# Patient Record
Sex: Male | Born: 1935
Health system: Southern US, Community
[De-identification: ages and names within clinical notes are randomized; demographics above are authoritative.]

## PROBLEM LIST (undated history)

## (undated) DIAGNOSIS — K4090 Unilateral inguinal hernia, without obstruction or gangrene, not specified as recurrent: Secondary | ICD-10-CM

## (undated) DIAGNOSIS — R0600 Dyspnea, unspecified: Secondary | ICD-10-CM

## (undated) DIAGNOSIS — Z8673 Personal history of transient ischemic attack (TIA), and cerebral infarction without residual deficits: Secondary | ICD-10-CM

## (undated) DIAGNOSIS — M353 Polymyalgia rheumatica: Secondary | ICD-10-CM

## (undated) DIAGNOSIS — K219 Gastro-esophageal reflux disease without esophagitis: Secondary | ICD-10-CM

## (undated) DIAGNOSIS — L605 Yellow nail syndrome: Secondary | ICD-10-CM

## (undated) DIAGNOSIS — K635 Polyp of colon: Secondary | ICD-10-CM

## (undated) DIAGNOSIS — I509 Heart failure, unspecified: Secondary | ICD-10-CM

## (undated) DIAGNOSIS — B029 Zoster without complications: Secondary | ICD-10-CM

## (undated) DIAGNOSIS — J3089 Other allergic rhinitis: Secondary | ICD-10-CM

## (undated) DIAGNOSIS — H359 Unspecified retinal disorder: Secondary | ICD-10-CM

## (undated) DIAGNOSIS — Z973 Presence of spectacles and contact lenses: Secondary | ICD-10-CM

## (undated) DIAGNOSIS — M316 Other giant cell arteritis: Secondary | ICD-10-CM

## (undated) DIAGNOSIS — M5416 Radiculopathy, lumbar region: Secondary | ICD-10-CM

## (undated) DIAGNOSIS — M1990 Unspecified osteoarthritis, unspecified site: Secondary | ICD-10-CM

## (undated) DIAGNOSIS — R519 Headache, unspecified: Secondary | ICD-10-CM

## (undated) DIAGNOSIS — R51 Headache: Secondary | ICD-10-CM

## (undated) DIAGNOSIS — J90 Pleural effusion, not elsewhere classified: Secondary | ICD-10-CM

## (undated) DIAGNOSIS — J329 Chronic sinusitis, unspecified: Secondary | ICD-10-CM

## (undated) DIAGNOSIS — L570 Actinic keratosis: Secondary | ICD-10-CM

## (undated) DIAGNOSIS — R609 Edema, unspecified: Secondary | ICD-10-CM

## (undated) DIAGNOSIS — Z87438 Personal history of other diseases of male genital organs: Secondary | ICD-10-CM

## (undated) DIAGNOSIS — H469 Unspecified optic neuritis: Secondary | ICD-10-CM

## (undated) DIAGNOSIS — G2581 Restless legs syndrome: Secondary | ICD-10-CM

## (undated) DIAGNOSIS — Z23 Encounter for immunization: Secondary | ICD-10-CM

## (undated) HISTORY — DX: Polymyalgia rheumatica: M35.3

## (undated) HISTORY — DX: Radiculopathy, lumbar region: M54.16

## (undated) HISTORY — DX: Zoster without complications: B02.9

## (undated) HISTORY — PX: VASECTOMY: SHX75

## (undated) HISTORY — DX: Other allergic rhinitis: J30.89

## (undated) HISTORY — DX: Actinic keratosis: L57.0

## (undated) HISTORY — DX: Dyspnea, unspecified: R06.00

## (undated) HISTORY — DX: Personal history of transient ischemic attack (TIA), and cerebral infarction without residual deficits: Z86.73

## (undated) HISTORY — DX: Yellow nail syndrome: L60.5

## (undated) HISTORY — DX: Unspecified osteoarthritis, unspecified site: M19.90

## (undated) HISTORY — DX: Unspecified retinal disorder: H35.9

## (undated) HISTORY — PX: OTHER SURGICAL HISTORY: SHX169

## (undated) HISTORY — DX: Gastro-esophageal reflux disease without esophagitis: K21.9

## (undated) HISTORY — DX: Restless legs syndrome: G25.81

## (undated) HISTORY — DX: Polyp of colon: K63.5

## (undated) HISTORY — PX: COLONOSCOPY: SHX174

## (undated) HISTORY — DX: Personal history of other diseases of male genital organs: Z87.438

## (undated) HISTORY — DX: Encounter for immunization: Z23

## (undated) HISTORY — DX: Edema, unspecified: R60.9

---
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
## 2005-04-10 ENCOUNTER — Ambulatory Visit (HOSPITAL_COMMUNITY): Admission: RE | Admit: 2005-04-10 | Discharge: 2005-04-10 | Payer: Self-pay | Admitting: Gastroenterology

## 2006-02-04 ENCOUNTER — Encounter: Admission: RE | Admit: 2006-02-04 | Discharge: 2006-02-04 | Payer: Self-pay | Admitting: Internal Medicine

## 2006-10-31 ENCOUNTER — Emergency Department (HOSPITAL_COMMUNITY): Admission: EM | Admit: 2006-10-31 | Discharge: 2006-11-01 | Payer: Self-pay | Admitting: Emergency Medicine

## 2006-11-01 ENCOUNTER — Ambulatory Visit: Admission: RE | Admit: 2006-11-01 | Discharge: 2006-11-01 | Payer: Self-pay | Admitting: Internal Medicine

## 2006-11-01 ENCOUNTER — Ambulatory Visit: Payer: Self-pay | Admitting: Vascular Surgery

## 2006-11-03 ENCOUNTER — Encounter: Admission: RE | Admit: 2006-11-03 | Discharge: 2006-11-03 | Payer: Self-pay | Admitting: Internal Medicine

## 2010-12-12 ENCOUNTER — Other Ambulatory Visit: Payer: Self-pay | Admitting: Gastroenterology

## 2011-11-30 DIAGNOSIS — H31009 Unspecified chorioretinal scars, unspecified eye: Secondary | ICD-10-CM | POA: Diagnosis not present

## 2011-11-30 DIAGNOSIS — H04129 Dry eye syndrome of unspecified lacrimal gland: Secondary | ICD-10-CM | POA: Diagnosis not present

## 2011-11-30 DIAGNOSIS — H02429 Myogenic ptosis of unspecified eyelid: Secondary | ICD-10-CM | POA: Diagnosis not present

## 2012-01-22 DIAGNOSIS — H02409 Unspecified ptosis of unspecified eyelid: Secondary | ICD-10-CM | POA: Diagnosis not present

## 2012-01-22 DIAGNOSIS — Z01810 Encounter for preprocedural cardiovascular examination: Secondary | ICD-10-CM | POA: Diagnosis not present

## 2012-02-05 DIAGNOSIS — H02839 Dermatochalasis of unspecified eye, unspecified eyelid: Secondary | ICD-10-CM | POA: Diagnosis not present

## 2012-02-05 DIAGNOSIS — H02429 Myogenic ptosis of unspecified eyelid: Secondary | ICD-10-CM | POA: Diagnosis not present

## 2012-05-15 DIAGNOSIS — Z23 Encounter for immunization: Secondary | ICD-10-CM | POA: Diagnosis not present

## 2012-06-22 DIAGNOSIS — M5430 Sciatica, unspecified side: Secondary | ICD-10-CM | POA: Diagnosis not present

## 2012-06-22 DIAGNOSIS — J069 Acute upper respiratory infection, unspecified: Secondary | ICD-10-CM | POA: Diagnosis not present

## 2012-07-08 DIAGNOSIS — R059 Cough, unspecified: Secondary | ICD-10-CM | POA: Diagnosis not present

## 2012-07-08 DIAGNOSIS — IMO0001 Reserved for inherently not codable concepts without codable children: Secondary | ICD-10-CM | POA: Diagnosis not present

## 2012-07-08 DIAGNOSIS — R05 Cough: Secondary | ICD-10-CM | POA: Diagnosis not present

## 2012-08-04 DIAGNOSIS — M353 Polymyalgia rheumatica: Secondary | ICD-10-CM | POA: Diagnosis not present

## 2012-08-04 DIAGNOSIS — R05 Cough: Secondary | ICD-10-CM | POA: Diagnosis not present

## 2012-08-04 DIAGNOSIS — R059 Cough, unspecified: Secondary | ICD-10-CM | POA: Diagnosis not present

## 2012-09-15 DIAGNOSIS — J310 Chronic rhinitis: Secondary | ICD-10-CM | POA: Diagnosis not present

## 2012-09-15 DIAGNOSIS — R05 Cough: Secondary | ICD-10-CM | POA: Diagnosis not present

## 2012-09-15 DIAGNOSIS — R059 Cough, unspecified: Secondary | ICD-10-CM | POA: Diagnosis not present

## 2012-09-15 DIAGNOSIS — M353 Polymyalgia rheumatica: Secondary | ICD-10-CM | POA: Diagnosis not present

## 2012-11-10 DIAGNOSIS — M353 Polymyalgia rheumatica: Secondary | ICD-10-CM | POA: Diagnosis not present

## 2012-11-10 DIAGNOSIS — R05 Cough: Secondary | ICD-10-CM | POA: Diagnosis not present

## 2012-11-10 DIAGNOSIS — R5381 Other malaise: Secondary | ICD-10-CM | POA: Diagnosis not present

## 2012-11-10 DIAGNOSIS — J3489 Other specified disorders of nose and nasal sinuses: Secondary | ICD-10-CM | POA: Diagnosis not present

## 2012-11-10 DIAGNOSIS — R059 Cough, unspecified: Secondary | ICD-10-CM | POA: Diagnosis not present

## 2012-11-25 DIAGNOSIS — J309 Allergic rhinitis, unspecified: Secondary | ICD-10-CM | POA: Diagnosis not present

## 2012-11-25 DIAGNOSIS — R499 Unspecified voice and resonance disorder: Secondary | ICD-10-CM | POA: Diagnosis not present

## 2012-11-25 DIAGNOSIS — J329 Chronic sinusitis, unspecified: Secondary | ICD-10-CM | POA: Diagnosis not present

## 2012-12-10 DIAGNOSIS — J329 Chronic sinusitis, unspecified: Secondary | ICD-10-CM | POA: Diagnosis not present

## 2012-12-10 DIAGNOSIS — R079 Chest pain, unspecified: Secondary | ICD-10-CM | POA: Diagnosis not present

## 2012-12-10 DIAGNOSIS — R791 Abnormal coagulation profile: Secondary | ICD-10-CM | POA: Diagnosis not present

## 2012-12-12 DIAGNOSIS — H31009 Unspecified chorioretinal scars, unspecified eye: Secondary | ICD-10-CM | POA: Diagnosis not present

## 2012-12-12 DIAGNOSIS — H04129 Dry eye syndrome of unspecified lacrimal gland: Secondary | ICD-10-CM | POA: Diagnosis not present

## 2012-12-12 DIAGNOSIS — H2510 Age-related nuclear cataract, unspecified eye: Secondary | ICD-10-CM | POA: Diagnosis not present

## 2012-12-22 DIAGNOSIS — J329 Chronic sinusitis, unspecified: Secondary | ICD-10-CM | POA: Diagnosis not present

## 2012-12-22 DIAGNOSIS — M353 Polymyalgia rheumatica: Secondary | ICD-10-CM | POA: Diagnosis not present

## 2012-12-31 DIAGNOSIS — R5381 Other malaise: Secondary | ICD-10-CM | POA: Diagnosis not present

## 2012-12-31 DIAGNOSIS — J329 Chronic sinusitis, unspecified: Secondary | ICD-10-CM | POA: Diagnosis not present

## 2012-12-31 DIAGNOSIS — IMO0002 Reserved for concepts with insufficient information to code with codable children: Secondary | ICD-10-CM | POA: Diagnosis not present

## 2012-12-31 DIAGNOSIS — R5383 Other fatigue: Secondary | ICD-10-CM | POA: Diagnosis not present

## 2012-12-31 DIAGNOSIS — M353 Polymyalgia rheumatica: Secondary | ICD-10-CM | POA: Diagnosis not present

## 2013-01-11 DIAGNOSIS — J019 Acute sinusitis, unspecified: Secondary | ICD-10-CM | POA: Diagnosis not present

## 2013-02-05 DIAGNOSIS — R0602 Shortness of breath: Secondary | ICD-10-CM | POA: Diagnosis not present

## 2013-02-05 DIAGNOSIS — R609 Edema, unspecified: Secondary | ICD-10-CM | POA: Diagnosis not present

## 2013-03-04 DIAGNOSIS — R5383 Other fatigue: Secondary | ICD-10-CM | POA: Diagnosis not present

## 2013-03-04 DIAGNOSIS — M353 Polymyalgia rheumatica: Secondary | ICD-10-CM | POA: Diagnosis not present

## 2013-03-04 DIAGNOSIS — M2550 Pain in unspecified joint: Secondary | ICD-10-CM | POA: Diagnosis not present

## 2013-03-04 DIAGNOSIS — IMO0002 Reserved for concepts with insufficient information to code with codable children: Secondary | ICD-10-CM | POA: Diagnosis not present

## 2013-03-04 DIAGNOSIS — R5381 Other malaise: Secondary | ICD-10-CM | POA: Diagnosis not present

## 2013-04-14 DIAGNOSIS — J310 Chronic rhinitis: Secondary | ICD-10-CM | POA: Diagnosis not present

## 2013-04-14 DIAGNOSIS — E7800 Pure hypercholesterolemia, unspecified: Secondary | ICD-10-CM | POA: Diagnosis not present

## 2013-04-14 DIAGNOSIS — R609 Edema, unspecified: Secondary | ICD-10-CM | POA: Diagnosis not present

## 2013-04-18 DIAGNOSIS — S40019A Contusion of unspecified shoulder, initial encounter: Secondary | ICD-10-CM | POA: Diagnosis not present

## 2013-04-18 DIAGNOSIS — M542 Cervicalgia: Secondary | ICD-10-CM | POA: Diagnosis not present

## 2013-04-18 DIAGNOSIS — M25519 Pain in unspecified shoulder: Secondary | ICD-10-CM | POA: Diagnosis not present

## 2013-04-18 DIAGNOSIS — Z8673 Personal history of transient ischemic attack (TIA), and cerebral infarction without residual deficits: Secondary | ICD-10-CM | POA: Diagnosis not present

## 2013-04-18 DIAGNOSIS — S5010XA Contusion of unspecified forearm, initial encounter: Secondary | ICD-10-CM | POA: Diagnosis not present

## 2013-04-18 DIAGNOSIS — Z7901 Long term (current) use of anticoagulants: Secondary | ICD-10-CM | POA: Diagnosis not present

## 2013-04-18 DIAGNOSIS — IMO0002 Reserved for concepts with insufficient information to code with codable children: Secondary | ICD-10-CM | POA: Diagnosis not present

## 2013-04-18 DIAGNOSIS — S0510XA Contusion of eyeball and orbital tissues, unspecified eye, initial encounter: Secondary | ICD-10-CM | POA: Diagnosis not present

## 2013-04-18 DIAGNOSIS — R918 Other nonspecific abnormal finding of lung field: Secondary | ICD-10-CM | POA: Diagnosis not present

## 2013-04-18 DIAGNOSIS — S61209A Unspecified open wound of unspecified finger without damage to nail, initial encounter: Secondary | ICD-10-CM | POA: Diagnosis not present

## 2013-04-18 DIAGNOSIS — W108XXA Fall (on) (from) other stairs and steps, initial encounter: Secondary | ICD-10-CM | POA: Diagnosis not present

## 2013-04-18 DIAGNOSIS — T1490XA Injury, unspecified, initial encounter: Secondary | ICD-10-CM | POA: Diagnosis not present

## 2013-04-18 DIAGNOSIS — K449 Diaphragmatic hernia without obstruction or gangrene: Secondary | ICD-10-CM | POA: Diagnosis not present

## 2013-04-18 DIAGNOSIS — S0990XA Unspecified injury of head, initial encounter: Secondary | ICD-10-CM | POA: Diagnosis not present

## 2013-05-04 DIAGNOSIS — IMO0002 Reserved for concepts with insufficient information to code with codable children: Secondary | ICD-10-CM | POA: Diagnosis not present

## 2013-05-04 DIAGNOSIS — M353 Polymyalgia rheumatica: Secondary | ICD-10-CM | POA: Diagnosis not present

## 2013-05-04 DIAGNOSIS — R5381 Other malaise: Secondary | ICD-10-CM | POA: Diagnosis not present

## 2013-05-04 DIAGNOSIS — M2550 Pain in unspecified joint: Secondary | ICD-10-CM | POA: Diagnosis not present

## 2013-05-07 DIAGNOSIS — Z4802 Encounter for removal of sutures: Secondary | ICD-10-CM | POA: Diagnosis not present

## 2013-05-13 DIAGNOSIS — Z23 Encounter for immunization: Secondary | ICD-10-CM | POA: Diagnosis not present

## 2013-05-24 DIAGNOSIS — J019 Acute sinusitis, unspecified: Secondary | ICD-10-CM | POA: Diagnosis not present

## 2013-06-22 ENCOUNTER — Other Ambulatory Visit: Payer: Self-pay | Admitting: Internal Medicine

## 2013-06-22 DIAGNOSIS — M353 Polymyalgia rheumatica: Secondary | ICD-10-CM | POA: Diagnosis not present

## 2013-06-22 DIAGNOSIS — I6789 Other cerebrovascular disease: Secondary | ICD-10-CM | POA: Diagnosis not present

## 2013-06-22 DIAGNOSIS — K219 Gastro-esophageal reflux disease without esophagitis: Secondary | ICD-10-CM | POA: Diagnosis not present

## 2013-06-22 DIAGNOSIS — Z79899 Other long term (current) drug therapy: Secondary | ICD-10-CM | POA: Diagnosis not present

## 2013-06-22 DIAGNOSIS — R5381 Other malaise: Secondary | ICD-10-CM | POA: Diagnosis not present

## 2013-06-22 DIAGNOSIS — R05 Cough: Secondary | ICD-10-CM | POA: Diagnosis not present

## 2013-06-22 DIAGNOSIS — R911 Solitary pulmonary nodule: Secondary | ICD-10-CM | POA: Diagnosis not present

## 2013-06-22 DIAGNOSIS — Z0000 Encounter for general adult medical examination without abnormal findings: Secondary | ICD-10-CM | POA: Diagnosis not present

## 2013-06-22 DIAGNOSIS — E7800 Pure hypercholesterolemia, unspecified: Secondary | ICD-10-CM | POA: Diagnosis not present

## 2013-06-22 DIAGNOSIS — J329 Chronic sinusitis, unspecified: Secondary | ICD-10-CM | POA: Diagnosis not present

## 2013-06-22 DIAGNOSIS — R059 Cough, unspecified: Secondary | ICD-10-CM | POA: Diagnosis not present

## 2013-06-23 DIAGNOSIS — M545 Low back pain, unspecified: Secondary | ICD-10-CM | POA: Diagnosis not present

## 2013-06-24 ENCOUNTER — Ambulatory Visit
Admission: RE | Admit: 2013-06-24 | Discharge: 2013-06-24 | Disposition: A | Discharge status: Home / Self Care | Payer: Medicare Other | Source: Ambulatory Visit | Attending: Internal Medicine | Admitting: Internal Medicine

## 2013-06-24 DIAGNOSIS — J984 Other disorders of lung: Secondary | ICD-10-CM | POA: Diagnosis not present

## 2013-06-24 DIAGNOSIS — J8410 Pulmonary fibrosis, unspecified: Secondary | ICD-10-CM | POA: Diagnosis not present

## 2013-06-24 DIAGNOSIS — R911 Solitary pulmonary nodule: Secondary | ICD-10-CM

## 2013-07-07 DIAGNOSIS — J329 Chronic sinusitis, unspecified: Secondary | ICD-10-CM | POA: Diagnosis not present

## 2013-07-07 DIAGNOSIS — K219 Gastro-esophageal reflux disease without esophagitis: Secondary | ICD-10-CM | POA: Diagnosis not present

## 2013-07-11 ENCOUNTER — Encounter: Payer: Self-pay | Admitting: Cardiology

## 2013-07-13 ENCOUNTER — Ambulatory Visit (INDEPENDENT_AMBULATORY_CARE_PROVIDER_SITE_OTHER): Payer: Medicare Other | Admitting: Cardiology

## 2013-07-13 ENCOUNTER — Encounter: Payer: Self-pay | Admitting: Cardiology

## 2013-07-13 VITALS — BP 148/80 | HR 67 | Ht 68.0 in | Wt 163.8 lb

## 2013-07-13 DIAGNOSIS — M353 Polymyalgia rheumatica: Secondary | ICD-10-CM

## 2013-07-13 DIAGNOSIS — Z8673 Personal history of transient ischemic attack (TIA), and cerebral infarction without residual deficits: Secondary | ICD-10-CM

## 2013-07-13 DIAGNOSIS — I700 Atherosclerosis of aorta: Secondary | ICD-10-CM | POA: Diagnosis not present

## 2013-07-13 DIAGNOSIS — R0600 Dyspnea, unspecified: Secondary | ICD-10-CM

## 2013-07-13 DIAGNOSIS — R609 Edema, unspecified: Secondary | ICD-10-CM | POA: Diagnosis not present

## 2013-07-13 DIAGNOSIS — R0609 Other forms of dyspnea: Secondary | ICD-10-CM | POA: Diagnosis not present

## 2013-07-13 DIAGNOSIS — R0602 Shortness of breath: Secondary | ICD-10-CM | POA: Insufficient documentation

## 2013-07-13 HISTORY — DX: Personal history of transient ischemic attack (TIA), and cerebral infarction without residual deficits: Z86.73

## 2013-07-13 HISTORY — DX: Polymyalgia rheumatica: M35.3

## 2013-07-13 HISTORY — DX: Edema, unspecified: R60.9

## 2013-07-13 HISTORY — DX: Dyspnea, unspecified: R06.00

## 2013-07-13 NOTE — Patient Instructions (Addendum)
Your physician recommends that you continue on your current medications as directed. Please refer to the Current Medication list given to you today.  Your physician has requested that you have an echocardiogram. Echocardiography is a painless test that uses sound waves to create images of your heart. It provides your doctor with information about the size and shape of your heart and how well your heart's chambers and valves are working. This procedure takes approximately one hour. There are no restrictions for this procedure.  Your physician has requested that you have en exercise stress myoview. For further information please visit [email or web address] Please follow instruction sheet, as given.  Your physician recommends that you schedule a follow-up appointment in: 1 month with Dr. Anne Fu

## 2013-07-13 NOTE — Progress Notes (Signed)
[address] [place], [place]  [address] Phone: [phone] Fax:  [phone]  Date:  07/13/2013   [name], DOB 1931/09/10, MRN [id number]  PCP:  No primary provider on file.   History of Present Illness: Stuart Watson is a 79 y.o. male here for the evaluation of shortness of breath at the request of Dr. Earl Gala and he's been having problems with chronic cough, fatigue, dyspnea on exertion when climbing steps. Diagnosed with PMR in 12/13 (Prednisone). Energy loss.  He does walk 6-8 miles weekly without any difficulty. In 2014, mid, developed edema which improved with furosemide he takes on an as-needed basis. Edema improves throughout the day. Likely dependent. No more than 2 per week. Weight can increase through day (3 pound gain). No Chest pain. BNP were normal at the time and it was thought to be secondary to prednisone. A small lung nodule was noted on CT scan which will be repeated. Otherwise unremarkable. Prior smoker. Currently quit. Hemoglobin 14.2. Creatinine 0.9, TSH 1.1, LDL 68.  TIA previously. 6 years ago had heart testing. Aortic atherosclerosis noted. Coronary calcium was noted on CT scan, appeared quite extensive.      Wt Readings from Last 3 Encounters:  07/13/13 163 lb 12.8 oz (74.299 kg)     Past Medical History  Diagnosis Date  . History of BPH   . Perennial allergic rhinitis   . Restless leg syndrome   . Lumbar radiculopathy   . GERD (gastroesophageal reflux disease)     omeprazole 1-2 times daily  . Actinic keratoses   . H/O TIA (transient ischemic attack) and stroke     on statins and plavix -saw Dr.Sethi in the past  . Retinal disease     right eye since birth   . Need for shingles vaccine   . Shingles   . Colon polyps     Past Surgical History  Procedure Laterality Date  . Vasectomy    . Colonoscopy      Current Outpatient Prescriptions  Medication Sig Dispense Refill  . acetaminophen (TYLENOL) 325 MG tablet  Take 650 mg by mouth every 6 (six) hours as needed.      . clopidogrel (PLAVIX) 75 MG tablet Take 75 mg by mouth daily with breakfast.      . DiphenhydrAMINE HCl (BENADRYL PO) Take by mouth as needed.      . doxazosin (CARDURA) 8 MG tablet Take 8 mg by mouth daily.      . fluticasone (VERAMYST) 27.5 MCG/SPRAY nasal spray Place 2 sprays into the nose daily.      . furosemide (LASIX) 20 MG tablet Take 20 mg by mouth as needed.       . Multiple Vitamin (MULTIVITAMIN) tablet Take 1 tablet by mouth daily.      Marland Kitchen omeprazole (PRILOSEC) 20 MG capsule Take 20 mg by mouth daily.      Marland Kitchen PREDNISONE PO Take 7 mg by mouth as directed.      . simvastatin (ZOCOR) 20 MG tablet Take 20 mg by mouth daily.       No current facility-administered medications for this visit.    Allergies:   No Known Allergies  Social History:  The patient  reports that he quit smoking about 38 years ago. His smoking use included Cigarettes. He smoked 0.00 packs per day for 15 years. He has never used smokeless tobacco. He reports that he drinks about 4.2 ounces  of alcohol per week. He reports that he does not use illicit drugs.   Family History  Problem Relation Age of Onset  . Stroke Mother   . Coronary artery disease Mother   . Diabetes Mother   . Breast cancer Mother   . Heart attack Father   . Coronary artery disease Father   . Graves' disease Sister     ROS:  Please see the history of present illness.   Denies any bleeding, syncope, orthopnea, PND, fevers, strokelike symptoms, rash.   All other systems reviewed and negative.   PHYSICAL EXAM: VS:  BP 148/80  Pulse 67  Ht 5\' 8"  (1.727 m)  Wt 163 lb 12.8 oz (74.299 kg)  BMI 24.91 kg/m2 Well nourished, well developed, in no acute distress HEENT: normal, [place]/AT, EOMI Neck: no JVD, normal carotid upstroke, no bruit Cardiac:  normal S1, S2; RRR; no murmur Lungs:  clear to auscultation bilaterally, no wheezing, rhonchi or rales Abd: soft, nontender, no hepatomegaly,  no bruits Ext: 1-2+ edema, 2+ distal pulses Skin: warm and dry GU: deferred Neuro: no focal abnormalities noted, AAO x 3  EKG:  Sinus rhythm rate 67 with PAC, no other ST segment changes     ASSESSMENT AND PLAN:  1. Dyspnea on exertion - with coronary artery calcification, prior TIA, age, I would like to proceed with nuclear stress test to ensure that his dyspnea is not an anginal equivalent. I will also check an echocardiogram to ensure proper structure and function of his heart. Continue with aggressive risk factor prevention. 2. Edema-could be secondary to prednisone in part. Prior BNP was normal. Recommended compression stockings. I also recommended that he go ahead and take his Lasix on a daily basis for the next several days. 3. Polymyalgia rheumatica-decreasing prednisone dose by Dr. Earl Gala. Certainly could also be playing a role in his symptoms. 4. Coronary artery calcifications-personally viewed CT scan. Currently on statin, Plavix. Checking stress test to ensure that there is no flow limitation. 5. Aortic atherosclerosis-continue with secondary prevention. 6. History of TIA-Plavix  Signed, Donato Schultz, MD [facility]  07/13/2013 11:15 AM

## 2013-07-27 ENCOUNTER — Encounter: Payer: Self-pay | Admitting: Cardiovascular Disease

## 2013-07-27 ENCOUNTER — Ambulatory Visit (HOSPITAL_COMMUNITY): Payer: Medicare Other | Attending: Cardiology | Admitting: Radiology

## 2013-07-27 DIAGNOSIS — I700 Atherosclerosis of aorta: Secondary | ICD-10-CM | POA: Insufficient documentation

## 2013-07-27 DIAGNOSIS — I079 Rheumatic tricuspid valve disease, unspecified: Secondary | ICD-10-CM | POA: Diagnosis not present

## 2013-07-27 DIAGNOSIS — R5381 Other malaise: Secondary | ICD-10-CM | POA: Diagnosis not present

## 2013-07-27 DIAGNOSIS — R0602 Shortness of breath: Secondary | ICD-10-CM | POA: Diagnosis not present

## 2013-07-27 DIAGNOSIS — R0609 Other forms of dyspnea: Secondary | ICD-10-CM | POA: Insufficient documentation

## 2013-07-27 DIAGNOSIS — I059 Rheumatic mitral valve disease, unspecified: Secondary | ICD-10-CM | POA: Diagnosis not present

## 2013-07-27 DIAGNOSIS — R0989 Other specified symptoms and signs involving the circulatory and respiratory systems: Secondary | ICD-10-CM | POA: Insufficient documentation

## 2013-07-27 DIAGNOSIS — R5383 Other fatigue: Secondary | ICD-10-CM

## 2013-07-27 DIAGNOSIS — R0600 Dyspnea, unspecified: Secondary | ICD-10-CM

## 2013-07-27 DIAGNOSIS — R05 Cough: Secondary | ICD-10-CM | POA: Insufficient documentation

## 2013-07-27 DIAGNOSIS — Z8673 Personal history of transient ischemic attack (TIA), and cerebral infarction without residual deficits: Secondary | ICD-10-CM | POA: Insufficient documentation

## 2013-07-27 DIAGNOSIS — R059 Cough, unspecified: Secondary | ICD-10-CM | POA: Insufficient documentation

## 2013-07-27 DIAGNOSIS — R609 Edema, unspecified: Secondary | ICD-10-CM | POA: Diagnosis not present

## 2013-07-27 NOTE — Progress Notes (Signed)
Echocardiogram performed.  

## 2013-07-28 MED ORDER — MIDAZOLAM HCL 5 MG/ML IJ SOLN
INTRAMUSCULAR | Status: AC
  Filled 2013-07-28: qty 2

## 2013-07-28 MED ORDER — FENTANYL CITRATE 0.05 MG/ML IJ SOLN
INTRAMUSCULAR | Status: AC
  Filled 2013-07-28: qty 2

## 2013-07-29 ENCOUNTER — Encounter: Payer: Self-pay | Admitting: Cardiovascular Disease

## 2013-07-29 ENCOUNTER — Ambulatory Visit (HOSPITAL_COMMUNITY): Payer: Medicare Other | Attending: Cardiology | Admitting: Radiology

## 2013-07-29 ENCOUNTER — Telehealth: Payer: Self-pay | Admitting: Cardiology

## 2013-07-29 ENCOUNTER — Telehealth: Payer: Self-pay | Admitting: *Deleted

## 2013-07-29 VITALS — BP 135/68 | HR 75 | Ht 68.0 in | Wt 159.0 lb

## 2013-07-29 DIAGNOSIS — R0609 Other forms of dyspnea: Secondary | ICD-10-CM | POA: Insufficient documentation

## 2013-07-29 DIAGNOSIS — I2510 Atherosclerotic heart disease of native coronary artery without angina pectoris: Secondary | ICD-10-CM | POA: Diagnosis not present

## 2013-07-29 DIAGNOSIS — R5381 Other malaise: Secondary | ICD-10-CM | POA: Insufficient documentation

## 2013-07-29 DIAGNOSIS — R0989 Other specified symptoms and signs involving the circulatory and respiratory systems: Secondary | ICD-10-CM | POA: Diagnosis not present

## 2013-07-29 DIAGNOSIS — I739 Peripheral vascular disease, unspecified: Secondary | ICD-10-CM | POA: Diagnosis not present

## 2013-07-29 DIAGNOSIS — Z87891 Personal history of nicotine dependence: Secondary | ICD-10-CM | POA: Insufficient documentation

## 2013-07-29 DIAGNOSIS — R0600 Dyspnea, unspecified: Secondary | ICD-10-CM

## 2013-07-29 DIAGNOSIS — R0602 Shortness of breath: Secondary | ICD-10-CM | POA: Diagnosis not present

## 2013-07-29 DIAGNOSIS — R5383 Other fatigue: Secondary | ICD-10-CM

## 2013-07-29 MED ORDER — TECHNETIUM TC 99M SESTAMIBI GENERIC - CARDIOLITE
11.0000 | Freq: Once | INTRAVENOUS | Status: AC | PRN
  Administered 2013-07-29: 11 via INTRAVENOUS

## 2013-07-29 MED ORDER — TECHNETIUM TC 99M SESTAMIBI GENERIC - CARDIOLITE
33.0000 | Freq: Once | INTRAVENOUS | Status: AC | PRN
  Administered 2013-07-29: 33 via INTRAVENOUS

## 2013-07-29 NOTE — Telephone Encounter (Signed)
New problem:  Pt is calling to hear test results.

## 2013-07-29 NOTE — Progress Notes (Signed)
[facility] [address] [place], [place] [address] [phone]    Cardiology Nuclear Med Study  KRISTY CATOE is a 80 y.o. male     MRN : [id number]     DOB: Jan 13, 1932  Procedure Date: 07/29/2013  Nuclear Med Background Indication for Stress Test:  Evaluation for Ischemia History:  CAD, MPI 2008 (normal), Cardiac CT (calcium quite extensive) Cardiac Risk Factors: Family History - CAD, History of Smoking, Lipids and PVD  Symptoms:  DOE, Fatigue and SOB   Nuclear Pre-Procedure Caffeine/Decaff Intake:  None NPO After: 7:00am   Lungs:  clear O2 Sat: 95% on room air. IV 0.9% NS with Angio Cath:  22g  IV Site: R Hand  IV Started by:  Crissie Figures, RN  Chest Size (in):  46 Cup Size: n/a  Height: 5\' 8"  (1.727 m)  Weight:  159 lb (72.122 kg)  BMI:  Body mass index is 24.18 kg/(m^2). Tech Comments:  N/A    Nuclear Med Study 1 or 2 day study: 1 day  Stress Test Type:  Stress  Reading MD: N/A  Order Authorizing Provider:  Candee Furbish, MD  Resting Radionuclide: Technetium 40m Sestamibi  Resting Radionuclide Dose: 11.0 mCi   Stress Radionuclide:  Technetium 7m Sestamibi  Stress Radionuclide Dose: 33.0 mCi           Stress Protocol Rest HR: 75 Stress HR: 142  Rest BP: 135/68 Stress BP: 186/73  Exercise Time (min): 5:00 METS: 7.0           Dose of Adenosine (mg):  n/a Dose of Lexiscan: n/a mg  Dose of Atropine (mg): n/a Dose of Dobutamine: n/a mcg/kg/min (at max HR)  Stress Test Technologist: Glade Lloyd, BS-ES  Nuclear Technologist:  Charlton Amor, CNMT     Rest Procedure:  Myocardial perfusion imaging was performed at rest 45 minutes following the intravenous administration of Technetium 42m Sestamibi. Rest ECG: NSR - Normal EKG  Stress Procedure:  The patient exercised on the treadmill utilizing the Bruce Protocol for 5:00 minutes. The patient stopped due to SOB and denied any chest pain.  Technetium 37m Sestamibi was injected at peak  exercise and myocardial perfusion imaging was performed after a brief delay. Stress ECG: No significant change from baseline ECG  QPS Raw Data Images:  Normal; no motion artifact; normal heart/lung ratio. Stress Images:  Normal homogeneous uptake in all areas of the myocardium. Rest Images:  Normal homogeneous uptake in all areas of the myocardium. Subtraction (SDS):  Normal Transient Ischemic Dilatation (Normal <1.22):  0.89 Lung/Heart Ratio (Normal <0.45):  0.36  Quantitative Gated Spect Images QGS EDV:  67 ml QGS ESV:  20 ml  Impression Exercise Capacity:  Fair exercise capacity. BP Response:  Normal blood pressure response. Clinical Symptoms:  There is dyspnea. ECG Impression:  No significant ST segment change suggestive of ischemia. Comparison with Prior Nuclear Study: No images to compare  Overall Impression:  Normal stress nuclear study.  LV Ejection Fraction: 70%.  LV Wall Motion:  NL LV Function; NL Wall Motion   Jenkins Rouge

## 2013-07-29 NOTE — Telephone Encounter (Signed)
lmtcb for results. Number provided  

## 2013-08-04 DIAGNOSIS — M353 Polymyalgia rheumatica: Secondary | ICD-10-CM | POA: Diagnosis not present

## 2013-08-04 DIAGNOSIS — M2550 Pain in unspecified joint: Secondary | ICD-10-CM | POA: Diagnosis not present

## 2013-08-04 DIAGNOSIS — R5381 Other malaise: Secondary | ICD-10-CM | POA: Diagnosis not present

## 2013-08-04 DIAGNOSIS — R5383 Other fatigue: Secondary | ICD-10-CM | POA: Diagnosis not present

## 2013-08-04 DIAGNOSIS — IMO0002 Reserved for concepts with insufficient information to code with codable children: Secondary | ICD-10-CM | POA: Diagnosis not present

## 2013-08-18 ENCOUNTER — Ambulatory Visit (INDEPENDENT_AMBULATORY_CARE_PROVIDER_SITE_OTHER): Payer: Medicare Other | Admitting: Cardiology

## 2013-08-18 ENCOUNTER — Encounter: Payer: Self-pay | Admitting: Cardiology

## 2013-08-18 VITALS — BP 112/58 | HR 81 | Ht 68.0 in | Wt 164.1 lb

## 2013-08-18 DIAGNOSIS — I2584 Coronary atherosclerosis due to calcified coronary lesion: Secondary | ICD-10-CM

## 2013-08-18 DIAGNOSIS — R0600 Dyspnea, unspecified: Secondary | ICD-10-CM

## 2013-08-18 DIAGNOSIS — M353 Polymyalgia rheumatica: Secondary | ICD-10-CM

## 2013-08-18 DIAGNOSIS — R0609 Other forms of dyspnea: Secondary | ICD-10-CM | POA: Diagnosis not present

## 2013-08-18 DIAGNOSIS — I700 Atherosclerosis of aorta: Secondary | ICD-10-CM

## 2013-08-18 DIAGNOSIS — I2510 Atherosclerotic heart disease of native coronary artery without angina pectoris: Secondary | ICD-10-CM | POA: Diagnosis not present

## 2013-08-18 DIAGNOSIS — R609 Edema, unspecified: Secondary | ICD-10-CM

## 2013-08-18 DIAGNOSIS — Z8673 Personal history of transient ischemic attack (TIA), and cerebral infarction without residual deficits: Secondary | ICD-10-CM

## 2013-08-18 DIAGNOSIS — R0989 Other specified symptoms and signs involving the circulatory and respiratory systems: Secondary | ICD-10-CM

## 2013-08-18 NOTE — Progress Notes (Signed)
[facility] [address] [place], [place]  [address] Phone: [phone] Fax:  [phone]  Date:  08/18/2013   [name], DOB [birth year], MRN [id number]  PCP:  Horton Finer, MD   History of Present Illness: Stuart Watson is a 80 y.o. male here for follow up from the evaluation of shortness of breath at the request of Dr. Maxwell Caul and he's been having problems with chronic cough, fatigue, dyspnea on exertion when climbing steps. Diagnosed with PMR in 12/13 (Prednisone). Energy loss.  He does walk 6-8 miles weekly without any difficulty. In 2014, mid, developed edema which improved with furosemide he takes on an as-needed basis. Edema improves throughout the day. Likely dependent. No more than 2 per week. Weight can increase through day (3 pound gain). No Chest pain. BNP were normal at the time and it was thought to be secondary to prednisone. A small lung nodule was noted on CT scan which will be repeated. Otherwise unremarkable. Prior smoker. Currently quit. Hemoglobin 14.2. Creatinine 0.9, TSH 1.1, LDL 68.  TIA previously. 6 years ago had heart testing. Aortic atherosclerosis noted. Coronary calcium was noted on CT scan, appeared quite extensive.   Echo: 07/27/13: - Left ventricle: The cavity size was normal. Systolic function was normal. The estimated ejection fraction was in the range of 60% to 65%. Wall motion was normal; there were no regional wall motion abnormalities. - Mitral valve: Mild regurgitation. - Left atrium: The atrium was mildly dilated. - Atrial septum: No defect or patent foramen ovale was identified.  NUC stress 07/30/13: - Low risk, no ischemia, normal EF 70%     Wt Readings from Last 3 Encounters:  08/18/13 164 lb 1.9 oz (74.444 kg)  07/29/13 159 lb (72.122 kg)  07/13/13 163 lb 12.8 oz (74.299 kg)     Past Medical History  Diagnosis Date  . History of BPH   . Perennial allergic rhinitis   . Restless leg syndrome   . Lumbar  radiculopathy   . GERD (gastroesophageal reflux disease)     omeprazole 1-2 times daily  . Actinic keratoses   . H/O TIA (transient ischemic attack) and stroke     on statins and plavix -saw Dr.Sethi in the past  . Retinal disease     right eye since birth   . Need for shingles vaccine   . Shingles   . Colon polyps   . Dyspnea 07/13/2013    BNP normal  . History of TIA (transient ischemic attack) 07/13/2013  . Edema 07/13/2013  . Polymyalgia rheumatica 07/13/2013    Daily prednisone    Past Surgical History  Procedure Laterality Date  . Vasectomy    . Colonoscopy      Current Outpatient Prescriptions  Medication Sig Dispense Refill  . acetaminophen (TYLENOL) 325 MG tablet Take 650 mg by mouth every 6 (six) hours as needed.      . clopidogrel (PLAVIX) 75 MG tablet Take 75 mg by mouth daily with breakfast.      . DiphenhydrAMINE HCl (BENADRYL PO) Take by mouth as needed.      . doxazosin (CARDURA) 8 MG tablet Take 8 mg by mouth daily.      . fluticasone (VERAMYST) 27.5 MCG/SPRAY nasal spray Place 2 sprays into the nose daily.      . furosemide (LASIX) 20 MG tablet Take 20 mg by mouth as needed.       . Multiple Vitamin (MULTIVITAMIN) tablet  Take 1 tablet by mouth daily.      Marland Kitchen omeprazole (PRILOSEC) 20 MG capsule Take 20 mg by mouth daily.      Marland Kitchen PREDNISONE PO Take 7 mg by mouth as directed.      . simvastatin (ZOCOR) 20 MG tablet Take 20 mg by mouth daily.       No current facility-administered medications for this visit.    Allergies:   No Known Allergies  Social History:  The patient  reports that he quit smoking about 38 years ago. His smoking use included Cigarettes. He smoked 0.00 packs per day for 15 years. He has never used smokeless tobacco. He reports that he drinks about 4.2 ounces of alcohol per week. He reports that he does not use illicit drugs.   Family History  Problem Relation Age of Onset  . Stroke Mother   . Coronary artery disease Mother   . Diabetes  Mother   . Breast cancer Mother   . Heart attack Father   . Coronary artery disease Father   . Graves' disease Sister     ROS:  Please see the history of present illness.   Denies any bleeding, syncope, orthopnea, PND, fevers, strokelike symptoms, rash.   All other systems reviewed and negative.   PHYSICAL EXAM: VS:  BP 112/58  Pulse 81  Ht 5\' 8"  (1.727 m)  Wt 164 lb 1.9 oz (74.444 kg)  BMI 24.96 kg/m2  SpO2 96% Well nourished, well developed, in no acute distress HEENT: normal, [place]/AT, EOMI Neck: no JVD, normal carotid upstroke, no bruit Cardiac:  normal S1, S2; RRR; 2/6 apex murmur Lungs:  clear to auscultation bilaterally, no wheezing, rhonchi or rales Abd: soft, nontender, no hepatomegaly, no bruits Ext: 1-2+ edema, 2+ distal pulses Skin: warm and dry GU: deferred Neuro: no focal abnormalities noted, AAO x 3  EKG:  Sinus rhythm rate 67 with PAC, no other ST segment changes     ASSESSMENT AND PLAN:  1. Dyspnea on exertion - with reassuring nuclear stress test as well as echocardiogram. Normal ejection fraction. No evidence of ischemia despite coronary calcification. Overall low risk. Continue with aggressive risk factor prevention. 2. Edema-could be secondary to prednisone in part. Prior BNP was normal. Recommended compression stockings. I also recommended that he go ahead and take his Lasix on a daily  basis. Dr. Maxwell Caul to follow labs. 3. Polymyalgia rheumatica-decreasing prednisone dose by Dr. Maxwell Caul. Certainly could also be playing a role in his symptoms. 4. Coronary artery calcifications-personally viewed CT scan. Currently on statin, Plavix. Stress test reassuring with no flow limitation.  5. Aortic atherosclerosis-continue with secondary prevention. Could consider higher dose statin to 40 mg if LDL is not at 70. 6. Mild mitral regurgitation-should be of little medical consequence. Continue with blood pressure, lipid control. 7. History of TIA-Plavix 8. I will see back  on as-needed basis. Reassuring cardiac workup.  Signed, Candee Furbish, MD [facility]  08/18/2013 9:55 AM

## 2013-08-18 NOTE — Patient Instructions (Signed)
Your physician recommends that you continue on your current medications as directed. Please refer to the Current Medication list given to you today.  Your physician recommends that you follow-up as needed.  

## 2013-08-24 DIAGNOSIS — D2330 Other benign neoplasm of skin of unspecified part of face: Secondary | ICD-10-CM | POA: Diagnosis not present

## 2013-08-25 DIAGNOSIS — J329 Chronic sinusitis, unspecified: Secondary | ICD-10-CM | POA: Diagnosis not present

## 2013-09-28 DIAGNOSIS — L905 Scar conditions and fibrosis of skin: Secondary | ICD-10-CM | POA: Diagnosis not present

## 2013-10-06 DIAGNOSIS — H6120 Impacted cerumen, unspecified ear: Secondary | ICD-10-CM | POA: Diagnosis not present

## 2013-10-06 DIAGNOSIS — J309 Allergic rhinitis, unspecified: Secondary | ICD-10-CM | POA: Diagnosis not present

## 2013-10-06 DIAGNOSIS — J329 Chronic sinusitis, unspecified: Secondary | ICD-10-CM | POA: Diagnosis not present

## 2013-10-06 DIAGNOSIS — K219 Gastro-esophageal reflux disease without esophagitis: Secondary | ICD-10-CM | POA: Diagnosis not present

## 2013-10-08 ENCOUNTER — Other Ambulatory Visit: Payer: Self-pay | Admitting: Internal Medicine

## 2013-10-08 DIAGNOSIS — R9389 Abnormal findings on diagnostic imaging of other specified body structures: Secondary | ICD-10-CM | POA: Diagnosis not present

## 2013-10-08 DIAGNOSIS — R05 Cough: Secondary | ICD-10-CM | POA: Diagnosis not present

## 2013-10-08 DIAGNOSIS — J329 Chronic sinusitis, unspecified: Secondary | ICD-10-CM | POA: Diagnosis not present

## 2013-10-08 DIAGNOSIS — R059 Cough, unspecified: Secondary | ICD-10-CM | POA: Diagnosis not present

## 2013-10-08 DIAGNOSIS — R911 Solitary pulmonary nodule: Secondary | ICD-10-CM

## 2013-11-02 DIAGNOSIS — R5381 Other malaise: Secondary | ICD-10-CM | POA: Diagnosis not present

## 2013-11-02 DIAGNOSIS — M2550 Pain in unspecified joint: Secondary | ICD-10-CM | POA: Diagnosis not present

## 2013-11-02 DIAGNOSIS — R5383 Other fatigue: Secondary | ICD-10-CM | POA: Diagnosis not present

## 2013-11-02 DIAGNOSIS — IMO0002 Reserved for concepts with insufficient information to code with codable children: Secondary | ICD-10-CM | POA: Diagnosis not present

## 2013-11-02 DIAGNOSIS — M353 Polymyalgia rheumatica: Secondary | ICD-10-CM | POA: Diagnosis not present

## 2013-11-10 DIAGNOSIS — L723 Sebaceous cyst: Secondary | ICD-10-CM | POA: Diagnosis not present

## 2013-11-19 ENCOUNTER — Ambulatory Visit (INDEPENDENT_AMBULATORY_CARE_PROVIDER_SITE_OTHER): Payer: PRIVATE HEALTH INSURANCE | Admitting: General Surgery

## 2013-12-02 ENCOUNTER — Encounter (INDEPENDENT_AMBULATORY_CARE_PROVIDER_SITE_OTHER): Payer: Self-pay | Admitting: General Surgery

## 2013-12-02 ENCOUNTER — Ambulatory Visit (INDEPENDENT_AMBULATORY_CARE_PROVIDER_SITE_OTHER): Payer: Medicare Other | Admitting: General Surgery

## 2013-12-02 VITALS — BP 125/80 | HR 65 | Temp 97.8°F | Resp 16 | Ht 68.0 in | Wt 158.2 lb

## 2013-12-02 DIAGNOSIS — K610 Anal abscess: Secondary | ICD-10-CM

## 2013-12-02 DIAGNOSIS — K612 Anorectal abscess: Secondary | ICD-10-CM | POA: Diagnosis not present

## 2013-12-02 NOTE — Patient Instructions (Signed)
Hold Plavix 7 days prior to procedure.    Anal Abscess/Fistula  What is an anal abscess? An anal abscess is an infected cavity filled with pus found near the anus or rectum. What is an anal fistula? An anal fistula (also called fistula-in-ano) is frequently the result of a previous or current anal abscess, occurring in up to 50% of patients with abscesses. Normal anatomy includes small glands just inside the anus. Occasionally, these glands get clogged and potentially can become infected, leading to an abscess. The fistula is a tunnel that forms under the skin and connects the infected glands to the abscess. A fistula can be present with or without an abscess and may connect just to the skin of the buttocks near the anal opening. Other situations that can result in a fistula include Crohn's disease, radiation, trauma and malignancy. How does someone get an anal abscess or a fistula? The abscess is most often a result of an acute infection in the internal glands of the anus. Occasionally, bacteria, fecal material or foreign matter can clog the anal gland and create a condition for an abscess cavity to form. Other medical conditions can make these types of infections more likely.  After an abscess drains on its own or has been drained (opened), a tunnel (fistula) may persist, connecting the infected anal gland to the external skin. This typically will involve some type of drainage from the external opening and occurs in up to 50% of abscesses. If the opening on the skin heals when a fistula is present, a recurrent abscess may develop. What are the specific signs or symptoms of an abscess or fistula? A patient with an abscess may have pain, redness or swelling in the area around the anal area. Fatigue, general malaise, as well as accompanying fever or chills are also common. Similar signs and symptoms may be present when patients have a fistula, with the addition of possible irritation of the perianal skin  or drainage from an external opening. Is any specific testing necessary to diagnose an abscess or fistula? No. Most anal abscesses or fistula-in-ano are diagnosed and managed on the basis of clinical findings. Occasionally, additional studies such as ultrasound, CT scan, or MRI can assist with the diagnosis of deeper abscesses or the delineation of the fistula tunnel to help guide treatment.  What is the treatment of an anal abscess? The treatment of an abscess is surgical drainage under most circumstances. An incision is made in the skin near the anus to drain the infection. This can be done in a doctor's office with local anesthetic or in an operating room under deeper anesthesia. Hospitalization may be required for patients prone to more significant infections such as diabetics or patients with decreased immunity. Are antibiotics required to treat this type of infection? Antibiotics alone are a poor alternative to drainage of the infection. For uncomplicated abscesses, the addition of antibiotics to surgical drainage does not improve healing time or reduce the potential for recurrences. There are some conditions in which antibiotics are indicated, such as for patients with compromised or altered immunity, some cardiac valvular conditions or extensive cellulitis. A comprehensive discussion of your past medical history and a physical exam are important to determine if antibiotics are indicated. What is the treatment of an anal fistula? Surgery is almost always necessary to cure an anal fistula. Although surgery can be fairly straightforward, it may also be complicated, occasionally requiring staged or multiple operations. Consider identifying a specialist in colon and rectal surgery  who would be familiar with a number of potential operations to treat the fistula. The surgery may be performed at the same time as drainage of an abscess, although sometimes the fistula doesn't appear until weeks to years after  the initial drainage. If the fistula is straightforward, a fistulotomy may be performed. This procedure involves connecting the internal opening within the anal canal to the external opening, creating a groove that will heal from the inside out. This surgery often will require dividing a small portion of the sphincter muscle which has the unlikely potential for affecting the control of bowel movements in a limited number of cases.  Other procedures include placing material within the fistula tract to occlude it or surgically altering the surrounding tissue to accomplish closure of the fistula, with the choice of procedure depending upon the type, length, and location of the fistula. Most of the operations can be performed on an outpatient basis, but may occasionally require hospitalization. What is the recovery like from surgery? Pain after surgery is controlled with pain pills, fiber and bulk laxatives. Patients should plan for time at home using sitz baths and attempt to avoid the constipation that can be associated with prescription pain medication. Discuss with your surgeon the specific care and time away from work prior to surgery to prepare yourself for post-operative care. Can the abscess or fistula recur? If adequately treated and properly healed, both are unlikely to return. However, despite proper and indicated open or minimally invasive treatment, both abscesses and fistulas can potentially recur. Should similar symptoms arise, suggesting recurrence, it is recommended that you find a colon and rectal surgeon to manage your condition. What is a colon and rectal surgeon? Colon and rectal surgeons are experts in the surgical and non-surgical treatment of diseases of the colon, rectum and anus. They have completed advanced surgical training in the treatment of these diseases as well as full general surgical training. Board-certified colon and rectal surgeons complete residencies in general surgery and  colon and rectal surgery, and pass intensive examinations conducted by the American Board of Surgery and the American Board of Colon and Rectal Surgery. They are well-versed in the treatment of both benign and malignant diseases of the colon, rectum and anus and are able to perform routine screening examinations and surgically treat conditions if indicated to do so.  author: Brien Mates, MD, FACS, FASCRS, on behalf of the [place]  2012 American Society of Colon & Rectal Surgeons

## 2013-12-02 NOTE — Progress Notes (Signed)
Chief Complaint  Patient presents with  . Abscess    HISTORY: Stuart Watson is a 80 y.o. male who presents to the office with a mass that is enlarging.  Other symptoms include pain.  This had been occurring for about a year.   His bowel habits are regular and his bowel movements are soft.  His fiber intake is dietary.  His last colonoscopy was about 2-3 yrs ago.  He has had some polyps removed in the past.  He denies any fecal leakage or loose stools.  No personal or family h/o IBD.  He has had no anorectal procedures in the past.    Past Medical History  Diagnosis Date  . History of BPH   . Perennial allergic rhinitis   . Restless leg syndrome   . Lumbar radiculopathy   . GERD (gastroesophageal reflux disease)     omeprazole 1-2 times daily  . Actinic keratoses   . H/O TIA (transient ischemic attack) and stroke     on statins and plavix -saw Dr.Sethi in the past  . Retinal disease     right eye since birth   . Need for shingles vaccine   . Shingles   . Colon polyps   . Dyspnea 07/13/2013    BNP normal  . History of TIA (transient ischemic attack) 07/13/2013  . Edema 07/13/2013  . Polymyalgia rheumatica 07/13/2013    Daily prednisone  . Arthritis       Past Surgical History  Procedure Laterality Date  . Vasectomy    . Colonoscopy          Current Outpatient Prescriptions  Medication Sig Dispense Refill  . acetaminophen (TYLENOL) 325 MG tablet Take 650 mg by mouth every 6 (six) hours as needed.      . clopidogrel (PLAVIX) 75 MG tablet Take 75 mg by mouth daily with breakfast.      . doxazosin (CARDURA) 8 MG tablet Take 8 mg by mouth daily.      . fluticasone (VERAMYST) 27.5 MCG/SPRAY nasal spray Place 2 sprays into the nose daily.      . furosemide (LASIX) 20 MG tablet Take 20 mg by mouth as needed.       . Multiple Vitamin (MULTIVITAMIN) tablet Take 1 tablet by mouth daily.      Marland Kitchen omeprazole (PRILOSEC) 20 MG capsule Take 20 mg by mouth daily.      Marland Kitchen PREDNISONE PO Take  7 mg by mouth as directed.      . simvastatin (ZOCOR) 20 MG tablet Take 20 mg by mouth daily.      . traMADol (ULTRAM) 50 MG tablet        No current facility-administered medications for this visit.      No Known Allergies    Family History  Problem Relation Age of Onset  . Stroke Mother   . Coronary artery disease Mother   . Diabetes Mother   . Breast cancer Mother   . Heart attack Father   . Coronary artery disease Father   . Graves' disease Sister     History   Social History  . Marital Status: Married    Spouse Name: N/A    Number of Children: N/A  . Years of Education: N/A   Social History Main Topics  . Smoking status: Former Smoker -- 15 years    Types: Cigarettes    Quit date: 07/23/1975  . Smokeless tobacco: Never Used  . Alcohol Use: 4.2 oz/week  7 Glasses of wine per week  . Drug Use: No  . Sexual Activity: None   Other Topics Concern  . None   Social History Narrative  . None      REVIEW OF SYSTEMS - PERTINENT POSITIVES ONLY: Review of Systems - General ROS: negative for - chills, fever or weight loss Hematological and Lymphatic ROS: negative for - bleeding problems, blood clots or bruising Respiratory ROS: no cough, shortness of breath, or wheezing Cardiovascular ROS: no chest pain or dyspnea on exertion Gastrointestinal ROS: no abdominal pain, change in bowel habits, or black or bloody stools Genito-Urinary ROS: no dysuria, trouble voiding, or hematuria  EXAM: Filed Vitals:   12/02/13 1319  BP: 125/80  Pulse: 65  Temp: 97.8 F (36.6 C)  Resp: 16    General appearance: alert and cooperative Resp: clear to auscultation bilaterally Cardio: regular rate and rhythm GI: soft, non-tender; bowel sounds normal; no masses,  no organomegaly  Anal Exam Findings: L posterior mass consistent with perirectal abscess or fistula.      ASSESSMENT AND PLAN: Stuart Watson is a 80 y.o. M with what appears to be a chronic abscess perianally. I  believe this has failed to get worse due to his prednisone therapy for PMR.  I have recommended an office I&D to begin.  We discussed that his wound may not heal due to his prednisone use and the possibility of a fistula.  He understands that he may need an additional procedure.  We will have him hold his Plavix prior to the procedure.      Rosario Adie, MD Colon and Rectal Surgery / [facility]      Visit Diagnoses: 1. Perianal abscess     Primary Care Physician: Horton Finer, MD

## 2013-12-25 ENCOUNTER — Encounter (INDEPENDENT_AMBULATORY_CARE_PROVIDER_SITE_OTHER): Payer: Self-pay | Admitting: General Surgery

## 2013-12-25 ENCOUNTER — Ambulatory Visit (INDEPENDENT_AMBULATORY_CARE_PROVIDER_SITE_OTHER): Payer: Medicare Other | Admitting: General Surgery

## 2013-12-25 VITALS — BP 130/74 | HR 70 | Temp 97.5°F | Resp 16 | Ht 67.0 in | Wt 157.0 lb

## 2013-12-25 DIAGNOSIS — K612 Anorectal abscess: Secondary | ICD-10-CM

## 2013-12-25 DIAGNOSIS — K611 Rectal abscess: Secondary | ICD-10-CM | POA: Insufficient documentation

## 2013-12-25 MED ORDER — LIDOCAINE 5 % EX OINT: 1.0000 "application " | TOPICAL_OINTMENT | CUTANEOUS | Status: DC | PRN

## 2013-12-25 NOTE — Progress Notes (Signed)
Chief Complaint   Patient presents with   .  Abscess    HISTORY: Stuart Watson is a 80 y.o. male who presents to the office with a mass that is enlarging. Other symptoms include pain. This had been occurring for about a year. His bowel habits are regular and his bowel movements are soft. His fiber intake is dietary. His last colonoscopy was about 2-3 yrs ago. He has had some polyps removed in the past. He denies any fecal leakage or loose stools. No personal or family h/o IBD. He has had no anorectal procedures in the past.  Past Medical History   Diagnosis  Date   .  History of BPH    .  Perennial allergic rhinitis    .  Restless leg syndrome    .  Lumbar radiculopathy    .  GERD (gastroesophageal reflux disease)      omeprazole 1-2 times daily   .  Actinic keratoses    .  H/O TIA (transient ischemic attack) and stroke      on statins and plavix -saw Dr.Sethi in the past   .  Retinal disease      right eye since birth   .  Need for shingles vaccine    .  Shingles    .  Colon polyps    .  Dyspnea  07/13/2013     BNP normal   .  History of TIA (transient ischemic attack)  07/13/2013   .  Edema  07/13/2013   .  Polymyalgia rheumatica  07/13/2013     Daily prednisone   .  Arthritis     Past Surgical History   Procedure  Laterality  Date   .  Vasectomy     .  Colonoscopy      Current Outpatient Prescriptions   Medication  Sig  Dispense  Refill   .  acetaminophen (TYLENOL) 325 MG tablet  Take 650 mg by mouth every 6 (six) hours as needed.     .  clopidogrel (PLAVIX) 75 MG tablet  Take 75 mg by mouth daily with breakfast.     .  doxazosin (CARDURA) 8 MG tablet  Take 8 mg by mouth daily.     .  fluticasone (VERAMYST) 27.5 MCG/SPRAY nasal spray  Place 2 sprays into the nose daily.     .  furosemide (LASIX) 20 MG tablet  Take 20 mg by mouth as needed.     .  Multiple Vitamin (MULTIVITAMIN) tablet  Take 1 tablet by mouth daily.     Marland Kitchen  omeprazole (PRILOSEC) 20 MG capsule  Take 20 mg  by mouth daily.     Marland Kitchen  PREDNISONE PO  Take 7 mg by mouth as directed.     .  simvastatin (ZOCOR) 20 MG tablet  Take 20 mg by mouth daily.     .  traMADol (ULTRAM) 50 MG tablet       No current facility-administered medications for this visit.    No Known Allergies  Family History   Problem  Relation  Age of Onset   .  Stroke  Mother    .  Coronary artery disease  Mother    .  Diabetes  Mother    .  Breast cancer  Mother    .  Heart attack  Father    .  Coronary artery disease  Father    .  Graves' disease  Sister     History  Social History   .  Marital Status:  Married     Spouse Name:  N/A     Number of Children:  N/A   .  Years of Education:  N/A    Social History Main Topics   .  Smoking status:  Former Smoker -- 15 years     Types:  Cigarettes     Quit date:  07/23/1975   .  Smokeless tobacco:  Never Used   .  Alcohol Use:  4.2 oz/week     7 Glasses of wine per week   .  Drug Use:  No   .  Sexual Activity:  None    Other Topics  Concern   .  None    Social History Narrative   .  None    REVIEW OF SYSTEMS - PERTINENT POSITIVES ONLY:  Review of Systems - General ROS: negative for - chills, fever or weight loss  Hematological and Lymphatic ROS: negative for - bleeding problems, blood clots or bruising  Respiratory ROS: no cough, shortness of breath, or wheezing  Cardiovascular ROS: no chest pain or dyspnea on exertion  Gastrointestinal ROS: no abdominal pain, change in bowel habits, or black or bloody stools  Genito-Urinary ROS: no dysuria, trouble voiding, or hematuria  EXAM:  Filed Vitals:   12/25/13 1126  BP: 130/74  Pulse: 70  Temp: 97.5 F (36.4 C)  Resp: 16    General appearance: alert and cooperative  Resp: clear to auscultation bilaterally  Cardio: regular rate and rhythm  GI: soft, non-tender; bowel sounds normal; no masses, no organomegaly  Anal Exam Findings: L posterior mass consistent with perirectal abscess or fistula.   Procedure:  I&D perirectal abscess Surgeon: Marcello Moores Assistant: Cherylann Parr After the risks and benefits were explained, verbal consent was obtained for above procedure  Anesthesia: lidocaine Diagnosis: perirectal abscess Procedure:  Area cleaned with chloroprep.  Injected with lidocaine.  Incision made with 11 blade scalpel.  Cloudy serous fluid expressed consistent with perirectal abscess in immunosuppressed individual.  Hemostasis achieved with pressure.  Dressing applied.  ASSESSMENT AND PLAN:  Stuart Watson is a 80 y.o. M with what appears to be a chronic abscess perianally. I believe this has failed to get worse due to his prednisone therapy for PMR. I have recommended an office I&D to begin. This was done today.  Resume plavix in AM.  Lidocaine ointment and instructions given for post op care and pain control.  RTO in 4 weeks.   Rosario Adie, MD  Colon and Rectal Surgery / [facility]

## 2013-12-25 NOTE — Patient Instructions (Signed)
Home Instructions Following Incision and Drainage of Perirectal Abscess  Resume your Plavix tomorrow.  Wound care - A dressing has been applied to control any bleeding or drainage immediately after your procedure.  You may remove this dressing at your first bowel movement or tomorrow morning, whichever comes first.  There may be packing inside your wound as well that should be removed with the dressing.  You do not need to repack the area.  After the dressing is removed, clean the area gently with a mild soap and warm water and place a piece of 100% cotton over the area.  Change to cotton ever 1-3 hours while awake to keep the area clean and dry.   - Beginning tomorrow, sit in a tub of warm water for 15-20 minutes at least twice a day and after bowel movements.  This will help with healing, pain and discomfort. - A small amount of bleeding is to be expected.  If you notice an increase in the bleeding, place a large piece of cotton (about the size of a golf ball) next to the anal opening and sit on a hard surface for 15 minutes.  If the bleeding persists or if you are concerned, please call the office.  Do not sit on rubber rings.  Instead, sit on a soft pillow.    Diet -Eat a regular diet.  Avoid foods that may constipate you or give you diarrhea.  Drink 6-8 glasses of water a day and avoid seeds, nuts and popcorn until the area heals.  Medication -Take pain medication as directed.  Do not drive or operate machinery if you are taking a prescription pain medication.   - We recommend Extra Strength Tylenol for mild to moderate pain.  This can be taken as instructed on the bottle.   - If you are given a prescription for antibiotics, take as instructed by your doctor until the entire course is completed  Bowel Habits Avoid laxatives unless instructed by your doctor. Take a fiber supplement twice a day (Metamucil, FiberCon, Benefiber) Avoid excessive straining to have a bowel movement Do not go for  more than 3 days without a bowel movement.  Take a regular Fleet enema if you are constipated.  Call the office if unable to do this or no results.    Activity Resume activities as tolerated beginning tomorrow.  Avoid strenuous activities or sports for one week.    Call the office if you have any questions.  Call IMMEDIATELY if you should develop persistent heavy rectal bleeding, increase in pain, difficulty urinating or fever greater than 100 F.

## 2013-12-30 DIAGNOSIS — H2510 Age-related nuclear cataract, unspecified eye: Secondary | ICD-10-CM | POA: Diagnosis not present

## 2013-12-30 DIAGNOSIS — H04129 Dry eye syndrome of unspecified lacrimal gland: Secondary | ICD-10-CM | POA: Diagnosis not present

## 2013-12-30 DIAGNOSIS — H31009 Unspecified chorioretinal scars, unspecified eye: Secondary | ICD-10-CM | POA: Diagnosis not present

## 2013-12-30 DIAGNOSIS — H01009 Unspecified blepharitis unspecified eye, unspecified eyelid: Secondary | ICD-10-CM | POA: Diagnosis not present

## 2014-01-04 ENCOUNTER — Ambulatory Visit
Admission: RE | Admit: 2014-01-04 | Discharge: 2014-01-04 | Disposition: A | Discharge status: Home / Self Care | Payer: Medicare Other | Source: Ambulatory Visit | Attending: Internal Medicine | Admitting: Internal Medicine

## 2014-01-04 DIAGNOSIS — IMO0002 Reserved for concepts with insufficient information to code with codable children: Secondary | ICD-10-CM | POA: Diagnosis not present

## 2014-01-04 DIAGNOSIS — J984 Other disorders of lung: Secondary | ICD-10-CM | POA: Diagnosis not present

## 2014-01-04 DIAGNOSIS — M353 Polymyalgia rheumatica: Secondary | ICD-10-CM | POA: Diagnosis not present

## 2014-01-04 DIAGNOSIS — R5383 Other fatigue: Secondary | ICD-10-CM | POA: Diagnosis not present

## 2014-01-04 DIAGNOSIS — R5381 Other malaise: Secondary | ICD-10-CM | POA: Diagnosis not present

## 2014-01-04 DIAGNOSIS — R911 Solitary pulmonary nodule: Secondary | ICD-10-CM

## 2014-01-04 DIAGNOSIS — M2550 Pain in unspecified joint: Secondary | ICD-10-CM | POA: Diagnosis not present

## 2014-01-25 ENCOUNTER — Ambulatory Visit (INDEPENDENT_AMBULATORY_CARE_PROVIDER_SITE_OTHER): Payer: Medicare Other | Admitting: General Surgery

## 2014-01-25 ENCOUNTER — Encounter (INDEPENDENT_AMBULATORY_CARE_PROVIDER_SITE_OTHER): Payer: Self-pay | Admitting: General Surgery

## 2014-01-25 VITALS — BP 122/75 | HR 61 | Temp 97.8°F | Resp 16 | Ht 68.0 in | Wt 159.2 lb

## 2014-01-25 DIAGNOSIS — K612 Anorectal abscess: Secondary | ICD-10-CM

## 2014-01-25 DIAGNOSIS — K611 Rectal abscess: Secondary | ICD-10-CM

## 2014-01-25 NOTE — Patient Instructions (Signed)
Call the office with any concerns or problems

## 2014-01-25 NOTE — Progress Notes (Signed)
Stuart Watson is a 80 y.o. male who is here for a follow up visit regarding his follow up for a chronic perirectal abscess.  This was drained in the office.  He is feeling better.  He denies any pain or drainage.  He is on chronic steroids.   Objective: Filed Vitals:   01/25/14 1403  BP: 122/75  Pulse: 61  Temp: 97.8 F (36.6 C)  Resp: 16    General appearance: alert and cooperative incision healing well, no fluctuance, no drainage or signs of fistula   Assessment and Plan: Doing well after abscess drainage.  RTO as needed    Rosario Adie, MD [facility] [place]

## 2014-04-02 ENCOUNTER — Encounter: Payer: Self-pay | Admitting: Pulmonary Disease

## 2014-04-02 ENCOUNTER — Ambulatory Visit (INDEPENDENT_AMBULATORY_CARE_PROVIDER_SITE_OTHER): Payer: Medicare Other | Admitting: Pulmonary Disease

## 2014-04-02 VITALS — BP 116/60 | HR 64 | Temp 98.6°F | Ht 68.0 in | Wt 157.6 lb

## 2014-04-02 DIAGNOSIS — R059 Cough, unspecified: Secondary | ICD-10-CM | POA: Diagnosis not present

## 2014-04-02 DIAGNOSIS — J42 Unspecified chronic bronchitis: Secondary | ICD-10-CM | POA: Diagnosis not present

## 2014-04-02 DIAGNOSIS — R05 Cough: Secondary | ICD-10-CM | POA: Diagnosis not present

## 2014-04-02 DIAGNOSIS — R053 Chronic cough: Secondary | ICD-10-CM | POA: Insufficient documentation

## 2014-04-02 NOTE — Patient Instructions (Signed)
Take chlorpheniramine 4mg , 2 at bedtime everynight  to see if this drys up your postnasal drip.  Take in the place of benedryl Will get your records from [facility] to review. Will schedule for breathing tests, and will call you with results to discuss further.

## 2014-04-02 NOTE — Assessment & Plan Note (Signed)
The patient has a chronic cough of 2 years duration that overwhelmingly sounds upper airway in origin based on his history, exam, and x-ray findings. He tells me that he is been diagnosed with chronic sinusitis, with persistent sinus symptoms despite long courses of antibiotics. We will get records from his otolaryngologist to look at this further. There is really nothing to suggest airways disease at this time, but he will need to have pulmonary function studies to put the issue to rest. He is continuing to have constant postnasal drip that worsens when he lies down and results and ongoing cough. He also has a history of reflux disease, but is noting rare breakthrough. I will try him on a stronger antihistamine at bedtime, and will also schedule for pulmonary function studies. If these are totally unremarkable, I suspect he needs to get back with ENT for further evaluation.

## 2014-04-02 NOTE — Progress Notes (Signed)
   Subjective:    Patient ID: Stuart Watson, male    DOB: [birth year], 80 y.o.   MRN: [id number]  HPI The patient is an 80 year old male who I've been asked to see for chronic cough.  He developed this approximately 2 years ago, and it was associated with severe postnasal drip. He has seen otolaryngology, and apparently diagnosed with chronic sinusitis last year and treated with multiple rounds of antibiotics. They also suggested possible surgery, but the patient went for a second opinion who also treated him with multiple right for what sounds like chronic sinusitis. After 2 months of treatment, he apparently had some persistent disease, but it was unclear whether surgery would help him. He feels that his nasal symptoms and postnasal drip are better, but still persistent. He continues to have significant postnasal drip, along with upper airway pseudo wheezing as well as hoarseness which is constant. He denies any chest congestion or any significant dyspnea on exertion. He did not have asthma as a child, but did have allergies. He tells me that his cough starts in the evening, and is always worse upon lying down. The mucous may be clear to discolored, but it is primarily clear. He has been tried on Advair without a change. He is also had a CT of his chest which showed unchanged pulmonary nodules, trace bilateral effusions, and minimal emphysematous changes. There was no evidence for bronchiectasis, but this was not a high-resolution scan. The patient does have a history of smoking one pack per day for 15 years, but has not smoked since 1976.   Review of Systems  Constitutional: Negative for fever and unexpected weight change.  HENT: Positive for congestion, postnasal drip, rhinorrhea, sinus pressure, sneezing and sore throat. Negative for dental problem, ear pain, nosebleeds and trouble swallowing.   Eyes: Negative for redness and itching.  Respiratory: Positive for cough, shortness of breath and  wheezing. Negative for choking and chest tightness.   Cardiovascular: Positive for leg swelling. Negative for palpitations.  Gastrointestinal: Negative for nausea and vomiting.  Genitourinary: Negative for dysuria.  Musculoskeletal: Negative for joint swelling.  Skin: Positive for rash.  Neurological: Positive for headaches.  Hematological: Does not bruise/bleed easily.  Psychiatric/Behavioral: Negative for dysphoric mood. The patient is not nervous/anxious.        Objective:   Physical Exam Constitutional:  Well developed, no acute distress  HENT:  Nares patent without discharge  Oropharynx without exudate, palate and uvula are normal  Eyes:  Perrla, eomi, no scleral icterus  Neck:  No JVD, no TMG  Cardiovascular:  Normal rate, regular rhythm, no rubs or gallops.  No murmurs        Intact distal pulses  Pulmonary :  Normal breath sounds, no stridor or respiratory distress   No rales, rhonchi, or wheezing  Abdominal:  Soft, nondistended, bowel sounds present.  No tenderness noted.   Musculoskeletal:  1+ lower extremity edema noted.  Lymph Nodes:  No cervical lymphadenopathy noted  Skin:  No cyanosis noted  Neurologic:  Alert, appropriate, moves all 4 extremities without obvious deficit.         Assessment & Plan:

## 2014-04-06 DIAGNOSIS — M2550 Pain in unspecified joint: Secondary | ICD-10-CM | POA: Diagnosis not present

## 2014-04-06 DIAGNOSIS — M353 Polymyalgia rheumatica: Secondary | ICD-10-CM | POA: Diagnosis not present

## 2014-04-06 DIAGNOSIS — R5381 Other malaise: Secondary | ICD-10-CM | POA: Diagnosis not present

## 2014-04-06 DIAGNOSIS — R5383 Other fatigue: Secondary | ICD-10-CM | POA: Diagnosis not present

## 2014-04-06 DIAGNOSIS — IMO0002 Reserved for concepts with insufficient information to code with codable children: Secondary | ICD-10-CM | POA: Diagnosis not present

## 2014-04-21 ENCOUNTER — Ambulatory Visit (HOSPITAL_COMMUNITY)
Admission: RE | Admit: 2014-04-21 | Discharge: 2014-04-21 | Disposition: A | Discharge status: Home / Self Care | Payer: Medicare Other | Source: Ambulatory Visit | Attending: Pulmonary Disease | Admitting: Pulmonary Disease

## 2014-04-21 DIAGNOSIS — R059 Cough, unspecified: Secondary | ICD-10-CM | POA: Insufficient documentation

## 2014-04-21 DIAGNOSIS — Z87891 Personal history of nicotine dependence: Secondary | ICD-10-CM | POA: Diagnosis not present

## 2014-04-21 DIAGNOSIS — R0609 Other forms of dyspnea: Secondary | ICD-10-CM | POA: Diagnosis not present

## 2014-04-21 DIAGNOSIS — R05 Cough: Secondary | ICD-10-CM

## 2014-04-21 DIAGNOSIS — R0989 Other specified symptoms and signs involving the circulatory and respiratory systems: Secondary | ICD-10-CM | POA: Diagnosis not present

## 2014-04-21 DIAGNOSIS — R053 Chronic cough: Secondary | ICD-10-CM

## 2014-04-21 LAB — PULMONARY FUNCTION TEST
DL/VA % PRED: 84 %
DL/VA: 3.78 ml/min/mmHg/L
DLCO UNC % PRED: 74 %
DLCO UNC: 22.18 ml/min/mmHg
DLCO cor % pred: 74 %
DLCO cor: 22.18 ml/min/mmHg
FEF 25-75 POST: 2.08 L/s
FEF 25-75 Pre: 1.83 L/sec
FEF2575-%CHANGE-POST: 13 %
FEF2575-%PRED-POST: 116 %
FEF2575-%Pred-Pre: 102 %
FEV1-%CHANGE-POST: 3 %
FEV1-%Pred-Post: 98 %
FEV1-%Pred-Pre: 95 %
FEV1-POST: 2.58 L
FEV1-PRE: 2.49 L
FEV1FVC-%CHANGE-POST: 0 %
FEV1FVC-%PRED-PRE: 101 %
FEV6-%CHANGE-POST: 4 %
FEV6-%PRED-POST: 101 %
FEV6-%PRED-PRE: 97 %
FEV6-POST: 3.5 L
FEV6-Pre: 3.35 L
FEV6FVC-%CHANGE-POST: 0 %
FEV6FVC-%PRED-PRE: 106 %
FEV6FVC-%Pred-Post: 106 %
FVC-%Change-Post: 3 %
FVC-%Pred-Post: 95 %
FVC-%Pred-Pre: 92 %
FVC-PRE: 3.4 L
FVC-Post: 3.52 L
PRE FEV6/FVC RATIO: 99 %
Post FEV1/FVC ratio: 73 %
Post FEV6/FVC ratio: 99 %
Pre FEV1/FVC ratio: 73 %
RV % pred: 111 %
RV: 2.84 L
TLC % pred: 96 %
TLC: 6.45 L

## 2014-04-21 MED ORDER — ALBUTEROL SULFATE (2.5 MG/3ML) 0.083% IN NEBU
2.5000 mg | INHALATION_SOLUTION | Freq: Once | RESPIRATORY_TRACT | Status: AC
  Administered 2014-04-21: 2.5 mg via RESPIRATORY_TRACT

## 2014-04-26 ENCOUNTER — Encounter: Payer: Self-pay | Admitting: Pulmonary Disease

## 2014-04-27 ENCOUNTER — Telehealth: Payer: Self-pay | Admitting: Pulmonary Disease

## 2014-04-27 NOTE — Telephone Encounter (Signed)
I did not put him on an inhaler because his breathing studies showed no evidence for copd or asthma.  They were normal.  If he wants to try it , [facility] with me.  But would not do 2 things at one time.  Should increase the antihistamine first or try the inhaler before going up on the antihistamine. It just makes since to me that since he has a lot of nasal symptoms and postnasal drip, would try antihistamine first.

## 2014-04-27 NOTE — Telephone Encounter (Signed)
The chlorpheniramine is a very good antihistamine.  He can take one at lunch in addition to his 2 at bedtime if he is continuing to have postnasal drip.  I would get back with ENT to see if they have anything else to offer for his persistent nasal symptoms??

## 2014-04-27 NOTE — Telephone Encounter (Signed)
Patient calling back requesting results of PFT.

## 2014-04-27 NOTE — Telephone Encounter (Signed)
Result Note     Please let pt know that his breathing studies do not show any evidence for copd or asthma. See if his cough is better with antihistamines?? If not, would like for him to get back with his ENT doctor to see if they can do something else from a sinus standpoint. Let me know if he did better with antihistamines.   Called spoke with pt. He reports the only thing he is taking is the chlortrimeton 2 tabs at bedtime. It has helped some with the PND. Pt reports cough may be slightly better. Pt reports if he is suppose to be taken a different medication to help then he will before he see's ENT. Please advise Bledsoe thanks

## 2014-04-27 NOTE — Telephone Encounter (Signed)
Spoke with patient-- aware of rec's per [facility] Pt states that he may not contact ENT at this time-- will if symptoms persist. Pt states that he is going to try the extra Chlorpheniramine dose at 12p for about a week and go from there.   Pt giving FYI that he was given [facility] by Dr Deforest Hoyles-- could not remember the name of this inhaler when seen by [facility] 04/02/14 Pt would like to know if he needs to be taking this or if he would even benefit from it.

## 2014-04-28 NOTE — Telephone Encounter (Signed)
Pt aware of recs per [facility]. Nothing further needed. 

## 2014-05-12 DIAGNOSIS — Z23 Encounter for immunization: Secondary | ICD-10-CM | POA: Diagnosis not present

## 2014-06-14 ENCOUNTER — Ambulatory Visit (INDEPENDENT_AMBULATORY_CARE_PROVIDER_SITE_OTHER): Payer: Medicare Other

## 2014-06-14 ENCOUNTER — Ambulatory Visit (INDEPENDENT_AMBULATORY_CARE_PROVIDER_SITE_OTHER): Payer: Medicare Other | Admitting: Podiatry

## 2014-06-14 ENCOUNTER — Encounter: Payer: Self-pay | Admitting: Podiatry

## 2014-06-14 VITALS — BP 130/77 | HR 78 | Resp 11 | Ht 68.0 in | Wt 155.0 lb

## 2014-06-14 DIAGNOSIS — B351 Tinea unguium: Secondary | ICD-10-CM | POA: Diagnosis not present

## 2014-06-14 DIAGNOSIS — M722 Plantar fascial fibromatosis: Secondary | ICD-10-CM

## 2014-06-14 DIAGNOSIS — I2510 Atherosclerotic heart disease of native coronary artery without angina pectoris: Secondary | ICD-10-CM | POA: Diagnosis not present

## 2014-06-14 DIAGNOSIS — M79604 Pain in right leg: Secondary | ICD-10-CM

## 2014-06-14 DIAGNOSIS — I2584 Coronary atherosclerosis due to calcified coronary lesion: Secondary | ICD-10-CM

## 2014-06-14 NOTE — Progress Notes (Signed)
   Subjective:    Patient ID: Stuart Watson, male    DOB: October 01, 1931, 80 y.o.   MRN: [id number]  HPI Comments: N fungal nails L B/L hallux, and thumb nails D and O over year C thickened, discolored toenails A loosing and lifting, long term steroid treatment T none  Pt states he has had right heel pain for over 1.5 month, on and off.   He describes mild right inferior heel pain after standing and walking. The patient walks 10-12 miles a week at the gym on a regular basis.   Review of Systems  HENT: Positive for congestion and sinus pressure.   All other systems reviewed and are negative.      Objective:   Physical Exam  Orientated 3 space  Vascular: DP right 2/4 DP left 1/4 PTs 1/4 bilaterally Pitting edema noted about ankles bilaterally  Neurological: Sensation to 10 g monofilament wire intact 5/5 right and 4/5 left Vibratory sensation intact bilaterally Ankle reflex equal and reactive bilaterally  Dermatological: The right and left hallux nails are elongated, dystrophic, discolored  Musculoskeletal: Pes planus bilaterally Palpable tenderness in the medial plantar aspect of the right heel which duplicates area of right heel pain without any palpable lesions  X-ray examination right foot  Intact bony structure without a fracture and/or dislocation noted Small inferior calcaneal spur  Radiographic impression: No acute bony abnormality noted in the right foot Small inferior calcaneal spur     Assessment & Plan:   Assessment: Edema bilaterally Diminished pedal pulses bilaterally Protective sensation intact bilaterally Mycotic hallux toenails Plantar fasciitis right  Plan: Recommend periodic debridement of the hallux toenails which and did today. Discussed stretching and shoeing for treatment of plantar fasciitis  Reappoint at patient's request

## 2014-06-14 NOTE — Patient Instructions (Signed)
Bent - Knee Calf Stretch  1) Stand an arm's length away from a wall. Place the palms of your hands on the wall. Step forward about 12 inches with the opposite foot.  2) Keeping toes pointed forward and both heels on the floor, bend both knees and lean forward. Hold this position for 60 seconds. Don't arch your back and don't hunch your shoulders.  3) Repeat this twice.  DO THIS STRETCHING TECHNIQUE 3 TIMES A DAY.   Stretching Exercises before Standing      Pull your toes up toward your nose and hold for 1 minute before standing.  A towel can assist with this exercise if you put the towel under the ball of your foot. This exercise reduces the intense    pain associated when changing from a seated to a standing position. This stretch can usually be the most beneficial if done before getting out of bed in the mornings. Plantar Fasciitis Plantar fasciitis is a common condition that causes foot pain. It is soreness (inflammation) of the band of tough fibrous tissue on the bottom of the foot that runs from the heel bone (calcaneus) to the ball of the foot. The cause of this soreness may be from excessive standing, poor fitting shoes, running on hard surfaces, being overweight, having an abnormal walk, or overuse (this is common in runners) of the painful foot or feet. It is also common in aerobic exercise dancers and ballet dancers. SYMPTOMS  Most people with plantar fasciitis complain of:  Severe pain in the morning on the bottom of their foot especially when taking the first steps out of bed. This pain recedes after a few minutes of walking.  Severe pain is experienced also during walking following a long period of inactivity.  Pain is worse when walking barefoot or up stairs DIAGNOSIS   Your caregiver will diagnose this condition by examining and feeling your foot.  Special tests such as X-rays of your foot, are usually not needed. PREVENTION   Consult a sports medicine professional  before beginning a new exercise program.  Walking programs offer a good workout. With walking there is a lower chance of overuse injuries common to runners. There is less impact and less jarring of the joints.  Begin all new exercise programs slowly. If problems or pain develop, decrease the amount of time or distance until you are at a comfortable level.  Wear good shoes and replace them regularly.  Stretch your foot and the heel cords at the back of the ankle (Achilles tendon) both before and after exercise.  Run or exercise on even surfaces that are not hard. For example, asphalt is better than pavement.  Do not run barefoot on hard surfaces.  If using a treadmill, vary the incline.  Do not continue to workout if you have foot or joint problems. Seek professional help if they do not improve. HOME CARE INSTRUCTIONS   Avoid activities that cause you pain until you recover.  Use ice or cold packs on the problem or painful areas after working out.  Only take over-the-counter or prescription medicines for pain, discomfort, or fever as directed by your caregiver.  Soft shoe inserts or athletic shoes with air or gel sole cushions may be helpful.  If problems continue or become more severe, consult a sports medicine caregiver or your own health care provider. Cortisone is a potent anti-inflammatory medication that may be injected into the painful area. You can discuss this treatment with your caregiver. MAKE   SURE YOU:   Understand these instructions.  Will watch your condition.  Will get help right away if you are not doing well or get worse. Document Released: 04/03/2001 Document Revised: 10/01/2011 Document Reviewed: 06/02/2008 ExitCare Patient Information 2015 [employer]. This information is not intended to replace advice given to you by your health care provider. Make sure you discuss any questions you have with your health care provider.  

## 2014-06-29 DIAGNOSIS — M353 Polymyalgia rheumatica: Secondary | ICD-10-CM | POA: Diagnosis not present

## 2014-06-29 DIAGNOSIS — R5383 Other fatigue: Secondary | ICD-10-CM | POA: Diagnosis not present

## 2014-06-29 DIAGNOSIS — M2550 Pain in unspecified joint: Secondary | ICD-10-CM | POA: Diagnosis not present

## 2014-07-01 DIAGNOSIS — Z0000 Encounter for general adult medical examination without abnormal findings: Secondary | ICD-10-CM | POA: Diagnosis not present

## 2014-07-01 DIAGNOSIS — G2581 Restless legs syndrome: Secondary | ICD-10-CM | POA: Diagnosis not present

## 2014-07-01 DIAGNOSIS — N400 Enlarged prostate without lower urinary tract symptoms: Secondary | ICD-10-CM | POA: Diagnosis not present

## 2014-07-01 DIAGNOSIS — Z1389 Encounter for screening for other disorder: Secondary | ICD-10-CM | POA: Diagnosis not present

## 2014-07-01 DIAGNOSIS — R7309 Other abnormal glucose: Secondary | ICD-10-CM | POA: Diagnosis not present

## 2014-07-01 DIAGNOSIS — M353 Polymyalgia rheumatica: Secondary | ICD-10-CM | POA: Diagnosis not present

## 2014-07-01 DIAGNOSIS — K219 Gastro-esophageal reflux disease without esophagitis: Secondary | ICD-10-CM | POA: Diagnosis not present

## 2014-07-01 DIAGNOSIS — J309 Allergic rhinitis, unspecified: Secondary | ICD-10-CM | POA: Diagnosis not present

## 2014-07-01 DIAGNOSIS — E780 Pure hypercholesterolemia: Secondary | ICD-10-CM | POA: Diagnosis not present

## 2014-07-01 DIAGNOSIS — G459 Transient cerebral ischemic attack, unspecified: Secondary | ICD-10-CM | POA: Diagnosis not present

## 2014-07-26 DIAGNOSIS — B351 Tinea unguium: Secondary | ICD-10-CM | POA: Diagnosis not present

## 2014-07-26 DIAGNOSIS — I8311 Varicose veins of right lower extremity with inflammation: Secondary | ICD-10-CM | POA: Diagnosis not present

## 2014-07-26 DIAGNOSIS — I8312 Varicose veins of left lower extremity with inflammation: Secondary | ICD-10-CM | POA: Diagnosis not present

## 2014-08-11 DIAGNOSIS — B351 Tinea unguium: Secondary | ICD-10-CM | POA: Diagnosis not present

## 2014-08-11 DIAGNOSIS — Z79899 Other long term (current) drug therapy: Secondary | ICD-10-CM | POA: Diagnosis not present

## 2014-08-16 DIAGNOSIS — K603 Anal fistula: Secondary | ICD-10-CM | POA: Diagnosis not present

## 2014-08-31 DIAGNOSIS — K219 Gastro-esophageal reflux disease without esophagitis: Secondary | ICD-10-CM | POA: Diagnosis not present

## 2014-08-31 DIAGNOSIS — B351 Tinea unguium: Secondary | ICD-10-CM | POA: Diagnosis not present

## 2014-08-31 DIAGNOSIS — J329 Chronic sinusitis, unspecified: Secondary | ICD-10-CM | POA: Diagnosis not present

## 2014-09-13 DIAGNOSIS — B351 Tinea unguium: Secondary | ICD-10-CM | POA: Diagnosis not present

## 2014-09-13 DIAGNOSIS — Z79899 Other long term (current) drug therapy: Secondary | ICD-10-CM | POA: Diagnosis not present

## 2014-09-27 DIAGNOSIS — R972 Elevated prostate specific antigen [PSA]: Secondary | ICD-10-CM | POA: Diagnosis not present

## 2014-09-30 DIAGNOSIS — J309 Allergic rhinitis, unspecified: Secondary | ICD-10-CM | POA: Diagnosis not present

## 2014-09-30 DIAGNOSIS — K219 Gastro-esophageal reflux disease without esophagitis: Secondary | ICD-10-CM | POA: Diagnosis not present

## 2014-09-30 DIAGNOSIS — R499 Unspecified voice and resonance disorder: Secondary | ICD-10-CM | POA: Diagnosis not present

## 2014-09-30 DIAGNOSIS — J329 Chronic sinusitis, unspecified: Secondary | ICD-10-CM | POA: Diagnosis not present

## 2014-11-09 DIAGNOSIS — B351 Tinea unguium: Secondary | ICD-10-CM | POA: Diagnosis not present

## 2014-12-03 DIAGNOSIS — J329 Chronic sinusitis, unspecified: Secondary | ICD-10-CM | POA: Diagnosis not present

## 2014-12-03 DIAGNOSIS — J019 Acute sinusitis, unspecified: Secondary | ICD-10-CM | POA: Diagnosis not present

## 2014-12-27 DIAGNOSIS — R600 Localized edema: Secondary | ICD-10-CM | POA: Diagnosis not present

## 2014-12-27 DIAGNOSIS — N400 Enlarged prostate without lower urinary tract symptoms: Secondary | ICD-10-CM | POA: Diagnosis not present

## 2014-12-27 DIAGNOSIS — K219 Gastro-esophageal reflux disease without esophagitis: Secondary | ICD-10-CM | POA: Diagnosis not present

## 2014-12-27 DIAGNOSIS — E780 Pure hypercholesterolemia: Secondary | ICD-10-CM | POA: Diagnosis not present

## 2014-12-27 DIAGNOSIS — G2581 Restless legs syndrome: Secondary | ICD-10-CM | POA: Diagnosis not present

## 2014-12-27 DIAGNOSIS — J329 Chronic sinusitis, unspecified: Secondary | ICD-10-CM | POA: Diagnosis not present

## 2015-01-05 DIAGNOSIS — H04123 Dry eye syndrome of bilateral lacrimal glands: Secondary | ICD-10-CM | POA: Diagnosis not present

## 2015-01-05 DIAGNOSIS — H43812 Vitreous degeneration, left eye: Secondary | ICD-10-CM | POA: Diagnosis not present

## 2015-01-05 DIAGNOSIS — H31001 Unspecified chorioretinal scars, right eye: Secondary | ICD-10-CM | POA: Diagnosis not present

## 2015-01-05 DIAGNOSIS — H2513 Age-related nuclear cataract, bilateral: Secondary | ICD-10-CM | POA: Diagnosis not present

## 2015-04-15 DIAGNOSIS — B351 Tinea unguium: Secondary | ICD-10-CM | POA: Diagnosis not present

## 2015-04-15 DIAGNOSIS — L821 Other seborrheic keratosis: Secondary | ICD-10-CM | POA: Diagnosis not present

## 2015-04-15 DIAGNOSIS — D1801 Hemangioma of skin and subcutaneous tissue: Secondary | ICD-10-CM | POA: Diagnosis not present

## 2015-05-24 DIAGNOSIS — Z23 Encounter for immunization: Secondary | ICD-10-CM | POA: Diagnosis not present

## 2015-06-22 DIAGNOSIS — Z8673 Personal history of transient ischemic attack (TIA), and cerebral infarction without residual deficits: Secondary | ICD-10-CM | POA: Diagnosis not present

## 2015-06-22 DIAGNOSIS — R05 Cough: Secondary | ICD-10-CM | POA: Diagnosis not present

## 2015-06-22 DIAGNOSIS — Z7902 Long term (current) use of antithrombotics/antiplatelets: Secondary | ICD-10-CM | POA: Diagnosis not present

## 2015-06-22 DIAGNOSIS — R490 Dysphonia: Secondary | ICD-10-CM | POA: Diagnosis not present

## 2015-06-22 DIAGNOSIS — J329 Chronic sinusitis, unspecified: Secondary | ICD-10-CM | POA: Diagnosis not present

## 2015-06-28 ENCOUNTER — Emergency Department (HOSPITAL_BASED_OUTPATIENT_CLINIC_OR_DEPARTMENT_OTHER)
Admission: EM | Admit: 2015-06-28 | Discharge: 2015-06-28 | Disposition: A | Discharge status: Home / Self Care | Payer: Medicare Other | Attending: Emergency Medicine | Admitting: Emergency Medicine

## 2015-06-28 ENCOUNTER — Encounter (HOSPITAL_BASED_OUTPATIENT_CLINIC_OR_DEPARTMENT_OTHER): Payer: Self-pay | Admitting: Emergency Medicine

## 2015-06-28 ENCOUNTER — Emergency Department (HOSPITAL_BASED_OUTPATIENT_CLINIC_OR_DEPARTMENT_OTHER): Payer: Medicare Other

## 2015-06-28 DIAGNOSIS — Z7902 Long term (current) use of antithrombotics/antiplatelets: Secondary | ICD-10-CM | POA: Diagnosis not present

## 2015-06-28 DIAGNOSIS — R509 Fever, unspecified: Secondary | ICD-10-CM | POA: Diagnosis not present

## 2015-06-28 DIAGNOSIS — Z87891 Personal history of nicotine dependence: Secondary | ICD-10-CM | POA: Diagnosis not present

## 2015-06-28 DIAGNOSIS — G2581 Restless legs syndrome: Secondary | ICD-10-CM | POA: Insufficient documentation

## 2015-06-28 DIAGNOSIS — Z8619 Personal history of other infectious and parasitic diseases: Secondary | ICD-10-CM | POA: Diagnosis not present

## 2015-06-28 DIAGNOSIS — Z79899 Other long term (current) drug therapy: Secondary | ICD-10-CM | POA: Insufficient documentation

## 2015-06-28 DIAGNOSIS — M1990 Unspecified osteoarthritis, unspecified site: Secondary | ICD-10-CM | POA: Insufficient documentation

## 2015-06-28 DIAGNOSIS — K219 Gastro-esophageal reflux disease without esophagitis: Secondary | ICD-10-CM | POA: Insufficient documentation

## 2015-06-28 DIAGNOSIS — Z872 Personal history of diseases of the skin and subcutaneous tissue: Secondary | ICD-10-CM | POA: Insufficient documentation

## 2015-06-28 DIAGNOSIS — Z7951 Long term (current) use of inhaled steroids: Secondary | ICD-10-CM | POA: Diagnosis not present

## 2015-06-28 DIAGNOSIS — Z8673 Personal history of transient ischemic attack (TIA), and cerebral infarction without residual deficits: Secondary | ICD-10-CM | POA: Diagnosis not present

## 2015-06-28 DIAGNOSIS — J209 Acute bronchitis, unspecified: Secondary | ICD-10-CM | POA: Diagnosis not present

## 2015-06-28 DIAGNOSIS — R05 Cough: Secondary | ICD-10-CM | POA: Diagnosis not present

## 2015-06-28 DIAGNOSIS — Z86010 Personal history of colonic polyps: Secondary | ICD-10-CM | POA: Insufficient documentation

## 2015-06-28 DIAGNOSIS — Z87438 Personal history of other diseases of male genital organs: Secondary | ICD-10-CM | POA: Insufficient documentation

## 2015-06-28 LAB — BASIC METABOLIC PANEL
Anion gap: 6 (ref 5–15)
BUN: 14 mg/dL (ref 6–20)
CO2: 22 mmol/L (ref 22–32)
CREATININE: 0.81 mg/dL (ref 0.61–1.24)
Calcium: 8 mg/dL — ABNORMAL LOW (ref 8.9–10.3)
Chloride: 107 mmol/L (ref 101–111)
Glucose, Bld: 118 mg/dL — ABNORMAL HIGH (ref 65–99)
POTASSIUM: 3.4 mmol/L — AB (ref 3.5–5.1)
SODIUM: 135 mmol/L (ref 135–145)

## 2015-06-28 LAB — CBC WITH DIFFERENTIAL/PLATELET
BASOS ABS: 0 10*3/uL (ref 0.0–0.1)
Basophils Relative: 1 %
EOS ABS: 0.2 10*3/uL (ref 0.0–0.7)
EOS PCT: 3 %
HCT: 34.5 % — ABNORMAL LOW (ref 39.0–52.0)
Hemoglobin: 11.8 g/dL — ABNORMAL LOW (ref 13.0–17.0)
Lymphocytes Relative: 11 %
Lymphs Abs: 0.6 10*3/uL — ABNORMAL LOW (ref 0.7–4.0)
MCH: 32.1 pg (ref 26.0–34.0)
MCHC: 34.2 g/dL (ref 30.0–36.0)
MCV: 93.8 fL (ref 78.0–100.0)
Monocytes Absolute: 0.7 10*3/uL (ref 0.1–1.0)
Monocytes Relative: 11 %
NEUTROS PCT: 74 %
Neutro Abs: 4.6 10*3/uL (ref 1.7–7.7)
PLATELETS: 263 10*3/uL (ref 150–400)
RBC: 3.68 MIL/uL — AB (ref 4.22–5.81)
RDW: 13.1 % (ref 11.5–15.5)
WBC: 6.1 10*3/uL (ref 4.0–10.5)

## 2015-06-28 MED ORDER — DOXYCYCLINE HYCLATE 100 MG PO CAPS: 100.0000 mg | ORAL_CAPSULE | Freq: Two times a day (BID) | ORAL | Status: DC

## 2015-06-28 NOTE — ED Provider Notes (Signed)
CSN: [id number]     Arrival date & time 06/28/15  1011 History   First MD Initiated Contact with Patient 06/28/15 1102     Chief Complaint  Patient presents with  . Fever     (Consider location/radiation/quality/duration/timing/severity/associated sxs/prior Treatment) HPI Comments: Patient is an 81 year old male with history of TIA, BPH. He presents for evaluation of fever and cough that started yesterday. He reports a cough that is productive of a yellow phlegm. He has a history of sinusitis in the past. He denies any ill contacts. He denies any aggravating or alleviating factors.  Patient is a 81 y.o. male presenting with fever. The history is provided by the patient.  Fever Max temp prior to arrival:  101 Temp source:  Oral Onset quality:  Sudden Duration:  2 days Timing:  Intermittent Progression:  Worsening Chronicity:  New Relieved by:  Nothing Worsened by:  Nothing tried Ineffective treatments:  None tried Associated symptoms: cough   Associated symptoms: no chills     Past Medical History  Diagnosis Date  . History of BPH   . Perennial allergic rhinitis   . Restless leg syndrome   . Lumbar radiculopathy   . GERD (gastroesophageal reflux disease)     omeprazole 1-2 times daily  . Actinic keratoses   . H/O TIA (transient ischemic attack) and stroke     on statins and plavix -saw Dr.Sethi in the past  . Retinal disease     right eye since birth   . Need for shingles vaccine   . Shingles   . Colon polyps   . Dyspnea 07/13/2013    BNP normal  . History of TIA (transient ischemic attack) 07/13/2013  . Edema 07/13/2013  . Polymyalgia rheumatica ([place]) 07/13/2013    Daily prednisone  . Arthritis    Past Surgical History  Procedure Laterality Date  . Vasectomy    . Colonoscopy     Family History  Problem Relation Age of Onset  . Stroke Mother   . Coronary artery disease Mother   . Diabetes Mother   . Breast cancer Mother   . Heart attack Father   .  Coronary artery disease Father   . Graves' disease Sister   . Diabetes Father    Social History  Substance Use Topics  . Smoking status: Former Smoker -- 1.00 packs/day for 15 years    Types: Cigarettes    Quit date: 07/23/1975  . Smokeless tobacco: Never Used  . Alcohol Use: 4.2 oz/week    7 Glasses of wine per week     Comment: 1 glass red wine daily    Review of Systems  Constitutional: Positive for fever. Negative for chills.  Respiratory: Positive for cough.   All other systems reviewed and are negative.     Allergies  Review of patient's allergies indicates no known allergies.  Home Medications   Prior to Admission medications   Medication Sig Start Date End Date Taking? Authorizing Provider  acetaminophen (TYLENOL) 325 MG tablet Take 650 mg by mouth every 6 (six) hours as needed.   Yes Historical Provider, MD  cetirizine (ZYRTEC) 10 MG tablet Take 10 mg by mouth daily.   Yes Historical Provider, MD  clopidogrel (PLAVIX) 75 MG tablet Take 75 mg by mouth daily with breakfast.   Yes Historical Provider, MD  doxazosin (CARDURA) 8 MG tablet Take 8 mg by mouth daily.   Yes Historical Provider, MD  Fish Oil-Cholecalciferol (FISH OIL + D3 PO) Take by  mouth.   Yes Historical Provider, MD  fluconazole (DIFLUCAN) 100 MG tablet Take 100 mg by mouth daily.   Yes Historical Provider, MD  fluticasone (VERAMYST) 27.5 MCG/SPRAY nasal spray Place 2 sprays into the nose daily.   Yes Historical Provider, MD  Multiple Vitamin (MULTIVITAMIN) tablet Take 1 tablet by mouth daily.   Yes Historical Provider, MD  omeprazole (PRILOSEC) 20 MG capsule Take 20 mg by mouth daily.   Yes Historical Provider, MD  simvastatin (ZOCOR) 20 MG tablet Take 20 mg by mouth daily.   Yes Historical Provider, MD  traMADol (ULTRAM) 50 MG tablet every 6 (six) hours as needed.  11/02/13  Yes Historical Provider, MD  furosemide (LASIX) 20 MG tablet Take 20 mg by mouth as needed.     Historical Provider, MD   BP  109/71 mmHg  Pulse 96  Temp(Src) 101.5 F (38.6 C) (Oral)  Resp 24  Ht 5\' 6"  (1.676 m)  Wt 155 lb (70.308 kg)  BMI 25.03 kg/m2  SpO2 96% Physical Exam  Constitutional: He is oriented to person, place, and time. He appears well-developed and well-nourished. No distress.  HENT:  Head: Normocephalic and atraumatic.  Mouth/Throat: Oropharynx is clear and moist.  Neck: Normal range of motion. Neck supple.  Cardiovascular: Normal rate, regular rhythm and normal heart sounds.   No murmur heard. Pulmonary/Chest: Effort normal. No respiratory distress. He has no wheezes.  There are rhonchi in the left base.  Abdominal: Soft. Bowel sounds are normal. He exhibits no distension. There is no tenderness.  Musculoskeletal: Normal range of motion. He exhibits no edema.  Lymphadenopathy:    He has no cervical adenopathy.  Neurological: He is alert and oriented to person, place, and time.  Skin: Skin is warm and dry. He is not diaphoretic.  Nursing note and vitals reviewed.   ED Course  Procedures (including critical care time) Labs Review Labs Reviewed  BASIC METABOLIC PANEL  CBC WITH DIFFERENTIAL/PLATELET    Imaging Review No results found. I have personally reviewed and evaluated these images and lab results as part of my medical decision-making.   EKG Interpretation None      MDM   Final diagnoses:  None    Patient presents with fever and cough that started yesterday. He is febrile with a temperature of 101.5, however there is no white count and he is nontoxic-appearing. His chest x-ray reveals no acute process, however his lung exam does have rhonchorous sounds in the left base. As the patient is 81 years old with several comorbidities, I have a lower threshold to initiate antibiotics. He will be treated with doxycycline and advised to return as needed if he develops chest pain, difficulty breathing, or other symptoms. He is in no respiratory distress, there is no hypoxia, and  his vital signs are otherwise stable.    Veryl Speak, MD 06/28/15 1210

## 2015-06-28 NOTE — ED Notes (Signed)
Pt reports history of chronic sinusitis, Sunday developed increased cough and mucous congestion associated with fever

## 2015-06-28 NOTE — ED Notes (Signed)
Pt walked back to room, a/o x 3.  Pt states he has a sinus headache since last night and fever.  Pt states he didn't check temp until this morning but had chills last night.  Pt took 1000mg  tylenol around 9am but still has temp.

## 2015-06-28 NOTE — ED Notes (Signed)
MD at bedside. 

## 2015-06-28 NOTE — Discharge Instructions (Signed)
Doxycycline as prescribed  Tylenol 1000 mg rotated with Motrin 600 mg every 4 hours as needed for fever.  Return to the emergency department if you develop difficulty breathing, severe chest pain, or other new and concerning symptoms.   Acute Bronchitis Bronchitis is inflammation of the airways that extend from the windpipe into the lungs (bronchi). The inflammation often causes mucus to develop. This leads to a cough, which is the most common symptom of bronchitis.  In acute bronchitis, the condition usually develops suddenly and goes away over time, usually in a couple weeks. Smoking, allergies, and asthma can make bronchitis worse. Repeated episodes of bronchitis may cause further lung problems.  CAUSES Acute bronchitis is most often caused by the same virus that causes a cold. The virus can spread from person to person (contagious) through coughing, sneezing, and touching contaminated objects. SIGNS AND SYMPTOMS   Cough.   Fever.   Coughing up mucus.   Body aches.   Chest congestion.   Chills.   Shortness of breath.   Sore throat.  DIAGNOSIS  Acute bronchitis is usually diagnosed through a physical exam. Your health care provider will also ask you questions about your medical history. Tests, such as chest X-rays, are sometimes done to rule out other conditions.  TREATMENT  Acute bronchitis usually goes away in a couple weeks. Oftentimes, no medical treatment is necessary. Medicines are sometimes given for relief of fever or cough. Antibiotic medicines are usually not needed but may be prescribed in certain situations. In some cases, an inhaler may be recommended to help reduce shortness of breath and control the cough. A cool mist vaporizer may also be used to help thin bronchial secretions and make it easier to clear the chest.  HOME CARE INSTRUCTIONS  Get plenty of rest.   Drink enough fluids to keep your urine clear or pale yellow (unless you have a medical  condition that requires fluid restriction). Increasing fluids may help thin your respiratory secretions (sputum) and reduce chest congestion, and it will prevent dehydration.   Take medicines only as directed by your health care provider.  If you were prescribed an antibiotic medicine, finish it all even if you start to feel better.  Avoid smoking and secondhand smoke. Exposure to cigarette smoke or irritating chemicals will make bronchitis worse. If you are a smoker, consider using nicotine gum or skin patches to help control withdrawal symptoms. Quitting smoking will help your lungs heal faster.   Reduce the chances of another bout of acute bronchitis by washing your hands frequently, avoiding people with cold symptoms, and trying not to touch your hands to your mouth, nose, or eyes.   Keep all follow-up visits as directed by your health care provider.  SEEK MEDICAL CARE IF: Your symptoms do not improve after 1 week of treatment.  SEEK IMMEDIATE MEDICAL CARE IF:  You develop an increased fever or chills.   You have chest pain.   You have severe shortness of breath.  You have bloody sputum.   You develop dehydration.  You faint or repeatedly feel like you are going to pass out.  You develop repeated vomiting.  You develop a severe headache. MAKE SURE YOU:   Understand these instructions.  Will watch your condition.  Will get help right away if you are not doing well or get worse.   This information is not intended to replace advice given to you by your health care provider. Make sure you discuss any questions you have with  your health care provider.   Document Released: 08/16/2004 Document Revised: 07/30/2014 Document Reviewed: 12/30/2012 Elsevier Interactive Patient Education [employer].

## 2015-06-30 ENCOUNTER — Other Ambulatory Visit: Payer: Self-pay | Admitting: Internal Medicine

## 2015-06-30 DIAGNOSIS — N400 Enlarged prostate without lower urinary tract symptoms: Secondary | ICD-10-CM | POA: Diagnosis not present

## 2015-06-30 DIAGNOSIS — R7309 Other abnormal glucose: Secondary | ICD-10-CM | POA: Diagnosis not present

## 2015-06-30 DIAGNOSIS — R0989 Other specified symptoms and signs involving the circulatory and respiratory systems: Secondary | ICD-10-CM | POA: Diagnosis not present

## 2015-06-30 DIAGNOSIS — G459 Transient cerebral ischemic attack, unspecified: Secondary | ICD-10-CM | POA: Diagnosis not present

## 2015-06-30 DIAGNOSIS — R600 Localized edema: Secondary | ICD-10-CM | POA: Diagnosis not present

## 2015-06-30 DIAGNOSIS — R972 Elevated prostate specific antigen [PSA]: Secondary | ICD-10-CM | POA: Diagnosis not present

## 2015-06-30 DIAGNOSIS — G2581 Restless legs syndrome: Secondary | ICD-10-CM | POA: Diagnosis not present

## 2015-06-30 DIAGNOSIS — J309 Allergic rhinitis, unspecified: Secondary | ICD-10-CM | POA: Diagnosis not present

## 2015-06-30 DIAGNOSIS — Z1389 Encounter for screening for other disorder: Secondary | ICD-10-CM | POA: Diagnosis not present

## 2015-06-30 DIAGNOSIS — M353 Polymyalgia rheumatica: Secondary | ICD-10-CM | POA: Diagnosis not present

## 2015-06-30 DIAGNOSIS — K219 Gastro-esophageal reflux disease without esophagitis: Secondary | ICD-10-CM | POA: Diagnosis not present

## 2015-06-30 DIAGNOSIS — Z0000 Encounter for general adult medical examination without abnormal findings: Secondary | ICD-10-CM | POA: Diagnosis not present

## 2015-06-30 DIAGNOSIS — R911 Solitary pulmonary nodule: Secondary | ICD-10-CM

## 2015-07-07 ENCOUNTER — Ambulatory Visit
Admission: RE | Admit: 2015-07-07 | Discharge: 2015-07-07 | Disposition: A | Discharge status: Home / Self Care | Payer: Medicare Other | Source: Ambulatory Visit | Attending: Internal Medicine | Admitting: Internal Medicine

## 2015-07-07 DIAGNOSIS — R911 Solitary pulmonary nodule: Secondary | ICD-10-CM

## 2015-07-07 DIAGNOSIS — R918 Other nonspecific abnormal finding of lung field: Secondary | ICD-10-CM | POA: Diagnosis not present

## 2015-07-22 ENCOUNTER — Other Ambulatory Visit: Payer: Self-pay | Admitting: Internal Medicine

## 2015-07-22 DIAGNOSIS — R911 Solitary pulmonary nodule: Secondary | ICD-10-CM

## 2015-10-13 DIAGNOSIS — B351 Tinea unguium: Secondary | ICD-10-CM | POA: Diagnosis not present

## 2015-10-13 DIAGNOSIS — D485 Neoplasm of uncertain behavior of skin: Secondary | ICD-10-CM | POA: Diagnosis not present

## 2015-10-13 DIAGNOSIS — L821 Other seborrheic keratosis: Secondary | ICD-10-CM | POA: Diagnosis not present

## 2015-10-22 DIAGNOSIS — H469 Unspecified optic neuritis: Secondary | ICD-10-CM

## 2015-10-22 DIAGNOSIS — M316 Other giant cell arteritis: Secondary | ICD-10-CM

## 2015-10-22 HISTORY — DX: Other giant cell arteritis: M31.6

## 2015-10-22 HISTORY — DX: Unspecified optic neuritis: H46.9

## 2015-11-08 ENCOUNTER — Inpatient Hospital Stay (HOSPITAL_COMMUNITY)
Admission: EM | Admit: 2015-11-08 | Discharge: 2015-11-10 | DRG: 123 | Disposition: A | Discharge status: Home / Self Care | Payer: Medicare Other | Attending: Family Medicine | Admitting: Family Medicine

## 2015-11-08 ENCOUNTER — Encounter (HOSPITAL_COMMUNITY): Payer: Self-pay

## 2015-11-08 DIAGNOSIS — K219 Gastro-esophageal reflux disease without esophagitis: Secondary | ICD-10-CM | POA: Diagnosis present

## 2015-11-08 DIAGNOSIS — Z7951 Long term (current) use of inhaled steroids: Secondary | ICD-10-CM

## 2015-11-08 DIAGNOSIS — Z7902 Long term (current) use of antithrombotics/antiplatelets: Secondary | ICD-10-CM | POA: Diagnosis not present

## 2015-11-08 DIAGNOSIS — R0602 Shortness of breath: Secondary | ICD-10-CM | POA: Diagnosis present

## 2015-11-08 DIAGNOSIS — I2584 Coronary atherosclerosis due to calcified coronary lesion: Secondary | ICD-10-CM

## 2015-11-08 DIAGNOSIS — R05 Cough: Secondary | ICD-10-CM | POA: Diagnosis present

## 2015-11-08 DIAGNOSIS — Z8673 Personal history of transient ischemic attack (TIA), and cerebral infarction without residual deficits: Secondary | ICD-10-CM

## 2015-11-08 DIAGNOSIS — H5462 Unqualified visual loss, left eye, normal vision right eye: Secondary | ICD-10-CM | POA: Diagnosis not present

## 2015-11-08 DIAGNOSIS — M316 Other giant cell arteritis: Secondary | ICD-10-CM

## 2015-11-08 DIAGNOSIS — R0600 Dyspnea, unspecified: Secondary | ICD-10-CM | POA: Diagnosis present

## 2015-11-08 DIAGNOSIS — R609 Edema, unspecified: Secondary | ICD-10-CM | POA: Diagnosis present

## 2015-11-08 DIAGNOSIS — B351 Tinea unguium: Secondary | ICD-10-CM | POA: Diagnosis not present

## 2015-11-08 DIAGNOSIS — Z7952 Long term (current) use of systemic steroids: Secondary | ICD-10-CM | POA: Diagnosis not present

## 2015-11-08 DIAGNOSIS — R053 Chronic cough: Secondary | ICD-10-CM | POA: Diagnosis present

## 2015-11-08 DIAGNOSIS — M315 Giant cell arteritis with polymyalgia rheumatica: Secondary | ICD-10-CM | POA: Diagnosis present

## 2015-11-08 DIAGNOSIS — H469 Unspecified optic neuritis: Principal | ICD-10-CM | POA: Diagnosis present

## 2015-11-08 DIAGNOSIS — J310 Chronic rhinitis: Secondary | ICD-10-CM | POA: Diagnosis present

## 2015-11-08 DIAGNOSIS — H468 Other optic neuritis: Secondary | ICD-10-CM | POA: Diagnosis not present

## 2015-11-08 DIAGNOSIS — G2581 Restless legs syndrome: Secondary | ICD-10-CM | POA: Diagnosis present

## 2015-11-08 DIAGNOSIS — I2510 Atherosclerotic heart disease of native coronary artery without angina pectoris: Secondary | ICD-10-CM | POA: Diagnosis not present

## 2015-11-08 DIAGNOSIS — Z87891 Personal history of nicotine dependence: Secondary | ICD-10-CM | POA: Diagnosis not present

## 2015-11-08 DIAGNOSIS — N400 Enlarged prostate without lower urinary tract symptoms: Secondary | ICD-10-CM | POA: Diagnosis present

## 2015-11-08 DIAGNOSIS — M5416 Radiculopathy, lumbar region: Secondary | ICD-10-CM | POA: Diagnosis present

## 2015-11-08 HISTORY — DX: Unspecified optic neuritis: H46.9

## 2015-11-08 HISTORY — DX: Other giant cell arteritis: M31.6

## 2015-11-08 LAB — CBC
HCT: 37.8 % — ABNORMAL LOW (ref 39.0–52.0)
Hemoglobin: 12.9 g/dL — ABNORMAL LOW (ref 13.0–17.0)
MCH: 31.6 pg (ref 26.0–34.0)
MCHC: 34.1 g/dL (ref 30.0–36.0)
MCV: 92.6 fL (ref 78.0–100.0)
Platelets: 353 10*3/uL (ref 150–400)
RBC: 4.08 MIL/uL — ABNORMAL LOW (ref 4.22–5.81)
RDW: 12.6 % (ref 11.5–15.5)
WBC: 6.9 10*3/uL (ref 4.0–10.5)

## 2015-11-08 LAB — SEDIMENTATION RATE: Sed Rate: 12 mm/hr (ref 0–16)

## 2015-11-08 LAB — C-REACTIVE PROTEIN: CRP: 1.2 mg/dL — ABNORMAL HIGH (ref <1.0)

## 2015-11-08 LAB — BASIC METABOLIC PANEL
Anion gap: 9 (ref 5–15)
BUN: 11 mg/dL (ref 6–20)
CHLORIDE: 103 mmol/L (ref 101–111)
CO2: 25 mmol/L (ref 22–32)
CREATININE: 0.76 mg/dL (ref 0.61–1.24)
Calcium: 8.6 mg/dL — ABNORMAL LOW (ref 8.9–10.3)
GFR calc Af Amer: >60 mL/min (ref >60)
GFR calc non Af Amer: >60 mL/min (ref >60)
GLUCOSE: 110 mg/dL — AB (ref 65–99)
Potassium: 3.8 mmol/L (ref 3.5–5.1)
SODIUM: 137 mmol/L (ref 135–145)

## 2015-11-08 MED ORDER — HYDROCODONE-ACETAMINOPHEN 5-325 MG PO TABS: 1.0000 | ORAL_TABLET | ORAL | Status: DC | PRN

## 2015-11-08 MED ORDER — ONDANSETRON HCL 4 MG PO TABS: 4.0000 mg | ORAL_TABLET | Freq: Four times a day (QID) | ORAL | Status: DC | PRN

## 2015-11-08 MED ORDER — ONDANSETRON HCL 4 MG/2ML IJ SOLN: 4.0000 mg | Freq: Four times a day (QID) | INTRAMUSCULAR | Status: DC | PRN

## 2015-11-08 MED ORDER — SODIUM CHLORIDE 0.9 % IV SOLN
500.0000 mg | Freq: Once | INTRAVENOUS | Status: AC
  Administered 2015-11-08: 500 mg via INTRAVENOUS
  Filled 2015-11-08: qty 4

## 2015-11-08 MED ORDER — POLYETHYLENE GLYCOL 3350 17 G PO PACK: 17.0000 g | PACK | Freq: Every day | ORAL | Status: DC | PRN

## 2015-11-08 MED ORDER — SODIUM CHLORIDE 0.9 % IV SOLN
500.0000 mg | Freq: Two times a day (BID) | INTRAVENOUS | Status: AC
  Administered 2015-11-09 – 2015-11-10 (×3): 500 mg via INTRAVENOUS
  Filled 2015-11-08 (×5): qty 4

## 2015-11-08 MED ORDER — ACETAMINOPHEN 650 MG RE SUPP: 650.0000 mg | Freq: Four times a day (QID) | RECTAL | Status: DC | PRN

## 2015-11-08 MED ORDER — TRAZODONE HCL 50 MG PO TABS: 25.0000 mg | ORAL_TABLET | Freq: Every evening | ORAL | Status: DC | PRN

## 2015-11-08 MED ORDER — MORPHINE SULFATE (PF) 2 MG/ML IV SOLN: 2.0000 mg | INTRAVENOUS | Status: DC | PRN

## 2015-11-08 MED ORDER — ACETAMINOPHEN 325 MG PO TABS: 650.0000 mg | ORAL_TABLET | Freq: Four times a day (QID) | ORAL | Status: DC | PRN

## 2015-11-08 NOTE — ED Provider Notes (Signed)
CSN: [id number]     Arrival date & time 11/08/15  1644 History   First MD Initiated Contact with Patient 11/08/15 1701     Chief Complaint  Patient presents with  . Loss of Vision     (Consider location/radiation/quality/duration/timing/severity/associated sxs/prior Treatment) HPI   Patient is a 82 year old male with past medical history of allergic rhinitis, GERD, TIA, retinal disease (right eye since birth) and polymyalgia rheumatica who presents the ED with complaint of vision loss of left eye, onset 2 weeks. Patient reports having gradual peripheral vision loss in his left eye. He reports significant worsening of vision overnight, he notes when he woke up this morning he had significant tunneling of vision in the left eye. Patient also endorses having bilateral jaw claudication and headache (frontal throbbing pain) for the past 2 weeks. Pt reports he has has vision problems in his right eye since birth, which he notes are unchanged. Pt reports he was seen by his ophthalmologist PTA and was advised to come to the ED for IV steroids due to suspected temporal arteritis. Pt denies any fever, lightheadedness, dizziness, eye redness, eye discharge, trauma/injury to eye, numbness, tingling, weakness. Denies use of contacts.   Past Medical History  Diagnosis Date  . History of BPH   . Perennial allergic rhinitis   . Restless leg syndrome   . Lumbar radiculopathy   . GERD (gastroesophageal reflux disease)     omeprazole 1-2 times daily  . Actinic keratoses   . H/O TIA (transient ischemic attack) and stroke     on statins and plavix -saw Dr.Sethi in the past  . Retinal disease     right eye since birth   . Need for shingles vaccine   . Shingles   . Colon polyps   . Dyspnea 07/13/2013    BNP normal  . History of TIA (transient ischemic attack) 07/13/2013  . Edema 07/13/2013  . Polymyalgia rheumatica ([place]) 07/13/2013    Daily prednisone  . Arthritis    Past Surgical History  Procedure  Laterality Date  . Vasectomy    . Colonoscopy     Family History  Problem Relation Age of Onset  . Stroke Mother   . Coronary artery disease Mother   . Diabetes Mother   . Breast cancer Mother   . Heart attack Father   . Coronary artery disease Father   . Graves' disease Sister   . Diabetes Father    Social History  Substance Use Topics  . Smoking status: Former Smoker -- 1.00 packs/day for 15 years    Types: Cigarettes    Quit date: 07/23/1975  . Smokeless tobacco: Never Used  . Alcohol Use: 4.2 oz/week    7 Glasses of wine per week     Comment: 1 glass red wine daily    Review of Systems  HENT:       Jaw pain  Eyes: Positive for visual disturbance.  Neurological: Positive for headaches.  All other systems reviewed and are negative.     Allergies  Review of patient's allergies indicates no known allergies.  Home Medications   Prior to Admission medications   Medication Sig Start Date End Date Taking? Authorizing Provider  acetaminophen (TYLENOL) 325 MG tablet Take 650 mg by mouth every 6 (six) hours as needed for mild pain.    Yes Historical Provider, MD  cetirizine (ZYRTEC) 10 MG tablet Take 10 mg by mouth daily.   Yes Historical Provider, MD  clopidogrel (PLAVIX) 75 MG  tablet Take 75 mg by mouth daily with breakfast.   Yes Historical Provider, MD  doxazosin (CARDURA) 8 MG tablet Take 8 mg by mouth daily.   Yes Historical Provider, MD  Fish Oil-Cholecalciferol (FISH OIL + D3 PO) Take by mouth.   Yes Historical Provider, MD  fluconazole (DIFLUCAN) 200 MG tablet Take 200 mg by mouth every 7 (seven) days. Every 7 days. wednesday   Yes Historical Provider, MD  fluticasone (VERAMYST) 27.5 MCG/SPRAY nasal spray Place 2 sprays into the nose daily.   Yes Historical Provider, MD  furosemide (LASIX) 20 MG tablet Take 20 mg by mouth as needed.    Yes Historical Provider, MD  Multiple Vitamin (MULTIVITAMIN) tablet Take 1 tablet by mouth daily.   Yes Historical Provider, MD   omeprazole (PRILOSEC) 20 MG capsule Take 20 mg by mouth daily.   Yes Historical Provider, MD  simvastatin (ZOCOR) 20 MG tablet Take 20 mg by mouth daily.   Yes Historical Provider, MD  traMADol (ULTRAM) 50 MG tablet every 6 (six) hours as needed.  11/02/13  Yes Historical Provider, MD  doxycycline (VIBRAMYCIN) 100 MG capsule Take 1 capsule (100 mg total) by mouth 2 (two) times daily. One po bid x 7 days 06/28/15   Veryl Speak, MD  fluconazole (DIFLUCAN) 100 MG tablet Take 100 mg by mouth daily.    Historical Provider, MD   BP 138/84 mmHg  Pulse 57  Temp(Src) 97.7 F (36.5 C) (Oral)  Resp 16  SpO2 99% Physical Exam  Constitutional: He is oriented to person, place, and time. He appears well-developed and well-nourished. No distress.  HENT:  Head: Normocephalic and atraumatic.  Mouth/Throat: Uvula is midline, oropharynx is clear and moist and mucous membranes are normal. No oropharyngeal exudate, posterior oropharyngeal edema, posterior oropharyngeal erythema or tonsillar abscesses.  Eyes: Conjunctivae, EOM and lids are normal. Right eye exhibits no chemosis, no discharge, no exudate and no hordeolum. No foreign body present in the right eye. Left eye exhibits no chemosis, no discharge, no exudate and no hordeolum. No foreign body present in the left eye. Right conjunctiva is not injected. Right conjunctiva has no hemorrhage. Left conjunctiva is not injected. Left conjunctiva has no hemorrhage. No scleral icterus. Right eye exhibits normal extraocular motion. Left eye exhibits normal extraocular motion. Right pupil is reactive. Left pupil is reactive.  Afferent pupillary defect bilaterally  Neck: Normal range of motion. Neck supple.  Cardiovascular: Normal rate, regular rhythm, normal heart sounds and intact distal pulses.   Pulmonary/Chest: Effort normal and breath sounds normal. No respiratory distress. He has no wheezes. He has no rales. He exhibits no tenderness.  Abdominal: Soft. He exhibits  no distension.  Musculoskeletal: He exhibits no edema.  Neurological: He is alert and oriented to person, place, and time. He has normal strength. No cranial nerve deficit or sensory deficit. He displays a negative Romberg sign. Coordination normal.  Skin: Skin is warm and dry. He is not diaphoretic.  Nursing note and vitals reviewed.   ED Course  Procedures (including critical care time) Labs Review Labs Reviewed  CBC - Abnormal; Notable for the following:    RBC 4.08 (*)    Hemoglobin 12.9 (*)    HCT 37.8 (*)    All other components within normal limits  C-REACTIVE PROTEIN - Abnormal; Notable for the following:    CRP 1.2 (*)    All other components within normal limits  BASIC METABOLIC PANEL - Abnormal; Notable for the following:    Glucose,  Bld 110 (*)    Calcium 8.6 (*)    All other components within normal limits  SEDIMENTATION RATE    Imaging Review No results found. I have personally reviewed and evaluated these images and lab results as part of my medical decision-making.   EKG Interpretation None      MDM   Final diagnoses:  Temporal arteritis ([place])    Pt presents with gradually worsening left vision loss over the past 2 weeks with progressive tunneling of vision overnight. Endorses associated bilateral jaw claudication, headache. Pt was seen by his ophthalmologist PTA and was advised to come to the [facility]. Exam revealed bilateral afferent pupillary defect.  Review of letter from pt's Ophthalmologist, Dr. Posey Pronto ([facility]) reports that pt has had progressive tunneling of left eye with afferent pupillary defect. Slit lamp exam showed cataracts in both eyes. Fundus exam showed left optic nerve edema and macular scar in right eye. OCT of left eye confirms left optic nerve edema. Recommend starting pt on IV steroids over the next 2 days (500mg  tonight), 1000mg  tomorrow divided in 2 doses and 500mg  on Thursday morning prior to d/c.   Consulted hospitalist. Dr.  Aggie Moats agrees to admission but requested ophthalmology consult. Consulted ophthalmology. I spoke with Dr. Posey Pronto, who reported that he was the provider who sent the pt to the ED for further management. Recommended to continue with tx plan discussed above. Advised hospitalist. Discussed results and plan with pt.   Chesley Noon [place], [place] 11/08/15 2008  Carmin Muskrat, MD 11/08/15 [phone]

## 2015-11-08 NOTE — ED Notes (Signed)
Requested hospital bed from [facility] 

## 2015-11-08 NOTE — ED Notes (Addendum)
Pt referred to ED by [facility] for acute progressive vision loss in the left eye (progressive tunneling of vision in left eye) and bilateral jaw claudification for onset x 2 weeks with progressive worsening since yesterday.  Pt has h/o right eye vision problems since birth and has not noticed any changes.

## 2015-11-08 NOTE — ED Notes (Signed)
MD at bedside. 

## 2015-11-09 ENCOUNTER — Encounter (HOSPITAL_COMMUNITY): Payer: Self-pay | Admitting: General Practice

## 2015-11-09 DIAGNOSIS — R05 Cough: Secondary | ICD-10-CM

## 2015-11-09 DIAGNOSIS — H469 Unspecified optic neuritis: Principal | ICD-10-CM

## 2015-11-09 DIAGNOSIS — B351 Tinea unguium: Secondary | ICD-10-CM | POA: Diagnosis present

## 2015-11-09 LAB — CBC
HEMATOCRIT: 35.7 % — AB (ref 39.0–52.0)
HEMOGLOBIN: 12.2 g/dL — AB (ref 13.0–17.0)
MCH: 31.3 pg (ref 26.0–34.0)
MCHC: 34.2 g/dL (ref 30.0–36.0)
MCV: 91.5 fL (ref 78.0–100.0)
Platelets: 344 10*3/uL (ref 150–400)
RBC: 3.9 MIL/uL — ABNORMAL LOW (ref 4.22–5.81)
RDW: 12.5 % (ref 11.5–15.5)
WBC: 6.2 10*3/uL (ref 4.0–10.5)

## 2015-11-09 LAB — BASIC METABOLIC PANEL
ANION GAP: 9 (ref 5–15)
BUN: 12 mg/dL (ref 6–20)
CHLORIDE: 106 mmol/L (ref 101–111)
CO2: 22 mmol/L (ref 22–32)
Calcium: 8.6 mg/dL — ABNORMAL LOW (ref 8.9–10.3)
Creatinine, Ser: 0.88 mg/dL (ref 0.61–1.24)
GFR calc Af Amer: >60 mL/min (ref >60)
GFR calc non Af Amer: >60 mL/min (ref >60)
GLUCOSE: 228 mg/dL — AB (ref 65–99)
POTASSIUM: 3.7 mmol/L (ref 3.5–5.1)
Sodium: 137 mmol/L (ref 135–145)

## 2015-11-09 MED ORDER — FLUCONAZOLE 100 MG PO TABS
200.0000 mg | ORAL_TABLET | ORAL | Status: DC
  Administered 2015-11-09: 200 mg via ORAL
  Filled 2015-11-09: qty 2

## 2015-11-09 MED ORDER — ENOXAPARIN SODIUM 40 MG/0.4ML ~~LOC~~ SOLN
40.0000 mg | SUBCUTANEOUS | Status: DC
  Administered 2015-11-09: 40 mg via SUBCUTANEOUS
  Filled 2015-11-09: qty 0.4

## 2015-11-09 MED ORDER — DOXAZOSIN MESYLATE 8 MG PO TABS
8.0000 mg | ORAL_TABLET | Freq: Every day | ORAL | Status: DC
  Administered 2015-11-09 – 2015-11-10 (×2): 8 mg via ORAL
  Filled 2015-11-09 (×3): qty 1

## 2015-11-09 MED ORDER — CLOPIDOGREL BISULFATE 75 MG PO TABS
75.0000 mg | ORAL_TABLET | Freq: Every day | ORAL | Status: DC
  Administered 2015-11-09 – 2015-11-10 (×2): 75 mg via ORAL
  Filled 2015-11-09 (×2): qty 1

## 2015-11-09 MED ORDER — FLUTICASONE FUROATE 27.5 MCG/SPRAY NA SUSP: 2.0000 | Freq: Every day | NASAL | Status: DC

## 2015-11-09 MED ORDER — LORATADINE 10 MG PO TABS
10.0000 mg | ORAL_TABLET | Freq: Every day | ORAL | Status: DC
Start: 2015-11-09 — End: 2015-11-10
  Filled 2015-11-09: qty 1

## 2015-11-09 MED ORDER — SIMVASTATIN 20 MG PO TABS
20.0000 mg | ORAL_TABLET | Freq: Every day | ORAL | Status: DC
  Administered 2015-11-09 – 2015-11-10 (×2): 20 mg via ORAL
  Filled 2015-11-09 (×3): qty 1

## 2015-11-09 MED ORDER — PANTOPRAZOLE SODIUM 40 MG PO TBEC
40.0000 mg | DELAYED_RELEASE_TABLET | Freq: Every day | ORAL | Status: DC
  Administered 2015-11-09 – 2015-11-10 (×2): 40 mg via ORAL
  Filled 2015-11-09 (×2): qty 1

## 2015-11-09 NOTE — Progress Notes (Addendum)
Pt states has been "seeing things".  He states that it appears people are coming up beside him trying to steal his food and that earlier his bed was painted purple.  He however states that he is aware none of it is real and pt is alert and oriented x 4.  MD notified.  Will continue to monitor.  Eliezer [place]

## 2015-11-09 NOTE — ED Notes (Signed)
Dr. Darrick Meigs MD at bedside.

## 2015-11-09 NOTE — ED Notes (Signed)
Pt pulled emergency button in room; RN went to check on pt; pt was verbally disrespectful to RN; pt states he has a critical med that was due at [place] and somebody needs to get the med here ASAP; [employer] and security responded due to emergency button being pushed; pt was verbally loud with RN and demanded his medication; RN has been in contact with Pharmacy prior to this time to get med sent;

## 2015-11-09 NOTE — Progress Notes (Signed)
Subjective: Patient admitted this morning, see dictated H&P by Dr. Aggie Moats. a 82 y.o. male past history of BPH, allergic rhinitis, restless leg, TIA, shingles, dyspnea, edema, radiculopathy and polymyalgia rheumatica. Presents to the emergency room with chief complaint of vision loss. Patient has long-standing history of right vision difficulty limited peripheral vision. Patient states that acutely he began to develop left eye issues as well. Patient states his peripheral vision in left eye start to shrink became 2. [name]'s with a pinhole. He will see Dr. Posey Pronto with ophthalmology as outpatient. Sent him emergency room to get IV steroids as an inpatient. Patient was sent over with a letter describing the treatment he wanted. ED physician called and discussed with ophthalmology Dr. Posey Pronto who recommended IV steroids for optic neuritis.   Filed Vitals:   11/09/15 1025 11/09/15 1300  BP: 143/62 118/66  Pulse: 85 100  Temp: 97.7 F (36.5 C) 97.7 F (36.5 C)  Resp: 18 18    Chest: Clear Bilaterally Heart : S1S2 RRR Abdomen: Soft, nontender Ext : No edema Neuro: Alert, oriented x 3  A/P  Optic neurits Patient currently on high-dose Solu-Medrol 500 mg IV every 12 hours. For total 3 doses. Last dose on 11/10/2015.   Coronary artery disease Stable, continue Plavix  Onychomycosis  continue weekly fluconazole  BPH Continue Cardura    Stuart Watson [facility] [phone]

## 2015-11-09 NOTE — H&P (Signed)
[facility] History and Physical  Stuart Watson [id number] DOB: 09-07-1931 DOA: 11/08/2015  Referring physician:  PCP: Wenda Low, MD   Chief Complaint: "I was told to come to the hospital."  HPI: Stuart Watson is a 82 y.o. male past history of BPH, allergic rhinitis, restless leg, TIA, shingles, dyspnea, edema, radiculopathy and polymyalgia rheumatica. Presents to the emergency room with chief complaint of vision loss. Patient has long-standing history of right vision difficulty limited peripheral vision. Patient states that acutely he began to develop left eye issues as well. Patient states his peripheral vision in left eye start to shrink became 2. Melannie Metzner's with a pinhole. He will see Dr. Posey Pronto with ophthalmology as outpatient. Sent him emergency room to get IV steroids as an inpatient. Patient was sent over with a letter describing the treatment he wanted. He Dr. relate that his diagnosis is optic neuritis.  Patient denies any headache, trouble hearing, trouble speaking, chest pain, shortness of breath, nausea, vomiting, no abdominal pain, cough, wheeze, dysuria, weakness.  CODE STATUS full   Review of Systems:  Pertinent review of systems in history of present illness. All other systems were reviewed and are negative.  Past Medical History  Diagnosis Date  . History of BPH   . Perennial allergic rhinitis   . Restless leg syndrome   . Lumbar radiculopathy   . GERD (gastroesophageal reflux disease)     omeprazole 1-2 times daily  . Actinic keratoses   . H/O TIA (transient ischemic attack) and stroke     on statins and plavix -saw Dr.Sethi in the past  . Retinal disease     right eye since birth   . Need for shingles vaccine   . Shingles   . Colon polyps   . Dyspnea 07/13/2013    BNP normal  . History of TIA (transient ischemic attack) 07/13/2013  . Edema 07/13/2013  . Polymyalgia rheumatica ([place]) 07/13/2013    Daily prednisone  . Arthritis    Past  Surgical History  Procedure Laterality Date  . Vasectomy    . Colonoscopy     Social History:  reports that he quit smoking about 40 years ago. His smoking use included Cigarettes. He has a 15 pack-year smoking history. He has never used smokeless tobacco. He reports that he drinks about 4.2 oz of alcohol per week. He reports that he does not use illicit drugs.  No Known Allergies  Family History  Problem Relation Age of Onset  . Stroke Mother   . Coronary artery disease Mother   . Diabetes Mother   . Breast cancer Mother   . Heart attack Father   . Coronary artery disease Father   . Graves' disease Sister   . Diabetes Father      Prior to Admission medications   Medication Sig Start Date End Date Taking? Authorizing Provider  acetaminophen (TYLENOL) 325 MG tablet Take 650 mg by mouth every 6 (six) hours as needed for mild pain.    Yes Historical Provider, MD  cetirizine (ZYRTEC) 10 MG tablet Take 10 mg by mouth daily.   Yes Historical Provider, MD  clopidogrel (PLAVIX) 75 MG tablet Take 75 mg by mouth daily with breakfast.   Yes Historical Provider, MD  doxazosin (CARDURA) 8 MG tablet Take 8 mg by mouth daily.   Yes Historical Provider, MD  Fish Oil-Cholecalciferol (FISH OIL + D3 PO) Take by mouth.   Yes Historical Provider, MD  fluconazole (DIFLUCAN) 200 MG tablet Take 200 mg  by mouth every 7 (seven) days. Every 7 days. wednesday   Yes Historical Provider, MD  fluticasone (VERAMYST) 27.5 MCG/SPRAY nasal spray Place 2 sprays into the nose daily.   Yes Historical Provider, MD  furosemide (LASIX) 20 MG tablet Take 20 mg by mouth as needed.    Yes Historical Provider, MD  Multiple Vitamin (MULTIVITAMIN) tablet Take 1 tablet by mouth daily.   Yes Historical Provider, MD  omeprazole (PRILOSEC) 20 MG capsule Take 20 mg by mouth daily.   Yes Historical Provider, MD  simvastatin (ZOCOR) 20 MG tablet Take 20 mg by mouth daily.   Yes Historical Provider, MD  traMADol (ULTRAM) 50 MG tablet  every 6 (six) hours as needed.  11/02/13  Yes Historical Provider, MD  doxycycline (VIBRAMYCIN) 100 MG capsule Take 1 capsule (100 mg total) by mouth 2 (two) times daily. One po bid x 7 days 06/28/15   Veryl Speak, MD  fluconazole (DIFLUCAN) 100 MG tablet Take 100 mg by mouth daily.    Historical Provider, MD   Physical Exam: Filed Vitals:   11/08/15 2315 11/09/15 0000 11/09/15 0100 11/09/15 0147  BP: 114/60 118/55 119/50 107/45  Pulse: 77 70 70 66  Temp:      TempSrc:      Resp:    16  SpO2: 95% 97% 97% 93%    Wt Readings from Last 3 Encounters:  06/28/15 70.308 kg (155 lb)  06/14/14 70.308 kg (155 lb)  04/02/14 71.487 kg (157 lb 9.6 oz)    General:  Appears calm and comfortable Eyes:  PERRL, EOMI, normal lids, iris ENT:  grossly normal hearing, lips & tongue Neck:  no LAD, masses or thyromegaly Cardiovascular:  RRR, no m/r/g. No LE edema.  Respiratory:  CTA bilaterally, no w/r/r. Normal respiratory effort. Abdomen:  soft, ntnd Skin:  no rash or induration seen on limited exam Musculoskeletal:  grossly normal tone BUE/BLE Psychiatric:  grossly normal mood and affect, speech fluent and appropriate Neurologic:  CN 3-12 grossly intact, moves all extremities in coordinated fashion. Decr vision grossly in R eye. Loss of peripheral vision in L eye.          Labs on Admission:  Basic Metabolic Panel:  Recent Labs Lab 11/08/15 1708 11/09/15 0220  NA 137 137  K 3.8 3.7  CL 103 106  CO2 25 22  GLUCOSE 110* 228*  BUN 11 12  CREATININE 0.76 0.88  CALCIUM 8.6* 8.6*   Liver Function Tests: No results for input(s): AST, ALT, ALKPHOS, BILITOT, PROT, ALBUMIN in the last 168 hours. No results for input(s): LIPASE, AMYLASE in the last 168 hours. No results for input(s): AMMONIA in the last 168 hours. CBC:  Recent Labs Lab 11/08/15 1708 11/09/15 0220  WBC 6.9 6.2  HGB 12.9* 12.2*  HCT 37.8* 35.7*  MCV 92.6 91.5  PLT 353 344   Cardiac Enzymes: No results for input(s):  CKTOTAL, CKMB, CKMBINDEX, TROPONINI in the last 168 hours.  BNP (last 3 results) No results for input(s): BNP in the last 8760 hours.  ProBNP (last 3 results) No results for input(s): PROBNP in the last 8760 hours.   Creatinine clearance cannot be calculated (Unknown ideal weight.)  CBG: No results for input(s): GLUCAP in the last 168 hours.  Radiological Exams on Admission: No results found.  EKG: Pending  Assessment/Plan Principal Problem:   Optic neuritis Active Problems:   Dyspnea   Edema   Coronary artery calcification   Chronic cough   Nail fungus  Neuritis -Assessed by ophthalmology as an outpatient High-dose steroids as ordered by Dr. Posey Pronto, last dose Thursday morning Dr. Posey Pronto has no other recommendations.  Dyspnea chronic -We'll continue monitor  Coronary artery disease -Continue Plavix  Chronic cough -No hypoxia or breathing issues will continue monitor  Nail fungus -we'll continue patient's outpatient antifungal.  Edema -We'll hold patient's when necessary Lasix for now  BPH -Continue patient's Cardura  Rhinitis -  continue patient's Flonase and switch to Claritin for patient's Zyrtec  Code Status:  Full code DVT Prophylaxis: teds Family Communication:  Disposition Plan: Pending Improvement    Elwin Mocha, MD Family Medicine [facility] [email or web address] Password [email or web address]

## 2015-11-09 NOTE — ED Notes (Signed)
Pt pushes call light to demand his med be given; RN explained that med must come from pharmacy and has not been delivered yet; pt states he can go blind if he does not get med and get it on time; RN advised pt that it is only 6:15am and Pharmacy is working to get med sent; pt demanded to call wife; Pt called wife and advised that they will need to speak with lawyer to get pharmacy to send meds on time when they are due; RN tried to reassure pt that pharmacy and RN is doing all we can do to ensure he gets med; Pt is back in room;

## 2015-11-09 NOTE — ED Notes (Signed)
Attempted report 

## 2015-11-10 DIAGNOSIS — I2510 Atherosclerotic heart disease of native coronary artery without angina pectoris: Secondary | ICD-10-CM

## 2015-11-10 DIAGNOSIS — H468 Other optic neuritis: Secondary | ICD-10-CM | POA: Diagnosis not present

## 2015-11-10 DIAGNOSIS — Z7952 Long term (current) use of systemic steroids: Secondary | ICD-10-CM | POA: Diagnosis not present

## 2015-11-10 DIAGNOSIS — H5462 Unqualified visual loss, left eye, normal vision right eye: Secondary | ICD-10-CM | POA: Diagnosis not present

## 2015-11-10 DIAGNOSIS — B351 Tinea unguium: Secondary | ICD-10-CM

## 2015-11-10 DIAGNOSIS — I2584 Coronary atherosclerosis due to calcified coronary lesion: Secondary | ICD-10-CM

## 2015-11-10 DIAGNOSIS — Z87891 Personal history of nicotine dependence: Secondary | ICD-10-CM | POA: Diagnosis not present

## 2015-11-10 NOTE — Discharge Summary (Signed)
Physician Discharge Summary  Stuart Watson [id number] DOB: [birth year] DOA: 11/08/2015  PCP: Wenda Low, MD  Admit date: 11/08/2015 Discharge date: 11/10/2015  Time spent: 25* minutes  Recommendations for Outpatient Follow-up:  1. Follow up PCP in 2 weeks   Discharge Diagnoses:  Principal Problem:   Optic neuritis Active Problems:   Dyspnea   Edema   Coronary artery calcification   Chronic cough   Nail fungus   Discharge Condition: Stable  Diet recommendation: Regular diet  Filed Weights   11/09/15 1025  Weight: 68.947 kg (152 lb)    History of present illness:  82 y.o. male past history of BPH, allergic rhinitis, restless leg, TIA, shingles, dyspnea, edema, radiculopathy and polymyalgia rheumatica. Presents to the emergency room with chief complaint of vision loss. Patient has long-standing history of right vision difficulty limited peripheral vision. Patient states that acutely he began to develop left eye issues as well. Patient states his peripheral vision in left eye start to shrink became 2. [name]'s with a pinhole. He will see Dr. Posey Pronto with ophthalmology as outpatient. Sent him emergency room to get IV steroids as an inpatient. Patient was sent over with a letter describing the treatment he wanted. ED physician called and discussed with ophthalmology Dr. Posey Pronto who recommended IV steroids for optic neuritis.  Hospital Course:    Optic neurits Patient currently on high-dose Solu-Medrol 500 mg IV every 12 hours. For total 3 doses. Last dose on 11/10/2015. I called and discussed with Dr Posey Pronto, who recommends to discharge today, and patient to follow up Dr Hassell Done at [facility].   Coronary artery disease Stable, continue Plavix  Onychomycosis  continue weekly fluconazole  BPH Continue Cardura   Procedures:  None  Consultations:  Ophthalmology  Discharge Exam: Filed Vitals:   11/09/15 2128 11/10/15 0609  BP: 130/57 127/52  Pulse: 61 63  Temp:  97.8 F (36.6 C) 97.8 F (36.6 C)  Resp: 18 18    General: Appears in no acute distress Cardiovascular: S1S2 RRR Respiratory: Clear bilaterally  Discharge Instructions   Discharge Instructions    Diet - low sodium heart healthy    Complete by:  As directed      Increase activity slowly    Complete by:  As directed           Current Discharge Medication List    CONTINUE these medications which have NOT CHANGED   Details  acetaminophen (TYLENOL) 325 MG tablet Take 650 mg by mouth every 6 (six) hours as needed for mild pain.     cetirizine (ZYRTEC) 10 MG tablet Take 10 mg by mouth daily.    clopidogrel (PLAVIX) 75 MG tablet Take 75 mg by mouth daily with breakfast.    doxazosin (CARDURA) 8 MG tablet Take 8 mg by mouth daily.    Fish Oil-Cholecalciferol (FISH OIL + D3 PO) Take by mouth.    fluconazole (DIFLUCAN) 200 MG tablet Take 200 mg by mouth every 7 (seven) days. Every 7 days. wednesday    fluticasone (VERAMYST) 27.5 MCG/SPRAY nasal spray Place 2 sprays into the nose daily.    furosemide (LASIX) 20 MG tablet Take 20 mg by mouth as needed.     Multiple Vitamin (MULTIVITAMIN) tablet Take 1 tablet by mouth daily.    omeprazole (PRILOSEC) 20 MG capsule Take 20 mg by mouth daily.    simvastatin (ZOCOR) 20 MG tablet Take 20 mg by mouth daily.    traMADol (ULTRAM) 50 MG tablet every 6 (six) hours as  needed.       STOP taking these medications     doxycycline (VIBRAMYCIN) 100 MG capsule        No Known Allergies Follow-up Information    Follow up with Wenda Low, MD In 2 weeks.   Specialty:  Internal Medicine   Contact information:   [address] [place] [address] [phone]        The results of significant diagnostics from this hospitalization (including imaging, microbiology, ancillary and laboratory) are listed below for reference.     Labs: Basic Metabolic Panel:  Recent Labs Lab 11/08/15 1708 11/09/15 0220  NA 137  137  K 3.8 3.7  CL 103 106  CO2 25 22  GLUCOSE 110* 228*  BUN 11 12  CREATININE 0.76 0.88  CALCIUM 8.6* 8.6*   CBC:  Recent Labs Lab 11/08/15 1708 11/09/15 0220  WBC 6.9 6.2  HGB 12.9* 12.2*  HCT 37.8* 35.7*  MCV 92.6 91.5  PLT 353 344     Signed:  Millee Denise S MD.  [facility] 11/10/2015, 9:10 AM

## 2015-11-10 NOTE — Progress Notes (Signed)
Patient discharged to home, discharge instructions were reviewed with patient and copy of AVS given to patient. Patient d/c'd via wheelchair by hospital volunteer, accompanied by spouse. Patient agreed and verbalized understanding. No further questions at this moment.   Vergia Alberts n  11/10/2015 10:09 AM

## 2015-11-17 DIAGNOSIS — H469 Unspecified optic neuritis: Secondary | ICD-10-CM | POA: Diagnosis not present

## 2015-11-21 DIAGNOSIS — I663 Occlusion and stenosis of cerebellar arteries: Secondary | ICD-10-CM | POA: Diagnosis not present

## 2015-11-21 DIAGNOSIS — M316 Other giant cell arteritis: Secondary | ICD-10-CM | POA: Diagnosis not present

## 2015-11-21 DIAGNOSIS — I776 Arteritis, unspecified: Secondary | ICD-10-CM | POA: Diagnosis not present

## 2015-11-21 DIAGNOSIS — H469 Unspecified optic neuritis: Secondary | ICD-10-CM | POA: Diagnosis not present

## 2015-12-06 DIAGNOSIS — Z87891 Personal history of nicotine dependence: Secondary | ICD-10-CM | POA: Diagnosis not present

## 2015-12-06 DIAGNOSIS — H2513 Age-related nuclear cataract, bilateral: Secondary | ICD-10-CM | POA: Diagnosis not present

## 2015-12-06 DIAGNOSIS — H31011 Macula scars of posterior pole (postinflammatory) (post-traumatic), right eye: Secondary | ICD-10-CM | POA: Diagnosis not present

## 2015-12-06 DIAGNOSIS — H04123 Dry eye syndrome of bilateral lacrimal glands: Secondary | ICD-10-CM | POA: Diagnosis not present

## 2015-12-06 DIAGNOSIS — H47012 Ischemic optic neuropathy, left eye: Secondary | ICD-10-CM | POA: Diagnosis not present

## 2015-12-27 ENCOUNTER — Ambulatory Visit
Admission: RE | Admit: 2015-12-27 | Discharge: 2015-12-27 | Disposition: A | Discharge status: Home / Self Care | Payer: Medicare Other | Source: Ambulatory Visit | Attending: Internal Medicine | Admitting: Internal Medicine

## 2015-12-27 DIAGNOSIS — R911 Solitary pulmonary nodule: Secondary | ICD-10-CM

## 2015-12-29 DIAGNOSIS — H469 Unspecified optic neuritis: Secondary | ICD-10-CM | POA: Diagnosis not present

## 2015-12-29 DIAGNOSIS — H539 Unspecified visual disturbance: Secondary | ICD-10-CM | POA: Diagnosis not present

## 2015-12-29 DIAGNOSIS — J158 Pneumonia due to other specified bacteria: Secondary | ICD-10-CM | POA: Diagnosis not present

## 2015-12-29 DIAGNOSIS — M353 Polymyalgia rheumatica: Secondary | ICD-10-CM | POA: Diagnosis not present

## 2015-12-30 ENCOUNTER — Ambulatory Visit (INDEPENDENT_AMBULATORY_CARE_PROVIDER_SITE_OTHER): Payer: Medicare Other | Admitting: Pulmonary Disease

## 2015-12-30 ENCOUNTER — Encounter: Payer: Self-pay | Admitting: Pulmonary Disease

## 2015-12-30 VITALS — BP 118/70 | HR 75 | Ht 67.5 in | Wt 154.0 lb

## 2015-12-30 DIAGNOSIS — K2100 Gastro-esophageal reflux disease with esophagitis, without bleeding: Secondary | ICD-10-CM

## 2015-12-30 DIAGNOSIS — J690 Pneumonitis due to inhalation of food and vomit: Secondary | ICD-10-CM | POA: Diagnosis not present

## 2015-12-30 DIAGNOSIS — R918 Other nonspecific abnormal finding of lung field: Secondary | ICD-10-CM | POA: Diagnosis not present
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
## 2015-12-30 NOTE — Progress Notes (Signed)
   Subjective:    Patient ID: Stuart Watson, male    DOB: 1932/03/25, 82 y.o.   MRN: [id number]  HPI    Review of Systems  Constitutional: Negative for fever and unexpected weight change.  HENT: Negative for congestion, dental problem, ear pain, nosebleeds, postnasal drip, rhinorrhea, sinus pressure, sneezing, sore throat and trouble swallowing.   Eyes: Negative for redness and itching.  Respiratory: Negative for cough, chest tightness, shortness of breath and wheezing.   Cardiovascular: Positive for leg swelling ( feet swelling). Negative for palpitations.  Gastrointestinal: Negative for nausea and vomiting.       Acid heartburn  Genitourinary: Negative for dysuria.  Musculoskeletal: Negative for joint swelling.  Skin: Negative for rash.  Neurological: Negative for headaches.  Hematological: Does not bruise/bleed easily.  Psychiatric/Behavioral: Negative for dysphoric mood. The patient is not nervous/anxious.        Objective:   Physical Exam        Assessment & Plan:

## 2015-12-30 NOTE — Patient Instructions (Signed)
Follow up in 6 to 8 weeks with Dr. [name] or Nurse practitioner  You will need a chest xray at your next appointment

## 2016-01-23 DIAGNOSIS — H539 Unspecified visual disturbance: Secondary | ICD-10-CM | POA: Diagnosis not present

## 2016-01-23 DIAGNOSIS — H469 Unspecified optic neuritis: Secondary | ICD-10-CM | POA: Diagnosis not present

## 2016-01-23 DIAGNOSIS — M353 Polymyalgia rheumatica: Secondary | ICD-10-CM | POA: Diagnosis not present

## 2016-01-31 DIAGNOSIS — K219 Gastro-esophageal reflux disease without esophagitis: Secondary | ICD-10-CM | POA: Diagnosis not present

## 2016-01-31 DIAGNOSIS — H469 Unspecified optic neuritis: Secondary | ICD-10-CM | POA: Diagnosis not present

## 2016-01-31 DIAGNOSIS — R05 Cough: Secondary | ICD-10-CM | POA: Diagnosis not present

## 2016-02-14 DIAGNOSIS — Z23 Encounter for immunization: Secondary | ICD-10-CM | POA: Diagnosis not present

## 2016-02-14 DIAGNOSIS — J309 Allergic rhinitis, unspecified: Secondary | ICD-10-CM | POA: Diagnosis not present

## 2016-02-22 ENCOUNTER — Encounter: Payer: Self-pay | Admitting: Adult Health

## 2016-02-22 ENCOUNTER — Ambulatory Visit (INDEPENDENT_AMBULATORY_CARE_PROVIDER_SITE_OTHER)
Admission: RE | Admit: 2016-02-22 | Discharge: 2016-02-22 | Disposition: A | Discharge status: Home / Self Care | Payer: Medicare Other | Source: Ambulatory Visit | Attending: Adult Health | Admitting: Adult Health

## 2016-02-22 ENCOUNTER — Ambulatory Visit (INDEPENDENT_AMBULATORY_CARE_PROVIDER_SITE_OTHER): Payer: Medicare Other | Admitting: Adult Health

## 2016-02-22 VITALS — BP 114/66 | HR 78 | Temp 97.7°F | Ht 67.0 in | Wt 156.0 lb

## 2016-02-22 DIAGNOSIS — J189 Pneumonia, unspecified organism: Secondary | ICD-10-CM

## 2016-02-22 DIAGNOSIS — R911 Solitary pulmonary nodule: Secondary | ICD-10-CM | POA: Diagnosis not present

## 2016-02-22 DIAGNOSIS — J690 Pneumonitis due to inhalation of food and vomit: Secondary | ICD-10-CM | POA: Diagnosis not present

## 2016-02-22 DIAGNOSIS — R918 Other nonspecific abnormal finding of lung field: Secondary | ICD-10-CM | POA: Diagnosis not present

## 2016-02-22 NOTE — Progress Notes (Signed)
Subjective:    Patient ID: Stuart Watson, male    DOB: January 25, 1932, 82 y.o.   MRN: [id number]  HPI 82 yo male former smoker with chronic cough and lung nodules.   TEST  Echo 07/27/13 >> EF 60 to 65% PFT 04/21/14 >> FEV1 2.58 (98%), FEV1% 73, TLC 6.45 (96%), DLCO 74%, no BD CT chest 12/27/15 >> patchy consolidation LLL, GGO 1.8 cm RUL, patchy GGO RML and RLL  02/22/2016 Follow up: Aspiration PNA  Pt returns for 2 month follow up . Seen last ov with with abnormal CT chest , increased reflux and cough .  Recently treated for temporal arteritis /optic neuritis with vision changes w/ steroids.  He had repeat CT chest on 12/27/15 to follow lung nodules  which showed new Lt lower lung consolidation and various areas of new ground glass opacification.  Most of his other lung nodules were stable. He was started on  Avelox by PCP . He was suspected to have  possible aspiration pneumonitis and instructed to finish Avelox. He is feeling better. He is off steroids for 2 weeks. GERD is better. Cough is also improved.  CXR today shows some scarring in LLL at the site of the prior pneumonia on CT . We discussed will need follow up CT chest in 6 months. He denies fever, chest pain, orthopnea or edema.      Past Medical History:  Diagnosis Date  . Actinic keratoses   . Arthritis   . Colon polyps   . Dyspnea 07/13/2013   BNP normal  . Edema 07/13/2013  . GERD (gastroesophageal reflux disease)    omeprazole 1-2 times daily  . H/O TIA (transient ischemic attack) and stroke    on statins and plavix -saw Dr.Sethi in the past  . History of BPH   . History of TIA (transient ischemic attack) 07/13/2013  . Lumbar radiculopathy   . Need for shingles vaccine   . Optic neuritis 10/2015   left eye  . Perennial allergic rhinitis   . Polymyalgia rheumatica ([place]) 07/13/2013   Daily prednisone  . Restless leg syndrome   . Retinal disease    right eye since birth   . Shingles   . Temporal arteritis ([place])  10/2015   Current Outpatient Prescriptions on File Prior to Visit  Medication Sig Dispense Refill  . acetaminophen (TYLENOL) 325 MG tablet Take 650 mg by mouth every 6 (six) hours as needed for mild pain.     . cetirizine (ZYRTEC) 10 MG tablet Take 10 mg by mouth daily. Reported on 12/30/2015    . clopidogrel (PLAVIX) 75 MG tablet Take 75 mg by mouth daily with breakfast.    . doxazosin (CARDURA) 8 MG tablet Take 8 mg by mouth daily.    . Fish Oil-Cholecalciferol (FISH OIL + D3 PO) Take by mouth.    . fluconazole (DIFLUCAN) 200 MG tablet Take 200 mg by mouth every 7 (seven) days. Every 7 days. wednesday    . fluticasone (VERAMYST) 27.5 MCG/SPRAY nasal spray Place 2 sprays into the nose daily.    . furosemide (LASIX) 20 MG tablet Take 20 mg by mouth as needed.     . Multiple Vitamin (MULTIVITAMIN) tablet Take 1 tablet by mouth daily.    Marland Kitchen omeprazole (PRILOSEC) 20 MG capsule Take 20 mg by mouth daily.    . simvastatin (ZOCOR) 20 MG tablet Take 20 mg by mouth daily.     No current facility-administered medications on file prior to visit.  Review of Systems .Constitutional:   No  weight loss, night sweats,  Fevers, chills,  +fatigue, or  lassitude.  HEENT:   No headaches,  Difficulty swallowing,  Tooth/dental problems, or  Sore throat,                No sneezing, itching, ear ache, nasal congestion, post nasal drip,   CV:  No chest pain,  Orthopnea, PND, swelling in lower extremities, anasarca, dizziness, palpitations, syncope.   GI  No heartburn, indigestion, abdominal pain, nausea, vomiting, diarrhea, change in bowel habits, loss of appetite, bloody stools.   Resp:   No excess mucus, no productive cough,  No non-productive cough,  No coughing up of blood.  No change in color of mucus.  No wheezing.  No chest wall deformity  Skin: no rash or lesions.  GU: no dysuria, change in color of urine, no urgency or frequency.  No flank pain, no hematuria   MS:  No joint pain or swelling.   No decreased range of motion.  No back pain.  Psych:  No change in mood or affect. No depression or anxiety.  No memory loss.         Objective:   Physical Exam Vitals:   02/22/16 1117  BP: 114/66  Pulse: 78  Temp: 97.7 F (36.5 C)  TempSrc: Oral  SpO2: 98%  Weight: 156 lb (70.8 kg)  Height: 5\' 7"  (1.702 m)   GEN: A/Ox3; pleasant , NAD, elderly    HEENT:  [place]/AT,  EACs-clear, TMs-wnl, NOSE-clear, THROAT-clear, no lesions, no postnasal drip or exudate noted.   NECK:  Supple w/ fair ROM; no JVD; normal carotid impulses w/o bruits; no thyromegaly or nodules palpated; no lymphadenopathy.    RESP  Clear  P & A; w/o, wheezes/ rales/ or rhonchi. no accessory muscle use, no dullness to percussion  CARD:  RRR, no m/r/g  , no peripheral edema, pulses intact, no cyanosis or clubbing.  GI:   Soft & nt; nml bowel sounds; no organomegaly or masses detected.   Musco: Warm bil, no deformities or joint swelling noted.   Neuro: alert, no focal deficits noted.    Skin: Warm, no lesions or rashes  CXR 02/22/2016   Scarring in the left lower lobe at the site of the prior pneumonia originally diagnosed in early June, 2017. 2. The ground-glass nodules in the right lung identified on the June, 2017 CT are inconspicuous on the chest x-ray, though ground-glass nodules are not hallway seen on chest x-ray  Meredyth Hornung NP-C  [facility]  02/22/2016         Assessment & Plan:

## 2016-02-22 NOTE — Progress Notes (Signed)
Reviewed and agree with assessment/plan.  Chesley Mires, MD [facility] Pulmonary/Critical Care 02/22/2016, 4:30 PM Pager:  [phone]

## 2016-02-22 NOTE — Patient Instructions (Signed)
Follow up CT chest in 6 month  Follow up with Dr. [name] in 6 months after CT chest .  Follow up GI next week as planned GERD diet.

## 2016-02-22 NOTE — Assessment & Plan Note (Signed)
Clinically improved. CXR shows improvement with residual scarring  Will repeat CT chest in 6 months (Dec 2017 )   Plan Follow up CT chest in 6 month  Follow up with Dr. [name] in 6 months after CT chest .  Follow up GI next week as planned  GERD diet.

## 2016-02-24 DIAGNOSIS — H47012 Ischemic optic neuropathy, left eye: Secondary | ICD-10-CM | POA: Diagnosis not present

## 2016-02-24 DIAGNOSIS — H524 Presbyopia: Secondary | ICD-10-CM | POA: Diagnosis not present

## 2016-02-24 DIAGNOSIS — H2513 Age-related nuclear cataract, bilateral: Secondary | ICD-10-CM | POA: Diagnosis not present

## 2016-03-01 DIAGNOSIS — R05 Cough: Secondary | ICD-10-CM | POA: Diagnosis not present

## 2016-03-01 DIAGNOSIS — K219 Gastro-esophageal reflux disease without esophagitis: Secondary | ICD-10-CM | POA: Diagnosis not present

## 2016-03-01 DIAGNOSIS — R140 Abdominal distension (gaseous): Secondary | ICD-10-CM | POA: Diagnosis not present

## 2016-03-02 ENCOUNTER — Inpatient Hospital Stay: Admission: RE | Admit: 2016-03-02 | Payer: Medicare Other | Source: Ambulatory Visit

## 2016-03-28 DIAGNOSIS — R05 Cough: Secondary | ICD-10-CM | POA: Diagnosis not present

## 2016-03-28 DIAGNOSIS — J300 Vasomotor rhinitis: Secondary | ICD-10-CM | POA: Diagnosis not present

## 2016-03-28 DIAGNOSIS — K219 Gastro-esophageal reflux disease without esophagitis: Secondary | ICD-10-CM | POA: Diagnosis not present

## 2016-04-11 ENCOUNTER — Encounter (HOSPITAL_COMMUNITY): Payer: Self-pay | Admitting: *Deleted

## 2016-04-13 ENCOUNTER — Other Ambulatory Visit: Payer: Self-pay | Admitting: Gastroenterology

## 2016-04-17 ENCOUNTER — Encounter (HOSPITAL_COMMUNITY): Payer: Self-pay | Admitting: Anesthesiology

## 2016-04-17 NOTE — Anesthesia Preprocedure Evaluation (Addendum)
Anesthesia Evaluation  Patient identified by MRN, date of birth, ID band Patient awake    Reviewed: Allergy & Precautions, NPO status , Patient's Chart, lab work & pertinent test results  Airway Mallampati: II  TM Distance: >3 FB Neck ROM: Full    Dental  (+) Teeth Intact, Dental Advisory Given   Pulmonary former smoker,    breath sounds clear to auscultation       Cardiovascular + CAD and + Peripheral Vascular Disease   Rhythm:Regular Rate:Normal     Neuro/Psych  Neuromuscular disease negative psych ROS   GI/Hepatic Neg liver ROS, GERD  Medicated,  Endo/Other  negative endocrine ROS  Renal/GU negative Renal ROS  negative genitourinary   Musculoskeletal  (+) Arthritis , Osteoarthritis,    Abdominal   Peds negative pediatric ROS (+)  Hematology negative hematology ROS (+)   Anesthesia Other Findings   Reproductive/Obstetrics                            Lab Results  Component Value Date   WBC 6.2 11/09/2015   HGB 12.2 (L) 11/09/2015   HCT 35.7 (L) 11/09/2015   MCV 91.5 11/09/2015   PLT 344 11/09/2015   Lab Results  Component Value Date   CREATININE 0.88 11/09/2015   BUN 12 11/09/2015   NA 137 11/09/2015   K 3.7 11/09/2015   CL 106 11/09/2015   CO2 22 11/09/2015   No results found for: INR, PROTIME  10/2015 EKG: Sinus tachycardia  Anesthesia Physical Anesthesia Plan  ASA: II  Anesthesia Plan: MAC   Post-op Pain Management:    Induction: Intravenous  Airway Management Planned: Natural Airway  Additional Equipment:   Intra-op Plan:   Post-operative Plan:   Informed Consent: I have reviewed the patients History and Physical, chart, labs and discussed the procedure including the risks, benefits and alternatives for the proposed anesthesia with the patient or authorized representative who has indicated his/her understanding and acceptance.   Dental advisory  given  Plan Discussed with: CRNA  Anesthesia Plan Comments:         Anesthesia Quick Evaluation

## 2016-04-18 ENCOUNTER — Ambulatory Visit (HOSPITAL_COMMUNITY): Payer: Medicare Other | Admitting: Anesthesiology

## 2016-04-18 ENCOUNTER — Encounter (HOSPITAL_COMMUNITY)
Admission: RE | Disposition: A | Discharge status: Home / Self Care | Payer: Self-pay | Source: Ambulatory Visit | Attending: Gastroenterology

## 2016-04-18 ENCOUNTER — Encounter (HOSPITAL_COMMUNITY): Payer: Self-pay

## 2016-04-18 ENCOUNTER — Ambulatory Visit (HOSPITAL_COMMUNITY)
Admission: RE | Admit: 2016-04-18 | Discharge: 2016-04-18 | Disposition: A | Discharge status: Home / Self Care | Payer: Medicare Other | Source: Ambulatory Visit | Attending: Gastroenterology | Admitting: Gastroenterology

## 2016-04-18 DIAGNOSIS — Z8249 Family history of ischemic heart disease and other diseases of the circulatory system: Secondary | ICD-10-CM | POA: Insufficient documentation

## 2016-04-18 DIAGNOSIS — K2900 Acute gastritis without bleeding: Secondary | ICD-10-CM | POA: Insufficient documentation

## 2016-04-18 DIAGNOSIS — I2510 Atherosclerotic heart disease of native coronary artery without angina pectoris: Secondary | ICD-10-CM | POA: Diagnosis not present

## 2016-04-18 DIAGNOSIS — R05 Cough: Secondary | ICD-10-CM | POA: Insufficient documentation

## 2016-04-18 DIAGNOSIS — K219 Gastro-esophageal reflux disease without esophagitis: Secondary | ICD-10-CM | POA: Diagnosis not present

## 2016-04-18 DIAGNOSIS — Z8673 Personal history of transient ischemic attack (TIA), and cerebral infarction without residual deficits: Secondary | ICD-10-CM | POA: Insufficient documentation

## 2016-04-18 DIAGNOSIS — Z87891 Personal history of nicotine dependence: Secondary | ICD-10-CM | POA: Diagnosis not present

## 2016-04-18 DIAGNOSIS — J310 Chronic rhinitis: Secondary | ICD-10-CM | POA: Diagnosis not present

## 2016-04-18 DIAGNOSIS — Z79899 Other long term (current) drug therapy: Secondary | ICD-10-CM | POA: Diagnosis not present

## 2016-04-18 DIAGNOSIS — Z7902 Long term (current) use of antithrombotics/antiplatelets: Secondary | ICD-10-CM | POA: Insufficient documentation

## 2016-04-18 HISTORY — PX: BRAVO PH STUDY: SHX5421

## 2016-04-18 HISTORY — PX: ESOPHAGOGASTRODUODENOSCOPY (EGD) WITH PROPOFOL: SHX5813

## 2016-04-18 SURGERY — ESOPHAGOGASTRODUODENOSCOPY (EGD) WITH PROPOFOL
Anesthesia: Monitor Anesthesia Care

## 2016-04-18 MED ORDER — SODIUM CHLORIDE 0.9 % IV SOLN: INTRAVENOUS | Status: DC

## 2016-04-18 MED ORDER — PROPOFOL 10 MG/ML IV BOLUS
INTRAVENOUS | Status: DC | PRN
  Administered 2016-04-18: 40 mg via INTRAVENOUS
  Administered 2016-04-18 (×3): 20 mg via INTRAVENOUS
  Administered 2016-04-18: 40 mg via INTRAVENOUS
  Administered 2016-04-18 (×4): 20 mg via INTRAVENOUS

## 2016-04-18 MED ORDER — PROPOFOL 10 MG/ML IV BOLUS
INTRAVENOUS | Status: AC
  Filled 2016-04-18: qty 40

## 2016-04-18 MED ORDER — LIDOCAINE 2% (20 MG/ML) 5 ML SYRINGE
INTRAMUSCULAR | Status: AC
  Filled 2016-04-18: qty 5

## 2016-04-18 MED ORDER — BUTAMBEN-TETRACAINE-BENZOCAINE 2-2-14 % EX AERO
INHALATION_SPRAY | CUTANEOUS | Status: DC | PRN
  Administered 2016-04-18: 1 via TOPICAL

## 2016-04-18 MED ORDER — LACTATED RINGERS IV SOLN
INTRAVENOUS | Status: DC
  Administered 2016-04-18: 1000 mL via INTRAVENOUS

## 2016-04-18 MED ORDER — OMEPRAZOLE 20 MG PO CPDR: 20.0000 mg | DELAYED_RELEASE_CAPSULE | Freq: Every day | ORAL | Status: DC

## 2016-04-18 SURGICAL SUPPLY — 14 items

## 2016-04-18 NOTE — Discharge Instructions (Signed)

## 2016-04-18 NOTE — Anesthesia Postprocedure Evaluation (Signed)
Anesthesia Post Note  Patient: MAJED COLGROVE  Procedure(s) Performed: Procedure(s) (LRB): ESOPHAGOGASTRODUODENOSCOPY (EGD) WITH PROPOFOL (N/A) BRAVO PH STUDY (N/A)  Patient location during evaluation: PACU Anesthesia Type: MAC Level of consciousness: awake and alert Pain management: pain level controlled Vital Signs Assessment: post-procedure vital signs reviewed and stable Respiratory status: spontaneous breathing, nonlabored ventilation, respiratory function stable and patient connected to nasal cannula oxygen Cardiovascular status: stable and blood pressure returned to baseline Anesthetic complications: no    Last Vitals:  Vitals:   04/18/16 0801 04/18/16 0940  BP: (!) 127/55 (!) 102/46  Pulse: 72 63  Resp: 18 15  Temp: 36.6 C 36.4 C    Last Pain:  Vitals:   04/18/16 0940  TempSrc: Oral                 Effie Berkshire

## 2016-04-18 NOTE — Op Note (Signed)
[facility] Patient Name: Stuart Watson Procedure Date: 04/18/2016 MRN: [id number] Attending MD: Lear Ng , MD Date of Birth: November 12, 1931 CSN: [id number] Age: 82 Admit Type: Outpatient Procedure:                Upper GI endoscopy Indications:              Esophageal reflux, Chronic cough Providers:                Lear Ng, MD, Cleda Daub, RN, Cherylynn Ridges, Technician, Dion Saucier, CRNA Referring MD:              Medicines:                Propofol per Anesthesia, Monitored Anesthesia Care Complications:            No immediate complications. Estimated Blood Loss:     Estimated blood loss: none. Procedure:                Pre-Anesthesia Assessment:                           - Prior to the procedure, a History and Physical                            was performed, and patient medications and                            allergies were reviewed. The patient's tolerance of                            previous anesthesia was also reviewed. The risks                            and benefits of the procedure and the sedation                            options and risks were discussed with the patient.                            All questions were answered, and informed consent                            was obtained. Prior Anticoagulants: The patient has                            taken Plavix (clopidogrel), last dose was 5 days                            prior to procedure. ASA Grade Assessment: II - A                            patient with mild systemic disease. After reviewing  the risks and benefits, the patient was deemed in                            satisfactory condition to undergo the procedure.                           After obtaining informed consent, the endoscope was                            passed under direct vision. Throughout the                            procedure, the patient's blood  pressure, pulse, and                            oxygen saturations were monitored continuously. The                            [id number]) scope was introduced through the                            mouth, and advanced to the second part of duodenum.                            The upper GI endoscopy was accomplished without                            difficulty. The patient tolerated the procedure                            well. Scope In: Scope Out: Findings:      The examined esophagus was normal.      The Z-line was regular and was found 38 cm from the incisors.      Segmental mild inflammation characterized by congestion (edema) and       linear erosions was found in the gastric antrum.      The exam of the stomach was otherwise normal.      The examined duodenum was normal. Impression:               - Normal esophagus.                           - Z-line regular, 38 cm from the incisors.                           - Acute gastritis.                           - Normal examined duodenum.                           - Bravo capsule deployed 6 cm above the Z-line in                            the usual fashion  and successful deployment noted.                           - No specimens collected. Moderate Sedation:      N/A- Per Anesthesia Care Recommendation:           - Follow up on Bravo capsule results and see if any                            correlation of coughing spells to acid reflux. PPIs                            stopped 2 weeks prior to the Bravo and will remain                            off of the PPIs during the study period.                           - Resume Plavix (clopidogrel) at prior dose                            tomorrow.                           - Follow an antireflux regimen. Procedure Code(s):        --- Professional ---                           [phone], Esophagogastroduodenoscopy, flexible,                            transoral; diagnostic, including  collection of                            specimen(s) by brushing or washing, when performed                            (separate procedure) Diagnosis Code(s):        --- Professional ---                           K21.9, Gastro-esophageal reflux disease without                            esophagitis                           K29.00, Acute gastritis without bleeding                           R05, Cough CPT copyright 2016 American Medical Association. All rights reserved. The codes documented in this report are preliminary and upon coder review may  be revised to meet current compliance requirements. Lear Ng, MD 04/18/2016 9:39:00 AM This report has been signed electronically. Number of Addenda: 0

## 2016-04-18 NOTE — H&P (Signed)
Date of Initial H&P: 04/12/16  History reviewed, patient examined, no change in status, stable for surgery.

## 2016-04-18 NOTE — Transfer of Care (Signed)
Immediate Anesthesia Transfer of Care Note  Patient: FERRIS VALEN  Procedure(s) Performed: Procedure(s): ESOPHAGOGASTRODUODENOSCOPY (EGD) WITH PROPOFOL (N/A) BRAVO PH STUDY (N/A)  Patient Location: PACU and Endoscopy Unit  Anesthesia Type:MAC  Level of Consciousness: awake, oriented, patient cooperative, lethargic and responds to stimulation  Airway & Oxygen Therapy: Patient Spontanous Breathing and Patient connected to nasal cannula oxygen  Post-op Assessment: Report given to RN, Post -op Vital signs reviewed and stable and Patient moving all extremities  Post vital signs: Reviewed and stable  Last Vitals:  Vitals:   04/18/16 0801  BP: (!) 127/55  Pulse: 72  Resp: 18  Temp: 36.6 C    Last Pain:  Vitals:   04/18/16 0801  TempSrc: Oral         Complications: No apparent anesthesia complications

## 2016-04-18 NOTE — Interval H&P Note (Signed)
History and Physical Interval Note:  04/18/2016 8:59 AM  Stuart Watson  has presented today for surgery, with the diagnosis of reflux  The various methods of treatment have been discussed with the patient and family. After consideration of risks, benefits and other options for treatment, the patient has consented to  Procedure(s): ESOPHAGOGASTRODUODENOSCOPY (EGD) WITH PROPOFOL (N/A) BRAVO PH STUDY (N/A) as a surgical intervention .  The patient's history has been reviewed, patient examined, no change in status, stable for surgery.  I have reviewed the patient's chart and labs.  Questions were answered to the patient's satisfaction.     Ruffin C.

## 2016-04-19 ENCOUNTER — Encounter (HOSPITAL_COMMUNITY): Payer: Self-pay | Admitting: Gastroenterology

## 2016-04-20 DIAGNOSIS — L570 Actinic keratosis: Secondary | ICD-10-CM | POA: Diagnosis not present

## 2016-04-20 DIAGNOSIS — L819 Disorder of pigmentation, unspecified: Secondary | ICD-10-CM | POA: Diagnosis not present

## 2016-04-20 DIAGNOSIS — D1801 Hemangioma of skin and subcutaneous tissue: Secondary | ICD-10-CM | POA: Diagnosis not present

## 2016-04-20 DIAGNOSIS — L821 Other seborrheic keratosis: Secondary | ICD-10-CM | POA: Diagnosis not present

## 2016-04-20 DIAGNOSIS — B351 Tinea unguium: Secondary | ICD-10-CM | POA: Diagnosis not present

## 2016-05-28 DIAGNOSIS — Z23 Encounter for immunization: Secondary | ICD-10-CM | POA: Diagnosis not present

## 2016-06-27 ENCOUNTER — Ambulatory Visit (INDEPENDENT_AMBULATORY_CARE_PROVIDER_SITE_OTHER)
Admission: RE | Admit: 2016-06-27 | Discharge: 2016-06-27 | Disposition: A | Discharge status: Home / Self Care | Payer: Medicare Other | Source: Ambulatory Visit | Attending: Adult Health | Admitting: Adult Health

## 2016-06-27 DIAGNOSIS — R911 Solitary pulmonary nodule: Secondary | ICD-10-CM | POA: Diagnosis not present

## 2016-06-27 DIAGNOSIS — R918 Other nonspecific abnormal finding of lung field: Secondary | ICD-10-CM | POA: Diagnosis not present

## 2016-06-29 ENCOUNTER — Ambulatory Visit (INDEPENDENT_AMBULATORY_CARE_PROVIDER_SITE_OTHER): Payer: Medicare Other | Admitting: Pulmonary Disease

## 2016-06-29 ENCOUNTER — Encounter: Payer: Self-pay | Admitting: Pulmonary Disease

## 2016-06-29 VITALS — BP 114/72 | HR 68 | Ht 67.0 in | Wt 156.8 lb

## 2016-06-29 DIAGNOSIS — J690 Pneumonitis due to inhalation of food and vomit: Secondary | ICD-10-CM

## 2016-06-29 DIAGNOSIS — J300 Vasomotor rhinitis: Secondary | ICD-10-CM | POA: Diagnosis not present

## 2016-06-29 DIAGNOSIS — R918 Other nonspecific abnormal finding of lung field: Secondary | ICD-10-CM | POA: Diagnosis not present

## 2016-06-29 NOTE — Patient Instructions (Signed)
Follow up in 1 year.

## 2016-06-29 NOTE — Progress Notes (Signed)
  Current Outpatient Prescriptions on File Prior to Visit  Medication Sig  . acetaminophen (TYLENOL) 325 MG tablet Take 650 mg by mouth every 6 (six) hours as needed for mild pain.   . cetirizine (ZYRTEC) 10 MG tablet Take 10 mg by mouth daily. Reported on 12/30/2015  . clopidogrel (PLAVIX) 75 MG tablet Take 75 mg by mouth daily with breakfast.  . doxazosin (CARDURA) 8 MG tablet Take 8 mg by mouth daily.  . Fish Oil-Cholecalciferol (FISH OIL + D3 PO) Take by mouth.  . fluconazole (DIFLUCAN) 200 MG tablet Take 200 mg by mouth every 7 (seven) days. Every 7 days. wednesday  . fluticasone (VERAMYST) 27.5 MCG/SPRAY nasal spray Place 2 sprays into the nose daily.  . furosemide (LASIX) 20 MG tablet Take 20 mg by mouth daily.   . Multiple Vitamin (MULTIVITAMIN) tablet Take 1 tablet by mouth daily.  Marland Kitchen omeprazole (PRILOSEC) 20 MG capsule Take 1 capsule (20 mg total) by mouth daily.  . simvastatin (ZOCOR) 20 MG tablet Take 20 mg by mouth daily.   No current facility-administered medications on file prior to visit.     Chief Complaint  Patient presents with  . Follow-up    for CT scan results. Breathing has been ok since last visit.     Cardiac tests Echo 07/27/13 >> EF 60 to 65%  Pulmonary tests PFT 04/21/14 >> FEV1 2.58 (98%), FEV1% 73, TLC 6.45 (96%), DLCO 74%, no BD CT chest 12/27/15 >> patchy consolidation LLL, GGO 1.8 cm RUL, patchy GGO RML and RLL CT chest 06/27/16 >> GGO's resolved, no change in nodules  Past medical history TIA, BPH, Optic neuritis, PMR, Temporal arteritis, RLS, Shingles, Hiatal hernia  Past surgical history, Family history, Social history, Allergies reviewed  Vital signs BP 114/72 (BP Location: Left Arm, Patient Position: Sitting, Cuff Size: Normal)   Pulse 68   Ht 5\' 7"  (1.702 m)   Wt 156 lb 12.8 oz (71.1 kg)   SpO2 98%   BMI 24.56 kg/m   History of present illness Stuart Watson is a 82 y.o. male former smoker with chronic cough and lung nodules.  He is  here to review his CT chest.  GGO's resolved, and other nodules stable.  He is not having chest congestion, wheeze, or fever.  He continues to have post nasal drip, and this triggers a cough.  He has been seen by ENT and allergy.  He was seen by GI and had pH monitoring.  Was told he didn't have significant reflux.  He has not noticed trouble with his swallowing.  Physical exam  General - No distress ENT - No sinus tenderness, no oral exudate, narrow nasal angle, pale nasal mucosa Cardiac - s1s2 regular, no murmur Chest - no wheeze/rales Back - No focal tenderness Abd - Soft, non-tender Ext - No edema Neuro - Normal strength Skin - No rashes Psych - Normal mood, and behavior  Assessment/plan  Recurrent pneumonia. - no recurrence since Summer of 2017  - monitor clinically  Pulmonary nodules. - stable since 2014 - no additional radiographic follow up needed  Chronic rhinitis. - continue nasal irrigation, veramyst, prn guaifenesin   Patient Instructions  Follow up in 1 year    Chesley Mires, MD [place] Pulmonary/Critical Care/Sleep Pager:  [phone] 06/29/2016, 10:27 AM

## 2016-07-03 DIAGNOSIS — R600 Localized edema: Secondary | ICD-10-CM | POA: Diagnosis not present

## 2016-07-03 DIAGNOSIS — R7309 Other abnormal glucose: Secondary | ICD-10-CM | POA: Diagnosis not present

## 2016-07-03 DIAGNOSIS — R911 Solitary pulmonary nodule: Secondary | ICD-10-CM | POA: Diagnosis not present

## 2016-07-03 DIAGNOSIS — E7800 Pure hypercholesterolemia, unspecified: Secondary | ICD-10-CM | POA: Diagnosis not present

## 2016-07-03 DIAGNOSIS — G2581 Restless legs syndrome: Secondary | ICD-10-CM | POA: Diagnosis not present

## 2016-07-03 DIAGNOSIS — K219 Gastro-esophageal reflux disease without esophagitis: Secondary | ICD-10-CM | POA: Diagnosis not present

## 2016-07-03 DIAGNOSIS — Z0000 Encounter for general adult medical examination without abnormal findings: Secondary | ICD-10-CM | POA: Diagnosis not present

## 2016-07-03 DIAGNOSIS — N400 Enlarged prostate without lower urinary tract symptoms: Secondary | ICD-10-CM | POA: Diagnosis not present

## 2016-07-03 DIAGNOSIS — G459 Transient cerebral ischemic attack, unspecified: Secondary | ICD-10-CM | POA: Diagnosis not present

## 2016-07-03 DIAGNOSIS — I700 Atherosclerosis of aorta: Secondary | ICD-10-CM | POA: Diagnosis not present

## 2016-07-03 DIAGNOSIS — Z1389 Encounter for screening for other disorder: Secondary | ICD-10-CM | POA: Diagnosis not present

## 2016-07-03 DIAGNOSIS — M353 Polymyalgia rheumatica: Secondary | ICD-10-CM | POA: Diagnosis not present

## 2016-12-24 DIAGNOSIS — H469 Unspecified optic neuritis: Secondary | ICD-10-CM | POA: Diagnosis not present

## 2016-12-24 DIAGNOSIS — I700 Atherosclerosis of aorta: Secondary | ICD-10-CM | POA: Diagnosis not present

## 2016-12-24 DIAGNOSIS — K219 Gastro-esophageal reflux disease without esophagitis: Secondary | ICD-10-CM | POA: Diagnosis not present

## 2016-12-24 DIAGNOSIS — R600 Localized edema: Secondary | ICD-10-CM | POA: Diagnosis not present

## 2016-12-24 DIAGNOSIS — G2581 Restless legs syndrome: Secondary | ICD-10-CM | POA: Diagnosis not present

## 2016-12-24 DIAGNOSIS — J309 Allergic rhinitis, unspecified: Secondary | ICD-10-CM | POA: Diagnosis not present

## 2016-12-24 DIAGNOSIS — G459 Transient cerebral ischemic attack, unspecified: Secondary | ICD-10-CM | POA: Diagnosis not present

## 2016-12-24 DIAGNOSIS — Z1211 Encounter for screening for malignant neoplasm of colon: Secondary | ICD-10-CM | POA: Diagnosis not present

## 2016-12-24 DIAGNOSIS — R972 Elevated prostate specific antigen [PSA]: Secondary | ICD-10-CM | POA: Diagnosis not present

## 2016-12-24 DIAGNOSIS — E7800 Pure hypercholesterolemia, unspecified: Secondary | ICD-10-CM | POA: Diagnosis not present

## 2016-12-24 DIAGNOSIS — N400 Enlarged prostate without lower urinary tract symptoms: Secondary | ICD-10-CM | POA: Diagnosis not present

## 2017-01-15 DIAGNOSIS — Z1212 Encounter for screening for malignant neoplasm of rectum: Secondary | ICD-10-CM | POA: Diagnosis not present

## 2017-01-15 DIAGNOSIS — Z1211 Encounter for screening for malignant neoplasm of colon: Secondary | ICD-10-CM | POA: Diagnosis not present

## 2017-03-08 DIAGNOSIS — H5202 Hypermetropia, left eye: Secondary | ICD-10-CM | POA: Diagnosis not present

## 2017-03-08 DIAGNOSIS — H47012 Ischemic optic neuropathy, left eye: Secondary | ICD-10-CM | POA: Diagnosis not present

## 2017-03-08 DIAGNOSIS — H2513 Age-related nuclear cataract, bilateral: Secondary | ICD-10-CM | POA: Diagnosis not present

## 2017-04-15 DIAGNOSIS — Z23 Encounter for immunization: Secondary | ICD-10-CM | POA: Diagnosis not present

## 2017-04-22 DIAGNOSIS — L570 Actinic keratosis: Secondary | ICD-10-CM | POA: Diagnosis not present

## 2017-04-22 DIAGNOSIS — D485 Neoplasm of uncertain behavior of skin: Secondary | ICD-10-CM | POA: Diagnosis not present

## 2017-04-22 DIAGNOSIS — D1801 Hemangioma of skin and subcutaneous tissue: Secondary | ICD-10-CM | POA: Diagnosis not present

## 2017-04-22 DIAGNOSIS — C44319 Basal cell carcinoma of skin of other parts of face: Secondary | ICD-10-CM | POA: Diagnosis not present

## 2017-04-22 DIAGNOSIS — L821 Other seborrheic keratosis: Secondary | ICD-10-CM | POA: Diagnosis not present

## 2017-04-22 DIAGNOSIS — C4441 Basal cell carcinoma of skin of scalp and neck: Secondary | ICD-10-CM | POA: Diagnosis not present

## 2017-04-22 DIAGNOSIS — L603 Nail dystrophy: Secondary | ICD-10-CM | POA: Diagnosis not present

## 2017-07-05 DIAGNOSIS — Z0000 Encounter for general adult medical examination without abnormal findings: Secondary | ICD-10-CM | POA: Diagnosis not present

## 2017-07-05 DIAGNOSIS — R972 Elevated prostate specific antigen [PSA]: Secondary | ICD-10-CM | POA: Diagnosis not present

## 2017-07-05 DIAGNOSIS — R7309 Other abnormal glucose: Secondary | ICD-10-CM | POA: Diagnosis not present

## 2017-07-05 DIAGNOSIS — J309 Allergic rhinitis, unspecified: Secondary | ICD-10-CM | POA: Diagnosis not present

## 2017-07-05 DIAGNOSIS — I700 Atherosclerosis of aorta: Secondary | ICD-10-CM | POA: Diagnosis not present

## 2017-07-05 DIAGNOSIS — M353 Polymyalgia rheumatica: Secondary | ICD-10-CM | POA: Diagnosis not present

## 2017-07-05 DIAGNOSIS — Z1389 Encounter for screening for other disorder: Secondary | ICD-10-CM | POA: Diagnosis not present

## 2017-07-05 DIAGNOSIS — E7800 Pure hypercholesterolemia, unspecified: Secondary | ICD-10-CM | POA: Diagnosis not present

## 2017-07-05 DIAGNOSIS — R600 Localized edema: Secondary | ICD-10-CM | POA: Diagnosis not present

## 2017-07-05 DIAGNOSIS — N400 Enlarged prostate without lower urinary tract symptoms: Secondary | ICD-10-CM | POA: Diagnosis not present

## 2017-07-05 DIAGNOSIS — G459 Transient cerebral ischemic attack, unspecified: Secondary | ICD-10-CM | POA: Diagnosis not present

## 2017-07-05 DIAGNOSIS — K219 Gastro-esophageal reflux disease without esophagitis: Secondary | ICD-10-CM | POA: Diagnosis not present

## 2017-08-21 DIAGNOSIS — M1990 Unspecified osteoarthritis, unspecified site: Secondary | ICD-10-CM | POA: Diagnosis not present

## 2017-08-21 DIAGNOSIS — N400 Enlarged prostate without lower urinary tract symptoms: Secondary | ICD-10-CM | POA: Diagnosis not present

## 2017-08-21 DIAGNOSIS — G459 Transient cerebral ischemic attack, unspecified: Secondary | ICD-10-CM | POA: Diagnosis not present

## 2017-09-03 DIAGNOSIS — M1990 Unspecified osteoarthritis, unspecified site: Secondary | ICD-10-CM | POA: Diagnosis not present

## 2017-09-03 DIAGNOSIS — N400 Enlarged prostate without lower urinary tract symptoms: Secondary | ICD-10-CM | POA: Diagnosis not present

## 2017-09-03 DIAGNOSIS — G459 Transient cerebral ischemic attack, unspecified: Secondary | ICD-10-CM | POA: Diagnosis not present

## 2017-10-24 DIAGNOSIS — L603 Nail dystrophy: Secondary | ICD-10-CM | POA: Diagnosis not present

## 2017-10-24 DIAGNOSIS — L570 Actinic keratosis: Secondary | ICD-10-CM | POA: Diagnosis not present

## 2017-10-24 DIAGNOSIS — Z85828 Personal history of other malignant neoplasm of skin: Secondary | ICD-10-CM | POA: Diagnosis not present

## 2017-11-13 ENCOUNTER — Ambulatory Visit (INDEPENDENT_AMBULATORY_CARE_PROVIDER_SITE_OTHER)
Admission: RE | Admit: 2017-11-13 | Discharge: 2017-11-13 | Disposition: A | Discharge status: Home / Self Care | Payer: Medicare Other | Source: Ambulatory Visit | Attending: Adult Health | Admitting: Adult Health

## 2017-11-13 ENCOUNTER — Other Ambulatory Visit (INDEPENDENT_AMBULATORY_CARE_PROVIDER_SITE_OTHER): Payer: Medicare Other

## 2017-11-13 ENCOUNTER — Encounter: Payer: Self-pay | Admitting: Adult Health

## 2017-11-13 ENCOUNTER — Ambulatory Visit (INDEPENDENT_AMBULATORY_CARE_PROVIDER_SITE_OTHER): Payer: Medicare Other | Admitting: Adult Health

## 2017-11-13 VITALS — BP 110/70 | HR 76 | Ht 66.5 in | Wt 156.0 lb

## 2017-11-13 DIAGNOSIS — R0602 Shortness of breath: Secondary | ICD-10-CM

## 2017-11-13 DIAGNOSIS — R0609 Other forms of dyspnea: Secondary | ICD-10-CM | POA: Diagnosis not present

## 2017-11-13 DIAGNOSIS — J90 Pleural effusion, not elsewhere classified: Secondary | ICD-10-CM | POA: Diagnosis not present

## 2017-11-13 DIAGNOSIS — R05 Cough: Secondary | ICD-10-CM

## 2017-11-13 DIAGNOSIS — R053 Chronic cough: Secondary | ICD-10-CM

## 2017-11-13 LAB — BASIC METABOLIC PANEL
BUN: 16 mg/dL (ref 6–23)
CALCIUM: 8.6 mg/dL (ref 8.4–10.5)
CO2: 28 mEq/L (ref 19–32)
CREATININE: 0.8 mg/dL (ref 0.40–1.50)
Chloride: 97 mEq/L (ref 96–112)
GFR: 97.95 mL/min (ref >60.00)
Glucose, Bld: 89 mg/dL (ref 70–99)
Potassium: 4.6 mEq/L (ref 3.5–5.1)
SODIUM: 128 meq/L — AB (ref 135–145)

## 2017-11-13 LAB — CBC WITH DIFFERENTIAL/PLATELET
BASOS PCT: 0.8 % (ref 0.0–3.0)
Basophils Absolute: 0 10*3/uL (ref 0.0–0.1)
EOS PCT: 2.2 % (ref 0.0–5.0)
Eosinophils Absolute: 0.1 10*3/uL (ref 0.0–0.7)
HCT: 40.8 % (ref 39.0–52.0)
Hemoglobin: 14 g/dL (ref 13.0–17.0)
LYMPHS ABS: 1.5 10*3/uL (ref 0.7–4.0)
Lymphocytes Relative: 29.1 % (ref 12.0–46.0)
MCHC: 34.4 g/dL (ref 30.0–36.0)
MCV: 96.1 fl (ref 78.0–100.0)
MONO ABS: 0.6 10*3/uL (ref 0.1–1.0)
MONOS PCT: 11.6 % (ref 3.0–12.0)
NEUTROS PCT: 56.3 % (ref 43.0–77.0)
Neutro Abs: 2.9 10*3/uL (ref 1.4–7.7)
Platelets: 351 10*3/uL (ref 150.0–400.0)
RBC: 4.25 Mil/uL (ref 4.22–5.81)
RDW: 13.8 % (ref 11.5–15.5)
WBC: 5.1 10*3/uL (ref 4.0–10.5)

## 2017-11-13 LAB — BRAIN NATRIURETIC PEPTIDE: Pro B Natriuretic peptide (BNP): 31 pg/mL (ref 0.0–100.0)

## 2017-11-13 NOTE — Addendum Note (Signed)
Addended by: Parke Poisson E on: 11/13/2017 12:56 PM   Modules accepted: Orders

## 2017-11-13 NOTE — Patient Instructions (Addendum)
Chest xray today  Labs today .  Start Zyrtec 10mg  1/2 At bedtime   Start Veramyst 2 puffs daily .  Begin Pepcid 20mg  At bedtime  .  Follow up in 6 weeks with Dr. [name]  With PFT and As needed   Please contact office for sooner follow up if symptoms do not improve or worsen or seek emergency care

## 2017-11-13 NOTE — Progress Notes (Signed)
@Patient  ID: Stuart Watson, male    DOB: 1931-09-14, 84 y.o.   MRN: [id number]  Chief Complaint  Patient presents with  . Follow-up    Dyspnea and cough     Referring provider: Wenda Low, MD  HPI: 84 yo male former smoker with chronic cough and lung nodules (stable 2014 thru 2017 ) . And recurrent PNA  PMH : Temporal Arteritis /optic neuritis    TEST  Echo 07/27/13 >> EF 60 to 65% PFT 04/21/14 >> FEV1 2.58 (98%), FEV1% 73, TLC 6.45 (96%), DLCO 74%, no BD CT chest 12/27/15 >> patchy consolidation LLL, GGO 1.8 cm RUL, patchy GGO RML and RLL CT chest 06/27/16 >> GGO's resolved, no change in nodules  11/13/2017 Follow up ; Cough /Dyspnea  Pt presents for a follow up . Last seen in 06/2016 . He was previously seen for aspiration pneumonitis , GERD and Lung nodules . Treated for GERD . Serial CT follow up , with GGO resolved and no change in nodule from 2014 to 2017 on CT chest 06/2016.  Says doing well until last 6 months .  Feels that his activity tolerance has decreased and gets winded more easily.  He does feel more tired and fatigued.  He exercises 4 days a week.  And says that he has to really push hisself  to complete his hour to hour and a half workout. He does have a daily dry cough that is chronic for ye Has chronic ankle edema has not noticed any changes.  Takes Lasix 20 mg daily. Since last visit says he has not been diagnosed with any pneumonia and has not been on antibiotics. Does have chronic rhinitis .  Has not been taking Zyrtec or Veramyst on a regular basis  Previously treated for GERD with previous endoscopy by GI.  He was on Prilosec however says he was weaned off of this 2 months ago.  Does notice that nighttime cough has been worse. He denies any chest pain, orthopnea, hemoptysis, fever.   No Known Allergies  Immunization History  Administered Date(s) Administered  . Influenza, High Dose Seasonal PF 04/22/2017  . Influenza,inj,Quad PF,6+ Mos 04/23/2015  .  Pneumococcal Conjugate-13 07/23/2016  . Pneumococcal Polysaccharide-23 11/13/2012    Past Medical History:  Diagnosis Date  . Actinic keratoses   . Arthritis   . Colon polyps   . Dyspnea 07/13/2013   BNP normal  . Edema 07/13/2013  . GERD (gastroesophageal reflux disease)    omeprazole 1-2 times daily  . H/O TIA (transient ischemic attack) and stroke    on statins and plavix -saw Dr.Sethi in the past  . History of BPH   . History of TIA (transient ischemic attack) 07/13/2013  . Lumbar radiculopathy   . Need for shingles vaccine   . Optic neuritis 10/2015   left eye  . Perennial allergic rhinitis   . Polymyalgia rheumatica ([place]) 07/13/2013   Daily prednisone  . Restless leg syndrome   . Retinal disease    right eye since birth   . Shingles   . Temporal arteritis ([place]) 10/2015    Tobacco History: Social History   Tobacco Use  Smoking Status Former Smoker  . Packs/day: 1.00  . Years: 15.00  . Pack years: 15.00  . Types: Cigarettes  . Last attempt to quit: 07/23/1975  . Years since quitting: 42.3  Smokeless Tobacco Never Used   Counseling given: Not Answered   Outpatient Encounter Medications as of 11/13/2017  Medication Sig  .  acetaminophen (TYLENOL) 325 MG tablet Take 650 mg by mouth every 6 (six) hours as needed for mild pain.   . cetirizine (ZYRTEC) 10 MG tablet Take 10 mg by mouth daily as needed. Reported on 12/30/2015  . clopidogrel (PLAVIX) 75 MG tablet Take 75 mg by mouth daily with breakfast.  . doxazosin (CARDURA) 8 MG tablet Take 8 mg by mouth daily.  . fluticasone (VERAMYST) 27.5 MCG/SPRAY nasal spray Place 2 sprays into the nose daily as needed.   . furosemide (LASIX) 20 MG tablet Take 20 mg by mouth daily.   . simvastatin (ZOCOR) 20 MG tablet Take 20 mg by mouth daily.  . Fish Oil-Cholecalciferol (FISH OIL + D3 PO) Take by mouth.  . fluconazole (DIFLUCAN) 200 MG tablet Take 200 mg by mouth every 7 (seven) days. Every 7 days. wednesday  . Multiple  Vitamin (MULTIVITAMIN) tablet Take 1 tablet by mouth daily.  Marland Kitchen omeprazole (PRILOSEC) 20 MG capsule Take 1 capsule (20 mg total) by mouth daily. (Patient not taking: Reported on 11/13/2017)   No facility-administered encounter medications on file as of 11/13/2017.      Review of Systems  Constitutional:   No  weight loss, night sweats,  Fevers, chills,  +fatigue, or  lassitude.  HEENT:   No headaches,  Difficulty swallowing,  Tooth/dental problems, or  Sore throat,                No sneezing, itching, ear ache, nasal congestion, post nasal drip,   CV:  No chest pain,  Orthopnea, PND, swelling in lower extremities, anasarca, dizziness, palpitations, syncope.   GI  No heartburn, indigestion, abdominal pain, nausea, vomiting, diarrhea, change in bowel habits, loss of appetite, bloody stools.   Resp:  ,  No coughing up of blood.  No change in color of mucus.  No wheezing.  No chest wall deformity  Skin: no rash or lesions.  GU: no dysuria, change in color of urine, no urgency or frequency.  No flank pain, no hematuria   MS:  No joint pain or swelling.  No decreased range of motion.  No back pain.    Physical Exam  BP 110/70 (BP Location: Left Arm, Cuff Size: Normal)   Pulse 76   Ht 5' 6.5" (1.689 m)   Wt 156 lb (70.8 kg)   SpO2 96%   BMI 24.80 kg/m   GEN: A/Ox3; pleasant , NAD    HEENT:  [place]/AT,  EACs-clear, TMs-wnl, NOSE-clear, THROAT-clear, no lesions, no postnasal drip or exudate noted.   NECK:  Supple w/ fair ROM; no JVD; normal carotid impulses w/o bruits; no thyromegaly or nodules palpated; no lymphadenopathy.    RESP  Clear  P & A; w/o, wheezes/ rales/ or rhonchi. no accessory muscle use, no dullness to percussion  CARD:  RRR, no m/r/g, 1+ peripheral edema, pulses intact, no cyanosis or clubbing.  GI:   Soft & nt; nml bowel sounds; no organomegaly or masses detected.   Musco: Warm bil, no deformities or joint swelling noted.   Neuro: alert, no focal deficits noted.     Skin: Warm, no lesions or rashes    Lab Results:  CBC  ProBNP No results found for: PROBNP  Imaging: No results found.   Assessment & Plan:   Dyspnea Increased cough and dyspnea over last 6 months ? Etiology - possible  GERD/AR contributing to cough  Hx of Aspiration Puemonitis in past.  Will check labs and cxr  Add tirgger prevetnion  Plan  Patient Instructions  Chest xray today  Labs today .  Start Zyrtec 10mg  1/2 At bedtime   Start Veramyst 2 puffs daily .  Begin Pepcid 20mg  At bedtime  .  Follow up in 6 weeks with Dr. [name]  With PFT and As needed   Please contact office for sooner follow up if symptoms do not improve or worsen or seek emergency care         Chronic cough ? GERD /AR  Add Pepcid and Zyrtec/Flonase   Plan  Patient Instructions  Chest xray today  Labs today .  Start Zyrtec 10mg  1/2 At bedtime   Start Veramyst 2 puffs daily .  Begin Pepcid 20mg  At bedtime  .  Follow up in 6 weeks with Dr. [name]  With PFT and As needed   Please contact office for sooner follow up if symptoms do not improve or worsen or seek emergency care      '     Joyclyn Plazola, NP 11/13/2017

## 2017-11-13 NOTE — Assessment & Plan Note (Signed)
?   GERD /AR  Add Pepcid and Zyrtec/Flonase   Plan  Patient Instructions  Chest xray today  Labs today .  Start Zyrtec 10mg  1/2 At bedtime   Start Veramyst 2 puffs daily .  Begin Pepcid 20mg  At bedtime  .  Follow up in 6 weeks with Dr. [name]  With PFT and As needed   Please contact office for sooner follow up if symptoms do not improve or worsen or seek emergency care      '

## 2017-11-13 NOTE — Assessment & Plan Note (Signed)
Increased cough and dyspnea over last 6 months ? Etiology - possible  GERD/AR contributing to cough  Hx of Aspiration Puemonitis in past.  Will check labs and cxr  Add tirgger prevetnion   Plan  Patient Instructions  Chest xray today  Labs today .  Start Zyrtec 10mg  1/2 At bedtime   Start Veramyst 2 puffs daily .  Begin Pepcid 20mg  At bedtime  .  Follow up in 6 weeks with Dr. [name]  With PFT and As needed   Please contact office for sooner follow up if symptoms do not improve or worsen or seek emergency care

## 2017-11-15 ENCOUNTER — Other Ambulatory Visit: Payer: Self-pay | Admitting: Adult Health

## 2017-11-15 DIAGNOSIS — R0602 Shortness of breath: Secondary | ICD-10-CM

## 2017-11-15 DIAGNOSIS — J690 Pneumonitis due to inhalation of food and vomit: Secondary | ICD-10-CM

## 2017-11-15 MED ORDER — AZITHROMYCIN 250 MG PO TABS: ORAL_TABLET | ORAL | 0 refills | Status: DC

## 2017-11-15 NOTE — Progress Notes (Signed)
Zpak, cxr and bmet ordered

## 2017-11-15 NOTE — Progress Notes (Signed)
Reviewed and agree with assessment/plan.   Bennie Scaff, MD [facility] 07/18/2016, 12:24 PM Pager:  [phone]  

## 2017-11-22 ENCOUNTER — Ambulatory Visit (INDEPENDENT_AMBULATORY_CARE_PROVIDER_SITE_OTHER): Payer: Medicare Other | Admitting: Adult Health

## 2017-11-22 ENCOUNTER — Ambulatory Visit (INDEPENDENT_AMBULATORY_CARE_PROVIDER_SITE_OTHER)
Admission: RE | Admit: 2017-11-22 | Discharge: 2017-11-22 | Disposition: A | Discharge status: Home / Self Care | Payer: Medicare Other | Source: Ambulatory Visit | Attending: Adult Health | Admitting: Adult Health

## 2017-11-22 ENCOUNTER — Encounter: Payer: Self-pay | Admitting: Adult Health

## 2017-11-22 ENCOUNTER — Other Ambulatory Visit (INDEPENDENT_AMBULATORY_CARE_PROVIDER_SITE_OTHER): Payer: Medicare Other

## 2017-11-22 DIAGNOSIS — R0602 Shortness of breath: Secondary | ICD-10-CM | POA: Diagnosis not present

## 2017-11-22 DIAGNOSIS — J690 Pneumonitis due to inhalation of food and vomit: Secondary | ICD-10-CM

## 2017-11-22 DIAGNOSIS — K219 Gastro-esophageal reflux disease without esophagitis: Secondary | ICD-10-CM

## 2017-11-22 DIAGNOSIS — E871 Hypo-osmolality and hyponatremia: Secondary | ICD-10-CM | POA: Insufficient documentation

## 2017-11-22 DIAGNOSIS — J181 Lobar pneumonia, unspecified organism: Secondary | ICD-10-CM | POA: Diagnosis not present

## 2017-11-22 DIAGNOSIS — J189 Pneumonia, unspecified organism: Secondary | ICD-10-CM

## 2017-11-22 DIAGNOSIS — R609 Edema, unspecified: Secondary | ICD-10-CM | POA: Diagnosis not present

## 2017-11-22 DIAGNOSIS — R05 Cough: Secondary | ICD-10-CM | POA: Diagnosis not present

## 2017-11-22 LAB — BASIC METABOLIC PANEL
BUN: 15 mg/dL (ref 6–23)
CO2: 26 mEq/L (ref 19–32)
CREATININE: 0.83 mg/dL (ref 0.40–1.50)
Calcium: 8.2 mg/dL — ABNORMAL LOW (ref 8.4–10.5)
Chloride: 98 mEq/L (ref 96–112)
GFR: 93.87 mL/min (ref >60.00)
Glucose, Bld: 115 mg/dL — ABNORMAL HIGH (ref 70–99)
Potassium: 3.9 mEq/L (ref 3.5–5.1)
Sodium: 132 mEq/L — ABNORMAL LOW (ref 135–145)

## 2017-11-22 MED ORDER — LEVOFLOXACIN 500 MG PO TABS: 500.0000 mg | ORAL_TABLET | Freq: Every day | ORAL | 0 refills | Status: AC

## 2017-11-22 NOTE — Assessment & Plan Note (Signed)
Questionable etiology.  Patient's sodium level is improved.  Continue to follow with primary care physician.

## 2017-11-22 NOTE — Progress Notes (Signed)
_0  ID: Stuart Watson, male    DOB: 10-Jul-1932, 84 y.o.   MRN: [id number]  Chief Complaint  Patient presents with  . Follow-up    PNA     Referring provider: Wenda Low, MD  HPI: 84 yo male former smoker with chronic cough and lung nodules (stable 2014 thru 2017 ) . And recurrent PNA  PMH : Temporal Arteritis /optic neuritis    TEST Echo 07/27/13 >> EF 60 to 65% PFT 04/21/14 >> FEV1 2.58 (98%), FEV1% 73, TLC 6.45 (96%), DLCO 74%, no BD CT chest 12/27/15 >> patchy consolidation LLL, GGO 1.8 cm RUL, patchy GGO RML and RLL CT chest 06/27/16 >> GGO's resolved, no change in nodules  11/22/2017 Follow up : PNA/Hyponatremia  Patient presents for one-week follow-up.  Patient was seen last visit for progressive cough and shortness of breath over the last 6 months.  He had noticed a decreased activity tolerance.  He is quite active and exercises 4 days a week but it noticed that he was unable to complete his exercises as before.  Chest x-ray shows small pleural effusions and a left lower lobe opacity He was treated with a Z-Pak.  Lab work showed a low sodium level at 128.  BNP is normal Since last visit patient is feeling only slightly better. Does think while taking Zpack he was starting to feel better but only a little bit.  Says he has chronic sinus congestion and drainage . Started back on Zyrtec and Veramyst. Some decreased.  He was decreased last visit.  Recheck today and is improved at 13 patient has had some difficulty with hyponatremia as sodium was decreased in December 2018 at 133.2.  Patient is supposed to be on Lasix 20 mg daily but reports he is only taking a half a tablet daily to decrease his urination issues. Continues to have chronic leg swelling. Chest x-ray today shows no improvement with left-sided atelectatic changes and effusion.  No Known Allergies  Immunization History  Administered Date(s) Administered  . Influenza, High Dose Seasonal PF 04/22/2017  .  Influenza,inj,Quad PF,6+ Mos 04/23/2015  . Pneumococcal Conjugate-13 07/23/2016  . Pneumococcal Polysaccharide-23 11/13/2012    Past Medical History:  Diagnosis Date  . Actinic keratoses   . Arthritis   . Colon polyps   . Dyspnea 07/13/2013   BNP normal  . Edema 07/13/2013  . GERD (gastroesophageal reflux disease)    omeprazole 1-2 times daily  . H/O TIA (transient ischemic attack) and stroke    on statins and plavix -saw Dr.Sethi in the past  . History of BPH   . History of TIA (transient ischemic attack) 07/13/2013  . Lumbar radiculopathy   . Need for shingles vaccine   . Optic neuritis 10/2015   left eye  . Perennial allergic rhinitis   . Polymyalgia rheumatica ([place]) 07/13/2013   Daily prednisone  . Restless leg syndrome   . Retinal disease    right eye since birth   . Shingles   . Temporal arteritis ([place]) 10/2015    Tobacco History: Social History   Tobacco Use  Smoking Status Former Smoker  . Packs/day: 1.00  . Years: 15.00  . Pack years: 15.00  . Types: Cigarettes  . Last attempt to quit: 07/23/1975  . Years since quitting: 42.3  Smokeless Tobacco Never Used   Counseling given: Not Answered   Outpatient Encounter Medications as of 11/22/2017  Medication Sig  . acetaminophen (TYLENOL) 325 MG tablet Take 650 mg by mouth  every 6 (six) hours as needed for mild pain.   . cetirizine (ZYRTEC) 10 MG tablet Take 10 mg by mouth daily as needed. Reported on 12/30/2015  . clopidogrel (PLAVIX) 75 MG tablet Take 75 mg by mouth daily with breakfast.  . doxazosin (CARDURA) 8 MG tablet Take 8 mg by mouth daily.  . famotidine (PEPCID) 20 MG tablet Take 20 mg by mouth at bedtime.  . fluticasone (VERAMYST) 27.5 MCG/SPRAY nasal spray Place 2 sprays into the nose daily as needed.   . furosemide (LASIX) 20 MG tablet Take 20 mg by mouth daily.   . simvastatin (ZOCOR) 20 MG tablet Take 20 mg by mouth daily.  Marland Kitchen levofloxacin (LEVAQUIN) 500 MG tablet Take 1 tablet (500 mg total) by  mouth daily for 7 days.  Marland Kitchen omeprazole (PRILOSEC) 20 MG capsule Take 1 capsule (20 mg total) by mouth daily. (Patient not taking: Reported on 11/22/2017)  . [DISCONTINUED] azithromycin (ZITHROMAX) 250 MG tablet Take as directed (Patient not taking: Reported on 11/22/2017)   No facility-administered encounter medications on file as of 11/22/2017.      Review of Systems  Constitutional:   No  weight loss, night sweats,  Fevers, chills,  +fatigue, or  lassitude.  HEENT:   No headaches,  Difficulty swallowing,  Tooth/dental problems, or  Sore throat,                No sneezing, itching, ear ache, nasal congestion, post nasal drip,   CV:  No chest pain,  Orthopnea, PND,  +swelling in lower extremities,  No anasarca, dizziness, palpitations, syncope.   GI  No heartburn, indigestion, abdominal pain, nausea, vomiting, diarrhea, change in bowel habits, loss of appetite, bloody stools.   Resp:   No chest wall deformity  Skin: no rash or lesions.  GU: no dysuria, change in color of urine, no urgency or frequency.  No flank pain, no hematuria   MS:  No joint pain or swelling.  No decreased range of motion.  No back pain.    Physical Exam  BP 132/64 (BP Location: Right Arm, Cuff Size: Normal)   Pulse 74   Ht _0  (1.702 m)   Wt 155 lb 9.6 oz (70.6 kg)   SpO2 96%   BMI 24.37 kg/m   GEN: A/Ox3; pleasant , NAD, elderly    HEENT:  [place]/AT,  EACs-clear, TMs-wnl, NOSE-clear, THROAT-clear, no lesions, no postnasal drip or exudate noted.   NECK:  Supple w/ fair ROM; no JVD; normal carotid impulses w/o bruits; no thyromegaly or nodules palpated; no lymphadenopathy.    RESP  Decreased BS in bases ,  no accessory muscle use, no dullness to percussion  CARD:  RRR, no m/r/g, 1+ peripheral edema, pulses intact, no cyanosis or clubbing.  GI:   Soft & nt; nml bowel sounds; no organomegaly or masses detected.   Musco: Warm bil, no deformities or joint swelling noted.   Neuro: alert, no focal  deficits noted.    Skin: Warm, no lesions or rashes    Lab Results:  CBC    Component Value Date/Time   WBC 5.1 11/13/2017 1150   RBC 4.25 11/13/2017 1150   HGB 14.0 11/13/2017 1150   HCT 40.8 11/13/2017 1150   PLT 351.0 11/13/2017 1150   MCV 96.1 11/13/2017 1150   MCH 31.3 11/09/2015 0220   MCHC 34.4 11/13/2017 1150   RDW 13.8 11/13/2017 1150   LYMPHSABS 1.5 11/13/2017 1150   MONOABS 0.6 11/13/2017 1150   EOSABS  0.1 11/13/2017 1150   BASOSABS 0.0 11/13/2017 1150    BMET    Component Value Date/Time   NA 132 (L) 11/22/2017 0947   K 3.9 11/22/2017 0947   CL 98 11/22/2017 0947   CO2 26 11/22/2017 0947   GLUCOSE 115 (H) 11/22/2017 0947   BUN 15 11/22/2017 0947   CREATININE 0.83 11/22/2017 0947   CALCIUM 8.2 (L) 11/22/2017 0947   GFRNONAA >60 11/09/2015 0220   GFRAA >60 11/09/2015 0220    BNP No results found for: BNP  ProBNP    Component Value Date/Time   PROBNP 31.0 11/13/2017 1150    Imaging: Dg Chest 2 View  Result Date: 11/22/2017 CLINICAL DATA:  History of pneumonia and effusions, cough, follow-up, former smoking history EXAM: CHEST - 2 VIEW COMPARISON:  Chest x-ray of 11/13/2017 FINDINGS: There is little change to perhaps minimal improvement in small bilateral pleural effusions and basilar atelectasis left-greater-than-right. Pneumonia at the left lung base is a definite consideration. Mediastinal and hilar contours are stable and heart size is stable. There are mild degenerative changes in the thoracic spine. IMPRESSION: Little change to minimal improvement in bibasilar opacities consistent with effusion, atelectasis, and possible left basilar pneumonia. Electronically Signed   By: Ivar Drape M.D.   On: 11/22/2017 09:48   Dg Chest 2 View  Result Date: 11/13/2017 CLINICAL DATA:  Dyspnea on exertion, worsening for 6 weeks EXAM: CHEST - 2 VIEW COMPARISON:  02/22/2016 FINDINGS: Small pleural effusions and streaky lower lobe opacity. No generalized Kerley  lines, air bronchogram, or pneumothorax. Normal heart size and mediastinal contours. IMPRESSION: Small pleural effusions and lower lobe opacity favoring atelectasis. Electronically Signed   By: Monte Fantasia M.D.   On: 11/13/2017 12:26     Assessment & Plan:   Pneumonia Suspected Left sided PNA with effusion  No significant change with azithromycin.  Will begin Levaquin 500 mg daily for 7 days.  He will return in 2 weeks if symptoms are not improved and have persistent effusions will need a CT chest and consideration of echo despite a normal BNP. Continue on GERD and rhinitis prevention regimen  Plan  Patient Instructions  Begin Levaquin 578m daily for 1 week . Take with food.  Mucinex DM Twice daily  As needed  Cough/congestion  Extra Furosemide 270mdaily for 3 days then 2079maily .  Add Omeprazole 7m65mily before meal .  Continue on Pepcid 7mg76mbedtime   Follow up in 2 week with chest xray with Dr. Sood Halford ChessmanParrett NP and As needed   Please contact office for sooner follow up if symptoms do not improve or worsen or seek emergency care       Esophageal reflux Add Prilosec continue on current therapy and Pepcid at bedtime  Dyspnea Suspect underlying dyspnea is related to left lower lobe pneumonia with effusion.  BNP was normal. We will continue on antibiotic therapy as above.  If not improving or resolving on chest x-ray will need to consider CT chest and echo  Hyponatremia Questionable etiology.  Patient's sodium level is improved.  Continue to follow with primary care physician.  Edema Lower extremity edema bilaterally suspect is a component of diastolic dysfunction.  Patient remains on Lasix will increase this briefly check be met on return.  Plan  Patient Instructions  Begin Levaquin 500mg 48my for 1 week . Take with food.  Mucinex DM Twice daily  As needed  Cough/congestion  Extra Furosemide 7mg d7m for 3 days  then 35m daily .  Add Omeprazole 274mdaily  before meal .  Continue on Pepcid 20102mt bedtime   Follow up in 2 week with chest xray with Dr. SooHalford Chessmanr Ryenne Lynam NP and As needed   Please contact office for sooner follow up if symptoms do not improve or worsen or seek emergency care          TamRexene EdisonP 11/22/2017

## 2017-11-22 NOTE — Patient Instructions (Addendum)
Begin Levaquin 500mg  daily for 1 week . Take with food.  Mucinex DM Twice daily  As needed  Cough/congestion  Extra Furosemide 20mg  daily for 3 days then 20mg  daily .  Add Omeprazole 20mg  daily before meal .  Continue on Pepcid 20mg  At bedtime   Follow up in 2 week with chest xray with Dr. [name]  Or Norinne Jeane NP and As needed   Please contact office for sooner follow up if symptoms do not improve or worsen or seek emergency care

## 2017-11-22 NOTE — Progress Notes (Signed)
Reviewed and agree with assessment/plan.   Misael Mcgaha, MD [facility] 07/18/2016, 12:24 PM Pager:  [phone]  

## 2017-11-22 NOTE — Assessment & Plan Note (Signed)
Lower extremity edema bilaterally suspect is a component of diastolic dysfunction.  Patient remains on Lasix will increase this briefly check be met on return.  Plan  Patient Instructions  Begin Levaquin '500mg'$  daily for 1 week . Take with food.  Mucinex DM Twice daily  As needed  Cough/congestion  Extra Furosemide '20mg'$  daily for 3 days then '20mg'$  daily .  Add Omeprazole '20mg'$  daily before meal .  Continue on Pepcid '20mg'$  At bedtime   Follow up in 2 week with chest xray with Dr. [name]  Or Derry Arbogast NP and As needed   Please contact office for sooner follow up if symptoms do not improve or worsen or seek emergency care

## 2017-11-22 NOTE — Assessment & Plan Note (Signed)
Add Prilosec continue on current therapy and Pepcid at bedtime

## 2017-11-22 NOTE — Assessment & Plan Note (Signed)
Suspected Left sided PNA with effusion  No significant change with azithromycin.  Will begin Levaquin 500 mg daily for 7 days.  He will return in 2 weeks if symptoms are not improved and have persistent effusions will need a CT chest and consideration of echo despite a normal BNP. Continue on GERD and rhinitis prevention regimen  Plan  Patient Instructions  Begin Levaquin 500mg  daily for 1 week . Take with food.  Mucinex DM Twice daily  As needed  Cough/congestion  Extra Furosemide 20mg  daily for 3 days then 20mg  daily .  Add Omeprazole 20mg  daily before meal .  Continue on Pepcid 20mg  At bedtime   Follow up in 2 week with chest xray with Dr. [name]  Or Parrett NP and As needed   Please contact office for sooner follow up if symptoms do not improve or worsen or seek emergency care

## 2017-11-22 NOTE — Assessment & Plan Note (Signed)
Suspect underlying dyspnea is related to left lower lobe pneumonia with effusion.  BNP was normal. We will continue on antibiotic therapy as above.  If not improving or resolving on chest x-ray will need to consider CT chest and echo

## 2017-11-28 ENCOUNTER — Telehealth: Payer: Self-pay | Admitting: Pulmonary Disease

## 2017-11-28 NOTE — Telephone Encounter (Signed)
He needs to come in for ROV.

## 2017-11-28 NOTE — Telephone Encounter (Signed)
Called and spoke with patient, he states that he is finishing his second antibiotic and he cannot tell any difference from before. His symptoms are SOB, and weakness/ no energy.

## 2017-11-28 NOTE — Telephone Encounter (Signed)
Called and spoke with patient, scheduled him with [name], NP

## 2017-11-29 ENCOUNTER — Other Ambulatory Visit (HOSPITAL_COMMUNITY): Payer: Self-pay | Admitting: Pulmonary Disease

## 2017-11-29 ENCOUNTER — Ambulatory Visit (INDEPENDENT_AMBULATORY_CARE_PROVIDER_SITE_OTHER)
Admission: RE | Admit: 2017-11-29 | Discharge: 2017-11-29 | Disposition: A | Discharge status: Home / Self Care | Payer: Medicare Other | Source: Ambulatory Visit | Attending: Pulmonary Disease | Admitting: Pulmonary Disease

## 2017-11-29 ENCOUNTER — Encounter: Payer: Self-pay | Admitting: Pulmonary Disease

## 2017-11-29 ENCOUNTER — Ambulatory Visit (INDEPENDENT_AMBULATORY_CARE_PROVIDER_SITE_OTHER): Payer: Medicare Other | Admitting: Pulmonary Disease

## 2017-11-29 VITALS — BP 118/60 | HR 88 | Temp 97.3°F | Ht 67.0 in | Wt 156.0 lb

## 2017-11-29 DIAGNOSIS — J181 Lobar pneumonia, unspecified organism: Secondary | ICD-10-CM | POA: Diagnosis not present

## 2017-11-29 DIAGNOSIS — R609 Edema, unspecified: Secondary | ICD-10-CM | POA: Diagnosis not present

## 2017-11-29 DIAGNOSIS — R0602 Shortness of breath: Secondary | ICD-10-CM

## 2017-11-29 DIAGNOSIS — J189 Pneumonia, unspecified organism: Secondary | ICD-10-CM

## 2017-11-29 DIAGNOSIS — R1310 Dysphagia, unspecified: Secondary | ICD-10-CM

## 2017-11-29 NOTE — Progress Notes (Signed)
@Patient  ID: Stuart Watson, male    DOB: 1931/08/12, 84 y.o.   MRN: [id number]  Chief Complaint  Patient presents with  . Acute Visit    increased SOB with minimal activity x 1 week, non-productive cough and increased weakness.    Referring provider: Wenda Low, MD  HPI:  84 year old male former smoker with chronic cough and lung nodules (stable 2014 10/10/2015).  And recurrent pneumonia. Stuart Watson patient for previous aspiration pneumonitis.  Past medical history- temporal arteritis optic neuritis, GERD (on PPI and H2), AR   Tests- Echo 07/27/2013--EF 60 to 65% PFT 04/21/2014--FEV1 2.18 (98%), FEV1 73, TLC 6.45 (96%), DLCO 74%, no BD CT chest 12/27/2015-test patchy consolidation LLL, GGO space 1.9 cm R UL, patchy GGO RML and RLL  GI Tests: 04/18/16 - Upper Endoscopy and [facility]  CT chest 06/27/2016--GGO is resolved, no change in nodules 11/13/2017-chest x-ray-small pleural effusions and lower lobe opacity favoring atelectasis 11/22/2017- chest x-ray- little change from 4/24 x-ray, minimal improvement in the pleural effusions, pneumonia at left lung base is definite consideration  Labs- 11/13/17 - Sodium level  128 11/22/17 - Sodium level 132-hyponatremia resolving 11/13/17-BNP-31  12/30/2015- appointment with Stuart Watson Had been seen since 2015 with Dr. clients for cough.  Said that sometimes he gets a cough and feels like things get stuck in his throat. At this time was thought to have recurrent aspiration which was leaving leading to development of pneumonitis  02/22/2016-follow-up for aspiration pneumonia Patient return to office is feeling better reporting cough has improved.  Has been off steroids for 2 weeks.  Will get follow-up CT in 6 months.  06/29/2016-office visit >>>Seen by Stuart Watson instructed to follow-up in a year stable and doing well.  CT chest reviewed GGO is resolved.  Nodule stable.  11/13/2017-acute visit >>>Patient states that reporting today because for the last 6 months  they have not been feeling as well.  Feels activity tolerance is decreasing gets more winded easily.  Reporting daily dry cough.  As well as chronic ankle edema.  Taking Lasix daily.  Being followed by GI for GERD.  Chest x-ray at appointment reveals potential pneumonia.  Started on Z-Pak. Hyponatremic with labs.   11/22/17 office visit pneumonia hyponatremia >>>Patient presents from a one-week follow-up still has a progressive cough and shortness of breath over the last 6 months.  Patient is supposed to be on Lasix 20 mg daily but reports he is only taking 1/2 tablet to decrease his urination issues.  He continues to have chronic leg swelling.  Chest x-ray today shows no improvement with left-sided atelectatic changes and effusion.  Patient was initially treated with a Z-Pak.  Patient started on Levaquin (11/22/17).  The patient's hyponatremia has improved.  BNP at this point in time is normal.   11/29/2017 OV >>>Patient returns to office today after complaints of continued shortness of breath, fatigue, and no improvement after 2 rounds of antibiotics (Z-Pak, Levaquin).  Patient states he noticed improvement in edema with 3-day trial of 40 mg of Lasix since last appointment.  Patient states edema returned as soon as he resume 20 mg daily.  Patient feels that he is actually feeling worse than how he felt prior to the Levaquin being started.  Wife mentions during ROS in initial patient interview the patient has been having continued episodes of feeling like he is choking while eating and choking when he coughs.  Pt to get stat chest x-ray today in office.   No Known Allergies  Immunization History  Administered Date(s) Administered  . Influenza, High Dose Seasonal PF 04/22/2017  . Influenza,inj,Quad PF,6+ Mos 04/23/2015  . Pneumococcal Conjugate-13 07/23/2016  . Pneumococcal Polysaccharide-23 11/13/2012    Past Medical History:  Diagnosis Date  . Actinic keratoses   . Arthritis   . Colon polyps     . Dyspnea 07/13/2013   BNP normal  . Edema 07/13/2013  . GERD (gastroesophageal reflux disease)    omeprazole 1-2 times daily  . H/O TIA (transient ischemic attack) and stroke    on statins and plavix -saw Stuart Watson in the past  . History of BPH   . History of TIA (transient ischemic attack) 07/13/2013  . Lumbar radiculopathy   . Need for shingles vaccine   . Optic neuritis 10/2015   left eye  . Perennial allergic rhinitis   . Polymyalgia rheumatica ([place]) 07/13/2013   Daily prednisone  . Restless leg syndrome   . Retinal disease    right eye since birth   . Shingles   . Temporal arteritis ([place]) 10/2015    Tobacco History: Social History   Tobacco Use  Smoking Status Former Smoker  . Packs/day: 1.00  . Years: 15.00  . Pack years: 15.00  . Types: Cigarettes  . Last attempt to quit: 07/23/1975  . Years since quitting: 42.3  Smokeless Tobacco Never Used   Counseling given: Not Answered   Outpatient Encounter Medications as of 11/29/2017  Medication Sig  . acetaminophen (TYLENOL) 325 MG tablet Take 650 mg by mouth every 6 (six) hours as needed for mild pain.   . cetirizine (ZYRTEC) 10 MG tablet Take 10 mg by mouth daily as needed. Reported on 12/30/2015  . clopidogrel (PLAVIX) 75 MG tablet Take 75 mg by mouth daily with breakfast.  . doxazosin (CARDURA) 8 MG tablet Take 8 mg by mouth daily.  . famotidine (PEPCID) 20 MG tablet Take 20 mg by mouth at bedtime.  . fluticasone (VERAMYST) 27.5 MCG/SPRAY nasal spray Place 2 sprays into the nose daily as needed.   . furosemide (LASIX) 20 MG tablet Take 20 mg by mouth daily.   Marland Kitchen omeprazole (PRILOSEC) 20 MG capsule Take 1 capsule (20 mg total) by mouth daily.  . simvastatin (ZOCOR) 20 MG tablet Take 20 mg by mouth daily.  Marland Kitchen levofloxacin (LEVAQUIN) 500 MG tablet Take 1 tablet (500 mg total) by mouth daily for 7 days. (Patient not taking: Reported on 11/29/2017)   No facility-administered encounter medications on file as of 11/29/2017.       Review of Systems  Constitutional:   +chills occasionally, No  weight loss, night sweats,  Fevers, fatigue, or  lassitude.  HEENT:   +difficulty swallowing, choking sensation, chronic post nasal drip,  No headaches,Tooth/dental problems, or  Sore throat, No sneezing, itching, ear ache, nasal congestion  CV:  +swelling in LE bilaterally, improved with previous increase in lasix No chest pain,  Orthopnea, PND, dizziness, palpitations, syncope.   GI  No heartburn, indigestion, abdominal pain, nausea, vomiting, diarrhea, change in bowel habits, loss of appetite, bloody stools.   Resp: +sob with exertion, continued limited exertion,No excess mucus, no productive cough,  No non-productive cough,  No coughing up of blood.  No change in color of mucus.  No wheezing.  No chest wall deformity  Skin: no rash or lesions.  GU: no dysuria, change in color of urine, no urgency or frequency.  No flank pain, no hematuria   MS:  No joint pain or swelling.  No  decreased range of motion.  No back pain.   Physical Exam  BP 118/60 (BP Location: Left Arm, Cuff Size: Normal)   Pulse 88   Temp (!) 97.3 F (36.3 C) (Oral)   Ht 5\' 7"  (1.702 m)   Wt 156 lb (70.8 kg)   SpO2 96%   BMI 24.43 kg/m   GEN: A/Ox3; pleasant , NAD, well nourished    HEENT:  [place]/AT,  EACs-clear, moderate cerumen, TMs-difficult to visualize due to cerumen, NOSE-clear, THROAT- +post nasal drip, no lesions  NECK:  Supple w/ fair ROM; no JVD; normal carotid impulses w/o bruits; no thyromegaly or nodules palpated; no lymphadenopathy.    RESP  Diminished in bases but otherwise clear  P & A; w/o, wheezes/ rales/ or rhonchi. no accessory muscle use, no dullness to percussion  CARD:  +bilateral 3+ lower extremity edema, RRR, no m/r/g, pulses intact, no cyanosis or clubbing.  GI:   Soft & nt; nml bowel sounds; no organomegaly or masses detected.   Musco: Warm bil, no deformities or joint swelling noted.   Neuro: alert, no focal  deficits noted.    Skin: Warm, nail beds on hands bilaterally with infection - derm is following per patient   Lab Results:  CBC    Component Value Date/Time   WBC 5.1 11/13/2017 1150   RBC 4.25 11/13/2017 1150   HGB 14.0 11/13/2017 1150   HCT 40.8 11/13/2017 1150   PLT 351.0 11/13/2017 1150   MCV 96.1 11/13/2017 1150   MCH 31.3 11/09/2015 0220   MCHC 34.4 11/13/2017 1150   RDW 13.8 11/13/2017 1150   LYMPHSABS 1.5 11/13/2017 1150   MONOABS 0.6 11/13/2017 1150   EOSABS 0.1 11/13/2017 1150   BASOSABS 0.0 11/13/2017 1150    BMET    Component Value Date/Time   NA 132 (L) 11/22/2017 0947   K 3.9 11/22/2017 0947   CL 98 11/22/2017 0947   CO2 26 11/22/2017 0947   GLUCOSE 115 (H) 11/22/2017 0947   BUN 15 11/22/2017 0947   CREATININE 0.83 11/22/2017 0947   CALCIUM 8.2 (L) 11/22/2017 0947   GFRNONAA >60 11/09/2015 0220   GFRAA >60 11/09/2015 0220    BNP No results found for: BNP  ProBNP    Component Value Date/Time   PROBNP 31.0 11/13/2017 1150    Imaging: Dg Chest 2 View  Result Date: 11/29/2017 CLINICAL DATA:  Shortness of breath and weakness. EXAM: CHEST - 2 VIEW COMPARISON:  11/22/2017 FINDINGS: Normal heart size. Mild aortic atherosclerosis. There is persistent bilateral pleural effusions, left greater than right. Streaky areas of atelectasis within both lung bases again noted. Spondylosis noted within the thoracic spine. IMPRESSION: 1. No change in bilateral pleural effusions, left greater than right. 2.  Aortic Atherosclerosis (ICD10-I70.0). Electronically Signed   By: Kerby Moors M.D.   On: 11/29/2017 09:40   Dg Chest 2 View  Result Date: 11/22/2017 CLINICAL DATA:  History of pneumonia and effusions, cough, follow-up, former smoking history EXAM: CHEST - 2 VIEW COMPARISON:  Chest x-ray of 11/13/2017 FINDINGS: There is little change to perhaps minimal improvement in small bilateral pleural effusions and basilar atelectasis left-greater-than-right. Pneumonia at  the left lung base is a definite consideration. Mediastinal and hilar contours are stable and heart size is stable. There are mild degenerative changes in the thoracic spine. IMPRESSION: Little change to minimal improvement in bibasilar opacities consistent with effusion, atelectasis, and possible left basilar pneumonia. Electronically Signed   By: Ivar Drape M.D.   On:  11/22/2017 09:48   Dg Chest 2 View  Result Date: 11/13/2017 CLINICAL DATA:  Dyspnea on exertion, worsening for 6 weeks EXAM: CHEST - 2 VIEW COMPARISON:  02/22/2016 FINDINGS: Small pleural effusions and streaky lower lobe opacity. No generalized Kerley lines, air bronchogram, or pneumothorax. Normal heart size and mediastinal contours. IMPRESSION: Small pleural effusions and lower lobe opacity favoring atelectasis. Electronically Signed   By: Monte Fantasia M.D.   On: 11/13/2017 12:26     Assessment & Plan:   Pneumonia X-ray today CT with contrast Follow-up in 2 weeks Modified barium swallow  Follow-up with Stuart Watson in June  Edema Lasix 40 mg daily for 2 weeks Echo ordered today We will follow-up in 2 weeks   Dyspnea CT chest X-ray today 40 mg Lasix daily for 2 weeks Follow-up in office in 2 weeks    This appointment was 45 minutes long.  Reviewing previous CT.  Reviewing previous chest x-rays from 4/24, 5/3, and today's stat chest x-ray.  Involved patient discussion explaining reason for echocardiogram, new CT, increasing Lasix, and coordinating plan of care.  Over 50% of the time was spent with direct patient care and education.   Lauraine Rinne, NP 11/29/2017

## 2017-11-29 NOTE — Assessment & Plan Note (Signed)
CT chest X-ray today 40 mg Lasix daily for 2 weeks Follow-up in office in 2 weeks

## 2017-11-29 NOTE — Assessment & Plan Note (Signed)
Lasix 40 mg daily for 2 weeks Echo ordered today We will follow-up in 2 weeks

## 2017-11-29 NOTE — Patient Instructions (Addendum)
Lasix 40mg  daily for 2 weeks  Echo to be ordered today to evaluate your Heart Functioning  Barium Swallow test ordered today to evaluate swallowing / aspiration / GERD CT Chest with contrast ordered today  Follow up in office in 2 weeks  Follow up with Sood - 01/01/18  Pulmonary Function Test - 01/01/18   Please contact the office if your symptoms worsen or you have concerns that you are not improving.   Thank you for choosing [facility] for your healthcare, and for allowing [place] to partner with you on your healthcare journey. I am thankful to be able to provide care to you today.   [name] FNP-C

## 2017-11-29 NOTE — Assessment & Plan Note (Signed)
X-ray today CT with contrast Follow-up in 2 weeks Modified barium swallow  Follow-up with Dr. [name] in June

## 2017-12-02 NOTE — Progress Notes (Signed)
Reviewed and agree with assessment/plan.   Shamiah Kahler, MD [facility] 07/18/2016, 12:24 PM Pager:  [phone]  

## 2017-12-04 ENCOUNTER — Other Ambulatory Visit: Payer: Self-pay

## 2017-12-04 ENCOUNTER — Ambulatory Visit (INDEPENDENT_AMBULATORY_CARE_PROVIDER_SITE_OTHER)
Admission: RE | Admit: 2017-12-04 | Discharge: 2017-12-04 | Disposition: A | Discharge status: Home / Self Care | Payer: Medicare Other | Source: Ambulatory Visit | Attending: Pulmonary Disease | Admitting: Pulmonary Disease

## 2017-12-04 ENCOUNTER — Ambulatory Visit (HOSPITAL_COMMUNITY): Payer: Medicare Other | Attending: Interventional Cardiology

## 2017-12-04 DIAGNOSIS — R609 Edema, unspecified: Secondary | ICD-10-CM

## 2017-12-04 DIAGNOSIS — J181 Lobar pneumonia, unspecified organism: Secondary | ICD-10-CM

## 2017-12-04 DIAGNOSIS — J90 Pleural effusion, not elsewhere classified: Secondary | ICD-10-CM | POA: Insufficient documentation

## 2017-12-04 DIAGNOSIS — E785 Hyperlipidemia, unspecified: Secondary | ICD-10-CM | POA: Diagnosis not present

## 2017-12-04 DIAGNOSIS — R0602 Shortness of breath: Secondary | ICD-10-CM

## 2017-12-04 DIAGNOSIS — I340 Nonrheumatic mitral (valve) insufficiency: Secondary | ICD-10-CM | POA: Insufficient documentation

## 2017-12-04 DIAGNOSIS — Z87891 Personal history of nicotine dependence: Secondary | ICD-10-CM | POA: Diagnosis not present

## 2017-12-04 DIAGNOSIS — J189 Pneumonia, unspecified organism: Secondary | ICD-10-CM

## 2017-12-04 DIAGNOSIS — I509 Heart failure, unspecified: Secondary | ICD-10-CM | POA: Insufficient documentation

## 2017-12-04 DIAGNOSIS — Z8673 Personal history of transient ischemic attack (TIA), and cerebral infarction without residual deficits: Secondary | ICD-10-CM | POA: Diagnosis not present

## 2017-12-04 MED ORDER — IOPAMIDOL (ISOVUE-300) INJECTION 61%
80.0000 mL | Freq: Once | INTRAVENOUS | Status: AC | PRN
  Administered 2017-12-04: 80 mL via INTRAVENOUS

## 2017-12-05 NOTE — Progress Notes (Signed)
Your CT results confirm what we thought in your appointment that you are retaining fluid. There are bilateral effusions in your lungs.   We will wait to see the results of the Echocardiogram you completed yesterday. Based off of the Echocardiogram result we will follow up with you and probably continue the increase of the lasix and referral back to cardiology for long term management of diuretic treatment plan.   No changes for now. Will follow back up when we have your Echocardiogram results.   [name] FNP-C

## 2017-12-06 ENCOUNTER — Telehealth: Payer: Self-pay | Admitting: Pulmonary Disease

## 2017-12-06 ENCOUNTER — Ambulatory Visit: Payer: Medicare Other | Admitting: Adult Health

## 2017-12-06 ENCOUNTER — Emergency Department (HOSPITAL_COMMUNITY): Payer: Medicare Other

## 2017-12-06 ENCOUNTER — Other Ambulatory Visit: Payer: Self-pay

## 2017-12-06 ENCOUNTER — Observation Stay (HOSPITAL_COMMUNITY)
Admission: EM | Admit: 2017-12-06 | Discharge: 2017-12-08 | Disposition: A | Discharge status: Home / Self Care | Payer: Medicare Other | Attending: Internal Medicine | Admitting: Internal Medicine

## 2017-12-06 ENCOUNTER — Encounter (HOSPITAL_COMMUNITY): Payer: Self-pay

## 2017-12-06 DIAGNOSIS — Z9889 Other specified postprocedural states: Secondary | ICD-10-CM

## 2017-12-06 DIAGNOSIS — Z7951 Long term (current) use of inhaled steroids: Secondary | ICD-10-CM | POA: Diagnosis not present

## 2017-12-06 DIAGNOSIS — R053 Chronic cough: Secondary | ICD-10-CM | POA: Diagnosis present

## 2017-12-06 DIAGNOSIS — L608 Other nail disorders: Secondary | ICD-10-CM | POA: Insufficient documentation

## 2017-12-06 DIAGNOSIS — Z79899 Other long term (current) drug therapy: Secondary | ICD-10-CM | POA: Insufficient documentation

## 2017-12-06 DIAGNOSIS — Z86010 Personal history of colonic polyps: Secondary | ICD-10-CM | POA: Diagnosis not present

## 2017-12-06 DIAGNOSIS — I2510 Atherosclerotic heart disease of native coronary artery without angina pectoris: Secondary | ICD-10-CM | POA: Insufficient documentation

## 2017-12-06 DIAGNOSIS — Z7901 Long term (current) use of anticoagulants: Secondary | ICD-10-CM | POA: Insufficient documentation

## 2017-12-06 DIAGNOSIS — R0609 Other forms of dyspnea: Secondary | ICD-10-CM

## 2017-12-06 DIAGNOSIS — R0600 Dyspnea, unspecified: Secondary | ICD-10-CM | POA: Insufficient documentation

## 2017-12-06 DIAGNOSIS — D649 Anemia, unspecified: Secondary | ICD-10-CM | POA: Insufficient documentation

## 2017-12-06 DIAGNOSIS — R609 Edema, unspecified: Secondary | ICD-10-CM | POA: Insufficient documentation

## 2017-12-06 DIAGNOSIS — E871 Hypo-osmolality and hyponatremia: Secondary | ICD-10-CM | POA: Insufficient documentation

## 2017-12-06 DIAGNOSIS — R059 Cough, unspecified: Secondary | ICD-10-CM

## 2017-12-06 DIAGNOSIS — I313 Pericardial effusion (noninflammatory): Secondary | ICD-10-CM | POA: Insufficient documentation

## 2017-12-06 DIAGNOSIS — M5416 Radiculopathy, lumbar region: Secondary | ICD-10-CM | POA: Diagnosis not present

## 2017-12-06 DIAGNOSIS — K219 Gastro-esophageal reflux disease without esophagitis: Secondary | ICD-10-CM | POA: Diagnosis present

## 2017-12-06 DIAGNOSIS — L605 Yellow nail syndrome: Secondary | ICD-10-CM

## 2017-12-06 DIAGNOSIS — R0602 Shortness of breath: Secondary | ICD-10-CM

## 2017-12-06 DIAGNOSIS — H47012 Ischemic optic neuropathy, left eye: Secondary | ICD-10-CM | POA: Diagnosis not present

## 2017-12-06 DIAGNOSIS — N400 Enlarged prostate without lower urinary tract symptoms: Secondary | ICD-10-CM | POA: Diagnosis not present

## 2017-12-06 DIAGNOSIS — I509 Heart failure, unspecified: Secondary | ICD-10-CM | POA: Insufficient documentation

## 2017-12-06 DIAGNOSIS — Z8673 Personal history of transient ischemic attack (TIA), and cerebral infarction without residual deficits: Secondary | ICD-10-CM

## 2017-12-06 DIAGNOSIS — R05 Cough: Secondary | ICD-10-CM | POA: Insufficient documentation

## 2017-12-06 DIAGNOSIS — Z87891 Personal history of nicotine dependence: Secondary | ICD-10-CM | POA: Insufficient documentation

## 2017-12-06 DIAGNOSIS — J90 Pleural effusion, not elsewhere classified: Principal | ICD-10-CM

## 2017-12-06 DIAGNOSIS — R600 Localized edema: Secondary | ICD-10-CM

## 2017-12-06 DIAGNOSIS — I5030 Unspecified diastolic (congestive) heart failure: Secondary | ICD-10-CM | POA: Insufficient documentation

## 2017-12-06 DIAGNOSIS — M353 Polymyalgia rheumatica: Secondary | ICD-10-CM | POA: Diagnosis present

## 2017-12-06 LAB — CBC WITH DIFFERENTIAL/PLATELET
Basophils Absolute: 0 10*3/uL (ref 0.0–0.1)
Basophils Relative: 1 %
EOS ABS: 0.2 10*3/uL (ref 0.0–0.7)
Eosinophils Relative: 3 %
HCT: 38.9 % — ABNORMAL LOW (ref 39.0–52.0)
HEMOGLOBIN: 13.8 g/dL (ref 13.0–17.0)
LYMPHS ABS: 2.1 10*3/uL (ref 0.7–4.0)
LYMPHS PCT: 33 %
MCH: 32.9 pg (ref 26.0–34.0)
MCHC: 35.5 g/dL (ref 30.0–36.0)
MCV: 92.8 fL (ref 78.0–100.0)
Monocytes Absolute: 0.4 10*3/uL (ref 0.1–1.0)
Monocytes Relative: 7 %
NEUTROS PCT: 56 %
Neutro Abs: 3.6 10*3/uL (ref 1.7–7.7)
Platelets: 278 10*3/uL (ref 150–400)
RBC: 4.19 MIL/uL — AB (ref 4.22–5.81)
RDW: 13.5 % (ref 11.5–15.5)
WBC: 6.4 10*3/uL (ref 4.0–10.5)

## 2017-12-06 LAB — BASIC METABOLIC PANEL
Anion gap: 9 (ref 5–15)
BUN: 13 mg/dL (ref 6–20)
CHLORIDE: 97 mmol/L — AB (ref 101–111)
CO2: 25 mmol/L (ref 22–32)
Calcium: 8.1 mg/dL — ABNORMAL LOW (ref 8.9–10.3)
Creatinine, Ser: 0.73 mg/dL (ref 0.61–1.24)
GFR calc non Af Amer: >60 mL/min (ref >60)
Glucose, Bld: 108 mg/dL — ABNORMAL HIGH (ref 65–99)
POTASSIUM: 4.1 mmol/L (ref 3.5–5.1)
SODIUM: 131 mmol/L — AB (ref 135–145)

## 2017-12-06 LAB — I-STAT TROPONIN, ED: Troponin i, poc: 0.01 ng/mL (ref 0.00–0.08)

## 2017-12-06 LAB — BRAIN NATRIURETIC PEPTIDE: B NATRIURETIC PEPTIDE 5: 45.5 pg/mL (ref 0.0–100.0)

## 2017-12-06 MED ORDER — FUROSEMIDE 10 MG/ML IJ SOLN
80.0000 mg | Freq: Two times a day (BID) | INTRAMUSCULAR | Status: DC
  Administered 2017-12-06: 80 mg via INTRAVENOUS
  Filled 2017-12-06: qty 8

## 2017-12-06 MED ORDER — FAMOTIDINE 20 MG PO TABS
20.0000 mg | ORAL_TABLET | Freq: Every day | ORAL | Status: DC
  Administered 2017-12-06 – 2017-12-07 (×2): 20 mg via ORAL
  Filled 2017-12-06 (×2): qty 1

## 2017-12-06 MED ORDER — SENNA 8.6 MG PO TABS
1.0000 | ORAL_TABLET | Freq: Two times a day (BID) | ORAL | Status: DC
  Administered 2017-12-06 – 2017-12-07 (×2): 8.6 mg via ORAL
  Filled 2017-12-06 (×2): qty 1

## 2017-12-06 MED ORDER — ACETAMINOPHEN 650 MG RE SUPP: 650.0000 mg | Freq: Four times a day (QID) | RECTAL | Status: DC | PRN

## 2017-12-06 MED ORDER — IPRATROPIUM-ALBUTEROL 0.5-2.5 (3) MG/3ML IN SOLN: 3.0000 mL | Freq: Four times a day (QID) | RESPIRATORY_TRACT | Status: DC | PRN

## 2017-12-06 MED ORDER — IPRATROPIUM-ALBUTEROL 0.5-2.5 (3) MG/3ML IN SOLN
3.0000 mL | Freq: Four times a day (QID) | RESPIRATORY_TRACT | Status: DC
  Administered 2017-12-06: 3 mL via RESPIRATORY_TRACT

## 2017-12-06 MED ORDER — POLYETHYLENE GLYCOL 3350 17 G PO PACK: 17.0000 g | PACK | Freq: Every day | ORAL | Status: DC | PRN

## 2017-12-06 MED ORDER — ENOXAPARIN SODIUM 40 MG/0.4ML ~~LOC~~ SOLN
40.0000 mg | SUBCUTANEOUS | Status: DC
  Administered 2017-12-06: 40 mg via SUBCUTANEOUS
  Filled 2017-12-06: qty 0.4

## 2017-12-06 MED ORDER — CLOPIDOGREL BISULFATE 75 MG PO TABS
75.0000 mg | ORAL_TABLET | Freq: Every day | ORAL | Status: DC
  Administered 2017-12-08: 75 mg via ORAL
  Filled 2017-12-06: qty 1

## 2017-12-06 MED ORDER — DM-GUAIFENESIN ER 30-600 MG PO TB12
1.0000 | ORAL_TABLET | Freq: Two times a day (BID) | ORAL | Status: DC | PRN
  Administered 2017-12-06: 1 via ORAL
  Filled 2017-12-06: qty 1

## 2017-12-06 MED ORDER — DOXAZOSIN MESYLATE 4 MG PO TABS
8.0000 mg | ORAL_TABLET | Freq: Every day | ORAL | Status: DC
  Administered 2017-12-07 – 2017-12-08 (×2): 8 mg via ORAL
  Filled 2017-12-06 (×3): qty 2

## 2017-12-06 MED ORDER — SIMVASTATIN 20 MG PO TABS
20.0000 mg | ORAL_TABLET | Freq: Every day | ORAL | Status: DC
  Administered 2017-12-07 – 2017-12-08 (×2): 20 mg via ORAL
  Filled 2017-12-06 (×3): qty 1

## 2017-12-06 MED ORDER — IPRATROPIUM-ALBUTEROL 0.5-2.5 (3) MG/3ML IN SOLN: 3.0000 mL | Freq: Three times a day (TID) | RESPIRATORY_TRACT | Status: DC

## 2017-12-06 MED ORDER — SODIUM CHLORIDE 0.9% FLUSH: 3.0000 mL | INTRAVENOUS | Status: DC | PRN

## 2017-12-06 MED ORDER — ACETAMINOPHEN 325 MG PO TABS: 650.0000 mg | ORAL_TABLET | Freq: Four times a day (QID) | ORAL | Status: DC | PRN

## 2017-12-06 MED ORDER — IPRATROPIUM-ALBUTEROL 0.5-2.5 (3) MG/3ML IN SOLN
RESPIRATORY_TRACT | Status: AC
  Filled 2017-12-06: qty 3

## 2017-12-06 MED ORDER — ALBUTEROL SULFATE (2.5 MG/3ML) 0.083% IN NEBU
5.0000 mg | INHALATION_SOLUTION | Freq: Once | RESPIRATORY_TRACT | Status: AC
  Administered 2017-12-06: 5 mg via RESPIRATORY_TRACT
  Filled 2017-12-06: qty 6

## 2017-12-06 MED ORDER — ADULT MULTIVITAMIN W/MINERALS CH
1.0000 | ORAL_TABLET | Freq: Every day | ORAL | Status: DC
  Administered 2017-12-07 – 2017-12-08 (×2): 1 via ORAL
  Filled 2017-12-06 (×4): qty 1

## 2017-12-06 MED ORDER — FLUTICASONE PROPIONATE 50 MCG/ACT NA SUSP
2.0000 | Freq: Every day | NASAL | Status: DC
  Administered 2017-12-07 – 2017-12-08 (×2): 2 via NASAL
  Filled 2017-12-06: qty 16

## 2017-12-06 MED ORDER — ONDANSETRON HCL 4 MG/2ML IJ SOLN
4.0000 mg | Freq: Four times a day (QID) | INTRAMUSCULAR | Status: DC | PRN
Start: 2017-12-06 — End: 2017-12-08

## 2017-12-06 MED ORDER — SODIUM CHLORIDE 0.9% FLUSH
3.0000 mL | Freq: Two times a day (BID) | INTRAVENOUS | Status: DC
  Administered 2017-12-06 – 2017-12-08 (×4): 3 mL via INTRAVENOUS

## 2017-12-06 MED ORDER — LORATADINE 10 MG PO TABS
10.0000 mg | ORAL_TABLET | Freq: Every day | ORAL | Status: DC
  Administered 2017-12-07 – 2017-12-08 (×2): 10 mg via ORAL
  Filled 2017-12-06 (×3): qty 1

## 2017-12-06 MED ORDER — SODIUM CHLORIDE 0.9 % IV SOLN: 250.0000 mL | INTRAVENOUS | Status: DC | PRN

## 2017-12-06 MED ORDER — ONDANSETRON HCL 4 MG PO TABS: 4.0000 mg | ORAL_TABLET | Freq: Four times a day (QID) | ORAL | Status: DC | PRN

## 2017-12-06 MED ORDER — DOCUSATE SODIUM 100 MG PO CAPS
100.0000 mg | ORAL_CAPSULE | Freq: Two times a day (BID) | ORAL | Status: DC
Start: 2017-12-06 — End: 2017-12-08
  Administered 2017-12-06 – 2017-12-07 (×2): 100 mg via ORAL
  Filled 2017-12-06 (×3): qty 1

## 2017-12-06 NOTE — ED Triage Notes (Signed)
Pt reports increase SOB and coughing worsening over the last 2 days. He does have bilateral pleural effusions seen on a CT scan on Wednesday. He also had an echo on Wednesday and the results recommended that he come here for a thoracentesis. His lasix were recently increased, which has helped is leg swelling, but have not helped his SOB. A&Ox4. No distress in triage. Denies chest pain.

## 2017-12-06 NOTE — Telephone Encounter (Signed)
Called and spoke to patient's wife. She stated that she is concerned about patient's breathing. Stated that he is unable to lay down but breathing is slightly better while sitting up. She also stated that he has developed wheezing and reports that as being a new symptom. Wants to be seen or considering ED.  Acute visit scheduled with TP for 1400 today. Nothing further is needed at this time.

## 2017-12-06 NOTE — ED Notes (Signed)
Ambulated pt to the bathroom and back to room.  Lowest O2 sat was 96%.

## 2017-12-06 NOTE — ED Provider Notes (Signed)
[facility] DEPT Provider Note   CSN: [id number] Arrival date & time: 12/06/17  1143     History   Chief Complaint Chief Complaint  Patient presents with  . Shortness of Breath    HPI Stuart Watson is a 84 y.o. male with PMH/o dyspnea, GERD, TIA, BPH, pleural effusions, PMR who presents for evaluation of progressively worsening shortness of breath.  Patient reports that this is been an ongoing issue for several months.  He states that he has been followed by pulmonology to determine what the sources.  He states that he recently had a CT that confirmed pleural effusion.  He was sent to get an echo which she completed and states that next week he will have a swallowing test.  Patient reports that he woke up this morning and states that he was having difficulty breathing.  He states that his shortness of breath is worse in the night and worse when he lays down flat.  Patient reports that he went and sat up in a chair and states that his symptoms eventually resolved.  Patient reports that he also has chronic cough.  Cough is nonproductive and is worse at night.  Patient reports that he also has swelling in his bilateral lower extremities.  No overlying warmth, erythema.  Patient reports that he was recently on 20 mg of Lasix but they bumped him up to 40 mg a few weeks ago when he was still having difficulty breathing.  Patient feels like he has had some increased fluid in his abdomen.  Patient states that he has not had any fevers, chest pain, abdominal pain, nausea/vomiting.  Patient reports he is a former smoker.  The history is provided by the patient.    Past Medical History:  Diagnosis Date  . Actinic keratoses   . Arthritis   . Colon polyps   . Dyspnea 07/13/2013   BNP normal  . Edema 07/13/2013  . GERD (gastroesophageal reflux disease)    omeprazole 1-2 times daily  . H/O TIA (transient ischemic attack) and stroke    on statins and plavix -saw  Dr.Sethi in the past  . History of BPH   . History of TIA (transient ischemic attack) 07/13/2013  . Lumbar radiculopathy   . Need for shingles vaccine   . Optic neuritis 10/2015   left eye  . Perennial allergic rhinitis   . Polymyalgia rheumatica ([place]) 07/13/2013   Daily prednisone  . Restless leg syndrome   . Retinal disease    right eye since birth   . Shingles   . Temporal arteritis ([place]) 10/2015    Patient Active Problem List   Diagnosis Date Noted  . Chronic bilateral pleural effusions 12/06/2017  . Hyponatremia 11/22/2017  . Esophageal reflux 04/18/2016  . Pneumonia 02/22/2016  . Nail fungus 11/09/2015  . Optic neuritis 11/08/2015  . Chronic cough 04/02/2014  . Perirectal abscess 12/25/2013  . Coronary artery calcification 08/18/2013  . Dyspnea 07/13/2013  . History of TIA (transient ischemic attack) 07/13/2013  . Edema 07/13/2013  . Aortic atherosclerosis ([place]) 07/13/2013  . Polymyalgia rheumatica ([place]) 07/13/2013    Past Surgical History:  Procedure Laterality Date  . BRAVO Pinch STUDY N/A 04/18/2016   Procedure: BRAVO [place];  Surgeon: Wilford Corner, MD;  Location: [facility];  Service: Endoscopy;  Laterality: N/A;  . COLONOSCOPY    . drropy eyelid surgery    . ESOPHAGOGASTRODUODENOSCOPY (EGD) WITH PROPOFOL N/A 04/18/2016   Procedure: ESOPHAGOGASTRODUODENOSCOPY (EGD) WITH  PROPOFOL;  Surgeon: Wilford Corner, MD;  Location: [facility];  Service: Endoscopy;  Laterality: N/A;  . VASECTOMY          Home Medications    Prior to Admission medications   Medication Sig Start Date End Date Taking? Authorizing Provider  acetaminophen (TYLENOL) 325 MG tablet Take 650 mg by mouth every 6 (six) hours as needed for mild pain.    Yes [provider]  cetirizine (ZYRTEC) 10 MG tablet Take 10 mg by mouth daily as needed. Reported on 12/30/2015   Yes [provider]  clopidogrel (PLAVIX) 75 MG tablet Take 75 mg by mouth daily with breakfast.   Yes  [provider]  doxazosin (CARDURA) 8 MG tablet Take 8 mg by mouth daily.   Yes [provider]  famotidine (PEPCID) 20 MG tablet Take 20 mg by mouth at bedtime.   Yes [provider]  fluticasone (VERAMYST) 27.5 MCG/SPRAY nasal spray Place 2 sprays into the nose daily as needed.    Yes [provider]  furosemide (LASIX) 20 MG tablet Take 40 mg by mouth daily.   Yes [provider]  Multiple Vitamin (MULTI-VITAMINS) TABS Take 1 tablet by mouth daily.   Yes [provider]  omeprazole (PRILOSEC) 20 MG capsule Take 1 capsule (20 mg total) by mouth daily. 04/18/16  Yes Wilford Corner, MD  simvastatin (ZOCOR) 20 MG tablet Take 20 mg by mouth daily.   Yes [provider]    Family History Family History  Problem Relation Age of Onset  . Stroke Mother   . Coronary artery disease Mother   . Diabetes Mother   . Breast cancer Mother   . Heart attack Father   . Coronary artery disease Father   . Diabetes Father   . Graves' disease Sister     Social History Social History   Tobacco Use  . Smoking status: Former Smoker    Packs/day: 1.00    Years: 15.00    Pack years: 15.00    Types: Cigarettes    Last attempt to quit: 07/23/1975    Years since quitting: 42.4  . Smokeless tobacco: Never Used  Substance Use Topics  . Alcohol use: Yes    Alcohol/week: 4.2 oz    Types: 7 Glasses of wine per week    Comment: 1 glass red wine daily  . Drug use: No     Allergies   Patient has no known allergies.   Review of Systems Review of Systems  Constitutional: Negative for fever.  HENT: Positive for congestion (chronic).   Respiratory: Positive for cough and shortness of breath.   Cardiovascular: Positive for leg swelling. Negative for chest pain.  Gastrointestinal: Negative for abdominal pain, blood in stool, nausea and vomiting.  Genitourinary: Negative for dysuria and hematuria.  Skin: Negative for color change.    Neurological: Negative for weakness, numbness and headaches.  All other systems reviewed and are negative.    Physical Exam Updated Vital Signs BP 132/65 (BP Location: Left Arm)   Pulse 74   Temp 97.7 F (36.5 C) (Oral)   Resp 16   Ht 5\' 7"  (1.702 m)   Wt 71 kg (156 lb 8.4 oz)   SpO2 95%   BMI 24.52 kg/m   Physical Exam  Constitutional: He is oriented to person, place, and time. He appears well-developed and well-nourished.  HENT:  Head: Normocephalic and atraumatic.  Mouth/Throat: Oropharynx is clear and moist and mucous membranes are normal.  Eyes: Pupils are equal, round, and reactive to light. Conjunctivae, EOM and lids are normal.  Neck: Full passive range of motion without pain.  Cardiovascular: Normal rate, regular rhythm, normal heart sounds and normal pulses. Exam reveals no gallop and no friction rub.  No murmur heard. Pulses:      Radial pulses are 2+ on the right side, and 2+ on the left side.       Dorsalis pedis pulses are 2+ on the right side, and 2+ on the left side.  2+ pitting edema noted to the proximal tib-fib region that extends distally.  Pulmonary/Chest: Effort normal. He has wheezes in the right lower field and the left lower field.  Able to speak in full sentences without any difficulty.  He does have noted wheezing to the lower lung fields bilaterally.  Some decreased breath sounds noted to the lower lung fields bilaterally.  Abdominal: Soft. Normal appearance. He exhibits no distension. There is no tenderness. There is no rigidity and no guarding.  Abdomen is soft, nondistended.  No increased tympany.  Musculoskeletal: Normal range of motion.  Neurological: He is alert and oriented to person, place, and time.  Skin: Skin is warm and dry. Capillary refill takes less than 2 seconds.  Psychiatric: He has a normal mood and affect. His speech is normal.  Nursing note and vitals reviewed.    ED Treatments / Results  Labs (all labs ordered are  listed, but only abnormal results are displayed) Labs Reviewed  BASIC METABOLIC PANEL - Abnormal; Notable for the following components:      Result Value   Sodium 131 (*)    Chloride 97 (*)    Glucose, Bld 108 (*)    Calcium 8.1 (*)    All other components within normal limits  CBC WITH DIFFERENTIAL/PLATELET - Abnormal; Notable for the following components:   RBC 4.19 (*)    HCT 38.9 (*)    All other components within normal limits  BRAIN NATRIURETIC PEPTIDE  CBC  COMPREHENSIVE METABOLIC PANEL  I-STAT TROPONIN, ED    EKG EKG Interpretation  Date/Time:  Friday Dec 06 2017 13:25:11 EDT Ventricular Rate:  62 PR Interval:    QRS Duration: 90 QT Interval:  415 QTC Calculation: 422 R Axis:   87 Text Interpretation:  Sinus rhythm Borderline right axis deviation Low voltage, extremity and precordial leads Baseline wander in lead(s) V2 V4 V5 no ischemic changes. no change from previous except slower rate. Confirmed by Charlesetta Shanks [phone]) on 12/06/2017 4:39:25 PM   Radiology No results found.  Procedures Procedures (including critical care time)  Medications Ordered in ED Medications  loratadine (CLARITIN) tablet 10 mg (has no administration in time range)  clopidogrel (PLAVIX) tablet 75 mg (has no administration in time range)  doxazosin (CARDURA) tablet 8 mg (has no administration in time range)  famotidine (PEPCID) tablet 20 mg (20 mg Oral Given 12/06/17 2128)  fluticasone (FLONASE) 50 MCG/ACT nasal spray 2 spray (has no administration in time range)  multivitamin with minerals tablet 1 tablet (has no administration in time range)  simvastatin (ZOCOR) tablet 20 mg (has no administration in time range)  enoxaparin (LOVENOX) injection 40 mg (40 mg Subcutaneous Given 12/06/17 2127)  sodium chloride flush (NS) 0.9 % injection 3 mL (3 mLs Intravenous Given 12/06/17 2129)  sodium chloride flush (NS) 0.9 % injection 3 mL (has no administration in time range)  0.9 %  sodium  chloride infusion (has no administration in time range)  acetaminophen (TYLENOL)  tablet 650 mg (has no administration in time range)    Or  acetaminophen (TYLENOL) suppository 650 mg (has no administration in time range)  docusate sodium (COLACE) capsule 100 mg (100 mg Oral Given 12/06/17 2128)  senna (SENOKOT) tablet 8.6 mg (8.6 mg Oral Given 12/06/17 2128)  polyethylene glycol (MIRALAX / GLYCOLAX) packet 17 g (has no administration in time range)  ondansetron (ZOFRAN) tablet 4 mg (has no administration in time range)    Or  ondansetron (ZOFRAN) injection 4 mg (has no administration in time range)  furosemide (LASIX) injection 80 mg (80 mg Intravenous Given 12/06/17 2127)  dextromethorphan-guaiFENesin (MUCINEX DM) 30-600 MG per 12 hr tablet 1 tablet (1 tablet Oral Given 12/06/17 2128)  ipratropium-albuterol (DUONEB) 0.5-2.5 (3) MG/3ML nebulizer solution 3 mL (has no administration in time range)  ipratropium-albuterol (DUONEB) 0.5-2.5 (3) MG/3ML nebulizer solution 3 mL (has no administration in time range)  albuterol (PROVENTIL) (2.5 MG/3ML) 0.083% nebulizer solution 5 mg (5 mg Nebulization Given 12/06/17 1411)  ipratropium-albuterol (DUONEB) 0.5-2.5 (3) MG/3ML nebulizer solution (  Duplicate 3/84/53 6468)     Initial Impression / Assessment and Plan / ED Course  I have reviewed the triage vital signs and the nursing notes.  Pertinent labs & imaging results that were available during my care of the patient were reviewed by me and considered in my medical decision making (see chart for details).     84 year old male with past medical history of dyspnea, GERD, TIA, BPH, pleural effusions, PMR who presents for evaluation of progressive worsening shortness of breath.  He was diagnosed with bilateral pleural effusions via CT.  An echo that showed an EF of 60 to 65%.  Patient reports that he has been working with lower pulmonology to discover the cause of his shortness of breath.  Patient states that  there is no plan in place for his pulmonologist to deal with his pleural effusions.  Patient reports today he had an acute episode where shortness of breath got worse.  He states that over the last several weeks, he has had dyspnea on exertion and more fatigue.  No chest pain.  Patient recently had an increase in his Lasix and is now taking 40 mg of Lasix daily.  Consider pleural effusion related shortness of breath versus CHF versus infectious etiology.  History/physical exam is not concerning for PE.  Low suspicion for ACS etiology but will check EKG troponin.  We will plan to check basic labs.  Will plan to hold off on any repeat imaging since he just had a CT done yesterday.  CT chest shows moderately large bilateral pleural effusions which appear free-flowing.  Patient also has a small pericardial effusion.  ECHO shows an EF of 60 to 65%.  There is a trace pericardial effusion noted.   BNP is unremarkable.  BMP shows sodium of 131, chloride 97.  And was otherwise unremarkable.  Troponin negative.  CBC shows no leukocytosis.  No evidence of hemoglobin.  Patient ambulated with O2 sats of 96 but he became tachypneic and symptomatic.  Given his moderately sized bilateral pleural effusions and progressive worsening symptoms, I feel that admission for possible IR guided thoracentesis would be best.  Discussed with hospitalist regarding patient's need for admission and trouble thoracentesis.  Will admit patient.  Final Clinical Impressions(s) / ED Diagnoses   Final diagnoses:  Pleural effusion  Shortness of breath    ED Discharge Orders    None       Volanda Napoleon,  PA-C 12/06/17 2328    Charlesetta Shanks, MD 12/11/17 [phone]

## 2017-12-06 NOTE — ED Provider Notes (Addendum)
Medical screening examination/treatment/procedure(s) were conducted as a shared visit with non-physician practitioner(s) and myself.  I personally evaluated the patient during the encounter.  EKG Interpretation  Date/Time:  Friday Dec 06 2017 13:25:11 EDT Ventricular Rate:  62 PR Interval:    QRS Duration: 90 QT Interval:  415 QTC Calculation: 422 R Axis:   87 Text Interpretation:  Sinus rhythm Borderline right axis deviation Low voltage, extremity and precordial leads Baseline wander in lead(s) V2 V4 V5 no ischemic changes. no change from previous except slower rate. Confirmed by Charlesetta Shanks [phone]) on 12/06/2017 4:39:25 PM     Charlesetta Shanks, MD 12/06/17 1510 Patient has had increasing shortness of breath.  He has known pleural effusion but unknown etiology.  He has been in the process of a diagnostic evaluation.  No history of thoracentesis diagnostic or palliative.  He is gotten so short of breath at minimal activity is becoming very difficult.  Patient is alert and appropriate.  Mental status clear.  Very soft breath sounds.  Heart regular.  Patient has edema of the lower abdomen and lower legs consistent with anasarca.  I agree with plan and management.   Charlesetta Shanks, MD 12/11/17 [phone]

## 2017-12-06 NOTE — Progress Notes (Signed)
Patient just arrived from ED to 1436. Patient call bell at reach. Pt lungs diminished with wheezing and has a dry cough. Will give report to oncoming nurse. All VS stable.

## 2017-12-06 NOTE — ED Notes (Signed)
ED TO INPATIENT HANDOFF REPORT  Name/Age/Gender Stuart Watson 84 y.o. male  Code Status Code Status History    Date Active Date Inactive Code Status Order ID Comments User Context   11/08/2015 2150 11/10/2015 1318 Full Code [id number]  Elwin Mocha, MD ED      Home/SNF/Other Home  Chief Complaint cough/congestion/weakness  Level of Care/Admitting Diagnosis ED Disposition    ED Disposition Condition [place]: [facility] [[id number]]  Level of Care: Telemetry [5]  Admit to tele based on following criteria: Acute CHF  Diagnosis: Chronic bilateral pleural effusions [2951884]  Admitting Physician: Cristy Folks [[id number]]  Attending Physician: Cristy Folks [[id number]]  PT Class (Do Not Modify): Observation [104]  PT Acc Code (Do Not Modify): Observation [10022]       Medical History Past Medical History:  Diagnosis Date  . Actinic keratoses   . Arthritis   . Colon polyps   . Dyspnea 07/13/2013   BNP normal  . Edema 07/13/2013  . GERD (gastroesophageal reflux disease)    omeprazole 1-2 times daily  . H/O TIA (transient ischemic attack) and stroke    on statins and plavix -saw Dr.Sethi in the past  . History of BPH   . History of TIA (transient ischemic attack) 07/13/2013  . Lumbar radiculopathy   . Need for shingles vaccine   . Optic neuritis 10/2015   left eye  . Perennial allergic rhinitis   . Polymyalgia rheumatica ([place]) 07/13/2013   Daily prednisone  . Restless leg syndrome   . Retinal disease    right eye since birth   . Shingles   . Temporal arteritis ([place]) 10/2015    Allergies No Known Allergies  IV Location/Drains/Wounds Patient Lines/Drains/Airways Status   Active Line/Drains/Airways    Name:   Placement date:   Placement time:   Site:   Days:   Peripheral IV 12/06/17 Right;Distal;Anterior Forearm   12/06/17    1440    Forearm   less than 1          Labs/Imaging Results for orders placed or  performed during the hospital encounter of 12/06/17 (from the past 48 hour(s))  Basic metabolic panel     Status: Abnormal   Collection Time: 12/06/17  2:12 PM  Result Value Ref Range   Sodium 131 (L) 135 - 145 mmol/L   Potassium 4.1 3.5 - 5.1 mmol/L   Chloride 97 (L) 101 - 111 mmol/L   CO2 25 22 - 32 mmol/L   Glucose, Bld 108 (H) 65 - 99 mg/dL   BUN 13 6 - 20 mg/dL   Creatinine, Ser 0.73 0.61 - 1.24 mg/dL   Calcium 8.1 (L) 8.9 - 10.3 mg/dL   GFR calc non Af Amer >60 >60 mL/min   GFR calc Af Amer >60 >60 mL/min    Comment: (NOTE) The eGFR has been calculated using the CKD EPI equation. This calculation has not been validated in all clinical situations. eGFR's persistently <60 mL/min signify possible Chronic Kidney Disease.    Anion gap 9 5 - 15    Comment: Performed at [facility], [place] [address], [place], [place] [address]  CBC with Differential     Status: Abnormal   Collection Time: 12/06/17  2:12 PM  Result Value Ref Range   WBC 6.4 4.0 - 10.5 K/uL   RBC 4.19 (L) 4.22 - 5.81 MIL/uL   Hemoglobin 13.8 13.0 - 17.0 g/dL  HCT 38.9 (L) 39.0 - 52.0 %   MCV 92.8 78.0 - 100.0 fL   MCH 32.9 26.0 - 34.0 pg   MCHC 35.5 30.0 - 36.0 g/dL   RDW 13.5 11.5 - 15.5 %   Platelets 278 150 - 400 K/uL   Neutrophils Relative % 56 %   Neutro Abs 3.6 1.7 - 7.7 K/uL   Lymphocytes Relative 33 %   Lymphs Abs 2.1 0.7 - 4.0 K/uL   Monocytes Relative 7 %   Monocytes Absolute 0.4 0.1 - 1.0 K/uL   Eosinophils Relative 3 %   Eosinophils Absolute 0.2 0.0 - 0.7 K/uL   Basophils Relative 1 %   Basophils Absolute 0.0 0.0 - 0.1 K/uL    Comment: Performed at [facility], [place] [address], [place], [place] [address]  Brain natriuretic peptide     Status: None   Collection Time: 12/06/17  2:12 PM  Result Value Ref Range   B Natriuretic Peptide 45.5 0.0 - 100.0 pg/mL    Comment: Performed at [facility], [place] [address], [place],  [address]   I-Stat Troponin, ED (not at [facility])     Status: None   Collection Time: 12/06/17  2:46 PM  Result Value Ref Range   Troponin i, poc 0.01 0.00 - 0.08 ng/mL   Comment 3            Comment: Due to the release kinetics of cTnI, a negative result within the first hours of the onset of symptoms does not rule out myocardial infarction with certainty. If myocardial infarction is still suspected, repeat the test at appropriate intervals.    No results found.  Pending Labs [employer] (From admission, onward)   Start     Ordered   Signed and Corporate treasurer  Daily,   R     Signed and Held   Signed and Held  CBC  Tomorrow morning,   R     Signed and Held   Signed and Held  Comprehensive metabolic panel  Tomorrow morning,   R     Signed and Held      Vitals/Pain Today's Vitals   12/06/17 1537 12/06/17 1600 12/06/17 1630 12/06/17 1700  BP: (!) 155/64 (!) 144/60 (!) 156/71 (!) 152/65  Pulse:      Resp: (!) '26 17 18 '$ (!) 21  Temp:      TempSrc:      SpO2: 96% 98% 97% 98%  PainSc:        Isolation Precautions No active isolations  Medications Medications  albuterol (PROVENTIL) (2.5 MG/3ML) 0.083% nebulizer solution 5 mg (5 mg Nebulization Given 12/06/17 1411)    Mobility walks with person assist

## 2017-12-06 NOTE — Progress Notes (Signed)
Your echocardiogram shows a ejection fraction of 60 to 65% with grade 1 diastolic dysfunction.  This is similar to the one that you received in 2015 when we performed this test.    After seeing these results as well as with your CT findings, we will probably consider ultrasound-guided thoracentesis in order to drain the fluid as well as get a quality sample.  This can provide symptomatic relief, but can sometimes return.   I see now that your acute visit for today has been canceled near actually in the emergency room at [facility] long.  Please follow-up with [place] after discharge from [facility] emergency room.  We can further discuss these results on 12/13/2017 at your office visit. Please reach out with any concerns or questions.     [name] FNP-C

## 2017-12-06 NOTE — H&P (Signed)
History and Physical    Stuart Watson:[id number] DOB: 1931-10-27 DOA: 12/06/2017  PCP: Wenda Low, MD   Patient coming from: home    Chief Complaint: orthopnea  HPI: Stuart Watson is a 84 y.o. male with medical history significant of TIA, GERD, grade 1 diastolic dysfunction by echo on 12/04/2017 who comes in with increasing shortness of breath and orthopnea.  Patient reports he was doing fairly well until he began to develop increasing cough and shortness of breath approximately 5 weeks ago.  Patient was evaluated by pulmonology who did chest x-rays and imaging and felt that his symptoms and pleural effusion were possibly related to pneumonia.  Patient was trialed on several antibiotics including azithromycin, levofloxacin as well as started on oral diuretics.  He reports that during this time he also began to have worsening lower extremity edema and orthopnea as well as increasing weight gain.  He reports the antibiotics did not help his symptoms at all.  He reports that increasing the diuretics did seem to help his lower extremity edema.  She was seen on 12/04/2017 by pulmonology where a CT scan showed moderate bilateral pleural effusions.  An echocardiogram showed grade 1 diastolic dysfunction with a normal ejection fraction.  It was felt that patient's symptoms should be managed by cardiology as this was felt to be largely secondary to heart failure.  Since then patient reports he has become increasingly orthopneic.  He cannot even lie down flat to sleep.  He wakes up with paroxysmal nocturnal dyspnea.  He reports continued lower extremity edema that is improved somewhat with the diuretics however his respiratory status is not.  He also reports some abdominal distention.  He endorses some weight gain over the last several weeks.  He denies any weight loss, fevers, chills, night sweats, nausea, vomiting, diarrhea, chest pain, syncope/presyncope.  Of note his wife is concerned that he might  have yellow nail syndrome he sees had unusual yellow nails for some time and a dermatologist is felt that this is not secondary to onychomycosis.  ED Course: In the ED patient's vitals were stable.  Labs were unremarkable.  BNP was only 45.5.  Patient was noted to have mild hyponatremia at 131 which is unchanged from prior.  Review of Systems: As per HPI otherwise 10 point review of systems negative.    Past Medical History:  Diagnosis Date  . Actinic keratoses   . Arthritis   . Colon polyps   . Dyspnea 07/13/2013   BNP normal  . Edema 07/13/2013  . GERD (gastroesophageal reflux disease)    omeprazole 1-2 times daily  . H/O TIA (transient ischemic attack) and stroke    on statins and plavix -saw Dr.Sethi in the past  . History of BPH   . History of TIA (transient ischemic attack) 07/13/2013  . Lumbar radiculopathy   . Need for shingles vaccine   . Optic neuritis 10/2015   left eye  . Perennial allergic rhinitis   . Polymyalgia rheumatica ([place]) 07/13/2013   Daily prednisone  . Restless leg syndrome   . Retinal disease    right eye since birth   . Shingles   . Temporal arteritis ([place]) 10/2015    Past Surgical History:  Procedure Laterality Date  . BRAVO [place] STUDY N/A 04/18/2016   Procedure: BRAVO [place];  Surgeon: Wilford Corner, MD;  Location: [facility];  Service: Endoscopy;  Laterality: N/A;  . COLONOSCOPY    . drropy eyelid surgery    .  ESOPHAGOGASTRODUODENOSCOPY (EGD) WITH PROPOFOL N/A 04/18/2016   Procedure: ESOPHAGOGASTRODUODENOSCOPY (EGD) WITH PROPOFOL;  Surgeon: Wilford Corner, MD;  Location: [facility];  Service: Endoscopy;  Laterality: N/A;  . VASECTOMY       reports that he quit smoking about 42 years ago. His smoking use included cigarettes. He has a 15.00 pack-year smoking history. He has never used smokeless tobacco. He reports that he drinks about 4.2 oz of alcohol per week. He reports that he does not use drugs.  No Known Allergies  Family History   Problem Relation Age of Onset  . Stroke Mother   . Coronary artery disease Mother   . Diabetes Mother   . Breast cancer Mother   . Heart attack Father   . Coronary artery disease Father   . Diabetes Father   . Graves' disease Sister      Prior to Admission medications   Medication Sig Start Date End Date Taking? Authorizing Provider  acetaminophen (TYLENOL) 325 MG tablet Take 650 mg by mouth every 6 (six) hours as needed for mild pain.    Yes [provider]  cetirizine (ZYRTEC) 10 MG tablet Take 10 mg by mouth daily as needed. Reported on 12/30/2015   Yes [provider]  clopidogrel (PLAVIX) 75 MG tablet Take 75 mg by mouth daily with breakfast.   Yes [provider]  doxazosin (CARDURA) 8 MG tablet Take 8 mg by mouth daily.   Yes [provider]  famotidine (PEPCID) 20 MG tablet Take 20 mg by mouth at bedtime.   Yes [provider]  fluticasone (VERAMYST) 27.5 MCG/SPRAY nasal spray Place 2 sprays into the nose daily as needed.    Yes [provider]  furosemide (LASIX) 20 MG tablet Take 40 mg by mouth daily.   Yes [provider]  Multiple Vitamin (MULTI-VITAMINS) TABS Take 1 tablet by mouth daily.   Yes [provider]  omeprazole (PRILOSEC) 20 MG capsule Take 1 capsule (20 mg total) by mouth daily. 04/18/16  Yes Wilford Corner, MD  simvastatin (ZOCOR) 20 MG tablet Take 20 mg by mouth daily.   Yes [provider]    Physical Exam: Vitals:   12/06/17 1306 12/06/17 1430 12/06/17 1500 12/06/17 1537  BP: (!) 140/55 126/61 (!) 141/59 (!) 155/64  Pulse: 66     Resp: 16 18 19  (!) 26  Temp: (!) 97.4 F (36.3 C)     TempSrc: Oral     SpO2: 98% 100% 99% 96%    Constitutional: NAD, calm, comfortable Vitals:   12/06/17 1306 12/06/17 1430 12/06/17 1500 12/06/17 1537  BP: (!) 140/55 126/61 (!) 141/59 (!) 155/64  Pulse: 66     Resp: 16 18 19  (!) 26  Temp: (!) 97.4 F (36.3 C)     TempSrc: Oral       SpO2: 98% 100% 99% 96%   Eyes: Anicteric sclera ENMT: Mucous membranes, good dentition Neck: normal, supple Respiratory: No increased work of breathing, diminished lung sounds at bases, audible wheezing at mid lung fields bilaterally, no crackles or rhonchi Cardiovascular: Regular rate and rhythm, no murmurs Abdomen: Mildly distended, soft, nontender, plus bowel sounds, no rebound or guarding Musculoskeletal: 2+ lower extremity edema Skin: Very poor brittle nails, significant amounts of distal yellow discoloration and accretion Neurologic: Fully intact moving all extremities Psychiatric: Normal judgment and insight. Alert and oriented x 3. Normal mood.    Labs on Admission: I have personally reviewed following labs and imaging studies  CBC: Recent  Labs  Lab 12/06/17 1412  WBC 6.4  NEUTROABS 3.6  HGB 13.8  HCT 38.9*  MCV 92.8  PLT 240   Basic Metabolic Panel: Recent Labs  Lab 12/06/17 1412  NA 131*  K 4.1  CL 97*  CO2 25  GLUCOSE 108*  BUN 13  CREATININE 0.73  CALCIUM 8.1*   GFR: Estimated Creatinine Clearance: 65.4 mL/min (by C-G formula based on SCr of 0.73 mg/dL). Liver Function Tests: No results for input(s): AST, ALT, ALKPHOS, BILITOT, PROT, ALBUMIN in the last 168 hours. No results for input(s): LIPASE, AMYLASE in the last 168 hours. No results for input(s): AMMONIA in the last 168 hours. Coagulation Profile: No results for input(s): INR, PROTIME in the last 168 hours. Cardiac Enzymes: No results for input(s): CKTOTAL, CKMB, CKMBINDEX, TROPONINI in the last 168 hours. BNP (last 3 results) Recent Labs    11/13/17 1150  PROBNP 31.0   HbA1C: No results for input(s): HGBA1C in the last 72 hours. CBG: No results for input(s): GLUCAP in the last 168 hours. Lipid Profile: No results for input(s): CHOL, HDL, LDLCALC, TRIG, CHOLHDL, LDLDIRECT in the last 72 hours. Thyroid Function Tests: No results for input(s): TSH, T4TOTAL, FREET4, T3FREE, THYROIDAB in  the last 72 hours. Anemia Panel: No results for input(s): VITAMINB12, FOLATE, FERRITIN, TIBC, IRON, RETICCTPCT in the last 72 hours. Urine analysis: No results found for: COLORURINE, APPEARANCEUR, LABSPEC, PHURINE, GLUCOSEU, HGBUR, BILIRUBINUR, KETONESUR, PROTEINUR, UROBILINOGEN, NITRITE, LEUKOCYTESUR  Radiological Exams on Admission: No results found.  EKG: Independently reviewed.  Sinus rhythm no acute ST segment changes  Assessment/Plan Active Problems:   History of TIA (transient ischemic attack)   Polymyalgia rheumatica (HCC)   Chronic cough   Esophageal reflux   Chronic bilateral pleural effusions    #) Pleural effusions with edema: At this time this is been largely attributed to diastolic heart failure.  Clinically and by exam he certainly appears to be volume overloaded however it is unclear why with such mild diastolic heart failure he is had such significant amounts of pleural effusions and edema that is so resistant to oral diuretics. -We will discuss with interventional radiology about thoracentesis - 2 g sodium restricted, 1.5 L fluid restricted, strict ins and outs, weigh daily -IV furosemide 80 mg twice daily -Hold home oral furosemide -Consider pulmonology consult versus cardiology consult  #) History of TIA: -Continue clopidogrel 75 mg daily -Continue simvastatin 40 mg daily  #) BPH: -Continue doxazosin 8 mg daily  #) Grade 1 diastolic dysfunction: See above  Fluids: Restricted Electro lites: Monitor and supplement Nutrition: 2 g sodium restricted, 1.5 L fluid restricted diet  Prophylaxis: Enoxaparin  Disposition: Pending improved respiratory status  Full code    Cristy Folks MD [facility]   If 7PM-7AM, please contact night-coverage [email or web address] Password [facility]  12/06/2017, 4:37 PM

## 2017-12-06 NOTE — Progress Notes (Signed)
Patient requested something for a cough. PCP was notified. Awaiting any new orders.

## 2017-12-07 ENCOUNTER — Observation Stay (HOSPITAL_COMMUNITY): Payer: Medicare Other

## 2017-12-07 DIAGNOSIS — J90 Pleural effusion, not elsewhere classified: Secondary | ICD-10-CM | POA: Diagnosis not present

## 2017-12-07 DIAGNOSIS — R0609 Other forms of dyspnea: Secondary | ICD-10-CM | POA: Diagnosis not present

## 2017-12-07 DIAGNOSIS — R05 Cough: Secondary | ICD-10-CM | POA: Diagnosis not present

## 2017-12-07 DIAGNOSIS — R846 Abnormal cytological findings in specimens from respiratory organs and thorax: Secondary | ICD-10-CM | POA: Diagnosis not present

## 2017-12-07 DIAGNOSIS — Z8673 Personal history of transient ischemic attack (TIA), and cerebral infarction without residual deficits: Secondary | ICD-10-CM

## 2017-12-07 DIAGNOSIS — R059 Cough, unspecified: Secondary | ICD-10-CM

## 2017-12-07 LAB — GRAM STAIN

## 2017-12-07 LAB — CBC
HCT: 35.6 % — ABNORMAL LOW (ref 39.0–52.0)
Hemoglobin: 12.2 g/dL — ABNORMAL LOW (ref 13.0–17.0)
MCH: 31.7 pg (ref 26.0–34.0)
MCHC: 34.3 g/dL (ref 30.0–36.0)
MCV: 92.5 fL (ref 78.0–100.0)
Platelets: 276 10*3/uL (ref 150–400)
RBC: 3.85 MIL/uL — ABNORMAL LOW (ref 4.22–5.81)
RDW: 13.5 % (ref 11.5–15.5)
WBC: 6.8 K/uL (ref 4.0–10.5)

## 2017-12-07 LAB — COMPREHENSIVE METABOLIC PANEL
ALT: 12 U/L — ABNORMAL LOW (ref 17–63)
AST: 24 U/L (ref 15–41)
Albumin: 2.7 g/dL — ABNORMAL LOW (ref 3.5–5.0)
BUN: 14 mg/dL (ref 6–20)
CO2: 23 mmol/L (ref 22–32)
Chloride: 95 mmol/L — ABNORMAL LOW (ref 101–111)
GFR calc Af Amer: >60 mL/min (ref >60)
GFR calc non Af Amer: >60 mL/min (ref >60)
Glucose, Bld: 108 mg/dL — ABNORMAL HIGH (ref 65–99)
Potassium: 3.9 mmol/L (ref 3.5–5.1)
Sodium: 126 mmol/L — ABNORMAL LOW (ref 135–145)

## 2017-12-07 LAB — LACTATE DEHYDROGENASE, PLEURAL OR PERITONEAL FLUID: LD, Fluid: 74 U/L — ABNORMAL HIGH (ref 3–23)

## 2017-12-07 LAB — EXPECTORATED SPUTUM ASSESSMENT W GRAM STAIN, RFLX TO RESP C: Special Requests: NORMAL

## 2017-12-07 LAB — BODY FLUID CELL COUNT WITH DIFFERENTIAL
EOS FL: 0 %
LYMPHS FL: 57 %
MONOCYTE-MACROPHAGE-SEROUS FLUID: 43 % — AB (ref 50–90)
NEUTROPHIL FLUID: 0 % (ref 0–25)
WBC FLUID: 960 uL (ref 0–1000)

## 2017-12-07 LAB — PROTEIN, PLEURAL OR PERITONEAL FLUID: Total protein, fluid: 3.5 g/dL

## 2017-12-07 LAB — COMPREHENSIVE METABOLIC PANEL WITH GFR
Alkaline Phosphatase: 32 U/L — ABNORMAL LOW (ref 38–126)
Anion gap: 8 (ref 5–15)
Calcium: 7.9 mg/dL — ABNORMAL LOW (ref 8.9–10.3)
Creatinine, Ser: 0.89 mg/dL (ref 0.61–1.24)
Total Bilirubin: 0.6 mg/dL (ref 0.3–1.2)
Total Protein: 5.2 g/dL — ABNORMAL LOW (ref 6.5–8.1)

## 2017-12-07 MED ORDER — LIDOCAINE HCL 1 % IJ SOLN
INTRAMUSCULAR | Status: AC
  Filled 2017-12-07: qty 10

## 2017-12-07 MED ORDER — DM-GUAIFENESIN ER 30-600 MG PO TB12
1.0000 | ORAL_TABLET | Freq: Two times a day (BID) | ORAL | Status: DC
  Administered 2017-12-07 – 2017-12-08 (×3): 1 via ORAL
  Filled 2017-12-07 (×3): qty 1

## 2017-12-07 NOTE — Plan of Care (Signed)
Patient stable during 7 a to 7 p shift, tolerated thoracentesis well.  Patient maintains oxygen levels in the high 90's to 100% on room air even with ambulation.  Ambulated in the hall with wife multiple times during shift.

## 2017-12-07 NOTE — Consult Note (Signed)
PULMONARY / CRITICAL CARE MEDICINE   Name: Stuart Watson MRN: [id number] DOB: [birth year]    ADMISSION DATE:  12/06/2017 CONSULTATION DATE:  12/07/2017  REFERRING MD:  Dr. Wynelle Cleveland  CHIEF COMPLAINT:  Short of breath  HISTORY OF PRESENT ILLNESS:   84 yo male former smoker know to pulmonary service admitted with progressive dyspnea.  CT chest showed large b/l pleural effusions.  He was tried on diuretics, but this caused electrolyte imbalance.  He also had abdominal bloating and leg swelling.    He denies chills, fever, sputum, hemoptysis, chest pain, or weight loss.    PAST MEDICAL HISTORY :  He  has a past medical history of Actinic keratoses, Arthritis, Colon polyps, Dyspnea (07/13/2013), Edema (07/13/2013), GERD (gastroesophageal reflux disease), H/O TIA (transient ischemic attack) and stroke, History of BPH, History of TIA (transient ischemic attack) (07/13/2013), Lumbar radiculopathy, Need for shingles vaccine, Optic neuritis (10/2015), Perennial allergic rhinitis, Polymyalgia rheumatica ([place]) (07/13/2013), Restless leg syndrome, Retinal disease, Shingles, and Temporal arteritis ([place]) (10/2015).  PAST SURGICAL HISTORY: He  has a past surgical history that includes Vasectomy; Colonoscopy; drropy eyelid surgery; Esophagogastroduodenoscopy (egd) with propofol (N/A, 04/18/2016); and BRAVO ph study (N/A, 04/18/2016).  No Known Allergies  No current facility-administered medications on file prior to encounter.    Current Outpatient Medications on File Prior to Encounter  Medication Sig  . acetaminophen (TYLENOL) 325 MG tablet Take 650 mg by mouth every 6 (six) hours as needed for mild pain.   . cetirizine (ZYRTEC) 10 MG tablet Take 10 mg by mouth daily as needed. Reported on 12/30/2015  . clopidogrel (PLAVIX) 75 MG tablet Take 75 mg by mouth daily with breakfast.  . doxazosin (CARDURA) 8 MG tablet Take 8 mg by mouth daily.  . famotidine (PEPCID) 20 MG tablet Take 20 mg by mouth at bedtime.  .  fluticasone (VERAMYST) 27.5 MCG/SPRAY nasal spray Place 2 sprays into the nose daily as needed.   . furosemide (LASIX) 20 MG tablet Take 40 mg by mouth daily.  . Multiple Vitamin (MULTI-VITAMINS) TABS Take 1 tablet by mouth daily.  Marland Kitchen omeprazole (PRILOSEC) 20 MG capsule Take 1 capsule (20 mg total) by mouth daily.  . simvastatin (ZOCOR) 20 MG tablet Take 20 mg by mouth daily.    FAMILY HISTORY:  His indicated that his mother is deceased. He indicated that his father is deceased. He indicated that his sister is alive.   SOCIAL HISTORY: He  reports that he quit smoking about 42 years ago. His smoking use included cigarettes. He has a 15.00 pack-year smoking history. He has never used smokeless tobacco. He reports that he drinks about 4.2 oz of alcohol per week. He reports that he does not use drugs.  REVIEW OF SYSTEMS:   12 point ROS negative except above  SUBJECTIVE:   VITAL SIGNS: BP 140/60 (BP Location: Left Arm)   Pulse 75   Temp 98.1 F (36.7 C) (Oral)   Resp 19   Ht 5\' 7"  (1.702 m)   Wt 148 lb 13 oz (67.5 kg)   SpO2 95%   BMI 23.31 kg/m   INTAKE / OUTPUT: I/O last 3 completed shifts: In: 240 [P.O.:240] Out: 1505 [Urine:1505]  PHYSICAL EXAMINATION:  General - pleasant Eyes - pupils reactive ENT - no sinus tenderness, no oral exudate, no LAN Cardiac - regular, no murmur Chest - basilar rales Abd - soft, non tender Ext - no edema Skin - no rashes Neuro - normal strength Psych - normal mood  LABS:  BMET Recent Labs  Lab 12/06/17 1412 12/07/17 0400  NA 131* 126*  K 4.1 3.9  CL 97* 95*  CO2 25 23  BUN 13 14  CREATININE 0.73 0.89  GLUCOSE 108* 108*    Electrolytes Recent Labs  Lab 12/06/17 1412 12/07/17 0400  CALCIUM 8.1* 7.9*    CBC Recent Labs  Lab 12/06/17 1412 12/07/17 0400  WBC 6.4 6.8  HGB 13.8 12.2*  HCT 38.9* 35.6*  PLT 278 276    Coag's No results for input(s): APTT, INR in the last 168 hours.  Sepsis Markers No results  for input(s): LATICACIDVEN, PROCALCITON, O2SATVEN in the last 168 hours.  ABG No results for input(s): PHART, PCO2ART, PO2ART in the last 168 hours.  Liver Enzymes Recent Labs  Lab 12/07/17 0400  AST 24  ALT 12*  ALKPHOS 32*  BILITOT 0.6  ALBUMIN 2.7*    Cardiac Enzymes No results for input(s): TROPONINI, PROBNP in the last 168 hours.  Glucose No results for input(s): GLUCAP in the last 168 hours.  Imaging No results found.   STUDIES:  Echo 12/04/17 >> EF 60 to 65%, grade 1 DD, PAS 35 mmHg CT chest 12/05/17 >> large b/l pleural effusions  CULTURES:  ANTIBIOTICS:  SIGNIFICANT EVENTS: 5/17 Admit  DISCUSSION: 84 yo male with dyspnea, cough with large b/l pleural effusions.  He has prior hx of aspiration pneumonia and temporal arteritis.  ASSESSMENT / PLAN:  Pleural effusions. - IR consult to perform thoracentesis - will send fluid for analysis  Hyponatremia. - f/u BMET  D/w Dr. Jiles Harold, MD [place] 12/07/2017, 2:46 PM

## 2017-12-07 NOTE — Procedures (Signed)
Ultrasound-guided diagnostic and therapeutic right thoracentesis performed yielding 1.8 liters of yellow fluid. No immediate complications. Follow-up chest x-ray pending.The fluid was sent to the lab for preordered studies. Moderate left pleural effusion noted .

## 2017-12-07 NOTE — Progress Notes (Signed)
PROGRESS NOTE    Stuart Watson   YTK:[id number]  DOB: Oct 18, 1931  DOA: 12/06/2017 PCP: Wenda Low, MD   Brief Narrative:  Stuart Watson is a 84 y.o. male with medical history significant of TIA, GERD, grade 1 diastolic dysfunction by echo on 12/04/2017 who comes in with increasing shortness of breath, cough and orthopnea.   He was being managed for dyspnea and cough by [facility] for the past few weeks. About 2 wks ago he was prescribed  a Z pak and a few days after this finished, a 7 day course of Levaquin. His Lasix was doubled from 20 - 40 mg daily as well but he has only gained more weight.  She was seen on 12/04/2017 by pulmonology where a CT scan showed moderate bilateral pleural effusions. 2 D ECHO was ordered with results mentioned above. In the ED, he was started on 80 mg IV Lasix BID. BNP only 45.5.   Subjective: He told the RN that he is breathing better than yesterday but to me he states he is about the same. His cough is now productive but he is not sure of the color of the sputum. He states he has had a cough for about 5 years related to his PND.    Assessment & Plan:   Principal Problem:   DOE (dyspnea on exertion)/ acute on chronic cough/ acute on chronic pleural effusions - has had small effusions but these were noted to be worse on the CT on 5/15 - have requested a thoracentesis and lab work - check sputum culture -  ambulatory pulse ox- 98- 100% on exertion - d/c Lasix- his symptoms are not improving, BNP is normal and ECHO only shows grade 1 d CHF - have consulted pulmonary, dr [name]  Active Problems:  PND - Fluticasone at home- continue   Pedal edema  - he states he has had this for about 5 yrs and wears support stockings at home- will place TEDS and elevated- importance of proper elevation discussed  Hyponatremia - possibly due to Lasix- sodium 131 on admission dropping to 126 after 80 mg of IV lasix - follow   Chronic anemia - stable   History of TIA (transient ischemic attack) - on Plavix and Zocor    Esophageal reflux - Pepcid    DVT prophylaxis: change Lovenox to SCDs while in bed- ambulating well in hall- encourade ambulation Code Status: Full code Family Communication: daughter Disposition Plan: f/u on thoracentesis Consultants:   Pulmonary  IR Procedures:    Antimicrobials:  Anti-infectives (From admission, onward)   None       Objective: Vitals:   12/06/17 1900 12/06/17 2026 12/06/17 2139 12/07/17 0455  BP:   132/65 140/60  Pulse:   74 75  Resp:   16 19  Temp:   97.7 F (36.5 C) 98.1 F (36.7 C)  TempSrc:   Oral Oral  SpO2:  98% 95% 95%  Weight:    67.5 kg (148 lb 13 oz)  Height: 5\' 7"  (1.702 m)       Intake/Output Summary (Last 24 hours) at 12/07/2017 1220 Last data filed at 12/07/2017 0900 Gross per 24 hour  Intake 480 ml  Output 1505 ml  Net -1025 ml   Filed Weights   12/06/17 1859 12/07/17 0455  Weight: 71 kg (156 lb 8.4 oz) 67.5 kg (148 lb 13 oz)    Examination: General exam: Appears comfortable  HEENT: PERRLA, oral mucosa moist, no sclera icterus or thrush  Respiratory system:  Crackles in RLL- good air entry, congested cough is frequent Cardiovascular system: S1 & S2 heard, RRR.   Gastrointestinal system: Abdomen soft, non-tender, nondistended. Normal bowel sound. No organomegaly Central nervous system: Alert and oriented. No focal neurological deficits. Extremities: No cyanosis, clubbing - 2 + pitting edema Skin: No rashes or ulcers Psychiatry:  Mood & affect appropriate.     Data Reviewed: I have personally reviewed following labs and imaging studies  CBC: Recent Labs  Lab 12/06/17 1412 12/07/17 0400  WBC 6.4 6.8  NEUTROABS 3.6  --   HGB 13.8 12.2*  HCT 38.9* 35.6*  MCV 92.8 92.5  PLT 278 443   Basic Metabolic Panel: Recent Labs  Lab 12/06/17 1412 12/07/17 0400  NA 131* 126*  K 4.1 3.9  CL 97* 95*  CO2 25 23  GLUCOSE 108* 108*  BUN 13 14    CREATININE 0.73 0.89  CALCIUM 8.1* 7.9*   GFR: Estimated Creatinine Clearance: 58.8 mL/min (by C-G formula based on SCr of 0.89 mg/dL). Liver Function Tests: Recent Labs  Lab 12/07/17 0400  AST 24  ALT 12*  ALKPHOS 32*  BILITOT 0.6  PROT 5.2*  ALBUMIN 2.7*   No results for input(s): LIPASE, AMYLASE in the last 168 hours. No results for input(s): AMMONIA in the last 168 hours. Coagulation Profile: No results for input(s): INR, PROTIME in the last 168 hours. Cardiac Enzymes: No results for input(s): CKTOTAL, CKMB, CKMBINDEX, TROPONINI in the last 168 hours. BNP (last 3 results) Recent Labs    11/13/17 1150  PROBNP 31.0   HbA1C: No results for input(s): HGBA1C in the last 72 hours. CBG: No results for input(s): GLUCAP in the last 168 hours. Lipid Profile: No results for input(s): CHOL, HDL, LDLCALC, TRIG, CHOLHDL, LDLDIRECT in the last 72 hours. Thyroid Function Tests: No results for input(s): TSH, T4TOTAL, FREET4, T3FREE, THYROIDAB in the last 72 hours. Anemia Panel: No results for input(s): VITAMINB12, FOLATE, FERRITIN, TIBC, IRON, RETICCTPCT in the last 72 hours. Urine analysis: No results found for: COLORURINE, APPEARANCEUR, LABSPEC, PHURINE, GLUCOSEU, HGBUR, BILIRUBINUR, KETONESUR, PROTEINUR, UROBILINOGEN, NITRITE, LEUKOCYTESUR Sepsis Labs: @LABRCNTIP (procalcitonin:4,lacticidven:4) )No results found for this or any previous visit (from the past 240 hour(s)).       Radiology Studies: No results found.    Scheduled Meds: . clopidogrel  75 mg Oral Q breakfast  . dextromethorphan-guaiFENesin  1 tablet Oral BID  . docusate sodium  100 mg Oral BID  . doxazosin  8 mg Oral Daily  . enoxaparin (LOVENOX) injection  40 mg Subcutaneous Q24H  . famotidine  20 mg Oral QHS  . fluticasone  2 spray Each Nare Daily  . loratadine  10 mg Oral Daily  . multivitamin with minerals  1 tablet Oral Daily  . senna  1 tablet Oral BID  . simvastatin  20 mg Oral Daily  . sodium  chloride flush  3 mL Intravenous Q12H   Continuous Infusions: . sodium chloride       LOS: 0 days    Time spent in minutes: 35    Debbe Odea, MD [facility] Pager: [email or web address] Password [email or web address] 12/07/2017, 12:20 PM

## 2017-12-07 NOTE — Progress Notes (Signed)
Patient ambulatory approximately 1/2 of unit, O2 sats maintained 98-100% on room air.  Patient did feel moderately dyspneic with exertion.

## 2017-12-08 ENCOUNTER — Observation Stay (HOSPITAL_COMMUNITY): Payer: Medicare Other

## 2017-12-08 DIAGNOSIS — K219 Gastro-esophageal reflux disease without esophagitis: Secondary | ICD-10-CM

## 2017-12-08 DIAGNOSIS — R918 Other nonspecific abnormal finding of lung field: Secondary | ICD-10-CM | POA: Diagnosis not present

## 2017-12-08 DIAGNOSIS — Z9889 Other specified postprocedural states: Secondary | ICD-10-CM

## 2017-12-08 DIAGNOSIS — R600 Localized edema: Secondary | ICD-10-CM | POA: Diagnosis not present

## 2017-12-08 DIAGNOSIS — J90 Pleural effusion, not elsewhere classified: Secondary | ICD-10-CM | POA: Diagnosis not present

## 2017-12-08 DIAGNOSIS — L605 Yellow nail syndrome: Secondary | ICD-10-CM

## 2017-12-08 DIAGNOSIS — R0609 Other forms of dyspnea: Secondary | ICD-10-CM | POA: Diagnosis not present

## 2017-12-08 DIAGNOSIS — Z8673 Personal history of transient ischemic attack (TIA), and cerebral infarction without residual deficits: Secondary | ICD-10-CM | POA: Diagnosis not present

## 2017-12-08 DIAGNOSIS — R05 Cough: Secondary | ICD-10-CM | POA: Diagnosis not present

## 2017-12-08 LAB — BASIC METABOLIC PANEL
ANION GAP: 7 (ref 5–15)
Anion gap: 7 (ref 5–15)
BUN: 14 mg/dL (ref 6–20)
CHLORIDE: 95 mmol/L — AB (ref 101–111)
CO2: 25 mmol/L (ref 22–32)
CREATININE: 0.86 mg/dL (ref 0.61–1.24)
Calcium: 7.8 mg/dL — ABNORMAL LOW (ref 8.9–10.3)
Calcium: 7.9 mg/dL — ABNORMAL LOW (ref 8.9–10.3)
GFR calc Af Amer: >60 mL/min (ref >60)
GFR calc non Af Amer: >60 mL/min (ref >60)
GFR calc non Af Amer: >60 mL/min (ref >60)
Glucose, Bld: 101 mg/dL — ABNORMAL HIGH (ref 65–99)
POTASSIUM: 4.5 mmol/L (ref 3.5–5.1)
Potassium: 3.9 mmol/L (ref 3.5–5.1)
SODIUM: 127 mmol/L — AB (ref 135–145)
Sodium: 126 mmol/L — ABNORMAL LOW (ref 135–145)

## 2017-12-08 LAB — BASIC METABOLIC PANEL WITH GFR
BUN: 14 mg/dL (ref 6–20)
CO2: 23 mmol/L (ref 22–32)
Chloride: 96 mmol/L — ABNORMAL LOW (ref 101–111)
Creatinine, Ser: 0.76 mg/dL (ref 0.61–1.24)
Glucose, Bld: 96 mg/dL (ref 65–99)

## 2017-12-08 MED ORDER — DM-GUAIFENESIN ER 30-600 MG PO TB12: 1.0000 | ORAL_TABLET | Freq: Two times a day (BID) | ORAL | 0 refills | Status: DC

## 2017-12-08 MED ORDER — SODIUM CHLORIDE 0.9 % IV BOLUS
1000.0000 mL | Freq: Once | INTRAVENOUS | Status: AC
  Administered 2017-12-08: 1000 mL via INTRAVENOUS

## 2017-12-08 MED ORDER — POLYETHYLENE GLYCOL 3350 17 G PO PACK: 17.0000 g | PACK | Freq: Every day | ORAL | 0 refills | Status: DC | PRN

## 2017-12-08 MED ORDER — LIDOCAINE HCL 1 % IJ SOLN
INTRAMUSCULAR | Status: AC
  Filled 2017-12-08: qty 20

## 2017-12-08 MED ORDER — SODIUM CHLORIDE 1 G PO TABS: 1.0000 g | ORAL_TABLET | Freq: Three times a day (TID) | ORAL | Status: DC

## 2017-12-08 NOTE — Procedures (Signed)
Ultrasound-guided  therapeutic left thoracentesis performed yielding 1.7 liters of yellow fluid. No immediate complications. Follow-up chest x-ray pending.

## 2017-12-08 NOTE — Discharge Instructions (Signed)
Please stop taking Lasix Cont to wear support hose for legs.  You have a f/u for Pulmonary on 12/13/17 on 1:45. They will have some results back from the pleural fluid and sputum culture by then.  Talk to your doctor or go to a tertiary care hospital to investigate yellow nail syndrome.  You were cared for by a hospitalist during your hospital stay. If you have any questions about your discharge medications or the care you received while you were in the hospital after you are discharged, you can call the unit and asked to speak with the hospitalist on call if the hospitalist that took care of you is not available. Once you are discharged, your primary care physician will handle any further medical issues. Please note that NO REFILLS for any discharge medications will be authorized once you are discharged, as it is imperative that you return to your primary care physician (or establish a relationship with a primary care physician if you do not have one) for your aftercare needs so that they can reassess your need for medications and monitor your lab values.  Please take all your medications with you for your next visit with your Primary MD. Please ask your Primary MD to get all Hospital records sent to his/her office. Please request your Primary MD to go over all hospital test results at the follow up.    If you experience worsening of your admission symptoms, develop shortness of breath, chest pain, suicidal or homicidal thoughts or a life threatening emergency, you must seek medical attention immediately by calling 911 or calling your MD.   Dennis Bast must read the complete instructions/literature along with all the possible adverse reactions/side effects for all the medicines you take including new medications that have been prescribed to you. Take new medicines after you have completely understood and accpet all the possible adverse reactions/side effects.    Do not drive when taking pain medications or  sedatives.     Do not take more than prescribed Pain, Sleep and Anxiety Medications   If you have smoked or chewed Tobacco in the last 2 yrs please stop. Stop any regular alcohol  and or recreational drug use.   Wear Seat belts while driving.

## 2017-12-08 NOTE — Plan of Care (Signed)
Discharge instructions reviewed with patient and wife, questions answered, verbalized understanding.  Patient ambulatory accompanied by wife and RN to main entrance of hospital to be taken home by wife.  All belongings in patients possession at time of discharge.

## 2017-12-08 NOTE — Discharge Summary (Signed)
Physician Discharge Summary  SHOOTER TANGEN SWN:[id number] DOB: 1932/02/14 DOA: 12/06/2017  PCP: Wenda Low, MD  Admit date: 12/06/2017 Discharge date: 12/08/2017  Admitted From: home Disposition:  home   Recommendations for Outpatient Follow-up:  1. [facility] to please f/u on pleural fluid and sputum culture results  2. Please r/o yellow nail syndrome  Home Health:  none  Equipment/Devices:  none    Discharge Condition:  stable   CODE STATUS:  Full code   Consultations:  Pulmonary     Discharge Diagnoses:  Principal Problem:   Bilateral pleural effusion Active Problems:   Cough in adult   Pedal edema- chronic   Yellow nails   History of TIA (transient ischemic attack)   Esophageal reflux      Brief Summary: Stuart Watson is a 84 y.o.malewith medical history significant ofTIA, GERD, grade 1 diastolic dysfunction by echo on 12/04/2017 who comes in with increasing shortness of breath, cough and orthopnea.  He was being managed for dyspnea and cough by [facility] for the past few weeks. About 2 wks ago he was prescribed  a Z pak and a few days after this finished, a 7 day course of Levaquin. His Lasix was doubled from 20 - 40 mg daily as well but he has only gained more weight.  She was seen on 12/04/2017 by pulmonology where a CT scan showed moderate bilateral pleural effusions. 2 D ECHO was ordered with results mentioned above. In the ED, he was started on 80 mg IV Lasix BID. BNP only 45.5.   Hospital Course:  Principal Problem:   DOE (dyspnea on exertion)/ acute on chronic cough/ acute on chronic pleural effusions - has had small effusions but these were noted to be worse on the CT on 5/15 - have requested a thoracentesis and lab work - check sputum culture -  ambulatory pulse ox- 98- 100% on exertion - d/c Lasix- his symptoms are not improving, BNP is normal and ECHO only shows grade 1 d CHF- Lasix stopped - Thoracentesis on right done on 5/18-  1.7 L removed- fluid consistent with transudate- 960 WBC with 57% lymphs- gram stain does not show any organisms - interestingly congested cough has resolved since thoracentesis - today, left sided thoracentesis done- 1.7 L again removed - have consulted pulmonary, Dr [name]- his office will follow up on results of thoracentesis and sputum culture- the patient has a f/u appt  Active Problems:  Yellow nails - has broken, fragile yellow colored nails - has been told by 2 dermatologists that it his not a fungal infection  Pedal edema  - he states he has had this for about 5 yrs and wears support stockings at home- will place TEDS and elevated- importance of proper elevation discussed  PND/ chronic sinusitis - prior MRI head/neck in 4/17  Showed (among other findings) > Mucoperiosteal thickening throughout the right maxillary sinus and small volume of fluid present throughout the bilateral maxillary sinuses and multiple ethmoid air cells.  - Fluticasone at home- continue   ? Yellow nail syndrome - it would be worth looking into by his PCP as he has symptoms including chronic pedal edema, b/l pleural effusion, chronic sinusitis and yellow nails which are not a fungal infection  Hyponatremia - possibly due to Lasix- sodium 131 on admission dropping to 126 after 80 mg of IV lasix - given 1 L NS to help bring this up- he is asymptomatic and has been actively ambulating in the halls  Chronic anemia - stable    History of TIA (transient ischemic attack) - on Plavix and Zocor    Esophageal reflux - Pepcid  Left Ischemic optic neuropathy - follows with ophthalmology   Discharge Exam: Vitals:   12/08/17 1157 12/08/17 1455  BP: (!) 122/43 (!) 111/53  Pulse:  65  Resp:  18  Temp:  98.2 F (36.8 C)  SpO2: 100% 97%   Vitals:   12/08/17 1147 12/08/17 1153 12/08/17 1157 12/08/17 1455  BP: (!) 135/47 (!) 122/49 (!) 122/43 (!) 111/53  Pulse:    65  Resp:    18  Temp:    98.2 F  (36.8 C)  TempSrc:    Oral  SpO2: 98% 99% 100% 97%  Weight:      Height:        General: Pt is alert, awake, not in acute distress Cardiovascular: RRR, S1/S2 +, no rubs, no gallops Respiratory: CTA bilaterally, no wheezing, no rhonchi Abdominal: Soft, NT, ND, bowel sounds + Extremities: 2 + pitting edema, no cyanosis   Discharge Instructions  Discharge Instructions    Diet general   Complete by:  As directed    Low fat diet   Increase activity slowly   Complete by:  As directed      Allergies as of 12/08/2017   No Known Allergies     Medication List    STOP taking these medications   furosemide 20 MG tablet Commonly known as:  LASIX     TAKE these medications   acetaminophen 325 MG tablet Commonly known as:  TYLENOL Take 650 mg by mouth every 6 (six) hours as needed for mild pain.   cetirizine 10 MG tablet Commonly known as:  ZYRTEC Take 10 mg by mouth daily as needed. Reported on 12/30/2015   clopidogrel 75 MG tablet Commonly known as:  PLAVIX Take 75 mg by mouth daily with breakfast.   dextromethorphan-guaiFENesin 30-600 MG 12hr tablet Commonly known as:  MUCINEX DM Take 1 tablet by mouth 2 (two) times daily.   doxazosin 8 MG tablet Commonly known as:  CARDURA Take 8 mg by mouth daily.   famotidine 20 MG tablet Commonly known as:  PEPCID Take 20 mg by mouth at bedtime.   fluticasone 27.5 MCG/SPRAY nasal spray Commonly known as:  VERAMYST Place 2 sprays into the nose daily as needed.   MULTI-VITAMINS Tabs Take 1 tablet by mouth daily.   omeprazole 20 MG capsule Commonly known as:  PRILOSEC Take 1 capsule (20 mg total) by mouth daily.   polyethylene glycol packet Commonly known as:  MIRALAX / GLYCOLAX Take 17 g by mouth daily as needed for mild constipation.   simvastatin 20 MG tablet Commonly known as:  ZOCOR Take 20 mg by mouth daily.       No Known Allergies   Procedures/Studies:  Bilateral Thoracentesis   Dg Chest 1  View  Result Date: 12/08/2017 CLINICAL DATA:  Thoracentesis EXAM: CHEST  1 VIEW COMPARISON:  Yesterday FINDINGS: No significant residual pleural fluid. No evidence of pneumothorax or re-expansion edema. Mild atelectatic type opacities at the bases. Normal heart size and mediastinal contours. IMPRESSION: No acute finding after thoracentesis.  No visible residual fluid. Electronically Signed   By: Monte Fantasia M.D.   On: 12/08/2017 12:42   Dg Chest 1 View  Result Date: 12/07/2017 CLINICAL DATA:  Patient status post right thoracentesis. EXAM: CHEST  1 VIEW COMPARISON:  Chest radiograph 11/29/2017 FINDINGS: Monitoring leads overlie the patient. Stable  cardiomegaly. Interval resolution of right pleural effusion. Persistent moderate left pleural effusion. Heterogeneous opacities left lung base. No pneumothorax. Thoracic spine degenerative changes. IMPRESSION: Interval resolution right pleural effusion. Persistent moderate left pleural effusion with underlying atelectasis. Electronically Signed   By: Lovey Newcomer M.D.   On: 12/07/2017 15:52   Dg Chest 2 View  Result Date: 11/29/2017 CLINICAL DATA:  Shortness of breath and weakness. EXAM: CHEST - 2 VIEW COMPARISON:  11/22/2017 FINDINGS: Normal heart size. Mild aortic atherosclerosis. There is persistent bilateral pleural effusions, left greater than right. Streaky areas of atelectasis within both lung bases again noted. Spondylosis noted within the thoracic spine. IMPRESSION: 1. No change in bilateral pleural effusions, left greater than right. 2.  Aortic Atherosclerosis (ICD10-I70.0). Electronically Signed   By: Kerby Moors M.D.   On: 11/29/2017 09:40   Dg Chest 2 View  Result Date: 11/22/2017 CLINICAL DATA:  History of pneumonia and effusions, cough, follow-up, former smoking history EXAM: CHEST - 2 VIEW COMPARISON:  Chest x-ray of 11/13/2017 FINDINGS: There is little change to perhaps minimal improvement in small bilateral pleural effusions and basilar  atelectasis left-greater-than-right. Pneumonia at the left lung base is a definite consideration. Mediastinal and hilar contours are stable and heart size is stable. There are mild degenerative changes in the thoracic spine. IMPRESSION: Little change to minimal improvement in bibasilar opacities consistent with effusion, atelectasis, and possible left basilar pneumonia. Electronically Signed   By: Ivar Drape M.D.   On: 11/22/2017 09:48   Dg Chest 2 View  Result Date: 11/13/2017 CLINICAL DATA:  Dyspnea on exertion, worsening for 6 weeks EXAM: CHEST - 2 VIEW COMPARISON:  02/22/2016 FINDINGS: Small pleural effusions and streaky lower lobe opacity. No generalized Kerley lines, air bronchogram, or pneumothorax. Normal heart size and mediastinal contours. IMPRESSION: Small pleural effusions and lower lobe opacity favoring atelectasis. Electronically Signed   By: Monte Fantasia M.D.   On: 11/13/2017 12:26   Ct Chest W Contrast  Result Date: 12/05/2017 CLINICAL DATA:  Short of breath, coughing, wheezing for 3-4 weeks EXAM: CT CHEST WITH CONTRAST TECHNIQUE: Multidetector CT imaging of the chest was performed during intravenous contrast administration. CONTRAST:  11mL ISOVUE-300 IOPAMIDOL (ISOVUE-300) INJECTION 61% COMPARISON:  Chest x-ray of 11/29/2016 and 11/13/2016 FINDINGS: Cardiovascular: There is good opacification of the thoracic aorta and the pulmonary arteries. No acute abnormality is seen. Moderate thoracic aortic atherosclerosis is present. There are diffuse coronary artery calcifications. The heart is within normal limits in size and there is a small pericardial effusion noted. Mediastinum/Nodes: No mediastinal or hilar adenopathy seen with only small mediastinal lymph nodes present. The thyroid gland is unremarkable. No definite hiatal hernia is seen. Lungs/Pleura: There are moderately large bilateral pleural effusions present which appear free-flowing. As result of these effusions, there is partial  atelectasis of both lower lobes. No definite pneumonia is seen. A 7 mm pleural-based nodule is present in the right lower lobe laterally on image 100 series 3. A 6 mm noncalcified subpleural nodule is present in the right middle lobe anterolaterally on image 128. A small calcified granuloma is present within the right middle lobe on image 99. No additional lung nodule is seen. These nodules most likely are postinflammatory possibly due to prior granulomatous disease. The central airway is patent. Upper Abdomen: No abnormality of the upper abdomen is noted on the few images obtained other than probable diffuse fluid overload with strandiness of the soft tissues diffusely. Musculoskeletal: The thoracic vertebrae are in normal alignment with diffuse degenerative  change. No compression deformity is seen. IMPRESSION: 1. Moderately large bilateral pleural effusions which appear free-flowing, resulting in partial atelectasis of both lower lobes. No definite pneumonia. 2. Small noncalcified lung nodules with a calcified granuloma in the right middle lobe. These changes most likely are postinflammatory or post infectious in origin. 3. Moderate thoracic aortic atherosclerosis and diffuse coronary artery calcifications. 4. Small pericardial effusion. Electronically Signed   By: Ivar Drape M.D.   On: 12/05/2017 08:47   US Thoracentesis Asp Pleural Space W/img Guide  Result Date: 12/08/2017 INDICATION: Patient with history of CHF, dyspnea, bilateral pleural effusions, status post right thoracentesis on 12/07/2017. Request now received for therapeutic left thoracentesis. EXAM: ULTRASOUND GUIDED THERAPEUTIC LEFT THORACENTESIS MEDICATIONS: None COMPLICATIONS: None immediate. PROCEDURE: An ultrasound guided thoracentesis was thoroughly discussed with the patient and questions answered. The benefits, risks, alternatives and complications were also discussed. The patient understands and wishes to proceed with the procedure.  Written consent was obtained. Ultrasound was performed to localize and mark an adequate pocket of fluid in the left chest. The area was then prepped and draped in the normal sterile fashion. 1% Lidocaine was used for local anesthesia. Under ultrasound guidance a 6 Fr Safe-T-Centesis catheter was introduced. Thoracentesis was performed. The catheter was removed and a dressing applied. FINDINGS: A total of approximately 1.7 liters of yellow fluid was removed. IMPRESSION: Successful ultrasound guided therapeutic left thoracentesis yielding 1.7 liters of pleural fluid. Read by: Rowe Robert, PA-C No pneumothorax on follow-up radiograph. Electronically Signed   By: Lucrezia Europe M.D.   On: 12/08/2017 12:13   US Thoracentesis Asp Pleural Space W/img Guide  Result Date: 12/07/2017 INDICATION: Patient with history of CHF, dyspnea, bilateral pleural effusions, right greater than left. Request made for diagnostic and therapeutic right thoracentesis. EXAM: ULTRASOUND GUIDED DIAGNOSTIC AND THERAPEUTIC RIGHT THORACENTESIS MEDICATIONS: None COMPLICATIONS: None immediate. PROCEDURE: An ultrasound guided thoracentesis was thoroughly discussed with the patient and questions answered. The benefits, risks, alternatives and complications were also discussed. The patient understands and wishes to proceed with the procedure. Written consent was obtained. Ultrasound was performed to localize and mark an adequate pocket of fluid in the right chest. The area was then prepped and draped in the normal sterile fashion. 1% Lidocaine was used for local anesthesia. Under ultrasound guidance a 6 Fr Safe-T-Centesis catheter was introduced. Thoracentesis was performed. The catheter was removed and a dressing applied. FINDINGS: A total of approximately 1.8 liters of yellow fluid was removed. Samples were sent to the laboratory as requested by the clinical team. IMPRESSION: Successful ultrasound guided diagnostic and therapeutic right thoracentesis  yielding 1.8 liters of pleural fluid. Read by: Rowe Robert, PA-C Electronically Signed   By: Lucrezia Europe M.D.   On: 12/07/2017 15:33     The results of significant diagnostics from this hospitalization (including imaging, microbiology, ancillary and laboratory) are listed below for reference.     Microbiology: Recent Results (from the past 240 hour(s))  Culture, expectorated sputum-assessment     Status: None   Collection Time: 12/07/17 12:34 PM  Result Value Ref Range Status   Specimen Description SPUTUM  Final   Special Requests Normal  Final   Sputum evaluation   Final    THIS SPECIMEN IS ACCEPTABLE FOR SPUTUM CULTURE Performed at [facility], [place] [address], [place], [place] [address]    Report Status 12/07/2017 FINAL  Final  Culture, respiratory (NON-Expectorated)     Status: None (Preliminary result)   Collection Time: 12/07/17 12:34 PM  Result Value Ref Range Status   Specimen Description   Final    SPUTUM Performed at [facility] [address] [place], [place] [address]    Special Requests   Final    Normal Reflexed from [id number] Performed at [facility], [place] [address], [place], [place] [address]    Gram Stain   Final    ABUNDANT WBC PRESENT, PREDOMINANTLY PMN RARE SQUAMOUS EPITHELIAL CELLS PRESENT FEW GRAM POSITIVE COCCI IN CHAINS    Culture   Final    TOO YOUNG TO READ Performed at [facility], [place] [address] [place], [place] [address]    Report Status PENDING  Incomplete  Culture, body fluid-bottle     Status: None (Preliminary result)   Collection Time: 12/07/17  3:05 PM  Result Value Ref Range Status   Specimen Description PLEURAL RIGHT  Final   Special Requests NONE  Final   Culture   Final    NO GROWTH < 24 HOURS Performed at [facility], [place] [address], [place], [place] [address]    Report Status PENDING  Incomplete  Gram stain     Status: None   Collection Time: 12/07/17   3:05 PM  Result Value Ref Range Status   Specimen Description PLEURAL RIGHT  Final   Special Requests NONE  Final   Gram Stain   Final    WBC PRESENT,BOTH PMN AND MONONUCLEAR NO ORGANISMS SEEN CYTOSPIN SMEAR Performed at [facility], [address], [place], [place] [address]    Report Status 12/07/2017 FINAL  Final     Labs: BNP (last 3 results) Recent Labs    12/06/17 1412  BNP 38.1   Basic Metabolic Panel: Recent Labs  Lab 12/06/17 1412 12/07/17 0400 12/08/17 0443 12/08/17 1259  NA 131* 126* 126* 127*  K 4.1 3.9 3.9 4.5  CL 97* 95* 96* 95*  CO2 25 23 23 25   GLUCOSE 108* 108* 96 101*  BUN 13 14 14 14   CREATININE 0.73 0.89 0.76 0.86  CALCIUM 8.1* 7.9* 7.8* 7.9*   Liver Function Tests: Recent Labs  Lab 12/07/17 0400  AST 24  ALT 12*  ALKPHOS 32*  BILITOT 0.6  PROT 5.2*  ALBUMIN 2.7*   No results for input(s): LIPASE, AMYLASE in the last 168 hours. No results for input(s): AMMONIA in the last 168 hours. CBC: Recent Labs  Lab 12/06/17 1412 12/07/17 0400  WBC 6.4 6.8  NEUTROABS 3.6  --   HGB 13.8 12.2*  HCT 38.9* 35.6*  MCV 92.8 92.5  PLT 278 276   Cardiac Enzymes: No results for input(s): CKTOTAL, CKMB, CKMBINDEX, TROPONINI in the last 168 hours. BNP: Invalid input(s): POCBNP CBG: No results for input(s): GLUCAP in the last 168 hours. D-Dimer No results for input(s): DDIMER in the last 72 hours. Hgb A1c No results for input(s): HGBA1C in the last 72 hours. Lipid Profile No results for input(s): CHOL, HDL, LDLCALC, TRIG, CHOLHDL, LDLDIRECT in the last 72 hours. Thyroid function studies No results for input(s): TSH, T4TOTAL, T3FREE, THYROIDAB in the last 72 hours.  Invalid input(s): FREET3 Anemia work up No results for input(s): VITAMINB12, FOLATE, FERRITIN, TIBC, IRON, RETICCTPCT in the last 72 hours. Urinalysis No results found for: COLORURINE, APPEARANCEUR, LABSPEC, [place], GLUCOSEU, [place], [place], KETONESUR, PROTEINUR,  UROBILINOGEN, NITRITE, LEUKOCYTESUR Sepsis Labs Invalid input(s): PROCALCITONIN,  WBC,  LACTICIDVEN Microbiology Recent Results (from the past 240 hour(s))  Culture, expectorated sputum-assessment  Status: None   Collection Time: 12/07/17 12:34 PM  Result Value Ref Range Status   Specimen Description SPUTUM  Final   Special Requests Normal  Final   Sputum evaluation   Final    THIS SPECIMEN IS ACCEPTABLE FOR SPUTUM CULTURE Performed at [facility], [place] [address] [place], [place] [address]    Report Status 12/07/2017 FINAL  Final  Culture, respiratory (NON-Expectorated)     Status: None (Preliminary result)   Collection Time: 12/07/17 12:34 PM  Result Value Ref Range Status   Specimen Description   Final    SPUTUM Performed at [place] [address], [place], [place] [address]    Special Requests   Final    Normal Reflexed from [id number] Performed at [facility], [place] [address] [place], [place] [address]    Gram Stain   Final    ABUNDANT WBC PRESENT, PREDOMINANTLY PMN RARE SQUAMOUS EPITHELIAL CELLS PRESENT FEW GRAM POSITIVE COCCI IN CHAINS    Culture   Final    TOO YOUNG TO READ Performed at [facility], [place] [address], [place], [place] [address]    Report Status PENDING  Incomplete  Culture, body fluid-bottle     Status: None (Preliminary result)   Collection Time: 12/07/17  3:05 PM  Result Value Ref Range Status   Specimen Description PLEURAL RIGHT  Final   Special Requests NONE  Final   Culture   Final    NO GROWTH < 24 HOURS Performed at [facility], [place] [address], [place], [place] [address]    Report Status PENDING  Incomplete  Gram stain     Status: None   Collection Time: 12/07/17  3:05 PM  Result Value Ref Range Status   Specimen Description PLEURAL RIGHT  Final   Special Requests NONE  Final   Gram Stain   Final    WBC PRESENT,BOTH PMN AND MONONUCLEAR NO ORGANISMS  SEEN CYTOSPIN SMEAR Performed at [facility], [address] [place], [place] [address]    Report Status 12/07/2017 FINAL  Final     Time coordinating discharge in minutes: 65  SIGNED:   Debbe Odea, MD  [facility] 12/08/2017, 2:56 PM Pager   If 7PM-7AM, please contact night-coverage [email or web address] Password [email or web address]

## 2017-12-10 LAB — PROTEIN ELECTROPHORESIS, SERUM
A/G RATIO SPE: 1.3 (ref 0.7–1.7)
ALBUMIN ELP: 3.2 g/dL (ref 2.9–4.4)
Alpha-1-Globulin: 0.2 g/dL (ref 0.0–0.4)
Alpha-2-Globulin: 0.5 g/dL (ref 0.4–1.0)
BETA GLOBULIN: 0.7 g/dL (ref 0.7–1.3)
Gamma Globulin: 1 g/dL (ref 0.4–1.8)
Globulin, Total: 2.4 g/dL (ref 2.2–3.9)
TOTAL PROTEIN ELP: 5.6 g/dL — AB (ref 6.0–8.5)

## 2017-12-10 LAB — CULTURE, RESPIRATORY W GRAM STAIN
Culture: NORMAL
Special Requests: NORMAL

## 2017-12-10 LAB — CULTURE, RESPIRATORY

## 2017-12-11 ENCOUNTER — Ambulatory Visit (HOSPITAL_COMMUNITY)
Admission: RE | Admit: 2017-12-11 | Discharge: 2017-12-11 | Disposition: A | Discharge status: Home / Self Care | Payer: Medicare Other | Source: Ambulatory Visit | Attending: Pulmonary Disease | Admitting: Pulmonary Disease

## 2017-12-11 DIAGNOSIS — R1310 Dysphagia, unspecified: Secondary | ICD-10-CM | POA: Insufficient documentation

## 2017-12-11 DIAGNOSIS — J189 Pneumonia, unspecified organism: Secondary | ICD-10-CM

## 2017-12-11 DIAGNOSIS — J181 Lobar pneumonia, unspecified organism: Principal | ICD-10-CM

## 2017-12-12 LAB — CULTURE, BODY FLUID W GRAM STAIN -BOTTLE: Culture: NO GROWTH

## 2017-12-13 ENCOUNTER — Ambulatory Visit (INDEPENDENT_AMBULATORY_CARE_PROVIDER_SITE_OTHER)
Admission: RE | Admit: 2017-12-13 | Discharge: 2017-12-13 | Disposition: A | Discharge status: Home / Self Care | Payer: Medicare Other | Source: Ambulatory Visit | Attending: Pulmonary Disease | Admitting: Pulmonary Disease

## 2017-12-13 ENCOUNTER — Other Ambulatory Visit: Payer: Self-pay | Admitting: Pulmonary Disease

## 2017-12-13 ENCOUNTER — Encounter: Payer: Self-pay | Admitting: Pulmonary Disease

## 2017-12-13 ENCOUNTER — Other Ambulatory Visit (INDEPENDENT_AMBULATORY_CARE_PROVIDER_SITE_OTHER): Payer: Medicare Other

## 2017-12-13 ENCOUNTER — Ambulatory Visit (INDEPENDENT_AMBULATORY_CARE_PROVIDER_SITE_OTHER): Payer: Medicare Other | Admitting: Pulmonary Disease

## 2017-12-13 VITALS — BP 110/56 | HR 65 | Ht 66.5 in | Wt 153.6 lb

## 2017-12-13 DIAGNOSIS — J90 Pleural effusion, not elsewhere classified: Secondary | ICD-10-CM | POA: Diagnosis not present

## 2017-12-13 DIAGNOSIS — E871 Hypo-osmolality and hyponatremia: Secondary | ICD-10-CM

## 2017-12-13 DIAGNOSIS — R600 Localized edema: Secondary | ICD-10-CM | POA: Diagnosis not present

## 2017-12-13 LAB — COMPREHENSIVE METABOLIC PANEL
ALBUMIN: 3.3 g/dL — AB (ref 3.5–5.2)
ALK PHOS: 37 U/L — AB (ref 39–117)
ALT: 10 U/L (ref 0–53)
AST: 16 U/L (ref 0–37)
BILIRUBIN TOTAL: 0.4 mg/dL (ref 0.2–1.2)
BUN: 18 mg/dL (ref 6–23)
CALCIUM: 8.3 mg/dL — AB (ref 8.4–10.5)
CO2: 26 meq/L (ref 19–32)
CREATININE: 0.83 mg/dL (ref 0.40–1.50)
Chloride: 98 mEq/L (ref 96–112)
GFR: 93.85 mL/min (ref >60.00)
GLUCOSE: 103 mg/dL — AB (ref 70–99)
POTASSIUM: 4.3 meq/L (ref 3.5–5.1)
Sodium: 130 mEq/L — ABNORMAL LOW (ref 135–145)
Total Protein: 5.9 g/dL — ABNORMAL LOW (ref 6.0–8.3)

## 2017-12-13 LAB — CBC WITH DIFFERENTIAL/PLATELET
BASOS ABS: 0 10*3/uL (ref 0.0–0.1)
Basophils Relative: 0.8 % (ref 0.0–3.0)
EOS ABS: 0.4 10*3/uL (ref 0.0–0.7)
Eosinophils Relative: 5.7 % — ABNORMAL HIGH (ref 0.0–5.0)
HCT: 39.9 % (ref 39.0–52.0)
Hemoglobin: 13.5 g/dL (ref 13.0–17.0)
LYMPHS ABS: 1.7 10*3/uL (ref 0.7–4.0)
Lymphocytes Relative: 28.3 % (ref 12.0–46.0)
MCHC: 33.8 g/dL (ref 30.0–36.0)
MCV: 95.9 fl (ref 78.0–100.0)
MONO ABS: 0.7 10*3/uL (ref 0.1–1.0)
MONOS PCT: 10.9 % (ref 3.0–12.0)
NEUTROS ABS: 3.4 10*3/uL (ref 1.4–7.7)
NEUTROS PCT: 54.3 % (ref 43.0–77.0)
PLATELETS: 357 10*3/uL (ref 150.0–400.0)
RBC: 4.16 Mil/uL — AB (ref 4.22–5.81)
RDW: 13.7 % (ref 11.5–15.5)
WBC: 6.2 10*3/uL (ref 4.0–10.5)

## 2017-12-13 MED ORDER — FUROSEMIDE 20 MG PO TABS: 20.0000 mg | ORAL_TABLET | Freq: Every day | ORAL | 11 refills | Status: DC

## 2017-12-13 NOTE — Progress Notes (Signed)
Your lab results look good.  Your sodium level is improved.  Your albumin level is still low but improving from where it was when you were in the hospital.  We will continue to monitor.  No current changes at this time.  Keep her follow-up on 01/01/2018.  [name] FNP

## 2017-12-13 NOTE — Assessment & Plan Note (Signed)
Lab work today  CBC and CMET

## 2017-12-13 NOTE — Progress Notes (Signed)
@Patient  ID: Stuart Watson, male    DOB: 03/05/1932, 84 y.o.   MRN: [id number]  Chief Complaint  Patient presents with  . Follow-up    Referring provider: Wenda Low, MD  HPI: 84 year old male former smoker with chronic cough and lung nodules (stable 2014 10/10/2015).  And recurrent pneumonia. Dr. [name] patient for previous aspiration pneumonitis.  Past medical history- temporal arteritis optic neuritis, GERD (on PPI and H2), AR  Recent [facility] Encounters:   12/30/2015- appointment with [name] Had been seen since 2015 with Dr. clients for cough.  Said that sometimes he gets a cough and feels like things get stuck in his throat. At this time was thought to have recurrent aspiration which was leaving leading to development of pneumonitis  02/22/2016-follow-up for aspiration pneumonia Patient return to office is feeling better reporting cough has improved.  Has been off steroids for 2 weeks.  Will get follow-up CT in 6 months.  06/29/2016-office visit >>>Seen by Dr. [name] instructed to follow-up in a year stable and doing well.  CT chest reviewed GGO is resolved.  Nodule stable.  11/13/2017-acute visit >>>Patient states that reporting today because for the last 6 months they have not been feeling as well.  Feels activity tolerance is decreasing gets more winded easily.  Reporting daily dry cough.  As well as chronic ankle edema.  Taking Lasix daily.  Being followed by GI for GERD.  Chest x-ray at appointment reveals potential pneumonia.  Started on Z-Pak. Hyponatremic with labs.   11/22/17 office visit pneumonia hyponatremia >>>Patient presents from a one-week follow-up still has a progressive cough and shortness of breath over the last 6 months.  Patient is supposed to be on Lasix 20 mg daily but reports he is only taking 1/2 tablet to decrease his urination issues.  He continues to have chronic leg swelling.  Chest x-ray today shows no improvement with left-sided atelectatic changes  and effusion.  Patient was initially treated with a Z-Pak.  Patient started on Levaquin (11/22/17).  The patient's hyponatremia has improved.  BNP at this point in time is normal.   11/29/2017 OV >>>Patient returns to office today after complaints of continued shortness of breath, fatigue, and no improvement after 2 rounds of antibiotics (Z-Pak, Levaquin).  Patient states he noticed improvement in edema with 3-day trial of 40 mg of Lasix since last appointment.  Patient states edema returned as soon as he resume 20 mg daily.  Patient feels that he is actually feeling worse than how he felt prior to the Levaquin being started.  Wife mentions during ROS in initial patient interview the patient has been having continued episodes of feeling like he is choking while eating and choking when he coughs. Pt to get stat chest x-ray today in office.   Tests:  PFT 04/21/2014--FEV1 2.18 (98%), FEV1 73, TLC 6.45 (96%), DLCO 74%, no BD  Imaging:  CT chest 12/27/2015-test patchy consolidation LLL, GGO space 1.9 cm R UL, patchy GGO RML and RLL  CT chest 06/27/2016--GGO is resolved, no change in nodules  11/13/2017-chest x-ray-small pleural effusions and lower lobe opacity favoring atelectasis  11/22/2017- chest x-ray- little change from 4/24 x-ray, minimal improvement in the pleural effusions, pneumonia at left lung base is definite consideration 12/05/2017-CT chest with contrast- moderately large bilateral pleural effusions which appear free-flowing, resulting in partial atelectasis in both lower lobes >>>small noncalcified lung nodules with a calcified granuloma in the right middle lobe these changes are most likely postinflammatory or postinfectious in  origin >>>small pericardial effusion 12/08/2017-chest x-ray- no significant residual pleural fluid, no evidence of pneumothorax or reexpansion edema, no acute findings post thoracentesis  Cardiac:  Echo 07/27/2013--EF 60 to 65% 12/04/2017- echo-grade 1 diastolic dysfunction,  EF 60-65 percent   Labs:  11/13/17 - Sodium level  128 11/22/17 - Sodium level 132-hyponatremia resolving 11/13/17-BNP-31 12/06/2017-BNP-45.5 12/07/2017-protein electrophoresis-total protein low 5.6 12/07/2017-CBC-hemoglobin 12.2 12/07/2017- comprehensive metabolic panel- albumin 2.7, total protein 5.2 6/43/3295-[id number] metabolic panel- sodium 660, chloride 95, calcium 7.9, adequate kidney functioning.  GFR greater than 60  Micro:  12/07/2017- fungal sputum culture-no fungus observed  12/07/17 - Sputum culture -rare squamous epithelial cells present, few gram-positive cocci in chains   Chart Review:  GI Tests: 04/18/16 - Upper Endoscopy and [facility]    12/06/2017-hospitalization-pleural effusion, discharge 12/08/2017 >>>Follow-up on pleural effusion fluid as well as sputum culture results >>>12/07/2017- right sided thoracentesis performed-1.7 L removed fluid consistent with transudate, 960 WBCs with 57% lymphs, Gram stain does not show any organisms, No malignant cells identified in right pleural fluid >>>12/08/2017- left-sided thoracentesis done-1.7 L again removed >>>Hyponatremia present on admission to hospital, given 1 L normal saline to help bring up he is asymptomatic  12/11/2017-swallow eval- mild aspiration risk no treatment changes at this time, regular solids and thin liquid    12/13/17  HFU  Pt presents to office today for HFU and scheduled follow up. CXRY today shows new small left effusion. Pt reports clinically feeling much better, able to work out 2x this week.  Patient also reporting that he can breathe much better, as well as not having any orthopnea.  Patient reporting they completed a modified barium swallow eval.  Patient will also follow-up with Dr. [name] for the already planned appointment on 01/01/2018 for pulmonary function as well as office visit follow-up.   No Known Allergies  Immunization History  Administered Date(s) Administered  . Influenza, High Dose Seasonal PF  04/22/2017  . Influenza,inj,Quad PF,6+ Mos 04/23/2015  . Pneumococcal Conjugate-13 07/23/2016  . Pneumococcal Polysaccharide-23 11/13/2012    Past Medical History:  Diagnosis Date  . Actinic keratoses   . Arthritis   . Colon polyps   . Dyspnea 07/13/2013   BNP normal  . Edema 07/13/2013  . GERD (gastroesophageal reflux disease)    omeprazole 1-2 times daily  . H/O TIA (transient ischemic attack) and stroke    on statins and plavix -saw Dr.Sethi in the past  . History of BPH   . History of TIA (transient ischemic attack) 07/13/2013  . Lumbar radiculopathy   . Need for shingles vaccine   . Optic neuritis 10/2015   left eye  . Perennial allergic rhinitis   . Polymyalgia rheumatica ([place]) 07/13/2013   Daily prednisone  . Restless leg syndrome   . Retinal disease    right eye since birth   . Shingles   . Temporal arteritis ([place]) 10/2015    Tobacco History: Social History   Tobacco Use  Smoking Status Former Smoker  . Packs/day: 1.00  . Years: 15.00  . Pack years: 15.00  . Types: Cigarettes  . Last attempt to quit: 07/23/1975  . Years since quitting: 42.4  Smokeless Tobacco Never Used   Counseling given: Not Answered   Outpatient Encounter Medications as of 12/13/2017  Medication Sig  . acetaminophen (TYLENOL) 325 MG tablet Take 650 mg by mouth every 6 (six) hours as needed for mild pain.   . cetirizine (ZYRTEC) 10 MG tablet Take 10 mg by mouth daily as needed.  Reported on 12/30/2015  . clopidogrel (PLAVIX) 75 MG tablet Take 75 mg by mouth daily with breakfast.  . dextromethorphan-guaiFENesin (MUCINEX DM) 30-600 MG 12hr tablet Take 1 tablet by mouth 2 (two) times daily.  Marland Kitchen doxazosin (CARDURA) 8 MG tablet Take 8 mg by mouth daily.  . famotidine (PEPCID) 20 MG tablet Take 20 mg by mouth at bedtime.  . fluticasone (VERAMYST) 27.5 MCG/SPRAY nasal spray Place 2 sprays into the nose daily as needed.   . Multiple Vitamin (MULTI-VITAMINS) TABS Take 1 tablet by mouth daily.  Marland Kitchen  omeprazole (PRILOSEC) 20 MG capsule Take 1 capsule (20 mg total) by mouth daily.  . simvastatin (ZOCOR) 20 MG tablet Take 20 mg by mouth daily.  . furosemide (LASIX) 20 MG tablet Take 1 tablet (20 mg total) by mouth daily.  . [DISCONTINUED] polyethylene glycol (MIRALAX / GLYCOLAX) packet Take 17 g by mouth daily as needed for mild constipation. (Patient not taking: Reported on 12/13/2017)   No facility-administered encounter medications on file as of 12/13/2017.      Review of Systems  Constitutional:   No  weight loss, night sweats,  fevers, chills, fatigue, or  lassitude HEENT:   No headaches,  Difficulty swallowing,  Tooth/dental problems, or  Sore throat, No sneezing, itching, ear ache, nasal congestion, post nasal drip  CV: No chest pain,  orthopnea, PND, swelling in lower extremities, anasarca, dizziness, palpitations, syncope  GI: No heartburn, indigestion, abdominal pain, nausea, vomiting, diarrhea, change in bowel habits, loss of appetite, bloody stools Resp: No shortness of breath with exertion or at rest.  No excess mucus, no productive cough,  No non-productive cough,  No coughing up of blood.  No change in color of mucus.  No wheezing.  No chest wall deformity Skin: no rash, lesions, no skin changes. GU: no dysuria, change in color of urine, no urgency or frequency.  No flank pain, no hematuria  MS:  No joint pain or swelling.  No decreased range of motion.  No back pain. Psych:  No change in mood or affect. No depression or anxiety.  No memory loss.   Physical Exam  BP (!) 110/56 (BP Location: Left Arm, Cuff Size: Normal)   Pulse 65   Ht 5' 6.5" (1.689 m)   Wt 153 lb 9.6 oz (69.7 kg)   SpO2 98%   BMI 24.42 kg/m   GEN: A/Ox3; pleasant , NAD, well nourished    HEENT:  [place]/AT,  EACs-right occluded with cerumen, TMs-right unable to be visualize, NOSE-clear, THROAT-clear, no lesions, no postnasal drip or exudate noted.   NECK:  Supple w/ fair ROM; no JVD; normal carotid  impulses w/o bruits; no thyromegaly or nodules palpated; no lymphadenopathy.    RESP:  Clear  P & A, slightly diminished in left base; w/o, wheezes/ rales/ or rhonchi. no accessory muscle use, no dullness to percussion  CARD:  1-2+ LE edema bilaterally RRR, no m/r/g, pulses intact, no cyanosis- or clubbing.  GI:   Soft & nt; nml bowel sounds; no organomegaly or masses detected.   Musco: Warm bil, no deformities or joint swelling noted.   Neuro: alert, no focal deficits noted.    Skin: Warm, no lesions or rashes    Lab Results:  CBC    Component Value Date/Time   WBC 6.8 12/07/2017 0400   RBC 3.85 (L) 12/07/2017 0400   HGB 12.2 (L) 12/07/2017 0400   HCT 35.6 (L) 12/07/2017 0400   PLT 276 12/07/2017 0400   MCV  92.5 12/07/2017 0400   MCH 31.7 12/07/2017 0400   MCHC 34.3 12/07/2017 0400   RDW 13.5 12/07/2017 0400   LYMPHSABS 2.1 12/06/2017 1412   MONOABS 0.4 12/06/2017 1412   EOSABS 0.2 12/06/2017 1412   BASOSABS 0.0 12/06/2017 1412    BMET    Component Value Date/Time   NA 127 (L) 12/08/2017 1259   K 4.5 12/08/2017 1259   CL 95 (L) 12/08/2017 1259   CO2 25 12/08/2017 1259   GLUCOSE 101 (H) 12/08/2017 1259   BUN 14 12/08/2017 1259   CREATININE 0.86 12/08/2017 1259   CALCIUM 7.9 (L) 12/08/2017 1259   GFRNONAA >60 12/08/2017 1259   GFRAA >60 12/08/2017 1259    BNP    Component Value Date/Time   BNP 45.5 12/06/2017 1412    ProBNP    Component Value Date/Time   PROBNP 31.0 11/13/2017 1150    Imaging: Dg Chest 1 View  Result Date: 12/08/2017 CLINICAL DATA:  Thoracentesis EXAM: CHEST  1 VIEW COMPARISON:  Yesterday FINDINGS: No significant residual pleural fluid. No evidence of pneumothorax or re-expansion edema. Mild atelectatic type opacities at the bases. Normal heart size and mediastinal contours. IMPRESSION: No acute finding after thoracentesis.  No visible residual fluid. Electronically Signed   By: Monte Fantasia M.D.   On: 12/08/2017 12:42   Dg Chest  1 View  Result Date: 12/07/2017 CLINICAL DATA:  Patient status post right thoracentesis. EXAM: CHEST  1 VIEW COMPARISON:  Chest radiograph 11/29/2017 FINDINGS: Monitoring leads overlie the patient. Stable cardiomegaly. Interval resolution of right pleural effusion. Persistent moderate left pleural effusion. Heterogeneous opacities left lung base. No pneumothorax. Thoracic spine degenerative changes. IMPRESSION: Interval resolution right pleural effusion. Persistent moderate left pleural effusion with underlying atelectasis. Electronically Signed   By: Lovey Newcomer M.D.   On: 12/07/2017 15:52   Dg Chest 2 View  Result Date: 12/13/2017 CLINICAL DATA:  84 year old male with a history of bilateral pleural effusions. EXAM: CHEST - 2 VIEW COMPARISON:  12/08/2017, 12/07/2017, CT 12/04/2017 FINDINGS: Cardiomediastinal silhouette unchanged in size and contour. Opacities at the bilateral lung bases somewhat obscure the bilateral heart borders and hemidiaphragms. The left sided opacity is increased since the prior plain film of 12/08/2017. Blunting of the costophrenic sulcus on the lateral view. Thickening of the fissures. No pneumothorax. No confluent airspace disease. No interlobular septal thickening. IMPRESSION: Bilateral small pleural effusions. The left appears to have recurred after prior thoracentesis 12/08/2017. Electronically Signed   By: Corrie Mckusick D.O.   On: 12/13/2017 13:51   Dg Chest 2 View  Result Date: 11/29/2017 CLINICAL DATA:  Shortness of breath and weakness. EXAM: CHEST - 2 VIEW COMPARISON:  11/22/2017 FINDINGS: Normal heart size. Mild aortic atherosclerosis. There is persistent bilateral pleural effusions, left greater than right. Streaky areas of atelectasis within both lung bases again noted. Spondylosis noted within the thoracic spine. IMPRESSION: 1. No change in bilateral pleural effusions, left greater than right. 2.  Aortic Atherosclerosis (ICD10-I70.0). Electronically Signed   By: Kerby Moors M.D.   On: 11/29/2017 09:40   Dg Chest 2 View  Result Date: 11/22/2017 CLINICAL DATA:  History of pneumonia and effusions, cough, follow-up, former smoking history EXAM: CHEST - 2 VIEW COMPARISON:  Chest x-ray of 11/13/2017 FINDINGS: There is little change to perhaps minimal improvement in small bilateral pleural effusions and basilar atelectasis left-greater-than-right. Pneumonia at the left lung base is a definite consideration. Mediastinal and hilar contours are stable and heart size is stable. There are mild  degenerative changes in the thoracic spine. IMPRESSION: Little change to minimal improvement in bibasilar opacities consistent with effusion, atelectasis, and possible left basilar pneumonia. Electronically Signed   By: Ivar Drape M.D.   On: 11/22/2017 09:48   Ct Chest W Contrast  Result Date: 12/05/2017 CLINICAL DATA:  Short of breath, coughing, wheezing for 3-4 weeks EXAM: CT CHEST WITH CONTRAST TECHNIQUE: Multidetector CT imaging of the chest was performed during intravenous contrast administration. CONTRAST:  52mL ISOVUE-300 IOPAMIDOL (ISOVUE-300) INJECTION 61% COMPARISON:  Chest x-ray of 11/29/2016 and 11/13/2016 FINDINGS: Cardiovascular: There is good opacification of the thoracic aorta and the pulmonary arteries. No acute abnormality is seen. Moderate thoracic aortic atherosclerosis is present. There are diffuse coronary artery calcifications. The heart is within normal limits in size and there is a small pericardial effusion noted. Mediastinum/Nodes: No mediastinal or hilar adenopathy seen with only small mediastinal lymph nodes present. The thyroid gland is unremarkable. No definite hiatal hernia is seen. Lungs/Pleura: There are moderately large bilateral pleural effusions present which appear free-flowing. As result of these effusions, there is partial atelectasis of both lower lobes. No definite pneumonia is seen. A 7 mm pleural-based nodule is present in the right lower lobe  laterally on image 100 series 3. A 6 mm noncalcified subpleural nodule is present in the right middle lobe anterolaterally on image 128. A small calcified granuloma is present within the right middle lobe on image 99. No additional lung nodule is seen. These nodules most likely are postinflammatory possibly due to prior granulomatous disease. The central airway is patent. Upper Abdomen: No abnormality of the upper abdomen is noted on the few images obtained other than probable diffuse fluid overload with strandiness of the soft tissues diffusely. Musculoskeletal: The thoracic vertebrae are in normal alignment with diffuse degenerative change. No compression deformity is seen. IMPRESSION: 1. Moderately large bilateral pleural effusions which appear free-flowing, resulting in partial atelectasis of both lower lobes. No definite pneumonia. 2. Small noncalcified lung nodules with a calcified granuloma in the right middle lobe. These changes most likely are postinflammatory or post infectious in origin. 3. Moderate thoracic aortic atherosclerosis and diffuse coronary artery calcifications. 4. Small pericardial effusion. Electronically Signed   By: Ivar Drape M.D.   On: 12/05/2017 08:47   Dg Op Swallowing Func-medicare/speech Path  Result Date: 12/11/2017 Objective Swallowing Evaluation: Type of Study: MBS-Modified Barium Swallow Study  Patient Details Name: ESKER DEVER MRN: [id number] Date of Birth: December 25, 1931 Today's Date: 12/11/2017 Time: SLP Start Time (ACUTE ONLY): 1310 -SLP Stop Time (ACUTE ONLY): 1325 SLP Time Calculation (min) (ACUTE ONLY): 15 min Past Medical History: Past Medical History: Diagnosis Date . Actinic keratoses  . Arthritis  . Colon polyps  . Dyspnea 07/13/2013  BNP normal . Edema 07/13/2013 . GERD (gastroesophageal reflux disease)   omeprazole 1-2 times daily . H/O TIA (transient ischemic attack) and stroke   on statins and plavix -saw Dr.Sethi in the past . History of BPH  . History of TIA  (transient ischemic attack) 07/13/2013 . Lumbar radiculopathy  . Need for shingles vaccine  . Optic neuritis 10/2015  left eye . Perennial allergic rhinitis  . Polymyalgia rheumatica ([place]) 07/13/2013  Daily prednisone . Restless leg syndrome  . Retinal disease   right eye since birth  . Shingles  . Temporal arteritis ([place]) 10/2015 Past Surgical History: Past Surgical History: Procedure Laterality Date . BRAVO [place] STUDY N/A 04/18/2016  Procedure: BRAVO [place];  Surgeon: Wilford Corner, MD;  Location: [facility];  Service: Endoscopy;  Laterality: N/A; . COLONOSCOPY   . drropy eyelid surgery   . ESOPHAGOGASTRODUODENOSCOPY (EGD) WITH PROPOFOL N/A 04/18/2016  Procedure: ESOPHAGOGASTRODUODENOSCOPY (EGD) WITH PROPOFOL;  Surgeon: Wilford Corner, MD;  Location: [facility];  Service: Endoscopy;  Laterality: N/A; . VASECTOMY   HPI: 84 yo male referred for MBS due to pt complaint of dysphagia and concern for recent aspiration pneumonitis.  Pt PMH + for GERD, TIA, CAD, bilateral pleural effusion, CHF, temporal arteritis - optic neuritis, lung nodules, post nasal drip, PMR, shingles.  Pt on PPI, H2 blocker.  Pt is s/p thoracentesis 12/08/2017.  He admits to having problems with "mucus" but denies severe problems swallowing. Pt states he has undergone endoscopy and ? pH probe in the past with negative results.   Subjective: pt awake in chair Assessment / Plan / Recommendation CHL IP CLINICAL IMPRESSIONS 12/11/2017 Clinical Impression Pt presents with normal oropharyngeal swallow ability.  No aspiration/penetration or significant residuals during intake - despite being taxed to consume sequential liquid boluses.  Pt did appear with prominent cricophayrngeus (only viewed x1 when taking barium tablet) that did not impair barium flow.  Pt does report xerostomia.  Of note, pt admits to improved mucus management since he started taking his PPI again recently.   SLP Visit Diagnosis Dysphagia, unspecified (R13.10) Attention and  concentration deficit following -- Frontal lobe and executive function deficit following -- Impact on safety and function Mild aspiration risk   CHL IP TREATMENT RECOMMENDATION 12/11/2017 Treatment Recommendations No treatment recommended at this time   No flowsheet data found. CHL IP DIET RECOMMENDATION 12/11/2017 SLP Diet Recommendations Regular solids;Thin liquid Liquid Administration via Cup;Straw Medication Administration Whole meds with liquid Compensations Slow rate;Small sips/bites Postural Changes Remain semi-upright after after feeds/meals (Comment);Seated upright at 90 degrees   CHL IP OTHER RECOMMENDATIONS 12/11/2017 Recommended Consults -- Oral Care Recommendations Oral care BID Other Recommendations --   No flowsheet data found.  No flowsheet data found.     CHL IP ORAL PHASE 12/11/2017 Oral Phase WFL Oral - Pudding Teaspoon -- Oral - Pudding Cup -- Oral - Honey Teaspoon -- Oral - Honey Cup -- Oral - Nectar Teaspoon -- Oral - Nectar Cup WFL Oral - Nectar Straw -- Oral - Thin Teaspoon -- Oral - Thin Cup WFL Oral - Thin Straw WFL Oral - Puree WFL Oral - Mech Soft NT Oral - Regular WFL Oral - Multi-Consistency -- Oral - Pill WFL Oral Phase - Comment --  CHL IP PHARYNGEAL PHASE 12/11/2017 Pharyngeal Phase WFL Pharyngeal- Pudding Teaspoon -- Pharyngeal -- Pharyngeal- Pudding Cup -- Pharyngeal -- Pharyngeal- Honey Teaspoon -- Pharyngeal -- Pharyngeal- Honey Cup -- Pharyngeal -- Pharyngeal- Nectar Teaspoon -- Pharyngeal -- Pharyngeal- Nectar Cup WFL Pharyngeal -- Pharyngeal- Nectar Straw -- Pharyngeal -- Pharyngeal- Thin Teaspoon -- Pharyngeal -- Pharyngeal- Thin Cup WFL Pharyngeal -- Pharyngeal- Thin Straw WFL Pharyngeal -- Pharyngeal- Puree WFL Pharyngeal -- Pharyngeal- Mechanical Soft -- Pharyngeal -- Pharyngeal- Regular WFL Pharyngeal -- Pharyngeal- Multi-consistency -- Pharyngeal -- Pharyngeal- Pill WFL Pharyngeal -- Pharyngeal Comment --  CHL IP CERVICAL ESOPHAGEAL PHASE 12/11/2017 Cervical Esophageal Phase  WFL Pudding Teaspoon -- Pudding Cup -- Honey Teaspoon -- Honey Cup -- Nectar Teaspoon -- Nectar Cup WFL Nectar Straw -- Thin Teaspoon -- Thin Cup WFL;Prominent cricopharyngeal segment Thin Straw WFL Puree WFL Mechanical Soft -- Regular WFL Multi-consistency -- Pill WFL Cervical Esophageal Comment -- No flowsheet data found. Luanna Salk, [facility] SLP [phone] Macario Golds 12/11/2017, 1:56 PM  CLINICAL DATA:  Dysphagia. EXAM: MODIFIED BARIUM SWALLOW TECHNIQUE: Different consistencies of barium were administered orally to the patient by the Speech Pathologist. Imaging of the pharynx was performed in the lateral projection. FLUOROSCOPY TIME:  Fluoroscopy Time:  54 seconds Radiation Exposure Index (if provided by the fluoroscopic device): 4.7 mGy COMPARISON:  None. FINDINGS: Thin liquid- within normal limits Nectar thick liquid- within normal limits Pure- within normal limits Pure with cracker- within normal limits Barium tablet -  within normal limits IMPRESSION: Normal exam. Please refer to the Speech Pathologists report for complete details and recommendations. Electronically Signed   By: Lorriane Shire M.D.   On: 12/11/2017 13:47   US Thoracentesis Asp Pleural Space W/img Guide  Result Date: 12/08/2017 INDICATION: Patient with history of CHF, dyspnea, bilateral pleural effusions, status post right thoracentesis on 12/07/2017. Request now received for therapeutic left thoracentesis. EXAM: ULTRASOUND GUIDED THERAPEUTIC LEFT THORACENTESIS MEDICATIONS: None COMPLICATIONS: None immediate. PROCEDURE: An ultrasound guided thoracentesis was thoroughly discussed with the patient and questions answered. The benefits, risks, alternatives and complications were also discussed. The patient understands and wishes to proceed with the procedure. Written consent was obtained. Ultrasound was performed to localize and mark an adequate pocket of fluid in the left chest. The area was then prepped and draped in the  normal sterile fashion. 1% Lidocaine was used for local anesthesia. Under ultrasound guidance a 6 Fr Safe-T-Centesis catheter was introduced. Thoracentesis was performed. The catheter was removed and a dressing applied. FINDINGS: A total of approximately 1.7 liters of yellow fluid was removed. IMPRESSION: Successful ultrasound guided therapeutic left thoracentesis yielding 1.7 liters of pleural fluid. Read by: Rowe Robert, PA-C No pneumothorax on follow-up radiograph. Electronically Signed   By: Lucrezia Europe M.D.   On: 12/08/2017 12:13   US Thoracentesis Asp Pleural Space W/img Guide  Result Date: 12/07/2017 INDICATION: Patient with history of CHF, dyspnea, bilateral pleural effusions, right greater than left. Request made for diagnostic and therapeutic right thoracentesis. EXAM: ULTRASOUND GUIDED DIAGNOSTIC AND THERAPEUTIC RIGHT THORACENTESIS MEDICATIONS: None COMPLICATIONS: None immediate. PROCEDURE: An ultrasound guided thoracentesis was thoroughly discussed with the patient and questions answered. The benefits, risks, alternatives and complications were also discussed. The patient understands and wishes to proceed with the procedure. Written consent was obtained. Ultrasound was performed to localize and mark an adequate pocket of fluid in the right chest. The area was then prepped and draped in the normal sterile fashion. 1% Lidocaine was used for local anesthesia. Under ultrasound guidance a 6 Fr Safe-T-Centesis catheter was introduced. Thoracentesis was performed. The catheter was removed and a dressing applied. FINDINGS: A total of approximately 1.8 liters of yellow fluid was removed. Samples were sent to the laboratory as requested by the clinical team. IMPRESSION: Successful ultrasound guided diagnostic and therapeutic right thoracentesis yielding 1.8 liters of pleural fluid. Read by: Rowe Robert, PA-C Electronically Signed   By: Lucrezia Europe M.D.   On: 12/07/2017 15:33     Assessment & Plan:    Pleasant 84 year old patient arrives today for hospital follow-up as well as scheduled office visit.  Patient status post bilateral thoracentesis both removing 1.5 L of fluid.  Fluid appears transudative and no malignant cells were identified.  Only right sided thoracentesis fluid was sent for analysis.  There is no analysis on the left thoracentesis fluid seen in the EMR.  We will restart Lasix 20 mg daily today.  Patient will also weigh regularly.  We will keep in close contact as far as of respiratory symptoms  or weight changes.  Will keep follow-up appointment on 6/12 with Dr. [name].  Will complete pulmonary function test prior to that appointment.  Will repeat CBC and Cmet today.  Will plan for chest x-ray on 6/12 as well.  Pedal edema- chronic Lab work today  CBC and CMET   Restart Lasix 20mg  daily  Weigh yourself daily in the AM >>>if you weight is increasing please call the office and let us know    Bilateral pleural effusion Small left effusion on xray today. Stable and clinically improved from 5/10 appt and s/p bilateral thoracentesis.   Lab work today  CBC and CMET  Follow up with Dr. [name] on 6/12 Complete Pulmonary function test on 6/12 Restart Lasix 20mg  daily  Weigh yourself daily in the AM >>>if you weight is increasing please call the office and let us know  We will repeat CXRY on 6/12  Hyponatremia Lab work today  CBC and CMET    This appointment was 42 minutes long.  With over 50% of that time in direct face-to-face patient care, assessment, plan of care discussion, education.   Lauraine Rinne, NP 12/13/2017

## 2017-12-13 NOTE — Patient Instructions (Signed)
Lab work today  CBC and CMET   Can drink ensure in addition to meals to help with protein   Follow up with Dr. [name] on 6/12 Complete Pulmonary function test on 6/12  Restart Lasix 20mg  daily   Weigh yourself daily in the AM >>>if you weight is increasing please call the office and let us know   We will repeat CXRY on 6/12  Please contact the office if your symptoms worsen or you have concerns that you are not improving.   Thank you for choosing [facility] for your healthcare, and for allowing [place] to partner with you on your healthcare journey. I am thankful to be able to provide care to you today.   [name] FNP-C

## 2017-12-13 NOTE — Progress Notes (Signed)
We discussed these results in your office appointment today.  We will continue to monitor.  We will do another chest x-ray on her 01/01/2018 appointment with Dr. [name] to monitor.  Please let us know if you are having issues with shortness of breath or changes in your respiratory status.  [name] FNP

## 2017-12-13 NOTE — Assessment & Plan Note (Signed)
Small left effusion on xray today. Stable and clinically improved from 5/10 appt and s/p bilateral thoracentesis.   Lab work today  CBC and CMET  Follow up with Dr. [name] on 6/12 Complete Pulmonary function test on 6/12 Restart Lasix 20mg  daily  Weigh yourself daily in the AM >>>if you weight is increasing please call the office and let us know  We will repeat CXRY on 6/12

## 2017-12-13 NOTE — Assessment & Plan Note (Signed)
Lab work today  CBC and CMET   Restart Lasix 20mg  daily  Weigh yourself daily in the AM >>>if you weight is increasing please call the office and let us know

## 2017-12-17 NOTE — Progress Notes (Signed)
Reviewed and agree with assessment/plan.   Zubayr Bednarczyk, MD [facility] 07/18/2016, 12:24 PM Pager:  [phone]  

## 2017-12-19 DIAGNOSIS — G459 Transient cerebral ischemic attack, unspecified: Secondary | ICD-10-CM | POA: Diagnosis not present

## 2017-12-19 DIAGNOSIS — J309 Allergic rhinitis, unspecified: Secondary | ICD-10-CM | POA: Diagnosis not present

## 2017-12-19 DIAGNOSIS — R600 Localized edema: Secondary | ICD-10-CM | POA: Diagnosis not present

## 2017-12-19 DIAGNOSIS — K219 Gastro-esophageal reflux disease without esophagitis: Secondary | ICD-10-CM | POA: Diagnosis not present

## 2017-12-19 DIAGNOSIS — J90 Pleural effusion, not elsewhere classified: Secondary | ICD-10-CM | POA: Diagnosis not present

## 2017-12-31 ENCOUNTER — Other Ambulatory Visit: Payer: Self-pay | Admitting: Pulmonary Disease

## 2017-12-31 DIAGNOSIS — R0600 Dyspnea, unspecified: Secondary | ICD-10-CM

## 2018-01-01 ENCOUNTER — Encounter: Payer: Self-pay | Admitting: Pulmonary Disease

## 2018-01-01 ENCOUNTER — Other Ambulatory Visit (INDEPENDENT_AMBULATORY_CARE_PROVIDER_SITE_OTHER): Payer: Medicare Other

## 2018-01-01 ENCOUNTER — Ambulatory Visit (INDEPENDENT_AMBULATORY_CARE_PROVIDER_SITE_OTHER)
Admission: RE | Admit: 2018-01-01 | Discharge: 2018-01-01 | Disposition: A | Discharge status: Home / Self Care | Payer: Medicare Other | Source: Ambulatory Visit | Attending: Pulmonary Disease | Admitting: Pulmonary Disease

## 2018-01-01 ENCOUNTER — Ambulatory Visit (INDEPENDENT_AMBULATORY_CARE_PROVIDER_SITE_OTHER): Payer: Medicare Other | Admitting: Pulmonary Disease

## 2018-01-01 VITALS — BP 132/60 | HR 69 | Ht 66.0 in | Wt 163.0 lb

## 2018-01-01 DIAGNOSIS — R0602 Shortness of breath: Secondary | ICD-10-CM

## 2018-01-01 DIAGNOSIS — I509 Heart failure, unspecified: Secondary | ICD-10-CM

## 2018-01-01 DIAGNOSIS — R609 Edema, unspecified: Secondary | ICD-10-CM | POA: Diagnosis not present

## 2018-01-01 DIAGNOSIS — J90 Pleural effusion, not elsewhere classified: Secondary | ICD-10-CM | POA: Diagnosis not present

## 2018-01-01 LAB — CBC WITH DIFFERENTIAL/PLATELET
Basophils Absolute: 0 10*3/uL (ref 0.0–0.1)
Basophils Relative: 1 % (ref 0.0–3.0)
EOS ABS: 0 10*3/uL (ref 0.0–0.7)
EOS PCT: 0.9 % (ref 0.0–5.0)
HCT: 40.3 % (ref 39.0–52.0)
Hemoglobin: 13.6 g/dL (ref 13.0–17.0)
LYMPHS ABS: 1 10*3/uL (ref 0.7–4.0)
Lymphocytes Relative: 21.4 % (ref 12.0–46.0)
MCHC: 33.8 g/dL (ref 30.0–36.0)
MCV: 96.3 fl (ref 78.0–100.0)
MONO ABS: 0.5 10*3/uL (ref 0.1–1.0)
Monocytes Relative: 11.3 % (ref 3.0–12.0)
Neutro Abs: 3 10*3/uL (ref 1.4–7.7)
Neutrophils Relative %: 65.4 % (ref 43.0–77.0)
Platelets: 307 10*3/uL (ref 150.0–400.0)
RBC: 4.19 Mil/uL — AB (ref 4.22–5.81)
RDW: 13.8 % (ref 11.5–15.5)
WBC: 4.5 10*3/uL (ref 4.0–10.5)

## 2018-01-01 LAB — BASIC METABOLIC PANEL
BUN: 17 mg/dL (ref 6–23)
CALCIUM: 8.2 mg/dL — AB (ref 8.4–10.5)
CO2: 27 meq/L (ref 19–32)
Chloride: 97 mEq/L (ref 96–112)
Creatinine, Ser: 0.78 mg/dL (ref 0.40–1.50)
GFR: 100.82 mL/min (ref >60.00)
Glucose, Bld: 86 mg/dL (ref 70–99)
POTASSIUM: 4.4 meq/L (ref 3.5–5.1)
SODIUM: 128 meq/L — AB (ref 135–145)

## 2018-01-01 LAB — BRAIN NATRIURETIC PEPTIDE: PRO B NATRI PEPTIDE: 60 pg/mL (ref 0.0–100.0)

## 2018-01-01 NOTE — Progress Notes (Signed)
[facility]  Chief Complaint  Patient presents with  . Acute Visit    Pt was suppose to have PFT prior to OV today; pt is having hard time breathing therefore decided not to attempt PFT today. Pt has gained 7lbs in 6 days. Pt has increase productive cough-green/yellow, wheezing, SOB rest and walking, breathing worsen in last week.     Vital signs: BP 132/60 (BP Location: Left Arm, Cuff Size: Normal)   Pulse 69   Ht 5\' 6"  (1.676 m)   Wt 163 lb (73.9 kg)   SpO2 96%   BMI 26.31 kg/m   History of Present Illness: Stuart Watson is a 84 y.o. male former smoker with chronic cough and pleural effusions.  He was to have PFT today.  Was too short of breath.  Has gained about 9 lbs over past few days.  Getting more leg swelling.  Can't lay flat.  Feels band around chest.  Has cough with occasional sputum.  Not having fever, hemoptysis.  Has been using lasix 20 mg daily.  Physical Exam:  General - pleasant Eyes - pupils reactive ENT - no sinus tenderness, no oral exudate, no LAN Cardiac - regular, no murmur Chest - decreased BS at bases Lt > Rt Abd - soft, non tender Ext - 2+ lower leg edema Skin - no rashes Neuro - normal strength Psych - normal mood   Assessment/Plan:  Acute CHF exacerbation with recurrent pleural effusions. - increase lasix to 40 mg bid - CXR, lab tests toda - monitor weight - might need repeat thoracentesis - if no improvement or gets worse, then go to hospital   Patient Instructions  Lasix 40 mg twice per day until next follow up appointmet Chest and lab tests today Monitor your weight daily  Follow up on 01/03/18 with Nurse Practitioner    Chesley Mires, MD [place] 01/01/2018, 10:35 AM  Flow Sheet  Pulmonary tests: CT chest 12/05/17 >> large b/l pleural effusions Rt thoracentesis 12/07/17 >> LDH 74, protein 3.5, WBC 960, reactive mesothelial cells and lymphocytes  Cardiac  tests: Echo 12/04/17 >> EF 60 to 65%, PAS 35 mmHg  Pulmonary tests PFT 04/21/14 >> FEV1 2.58 (98%), FEV1% 73, TLC 6.45 (96%), DLCO 74%, no BD CT chest 12/27/15 >> patchy consolidation LLL, GGO 1.8 cm RUL, patchy GGO RML and RLL CT chest 06/27/16 >> GGO's resolved, no change in nodules  Past Medical History: He  has a past medical history of Actinic keratoses, Arthritis, Colon polyps, Dyspnea (07/13/2013), Edema (07/13/2013), GERD (gastroesophageal reflux disease), H/O TIA (transient ischemic attack) and stroke, History of BPH, History of TIA (transient ischemic attack) (07/13/2013), Lumbar radiculopathy, Need for shingles vaccine, Optic neuritis (10/2015), Perennial allergic rhinitis, Polymyalgia rheumatica ([place]) (07/13/2013), Restless leg syndrome, Retinal disease, Shingles, and Temporal arteritis ([place]) (10/2015).  Past Surgical History: He  has a past surgical history that includes Vasectomy; Colonoscopy; drropy eyelid surgery; Esophagogastroduodenoscopy (egd) with propofol (N/A, 04/18/2016); and BRAVO ph study (N/A, 04/18/2016).  Family History: His family history includes Breast cancer in his mother; Coronary artery disease in his father and mother; Diabetes in his father and mother; Berenice Primas' disease in his sister; Heart attack in his father; Stroke in his mother.  Social History: He  reports that he quit smoking about 42 years ago. His smoking use included cigarettes. He has a 15.00 pack-year smoking history. He has never used smokeless tobacco. He reports that he drinks about 4.2 oz of alcohol per  week. He reports that he does not use drugs.  Medications: Allergies as of 01/01/2018   No Known Allergies     Medication List        Accurate as of 01/01/18 10:35 AM. Always use your most recent med list.          acetaminophen 325 MG tablet Commonly known as:  TYLENOL Take 650 mg by mouth every 6 (six) hours as needed for mild pain.   cetirizine 10 MG tablet Commonly known as:   ZYRTEC Take 10 mg by mouth daily as needed. Reported on 12/30/2015   clopidogrel 75 MG tablet Commonly known as:  PLAVIX Take 75 mg by mouth daily with breakfast.   dextromethorphan-guaiFENesin 30-600 MG 12hr tablet Commonly known as:  MUCINEX DM Take 1 tablet by mouth 2 (two) times daily.   doxazosin 8 MG tablet Commonly known as:  CARDURA Take 8 mg by mouth daily.   famotidine 20 MG tablet Commonly known as:  PEPCID Take 20 mg by mouth at bedtime.   fluticasone 27.5 MCG/SPRAY nasal spray Commonly known as:  VERAMYST Place 2 sprays into the nose daily as needed.   furosemide 20 MG tablet Commonly known as:  LASIX Take 1 tablet (20 mg total) by mouth daily.   MULTI-VITAMINS Tabs Take 1 tablet by mouth daily.   omeprazole 20 MG capsule Commonly known as:  PRILOSEC Take 1 capsule (20 mg total) by mouth daily.   simvastatin 20 MG tablet Commonly known as:  ZOCOR Take 20 mg by mouth daily.

## 2018-01-01 NOTE — Patient Instructions (Signed)
Lasix 40 mg twice per day until next follow up appointmet Chest and lab tests today Monitor your weight daily  Follow up on 01/03/18 with Nurse Practitioner

## 2018-01-02 ENCOUNTER — Telehealth: Payer: Self-pay | Admitting: Internal Medicine

## 2018-01-02 ENCOUNTER — Telehealth: Payer: Self-pay | Admitting: Pulmonary Disease

## 2018-01-02 NOTE — Telephone Encounter (Signed)
Wife called with concern that husband's breathing hasn't improved despite 4 lb weight loss from increased lasix directed by Dr [name]. Known pleural effusion for f/u with NP tomorrow (Friday) AM. I suggested they continue the course for now, pending reassessment in AM, as long as he is not worse. She had researched and wonders if he might have Yellow Nail Syndrome.

## 2018-01-02 NOTE — Progress Notes (Signed)
_0  ID: Stuart Watson, male    DOB: 04/28/1932, 84 y.o.   MRN: [id number]  Chief Complaint  Patient presents with  . Follow-up    continues to have SOB, wheezing states wheezing is worse at night, denies chest pain/discomfort. Increased dry cough.     Referring provider: Wenda Low, MD   HPI: 84 year old male former smoker with chronic cough and lung nodules (stable 2014 10/10/2015).  And recurrent pneumonia. Dr. [name] patient for previous aspiration pneumonitis.  Past medical history- temporal arteritis optic neuritis, GERD (on PPI and H2), AR  Recent [facility] Encounters:   12/30/2015- appointment with [name] Had been seen since 2015 with Dr. clients for cough.  Said that sometimes he gets a cough and feels like things get stuck in his throat. At this time was thought to have recurrent aspiration which was leaving leading to development of pneumonitis  02/22/2016-follow-up for aspiration pneumonia Patient return to office is feeling better reporting cough has improved.  Has been off steroids for 2 weeks.  Will get follow-up CT in 6 months.  06/29/2016-office visit >>>Seen by Dr. [name] instructed to follow-up in a year stable and doing well.  CT chest reviewed GGO is resolved.  Nodule stable.  11/13/2017-acute visit >>>Patient states that reporting today because for the last 6 months they have not been feeling as well.  Feels activity tolerance is decreasing gets more winded easily.  Reporting daily dry cough.  As well as chronic ankle edema.  Taking Lasix daily.  Being followed by GI for GERD.  Chest x-ray at appointment reveals potential pneumonia.  Started on Z-Pak. Hyponatremic with labs.   11/22/17 office visit pneumonia hyponatremia >>>Patient presents from a one-week follow-up still has a progressive cough and shortness of breath over the last 6 months.  Patient is supposed to be on Lasix 20 mg daily but reports he is only taking 1/2 tablet to decrease his urination  issues.  He continues to have chronic leg swelling.  Chest x-ray today shows no improvement with left-sided atelectatic changes and effusion.  Patient was initially treated with a Z-Pak.  Patient started on Levaquin (11/22/17).  The patient's hyponatremia has improved.  BNP at this point in time is normal.   11/29/2017 OV >>>Patient returns to office today after complaints of continued shortness of breath, fatigue, and no improvement after 2 rounds of antibiotics (Z-Pak, Levaquin).  Patient states he noticed improvement in edema with 3-day trial of 40 mg of Lasix since last appointment.  Patient states edema returned as soon as he resume 20 mg daily.  Patient feels that he is actually feeling worse than how he felt prior to the Levaquin being started.  Wife mentions during ROS in initial patient interview the patient has been having continued episodes of feeling like he is choking while eating and choking when he coughs. Pt to get stat chest x-ray today in office.   12/13/17  HFU  Pt presents to office today for HFU and scheduled follow up. CXRY today shows new small left effusion. Pt reports clinically feeling much better, able to work out 2x this week.  Patient also reporting that he can breathe much better, as well as not having any orthopnea.  Patient reporting they completed a modified barium swallow eval. Patient will also follow-up with Dr. [name] for the already planned appointment on 01/01/2018 for pulmonary function as well as office visit follow-up.  01/01/2018-shortness of breath-office-sedated Patient was supposed to complete pulmonary function test prior to  office visit today.  Patient was having difficulty breathing.  Patient has gained 7 pounds in 6 days.  Patient has increased productive cough green and yellow mucus, and wheezing. Acute CHF exacerbation with recurrent pleural effusions, increase Lasix to 40 mg twice daily.  Chest x-ray today, labs today, monitor weight, might need repeat  thoracentesis, if no improvement gets worse go to the hospital.  Follow-up on 6/14 with nurse practitioner.     Tests:  PFT 04/21/2014--FEV1 2.18 (98%), FEV1 73, TLC 6.45 (96%), DLCO 74%, no BD  Imaging:  CT chest 12/27/2015-test patchy consolidation LLL, GGO space 1.9 cm R UL, patchy GGO RML and RLL  CT chest 06/27/2016--GGO is resolved, no change in nodules  11/13/2017-chest x-ray-small pleural effusions and lower lobe opacity favoring atelectasis  11/22/2017- chest x-ray- little change from 4/24 x-ray, minimal improvement in the pleural effusions, pneumonia at left lung base is definite consideration 12/05/2017-CT chest with contrast- moderately large bilateral pleural effusions which appear free-flowing, resulting in partial atelectasis in both lower lobes >>>small noncalcified lung nodules with a calcified granuloma in the right middle lobe these changes are most likely postinflammatory or postinfectious in origin >>>small pericardial effusion 12/08/2017-chest x-ray- no significant residual pleural fluid, no evidence of pneumothorax or reexpansion edema, no acute findings post thoracentesis 12/13/2017-chest x-ray- bilateral small pleural effusion 01/01/2018-chest x-ray-some increase in bilateral pleural effusions  Cardiac:  Echo 07/27/2013--EF 60 to 65% 12/04/2017- echo-grade 1 diastolic dysfunction, EF 16-10 percent   Labs:  11/13/17 - Sodium level  128 11/22/17 - Sodium level 132-hyponatremia resolving 11/13/17-BNP-31 12/06/2017-BNP-45.5 12/07/2017-protein electrophoresis-total protein low 5.6 01/01/2018- sodium 128 01/01/2018-bnp 60  Micro:  12/07/2017- fungal sputum culture-no fungus observed  12/07/17 - Sputum culture -rare squamous epithelial cells present, few gram-positive cocci in chains   Chart Review:  GI Tests: 04/18/16 - Upper Endoscopy and [facility]    12/06/2017-hospitalization-pleural effusion, discharge 12/08/2017 >>>Follow-up on pleural effusion fluid as well as sputum culture  results >>>12/07/2017- right sided thoracentesis performed-1.7 L removed fluid consistent with transudate, 960 WBCs with 57% lymphs, Gram stain does not show any organisms, No malignant cells identified in right pleural fluid >>>12/08/2017- left-sided thoracentesis done-1.7 L again removed >>>Hyponatremia present on admission to hospital, given 1 L normal saline to help bring up he is asymptomatic  12/11/2017-swallow eval- mild aspiration risk no treatment changes at this time, regular solids and thin liquid         01/03/18 OV  Pleasant patient today presenting today for follow-up.  Patient reporting that they have been adherent to their Lasix and have noticed that his weight is decreasing.  Weight is down 6 pounds since appointment with Dr. [name].  Chest x-ray today still showing persistent pleural effusions.  Patient and spouse both concerned of worsening respiratory symptoms, shortness of breath, cough, difficulty laying flat.   Wife specifically wanting to present to emergency room to get both lungs drained today.  Hyponatremia present on labs done on 01/01/2018.  This is chronic as we have seen on recent lab work.  No Known Allergies  Immunization History  Administered Date(s) Administered  . Influenza, High Dose Seasonal PF 04/22/2017  . Influenza,inj,Quad PF,6+ Mos 04/23/2015  . Pneumococcal Conjugate-13 07/23/2016  . Pneumococcal Polysaccharide-23 11/13/2012    Past Medical History:  Diagnosis Date  . Actinic keratoses   . Arthritis   . Colon polyps   . Dyspnea 07/13/2013   BNP normal  . Edema 07/13/2013  . GERD (gastroesophageal reflux disease)    omeprazole 1-2 times  daily  . H/O TIA (transient ischemic attack) and stroke    on statins and plavix -saw Dr.Sethi in the past  . History of BPH   . History of TIA (transient ischemic attack) 07/13/2013  . Lumbar radiculopathy   . Need for shingles vaccine   . Optic neuritis 10/2015   left eye  . Perennial allergic  rhinitis   . Polymyalgia rheumatica ([place]) 07/13/2013   Daily prednisone  . Restless leg syndrome   . Retinal disease    right eye since birth   . Shingles   . Temporal arteritis ([place]) 10/2015    Tobacco History: Social History   Tobacco Use  Smoking Status Former Smoker  . Packs/day: 1.00  . Years: 15.00  . Pack years: 15.00  . Types: Cigarettes  . Last attempt to quit: 07/23/1975  . Years since quitting: 42.4  Smokeless Tobacco Never Used   Counseling given: Not Answered Continue not smoking.  Outpatient Encounter Medications as of 01/03/2018  Medication Sig  . acetaminophen (TYLENOL) 325 MG tablet Take 650 mg by mouth every 6 (six) hours as needed for mild pain.   . cetirizine (ZYRTEC) 10 MG tablet Take 10 mg by mouth daily as needed. Reported on 12/30/2015  . clopidogrel (PLAVIX) 75 MG tablet Take 75 mg by mouth daily with breakfast.  . dextromethorphan-guaiFENesin (MUCINEX DM) 30-600 MG 12hr tablet Take 1 tablet by mouth 2 (two) times daily.  Marland Kitchen doxazosin (CARDURA) 8 MG tablet Take 8 mg by mouth daily.  . famotidine (PEPCID) 20 MG tablet Take 20 mg by mouth at bedtime.  . fluticasone (VERAMYST) 27.5 MCG/SPRAY nasal spray Place 2 sprays into the nose daily as needed.   . furosemide (LASIX) 20 MG tablet Take 1 tablet (20 mg total) by mouth daily.  . Multiple Vitamin (MULTI-VITAMINS) TABS Take 1 tablet by mouth daily.  Marland Kitchen omeprazole (PRILOSEC) 20 MG capsule Take 1 capsule (20 mg total) by mouth daily.  . simvastatin (ZOCOR) 20 MG tablet Take 20 mg by mouth daily.   No facility-administered encounter medications on file as of 01/03/2018.      Review of Systems  Constitutional: +fatigue  No  weight loss, night sweats,  fevers, chills HEENT:   No headaches,  Difficulty swallowing,  Tooth/dental problems, or  Sore throat, No sneezing, itching, ear ache, nasal congestion, post nasal drip  CV: +LE swelling improved, No chest pain,  orthopnea, PND, anasarca, dizziness,  palpitations, syncope  GI: No heartburn, indigestion, abdominal pain, nausea, vomiting, diarrhea, change in bowel habits, loss of appetite, bloody stools Resp: +sob worsening, orthopnea, dry cough  No shortness of breath at rest.  No excess mucus, no productive cough, No coughing up of blood.  No change in color of mucus.  No wheezing.  No chest wall deformity Skin: no rash, lesions, no skin changes. GU: no dysuria, change in color of urine, no urgency or frequency.  No flank pain, no hematuria  MS:  No joint pain or swelling.  No decreased range of motion.  No back pain. Psych:  No change in mood or affect. No depression or anxiety.  No memory loss.   Physical Exam  BP 108/70   Pulse 77   Ht _0  (1.676 m)   Wt 157 lb 12.8 oz (71.6 kg)   SpO2 93%   BMI 25.47 kg/m   Wt Readings from Last 3 Encounters:  01/03/18 157 lb 12.8 oz (71.6 kg)  01/01/18 163 lb (73.9 kg)  12/13/17 153  lb 9.6 oz (69.7 kg)  >>> weight improved    GEN: A/Ox3; pleasant , NAD, well nourished    HEENT:  [place]/AT, NOSE-clear, THROAT- +post nasal drip, no lesions   NECK:  Supple w/ fair ROM; no JVD; normal carotid impulses w/o bruits; no lymphadenopathy.    RESP:  +diminished in bases bilaterally, air movement in other lobes, without wheeze,  no accessory muscle use, no dullness to percussion  CARD:  +1 LE edema bilaterally, RRR, no m/r/g, pulses intact, no cyanosis or clubbing.  GI:   Soft & nt; nml bowel sounds; no organomegaly or masses detected.   Musco: Warm bil, no deformities or joint swelling noted.   Neuro: alert, no focal deficits noted.    Skin: Warm, no lesions or rashes    Lab Results:  CBC    Component Value Date/Time   WBC 4.5 01/01/2018 1103   RBC 4.19 (L) 01/01/2018 1103   HGB 13.6 01/01/2018 1103   HCT 40.3 01/01/2018 1103   PLT 307.0 01/01/2018 1103   MCV 96.3 01/01/2018 1103   MCH 31.7 12/07/2017 0400   MCHC 33.8 01/01/2018 1103   RDW 13.8 01/01/2018 1103   LYMPHSABS 1.0  01/01/2018 1103   MONOABS 0.5 01/01/2018 1103   EOSABS 0.0 01/01/2018 1103   BASOSABS 0.0 01/01/2018 1103    BMET    Component Value Date/Time   NA 128 (L) 01/01/2018 1103   K 4.4 01/01/2018 1103   CL 97 01/01/2018 1103   CO2 27 01/01/2018 1103   GLUCOSE 86 01/01/2018 1103   BUN 17 01/01/2018 1103   CREATININE 0.78 01/01/2018 1103   CALCIUM 8.2 (L) 01/01/2018 1103   GFRNONAA >60 12/08/2017 1259   GFRAA >60 12/08/2017 1259    BNP    Component Value Date/Time   BNP 45.5 12/06/2017 1412    ProBNP    Component Value Date/Time   PROBNP 60.0 01/01/2018 1103    Imaging: Dg Chest 1 View  Result Date: 12/08/2017 CLINICAL DATA:  Thoracentesis EXAM: CHEST  1 VIEW COMPARISON:  Yesterday FINDINGS: No significant residual pleural fluid. No evidence of pneumothorax or re-expansion edema. Mild atelectatic type opacities at the bases. Normal heart size and mediastinal contours. IMPRESSION: No acute finding after thoracentesis.  No visible residual fluid. Electronically Signed   By: Monte Fantasia M.D.   On: 12/08/2017 12:42   Dg Chest 1 View  Result Date: 12/07/2017 CLINICAL DATA:  Patient status post right thoracentesis. EXAM: CHEST  1 VIEW COMPARISON:  Chest radiograph 11/29/2017 FINDINGS: Monitoring leads overlie the patient. Stable cardiomegaly. Interval resolution of right pleural effusion. Persistent moderate left pleural effusion. Heterogeneous opacities left lung base. No pneumothorax. Thoracic spine degenerative changes. IMPRESSION: Interval resolution right pleural effusion. Persistent moderate left pleural effusion with underlying atelectasis. Electronically Signed   By: Lovey Newcomer M.D.   On: 12/07/2017 15:52   Dg Chest 2 View  Result Date: 01/03/2018 CLINICAL DATA:  Pleural effusion EXAM: CHEST - 2 VIEW COMPARISON:  01/01/2018 FINDINGS: Small bilateral pleural effusions with bibasilar atelectasis, stable since prior study. Heart is likely normal in size. No acute bony  abnormality. IMPRESSION: Stable bilateral effusions and bibasilar atelectasis. Electronically Signed   By: Rolm Baptise M.D.   On: 01/03/2018 10:32   Dg Chest 2 View  Result Date: 01/01/2018 CLINICAL DATA:  History of bilateral pleural effusions. Increasing shortness of breath. EXAM: CHEST - 2 VIEW COMPARISON:  PA and lateral chest 12/13/2017. FINDINGS: Small bilateral pleural effusions and basilar airspace  disease have worsened since the prior examination. There is no evidence of pulmonary edema. Cardiac silhouette is obscured. No pneumothorax. No focal bony abnormality. IMPRESSION: Some increase in small bilateral pleural effusions and basilar airspace disease, likely atelectasis. Electronically Signed   By: Inge Rise M.D.   On: 01/01/2018 13:02   Dg Chest 2 View  Result Date: 12/13/2017 CLINICAL DATA:  84 year old male with a history of bilateral pleural effusions. EXAM: CHEST - 2 VIEW COMPARISON:  12/08/2017, 12/07/2017, CT 12/04/2017 FINDINGS: Cardiomediastinal silhouette unchanged in size and contour. Opacities at the bilateral lung bases somewhat obscure the bilateral heart borders and hemidiaphragms. The left sided opacity is increased since the prior plain film of 12/08/2017. Blunting of the costophrenic sulcus on the lateral view. Thickening of the fissures. No pneumothorax. No confluent airspace disease. No interlobular septal thickening. IMPRESSION: Bilateral small pleural effusions. The left appears to have recurred after prior thoracentesis 12/08/2017. Electronically Signed   By: Corrie Mckusick D.O.   On: 12/13/2017 13:51   Ct Chest W Contrast  Result Date: 12/05/2017 CLINICAL DATA:  Short of breath, coughing, wheezing for 3-4 weeks EXAM: CT CHEST WITH CONTRAST TECHNIQUE: Multidetector CT imaging of the chest was performed during intravenous contrast administration. CONTRAST:  15m ISOVUE-300 IOPAMIDOL (ISOVUE-300) INJECTION 61% COMPARISON:  Chest x-ray of 11/29/2016 and 11/13/2016  FINDINGS: Cardiovascular: There is good opacification of the thoracic aorta and the pulmonary arteries. No acute abnormality is seen. Moderate thoracic aortic atherosclerosis is present. There are diffuse coronary artery calcifications. The heart is within normal limits in size and there is a small pericardial effusion noted. Mediastinum/Nodes: No mediastinal or hilar adenopathy seen with only small mediastinal lymph nodes present. The thyroid gland is unremarkable. No definite hiatal hernia is seen. Lungs/Pleura: There are moderately large bilateral pleural effusions present which appear free-flowing. As result of these effusions, there is partial atelectasis of both lower lobes. No definite pneumonia is seen. A 7 mm pleural-based nodule is present in the right lower lobe laterally on image 100 series 3. A 6 mm noncalcified subpleural nodule is present in the right middle lobe anterolaterally on image 128. A small calcified granuloma is present within the right middle lobe on image 99. No additional lung nodule is seen. These nodules most likely are postinflammatory possibly due to prior granulomatous disease. The central airway is patent. Upper Abdomen: No abnormality of the upper abdomen is noted on the few images obtained other than probable diffuse fluid overload with strandiness of the soft tissues diffusely. Musculoskeletal: The thoracic vertebrae are in normal alignment with diffuse degenerative change. No compression deformity is seen. IMPRESSION: 1. Moderately large bilateral pleural effusions which appear free-flowing, resulting in partial atelectasis of both lower lobes. No definite pneumonia. 2. Small noncalcified lung nodules with a calcified granuloma in the right middle lobe. These changes most likely are postinflammatory or post infectious in origin. 3. Moderate thoracic aortic atherosclerosis and diffuse coronary artery calcifications. 4. Small pericardial effusion. Electronically Signed   By: PIvar DrapeM.D.   On: 12/05/2017 08:47   Dg Op Swallowing Func-medicare/speech Path  Result Date: 12/11/2017 Objective Swallowing Evaluation: Type of Study: MBS-Modified Barium Swallow Study  Patient Details Name: JTYQUAVIOUS GAMELMRN: [id number] of Birth: [id number]'s Date: 12/11/2017 Time: SLP Start Time (ACUTE ONLY): 1310 -SLP Stop Time (ACUTE ONLY): 1325 SLP Time Calculation (min) (ACUTE ONLY): 15 min Past Medical History: Past Medical History: Diagnosis Date . Actinic keratoses  . Arthritis  . Colon polyps  .  Dyspnea 07/13/2013  BNP normal . Edema 07/13/2013 . GERD (gastroesophageal reflux disease)   omeprazole 1-2 times daily . H/O TIA (transient ischemic attack) and stroke   on statins and plavix -saw Dr.Sethi in the past . History of BPH  . History of TIA (transient ischemic attack) 07/13/2013 . Lumbar radiculopathy  . Need for shingles vaccine  . Optic neuritis 10/2015  left eye . Perennial allergic rhinitis  . Polymyalgia rheumatica ([place]) 07/13/2013  Daily prednisone . Restless leg syndrome  . Retinal disease   right eye since birth  . Shingles  . Temporal arteritis ([place]) 10/2015 Past Surgical History: Past Surgical History: Procedure Laterality Date . BRAVO [place]­o STUDY N/A 04/18/2016  Procedure: BRAVO [place];  Surgeon: Wilford Corner, MD;  Location: [facility];  Service: Endoscopy;  Laterality: N/A; . COLONOSCOPY   . drropy eyelid surgery   . ESOPHAGOGASTRODUODENOSCOPY (EGD) WITH PROPOFOL N/A 04/18/2016  Procedure: ESOPHAGOGASTRODUODENOSCOPY (EGD) WITH PROPOFOL;  Surgeon: Wilford Corner, MD;  Location: [facility];  Service: Endoscopy;  Laterality: N/A; . VASECTOMY   HPI: 84 yo male referred for MBS due to pt complaint of dysphagia and concern for recent aspiration pneumonitis.  Pt PMH + for GERD, TIA, CAD, bilateral pleural effusion, CHF, temporal arteritis - optic neuritis, lung nodules, post nasal drip, PMR, shingles.  Pt on PPI, H2 blocker.  Pt is s/p thoracentesis 12/08/2017.  He admits to  having problems with "mucus" but denies severe problems swallowing. Pt states he has undergone endoscopy and ? pH probe in the past with negative results.   Subjective: pt awake in chair Assessment / Plan / Recommendation CHL IP CLINICAL IMPRESSIONS 12/11/2017 Clinical Impression Pt presents with normal oropharyngeal swallow ability.  No aspiration/penetration or significant residuals during intake - despite being taxed to consume sequential liquid boluses.  Pt did appear with prominent cricophayrngeus (only viewed x1 when taking barium tablet) that did not impair barium flow.  Pt does report xerostomia.  Of note, pt admits to improved mucus management since he started taking his PPI again recently.   SLP Visit Diagnosis Dysphagia, unspecified (R13.10) Attention and concentration deficit following -- Frontal lobe and executive function deficit following -- Impact on safety and function Mild aspiration risk   CHL IP TREATMENT RECOMMENDATION 12/11/2017 Treatment Recommendations No treatment recommended at this time   No flowsheet data found. CHL IP DIET RECOMMENDATION 12/11/2017 SLP Diet Recommendations Regular solids;Thin liquid Liquid Administration via Cup;Straw Medication Administration Whole meds with liquid Compensations Slow rate;Small sips/bites Postural Changes Remain semi-upright after after feeds/meals (Comment);Seated upright at 90 degrees   CHL IP OTHER RECOMMENDATIONS 12/11/2017 Recommended Consults -- Oral Care Recommendations Oral care BID Other Recommendations --   No flowsheet data found.  No flowsheet data found.     CHL IP ORAL PHASE 12/11/2017 Oral Phase WFL Oral - Pudding Teaspoon -- Oral - Pudding Cup -- Oral - Honey Teaspoon -- Oral - Honey Cup -- Oral - Nectar Teaspoon -- Oral - Nectar Cup WFL Oral - Nectar Straw -- Oral - Thin Teaspoon -- Oral - Thin Cup WFL Oral - Thin Straw WFL Oral - Puree WFL Oral - Mech Soft NT Oral - Regular WFL Oral - Multi-Consistency -- Oral - Pill WFL Oral Phase -  Comment --  CHL IP PHARYNGEAL PHASE 12/11/2017 Pharyngeal Phase WFL Pharyngeal- Pudding Teaspoon -- Pharyngeal -- Pharyngeal- Pudding Cup -- Pharyngeal -- Pharyngeal- Honey Teaspoon -- Pharyngeal -- Pharyngeal- Honey Cup -- Pharyngeal -- Pharyngeal- Nectar Teaspoon -- Pharyngeal -- Pharyngeal- Nectar Cup  WFL Pharyngeal -- Pharyngeal- Nectar Straw -- Pharyngeal -- Pharyngeal- Thin Teaspoon -- Pharyngeal -- Pharyngeal- Thin Cup WFL Pharyngeal -- Pharyngeal- Thin Straw WFL Pharyngeal -- Pharyngeal- Puree WFL Pharyngeal -- Pharyngeal- Mechanical Soft -- Pharyngeal -- Pharyngeal- Regular WFL Pharyngeal -- Pharyngeal- Multi-consistency -- Pharyngeal -- Pharyngeal- Pill WFL Pharyngeal -- Pharyngeal Comment --  CHL IP CERVICAL ESOPHAGEAL PHASE 12/11/2017 Cervical Esophageal Phase WFL Pudding Teaspoon -- Pudding Cup -- Honey Teaspoon -- Honey Cup -- Nectar Teaspoon -- Nectar Cup WFL Nectar Straw -- Thin Teaspoon -- Thin Cup WFL;Prominent cricopharyngeal segment Thin Straw WFL Puree WFL Mechanical Soft -- Regular WFL Multi-consistency -- Pill WFL Cervical Esophageal Comment -- No flowsheet data found. Luanna Salk, MS [facility] SLP [phone] Macario Golds 12/11/2017, 1:56 PM            CLINICAL DATA:  Dysphagia. EXAM: MODIFIED BARIUM SWALLOW TECHNIQUE: Different consistencies of barium were administered orally to the patient by the Speech Pathologist. Imaging of the pharynx was performed in the lateral projection. FLUOROSCOPY TIME:  Fluoroscopy Time:  54 seconds Radiation Exposure Index (if provided by the fluoroscopic device): 4.7 mGy COMPARISON:  None. FINDINGS: Thin liquid- within normal limits Nectar thick liquid- within normal limits Pure- within normal limits Pure with cracker- within normal limits Barium tablet -  within normal limits IMPRESSION: Normal exam. Please refer to the Speech Pathologists report for complete details and recommendations. Electronically Signed   By: Lorriane Shire M.D.   On: 12/11/2017 13:47    US Thoracentesis Asp Pleural Space W/img Guide  Result Date: 12/08/2017 INDICATION: Patient with history of CHF, dyspnea, bilateral pleural effusions, status post right thoracentesis on 12/07/2017. Request now received for therapeutic left thoracentesis. EXAM: ULTRASOUND GUIDED THERAPEUTIC LEFT THORACENTESIS MEDICATIONS: None COMPLICATIONS: None immediate. PROCEDURE: An ultrasound guided thoracentesis was thoroughly discussed with the patient and questions answered. The benefits, risks, alternatives and complications were also discussed. The patient understands and wishes to proceed with the procedure. Written consent was obtained. Ultrasound was performed to localize and mark an adequate pocket of fluid in the left chest. The area was then prepped and draped in the normal sterile fashion. 1% Lidocaine was used for local anesthesia. Under ultrasound guidance a 6 Fr Safe-T-Centesis catheter was introduced. Thoracentesis was performed. The catheter was removed and a dressing applied. FINDINGS: A total of approximately 1.7 liters of yellow fluid was removed. IMPRESSION: Successful ultrasound guided therapeutic left thoracentesis yielding 1.7 liters of pleural fluid. Read by: Rowe Robert, PA-C No pneumothorax on follow-up radiograph. Electronically Signed   By: Lucrezia Europe M.D.   On: 12/08/2017 12:13   US Thoracentesis Asp Pleural Space W/img Guide  Result Date: 12/07/2017 INDICATION: Patient with history of CHF, dyspnea, bilateral pleural effusions, right greater than left. Request made for diagnostic and therapeutic right thoracentesis. EXAM: ULTRASOUND GUIDED DIAGNOSTIC AND THERAPEUTIC RIGHT THORACENTESIS MEDICATIONS: None COMPLICATIONS: None immediate. PROCEDURE: An ultrasound guided thoracentesis was thoroughly discussed with the patient and questions answered. The benefits, risks, alternatives and complications were also discussed. The patient understands and wishes to proceed with the procedure. Written  consent was obtained. Ultrasound was performed to localize and mark an adequate pocket of fluid in the right chest. The area was then prepped and draped in the normal sterile fashion. 1% Lidocaine was used for local anesthesia. Under ultrasound guidance a 6 Fr Safe-T-Centesis catheter was introduced. Thoracentesis was performed. The catheter was removed and a dressing applied. FINDINGS: A total of approximately 1.8 liters of yellow fluid was removed. Samples were  sent to the laboratory as requested by the clinical team. IMPRESSION: Successful ultrasound guided diagnostic and therapeutic right thoracentesis yielding 1.8 liters of pleural fluid. Read by: Rowe Robert, PA-C Electronically Signed   By: Lucrezia Europe M.D.   On: 12/07/2017 15:33     Assessment & Plan:   84 year old patient seen in office today.  Unfortunately patient has bilateral pleural effusion seen on chest x-ray that have worsened since 5/24 chest x-ray.  Even with aggressive diuresis from Lasix weight is down 6 pounds.  Pleural effusions remain, and clinical symptoms are present and increasing per pt and spouse.  O2 saturations 94%.  Clinically patient appears stable but will order a outpatient diagnostic and therapeutic thoracentesis today.    We will send fluid for analysis and labs.  Will have patient follow-up in 2 weeks.  Patient will need repeat chest x-ray at that appointment as well as repeat c-Met.   Bilateral pleural effusion Will order diagnostic and therapeutic thoracentesis to be performed today  >>> We will send fluid for analysis We will order this stat and try to get it completed today, with another to be done next week If these continue to be chronic we can refer you to cardiothoracic surgery for chronic management of pleural effusions  Continue Lasix  Follow-up in 2 weeks with repeat chest x-ray, and lab work  Dyspnea X-ray today We will order diagnostic and therapeutic thoracentesis   Hyponatremia Repeat  c-Met at next appointment     Lauraine Rinne, NP 01/03/2018

## 2018-01-02 NOTE — Telephone Encounter (Signed)
Dg Chest 2 View  Result Date: 01/01/2018 CLINICAL DATA:  History of bilateral pleural effusions. Increasing shortness of breath. EXAM: CHEST - 2 VIEW COMPARISON:  PA and lateral chest 12/13/2017. FINDINGS: Small bilateral pleural effusions and basilar airspace disease have worsened since the prior examination. There is no evidence of pulmonary edema. Cardiac silhouette is obscured. No pneumothorax. No focal bony abnormality. IMPRESSION: Some increase in small bilateral pleural effusions and basilar airspace disease, likely atelectasis. Electronically Signed   By: Inge Rise M.D.   On: 01/01/2018 13:02     BMP Latest Ref Rng & Units 01/01/2018 12/13/2017 12/08/2017  Glucose 70 - 99 mg/dL 86 103(H) 101(H)  BUN 6 - 23 mg/dL 17 18 14   Creatinine 0.40 - 1.50 mg/dL 0.78 0.83 0.86  Sodium 135 - 145 mEq/L 128(L) 130(L) 127(L)  Potassium 3.5 - 5.1 mEq/L 4.4 4.3 4.5  Chloride 96 - 112 mEq/L 97 98 95(L)  CO2 19 - 32 mEq/L 27 26 25   Calcium 8.4 - 10.5 mg/dL 8.2(L) 8.3(L) 7.9(L)    CBC Latest Ref Rng & Units 01/01/2018 12/13/2017 12/07/2017  WBC 4.0 - 10.5 K/uL 4.5 6.2 6.8  Hemoglobin 13.0 - 17.0 g/dL 13.6 13.5 12.2(L)  Hematocrit 39.0 - 52.0 % 40.3 39.9 35.6(L)  Platelets 150.0 - 400.0 K/uL 307.0 357.0 276    ProBNP (last 3 results) Recent Labs    11/13/17 1150 01/01/18 1103  PROBNP 31.0 60.0     Tried to call pt.  CXR shows some increase in effusions, Na lower.  Has appointment on 01/03/18; can review with pt then and determine if he needs thoracentesis.

## 2018-01-03 ENCOUNTER — Ambulatory Visit (INDEPENDENT_AMBULATORY_CARE_PROVIDER_SITE_OTHER)
Admission: RE | Admit: 2018-01-03 | Discharge: 2018-01-03 | Disposition: A | Discharge status: Home / Self Care | Payer: Medicare Other | Source: Ambulatory Visit | Attending: Pulmonary Disease | Admitting: Pulmonary Disease

## 2018-01-03 ENCOUNTER — Ambulatory Visit (HOSPITAL_COMMUNITY)
Admission: RE | Admit: 2018-01-03 | Discharge: 2018-01-03 | Disposition: A | Discharge status: Home / Self Care | Payer: Medicare Other | Source: Ambulatory Visit | Attending: Pulmonary Disease | Admitting: Pulmonary Disease

## 2018-01-03 ENCOUNTER — Encounter: Payer: Self-pay | Admitting: Pulmonary Disease

## 2018-01-03 ENCOUNTER — Ambulatory Visit (HOSPITAL_COMMUNITY)
Admission: RE | Admit: 2018-01-03 | Discharge: 2018-01-03 | Disposition: A | Discharge status: Home / Self Care | Payer: Medicare Other | Source: Ambulatory Visit | Attending: Radiology | Admitting: Radiology

## 2018-01-03 ENCOUNTER — Encounter (HOSPITAL_COMMUNITY): Payer: Medicare Other

## 2018-01-03 ENCOUNTER — Ambulatory Visit (INDEPENDENT_AMBULATORY_CARE_PROVIDER_SITE_OTHER): Payer: Medicare Other | Admitting: Pulmonary Disease

## 2018-01-03 VITALS — BP 108/70 | HR 77 | Ht 66.0 in | Wt 157.8 lb

## 2018-01-03 DIAGNOSIS — R0602 Shortness of breath: Secondary | ICD-10-CM

## 2018-01-03 DIAGNOSIS — E871 Hypo-osmolality and hyponatremia: Secondary | ICD-10-CM

## 2018-01-03 DIAGNOSIS — Z9889 Other specified postprocedural states: Secondary | ICD-10-CM | POA: Diagnosis not present

## 2018-01-03 DIAGNOSIS — J90 Pleural effusion, not elsewhere classified: Secondary | ICD-10-CM

## 2018-01-03 DIAGNOSIS — J9811 Atelectasis: Secondary | ICD-10-CM | POA: Diagnosis not present

## 2018-01-03 DIAGNOSIS — R846 Abnormal cytological findings in specimens from respiratory organs and thorax: Secondary | ICD-10-CM | POA: Diagnosis not present

## 2018-01-03 LAB — BODY FLUID CELL COUNT WITH DIFFERENTIAL
Eos, Fluid: 0 %
LYMPHS FL: 64 %
Monocyte-Macrophage-Serous Fluid: 36 % — ABNORMAL LOW (ref 50–90)
Neutrophil Count, Fluid: 0 % (ref 0–25)
Total Nucleated Cell Count, Fluid: 627 cu mm (ref 0–1000)

## 2018-01-03 LAB — GRAM STAIN

## 2018-01-03 LAB — LACTATE DEHYDROGENASE, PLEURAL OR PERITONEAL FLUID: LD FL: 76 U/L — AB (ref 3–23)

## 2018-01-03 LAB — PROTEIN, PLEURAL OR PERITONEAL FLUID: TOTAL PROTEIN, FLUID: 3.4 g/dL

## 2018-01-03 LAB — AMYLASE, PLEURAL OR PERITONEAL FLUID: AMYLASE FL: 23 U/L

## 2018-01-03 LAB — GLUCOSE, PLEURAL OR PERITONEAL FLUID: Glucose, Fluid: 105 mg/dL

## 2018-01-03 LAB — ALBUMIN, PLEURAL OR PERITONEAL FLUID: ALBUMIN FL: 2 g/dL

## 2018-01-03 MED ORDER — LIDOCAINE HCL 1 % IJ SOLN
INTRAMUSCULAR | Status: AC
  Filled 2018-01-03: qty 20

## 2018-01-03 NOTE — Procedures (Signed)
PROCEDURE SUMMARY:  Successful US guided right thoracentesis. Yielded 2.0 L of clear yellow fluid. Pt tolerated procedure well. No immediate complications.  Specimen was sent for labs. CXR ordered.  Ascencion Dike PA-C 01/03/2018 12:56 PM

## 2018-01-03 NOTE — Progress Notes (Signed)
Reviewed and agree with assessment/plan.   Kingslee Dowse, MD [facility] 07/18/2016, 12:24 PM Pager:  [phone]  

## 2018-01-03 NOTE — Assessment & Plan Note (Signed)
Repeat c-Met at next appointment

## 2018-01-03 NOTE — Assessment & Plan Note (Addendum)
Will order diagnostic and therapeutic thoracentesis to be performed today  >>> We will send fluid for analysis We will order this stat and try to get it completed today, with another to be done next week If these continue to be chronic we can refer you to cardiothoracic surgery for chronic management of pleural effusions  Continue Lasix  Follow-up in 2 weeks with repeat chest x-ray, and lab work

## 2018-01-03 NOTE — Assessment & Plan Note (Signed)
X-ray today We will order diagnostic and therapeutic thoracentesis

## 2018-01-03 NOTE — Progress Notes (Signed)
Discussed results with patient in office.  No changes in plan of care.  [name] FNP

## 2018-01-03 NOTE — Patient Instructions (Addendum)
Chest x-ray today  Will order diagnostic and therapeutic thoracentesis. >>> Will work to get this ordered stat so we can hopefully complete one today >>> Ideally left today >>> And right next week  >>> We will send fluids for analysis follow-up with you with the results  Present to [facility] ([facility]) immediately.  >>>go to admitting  >>>they will take you to IR   We will have a follow-up in 2 weeks with chest x-ray and repeat lab work to monitor for hyponatremia   Continue taking your Lasix     Please contact the office if your symptoms worsen or you have concerns that you are not improving.   Thank you for choosing [facility] for your healthcare, and for allowing [place] to partner with you on your healthcare journey. I am thankful to be able to provide care to you today.   [name] FNP-C

## 2018-01-04 LAB — TRIGLYCERIDES, BODY FLUIDS: Triglycerides, Fluid: 13 mg/dL

## 2018-01-05 ENCOUNTER — Encounter: Payer: Self-pay | Admitting: Pulmonary Disease

## 2018-01-05 LAB — PH, BODY FLUID: PH, BODY FLUID: 7.6

## 2018-01-06 ENCOUNTER — Ambulatory Visit (HOSPITAL_COMMUNITY)
Admission: RE | Admit: 2018-01-06 | Discharge: 2018-01-06 | Disposition: A | Discharge status: Home / Self Care | Payer: Medicare Other | Source: Ambulatory Visit | Attending: Pulmonary Disease | Admitting: Pulmonary Disease

## 2018-01-06 ENCOUNTER — Telehealth: Payer: Self-pay | Admitting: Pulmonary Disease

## 2018-01-06 ENCOUNTER — Other Ambulatory Visit: Payer: Self-pay | Admitting: Pulmonary Disease

## 2018-01-06 ENCOUNTER — Ambulatory Visit (HOSPITAL_COMMUNITY)
Admission: RE | Admit: 2018-01-06 | Discharge: 2018-01-06 | Disposition: A | Discharge status: Home / Self Care | Payer: Medicare Other | Source: Ambulatory Visit | Attending: Radiology | Admitting: Radiology

## 2018-01-06 DIAGNOSIS — R846 Abnormal cytological findings in specimens from respiratory organs and thorax: Secondary | ICD-10-CM | POA: Diagnosis not present

## 2018-01-06 DIAGNOSIS — J90 Pleural effusion, not elsewhere classified: Secondary | ICD-10-CM | POA: Insufficient documentation

## 2018-01-06 DIAGNOSIS — Z9889 Other specified postprocedural states: Secondary | ICD-10-CM

## 2018-01-06 DIAGNOSIS — Z48813 Encounter for surgical aftercare following surgery on the respiratory system: Secondary | ICD-10-CM | POA: Diagnosis not present

## 2018-01-06 LAB — BODY FLUID CELL COUNT WITH DIFFERENTIAL
Eos, Fluid: 0 %
LYMPHS FL: 63 %
Monocyte-Macrophage-Serous Fluid: 32 % — ABNORMAL LOW (ref 50–90)
Neutrophil Count, Fluid: 5 % (ref 0–25)
Total Nucleated Cell Count, Fluid: 990 cu mm (ref 0–1000)

## 2018-01-06 LAB — PROTEIN, PLEURAL OR PERITONEAL FLUID: TOTAL PROTEIN, FLUID: 3.4 g/dL

## 2018-01-06 LAB — LACTATE DEHYDROGENASE, PLEURAL OR PERITONEAL FLUID: LD FL: 63 U/L — AB (ref 3–23)

## 2018-01-06 LAB — GRAM STAIN

## 2018-01-06 LAB — ALBUMIN, PLEURAL OR PERITONEAL FLUID: ALBUMIN FL: 1.9 g/dL

## 2018-01-06 LAB — GLUCOSE, PLEURAL OR PERITONEAL FLUID: Glucose, Fluid: 108 mg/dL

## 2018-01-06 MED ORDER — LIDOCAINE HCL 1 % IJ SOLN
INTRAMUSCULAR | Status: AC
  Filled 2018-01-06: qty 10

## 2018-01-06 NOTE — Procedures (Signed)
Ultrasound-guided diagnostic and therapeutic left thoracentesis performed yielding 1.5 liters (maximum ordered) of yellow fluid. No immediate complications. Follow-up chest x-ray pending.The fluid was sent to the lab for preordered studies.

## 2018-01-06 NOTE — Telephone Encounter (Signed)
Phoned [facility] informed Staff that NP [name] would like a max of 1.5 liters pulled off left lung thoracentesis. Order placed and faxed. Will place fax and confirmation sheet in scan folder. No further needed at this time.

## 2018-01-06 NOTE — Telephone Encounter (Signed)
Spoke with the pt  He is questioning lasix dose  I advised he should be on lasix 40 mg bid  He verbalized understanding  Nothing further needed

## 2018-01-07 ENCOUNTER — Encounter: Payer: Self-pay | Admitting: Pulmonary Disease

## 2018-01-07 DIAGNOSIS — R846 Abnormal cytological findings in specimens from respiratory organs and thorax: Secondary | ICD-10-CM | POA: Diagnosis not present

## 2018-01-07 LAB — AMYLASE, PLEURAL OR PERITONEAL FLUID: AMYLASE FL: 22 U/L

## 2018-01-07 LAB — CHOLESTEROL, BODY FLUID: CHOL FL: 53 mg/dL

## 2018-01-07 LAB — FUNGUS CULTURE WITH STAIN

## 2018-01-07 LAB — FUNGAL ORGANISM REFLEX

## 2018-01-07 LAB — TRIGLYCERIDES, BODY FLUIDS: Triglycerides, Fluid: 9 mg/dL

## 2018-01-07 LAB — FUNGUS CULTURE RESULT

## 2018-01-08 ENCOUNTER — Telehealth: Payer: Self-pay | Admitting: Pulmonary Disease

## 2018-01-08 DIAGNOSIS — R5383 Other fatigue: Secondary | ICD-10-CM

## 2018-01-08 DIAGNOSIS — J90 Pleural effusion, not elsewhere classified: Secondary | ICD-10-CM

## 2018-01-08 LAB — CHOLESTEROL, BODY FLUID: Cholesterol, Fluid: 50 mg/dL

## 2018-01-08 LAB — CULTURE, BODY FLUID-BOTTLE

## 2018-01-08 LAB — CULTURE, BODY FLUID W GRAM STAIN -BOTTLE: Culture: NO GROWTH

## 2018-01-08 NOTE — Telephone Encounter (Addendum)
Lauraine Rinne, NP  Vivia Ewing, LPN        Can we call and check in on Mr. Gardiner. See how he is doing after his thoracentesis. I just attempted and left a message.   We can also let him know that the lab work results have come back still showing transudative fluid similar to the last time we drained this. Continue with Lasix at this time.   I will also do TSH lab test when you get blood work prior to your next appointment with [place]. Sometimes thyroid issues can cause these recurrent effusions so I think it would be good to check this.   Id also like to order a liver ultrasound study to check liver functioning. Your liver markers via blood work have been okay, but with the recurrent pleural effusions and chronic hyponatremia/low sodium I think this would be a good test to perform.   I have placed both of these orders to be completed. I have discussed this case with Dr. [name]. He agrees with both of these tests.   [place] and spoke to pt.  Pt is aware of above message and voiced his understanding.  Pt states breathing has improved since thoracentesis. Pt stated he is experiencing upper backache in between his shoulders and occ right thigh/ lower abdominal discomfort. Pt denied f/c/s or additional symptoms.  Pt also requested that echo results be mailed to address on file- verified with pt. Echo has been placed in outgoing mail.  Will route to [name] to as an [place]

## 2018-01-08 NOTE — Telephone Encounter (Signed)
01/08/2018- 1309  Attempted to call Stuart Watson, and left a voicemail.  His recurrent pleural effusions that he just recently had drained on Friday and as well as on Monday are appearing transudative.  Similar to the last time when they were drained.   I would like to proceed with a liver ultrasound to rule out cirrhosis.  We will also do a TSH upon arrival and next appointment to rule out any thyroid dysfunction.  [name] FNP

## 2018-01-08 NOTE — Telephone Encounter (Signed)
VS please advise. Thanks! 

## 2018-01-08 NOTE — Telephone Encounter (Signed)
Noted.  Thank you for letting me know.  Smt. Loder FNP  

## 2018-01-08 NOTE — Progress Notes (Signed)
Can we call and check in on Stuart Watson.  See how he is doing after his thoracentesis. I just attempted and left a message.  We can also let him know that the lab work results have come back still showing transudative fluid similar to the last time we drained this.  Continue with Lasix at this time.   I will also do TSH lab test when you get blood work prior to your next appointment with [place]. Sometimes thyroid issues can cause these recurrent effusions so I think it would be good to check this.   Id also like to order a liver ultrasound study to check liver functioning. Your liver markers via blood work have been okay, but with the recurrent pleural effusions and chronic hyponatremia/low sodium I think this would be a good test to perform.   I have placed both of these orders to be completed. I have discussed this case with Dr. [name]. He agrees with both of these tests.   [name] FNP

## 2018-01-09 ENCOUNTER — Other Ambulatory Visit: Payer: Self-pay | Admitting: Pulmonary Disease

## 2018-01-09 DIAGNOSIS — J90 Pleural effusion, not elsewhere classified: Secondary | ICD-10-CM

## 2018-01-11 LAB — CULTURE, BODY FLUID-BOTTLE: CULTURE: NO GROWTH

## 2018-01-11 LAB — CULTURE, BODY FLUID W GRAM STAIN -BOTTLE

## 2018-01-14 ENCOUNTER — Ambulatory Visit (HOSPITAL_COMMUNITY): Payer: Medicare Other

## 2018-01-15 ENCOUNTER — Ambulatory Visit (HOSPITAL_COMMUNITY)
Admission: RE | Admit: 2018-01-15 | Discharge: 2018-01-15 | Disposition: A | Discharge status: Home / Self Care | Payer: Medicare Other | Source: Ambulatory Visit | Attending: Pulmonary Disease | Admitting: Pulmonary Disease

## 2018-01-15 DIAGNOSIS — E8809 Other disorders of plasma-protein metabolism, not elsewhere classified: Secondary | ICD-10-CM | POA: Insufficient documentation

## 2018-01-15 DIAGNOSIS — R188 Other ascites: Secondary | ICD-10-CM | POA: Diagnosis not present

## 2018-01-15 DIAGNOSIS — E871 Hypo-osmolality and hyponatremia: Secondary | ICD-10-CM | POA: Diagnosis not present

## 2018-01-15 DIAGNOSIS — J90 Pleural effusion, not elsewhere classified: Secondary | ICD-10-CM | POA: Insufficient documentation

## 2018-01-20 ENCOUNTER — Other Ambulatory Visit (INDEPENDENT_AMBULATORY_CARE_PROVIDER_SITE_OTHER): Payer: Medicare Other

## 2018-01-20 ENCOUNTER — Encounter: Payer: Self-pay | Admitting: Pulmonary Disease

## 2018-01-20 ENCOUNTER — Ambulatory Visit (INDEPENDENT_AMBULATORY_CARE_PROVIDER_SITE_OTHER)
Admission: RE | Admit: 2018-01-20 | Discharge: 2018-01-20 | Disposition: A | Discharge status: Home / Self Care | Payer: Medicare Other | Source: Ambulatory Visit | Attending: Pulmonary Disease | Admitting: Pulmonary Disease

## 2018-01-20 ENCOUNTER — Ambulatory Visit (INDEPENDENT_AMBULATORY_CARE_PROVIDER_SITE_OTHER): Payer: Medicare Other | Admitting: Pulmonary Disease

## 2018-01-20 ENCOUNTER — Other Ambulatory Visit: Payer: Self-pay | Admitting: Pulmonary Disease

## 2018-01-20 ENCOUNTER — Telehealth: Payer: Self-pay | Admitting: Pulmonary Disease

## 2018-01-20 VITALS — BP 110/78 | HR 78 | Ht 66.0 in | Wt 152.0 lb

## 2018-01-20 DIAGNOSIS — E871 Hypo-osmolality and hyponatremia: Secondary | ICD-10-CM

## 2018-01-20 DIAGNOSIS — J90 Pleural effusion, not elsewhere classified: Secondary | ICD-10-CM

## 2018-01-20 DIAGNOSIS — R600 Localized edema: Secondary | ICD-10-CM | POA: Diagnosis not present

## 2018-01-20 DIAGNOSIS — R0602 Shortness of breath: Secondary | ICD-10-CM | POA: Diagnosis not present

## 2018-01-20 DIAGNOSIS — R5383 Other fatigue: Secondary | ICD-10-CM

## 2018-01-20 LAB — COMPREHENSIVE METABOLIC PANEL
ALBUMIN: 3.2 g/dL — AB (ref 3.5–5.2)
ALT: 8 U/L (ref 0–53)
AST: 16 U/L (ref 0–37)
Alkaline Phosphatase: 37 U/L — ABNORMAL LOW (ref 39–117)
BUN: 18 mg/dL (ref 6–23)
CO2: 29 mEq/L (ref 19–32)
Calcium: 8 mg/dL — ABNORMAL LOW (ref 8.4–10.5)
Chloride: 100 mEq/L (ref 96–112)
Creatinine, Ser: 0.91 mg/dL (ref 0.40–1.50)
GFR: 84.38 mL/min (ref >60.00)
Glucose, Bld: 80 mg/dL (ref 70–99)
Potassium: 3.7 mEq/L (ref 3.5–5.1)
SODIUM: 135 meq/L (ref 135–145)
Total Bilirubin: 0.4 mg/dL (ref 0.2–1.2)
Total Protein: 6 g/dL (ref 6.0–8.3)

## 2018-01-20 LAB — TSH: TSH: 5.91 u[IU]/mL — AB (ref 0.35–4.50)

## 2018-01-20 NOTE — Progress Notes (Signed)
Discussed results with patient in office visit today.  Will monitor this closely.  As patient continues to have bilateral pleural effusions.  We will have patient follow-up in their office later on this week.  Continue Lasix daily.  [name], FNP

## 2018-01-20 NOTE — Patient Instructions (Addendum)
Follow-up with me this Friday >>>Get chest x-ray before High-protein boost in addition to your meals Follow-up with our office if you are having any difficulties breathing or symptoms worsen Continue Lasix 60 mg daily Continue to monitor your weight daily if you are noticing your weight is increasing please take an additional 20 mg of Lasix for a total of 80 mg daily Follow up with Dr. [name] in 4 weeks   Please contact the office if your symptoms worsen or you have concerns that you are not improving.   Thank you for choosing [facility] for your healthcare, and for allowing [place] to partner with you on your healthcare journey. I am thankful to be able to provide care to you today.   [name] FNP-C

## 2018-01-20 NOTE — Telephone Encounter (Signed)
Called patient to discuss  Follow up with cardiologist. Patient will be calling back with info for cardiologist that he wants to follow up with.

## 2018-01-20 NOTE — Assessment & Plan Note (Signed)
Improved today Continue Lasix 60 mg daily, can increase to 80 mg total some additional 20 mg as needed for lower extremity swelling and weight increase

## 2018-01-20 NOTE — Progress Notes (Signed)
Discussed results with patient in office today.  Nothing further is needed at this time.  Keep follow-up this week.  [name], FNP

## 2018-01-20 NOTE — Progress Notes (Signed)
Discussed results with patient in office.  Encourage patient to start high protein boost in addition to his meals to improve albumin levels.  Will monitor TSH for now.  Subclinical hypothyroidism.  No further changes to plan of care at this time  [name], FNP

## 2018-01-20 NOTE — Progress Notes (Signed)
Reviewed and agree with assessment/plan.   Jakara Blatter, MD [facility] 07/18/2016, 12:24 PM Pager:  [phone]  

## 2018-01-20 NOTE — Telephone Encounter (Signed)
Pt states Dr. Minus Breeding is the cardiologist; pt contact # [phone]

## 2018-01-20 NOTE — Progress Notes (Signed)
_0  ID: Stuart Watson, male    DOB: Nov 29, 1931, 84 y.o.   MRN: [id number]  Chief Complaint  Patient presents with  . Follow-up    CXR today, states he feels like his fluid is building up again because he is more SOB and fatigued.Started Lasix 3m daily on the 19th temporarily. Has gained approx. 2lbs in last 4 days.     Referring provider: HWenda Low MD  HPI: 84year old male former smoker with chronic cough and lung nodules (stable 2014 10/10/2015).  And recurrent pneumonia. Dr. SHalford Chessmanpatient for previous aspiration pneumonitis.  Past medical history- temporal arteritis optic neuritis, GERD (on PPI and H2), AR  Recent [facility] Encounters:   12/30/2015- appointment with SHalford ChessmanHad been seen since 2015 with Dr. clients for cough.  Said that sometimes he gets a cough and feels like things get stuck in his throat. At this time was thought to have recurrent aspiration which was leaving leading to development of pneumonitis  02/22/2016-follow-up for aspiration pneumonia Patient return to office is feeling better reporting cough has improved.  Has been off steroids for 2 weeks.  Will get follow-up CT in 6 months.  06/29/2016-office visit >>>Seen by Dr. SHalford Chessmaninstructed to follow-up in a year stable and doing well.  CT chest reviewed GGO is resolved.  Nodule stable.  11/13/2017-acute visit >>>Patient states that reporting today because for the last 6 months they have not been feeling as well.  Feels activity tolerance is decreasing gets more winded easily.  Reporting daily dry cough.  As well as chronic ankle edema.  Taking Lasix daily.  Being followed by GI for GERD.  Chest x-ray at appointment reveals potential pneumonia.  Started on Z-Pak. Hyponatremic with labs.   11/22/17 office visit pneumonia hyponatremia >>>Patient presents from a one-week follow-up still has a progressive cough and shortness of breath over the last 6 months.  Patient is supposed to be on Lasix 20 mg daily  but reports he is only taking 1/2 tablet to decrease his urination issues.  He continues to have chronic leg swelling.  Chest x-ray today shows no improvement with left-sided atelectatic changes and effusion.  Patient was initially treated with a Z-Pak.  Patient started on Levaquin (11/22/17).  The patient's hyponatremia has improved.  BNP at this point in time is normal.   11/29/2017 OV >>>Patient returns to office today after complaints of continued shortness of breath, fatigue, and no improvement after 2 rounds of antibiotics (Z-Pak, Levaquin).  Patient states he noticed improvement in edema with 3-day trial of 40 mg of Lasix since last appointment.  Patient states edema returned as soon as he resume 20 mg daily.  Patient feels that he is actually feeling worse than how he felt prior to the Levaquin being started.  Wife mentions during ROS in initial patient interview the patient has been having continued episodes of feeling like he is choking while eating and choking when he coughs. Pt to get stat chest x-ray today in office.  12/13/17  HFU  Pt presents to office today for HFU and scheduled follow up. CXRY today shows new small left effusion. Pt reports clinically feeling much better, able to work out 2x this week.  Patient also reporting that he can breathe much better, as well as not having any orthopnea.  Patient reporting they completed a modified barium swallow eval. Patient will also follow-up with Dr. SHalford Chessmanfor the already planned appointment on 01/01/2018 for pulmonary function as well as office visit  follow-up.  01/01/2018-shortness of breath-office-sedated Patient was supposed to complete pulmonary function test prior to office visit today.  Patient was having difficulty breathing.  Patient has gained 7 pounds in 6 days.  Patient has increased productive cough green and yellow mucus, and wheezing. Acute CHF exacerbation with recurrent pleural effusions, increase Lasix to 40 mg twice daily.  Chest  x-ray today, labs today, monitor weight, might need repeat thoracentesis, if no improvement gets worse go to the hospital.  Follow-up on 6/14 with nurse practitioner.  01/03/18 OV  Pleasant patient today presenting today for follow-up.  Patient reporting that they have been adherent to their Lasix and have noticed that his weight is decreasing.  Weight is down 6 pounds since appointment with Dr. [name].  Chest x-ray today still showing persistent pleural effusions. Patient and spouse both concerned of worsening respiratory symptoms, shortness of breath, cough, difficulty laying flat.  Wife specifically wanting to present to emergency room to get both lungs drained today. Hyponatremia present on labs done on 01/01/2018.  This is chronic as we have seen on recent lab work. Plan: Coordinated same-day thoracentesis for outpatient procedure, will complete an additional thoracentesis on 6/17    Tests:  PFT 04/21/2014--FEV1 2.18 (98%), FEV1 73, TLC 6.45 (96%), DLCO 74%, no BD  Imaging:  CT chest 12/27/2015-test patchy consolidation LLL, GGO space 1.9 cm R UL, patchy GGO RML and RLL  CT chest 06/27/2016--GGO is resolved, no change in nodules  11/13/2017-chest x-ray-small pleural effusions and lower lobe opacity favoring atelectasis  11/22/2017- chest x-ray- little change from 4/24 x-ray, minimal improvement in the pleural effusions, pneumonia at left lung base is definite consideration 12/05/2017-CT chest with contrast- moderately large bilateral pleural effusions which appear free-flowing, resulting in partial atelectasis in both lower lobes >>>small noncalcified lung nodules with a calcified granuloma in the right middle lobe these changes are most likely postinflammatory or postinfectious in origin >>>small pericardial effusion 12/08/2017-chest x-ray- no significant residual pleural fluid, no evidence of pneumothorax or reexpansion edema, no acute findings post thoracentesis 12/13/2017-chest x-ray- bilateral  small pleural effusion 01/01/2018-chest x-ray-some increase in bilateral pleural effusions 01/06/2018- chest x-ray-significant reduction in left pleural effusion is noted following thoracentesis no pneumothorax is seen, small right effusion is again noted   Cardiac:  Echo 07/27/2013--EF 60 to 65% 12/04/2017- echo-grade 1 diastolic dysfunction, EF 60-45 percent   Labs:  11/13/17 - Sodium level  128 11/22/17 - Sodium level 132-hyponatremia resolving 11/13/17-BNP-31 12/06/2017-BNP-45.5 12/07/2017-protein electrophoresis-total protein low 5.6 01/01/2018- sodium 128 01/01/2018-bnp 60  Micro:  12/07/2017- fungal sputum culture-no fungus observed  12/07/17 - Sputum culture -rare squamous epithelial cells present, few gram-positive cocci in chains   Chart Review:  GI Tests: 04/18/16 - Upper Endoscopy and [facility]    12/06/2017-hospitalization-pleural effusion, discharge 12/08/2017 >>>Follow-up on pleural effusion fluid as well as sputum culture results >>>12/07/2017- right sided thoracentesis performed-1.7 L removed fluid consistent with transudate, 960 WBCs with 57% lymphs, Gram stain does not show any organisms, No malignant cells identified in right pleural fluid >>>12/08/2017- left-sided thoracentesis done-1.7 L again removed >>>Hyponatremia present on admission to hospital, given 1 L normal saline to help bring up he is asymptomatic  12/11/2017-swallow eval- mild aspiration risk no treatment changes at this time, regular solids and thin liquid   01/06/2018-ultrasound-guided diagnostic and therapeutic left thoracentesis-1.5 L of yellow fluid 01/03/2018- ultrasound-guided right thoracentesis total of approximately 2 L of clear yellow fluid was removed  01/15/2018-ultrasound liver Doppler- portable portal, hepatic, and splenic veins are patent, minimal ascites,  bilateral pleural effusions   01/20/18 OV  Pleasant patient reports to office with 2-week follow-up after completing bilateral thoracentesis (6/14  and 6/17) showing transudate of fluid.  This is the second time patient has needed to have bilateral thoracentesis performed.  Since last appointment patient has also completed abdominal ultrasound.   Patient spouse reports office visit today reporting that breathing and symptoms were improved after thoracentesis was completed on 6/14 as well as 6/17.  Patient also reporting that over the last 4 days he started to feel like he is getting a little bit more short of breath although it is not "bad yet".  Patient and wife are very concerned about Lasix dose and are requesting for it to be decreased as they do not feel the 80 mg has drastically improved his health outcomes.  The chest x-ray completed before patient appointment today as well as lab work.  No Known Allergies  Immunization History  Administered Date(s) Administered  . Influenza, High Dose Seasonal PF 04/22/2017  . Influenza,inj,Quad PF,6+ Mos 04/23/2015  . Pneumococcal Conjugate-13 07/23/2016  . Pneumococcal Polysaccharide-23 11/13/2012    Past Medical History:  Diagnosis Date  . Actinic keratoses   . Arthritis   . Colon polyps   . Dyspnea 07/13/2013   BNP normal  . Edema 07/13/2013  . GERD (gastroesophageal reflux disease)    omeprazole 1-2 times daily  . H/O TIA (transient ischemic attack) and stroke    on statins and plavix -saw Dr.Sethi in the past  . History of BPH   . History of TIA (transient ischemic attack) 07/13/2013  . Lumbar radiculopathy   . Need for shingles vaccine   . Optic neuritis 10/2015   left eye  . Perennial allergic rhinitis   . Polymyalgia rheumatica ([place]) 07/13/2013   Daily prednisone  . Restless leg syndrome   . Retinal disease    right eye since birth   . Shingles   . Temporal arteritis ([place]) 10/2015    Tobacco History: Social History   Tobacco Use  Smoking Status Former Smoker  . Packs/day: 1.00  . Years: 15.00  . Pack years: 15.00  . Types: Cigarettes  . Last attempt to quit:  07/23/1975  . Years since quitting: 42.5  Smokeless Tobacco Never Used   Counseling given: Yes Continue not smoking.  Outpatient Encounter Medications as of 01/20/2018  Medication Sig  . acetaminophen (TYLENOL) 325 MG tablet Take 650 mg by mouth every 6 (six) hours as needed for mild pain.   . cetirizine (ZYRTEC) 10 MG tablet Take 10 mg by mouth daily as needed. Reported on 12/30/2015  . clopidogrel (PLAVIX) 75 MG tablet Take 75 mg by mouth daily with breakfast.  . dextromethorphan-guaiFENesin (MUCINEX DM) 30-600 MG 12hr tablet Take 1 tablet by mouth 2 (two) times daily.  Marland Kitchen doxazosin (CARDURA) 8 MG tablet Take 8 mg by mouth daily.  . famotidine (PEPCID) 20 MG tablet Take 20 mg by mouth at bedtime.  . fluticasone (VERAMYST) 27.5 MCG/SPRAY nasal spray Place 2 sprays into the nose daily as needed.   . furosemide (LASIX) 20 MG tablet Take 1 tablet (20 mg total) by mouth daily.  . Multiple Vitamin (MULTI-VITAMINS) TABS Take 1 tablet by mouth daily.  Marland Kitchen omeprazole (PRILOSEC) 20 MG capsule Take 1 capsule (20 mg total) by mouth daily.  . simvastatin (ZOCOR) 20 MG tablet Take 20 mg by mouth daily.   No facility-administered encounter medications on file as of 01/20/2018.      Review  of Systems  Constitutional: +fatigue  No  weight loss, night sweats,  fevers, chills HEENT:   No headaches,  Difficulty swallowing,  Tooth/dental problems, or  Sore throat, No sneezing, itching, ear ache, nasal congestion, post nasal drip  CV:  No chest pain,  orthopnea, PND, swelling in lower extremities, anasarca, dizziness, palpitations, syncope  GI: No heartburn, indigestion, abdominal pain, nausea, vomiting, diarrhea, change in bowel habits, loss of appetite, bloody stools Resp:+sob with exertion  No shortness of breath at rest.  No excess mucus, no productive cough,  No non-productive cough,  No coughing up of blood.  No change in color of mucus.  No wheezing.  No chest wall deformity Skin: no rash, lesions, no skin  changes. GU: no dysuria, change in color of urine, no urgency or frequency.  No flank pain, no hematuria  MS:  No joint pain or swelling.  No decreased range of motion.  No back pain. Psych:  No change in mood or affect. No depression or anxiety.  No memory loss.   Physical Exam  BP 110/78   Pulse 78   Ht _0  (1.676 m)   Wt 152 lb (68.9 kg)   SpO2 96%   BMI 24.53 kg/m   Wt Readings from Last 3 Encounters:  01/20/18 152 lb (68.9 kg)  01/03/18 157 lb 12.8 oz (71.6 kg)  01/01/18 163 lb (73.9 kg)    GEN: A/Ox3; pleasant , NAD, well nourished    HEENT:  [place]/AT,  EACs-clear, TMs-wnl, NOSE-clear, THROAT-clear, no lesions, no postnasal drip or exudate noted.   NECK:  Supple w/ fair ROM; no JVD; normal carotid impulses w/o bruits; no thyromegaly or nodules palpated; no lymphadenopathy.    RESP:  Clear  P & A; w/o, wheezes/ rales/ or rhonchi, diminished breath sounds in R>L base. no accessory muscle use, no dullness to percussion  CARD:  RRR, no m/r/g, no peripheral edema, pulses intact, no cyanosis or clubbing.  GI:   Soft & nt; nml bowel sounds; no organomegaly or masses detected.   Musco: Warm bil, no deformities or joint swelling noted.   Neuro: alert, no focal deficits noted.    Skin: Warm, no lesions or rashes    Lab Results:  CBC    Component Value Date/Time   WBC 4.5 01/01/2018 1103   RBC 4.19 (L) 01/01/2018 1103   HGB 13.6 01/01/2018 1103   HCT 40.3 01/01/2018 1103   PLT 307.0 01/01/2018 1103   MCV 96.3 01/01/2018 1103   MCH 31.7 12/07/2017 0400   MCHC 33.8 01/01/2018 1103   RDW 13.8 01/01/2018 1103   LYMPHSABS 1.0 01/01/2018 1103   MONOABS 0.5 01/01/2018 1103   EOSABS 0.0 01/01/2018 1103   BASOSABS 0.0 01/01/2018 1103    BMET    Component Value Date/Time   NA 135 01/20/2018 1033   K 3.7 01/20/2018 1033   CL 100 01/20/2018 1033   CO2 29 01/20/2018 1033   GLUCOSE 80 01/20/2018 1033   BUN 18 01/20/2018 1033   CREATININE 0.91 01/20/2018 1033    CALCIUM 8.0 (L) 01/20/2018 1033   GFRNONAA >60 12/08/2017 1259   GFRAA >60 12/08/2017 1259    BNP    Component Value Date/Time   BNP 45.5 12/06/2017 1412    ProBNP    Component Value Date/Time   PROBNP 60.0 01/01/2018 1103    Imaging: Dg Chest 1 View  Result Date: 01/06/2018 CLINICAL DATA:  Status post thoracentesis on the left EXAM: CHEST  1 VIEW  COMPARISON:  01/03/2017 FINDINGS: Cardiac shadow is stable. Significant reduction in left pleural effusion is noted following thoracentesis. No pneumothorax is seen. Small right effusion is again noted. Some atelectasis/infiltrate in the left base is noted. IMPRESSION: No pneumothorax following left thoracentesis. Electronically Signed   By: Inez Catalina M.D.   On: 01/06/2018 15:36   Dg Chest 1 View  Result Date: 01/03/2018 CLINICAL DATA:  Initial evaluation status post thoracentesis. EXAM: CHEST  1 VIEW COMPARISON:  Prior radiograph from earlier the same day. FINDINGS: Cardiac and mediastinal silhouettes are stable in size and contour, and remain within normal limits. Lungs normally inflated. Interval decrease in a right pleural effusion status post thoracentesis, with only a small layering effusion now seen. No pneumothorax or other complication. Improved right basilar atelectasis. A moderate layering left pleural effusion with associated atelectasis is unchanged. No new focal airspace disease. Osseous structures unchanged. IMPRESSION: 1. Interval improvement in small layering right pleural effusion status post thoracentesis. No pneumothorax or other complication. 2. Unchanged moderate left pleural effusion with associated atelectasis. Electronically Signed   By: Jeannine Boga M.D.   On: 01/03/2018 13:13   Dg Chest 2 View  Result Date: 01/20/2018 CLINICAL DATA:  Shortness of breath, pleural effusions. EXAM: CHEST - 2 VIEW COMPARISON:  Radiograph of January 06, 2018. FINDINGS: Stable cardiomediastinal silhouette. No pneumothorax is noted.  Moderate bilateral pleural effusions are noted which are increased compared to prior exam, with underlying atelectasis or infiltrate. Bony thorax is unremarkable. IMPRESSION: Increased bilateral pleural effusions are noted with associated atelectasis or infiltrate. Electronically Signed   By: Marijo Conception, M.D.   On: 01/20/2018 10:41   Dg Chest 2 View  Result Date: 01/03/2018 CLINICAL DATA:  Pleural effusion EXAM: CHEST - 2 VIEW COMPARISON:  01/01/2018 FINDINGS: Small bilateral pleural effusions with bibasilar atelectasis, stable since prior study. Heart is likely normal in size. No acute bony abnormality. IMPRESSION: Stable bilateral effusions and bibasilar atelectasis. Electronically Signed   By: Rolm Baptise M.D.   On: 01/03/2018 10:32   Dg Chest 2 View  Result Date: 01/01/2018 CLINICAL DATA:  History of bilateral pleural effusions. Increasing shortness of breath. EXAM: CHEST - 2 VIEW COMPARISON:  PA and lateral chest 12/13/2017. FINDINGS: Small bilateral pleural effusions and basilar airspace disease have worsened since the prior examination. There is no evidence of pulmonary edema. Cardiac silhouette is obscured. No pneumothorax. No focal bony abnormality. IMPRESSION: Some increase in small bilateral pleural effusions and basilar airspace disease, likely atelectasis. Electronically Signed   By: Inge Rise M.D.   On: 01/01/2018 13:02   US Liver Doppler  Result Date: 01/17/2018 EXAM: DUPLEX ULTRASOUND OF LIVER TECHNIQUE: Color and duplex Doppler ultrasound was performed to evaluate the hepatic in-flow and out-flow vessels. COMPARISON:  None. FINDINGS: Portal Vein Velocities Main:  36 cm/sec Right:  20 cm/sec Left:  17 cm/sec Hepatic Vein Velocities Right:  38 cm/sec Middle:  26 cm/sec Left:  31 cm/sec Hepatic Artery Velocity:  84 cm/sec Splenic Vein Velocity:  15 cm/sec Varices: Absent Ascites: Trace ascites about the liver is noted. Bilateral pleural effusions are present. Hepatic and portal  veins are patent with normal directionality of flow. Hepatic veins are hepatofugal and portal veins are hepatopetal inflow. Splenic vein is also patent with normal directionality of flow. IMPRESSION: Portal, hepatic, and splenic veins are patent. Minimal ascites. Bilateral pleural effusions. Electronically Signed   By: Marybelle Killings M.D.   On: 01/17/2018 10:03   US Thoracentesis Asp Pleural Space W/img  Guide  Result Date: 01/06/2018 INDICATION: Patient with history of CHF, recurrent bilateral pleural effusions; request made for diagnostic and therapeutic left thoracentesis up to 1.5 liters. EXAM: ULTRASOUND GUIDED DIAGNOSTIC AND THERAPEUTIC LEFT THORACENTESIS MEDICATIONS: None COMPLICATIONS: None immediate. PROCEDURE: An ultrasound guided thoracentesis was thoroughly discussed with the patient and questions answered. The benefits, risks, alternatives and complications were also discussed. The patient understands and wishes to proceed with the procedure. Written consent was obtained. Ultrasound was performed to localize and mark an adequate pocket of fluid in the left chest. The area was then prepped and draped in the normal sterile fashion. 1% Lidocaine was used for local anesthesia. Under ultrasound guidance a 6 Fr Safe-T-Centesis catheter was introduced. Thoracentesis was performed. The catheter was removed and a dressing applied. FINDINGS: A total of approximately 1.5 liters of yellow fluid was removed. Samples were sent to the laboratory as requested by the clinical team. IMPRESSION: Successful ultrasound guided diagnostic and therapeutic left thoracentesis yielding 1.5 liters of pleural fluid. Read by: Rowe Robert, PA-C Electronically Signed   By: Sandi Mariscal M.D.   On: 01/06/2018 15:27   US Thoracentesis Asp Pleural Space W/img Guide  Result Date: 01/03/2018 INDICATION: Dyspnea on exertion. Recurrent bilateral pleural effusions. Request for diagnostic and therapeutic thoracentesis. EXAM: ULTRASOUND  GUIDED RIGHT THORACENTESIS MEDICATIONS: 1% lidocaine local COMPLICATIONS: None immediate. PROCEDURE: An ultrasound guided thoracentesis was thoroughly discussed with the patient and questions answered. The benefits, risks, alternatives and complications were also discussed. The patient understands and wishes to proceed with the procedure. Written consent was obtained. Ultrasound was performed to localize and mark an adequate pocket of fluid in the right chest. The area was then prepped and draped in the normal sterile fashion. 1% Lidocaine was used for local anesthesia. Under ultrasound guidance a 6 Fr Safe-T-Centesis catheter was introduced. Thoracentesis was performed. The catheter was removed and a dressing applied. FINDINGS: A total of approximately 2 L of clear yellow fluid was removed. Samples were sent to the laboratory as requested by the clinical team. IMPRESSION: Successful ultrasound guided right thoracentesis yielding 2 L of pleural fluid. Read by: Ascencion Dike PA-C Electronically Signed   By: [name].  Shick M.D.   On: 01/03/2018 13:29     Assessment & Plan:   Pleasant 84 year old patient seen office today.  We will have him follow back up later on this week with a chest x-ray just to monitor his symptoms closely.  I fear he may potentially be retaining more fluid and may need another therapeutic and diagnostic thoracentesis in the near future.  TSH today is mildly elevated for subclinical hypothyroidism.  Discussed with patient and spouse.  Also discussed that albumin is low that we should start high-protein boost in between meals.  We will have patient follow-up with Dr. [name] in 4 weeks.  Will refer to cardiology to rule out any cardiac component for bilateral pleural effusions that are reoccurring with a transudative make-up and negative path.  Bilateral pleural effusion Follow-up with me this Friday >>>Get chest x-ray before High-protein boost in addition to your meals Follow-up with  our office if you are having any difficulties breathing or symptoms worsen Continue Lasix 60 mg daily Continue to monitor your weight daily if you are noticing your weight is increasing please take an additional 20 mg of Lasix for a total of 80 mg daily Follow up with Dr. [name] in 4 weeks    Hyponatremia Improved on C met today  Pedal edema- chronic Improved today Continue  Lasix 60 mg daily, can increase to 80 mg total some additional 20 mg as needed for lower extremity swelling and weight increase     Lauraine Rinne, NP 01/20/2018

## 2018-01-20 NOTE — Assessment & Plan Note (Signed)
Improved on C met today

## 2018-01-20 NOTE — Assessment & Plan Note (Addendum)
Follow-up with me this Friday >>>Get chest x-ray before High-protein boost in addition to your meals Follow-up with our office if you are having any difficulties breathing or symptoms worsen Continue Lasix 60 mg daily Continue to monitor your weight daily if you are noticing your weight is increasing please take an additional 20 mg of Lasix for a total of 80 mg daily Follow up with Dr. [name] in 4 weeks   We will also refer to cardiology to rule out any cardiac work-up.

## 2018-01-21 ENCOUNTER — Other Ambulatory Visit: Payer: Self-pay | Admitting: Pulmonary Disease

## 2018-01-21 ENCOUNTER — Encounter: Payer: Self-pay | Admitting: Pulmonary Disease

## 2018-01-21 DIAGNOSIS — R911 Solitary pulmonary nodule: Secondary | ICD-10-CM | POA: Insufficient documentation

## 2018-01-21 DIAGNOSIS — M519 Unspecified thoracic, thoracolumbar and lumbosacral intervertebral disc disorder: Secondary | ICD-10-CM | POA: Insufficient documentation

## 2018-01-21 DIAGNOSIS — M1990 Unspecified osteoarthritis, unspecified site: Secondary | ICD-10-CM | POA: Insufficient documentation

## 2018-01-21 DIAGNOSIS — G939 Disorder of brain, unspecified: Secondary | ICD-10-CM | POA: Insufficient documentation

## 2018-01-21 DIAGNOSIS — I6782 Cerebral ischemia: Secondary | ICD-10-CM | POA: Insufficient documentation

## 2018-01-21 DIAGNOSIS — M316 Other giant cell arteritis: Secondary | ICD-10-CM | POA: Insufficient documentation

## 2018-01-21 DIAGNOSIS — R739 Hyperglycemia, unspecified: Secondary | ICD-10-CM | POA: Insufficient documentation

## 2018-01-21 DIAGNOSIS — G2581 Restless legs syndrome: Secondary | ICD-10-CM | POA: Insufficient documentation

## 2018-01-21 DIAGNOSIS — J309 Allergic rhinitis, unspecified: Secondary | ICD-10-CM | POA: Insufficient documentation

## 2018-01-21 DIAGNOSIS — R0602 Shortness of breath: Secondary | ICD-10-CM

## 2018-01-21 DIAGNOSIS — J90 Pleural effusion, not elsewhere classified: Secondary | ICD-10-CM

## 2018-01-21 DIAGNOSIS — N400 Enlarged prostate without lower urinary tract symptoms: Secondary | ICD-10-CM | POA: Insufficient documentation

## 2018-01-21 DIAGNOSIS — E7800 Pure hypercholesterolemia, unspecified: Secondary | ICD-10-CM | POA: Insufficient documentation

## 2018-01-21 NOTE — Telephone Encounter (Signed)
Order for referral to cardiologist placed and patient awaiting follow up. Nothing further needed at this time.

## 2018-01-22 ENCOUNTER — Encounter: Payer: Self-pay | Admitting: Cardiology

## 2018-01-22 ENCOUNTER — Ambulatory Visit (INDEPENDENT_AMBULATORY_CARE_PROVIDER_SITE_OTHER): Payer: Medicare Other | Admitting: Cardiology

## 2018-01-22 ENCOUNTER — Other Ambulatory Visit: Payer: Self-pay | Admitting: Cardiology

## 2018-01-22 VITALS — BP 112/56 | HR 68 | Resp 20 | Ht 66.0 in | Wt 154.1 lb

## 2018-01-22 DIAGNOSIS — I2510 Atherosclerotic heart disease of native coronary artery without angina pectoris: Secondary | ICD-10-CM

## 2018-01-22 DIAGNOSIS — R0600 Dyspnea, unspecified: Secondary | ICD-10-CM

## 2018-01-22 DIAGNOSIS — I2584 Coronary atherosclerosis due to calcified coronary lesion: Secondary | ICD-10-CM | POA: Diagnosis not present

## 2018-01-22 DIAGNOSIS — J90 Pleural effusion, not elsewhere classified: Secondary | ICD-10-CM

## 2018-01-22 NOTE — Progress Notes (Signed)
Cardiology Office Note:    Date:  01/22/2018   [name], DOB Jun 30, 1932, MRN [id number]  PCP:  Wenda Low, MD  Cardiologist:  Jenean Lindau, MD   Referring MD: Lauraine Rinne, NP    ASSESSMENT:    1. Bilateral pleural effusion   2. Coronary artery calcification    PLAN:    In order of problems listed above:  1. Primary prevention stressed with the patient.  Importance of compliance with diet and medications stressed and the patient vocalized understanding. 2. In view of coronary calcifications he will need to be on aspirin and statin therapy and I believe it to the care of his primary care physician will follow this long-term.  It is unclear to me at this time as to what is the etiology of his pleural effusions.  In view of coronary calcifications I would like to rule out obstructive coronary artery disease.  I mentioned to him that he will need a exercise stress Cardiolite and he prefers to get it done after his pleural tap.  Once his pleural effusion has been tapped then he will call us to schedule an exercise stress Cardiolite.  On the last evaluation done on the first of this month he has bilateral moderate pleural effusions.  He will be seen in follow-up appointment on a as needed basis   Medication Adjustments/Labs and Tests Ordered: Current medicines are reviewed at length with the patient today.  Concerns regarding medicines are outlined above.  Orders Placed This Encounter  Procedures  . Myocardial Perfusion Imaging   No orders of the defined types were placed in this encounter.    History of Present Illness:    Stuart Watson is a 84 y.o. male who is being seen today for the evaluation of coronary calcification and bilateral pleural effusions which are recurrent patient is a pleasant 84 year old male.  At the request of Lauraine Rinne, NP.  He has past medical history which is in general not very significant.  Recently has been noted to have bilateral  pleural effusions which are recurring.  They have been tapped on 2 occasions but have recurred again therefore he was sent here for evaluation.  He takes care of activities of daily living.  He mentions to me that when he exerts more than usual he has shortness of breath on exertion especially when he has pleural effusions.  His wife accompanies him for this visit.  She is very supportive.  At the time of my evaluation, the patient is alert awake oriented and in no distress.  Past Medical History:  Diagnosis Date  . Actinic keratoses   . Arthritis   . Colon polyps   . Dyspnea 07/13/2013   BNP normal  . Edema 07/13/2013  . GERD (gastroesophageal reflux disease)    omeprazole 1-2 times daily  . H/O TIA (transient ischemic attack) and stroke    on statins and plavix -saw Dr.Sethi in the past  . History of BPH   . History of TIA (transient ischemic attack) 07/13/2013  . Lumbar radiculopathy   . Need for shingles vaccine   . Optic neuritis 10/2015   left eye  . Perennial allergic rhinitis   . Polymyalgia rheumatica ([place]) 07/13/2013   Daily prednisone  . Restless leg syndrome   . Retinal disease    right eye since birth   . Shingles   . Temporal arteritis ([place]) 10/2015    Past Surgical History:  Procedure Laterality  Date  . [facility] STUDY N/A 04/18/2016   Procedure: BRAVO [facility];  Surgeon: Wilford Corner, MD;  Location: [facility] ENDOSCOPY;  Service: Endoscopy;  Laterality: N/A;  . COLONOSCOPY    . drropy eyelid surgery    . ESOPHAGOGASTRODUODENOSCOPY (EGD) WITH PROPOFOL N/A 04/18/2016   Procedure: ESOPHAGOGASTRODUODENOSCOPY (EGD) WITH PROPOFOL;  Surgeon: Wilford Corner, MD;  Location: [facility] ENDOSCOPY;  Service: Endoscopy;  Laterality: N/A;  . VASECTOMY      Current Medications: Current Meds  Medication Sig  . acetaminophen (TYLENOL) 325 MG tablet Take 650 mg by mouth every 6 (six) hours as needed for mild pain.   . cetirizine (ZYRTEC) 10 MG tablet Take 10 mg by mouth daily as needed.  Reported on 12/30/2015  . clopidogrel (PLAVIX) 75 MG tablet Take 75 mg by mouth daily with breakfast.  . dextromethorphan-guaiFENesin (MUCINEX DM) 30-600 MG 12hr tablet Take 1 tablet by mouth 2 (two) times daily.  Marland Kitchen doxazosin (CARDURA) 8 MG tablet Take 8 mg by mouth daily.  . famotidine (PEPCID) 20 MG tablet Take 20 mg by mouth at bedtime.  . fluticasone (VERAMYST) 27.5 MCG/SPRAY nasal spray Place 2 sprays into the nose daily as needed.   . furosemide (LASIX) 20 MG tablet Take 1 tablet (20 mg total) by mouth daily. (Patient taking differently: Take 60 mg by mouth daily. )  . omeprazole (PRILOSEC) 20 MG capsule Take 1 capsule (20 mg total) by mouth daily.  . simvastatin (ZOCOR) 20 MG tablet Take 20 mg by mouth daily.     Allergies:   Patient has no known allergies.   Social History   Socioeconomic History  . Marital status: Married    Spouse name: Not on file  . Number of children: 2  . Years of education: Not on file  . Highest education level: Not on file  Occupational History  . Occupation: retired  Scientific laboratory technician  . Financial resource strain: Not on file  . Food insecurity:    Worry: Not on file    Inability: Not on file  . Transportation needs:    Medical: Not on file    Non-medical: Not on file  Tobacco Use  . Smoking status: Former Smoker    Packs/day: 1.00    Years: 15.00    Pack years: 15.00    Types: Cigarettes    Last attempt to quit: 07/23/1975    Years since quitting: 42.5  . Smokeless tobacco: Never Used  Substance and Sexual Activity  . Alcohol use: Yes    Alcohol/week: 4.2 oz    Types: 7 Glasses of wine per week    Comment: 1 glass red wine daily  . Drug use: No  . Sexual activity: Not on file  Lifestyle  . Physical activity:    Days per week: Not on file    Minutes per session: Not on file  . Stress: Not on file  Relationships  . Social connections:    Talks on phone: Not on file    Gets together: Not on file    Attends religious service: Not on  file    Active member of club or organization: Not on file    Attends meetings of clubs or organizations: Not on file    Relationship status: Not on file  Other Topics Concern  . Not on file  Social History Narrative  . Not on file     Family History: The patient's family history includes Breast cancer in his mother; Coronary artery disease in  his father and mother; Diabetes in his father and mother; Berenice Primas' disease in his sister; Heart attack in his father; Stroke in his mother.  ROS:   Please see the history of present illness.    All other systems reviewed and are negative.  EKGs/Labs/Other Studies Reviewed:    The following studies were reviewed today: I discussed my findings with the patient at length and reviewed the EKG.  I also read reviewed chest CT scan findings which revealed extensive coronary calcification.   Recent Labs: 12/06/2017: B Natriuretic Peptide 45.5 01/01/2018: Hemoglobin 13.6; Platelets 307.0; Pro B Natriuretic peptide (BNP) 60.0 01/20/2018: ALT 8; BUN 18; Creatinine, Ser 0.91; Potassium 3.7; Sodium 135; TSH 5.91  Recent Lipid Panel No results found for: CHOL, TRIG, HDL, CHOLHDL, VLDL, LDLCALC, LDLDIRECT  Physical Exam:    VS:  BP (!) 112/56 (BP Location: Right Arm)   Pulse 68   Resp 20   Ht 5\' 6"  (1.676 m)   Wt 154 lb 1.9 oz (69.9 kg)   SpO2 93%   BMI 24.88 kg/m     Wt Readings from Last 3 Encounters:  01/22/18 154 lb 1.9 oz (69.9 kg)  01/20/18 152 lb (68.9 kg)  01/03/18 157 lb 12.8 oz (71.6 kg)     GEN: Patient is in no acute distress HEENT: Normal NECK: No JVD; No carotid bruits LYMPHATICS: No lymphadenopathy CARDIAC: S1 S2 regular, 2/6 systolic murmur at the apex. RESPIRATORY:  Clear to auscultation without rales, wheezing or rhonchi  ABDOMEN: Soft, non-tender, non-distended MUSCULOSKELETAL:  No edema; No deformity  SKIN: Warm and dry NEUROLOGIC:  Alert and oriented x 3 PSYCHIATRIC:  Normal affect    Signed, Jenean Lindau, MD    01/22/2018 9:28 AM    [facility]

## 2018-01-22 NOTE — Patient Instructions (Signed)
Medication Instructions:  Your physician recommends that you continue on your current medications as directed. Please refer to the Current Medication list given to you today.  Labwork: None ordered  Testing/Procedures: Your physician has requested that you have en exercise stress myoview. For further information please visit [email or web address]. Please follow instruction sheet, as given.  Follow-Up: Your physician recommends that you schedule a follow-up appointment in: pending your exercise stress test   Any Other Special Instructions Will Be Listed Below (If Applicable).     If you need a refill on your cardiac medications before your next appointment, please call your pharmacy.

## 2018-01-24 ENCOUNTER — Ambulatory Visit (INDEPENDENT_AMBULATORY_CARE_PROVIDER_SITE_OTHER)
Admission: RE | Admit: 2018-01-24 | Discharge: 2018-01-24 | Disposition: A | Discharge status: Home / Self Care | Payer: Medicare Other | Source: Ambulatory Visit | Attending: Pulmonary Disease | Admitting: Pulmonary Disease

## 2018-01-24 ENCOUNTER — Ambulatory Visit (HOSPITAL_COMMUNITY): Payer: Medicare Other

## 2018-01-24 ENCOUNTER — Other Ambulatory Visit (INDEPENDENT_AMBULATORY_CARE_PROVIDER_SITE_OTHER): Payer: Medicare Other

## 2018-01-24 ENCOUNTER — Ambulatory Visit (INDEPENDENT_AMBULATORY_CARE_PROVIDER_SITE_OTHER): Payer: Medicare Other | Admitting: Pulmonary Disease

## 2018-01-24 ENCOUNTER — Encounter: Payer: Self-pay | Admitting: Pulmonary Disease

## 2018-01-24 ENCOUNTER — Ambulatory Visit (HOSPITAL_COMMUNITY)
Admission: RE | Admit: 2018-01-24 | Discharge: 2018-01-24 | Disposition: A | Discharge status: Home / Self Care | Payer: Medicare Other | Source: Ambulatory Visit | Attending: Pulmonary Disease | Admitting: Pulmonary Disease

## 2018-01-24 ENCOUNTER — Ambulatory Visit (HOSPITAL_COMMUNITY)
Admission: RE | Admit: 2018-01-24 | Discharge: 2018-01-24 | Disposition: A | Discharge status: Home / Self Care | Payer: Medicare Other | Source: Ambulatory Visit | Attending: Radiology | Admitting: Radiology

## 2018-01-24 VITALS — BP 110/60 | HR 84 | Ht 66.0 in | Wt 154.4 lb

## 2018-01-24 DIAGNOSIS — J90 Pleural effusion, not elsewhere classified: Secondary | ICD-10-CM | POA: Diagnosis not present

## 2018-01-24 DIAGNOSIS — Z9889 Other specified postprocedural states: Secondary | ICD-10-CM

## 2018-01-24 DIAGNOSIS — R846 Abnormal cytological findings in specimens from respiratory organs and thorax: Secondary | ICD-10-CM | POA: Diagnosis not present

## 2018-01-24 DIAGNOSIS — R0602 Shortness of breath: Secondary | ICD-10-CM | POA: Diagnosis not present

## 2018-01-24 DIAGNOSIS — R05 Cough: Secondary | ICD-10-CM | POA: Diagnosis not present

## 2018-01-24 LAB — BODY FLUID CELL COUNT WITH DIFFERENTIAL
Lymphs, Fluid: 90 %
MONOCYTE-MACROPHAGE-SEROUS FLUID: 9 % — AB (ref 50–90)
NEUTROPHIL FLUID: 1 % (ref 0–25)
WBC FLUID: 976 uL (ref 0–1000)

## 2018-01-24 LAB — GRAM STAIN

## 2018-01-24 LAB — ALBUMIN, PLEURAL OR PERITONEAL FLUID: Albumin, Fluid: 1.9 g/dL

## 2018-01-24 LAB — SEDIMENTATION RATE: Sed Rate: 4 mm/hr (ref 0–20)

## 2018-01-24 LAB — C-REACTIVE PROTEIN: CRP: 0.3 mg/dL — ABNORMAL LOW (ref 0.5–20.0)

## 2018-01-24 LAB — LACTATE DEHYDROGENASE, PLEURAL OR PERITONEAL FLUID: LD, Fluid: 61 U/L — ABNORMAL HIGH (ref 3–23)

## 2018-01-24 LAB — PROTEIN, PLEURAL OR PERITONEAL FLUID: Total protein, fluid: 3.3 g/dL

## 2018-01-24 LAB — GLUCOSE, PLEURAL OR PERITONEAL FLUID: Glucose, Fluid: 116 mg/dL

## 2018-01-24 MED ORDER — LIDOCAINE HCL 1 % IJ SOLN
INTRAMUSCULAR | Status: AC
  Filled 2018-01-24: qty 20

## 2018-01-24 NOTE — Progress Notes (Signed)
Reviewed and agree with assessment/plan.   Prophet Renwick, MD [facility] 07/18/2016, 12:24 PM Pager:  [phone]  

## 2018-01-24 NOTE — Telephone Encounter (Signed)
Checking patient's chart, the thoracentesis has been rescheduled to 01/27/18

## 2018-01-24 NOTE — Assessment & Plan Note (Signed)
Lab work today >>>Will get lab work today ANA, C-reactive protein, CCP antibody, serum LDH, rheumatoid factor, sed rate.  We will coordinate outpatient procedure for diagnostic and therapeutic thoracentesis >>> Left today >>> Right next week   Follow-up with our office in 2 weeks with a chest x-ray  Referral to cardiothoracic surgery for chronic management of pleural effusions   Contact cardiology and schedule stress test as discussed at your 01/22/2018 appointment  Continue high protein boost

## 2018-01-24 NOTE — Progress Notes (Signed)
@Patient  ID: Stuart Watson, male    DOB: 07/24/1931, 84 y.o.   MRN: [id number]  Chief Complaint  Patient presents with  . Follow-up    CXR today, more SOB than last visit.     Referring provider: Wenda Low, MD  HPI: 84 year old male former smoker with chronic cough and lung nodules (stable 2014 10/10/2015).  And recurrent pneumonia. Dr. [name] patient for previous aspiration pneumonitis.  Past medical history- temporal arteritis optic neuritis, GERD (on PPI and H2), AR  Recent [facility] Encounters:   12/30/2015- appointment with [name] Had been seen since 2015 with Dr. clients for cough.  Said that sometimes he gets a cough and feels like things get stuck in his throat. At this time was thought to have recurrent aspiration which was leaving leading to development of pneumonitis  02/22/2016-follow-up for aspiration pneumonia Patient return to office is feeling better reporting cough has improved.  Has been off steroids for 2 weeks.  Will get follow-up CT in 6 months.  06/29/2016-office visit >>>Seen by Dr. [name] instructed to follow-up in a year stable and doing well.  CT chest reviewed GGO is resolved.  Nodule stable.  11/13/2017-acute visit >>>Patient states that reporting today because for the last 6 months they have not been feeling as well.  Feels activity tolerance is decreasing gets more winded easily.  Reporting daily dry cough.  As well as chronic ankle edema.  Taking Lasix daily.  Being followed by GI for GERD.  Chest x-ray at appointment reveals potential pneumonia.  Started on Z-Pak. Hyponatremic with labs.   11/22/17 office visit pneumonia hyponatremia >>>Patient presents from a one-week follow-up still has a progressive cough and shortness of breath over the last 6 months.  Patient is supposed to be on Lasix 20 mg daily but reports he is only taking 1/2 tablet to decrease his urination issues.  He continues to have chronic leg swelling.  Chest x-ray today shows no  improvement with left-sided atelectatic changes and effusion.  Patient was initially treated with a Z-Pak.  Patient started on Levaquin (11/22/17).  The patient's hyponatremia has improved.  BNP at this point in time is normal.   11/29/2017 OV >>>Patient returns to office today after complaints of continued shortness of breath, fatigue, and no improvement after 2 rounds of antibiotics (Z-Pak, Levaquin).  Patient states he noticed improvement in edema with 3-day trial of 40 mg of Lasix since last appointment.  Patient states edema returned as soon as he resume 20 mg daily.  Patient feels that he is actually feeling worse than how he felt prior to the Levaquin being started.  Wife mentions during ROS in initial patient interview the patient has been having continued episodes of feeling like he is choking while eating and choking when he coughs. Pt to get stat chest x-ray today in office.  12/13/17  HFU  Pt presents to office today for HFU and scheduled follow up. CXRY today shows new small left effusion. Pt reports clinically feeling much better, able to work out 2x this week.  Patient also reporting that he can breathe much better, as well as not having any orthopnea.  Patient reporting they completed a modified barium swallow eval. Patient will also follow-up with Dr. [name] for the already planned appointment on 01/01/2018 for pulmonary function as well as office visit follow-up.  01/01/2018-shortness of breath-office-sedated Patient was supposed to complete pulmonary function test prior to office visit today.  Patient was having difficulty breathing.  Patient has  gained 7 pounds in 6 days.  Patient has increased productive cough green and yellow mucus, and wheezing. Acute CHF exacerbation with recurrent pleural effusions, increase Lasix to 40 mg twice daily.  Chest x-ray today, labs today, monitor weight, might need repeat thoracentesis, if no improvement gets worse go to the hospital.  Follow-up on 6/14 with  nurse practitioner.  01/03/18 OV  Pleasant patient today presenting today for follow-up.  Patient reporting that they have been adherent to their Lasix and have noticed that his weight is decreasing.  Weight is down 6 pounds since appointment with Dr. [name].  Chest x-ray today still showing persistent pleural effusions. Patient and spouse both concerned of worsening respiratory symptoms, shortness of breath, cough, difficulty laying flat.  Wife specifically wanting to present to emergency room to get both lungs drained today. Hyponatremia present on labs done on 01/01/2018.  This is chronic as we have seen on recent lab work. Plan: Coordinated same-day thoracentesis for outpatient procedure, will complete an additional thoracentesis on 6/17  01/20/18 OV  Pleasant patient reports to office with 2-week follow-up after completing bilateral thoracentesis (6/14 and 6/17) showing transudate of fluid.  This is the second time patient has needed to have bilateral thoracentesis performed.  Since last appointment patient has also completed abdominal ultrasound.  Patient spouse reports office visit today reporting that breathing and symptoms were improved after thoracentesis was completed on 6/14 as well as 6/17.  Patient also reporting that over the last 4 days he started to feel like he is getting a little bit more short of breath although it is not "bad yet". Patient and wife are very concerned about Lasix dose and are requesting for it to be decreased as they do not feel the 80 mg has drastically improved his health outcomes. The chest x-ray completed before patient appointment today as well as lab work. Plan: Close follow up, repeat chest xray, can decrease lasix to 60mg  with 20mg  prn dose based on weight increase   Tests:  PFT 04/21/2014--FEV1 2.18 (98%), FEV1 73, TLC 6.45 (96%), DLCO 74%, no BD  Imaging:  CT chest 12/27/2015-test patchy consolidation LLL, GGO space 1.9 cm R UL, patchy GGO RML and RLL  CT  chest 06/27/2016--GGO is resolved, no change in nodules  11/13/2017-chest x-ray-small pleural effusions and lower lobe opacity favoring atelectasis  11/22/2017- chest x-ray- little change from 4/24 x-ray, minimal improvement in the pleural effusions, pneumonia at left lung base is definite consideration 12/05/2017-CT chest with contrast- moderately large bilateral pleural effusions which appear free-flowing, resulting in partial atelectasis in both lower lobes >>>small noncalcified lung nodules with a calcified granuloma in the right middle lobe these changes are most likely postinflammatory or postinfectious in origin >>>small pericardial effusion 12/08/2017-chest x-ray- no significant residual pleural fluid, no evidence of pneumothorax or reexpansion edema, no acute findings post thoracentesis 12/13/2017-chest x-ray- bilateral small pleural effusion 01/01/2018-chest x-ray-some increase in bilateral pleural effusions 01/06/2018- chest x-ray-significant reduction in left pleural effusion is noted following thoracentesis no pneumothorax is seen, small right effusion is again noted   Cardiac:  Echo 07/27/2013--EF 60 to 65% 12/04/2017- echo-grade 1 diastolic dysfunction, EF 93-79 percent   Labs:  11/13/17 - Sodium level  128 11/22/17 - Sodium level 132-hyponatremia resolving 11/13/17-BNP-31 12/06/2017-BNP-45.5 12/07/2017-protein electrophoresis-total protein low 5.6 01/01/2018- sodium 128 01/01/2018-bnp 60  01/20/18- CMP- albumin 3.2, total protein 6, GFR 84 - resolved hyponatremia  01/20/2018-TSH-5.91  Micro:  12/07/2017- fungal sputum culture-no fungus observed  12/07/17 - Sputum culture -rare squamous epithelial  cells present, few gram-positive cocci in chains   Chart Review:  GI Tests: 04/18/16 - Upper Endoscopy and [facility]    12/06/2017-hospitalization-pleural effusion, discharge 12/08/2017 >>>Follow-up on pleural effusion fluid as well as sputum culture results >>>12/07/2017- right sided thoracentesis  performed-1.7 L removed fluid consistent with transudate, 960 WBCs with 57% lymphs, Gram stain does not show any organisms, No malignant cells identified in right pleural fluid >>>12/08/2017- left-sided thoracentesis done-1.7 L again removed >>>Hyponatremia present on admission to hospital, given 1 L normal saline to help bring up he is asymptomatic  12/11/2017-swallow eval- mild aspiration risk no treatment changes at this time, regular solids and thin liquid   01/06/2018-ultrasound-guided diagnostic and therapeutic left thoracentesis-1.5 L of yellow fluid 01/03/2018- ultrasound-guided right thoracentesis total of approximately 2 L of clear yellow fluid was removed  01/15/2018-ultrasound liver Doppler- portable portal, hepatic, and splenic veins are patent, minimal ascites, bilateral pleural effusions  01/22/2018- cardiology-Dr. Geraldo Pitter Once pleural effusion has been happening calls to schedule an exercise stress Cardiolite Follow-up on as-needed basis      01/24/18 OV Pleasant 84 year old patient seen in office today.  Patient seen for follow-up for his bilateral pleural effusions.  Patient has been seen by cardiology.  Cardiology would like to proceed forward with a stress test once patient has his next diagnostic and therapeutic thoracentesis.  Patient reports increased shortness of breath and dyspnea over the past 4 days.  Patient reports adherence to low-salt diet as well as to taking Lasix daily.    No Known Allergies  Immunization History  Administered Date(s) Administered  . Influenza, High Dose Seasonal PF 04/22/2017  . Influenza,inj,Quad PF,6+ Mos 04/23/2015  . Pneumococcal Conjugate-13 07/23/2016  . Pneumococcal Polysaccharide-23 11/13/2012    Past Medical History:  Diagnosis Date  . Actinic keratoses   . Arthritis   . Colon polyps   . Dyspnea 07/13/2013   BNP normal  . Edema 07/13/2013  . GERD (gastroesophageal reflux disease)    omeprazole 1-2 times daily  . H/O  TIA (transient ischemic attack) and stroke    on statins and plavix -saw Dr.Sethi in the past  . History of BPH   . History of TIA (transient ischemic attack) 07/13/2013  . Lumbar radiculopathy   . Need for shingles vaccine   . Optic neuritis 10/2015   left eye  . Perennial allergic rhinitis   . Polymyalgia rheumatica ([place]) 07/13/2013   Daily prednisone  . Restless leg syndrome   . Retinal disease    right eye since birth   . Shingles   . Temporal arteritis ([place]) 10/2015    Tobacco History: Social History   Tobacco Use  Smoking Status Former Smoker  . Packs/day: 1.00  . Years: 15.00  . Pack years: 15.00  . Types: Cigarettes  . Last attempt to quit: 07/23/1975  . Years since quitting: 42.5  Smokeless Tobacco Never Used   Counseling given: Yes Continue not smoking.  Outpatient Encounter Medications as of 01/24/2018  Medication Sig  . acetaminophen (TYLENOL) 325 MG tablet Take 650 mg by mouth every 6 (six) hours as needed for mild pain.   . cetirizine (ZYRTEC) 10 MG tablet Take 10 mg by mouth daily as needed. Reported on 12/30/2015  . clopidogrel (PLAVIX) 75 MG tablet Take 75 mg by mouth daily with breakfast.  . dextromethorphan-guaiFENesin (MUCINEX DM) 30-600 MG 12hr tablet Take 1 tablet by mouth 2 (two) times daily.  Marland Kitchen doxazosin (CARDURA) 8 MG tablet Take 8 mg by mouth daily.  Marland Kitchen  famotidine (PEPCID) 20 MG tablet Take 20 mg by mouth at bedtime.  . fluticasone (VERAMYST) 27.5 MCG/SPRAY nasal spray Place 2 sprays into the nose daily as needed.   . furosemide (LASIX) 20 MG tablet Take 1 tablet (20 mg total) by mouth daily. (Patient taking differently: Take 60 mg by mouth daily. )  . omeprazole (PRILOSEC) 20 MG capsule Take 1 capsule (20 mg total) by mouth daily.  . simvastatin (ZOCOR) 20 MG tablet Take 20 mg by mouth daily.   No facility-administered encounter medications on file as of 01/24/2018.      Review of Systems  Review of Systems  Constitutional: Positive for fatigue.   HENT: Negative for congestion, postnasal drip, sinus pressure and sinus pain.   Respiratory: Positive for shortness of breath and wheezing.   Cardiovascular: Negative for chest pain, palpitations and leg swelling.  Gastrointestinal: Negative for abdominal distention, diarrhea, nausea and vomiting.  Genitourinary: Negative for frequency, hematuria and urgency.  Musculoskeletal: Negative for gait problem.  Allergic/Immunologic: Positive for environmental allergies.  Neurological: Positive for weakness.  All other systems reviewed and are negative.     Physical Exam  BP 110/60   Pulse 84   Ht 5\' 6"  (1.676 m)   Wt 154 lb 6.4 oz (70 kg)   SpO2 97%   BMI 24.92 kg/m   Wt Readings from Last 5 Encounters:  01/24/18 154 lb 6.4 oz (70 kg)  01/22/18 154 lb 1.9 oz (69.9 kg)  01/20/18 152 lb (68.9 kg)  01/03/18 157 lb 12.8 oz (71.6 kg)  01/01/18 163 lb (73.9 kg)     Physical Exam  Constitutional: He is well-developed, well-nourished, and in no distress. No distress.  HENT:  Head: Normocephalic and atraumatic.  Right Ear: External ear normal.  Left Ear: External ear normal.  Nose: Nose normal.  Mouth/Throat: Oropharynx is clear and moist. No oropharyngeal exudate.  Eyes: Pupils are equal, round, and reactive to light.  Neck: Normal range of motion.  Cardiovascular: Normal rate, regular rhythm and normal heart sounds.  No murmur heard. Pulmonary/Chest: Effort normal. No respiratory distress. He has decreased breath sounds (Significantly diminished in the bases) in the right lower field and the left lower field. He has no wheezes.  Abdominal: Soft. Bowel sounds are normal.  Musculoskeletal: Normal range of motion.  Lymphadenopathy:    He has no cervical adenopathy.  Neurological: He is alert. Gait normal.  Skin: Skin is warm and dry. He is not diaphoretic.  Psychiatric: Mood, memory, affect and judgment normal.  Nursing note and vitals reviewed.      Lab Results:  CBC      Component Value Date/Time   WBC 4.5 01/01/2018 1103   RBC 4.19 (L) 01/01/2018 1103   HGB 13.6 01/01/2018 1103   HCT 40.3 01/01/2018 1103   PLT 307.0 01/01/2018 1103   MCV 96.3 01/01/2018 1103   MCH 31.7 12/07/2017 0400   MCHC 33.8 01/01/2018 1103   RDW 13.8 01/01/2018 1103   LYMPHSABS 1.0 01/01/2018 1103   MONOABS 0.5 01/01/2018 1103   EOSABS 0.0 01/01/2018 1103   BASOSABS 0.0 01/01/2018 1103    BMET    Component Value Date/Time   NA 135 01/20/2018 1033   K 3.7 01/20/2018 1033   CL 100 01/20/2018 1033   CO2 29 01/20/2018 1033   GLUCOSE 80 01/20/2018 1033   BUN 18 01/20/2018 1033   CREATININE 0.91 01/20/2018 1033   CALCIUM 8.0 (L) 01/20/2018 1033   GFRNONAA >60 12/08/2017 1259  GFRAA >60 12/08/2017 1259    BNP    Component Value Date/Time   BNP 45.5 12/06/2017 1412    ProBNP    Component Value Date/Time   PROBNP 60.0 01/01/2018 1103    Imaging: Dg Chest 1 View  Result Date: 01/24/2018 CLINICAL DATA:  Status post left thoracentesis. EXAM: CHEST  1 VIEW COMPARISON:  Radiographs of same day. FINDINGS: Stable cardiomediastinal silhouette. No pneumothorax is noted. Left pleural effusion is significantly smaller status post thoracentesis. Stable mild right pleural effusion is noted with associated atelectasis or infiltrate. Bony thorax is unremarkable. IMPRESSION: No pneumothorax is noted. Left pleural effusion is significantly smaller status post thoracentesis. Electronically Signed   By: Marijo Conception, M.D.   On: 01/24/2018 12:28   Dg Chest 1 View  Result Date: 01/06/2018 CLINICAL DATA:  Status post thoracentesis on the left EXAM: CHEST  1 VIEW COMPARISON:  01/03/2017 FINDINGS: Cardiac shadow is stable. Significant reduction in left pleural effusion is noted following thoracentesis. No pneumothorax is seen. Small right effusion is again noted. Some atelectasis/infiltrate in the left base is noted. IMPRESSION: No pneumothorax following left thoracentesis.  Electronically Signed   By: Inez Catalina M.D.   On: 01/06/2018 15:36   Dg Chest 1 View  Result Date: 01/03/2018 CLINICAL DATA:  Initial evaluation status post thoracentesis. EXAM: CHEST  1 VIEW COMPARISON:  Prior radiograph from earlier the same day. FINDINGS: Cardiac and mediastinal silhouettes are stable in size and contour, and remain within normal limits. Lungs normally inflated. Interval decrease in a right pleural effusion status post thoracentesis, with only a small layering effusion now seen. No pneumothorax or other complication. Improved right basilar atelectasis. A moderate layering left pleural effusion with associated atelectasis is unchanged. No new focal airspace disease. Osseous structures unchanged. IMPRESSION: 1. Interval improvement in small layering right pleural effusion status post thoracentesis. No pneumothorax or other complication. 2. Unchanged moderate left pleural effusion with associated atelectasis. Electronically Signed   By: Jeannine Boga M.D.   On: 01/03/2018 13:13   Dg Chest 2 View  Result Date: 01/24/2018 CLINICAL DATA:  84 year old male with persistent shortness of breath, cough and a history of bilateral pleural effusions EXAM: CHEST - 2 VIEW COMPARISON:  Most recent prior chest x-ray 01/20/2018 FINDINGS: Persistent and unchanged small bilateral pleural effusions. Linear opacities in both lung bases are most consistent with areas of atelectasis. The upper and mid lung zones are clear. No suspicious nodules or masses. The cardiac silhouette is largely obscured by the bibasilar atelectasis. The mediastinal contours are normal. Osseous structures are unremarkable. IMPRESSION: 1. Persistent and unchanged small bilateral pleural effusions with associated bibasilar atelectasis. Electronically Signed   By: Jacqulynn Cadet M.D.   On: 01/24/2018 09:43   Dg Chest 2 View  Result Date: 01/20/2018 CLINICAL DATA:  Shortness of breath, pleural effusions. EXAM: CHEST - 2 VIEW  COMPARISON:  Radiograph of January 06, 2018. FINDINGS: Stable cardiomediastinal silhouette. No pneumothorax is noted. Moderate bilateral pleural effusions are noted which are increased compared to prior exam, with underlying atelectasis or infiltrate. Bony thorax is unremarkable. IMPRESSION: Increased bilateral pleural effusions are noted with associated atelectasis or infiltrate. Electronically Signed   By: Marijo Conception, M.D.   On: 01/20/2018 10:41   Dg Chest 2 View  Result Date: 01/03/2018 CLINICAL DATA:  Pleural effusion EXAM: CHEST - 2 VIEW COMPARISON:  01/01/2018 FINDINGS: Small bilateral pleural effusions with bibasilar atelectasis, stable since prior study. Heart is likely normal in size. No acute bony  abnormality. IMPRESSION: Stable bilateral effusions and bibasilar atelectasis. Electronically Signed   By: Rolm Baptise M.D.   On: 01/03/2018 10:32   Dg Chest 2 View  Result Date: 01/01/2018 CLINICAL DATA:  History of bilateral pleural effusions. Increasing shortness of breath. EXAM: CHEST - 2 VIEW COMPARISON:  PA and lateral chest 12/13/2017. FINDINGS: Small bilateral pleural effusions and basilar airspace disease have worsened since the prior examination. There is no evidence of pulmonary edema. Cardiac silhouette is obscured. No pneumothorax. No focal bony abnormality. IMPRESSION: Some increase in small bilateral pleural effusions and basilar airspace disease, likely atelectasis. Electronically Signed   By: Inge Rise M.D.   On: 01/01/2018 13:02   US Liver Doppler  Result Date: 01/17/2018 EXAM: DUPLEX ULTRASOUND OF LIVER TECHNIQUE: Color and duplex Doppler ultrasound was performed to evaluate the hepatic in-flow and out-flow vessels. COMPARISON:  None. FINDINGS: Portal Vein Velocities Main:  36 cm/sec Right:  20 cm/sec Left:  17 cm/sec Hepatic Vein Velocities Right:  38 cm/sec Middle:  26 cm/sec Left:  31 cm/sec Hepatic Artery Velocity:  84 cm/sec Splenic Vein Velocity:  15 cm/sec Varices:  Absent Ascites: Trace ascites about the liver is noted. Bilateral pleural effusions are present. Hepatic and portal veins are patent with normal directionality of flow. Hepatic veins are hepatofugal and portal veins are hepatopetal inflow. Splenic vein is also patent with normal directionality of flow. IMPRESSION: Portal, hepatic, and splenic veins are patent. Minimal ascites. Bilateral pleural effusions. Electronically Signed   By: Marybelle Killings M.D.   On: 01/17/2018 10:03   US Thoracentesis Asp Pleural Space W/img Guide  Result Date: 01/06/2018 INDICATION: Patient with history of CHF, recurrent bilateral pleural effusions; request made for diagnostic and therapeutic left thoracentesis up to 1.5 liters. EXAM: ULTRASOUND GUIDED DIAGNOSTIC AND THERAPEUTIC LEFT THORACENTESIS MEDICATIONS: None COMPLICATIONS: None immediate. PROCEDURE: An ultrasound guided thoracentesis was thoroughly discussed with the patient and questions answered. The benefits, risks, alternatives and complications were also discussed. The patient understands and wishes to proceed with the procedure. Written consent was obtained. Ultrasound was performed to localize and mark an adequate pocket of fluid in the left chest. The area was then prepped and draped in the normal sterile fashion. 1% Lidocaine was used for local anesthesia. Under ultrasound guidance a 6 Fr Safe-T-Centesis catheter was introduced. Thoracentesis was performed. The catheter was removed and a dressing applied. FINDINGS: A total of approximately 1.5 liters of yellow fluid was removed. Samples were sent to the laboratory as requested by the clinical team. IMPRESSION: Successful ultrasound guided diagnostic and therapeutic left thoracentesis yielding 1.5 liters of pleural fluid. Read by: Rowe Robert, PA-C Electronically Signed   By: Sandi Mariscal M.D.   On: 01/06/2018 15:27   US Thoracentesis Asp Pleural Space W/img Guide  Result Date: 01/03/2018 INDICATION: Dyspnea on  exertion. Recurrent bilateral pleural effusions. Request for diagnostic and therapeutic thoracentesis. EXAM: ULTRASOUND GUIDED RIGHT THORACENTESIS MEDICATIONS: 1% lidocaine local COMPLICATIONS: None immediate. PROCEDURE: An ultrasound guided thoracentesis was thoroughly discussed with the patient and questions answered. The benefits, risks, alternatives and complications were also discussed. The patient understands and wishes to proceed with the procedure. Written consent was obtained. Ultrasound was performed to localize and mark an adequate pocket of fluid in the right chest. The area was then prepped and draped in the normal sterile fashion. 1% Lidocaine was used for local anesthesia. Under ultrasound guidance a 6 Fr Safe-T-Centesis catheter was introduced. Thoracentesis was performed. The catheter was removed and a dressing applied. FINDINGS: A  total of approximately 2 L of clear yellow fluid was removed. Samples were sent to the laboratory as requested by the clinical team. IMPRESSION: Successful ultrasound guided right thoracentesis yielding 2 L of pleural fluid. Read by: Ascencion Dike PA-C Electronically Signed   By: [name].  Shick M.D.   On: 01/03/2018 13:29     Assessment & Plan:   Is an 84 year old patient seen office today.  Patient unfortunately has recurrent bilateral pleural effusions.  Will get lab work today ANA, C-reactive protein, CCP antibody, serum LDH, rheumatoid factor, sed rate.   Will coordinate outpatient diagnostic and therapeutic thoracentesis today.  Patient had follow-up with our office in 2 weeks with a chest x-ray.  This is the third time we have had to drain his effusions.  Will refer to cardiothoracic surgery.  To discuss chronic management of the bilateral pleural effusions.    Bilateral pleural effusion Lab work today >>>Will get lab work today ANA, C-reactive protein, CCP antibody, serum LDH, rheumatoid factor, sed rate.  We will coordinate outpatient procedure for  diagnostic and therapeutic thoracentesis >>> Left today >>> Right next week   Follow-up with our office in 2 weeks with a chest x-ray  Referral to cardiothoracic surgery for chronic management of pleural effusions   Contact cardiology and schedule stress test as discussed at your 01/22/2018 appointment  Continue high protein boost   This appointment was 27 minutes long with over 50% of that time in direct face-to-face patient care: Assessment, plan of care discussion, coordination of care based off of current referrals.  Lauraine Rinne, NP 01/24/2018

## 2018-01-24 NOTE — Telephone Encounter (Signed)
Patient seen today by Aaron Edelman NP Left thoracentesis was scheduled for today Right thoracentesis scheduled for 01/30/18  Spouse emailing office to see if the right can be done this coming Monday Spoke with Aaron Edelman NP, this is fine - no clinical reason, must have been an availability issue  LMOM TCB for [place] with IR at [facility] @ [phone]  E-mail sent to patient's spouse informing her of the above

## 2018-01-24 NOTE — Patient Instructions (Addendum)
Lab work today >>>Will get lab work today ANA, C-reactive protein, CCP antibody, serum LDH, rheumatoid factor, sed rate.  We will coordinate outpatient procedure for diagnostic and therapeutic thoracentesis >>> Left today >>> Right next week   Follow-up with our office in 2 weeks with a chest x-ray  Referral to cardiothoracic surgery   Contact cardiology and schedule stress test as discussed at your 01/22/2018 appointment  Continue high protein boost   Please contact the office if your symptoms worsen or you have concerns that you are not improving.   Thank you for choosing [facility] for your healthcare, and for allowing [place] to partner with you on your healthcare journey. I am thankful to be able to provide care to you today.   [name] FNP-C

## 2018-01-24 NOTE — Procedures (Signed)
Ultrasound-guided diagnostic and therapeutic left thoracentesis performed yielding 1.8 liters of yellow fluid. No immediate complications. Follow-up chest x-ray pending. A portion of the fluid was sent to the lab for preordered studies.

## 2018-01-24 NOTE — Progress Notes (Signed)
Discussed results with patient in office.  Nothing further is needed at this time.  Calah Gershman FNP  

## 2018-01-25 LAB — AMYLASE, PLEURAL OR PERITONEAL FLUID: Amylase, Fluid: 26 U/L

## 2018-01-25 LAB — TRIGLYCERIDES, BODY FLUIDS: Triglycerides, Fluid: 12 mg/dL

## 2018-01-27 ENCOUNTER — Telehealth (HOSPITAL_COMMUNITY): Payer: Self-pay | Admitting: *Deleted

## 2018-01-27 ENCOUNTER — Encounter: Payer: Self-pay | Admitting: Pulmonary Disease

## 2018-01-27 ENCOUNTER — Ambulatory Visit (HOSPITAL_COMMUNITY)
Admission: RE | Admit: 2018-01-27 | Discharge: 2018-01-27 | Disposition: A | Discharge status: Home / Self Care | Payer: Medicare Other | Source: Ambulatory Visit | Attending: Radiology | Admitting: Radiology

## 2018-01-27 ENCOUNTER — Ambulatory Visit (HOSPITAL_COMMUNITY)
Admission: RE | Admit: 2018-01-27 | Discharge: 2018-01-27 | Disposition: A | Discharge status: Home / Self Care | Payer: Medicare Other | Source: Ambulatory Visit | Attending: Pulmonary Disease | Admitting: Pulmonary Disease

## 2018-01-27 DIAGNOSIS — J9811 Atelectasis: Secondary | ICD-10-CM | POA: Insufficient documentation

## 2018-01-27 DIAGNOSIS — J948 Other specified pleural conditions: Secondary | ICD-10-CM | POA: Diagnosis not present

## 2018-01-27 DIAGNOSIS — J90 Pleural effusion, not elsewhere classified: Secondary | ICD-10-CM | POA: Diagnosis not present

## 2018-01-27 LAB — BODY FLUID CELL COUNT WITH DIFFERENTIAL
LYMPHS FL: 62 %
Monocyte-Macrophage-Serous Fluid: 35 % — ABNORMAL LOW (ref 50–90)
Neutrophil Count, Fluid: 3 % (ref 0–25)
Total Nucleated Cell Count, Fluid: 534 cu mm (ref 0–1000)

## 2018-01-27 LAB — AMYLASE, PLEURAL OR PERITONEAL FLUID: Amylase, Fluid: 24 U/L

## 2018-01-27 LAB — GLUCOSE, PLEURAL OR PERITONEAL FLUID: Glucose, Fluid: 108 mg/dL

## 2018-01-27 LAB — ANA: Anti Nuclear Antibody(ANA): POSITIVE — AB

## 2018-01-27 LAB — PH, BODY FLUID: pH, Body Fluid: 7.6

## 2018-01-27 LAB — ALBUMIN, PLEURAL OR PERITONEAL FLUID: Albumin, Fluid: 2 g/dL

## 2018-01-27 LAB — LACTATE DEHYDROGENASE, PLEURAL OR PERITONEAL FLUID: LD FL: 67 U/L — AB (ref 3–23)

## 2018-01-27 LAB — GRAM STAIN

## 2018-01-27 LAB — RHEUMATOID FACTOR

## 2018-01-27 LAB — ANTI-NUCLEAR AB-TITER (ANA TITER): ANA Titer 1: 1:40 {titer} — ABNORMAL HIGH

## 2018-01-27 LAB — PROTEIN, PLEURAL OR PERITONEAL FLUID: TOTAL PROTEIN, FLUID: 3.2 g/dL

## 2018-01-27 LAB — LACTATE DEHYDROGENASE: LDH: 120 U/L (ref 120–250)

## 2018-01-27 LAB — CYCLIC CITRUL PEPTIDE ANTIBODY, IGG: Cyclic Citrullin Peptide Ab: <16 UNITS

## 2018-01-27 MED ORDER — LIDOCAINE HCL 1 % IJ SOLN
INTRAMUSCULAR | Status: AC
  Filled 2018-01-27: qty 10

## 2018-01-27 NOTE — Progress Notes (Signed)
Serum LDH has come back (this is the lab test we needed drawn the same day as your thoracentesis).  Still showing transudative fluid make-up.   I have discussed this case with Dr. [name]. We both agree to proceed forward with cardiology test and cardiothoracic referral for chronic management of these recurrent pleural effusions.   [name] FNP

## 2018-01-27 NOTE — Telephone Encounter (Signed)
Patient given detailed instructions per Myocardial Perfusion Study Information Sheet for the test on 01/29/18 at 0715. Patient notified to arrive 15 minutes early and that it is imperative to arrive on time for appointment to keep from having the test rescheduled.  If you need to cancel or reschedule your appointment, please call the office within 24 hours of your appointment. . Patient verbalized understanding.[name], [name]

## 2018-01-27 NOTE — Procedures (Signed)
PROCEDURE SUMMARY:  Successful US guided right thoracentesis. Yielded 2 L of clear yellow fluid. Pt tolerated procedure well. No immediate complications.  Specimen was sent for labs. CXR ordered.  Ascencion Dike PA-C 01/27/2018 12:05 PM

## 2018-01-27 NOTE — Progress Notes (Signed)
Your lab results have come back.  ANA is positive but is not elevated enough to be specific of a diagnosis.  This does not change our plan of care or focus.  Sed rate, CCP, rheumatoid factor all normal.  We will continue to monitor as you are the fluid results come back.  So far they are appearing transudative as the last collections have been as well.  To continue with cardiothoracic follow-up and follow-up with cardiology.  [name] FNP

## 2018-01-28 LAB — PH, BODY FLUID: PH, BODY FLUID: 7.4

## 2018-01-28 LAB — CHOLESTEROL, BODY FLUID: Cholesterol, Fluid: 50 mg/dL

## 2018-01-28 LAB — TRIGLYCERIDES, BODY FLUIDS: TRIGLYCERIDES FL: 12 mg/dL

## 2018-01-29 ENCOUNTER — Ambulatory Visit (HOSPITAL_COMMUNITY): Payer: Medicare Other | Attending: Cardiology

## 2018-01-29 DIAGNOSIS — I2510 Atherosclerotic heart disease of native coronary artery without angina pectoris: Secondary | ICD-10-CM | POA: Insufficient documentation

## 2018-01-29 DIAGNOSIS — I2584 Coronary atherosclerosis due to calcified coronary lesion: Secondary | ICD-10-CM | POA: Insufficient documentation

## 2018-01-29 DIAGNOSIS — J90 Pleural effusion, not elsewhere classified: Secondary | ICD-10-CM | POA: Diagnosis not present

## 2018-01-29 LAB — MYOCARDIAL PERFUSION IMAGING
CHL CUP NUCLEAR SSS: 7
CSEPEDS: 30 s
CSEPEW: 6.7 METS
CSEPHR: 91 %
Exercise duration (min): 5 min
LV dias vol: 85 mL (ref 62–150)
LV sys vol: 29 mL
MPHR: 137 {beats}/min
Peak HR: 126 {beats}/min
RATE: 0.4
Rest HR: 73 {beats}/min
SDS: 2
SRS: 6
TID: 0.99

## 2018-01-29 LAB — CULTURE, BODY FLUID-BOTTLE: CULTURE: NO GROWTH

## 2018-01-29 MED ORDER — TECHNETIUM TC 99M TETROFOSMIN IV KIT
32.4000 | PACK | Freq: Once | INTRAVENOUS | Status: AC | PRN
  Administered 2018-01-29: 32.4 via INTRAVENOUS
  Filled 2018-01-29: qty 33

## 2018-01-29 MED ORDER — TECHNETIUM TC 99M TETROFOSMIN IV KIT
10.1000 | PACK | Freq: Once | INTRAVENOUS | Status: AC | PRN
  Administered 2018-01-29: 10.1 via INTRAVENOUS
  Filled 2018-01-29: qty 11

## 2018-01-30 ENCOUNTER — Ambulatory Visit (HOSPITAL_COMMUNITY): Payer: Medicare Other

## 2018-01-31 ENCOUNTER — Other Ambulatory Visit: Payer: Self-pay | Admitting: Cardiology

## 2018-01-31 ENCOUNTER — Encounter: Payer: Self-pay | Admitting: Cardiology

## 2018-01-31 ENCOUNTER — Ambulatory Visit (INDEPENDENT_AMBULATORY_CARE_PROVIDER_SITE_OTHER): Payer: Medicare Other | Admitting: Cardiology

## 2018-01-31 VITALS — BP 108/54 | HR 74 | Ht 66.0 in | Wt 157.8 lb

## 2018-01-31 DIAGNOSIS — R931 Abnormal findings on diagnostic imaging of heart and coronary circulation: Secondary | ICD-10-CM

## 2018-01-31 DIAGNOSIS — I2510 Atherosclerotic heart disease of native coronary artery without angina pectoris: Secondary | ICD-10-CM | POA: Diagnosis not present

## 2018-01-31 DIAGNOSIS — R0609 Other forms of dyspnea: Secondary | ICD-10-CM | POA: Diagnosis not present

## 2018-01-31 DIAGNOSIS — R9389 Abnormal findings on diagnostic imaging of other specified body structures: Secondary | ICD-10-CM | POA: Insufficient documentation

## 2018-01-31 DIAGNOSIS — I2584 Coronary atherosclerosis due to calcified coronary lesion: Secondary | ICD-10-CM | POA: Diagnosis not present

## 2018-01-31 DIAGNOSIS — Z01818 Encounter for other preprocedural examination: Secondary | ICD-10-CM | POA: Diagnosis not present

## 2018-01-31 DIAGNOSIS — I700 Atherosclerosis of aorta: Secondary | ICD-10-CM | POA: Diagnosis not present

## 2018-01-31 NOTE — Progress Notes (Signed)
Cardiology Office Note:    Date:  01/31/2018   [name], DOB July 17, 1932, MRN [id number]  PCP:  Wenda Low, MD  Cardiologist:  Jenean Lindau, MD   Referring MD: Wenda Low, MD    ASSESSMENT:    1. Coronary artery calcification   2. Aortic atherosclerosis ([place])   3. Dyspnea on exertion   4. Abnormal nuclear cardiac imaging test    PLAN:    In order of problems listed above:  1. Primary prevention stressed with patient.  Importance of compliance with diet and medications stressed the vocalized understanding. 2. I gave the patient several options  for evaluation of coronary anatomy.  I discussed coronary CT angiography and conventional coronary angiography.I discussed coronary angiography and left heart catheterization with the patient at extensive length. Procedure, benefits and potential risks were explained. Patient had multiple questions which were answered to the patient's satisfaction. Patient agreed and consented for the procedure. Further recommendations will be made based on the findings of the coronary angiography. In the interim. The patient has any significant symptoms he knows to go to the nearest emergency room.    Medication Adjustments/Labs and Tests Ordered: Current medicines are reviewed at length with the patient today.  Concerns regarding medicines are outlined above.  No orders of the defined types were placed in this encounter.  No orders of the defined types were placed in this encounter.    Chief Complaint  Patient presents with  . Follow up stress test     History of Present Illness:    Stuart Watson is a 84 y.o. male.  The patient was evaluated by me for shortness of breath.  Some of his problem is that he has recurrent pleural effusion and has undergone pleural tap for the same reason.  Echocardiogram was unremarkable and nuclear stress testing was abnormal.  The patient is here to discuss his results.  At the time of my  evaluation, the patient is alert awake oriented and in no distress.  Past Medical History:  Diagnosis Date  . Actinic keratoses   . Arthritis   . Colon polyps   . Dyspnea 07/13/2013   BNP normal  . Edema 07/13/2013  . GERD (gastroesophageal reflux disease)    omeprazole 1-2 times daily  . H/O TIA (transient ischemic attack) and stroke    on statins and plavix -saw Dr.Sethi in the past  . History of BPH   . History of TIA (transient ischemic attack) 07/13/2013  . Lumbar radiculopathy   . Need for shingles vaccine   . Optic neuritis 10/2015   left eye  . Perennial allergic rhinitis   . Polymyalgia rheumatica ([place]) 07/13/2013   Daily prednisone  . Restless leg syndrome   . Retinal disease    right eye since birth   . Shingles   . Temporal arteritis ([place]) 10/2015    Past Surgical History:  Procedure Laterality Date  . BRAVO [place] STUDY N/A 04/18/2016   Procedure: BRAVO [place];  Surgeon: Wilford Corner, MD;  Location: [facility];  Service: Endoscopy;  Laterality: N/A;  . COLONOSCOPY    . drropy eyelid surgery    . ESOPHAGOGASTRODUODENOSCOPY (EGD) WITH PROPOFOL N/A 04/18/2016   Procedure: ESOPHAGOGASTRODUODENOSCOPY (EGD) WITH PROPOFOL;  Surgeon: Wilford Corner, MD;  Location: [facility] ENDOS[facility];  Service: Endoscopy;  Laterality: N/A;  . VASECTOMY      Current Medications: Current Meds  Medication Sig  . clopidogrel (PLAVIX) 75 MG tablet Take 75 mg by  mouth daily with breakfast.  . doxazosin (CARDURA) 8 MG tablet Take 8 mg by mouth daily.  . famotidine (PEPCID) 20 MG tablet Take 20 mg by mouth at bedtime.  . fluticasone (VERAMYST) 27.5 MCG/SPRAY nasal spray Place 2 sprays into the nose daily as needed.   . furosemide (LASIX) 20 MG tablet Take 1 tablet (20 mg total) by mouth daily. (Patient taking differently: Take 40 mg by mouth daily. )  . omeprazole (PRILOSEC) 20 MG capsule Take 1 capsule (20 mg total) by mouth daily.  . simvastatin (ZOCOR) 20 MG tablet Take 20 mg by mouth  daily.     Allergies:   Patient has no known allergies.   Social History   Socioeconomic History  . Marital status: Married    Spouse name: Not on file  . Number of children: 2  . Years of education: Not on file  . Highest education level: Not on file  Occupational History  . Occupation: retired  Scientific laboratory technician  . Financial resource strain: Not on file  . Food insecurity:    Worry: Not on file    Inability: Not on file  . Transportation needs:    Medical: Not on file    Non-medical: Not on file  Tobacco Use  . Smoking status: Former Smoker    Packs/day: 1.00    Years: 15.00    Pack years: 15.00    Types: Cigarettes    Last attempt to quit: 07/23/1975    Years since quitting: 42.5  . Smokeless tobacco: Never Used  Substance and Sexual Activity  . Alcohol use: Yes    Alcohol/week: 4.2 oz    Types: 7 Glasses of wine per week    Comment: 1 glass red wine daily  . Drug use: No  . Sexual activity: Not on file  Lifestyle  . Physical activity:    Days per week: Not on file    Minutes per session: Not on file  . Stress: Not on file  Relationships  . Social connections:    Talks on phone: Not on file    Gets together: Not on file    Attends religious service: Not on file    Active member of club or organization: Not on file    Attends meetings of clubs or organizations: Not on file    Relationship status: Not on file  Other Topics Concern  . Not on file  Social History Narrative  . Not on file     Family History: The patient's family history includes Breast cancer in his mother; Coronary artery disease in his father and mother; Diabetes in his father and mother; Berenice Primas' disease in his sister; Heart attack in his father; Stroke in his mother.  ROS:   Please see the history of present illness.    All other systems reviewed and are negative.  EKGs/Labs/Other Studies Reviewed:    The following studies were reviewed today: I discussed my findings with the patient at  extensive length   Recent Labs: 12/06/2017: B Natriuretic Peptide 45.5 01/01/2018: Hemoglobin 13.6; Platelets 307.0; Pro B Natriuretic peptide (BNP) 60.0 01/20/2018: ALT 8; BUN 18; Creatinine, Ser 0.91; Potassium 3.7; Sodium 135; TSH 5.91  Recent Lipid Panel No results found for: CHOL, TRIG, HDL, CHOLHDL, VLDL, LDLCALC, LDLDIRECT  Physical Exam:    VS:  BP (!) 108/54   Pulse 74   Ht 5\' 6"  (1.676 m)   Wt 157 lb 12.8 oz (71.6 kg)   SpO2 97%   BMI  25.47 kg/m     Wt Readings from Last 3 Encounters:  01/31/18 157 lb 12.8 oz (71.6 kg)  01/29/18 154 lb (69.9 kg)  01/24/18 154 lb 6.4 oz (70 kg)     GEN: Patient is in no acute distress HEENT: Normal NECK: No JVD; No carotid bruits LYMPHATICS: No lymphadenopathy CARDIAC: Hear sounds regular, 2/6 systolic murmur at the apex. RESPIRATORY:  Clear to auscultation without rales, wheezing or rhonchi  ABDOMEN: Soft, non-tender, non-distended MUSCULOSKELETAL:  No edema; No deformity  SKIN: Warm and dry NEUROLOGIC:  Alert and oriented x 3 PSYCHIATRIC:  Normal affect   Signed, Jenean Lindau, MD  01/31/2018 1:54 PM    [facility]

## 2018-01-31 NOTE — Patient Instructions (Addendum)
Medication Instructions:  Your physician recommends that you continue on your current medications as directed. Please refer to the Current Medication list given to you today.   Labwork: Your physician recommends that you return for lab work today: CBC, BMP.   Testing/Procedures: You had an EKG today.     [facility] [place] [address], [place] [address] Dept: [phone] Loc: [place]  01/31/2018  You are scheduled for a Cardiac Catheterization on Wednesday, July 17 with Dr. Peter Martinique.  1. Please arrive at the [facility] (Main Entrance A) at [facility]: [address] [place], [place] [address] at 6:30 AM (two hours before your procedure to ensure your preparation). Free valet parking service is available.   Special note: Every effort is made to have your procedure done on time. Please understand that emergencies sometimes delay scheduled procedures.  2. Diet: Do not eat or drink anything after midnight prior to your procedure except sips of water to take medications.  3. Labs: None needed.  4. Medication instructions in preparation for your procedure:  On the morning of your procedure, take your Plavix/Clopidogrel and any morning medicines NOT listed above.  You may use sips of water.  5. Plan for one night stay--bring personal belongings. 6. Bring a current list of your medications and current insurance cards. 7. You MUST have a responsible person to drive you home. 8. Someone MUST be with you the first 24 hours after you arrive home or your discharge will be delayed. 9. Please wear clothes that are easy to get on and off and wear slip-on shoes.  Thank you for allowing [place] to care for you!   -- [facility]   Follow-Up: Your physician recommends that you schedule a follow-up appointment: to be determined after heart  catheterization is complete.   If you need a refill on your cardiac medications before your next appointment, please call your pharmacy.   Thank you for choosing [facility]! Robyne Peers, RN [phone]     Coronary Angiogram With Stent Coronary angiogram with stent placement is a procedure to widen or open a narrow blood vessel of the heart (coronary artery). Arteries may become blocked by cholesterol buildup (plaques) in the lining of the wall. When a coronary artery becomes partially blocked, blood flow to that area decreases. This may lead to chest pain or a heart attack (myocardial infarction). A stent is a small piece of metal that looks like mesh or a spring. Stent placement may be done as treatment for a heart attack or right after a coronary angiogram in which a blocked artery is found. Let your health care provider know about:  Any allergies you have.  All medicines you are taking, including vitamins, herbs, eye drops, creams, and over-the-counter medicines.  Any problems you or family members have had with anesthetic medicines.  Any blood disorders you have.  Any surgeries you have had.  Any medical conditions you have.  Whether you are pregnant or may be pregnant. What are the risks? Generally, this is a safe procedure. However, problems may occur, including:  Damage to the heart or its blood vessels.  A return of blockage.  Bleeding, infection, or bruising at the insertion site.  A collection of blood under the skin (hematoma) at the insertion site.  A blood clot in another part of the body.  Kidney injury.  Allergic  reaction to the dye or contrast that is used.  Bleeding into the abdomen (retroperitoneal bleeding).  What happens before the procedure? Staying hydrated Follow instructions from your health care provider about hydration, which may include:  Up to 2 hours before the procedure - you may continue to drink clear liquids, such as  water, clear fruit juice, black coffee, and plain tea.  Eating and drinking restrictions Follow instructions from your health care provider about eating and drinking, which may include:  8 hours before the procedure - stop eating heavy meals or foods such as meat, fried foods, or fatty foods.  6 hours before the procedure - stop eating light meals or foods, such as toast or cereal.  2 hours before the procedure - stop drinking clear liquids.  Ask your health care provider about:  Changing or stopping your regular medicines. This is especially important if you are taking diabetes medicines or blood thinners.  Taking medicines such as ibuprofen. These medicines can thin your blood. Do not take these medicines before your procedure if your health care provider instructs you not to. Generally, aspirin is recommended before a procedure of passing a small, thin tube (catheter) through a blood vessel and into the heart (cardiac catheterization).  What happens during the procedure?  An IV tube will be inserted into one of your veins.  You will be given one or more of the following: ? A medicine to help you relax (sedative). ? A medicine to numb the area where the catheter will be inserted into an artery (local anesthetic).  To reduce your risk of infection: ? Your health care team will wash or sanitize their hands. ? Your skin will be washed with soap. ? Hair may be removed from the area where the catheter will be inserted.  Using a guide wire, the catheter will be inserted into an artery. The location may be in your groin, in your wrist, or in the fold of your arm (near your elbow).  A type of X-ray (fluoroscopy) will be used to help guide the catheter to the opening of the arteries in the heart.  A dye will be injected into the catheter, and X-rays will be taken. The dye will help to show where any narrowing or blockages are located in the arteries.  A tiny wire will be guided to the  blocked spot, and a balloon will be inflated to make the artery wider.  The stent will be expanded and will crush the plaques into the wall of the vessel. The stent will hold the area open and improve the blood flow. Most stents have a drug coating to reduce the risk of the stent narrowing over time.  The artery may be made wider using a drill, laser, or other tools to remove plaques.  When the blood flow is better, the catheter will be removed. The lining of the artery will grow over the stent, which stays where it was placed. This procedure may vary among health care providers and hospitals. What happens after the procedure?  If the procedure is done through the leg, you will be kept in bed lying flat for about 6 hours. You will be instructed to not bend and not cross your legs.  The insertion site will be checked frequently.  The pulse in your foot or wrist will be checked frequently.  You may have additional blood tests, X-rays, and a test that records the electrical activity of your heart (electrocardiogram, or ECG). This information is  not intended to replace advice given to you by your health care provider. Make sure you discuss any questions you have with your health care provider. Document Released: 01/13/2003 Document Revised: 03/08/2016 Document Reviewed: 02/12/2016 Elsevier Interactive Patient Education  [employer].

## 2018-01-31 NOTE — Addendum Note (Signed)
Addended by: Austin Miles on: 01/31/2018 02:07 PM   Modules accepted: Orders

## 2018-02-01 LAB — CBC
Hematocrit: 39.7 % (ref 37.5–51.0)
Hemoglobin: 13.4 g/dL (ref 13.0–17.7)
MCH: 32.1 pg (ref 26.6–33.0)
MCHC: 33.8 g/dL (ref 31.5–35.7)
MCV: 95 fL (ref 79–97)
PLATELETS: 319 10*3/uL (ref 150–450)
RBC: 4.17 x10E6/uL (ref 4.14–5.80)
RDW: 13.5 % (ref 12.3–15.4)
WBC: 5.8 10*3/uL (ref 3.4–10.8)

## 2018-02-01 LAB — CULTURE, BODY FLUID W GRAM STAIN -BOTTLE: Culture: NO GROWTH

## 2018-02-01 LAB — BASIC METABOLIC PANEL
BUN/Creatinine Ratio: 20 (ref 10–24)
BUN: 16 mg/dL (ref 8–27)
CALCIUM: 8.2 mg/dL — AB (ref 8.6–10.2)
CHLORIDE: 100 mmol/L (ref 96–106)
CO2: 23 mmol/L (ref 20–29)
Creatinine, Ser: 0.82 mg/dL (ref 0.76–1.27)
GFR calc Af Amer: 95 mL/min/{1.73_m2} (ref >59)
GFR calc non Af Amer: 82 mL/min/{1.73_m2} (ref >59)
GLUCOSE: 97 mg/dL (ref 65–99)
POTASSIUM: 4.8 mmol/L (ref 3.5–5.2)
Sodium: 136 mmol/L (ref 134–144)

## 2018-02-01 LAB — CHOLESTEROL, BODY FLUID: CHOL FL: 52 mg/dL

## 2018-02-03 ENCOUNTER — Ambulatory Visit (INDEPENDENT_AMBULATORY_CARE_PROVIDER_SITE_OTHER)
Admission: RE | Admit: 2018-02-03 | Discharge: 2018-02-03 | Disposition: A | Discharge status: Home / Self Care | Payer: Medicare Other | Source: Ambulatory Visit | Attending: Pulmonary Disease | Admitting: Pulmonary Disease

## 2018-02-03 ENCOUNTER — Telehealth: Payer: Self-pay | Admitting: Pulmonary Disease

## 2018-02-03 ENCOUNTER — Encounter: Payer: Self-pay | Admitting: Pulmonary Disease

## 2018-02-03 DIAGNOSIS — J90 Pleural effusion, not elsewhere classified: Secondary | ICD-10-CM

## 2018-02-03 DIAGNOSIS — R0602 Shortness of breath: Secondary | ICD-10-CM

## 2018-02-03 NOTE — Telephone Encounter (Signed)
Due to pt having the cxr completed, they did not want to go home and then have to turn right back around if VS thought pt needed to have thoracentesis performed. Per Steward Drone, pt is not breathing well and she states she is unsure if pt will be able to lie flat for the heart cath and that is why they want the thoracentesis to be performed.  Pt and family have been made aware that VS is currently seeing pts and he would look at xray as soon as he could. Vida Roller has also been made aware that they are currently waiting in the lobby.  Routing to VS.

## 2018-02-03 NOTE — Telephone Encounter (Signed)
Called and spoke with pt's daughter Steward Drone letting her know the reason to why VS wanted pt to get a cxr done before determining if a repeat thoracentesis would be beneficial for pt.  Order has been placed for pt to have the cxr.  Polly expressed understanding and stated to me pt has gained 6 pounds recently. Pt is also seeing cards and will be having a cardiac cath performed Wednesday, 7/17.  Steward Drone is wanting to know if the cxr shows that pt would benefit from a thoracentesis, could one side possibly be done today and have the other side done tomorrow, and would it still be fine for pt to have the cardiac cath performed Wednesday.  Dr.Sood, please advise.

## 2018-02-03 NOTE — Telephone Encounter (Signed)
Patient daughter Steward Drone called - advised that VS wants pt to have cxr - she is questioning why this is necessary since pt has gained 6 pounds . She wanting to have this addressed urgently because patient's cardiologist is wanted to do a heart cath with possible stent done on Wednesday. She can be reached at [phone]

## 2018-02-03 NOTE — Telephone Encounter (Signed)
There is a telephone encounter open on this matter. MyChart message will be closed.

## 2018-02-03 NOTE — Telephone Encounter (Signed)
Order has been placed. Went out to lobby to get pt and took pt to [facility].  Will close encounter.

## 2018-02-03 NOTE — Telephone Encounter (Signed)
Dg Chest 2 View  Result Date: 02/03/2018 CLINICAL DATA:  Dyspnea. Follow-up pleural effusion, status post thoracentesis January 27, 2018. EXAM: CHEST - 2 VIEW COMPARISON:  Chest radiograph January 27, 2018 FINDINGS: Similar small RIGHT and small to moderate LEFT pleural effusions with bibasilar strandy densities. Cardiomediastinal silhouette is normal with great biapical calcified pleural plaques. No pneumothorax. Soft tissue planes and included osseous structures are non suspicious. Mild degenerative change of the thoracic spine. IMPRESSION: Small RIGHT and small to moderate LEFT pleural effusion. Bibasilar atelectasis/scarring. Electronically Signed   By: Elon Alas M.D.   On: 02/03/2018 14:30     Can arrange for left thoracentesis with IR.  No studies need to be sent.

## 2018-02-03 NOTE — Telephone Encounter (Signed)
Will need to see what CXR shows first and then determine if he needs thoracentesis.  He should be able to have heart cath.  I am not sure how quick IR can get thoracentesis set up if he needs it.

## 2018-02-03 NOTE — Telephone Encounter (Signed)
He needs to get a chest xray first before determining if he would benefit from repeat thoracentesis.

## 2018-02-03 NOTE — Telephone Encounter (Signed)
Called Polly back to let her know pt should be able to have the heart cath. Stated again to [facility] that once we have the cxr results, we will determine if pt does need to have a thoracentesis done again but also stated to her if it does need to be done not sure how soon IR could set up for it to be done.  Polly stated to me pt was currently at xray dept. Will call once we have results.

## 2018-02-03 NOTE — Telephone Encounter (Signed)
Called and spoke with patient, he states that he is having increased SOB as well as retaining more fluid. Patient is requesting another thoracentisis. I advised patient that I would send message over to the physician.    VS please advise, thank you.

## 2018-02-03 NOTE — Addendum Note (Signed)
Addended by: Lorretta Harp on: 02/03/2018 03:38 PM   Modules accepted: Orders

## 2018-02-04 ENCOUNTER — Other Ambulatory Visit: Payer: Self-pay | Admitting: Pulmonary Disease

## 2018-02-04 ENCOUNTER — Ambulatory Visit (HOSPITAL_COMMUNITY)
Admission: RE | Admit: 2018-02-04 | Discharge: 2018-02-04 | Disposition: A | Discharge status: Home / Self Care | Payer: Medicare Other | Source: Ambulatory Visit | Attending: Pulmonary Disease | Admitting: Pulmonary Disease

## 2018-02-04 ENCOUNTER — Encounter (HOSPITAL_COMMUNITY): Payer: Self-pay | Admitting: Interventional Radiology

## 2018-02-04 ENCOUNTER — Ambulatory Visit (HOSPITAL_COMMUNITY)
Admission: RE | Admit: 2018-02-04 | Discharge: 2018-02-04 | Disposition: A | Discharge status: Home / Self Care | Payer: Medicare Other | Source: Ambulatory Visit | Attending: Physician Assistant | Admitting: Physician Assistant

## 2018-02-04 DIAGNOSIS — R0602 Shortness of breath: Secondary | ICD-10-CM

## 2018-02-04 DIAGNOSIS — J90 Pleural effusion, not elsewhere classified: Secondary | ICD-10-CM | POA: Diagnosis not present

## 2018-02-04 HISTORY — PX: IR THORACENTESIS ASP PLEURAL SPACE W/IMG GUIDE: IMG5380

## 2018-02-04 MED ORDER — LIDOCAINE HCL (PF) 2 % IJ SOLN
INTRAMUSCULAR | Status: AC
  Filled 2018-02-04: qty 20

## 2018-02-04 MED ORDER — LIDOCAINE HCL 2 % IJ SOLN
INTRAMUSCULAR | Status: DC | PRN
  Administered 2018-02-04: 10 mL

## 2018-02-04 NOTE — Procedures (Signed)
PROCEDURE SUMMARY:  Successful image-guided left thoracentesis. Yielded 1.3 liters of yellow fluid. Patient tolerated procedure well. No immediate complications.  Specimen was not sent for labs. CXR ordered.  Joaquim Nam PA-C 02/04/2018 2:59 PM

## 2018-02-05 ENCOUNTER — Ambulatory Visit: Payer: Medicare Other | Admitting: Pulmonary Disease

## 2018-02-05 ENCOUNTER — Encounter: Payer: Self-pay | Admitting: Emergency Medicine

## 2018-02-05 ENCOUNTER — Encounter (INDEPENDENT_AMBULATORY_CARE_PROVIDER_SITE_OTHER): Payer: Self-pay

## 2018-02-06 ENCOUNTER — Encounter (HOSPITAL_COMMUNITY): Payer: Self-pay | Admitting: Physician Assistant

## 2018-02-06 ENCOUNTER — Other Ambulatory Visit (HOSPITAL_COMMUNITY): Payer: Self-pay | Admitting: Physician Assistant

## 2018-02-06 ENCOUNTER — Ambulatory Visit (HOSPITAL_COMMUNITY)
Admission: RE | Admit: 2018-02-06 | Discharge: 2018-02-06 | Disposition: A | Discharge status: Home / Self Care | Payer: Medicare Other | Source: Ambulatory Visit | Attending: Physician Assistant | Admitting: Physician Assistant

## 2018-02-06 ENCOUNTER — Ambulatory Visit (HOSPITAL_COMMUNITY)
Admission: RE | Admit: 2018-02-06 | Discharge: 2018-02-06 | Disposition: A | Discharge status: Home / Self Care | Payer: Medicare Other | Source: Ambulatory Visit | Attending: Pulmonary Disease | Admitting: Pulmonary Disease

## 2018-02-06 ENCOUNTER — Encounter: Payer: Medicare Other | Admitting: Cardiothoracic Surgery

## 2018-02-06 ENCOUNTER — Telehealth: Payer: Self-pay | Admitting: Cardiology

## 2018-02-06 DIAGNOSIS — Z9889 Other specified postprocedural states: Secondary | ICD-10-CM | POA: Insufficient documentation

## 2018-02-06 DIAGNOSIS — R0602 Shortness of breath: Secondary | ICD-10-CM | POA: Diagnosis not present

## 2018-02-06 DIAGNOSIS — J90 Pleural effusion, not elsewhere classified: Secondary | ICD-10-CM | POA: Insufficient documentation

## 2018-02-06 HISTORY — PX: IR THORACENTESIS ASP PLEURAL SPACE W/IMG GUIDE: IMG5380

## 2018-02-06 MED ORDER — LIDOCAINE HCL 1 % IJ SOLN
INTRAMUSCULAR | Status: DC | PRN
  Administered 2018-02-06: 10 mL

## 2018-02-06 MED ORDER — LIDOCAINE HCL (PF) 2 % IJ SOLN
INTRAMUSCULAR | Status: AC
  Filled 2018-02-06: qty 20

## 2018-02-06 NOTE — Procedures (Signed)
PROCEDURE SUMMARY:  Successful image-guided right thoracentesis. Yielded 1.5 liters of clear yellow fluid. Patient tolerated procedure well. No immediate complications.  Specimen was not sent for labs. CXR ordered.  Joaquim Nam PA-C 02/06/2018 3:37 PM

## 2018-02-06 NOTE — Telephone Encounter (Signed)
Patient's wife needs a call regarding canceling his cath procedure.

## 2018-02-06 NOTE — Telephone Encounter (Signed)
F/U     Patient returned called, would like a call back.

## 2018-02-06 NOTE — Telephone Encounter (Signed)
Spoke with patient's wife, Arbie Cookey, per [facility] regarding heart catheterization scheduled for 02/11/18. Arbie Cookey explained that we needed to rescheduled this procedure because the patient is going to have pleurX catheter placement done on Tuesday or Wednesday of next week due to recurrent pleural effusions. Heart catheterization has been rescheduled for 02/19/18 at 8:30 am. Informed Arbie Cookey to have patient at [facility] at 6:30 am that morning. Arbie Cookey verbalized understanding. No further questions.

## 2018-02-07 ENCOUNTER — Telehealth: Payer: Self-pay | Admitting: Pulmonary Disease

## 2018-02-07 ENCOUNTER — Institutional Professional Consult (permissible substitution) (INDEPENDENT_AMBULATORY_CARE_PROVIDER_SITE_OTHER): Payer: Medicare Other | Admitting: Cardiothoracic Surgery

## 2018-02-07 ENCOUNTER — Other Ambulatory Visit: Payer: Self-pay

## 2018-02-07 ENCOUNTER — Ambulatory Visit
Admission: RE | Admit: 2018-02-07 | Discharge: 2018-02-07 | Disposition: A | Discharge status: Home / Self Care | Payer: Medicare Other | Source: Ambulatory Visit | Attending: Cardiothoracic Surgery | Admitting: Cardiothoracic Surgery

## 2018-02-07 ENCOUNTER — Encounter: Payer: Self-pay | Admitting: Cardiothoracic Surgery

## 2018-02-07 ENCOUNTER — Other Ambulatory Visit: Payer: Self-pay | Admitting: Cardiothoracic Surgery

## 2018-02-07 VITALS — BP 110/50 | HR 68 | Resp 18 | Ht 66.0 in | Wt 154.0 lb

## 2018-02-07 DIAGNOSIS — J90 Pleural effusion, not elsewhere classified: Secondary | ICD-10-CM

## 2018-02-07 DIAGNOSIS — I2510 Atherosclerotic heart disease of native coronary artery without angina pectoris: Secondary | ICD-10-CM | POA: Diagnosis not present

## 2018-02-07 DIAGNOSIS — I2584 Coronary atherosclerosis due to calcified coronary lesion: Secondary | ICD-10-CM

## 2018-02-07 NOTE — Progress Notes (Signed)
PCP is Wenda Low, MD Referring Provider is Lauraine Rinne, NP  Chief Complaint  Patient presents with  . Pleural Effusion    new patient consultation  Patient examined, most recent x-rays and CT scan of chest images personally reviewed and counseled with patient. Most recent echocardiogram images personally reviewed and discussed with patient and wife. HPI: 84 year old male with polymyalgia rheumatica and recent onset of severe bilateral pleural effusions, transudate of with negative cytology.  The patient is required bilateral thoracenteses 4 times since May 2019.  Typical drainage is 1.2 to 1.4 L of clear xanthochromic fluid.  Associated with this is some ankle edema.  Echocardiogram showed normal LV systolic function, no significant valvular disease or pericardial effusion.  The patient had a stress test which showed normal EF with some evidence of attenuation, probably because of bilateral pleural effusions.  Has been placed on oral Lasix 80 mg a day with minimal effect.  Patient will be referred to the advanced heart failure clinic for evaluation of possible cardiac amyloidosis.  He presents today to discuss bilateral Pleurx catheter placement which we will plan at [facility] on July 25 as outpatient.  Prior to surgery he will stop Plavix which she has been taking for the past few years for previous stroke.  Patient is already been scheduled for left and right heart catheterization would probably be helpful to evaluate for pulmonary hypertension and right heart failure.  Patient underwent right thoracentesis yesterday currently with minimal shortness of breath.  Oxygen saturation on room air is normal.  He is not on home oxygen.  He has been trying to increase his protein intake to compensate for the protein loss with thoracentesis  Past Medical History:  Diagnosis Date  . Actinic keratoses   . Arthritis   . Colon polyps   . Dyspnea 07/13/2013   BNP normal  . Edema 07/13/2013  .  GERD (gastroesophageal reflux disease)    omeprazole 1-2 times daily  . H/O TIA (transient ischemic attack) and stroke    on statins and plavix -saw Dr.Sethi in the past  . History of BPH   . History of TIA (transient ischemic attack) 07/13/2013  . Lumbar radiculopathy   . Need for shingles vaccine   . Optic neuritis 10/2015   left eye  . Perennial allergic rhinitis   . Polymyalgia rheumatica ([place]) 07/13/2013   Daily prednisone  . Restless leg syndrome   . Retinal disease    right eye since birth   . Shingles   . Temporal arteritis ([place]) 10/2015    Past Surgical History:  Procedure Laterality Date  . BRAVO [place] STUDY N/A 04/18/2016   Procedure: BRAVO [place];  Surgeon: Wilford Corner, MD;  Location: [facility];  Service: Endoscopy;  Laterality: N/A;  . COLONOSCOPY    . drropy eyelid surgery    . ESOPHAGOGASTRODUODENOSCOPY (EGD) WITH PROPOFOL N/A 04/18/2016   Procedure: ESOPHAGOGASTRODUODENOSCOPY (EGD) WITH PROPOFOL;  Surgeon: Wilford Corner, MD;  [facility];  Service: Endoscopy;  Laterality: N/A;  . IR THORACENTESIS ASP PLEURAL SPACE W/IMG GUIDE  02/04/2018  . IR THORACENTESIS ASP PLEURAL SPACE W/IMG GUIDE  02/06/2018  . VASECTOMY      Family History  Problem Relation Age of Onset  . Stroke Mother   . Coronary artery disease Mother   . Diabetes Mother   . Breast cancer Mother   . Heart attack Father   . Coronary artery disease Father   . Diabetes Father   . Graves'  disease Sister     Social History Social History   Tobacco Use  . Smoking status: Former Smoker    Packs/day: 1.00    Years: 15.00    Pack years: 15.00    Types: Cigarettes    Last attempt to quit: 07/23/1975    Years since quitting: 42.5  . Smokeless tobacco: Never Used  Substance Use Topics  . Alcohol use: Yes    Alcohol/week: 4.2 oz    Types: 7 Glasses of wine per week    Comment: 1 glass red wine daily  . Drug use: No    Current Outpatient Medications  Medication Sig Dispense  Refill  . clopidogrel (PLAVIX) 75 MG tablet Take 75 mg by mouth daily with breakfast.    . doxazosin (CARDURA) 8 MG tablet Take 8 mg by mouth daily.    . furosemide (LASIX) 20 MG tablet Take 1 tablet (20 mg total) by mouth daily. 60 tablet 11  . omeprazole (PRILOSEC) 20 MG capsule Take 1 capsule (20 mg total) by mouth daily.    . simvastatin (ZOCOR) 20 MG tablet Take 20 mg by mouth daily.     No current facility-administered medications for this visit.     No Known Allergies  Review of Systems Dry cough and shortness of breath associated with effusions Positive for weight loss with multiple thoracenteses Frequent urination Bilateral ankle swelling Bruising on Plavix History of mini stroke with some visual loss left visual field Positive orthopnea Positive for fatigue decreased energy History of yellow nail changes seen by dermatology   BP (!) 110/50 (BP Location: Left Arm, Patient Position: Sitting, Cuff Size: Normal)   Pulse 68   Resp 18   Ht 5\' 6"  (1.676 m)   Wt 154 lb (69.9 kg)   SpO2 97% Comment: RA  BMI 24.86 kg/m  Physical Exam      Exam    General- alert and comfortable    Neck- no JVD, no cervical adenopathy palpable, no carotid bruit   Lungs- clear without rales, wheezes   Cor- regular rate and rhythm, no murmur , gallop   Abdomen- soft, non-tender   Extremities - warm, non-tender, minimal edema   Neuro- oriented, appropriate, no focal weakness   Diagnostic Tests: Most recent chest x-ray with minimal bilateral pleural effusions after recent thoracentesis on each side  Impression: Recurrent pleural effusion and ankle edema with multiple bilateral thoracentesis procedures.  Etiology unclear but probably related to diastolic dysfunction, right heart failure, possible cardiac amyloid, possible pulmonary hypertension.  Plan: Bilateral Pleurx catheter placement scheduled at [facility] July 25 2 help manage the fluid until the underlying cause can be diagnosed  and treated.  Patient will be referred to the advanced heart failure clinic to be assessed for possible cardiac amyloidosis.  Patient understands the details of the Pleurx catheter implantation procedure occluding the risks and agrees to proceed.   Len Childs, MD [facility] [phone]

## 2018-02-07 NOTE — Telephone Encounter (Signed)
Called and spoke with patient, he wanted to make sure that it was still necessary for him to come to his appointment on Monday since he just had a thoracentisis done. Advised patient that this is a two week follow up from last visit and since he recently had another procedure it was probably best he kept his appointment. Will route to [place] as an [place].

## 2018-02-09 NOTE — Telephone Encounter (Signed)
Yes this was a 2-week follow-up for the patient from a thoracentesis that we scheduled her last office visit.  Since then patient has had another outpatient thoracentesis.  Also has completed cardiac follow-ups as well as cardiothoracic surgery follow-ups.  I am okay with the patient canceling 7/22 appointment with me and proceeding forward with cardiothoracic follow-up in 1 week as it looks like he was discussed with Dr. Darcey Nora and the patient.  If they have any questions or concerns are obviously always more than welcome to come in.  I think in lieu of the patient's recent multiple follow-ups its okay for him to cancel this upcoming appointment.  Confirm with patient they are following up with cardiothoracic surgery for bilateral pleurix catheter placement for his recurrent effusions.   [name], FNP

## 2018-02-10 ENCOUNTER — Telehealth: Payer: Self-pay | Admitting: Pulmonary Disease

## 2018-02-10 ENCOUNTER — Ambulatory Visit: Payer: Medicare Other | Admitting: Pulmonary Disease

## 2018-02-10 ENCOUNTER — Other Ambulatory Visit: Payer: Self-pay | Admitting: *Deleted

## 2018-02-10 DIAGNOSIS — J90 Pleural effusion, not elsewhere classified: Secondary | ICD-10-CM

## 2018-02-10 NOTE — Telephone Encounter (Signed)
lmtcb for pt.  

## 2018-02-10 NOTE — Telephone Encounter (Signed)
Called and spoke with patient per NP B.Mack, patient did not need to be seen today. Patient informed this Probation officer he would be having his pleurx drains placed this Thursday. Informed NP B. Warner Mccreedy, patient can keep appt in August with MD V. Sood. Informed patient if he needed to be seen before then to call us.

## 2018-02-10 NOTE — Telephone Encounter (Signed)
Called Stuart Watson.  Stuart Watson, Stuart Watson had already called and spoke with Stuart Watson per [name], NP request.  Nothing further at this time.

## 2018-02-10 NOTE — Telephone Encounter (Signed)
Pt is returning call. Per Pt, he will be having the Pleurix Drain placed this week. Cb is [phone]

## 2018-02-12 ENCOUNTER — Other Ambulatory Visit: Payer: Self-pay

## 2018-02-12 ENCOUNTER — Encounter (HOSPITAL_COMMUNITY): Payer: Self-pay | Admitting: *Deleted

## 2018-02-12 NOTE — Progress Notes (Signed)
Pt denies any acute cardiopulmonary issues. Pt under the care of Dr. Geraldo Pitter, Cardiology. Pt denies having a cath. Pt denies having labs in the last 6 days. Pt made aware to stop taking vitamins, fish oil and herbal medications. Do not take any NSAIDs ie: Ibuprofen, Advil, Naproxen (Aleve), Motrin, BC and Goody Powder. Pt stated that last dose of Plavix was " Friday morning when the doctor said stop." Pt verbalized understanding of all pre-op instructions. Anesthesia asked to review pt history.

## 2018-02-13 ENCOUNTER — Ambulatory Visit (HOSPITAL_COMMUNITY)
Admission: RE | Admit: 2018-02-13 | Discharge: 2018-02-13 | Disposition: A | Discharge status: Home / Self Care | Payer: Medicare Other | Source: Ambulatory Visit | Attending: Cardiothoracic Surgery | Admitting: Cardiothoracic Surgery

## 2018-02-13 ENCOUNTER — Encounter (HOSPITAL_COMMUNITY): Payer: Self-pay

## 2018-02-13 ENCOUNTER — Ambulatory Visit (HOSPITAL_COMMUNITY): Payer: Medicare Other | Admitting: Physician Assistant

## 2018-02-13 ENCOUNTER — Ambulatory Visit (HOSPITAL_COMMUNITY): Payer: Medicare Other

## 2018-02-13 ENCOUNTER — Encounter (HOSPITAL_COMMUNITY)
Admission: RE | Disposition: A | Discharge status: Home / Self Care | Payer: Self-pay | Source: Ambulatory Visit | Attending: Cardiothoracic Surgery

## 2018-02-13 DIAGNOSIS — Z419 Encounter for procedure for purposes other than remedying health state, unspecified: Secondary | ICD-10-CM

## 2018-02-13 DIAGNOSIS — I5030 Unspecified diastolic (congestive) heart failure: Secondary | ICD-10-CM | POA: Diagnosis not present

## 2018-02-13 DIAGNOSIS — J329 Chronic sinusitis, unspecified: Secondary | ICD-10-CM | POA: Diagnosis not present

## 2018-02-13 DIAGNOSIS — I2510 Atherosclerotic heart disease of native coronary artery without angina pectoris: Secondary | ICD-10-CM | POA: Diagnosis not present

## 2018-02-13 DIAGNOSIS — I739 Peripheral vascular disease, unspecified: Secondary | ICD-10-CM | POA: Diagnosis not present

## 2018-02-13 DIAGNOSIS — Z8673 Personal history of transient ischemic attack (TIA), and cerebral infarction without residual deficits: Secondary | ICD-10-CM | POA: Diagnosis not present

## 2018-02-13 DIAGNOSIS — Z87891 Personal history of nicotine dependence: Secondary | ICD-10-CM | POA: Diagnosis not present

## 2018-02-13 DIAGNOSIS — Z79899 Other long term (current) drug therapy: Secondary | ICD-10-CM | POA: Insufficient documentation

## 2018-02-13 DIAGNOSIS — G2581 Restless legs syndrome: Secondary | ICD-10-CM | POA: Diagnosis not present

## 2018-02-13 DIAGNOSIS — J90 Pleural effusion, not elsewhere classified: Secondary | ICD-10-CM

## 2018-02-13 DIAGNOSIS — Z452 Encounter for adjustment and management of vascular access device: Secondary | ICD-10-CM | POA: Diagnosis not present

## 2018-02-13 DIAGNOSIS — Z9889 Other specified postprocedural states: Secondary | ICD-10-CM

## 2018-02-13 DIAGNOSIS — J918 Pleural effusion in other conditions classified elsewhere: Secondary | ICD-10-CM | POA: Diagnosis not present

## 2018-02-13 DIAGNOSIS — M353 Polymyalgia rheumatica: Secondary | ICD-10-CM | POA: Diagnosis not present

## 2018-02-13 DIAGNOSIS — J9811 Atelectasis: Secondary | ICD-10-CM | POA: Diagnosis not present

## 2018-02-13 HISTORY — DX: Presence of spectacles and contact lenses: Z97.3

## 2018-02-13 HISTORY — PX: CHEST TUBE INSERTION: SHX231

## 2018-02-13 HISTORY — DX: Pleural effusion, not elsewhere classified: J90

## 2018-02-13 HISTORY — DX: Headache: R51

## 2018-02-13 HISTORY — DX: Headache, unspecified: R51.9

## 2018-02-13 LAB — COMPREHENSIVE METABOLIC PANEL
ALT: 9 U/L (ref 0–44)
AST: 33 U/L (ref 15–41)
Albumin: 2.7 g/dL — ABNORMAL LOW (ref 3.5–5.0)
Alkaline Phosphatase: 35 U/L — ABNORMAL LOW (ref 38–126)
Anion gap: 9 (ref 5–15)
BUN: 17 mg/dL (ref 8–23)
CO2: 21 mmol/L — ABNORMAL LOW (ref 22–32)
Calcium: 8.1 mg/dL — ABNORMAL LOW (ref 8.9–10.3)
Chloride: 106 mmol/L (ref 98–111)
Creatinine, Ser: 0.87 mg/dL (ref 0.61–1.24)
GFR calc Af Amer: >60 mL/min (ref >60)
GFR calc non Af Amer: >60 mL/min (ref >60)
Glucose, Bld: 112 mg/dL — ABNORMAL HIGH (ref 70–99)
Potassium: 4.7 mmol/L (ref 3.5–5.1)
Sodium: 136 mmol/L (ref 135–145)
Total Bilirubin: 1 mg/dL (ref 0.3–1.2)
Total Protein: 5.4 g/dL — ABNORMAL LOW (ref 6.5–8.1)

## 2018-02-13 LAB — CBC
HCT: 38 % — ABNORMAL LOW (ref 39.0–52.0)
Hemoglobin: 12.7 g/dL — ABNORMAL LOW (ref 13.0–17.0)
MCH: 32.3 pg (ref 26.0–34.0)
MCHC: 33.4 g/dL (ref 30.0–36.0)
MCV: 96.7 fL (ref 78.0–100.0)
PLATELETS: 276 10*3/uL (ref 150–400)
RBC: 3.93 MIL/uL — ABNORMAL LOW (ref 4.22–5.81)
RDW: 13.6 % (ref 11.5–15.5)
WBC: 4.9 10*3/uL (ref 4.0–10.5)

## 2018-02-13 LAB — SURGICAL PCR SCREEN
MRSA, PCR: NEGATIVE
Staphylococcus aureus: POSITIVE — AB

## 2018-02-13 LAB — PROTIME-INR
INR: 1.18
Prothrombin Time: 14.9 seconds (ref 11.4–15.2)

## 2018-02-13 SURGERY — INSERTION, PLEURAL DRAINAGE CATHETER
Anesthesia: Monitor Anesthesia Care | Site: Chest | Laterality: Right

## 2018-02-13 MED ORDER — PROPOFOL 500 MG/50ML IV EMUL
INTRAVENOUS | Status: DC | PRN
  Administered 2018-02-13: 50 ug/kg/min via INTRAVENOUS

## 2018-02-13 MED ORDER — FENTANYL CITRATE (PF) 100 MCG/2ML IJ SOLN: 25.0000 ug | INTRAMUSCULAR | Status: DC | PRN

## 2018-02-13 MED ORDER — CEFAZOLIN SODIUM-DEXTROSE 2-4 GM/100ML-% IV SOLN
2.0000 g | INTRAVENOUS | Status: AC
  Administered 2018-02-13: 2 g via INTRAVENOUS
  Filled 2018-02-13: qty 100

## 2018-02-13 MED ORDER — FENTANYL CITRATE (PF) 100 MCG/2ML IJ SOLN
INTRAMUSCULAR | Status: DC | PRN
  Administered 2018-02-13: 50 ug via INTRAVENOUS

## 2018-02-13 MED ORDER — LIDOCAINE 2% (20 MG/ML) 5 ML SYRINGE
INTRAMUSCULAR | Status: AC
  Filled 2018-02-13: qty 5

## 2018-02-13 MED ORDER — PROPOFOL 10 MG/ML IV BOLUS
INTRAVENOUS | Status: AC
  Filled 2018-02-13: qty 20

## 2018-02-13 MED ORDER — PHENYLEPHRINE HCL 10 MG/ML IJ SOLN
INTRAMUSCULAR | Status: DC | PRN
  Administered 2018-02-13 (×2): 80 ug via INTRAVENOUS

## 2018-02-13 MED ORDER — LIDOCAINE HCL 1 % IJ SOLN
INTRAMUSCULAR | Status: DC | PRN
  Administered 2018-02-13: 10 mL

## 2018-02-13 MED ORDER — OXYCODONE HCL 5 MG PO TABS
5.0000 mg | ORAL_TABLET | Freq: Once | ORAL | Status: AC | PRN
  Administered 2018-02-13: 5 mg via ORAL

## 2018-02-13 MED ORDER — OXYCODONE HCL 5 MG/5ML PO SOLN: 5.0000 mg | Freq: Once | ORAL | Status: AC | PRN

## 2018-02-13 MED ORDER — SODIUM CHLORIDE 0.9 % IV SOLN
INTRAVENOUS | Status: DC | PRN
  Administered 2018-02-13: 40 ug/min via INTRAVENOUS

## 2018-02-13 MED ORDER — ONDANSETRON HCL 4 MG/2ML IJ SOLN
INTRAMUSCULAR | Status: DC | PRN
  Administered 2018-02-13: 4 mg via INTRAVENOUS

## 2018-02-13 MED ORDER — PHENYLEPHRINE 40 MCG/ML (10ML) SYRINGE FOR IV PUSH (FOR BLOOD PRESSURE SUPPORT)
PREFILLED_SYRINGE | INTRAVENOUS | Status: AC
  Filled 2018-02-13: qty 10

## 2018-02-13 MED ORDER — FENTANYL CITRATE (PF) 250 MCG/5ML IJ SOLN
INTRAMUSCULAR | Status: AC
Start: 2018-02-13 — End: ?
  Filled 2018-02-13: qty 5

## 2018-02-13 MED ORDER — MUPIROCIN 2 % EX OINT
TOPICAL_OINTMENT | CUTANEOUS | Status: AC
  Filled 2018-02-13: qty 22

## 2018-02-13 MED ORDER — LACTATED RINGERS IV SOLN
INTRAVENOUS | Status: DC
  Administered 2018-02-13: 13:00:00 via INTRAVENOUS

## 2018-02-13 MED ORDER — OXYCODONE HCL 5 MG PO TABS
ORAL_TABLET | ORAL | Status: AC
  Filled 2018-02-13: qty 1

## 2018-02-13 MED ORDER — MUPIROCIN 2 % EX OINT: 1.0000 "application " | TOPICAL_OINTMENT | Freq: Once | CUTANEOUS | Status: DC

## 2018-02-13 MED ORDER — ONDANSETRON HCL 4 MG/2ML IJ SOLN: 4.0000 mg | Freq: Once | INTRAMUSCULAR | Status: DC | PRN

## 2018-02-13 MED ORDER — EPHEDRINE SULFATE 50 MG/ML IJ SOLN
INTRAMUSCULAR | Status: DC | PRN
  Administered 2018-02-13: 10 mg via INTRAVENOUS
  Administered 2018-02-13: 5 mg via INTRAVENOUS

## 2018-02-13 SURGICAL SUPPLY — 28 items
BLADE SURG 11 STRL SS (BLADE) ×3 IMPLANT
CANISTER SUCT 3000ML PPV (MISCELLANEOUS) ×3 IMPLANT
COVER SURGICAL LIGHT HANDLE (MISCELLANEOUS) ×3 IMPLANT
DERMABOND ADVANCED (GAUZE/BANDAGES/DRESSINGS) ×2
DERMABOND ADVANCED .7 DNX12 (GAUZE/BANDAGES/DRESSINGS) ×1 IMPLANT
DRAPE C-ARM 42X72 X-RAY (DRAPES) ×3 IMPLANT
DRAPE LAPAROSCOPIC ABDOMINAL (DRAPES) ×3 IMPLANT
GLOVE BIO SURGEON STRL SZ7.5 (GLOVE) ×6 IMPLANT
GOWN STRL REUS W/ TWL LRG LVL3 (GOWN DISPOSABLE) ×3 IMPLANT
GOWN STRL REUS W/TWL LRG LVL3 (GOWN DISPOSABLE) ×6
KIT BASIN OR (CUSTOM PROCEDURE TRAY) ×3 IMPLANT
KIT PLEURX DRAIN CATH 1000ML (MISCELLANEOUS) ×3 IMPLANT
KIT PLEURX DRAIN CATH 15.5FR (DRAIN) ×3 IMPLANT
KIT TURNOVER KIT B (KITS) ×3 IMPLANT
NEEDLE HYPO 25GX1X1/2 BEV (NEEDLE) ×3 IMPLANT
NS IRRIG 1000ML POUR BTL (IV SOLUTION) ×3 IMPLANT
PACK GENERAL/GYN (CUSTOM PROCEDURE TRAY) ×3 IMPLANT
PAD ARMBOARD 7.5X6 YLW CONV (MISCELLANEOUS) ×6 IMPLANT
SET DRAINAGE LINE (MISCELLANEOUS) IMPLANT
SUT ETHILON 3 0 FSL (SUTURE) ×3 IMPLANT
SUT SILK 2 0 SH (SUTURE) ×3 IMPLANT
SUT VIC AB 3-0 SH 8-18 (SUTURE) ×3 IMPLANT
SUT VIC AB 3-0 X1 27 (SUTURE) ×3 IMPLANT
SYR CONTROL 10ML LL (SYRINGE) ×3 IMPLANT
TOWEL GREEN STERILE (TOWEL DISPOSABLE) ×3 IMPLANT
TOWEL GREEN STERILE FF (TOWEL DISPOSABLE) ×3 IMPLANT
VALVE REPLACEMENT CAP (MISCELLANEOUS) IMPLANT
WATER STERILE IRR 1000ML POUR (IV SOLUTION) ×3 IMPLANT

## 2018-02-13 NOTE — Anesthesia Procedure Notes (Signed)
Procedure Name: MAC Date/Time: 02/13/2018 1:30 PM Performed by: Lavell Luster, CRNA Pre-anesthesia Checklist: Patient identified, Emergency Drugs available, Suction available, Patient being monitored and Timeout performed Patient Re-evaluated:Patient Re-evaluated prior to induction Oxygen Delivery Method: Nasal cannula

## 2018-02-13 NOTE — Progress Notes (Signed)
Pre Procedure note for inpatients:   Stuart Watson has been scheduled for Procedure(s): INSERTION PLEURAL DRAINAGE CATHETER (Bilateral) today. The various methods of treatment have been discussed with the patient. After consideration of the risks, benefits and treatment options the patient has consented to the planned procedure.   The patient has been seen and labs reviewed. There are no changes in the patient's condition to prevent proceeding with the planned procedure today.  Recent labs:  Lab Results  Component Value Date   WBC 4.9 02/13/2018   HGB 12.7 (L) 02/13/2018   HCT 38.0 (L) 02/13/2018   PLT 276 02/13/2018   GLUCOSE 112 (H) 02/13/2018   ALT 9 02/13/2018   AST 33 02/13/2018   NA 136 02/13/2018   K 4.7 02/13/2018   CL 106 02/13/2018   CREATININE 0.87 02/13/2018   BUN 17 02/13/2018   CO2 21 (L) 02/13/2018   TSH 5.91 (H) 01/20/2018   INR 1.18 02/13/2018    Len Childs, MD 02/13/2018 12:29 PM

## 2018-02-13 NOTE — Care Management Note (Signed)
Case Management Note  Patient Details  Name: Stuart Watson MRN: [id number] Date of Birth: [birth year]  CM contacted for [facility] services.  Pt chose [facility] who was unable to accept pt due to staffing.  CM contacted [facility] who cannot accept pt's with pleurex catheters.  CM contacted Georgina Snell with [facility] who accepted pt for services.  [facility] contact information placed on AVS.  Expected Discharge Date:   02/13/2018               Expected Discharge Plan:  [place]  Discharge planning Services  CM Consult  Post Acute Care Choice:  Home Health Choice offered to:  Patient  HH Arranged:  RN [facility] Agency:  [place]  Status of Service:  Completed, signed off  Orla Jolliff, Benjaman Lobe, RN 02/13/2018, 3:13 PM

## 2018-02-13 NOTE — Anesthesia Postprocedure Evaluation (Signed)
Anesthesia Post Note  Patient: Stuart Watson  Procedure(s) Performed: INSERTION PLEURAL DRAINAGE CATHETER (Right Chest)     Patient location during evaluation: PACU Anesthesia Type: MAC Level of consciousness: awake and alert Pain management: pain level controlled Vital Signs Assessment: post-procedure vital signs reviewed and stable Respiratory status: spontaneous breathing, nonlabored ventilation and respiratory function stable Cardiovascular status: stable and blood pressure returned to baseline Anesthetic complications: no    Last Vitals:  Vitals:   02/13/18 1500 02/13/18 1515  BP: 116/63 122/60  Pulse: (!) 59 64  Resp: 15 14  Temp:    SpO2: 97% 96%    Last Pain:  Vitals:   02/13/18 1515  TempSrc:   PainSc: 0-No pain                 Audry Pili

## 2018-02-13 NOTE — Op Note (Signed)
NAME: ELMAR, ANTIGUA MEDICAL RECORD [id number] ACCOUNT [id number] DATE OF BIRTH:12-13-1931 FACILITY: [facility] LOCATION: [facility] PHYSICIAN:Alyan Hartline VAN TRIGT III, MD  OPERATIVE REPORT  DATE OF PROCEDURE:  02/13/2018  OPERATION:  Placement of bilateral PleurX catheters for recurrent pleural effusion.  SURGEON:  Ivin Poot, MD  ANESTHESIA:  Monitored conscious sedation with local 1% lidocaine.  PREOPERATIVE DIAGNOSIS:  Diastolic heart failure with recurrent bilateral pleural effusions requiring multiple thoracentesis procedures.  POSTOPERATIVE DIAGNOSIS:  Diastolic heart failure with recurrent bilateral pleural effusions requiring multiple thoracentesis procedures.  DESCRIPTION OF PROCEDURE:  After the patient had been previously examined and the procedure listed above discussed in detail, I reviewed the patient's status in the preop holding area, marked the sites of surgery, documented informed consent and  addressed any final issues with the patient and wife vocalized.  They understood we are placing these catheters to help manage his recurrent pleural effusions for symptomatic relief and that the risks included bleeding, recurrent effusions and infection.  The patient was brought to the operating room and placed supine on the operating table.  He was started on IV conscious sedation.  Both chests were prepped and draped as a sterile field.  A proper time-out was performed.  First, the right PleurX catheter  was placed.  Two areas were numbed with 1% lidocaine.  The right pleural space was entered and a guidewire passed using Seldinger technique and confirmed by C-arm fluoroscopy.  The catheter was then tunneled and then placed through a dilator sheath  system into the posterior aspect of the right pleural space.  It drained 1 L of fluid.  The incisions were closed in a standard fashion.  Next, attention was directed to the left pleural space.  Two areas were numbed with local  anesthesia 1% lidocaine and 2 incisions made.  In the upper incision a guidewire was passed into the posterior left pleural space using the Seldinger technique and  confirmed by C-arm fluoroscopy.  Over the guidewire, the dilator and dilator sheath system was inserted and the catheter was tunneled from the lower incision to the upper incision was then placed through the tear-away sheath into the pleural space.  It  drained 1 L of fluid.  The 2 incisions were closed in a standard fashion.  Sterile dressings were then placed to both catheter sites and the patient returned to the recovery room where a chest x-ray showed good drainage of the pleural effusions and good position of the PleurX catheters.  TN/NUANCE  D:02/13/2018 T:02/13/2018 JOB:[id number]

## 2018-02-13 NOTE — Brief Op Note (Signed)
02/13/2018  2:55 PM  PATIENT:  Stuart Watson  84 y.o. male  PRE-OPERATIVE DIAGNOSIS:  Bilateral Pleural Effusions  POST-OPERATIVE DIAGNOSIS:  Bilateral Pleural Effusions  PROCEDURE:  Procedure(s): INSERTION PLEURAL DRAINAGE CATHETER (Right)   Drainage of bilateral 1 L pleural effusions  SURGEON:  Surgeon(s) and Role:    Ivin Poot, MD - Primary  PHYSICIAN ASSISTANT:   ASSISTANTS: none   ANESTHESIA:   local and MAC  EBL: 10 cc   BLOOD ADMINISTERED:none  DRAINS: none   LOCAL MEDICATIONS USED:  LIDOCAINE  and Amount: 12 ml  SPECIMEN:  No Specimen  DISPOSITION OF SPECIMEN:  N/A  COUNTS:  YES  TOURNIQUET:  * No tourniquets in log *  DICTATION: .Dragon Dictation  PLAN OF CARE: Discharge to home after PACU  PATIENT DISPOSITION:  PACU - hemodynamically stable.   Delay start of Pharmacological VTE agent (>24hrs) due to surgical blood loss or risk of bleeding: yes

## 2018-02-13 NOTE — Transfer of Care (Signed)
Immediate Anesthesia Transfer of Care Note  Patient: Stuart Watson  Procedure(s) Performed: INSERTION PLEURAL DRAINAGE CATHETER (Right Chest)  Patient Location: PACU  Anesthesia Type:MAC  Level of Consciousness: awake, alert  and oriented  Airway & Oxygen Therapy: Patient connected to nasal cannula oxygen  Post-op Assessment: Post -op Vital signs reviewed and stable  Post vital signs: stable  Last Vitals:  Vitals Value Taken Time  BP    Temp    Pulse    Resp    SpO2      Last Pain:  Vitals:   02/13/18 1051  TempSrc: Oral  PainSc: 0-No pain      Patients Stated Pain Goal: 0 (81/02/54 8628)  Complications: No apparent anesthesia complications

## 2018-02-13 NOTE — H&P (Signed)
Effusion       new patient consultation  Patient examined, most recent x-rays and CT scan of chest images personally reviewed and counseled with patient. Most recent echocardiogram images personally reviewed and discussed with patient and wife. HPI: 84 year old male with polymyalgia rheumatica and recent onset of severe bilateral pleural effusions, transudate of with negative cytology.  The patient is required bilateral thoracenteses 4 times since May 2019.  Typical drainage is 1.2 to 1.4 L of clear xanthochromic fluid.  Associated with this is some ankle edema.  Echocardiogram showed normal LV systolic function, no significant valvular disease or pericardial effusion.  The patient had a stress test which showed normal EF with some evidence of attenuation, probably because of bilateral pleural effusions.  Has been placed on oral Lasix 80 mg a day with minimal effect.   Patient will be referred to the advanced heart failure clinic for evaluation of possible cardiac amyloidosis.  He presents today to discuss bilateral Pleurx catheter placement which we will plan at [facility] on July 25 as outpatient.  Prior to surgery he will stop Plavix which she has been taking for the past few years for previous stroke.  Patient is already been scheduled for left and right heart catheterization would probably be helpful to evaluate for pulmonary hypertension and right heart failure.   Patient underwent right thoracentesis yesterday currently with minimal shortness of breath.  Oxygen saturation on room air is normal.  He is not on home oxygen.  He has been trying to increase his protein intake to compensate for the protein loss with thoracentesis       Past Medical History:  Diagnosis Date  . Actinic keratoses    . Arthritis    . Colon polyps    . Dyspnea 07/13/2013    BNP normal  . Edema 07/13/2013  . GERD (gastroesophageal reflux disease)      omeprazole 1-2 times daily  . H/O TIA (transient ischemic  attack) and stroke      on statins and plavix -saw Dr.Sethi in the past  . History of BPH    . History of TIA (transient ischemic attack) 07/13/2013  . Lumbar radiculopathy    . Need for shingles vaccine    . Optic neuritis 10/2015    left eye  . Perennial allergic rhinitis    . Polymyalgia rheumatica ([place]) 07/13/2013    Daily prednisone  . Restless leg syndrome    . Retinal disease      right eye since birth   . Shingles    . Temporal arteritis ([place]) 10/2015           Past Surgical History:  Procedure Laterality Date  . BRAVO [place] STUDY N/A 04/18/2016    Procedure: BRAVO [place];  Surgeon: Wilford Corner, MD;  [facility];  Service: Endoscopy;  Laterality: N/A;  . COLONOSCOPY      . drropy eyelid surgery      . ESOPHAGOGASTRODUODENOSCOPY (EGD) WITH PROPOFOL N/A 04/18/2016    Procedure: ESOPHAGOGASTRODUODENOSCOPY (EGD) WITH PROPOFOL;  Surgeon: Wilford Corner, MD;  [facility];  Service: Endoscopy;  Laterality: N/A;  . IR THORACENTESIS ASP PLEURAL SPACE W/IMG GUIDE   02/04/2018  . IR THORACENTESIS ASP PLEURAL SPACE W/IMG GUIDE   02/06/2018  . VASECTOMY               Family History  Problem Relation Age of Onset  . Stroke Mother    . Coronary artery disease Mother    .  Diabetes Mother    . Breast cancer Mother    . Heart attack Father    . Coronary artery disease Father    . Diabetes Father    . Graves' disease Sister        Social History Social History         Tobacco Use  . Smoking status: Former Smoker      Packs/day: 1.00      Years: 15.00      Pack years: 15.00      Types: Cigarettes      Last attempt to quit: 07/23/1975      Years since quitting: 42.5  . Smokeless tobacco: Never Used  Substance Use Topics  . Alcohol use: Yes      Alcohol/week: 4.2 oz      Types: 7 Glasses of wine per week      Comment: 1 glass red wine daily  . Drug use: No            Current Outpatient Medications  Medication Sig Dispense Refill  .  clopidogrel (PLAVIX) 75 MG tablet Take 75 mg by mouth daily with breakfast.      . doxazosin (CARDURA) 8 MG tablet Take 8 mg by mouth daily.      . furosemide (LASIX) 20 MG tablet Take 1 tablet (20 mg total) by mouth daily. 60 tablet 11  . omeprazole (PRILOSEC) 20 MG capsule Take 1 capsule (20 mg total) by mouth daily.      . simvastatin (ZOCOR) 20 MG tablet Take 20 mg by mouth daily.        No current facility-administered medications for this visit.       No Known Allergies   Review of Systems Dry cough and shortness of breath associated with effusions Positive for weight loss with multiple thoracenteses Frequent urination Bilateral ankle swelling Bruising on Plavix History of mini stroke with some visual loss left visual field Positive orthopnea Positive for fatigue decreased energy History of yellow nail changes seen by dermatology     BP (!) 110/50 (BP Location: Left Arm, Patient Position: Sitting, Cuff Size: Normal)   Pulse 68   Resp 18   Ht 5\' 6"  (1.676 m)   Wt 154 lb (69.9 kg)   SpO2 97% Comment: RA  BMI 24.86 kg/m  Physical Exam      Exam     General- alert and comfortable    Neck- no JVD, no cervical adenopathy palpable, no carotid bruit   Lungs- clear without rales, wheezes   Cor- regular rate and rhythm, no murmur , gallop   Abdomen- soft, non-tender   Extremities - warm, non-tender, minimal edema   Neuro- oriented, appropriate, no focal weakness     Diagnostic Tests: Most recent chest x-ray with minimal bilateral pleural effusions after recent thoracentesis on each side   Impression: Recurrent pleural effusion and ankle edema with multiple bilateral thoracentesis procedures.  Etiology unclear but probably related to diastolic dysfunction, right heart failure, possible cardiac amyloid, possible pulmonary hypertension.   Plan: Bilateral Pleurx catheter placement scheduled at [facility] July 25 2 help manage the fluid until the underlying cause can be  diagnosed and treated.  Patient will be referred to the advanced heart failure clinic to be assessed for possible cardiac amyloidosis.  Patient understands the details of the Pleurx catheter implantation procedure occluding the risks and agrees to proceed.     Len Childs, MD [facility] [phone]  Electronically signed by Ivin Poot, MD at 02/07/2018  5:38 PM    Surgical Consult on 02/07/2018       Detailed Report       Note shared with patient

## 2018-02-13 NOTE — Discharge Instructions (Signed)
No driving for 24 hrs no shower for 24 hrs Office will call for followup appt Home nurse will be arranged to drain catheters

## 2018-02-13 NOTE — Anesthesia Preprocedure Evaluation (Addendum)
Anesthesia Evaluation  Patient identified by MRN, date of birth, ID band Patient awake    Reviewed: Allergy & Precautions, NPO status , Patient's Chart, lab work & pertinent test results  History of Anesthesia Complications Negative for: history of anesthetic complications  Airway Mallampati: I  TM Distance: >3 FB Neck ROM: Full    Dental  (+) Teeth Intact   Pulmonary pneumonia, former smoker,   B/l pleural effusions    breath sounds clear to auscultation + decreased breath sounds      Cardiovascular + CAD and + Peripheral Vascular Disease   Rhythm:Regular Rate:Normal   Temporal arteritis  '19 Stress Test - Nuclear stress EF: 65%. There is a large defect of severe severity present in the basal inferior, mid inferior, apical septal, apical inferior, apical lateral and apex location. The defect is mainly fixed but there is a small area of reversibility in the inferolateral wall. In the setting of normal LVF, this is most likely diaphragmatic attenuation artifact but cannot rule out ischemia in the inferolateral wall. Blood pressure demonstrated a normal response to exercise. There was no ST segment deviation noted during stress. Perfusion changes are new compared to study in 2014. This is an intermediate risk study.  '19 TTE - EF 60% to 65%. Grade 1 diastolic dysfunction. Mild MR. PASP mildly increased at 35 mmHg. A trivial pericardial effusion was identified, along with a left pleural effusion    Neuro/Psych  Headaches,  Restless Leg Syndrome Lumbar Radiculopathy  TIA Neuromuscular disease negative psych ROS   GI/Hepatic Neg liver ROS, GERD  Medicated and Controlled,  Endo/Other  negative endocrine ROS  Renal/GU negative Renal ROS  negative genitourinary   Musculoskeletal  (+) Arthritis ,  Polymyalgia rheumatica    Abdominal   Peds  Hematology negative hematology ROS (+)   Anesthesia Other  Findings   Reproductive/Obstetrics                           Anesthesia Physical Anesthesia Plan  ASA: III  Anesthesia Plan: MAC   Post-op Pain Management:    Induction: Intravenous  PONV Risk Score and Plan: 2 and Propofol infusion and Treatment may vary due to age or medical condition  Airway Management Planned: Natural Airway and Simple Face Mask  Additional Equipment: None  Intra-op Plan:   Post-operative Plan:   Informed Consent: I have reviewed the patients History and Physical, chart, labs and discussed the procedure including the risks, benefits and alternatives for the proposed anesthesia with the patient or authorized representative who has indicated his/her understanding and acceptance.     Plan Discussed with: CRNA and Anesthesiologist  Anesthesia Plan Comments:        Anesthesia Quick Evaluation

## 2018-02-14 ENCOUNTER — Encounter: Payer: Self-pay | Admitting: Cardiology

## 2018-02-14 ENCOUNTER — Encounter (HOSPITAL_COMMUNITY): Payer: Self-pay | Admitting: Cardiothoracic Surgery

## 2018-02-14 DIAGNOSIS — I69398 Other sequelae of cerebral infarction: Secondary | ICD-10-CM | POA: Diagnosis not present

## 2018-02-14 DIAGNOSIS — M5116 Intervertebral disc disorders with radiculopathy, lumbar region: Secondary | ICD-10-CM | POA: Diagnosis not present

## 2018-02-14 DIAGNOSIS — I110 Hypertensive heart disease with heart failure: Secondary | ICD-10-CM | POA: Diagnosis not present

## 2018-02-14 DIAGNOSIS — Z48813 Encounter for surgical aftercare following surgery on the respiratory system: Secondary | ICD-10-CM | POA: Diagnosis not present

## 2018-02-14 DIAGNOSIS — M353 Polymyalgia rheumatica: Secondary | ICD-10-CM | POA: Diagnosis not present

## 2018-02-14 DIAGNOSIS — H53452 Other localized visual field defect, left eye: Secondary | ICD-10-CM | POA: Diagnosis not present

## 2018-02-14 DIAGNOSIS — G2581 Restless legs syndrome: Secondary | ICD-10-CM | POA: Diagnosis not present

## 2018-02-14 DIAGNOSIS — I700 Atherosclerosis of aorta: Secondary | ICD-10-CM | POA: Diagnosis not present

## 2018-02-14 DIAGNOSIS — I2510 Atherosclerotic heart disease of native coronary artery without angina pectoris: Secondary | ICD-10-CM | POA: Diagnosis not present

## 2018-02-14 DIAGNOSIS — M316 Other giant cell arteritis: Secondary | ICD-10-CM | POA: Diagnosis not present

## 2018-02-14 DIAGNOSIS — I5030 Unspecified diastolic (congestive) heart failure: Secondary | ICD-10-CM | POA: Diagnosis not present

## 2018-02-14 DIAGNOSIS — J90 Pleural effusion, not elsewhere classified: Secondary | ICD-10-CM | POA: Diagnosis not present

## 2018-02-14 NOTE — Care Management (Addendum)
CM completed PleurX drainage kit order form and faxed to [employer] at [phone]. LM to send  ED CM e-mail once received. [place] services were arranged by [facility] ED CM.

## 2018-02-17 DIAGNOSIS — J90 Pleural effusion, not elsewhere classified: Secondary | ICD-10-CM | POA: Diagnosis not present

## 2018-02-17 DIAGNOSIS — Z48813 Encounter for surgical aftercare following surgery on the respiratory system: Secondary | ICD-10-CM | POA: Diagnosis not present

## 2018-02-19 ENCOUNTER — Encounter (HOSPITAL_COMMUNITY): Admission: RE | Payer: Self-pay | Source: Ambulatory Visit

## 2018-02-19 ENCOUNTER — Ambulatory Visit (HOSPITAL_COMMUNITY): Admission: RE | Admit: 2018-02-19 | Payer: Medicare Other | Source: Ambulatory Visit | Admitting: Cardiology

## 2018-02-19 DIAGNOSIS — Z48813 Encounter for surgical aftercare following surgery on the respiratory system: Secondary | ICD-10-CM | POA: Diagnosis not present

## 2018-02-19 DIAGNOSIS — J90 Pleural effusion, not elsewhere classified: Secondary | ICD-10-CM | POA: Diagnosis not present

## 2018-02-19 SURGERY — LEFT HEART CATH AND CORONARY ANGIOGRAPHY
Anesthesia: LOCAL

## 2018-02-21 DIAGNOSIS — J90 Pleural effusion, not elsewhere classified: Secondary | ICD-10-CM | POA: Diagnosis not present

## 2018-02-21 DIAGNOSIS — Z48813 Encounter for surgical aftercare following surgery on the respiratory system: Secondary | ICD-10-CM | POA: Diagnosis not present

## 2018-02-24 DIAGNOSIS — Z48813 Encounter for surgical aftercare following surgery on the respiratory system: Secondary | ICD-10-CM | POA: Diagnosis not present

## 2018-02-24 DIAGNOSIS — J90 Pleural effusion, not elsewhere classified: Secondary | ICD-10-CM | POA: Diagnosis not present

## 2018-02-25 ENCOUNTER — Other Ambulatory Visit: Payer: Self-pay | Admitting: Cardiothoracic Surgery

## 2018-02-25 DIAGNOSIS — R911 Solitary pulmonary nodule: Secondary | ICD-10-CM

## 2018-02-26 ENCOUNTER — Other Ambulatory Visit: Payer: Self-pay

## 2018-02-26 ENCOUNTER — Ambulatory Visit (INDEPENDENT_AMBULATORY_CARE_PROVIDER_SITE_OTHER): Payer: Medicare Other | Admitting: Cardiothoracic Surgery

## 2018-02-26 ENCOUNTER — Encounter: Payer: Self-pay | Admitting: Cardiothoracic Surgery

## 2018-02-26 ENCOUNTER — Ambulatory Visit
Admission: RE | Admit: 2018-02-26 | Discharge: 2018-02-26 | Disposition: A | Discharge status: Home / Self Care | Payer: Medicare Other | Source: Ambulatory Visit | Attending: Cardiothoracic Surgery | Admitting: Cardiothoracic Surgery

## 2018-02-26 VITALS — BP 118/55 | HR 65 | Resp 18 | Ht 66.0 in | Wt 155.0 lb

## 2018-02-26 DIAGNOSIS — I2584 Coronary atherosclerosis due to calcified coronary lesion: Secondary | ICD-10-CM | POA: Diagnosis not present

## 2018-02-26 DIAGNOSIS — Z9689 Presence of other specified functional implants: Secondary | ICD-10-CM | POA: Diagnosis not present

## 2018-02-26 DIAGNOSIS — I2510 Atherosclerotic heart disease of native coronary artery without angina pectoris: Secondary | ICD-10-CM

## 2018-02-26 DIAGNOSIS — R911 Solitary pulmonary nodule: Secondary | ICD-10-CM

## 2018-02-26 DIAGNOSIS — I509 Heart failure, unspecified: Secondary | ICD-10-CM

## 2018-02-26 DIAGNOSIS — J90 Pleural effusion, not elsewhere classified: Secondary | ICD-10-CM | POA: Diagnosis not present

## 2018-02-26 DIAGNOSIS — Z48813 Encounter for surgical aftercare following surgery on the respiratory system: Secondary | ICD-10-CM | POA: Diagnosis not present

## 2018-02-28 ENCOUNTER — Encounter: Payer: Self-pay | Admitting: Cardiothoracic Surgery

## 2018-02-28 ENCOUNTER — Telehealth (HOSPITAL_COMMUNITY): Payer: Self-pay | Admitting: Internal Medicine

## 2018-02-28 NOTE — Telephone Encounter (Signed)
Patient was scheduled for New CHF appt with Dr. [name] 03/03/18 @3  pm.  Pt called today stating he wanted to cancel this appt.  Patient declined to reschedule.  He stated he would call our office if he decided he wanted to come for an appt.  Staff message sent to Kevan Rosebush, RN to make her aware.

## 2018-02-28 NOTE — Progress Notes (Signed)
PCP is Wenda Low, MD Referring Provider is Lauraine Rinne, NP  Chief Complaint  Patient presents with  . Follow-up    Bilateral PleurX drainage catheter in place, with chest xray    HPI: Patient returns for follow-up care of bilateral Pleurx catheters for recurrent bilateral pleural effusions related to diastolic heart failure.  Drainage has been about 500 cc each side 3 days a week. Symptoms of shortness of breath are much improved with Pleurx drainage.  Patient was scheduled for evaluation at heart failure cardiology clinic for possible underlying amyloidosis however he has declined that appointment.  The skin sutures at the insertion sites on both side were removed today and are healing.  Chest x-ray today shows catheters in good position with minimal effusions.  Past Medical History:  Diagnosis Date  . Actinic keratoses   . Arthritis   . Chronic bilateral pleural effusions   . Colon polyps   . Dyspnea 07/13/2013   BNP normal  . Edema 07/13/2013  . GERD (gastroesophageal reflux disease)    omeprazole 1-2 times daily  . H/O TIA (transient ischemic attack) and stroke    on statins and plavix -saw Dr.Sethi in the past  . Headache   . History of BPH   . History of TIA (transient ischemic attack) 07/13/2013  . Lumbar radiculopathy   . Need for shingles vaccine   . Optic neuritis 10/2015   left eye  . Perennial allergic rhinitis   . Polymyalgia rheumatica ([place]) 07/13/2013   Daily prednisone  . Restless leg syndrome   . Retinal disease    right eye since birth   . Shingles   . Temporal arteritis ([place]) 10/2015  . Wears glasses     Past Surgical History:  Procedure Laterality Date  . BRAVO [place] STUDY N/A 04/18/2016   Procedure: BRAVO [place];  Surgeon: Wilford Corner, MD;  Location: [facility];  Service: Endoscopy;  Laterality: N/A;  . CHEST TUBE INSERTION Right 02/13/2018   Procedure: INSERTION PLEURAL DRAINAGE CATHETER;  Surgeon: Ivin Poot, MD;  Location: [place];   Service: Thoracic;  Laterality: Right;  . COLONOSCOPY    . drropy eyelid surgery    . ESOPHAGOGASTRODUODENOSCOPY (EGD) WITH PROPOFOL N/A 04/18/2016   Procedure: ESOPHAGOGASTRODUODENOSCOPY (EGD) WITH PROPOFOL;  Surgeon: Wilford Corner, MD;  Location: [facility];  Service: Endoscopy;  Laterality: N/A;  . IR THORACENTESIS ASP PLEURAL SPACE W/IMG GUIDE  02/04/2018  . IR THORACENTESIS ASP PLEURAL SPACE W/IMG GUIDE  02/06/2018  . VASECTOMY      Family History  Problem Relation Age of Onset  . Stroke Mother   . Coronary artery disease Mother   . Diabetes Mother   . Breast cancer Mother   . Heart attack Father   . Coronary artery disease Father   . Diabetes Father   . Graves' disease Sister     Social History Social History   Tobacco Use  . Smoking status: Former Smoker    Packs/day: 1.00    Years: 15.00    Pack years: 15.00    Types: Cigarettes    Last attempt to quit: 07/23/1975    Years since quitting: 42.6  . Smokeless tobacco: Never Used  Substance Use Topics  . Alcohol use: Yes    Alcohol/week: 7.0 standard drinks    Types: 7 Glasses of wine per week    Comment: 1 glass red wine daily  . Drug use: No    Current Outpatient Medications  Medication Sig Dispense Refill  .  clopidogrel (PLAVIX) 75 MG tablet Take 75 mg by mouth daily with breakfast.    . doxazosin (CARDURA) 8 MG tablet Take 8 mg by mouth daily.    . furosemide (LASIX) 20 MG tablet Take 1 tablet (20 mg total) by mouth daily. 60 tablet 11  . omeprazole (PRILOSEC) 20 MG capsule Take 1 capsule (20 mg total) by mouth daily.    . simvastatin (ZOCOR) 20 MG tablet Take 20 mg by mouth daily.     No current facility-administered medications for this visit.     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  BP (!) 118/55 (BP Location: Left Arm, Patient Position: Sitting, Cuff Size: Normal)   Pulse 65   Resp 18   Ht 5\' 6"  (1.676 m)   Wt 155 lb (70.3 kg)   SpO2 99% Comment: RA  BMI 25.02 kg/m  Physical Exam      Exam    General- alert and comfortable    Neck- no JVD, no cervical adenopathy palpable, no carotid bruit   Lungs- clear without rales, wheezes   Cor- regular rate and rhythm, no murmur , gallop   Abdomen- soft, non-tender   Extremities - warm, non-tender, minimal edema   Neuro- oriented, appropriate, no focal weakness   Diagnostic Tests: Chest x-ray shows Pleurx catheters in good position with minimal effusion Impression: Continue current drainage scheduled for recurrent idiopathic pleural effusions.  Research into the yellow nail syndrome shows that talc pleurodesis has helped however I would not recommend that procedure for this 84 year old gentleman which could make the effusions more loculated and more difficult to manage than effective drainage with Pleurx catheter.  Plan: Return in 4 weeks chest x-ray   Len Childs, MD [facility] [phone]

## 2018-03-03 ENCOUNTER — Encounter (HOSPITAL_COMMUNITY): Payer: Medicare Other | Admitting: Internal Medicine

## 2018-03-03 DIAGNOSIS — J90 Pleural effusion, not elsewhere classified: Secondary | ICD-10-CM | POA: Diagnosis not present

## 2018-03-03 DIAGNOSIS — Z48813 Encounter for surgical aftercare following surgery on the respiratory system: Secondary | ICD-10-CM | POA: Diagnosis not present

## 2018-03-04 ENCOUNTER — Encounter: Payer: Self-pay | Admitting: Pulmonary Disease

## 2018-03-04 ENCOUNTER — Ambulatory Visit (INDEPENDENT_AMBULATORY_CARE_PROVIDER_SITE_OTHER): Payer: Medicare Other | Admitting: Pulmonary Disease

## 2018-03-04 VITALS — BP 128/78 | HR 70 | Ht 66.5 in | Wt 156.2 lb

## 2018-03-04 DIAGNOSIS — I2584 Coronary atherosclerosis due to calcified coronary lesion: Secondary | ICD-10-CM

## 2018-03-04 DIAGNOSIS — R0602 Shortness of breath: Secondary | ICD-10-CM | POA: Diagnosis not present

## 2018-03-04 DIAGNOSIS — I2510 Atherosclerotic heart disease of native coronary artery without angina pectoris: Secondary | ICD-10-CM | POA: Diagnosis not present

## 2018-03-04 DIAGNOSIS — J90 Pleural effusion, not elsewhere classified: Secondary | ICD-10-CM

## 2018-03-04 DIAGNOSIS — K219 Gastro-esophageal reflux disease without esophagitis: Secondary | ICD-10-CM | POA: Diagnosis not present

## 2018-03-04 NOTE — Patient Instructions (Signed)
Follow up in 3 months

## 2018-03-04 NOTE — Progress Notes (Signed)
[place] [facility]  Chief Complaint  Patient presents with  . Follow-up    4 week follow up SOB. breathing is doing good    Constitutional: BP 128/78 (BP Location: Left Arm, Cuff Size: Normal)   Pulse 70   Ht 5' 6.5" (1.689 m)   Wt 156 lb 3.2 oz (70.9 kg)   SpO2 99%   BMI 24.83 kg/m   History of Present Illness: Stuart Watson is a 84 y.o. male former smoker with chronic cough and pleural effusions.  Since his last visit he had b/l pleurx catheters.  Breathing better.  Still has occasional cough from post nasal drip.  Energy level better.  Denies chest pain, fever, hemoptysis, skin rash, gland swelling.  Still has ankle swelling and uses compression hose.  He is getting about 900 ml/week from each pleurx catheter  CXR 02/26/18 reviewed by me >> basilar ATX  Comprehensive Respiratory Exam:  Appearance - well kempt  ENMT - nasal mucosa moist, turbinates clear, midline nasal septum, no dental lesions, no gingival bleeding, no oral exudates, no tonsillar hypertrophy Neck - no masses, trachea midline, no thyromegaly, no elevation in JVP Respiratory - normal appearance of chest wall, normal respiratory effort w/o accessory muscle use, no dullness on percussion, no wheezing or rales, b/l pleurx catheter dressings clean CV - s1s2 regular rate and rhythm, no murmurs, 1+ ankle edema, radial pulses symmetric GI - soft, non tender, no masses Lymph - no adenopathy noted in neck and axillary areas MSK - normal muscle strength and tone, normal gait Ext - no cyanosis, clubbing, or joint inflammation noted Skin - no rashes, lesions, or ulcers Neuro - oriented to person, place, and time Psych - normal mood and affect   Assessment/Plan:  Recurrent b/l pleural effusions with dyspnea on exertion. - improved clinically after pleurx catheter placement - f/u with TCTS for CXR and catheter management - if drainage persists, then might need to consider  pleurodesis - continue lasix    Patient Instructions  Follow up in 3 months    Chesley Mires, MD [place] 03/04/2018, 11:09 AM  Flow Sheet  Cardiac tests: Echo 12/04/17 >> EF 60 to 65%, PAS 35 mmHg  Pulmonary tests: PFT 04/21/14 >> FEV1 2.58 (98%), FEV1% 73, TLC 6.45 (96%), DLCO 74%, no BD CT chest 12/27/15 >> patchy consolidation LLL, GGO 1.8 cm RUL, patchy GGO RML and RLL CT chest 06/27/16 >> GGO's resolved, no change in nodules CT chest 12/05/17 >> large b/l pleural effusions Rt thoracentesis 12/07/17 >> LDH 74, protein 3.5, WBC 960, reactive mesothelial cells and lymphocytes  Events: 02/13/18 B/l pleurx catheters  Past Medical History: He  has a past medical history of Actinic keratoses, Arthritis, Chronic bilateral pleural effusions, Colon polyps, Dyspnea (07/13/2013), Edema (07/13/2013), GERD (gastroesophageal reflux disease), H/O TIA (transient ischemic attack) and stroke, Headache, History of BPH, History of TIA (transient ischemic attack) (07/13/2013), Lumbar radiculopathy, Need for shingles vaccine, Optic neuritis (10/2015), Perennial allergic rhinitis, Polymyalgia rheumatica ([place]) (07/13/2013), Restless leg syndrome, Retinal disease, Shingles, Temporal arteritis ([place]) (10/2015), and Wears glasses.  Past Surgical History: He  has a past surgical history that includes Vasectomy; Colonoscopy; drropy eyelid surgery; Esophagogastroduodenoscopy (egd) with propofol (N/A, 04/18/2016); BRAVO ph study (N/A, 04/18/2016); IR THORACENTESIS ASP PLEURAL SPACE W/IMG GUIDE (02/04/2018); IR THORACENTESIS ASP PLEURAL SPACE W/IMG GUIDE (02/06/2018); and Chest tube insertion (Right, 02/13/2018).  Family History: His family history includes Breast cancer in his mother; Coronary artery disease in his father and  mother; Diabetes in his father and mother; Berenice Primas' disease in his sister; Heart attack in his father; Stroke in his mother.  Social History: He  reports that he quit smoking  about 42 years ago. His smoking use included cigarettes. He has a 15.00 pack-year smoking history. He has never used smokeless tobacco. He reports that he drinks about 7.0 standard drinks of alcohol per week. He reports that he does not use drugs.  Medications: Allergies as of 03/04/2018   No Known Allergies     Medication List        Accurate as of 03/04/18 11:09 AM. Always use your most recent med list.          clopidogrel 75 MG tablet Commonly known as:  PLAVIX Take 75 mg by mouth daily with breakfast.   doxazosin 8 MG tablet Commonly known as:  CARDURA Take 8 mg by mouth daily.   furosemide 20 MG tablet Commonly known as:  LASIX Take 1 tablet (20 mg total) by mouth daily.   omeprazole 20 MG capsule Commonly known as:  PRILOSEC Take 1 capsule (20 mg total) by mouth daily.   simvastatin 20 MG tablet Commonly known as:  ZOCOR Take 20 mg by mouth daily.

## 2018-03-06 DIAGNOSIS — N4889 Other specified disorders of penis: Secondary | ICD-10-CM | POA: Diagnosis not present

## 2018-03-10 DIAGNOSIS — H04123 Dry eye syndrome of bilateral lacrimal glands: Secondary | ICD-10-CM | POA: Diagnosis not present

## 2018-03-10 DIAGNOSIS — H31001 Unspecified chorioretinal scars, right eye: Secondary | ICD-10-CM | POA: Diagnosis not present

## 2018-03-10 DIAGNOSIS — H5202 Hypermetropia, left eye: Secondary | ICD-10-CM | POA: Diagnosis not present

## 2018-03-10 DIAGNOSIS — H2513 Age-related nuclear cataract, bilateral: Secondary | ICD-10-CM | POA: Diagnosis not present

## 2018-03-10 DIAGNOSIS — J90 Pleural effusion, not elsewhere classified: Secondary | ICD-10-CM | POA: Diagnosis not present

## 2018-03-10 DIAGNOSIS — Z48813 Encounter for surgical aftercare following surgery on the respiratory system: Secondary | ICD-10-CM | POA: Diagnosis not present

## 2018-03-21 DIAGNOSIS — Z48813 Encounter for surgical aftercare following surgery on the respiratory system: Secondary | ICD-10-CM | POA: Diagnosis not present

## 2018-03-21 DIAGNOSIS — J90 Pleural effusion, not elsewhere classified: Secondary | ICD-10-CM | POA: Diagnosis not present

## 2018-03-28 DIAGNOSIS — Z48813 Encounter for surgical aftercare following surgery on the respiratory system: Secondary | ICD-10-CM | POA: Diagnosis not present

## 2018-03-28 DIAGNOSIS — J90 Pleural effusion, not elsewhere classified: Secondary | ICD-10-CM | POA: Diagnosis not present

## 2018-04-04 DIAGNOSIS — J90 Pleural effusion, not elsewhere classified: Secondary | ICD-10-CM | POA: Diagnosis not present

## 2018-04-04 DIAGNOSIS — Z48813 Encounter for surgical aftercare following surgery on the respiratory system: Secondary | ICD-10-CM | POA: Diagnosis not present

## 2018-04-07 DIAGNOSIS — L605 Yellow nail syndrome: Secondary | ICD-10-CM

## 2018-04-07 NOTE — Telephone Encounter (Signed)
VS please advise on pt's request.  Thanks!   I would like a referral to the [place] Department of Rheumatology and Immunology for these symptoms: edema in legs and feet, chronic sinusitis, yellow nails and pleural effusion.   The fax number is [phone].   Thank you  Barbaraann Share

## 2018-04-08 NOTE — Telephone Encounter (Signed)
Please arrange for referral to [facility] Rheumatology to assess for yellow nail syndrome.

## 2018-04-09 ENCOUNTER — Ambulatory Visit: Payer: Medicare Other | Admitting: Cardiothoracic Surgery

## 2018-04-11 DIAGNOSIS — Z48813 Encounter for surgical aftercare following surgery on the respiratory system: Secondary | ICD-10-CM | POA: Diagnosis not present

## 2018-04-11 DIAGNOSIS — J90 Pleural effusion, not elsewhere classified: Secondary | ICD-10-CM | POA: Diagnosis not present

## 2018-04-19 DIAGNOSIS — J918 Pleural effusion in other conditions classified elsewhere: Secondary | ICD-10-CM | POA: Diagnosis not present

## 2018-04-23 ENCOUNTER — Ambulatory Visit: Payer: Medicare Other | Admitting: Cardiothoracic Surgery

## 2018-04-24 DIAGNOSIS — C4441 Basal cell carcinoma of skin of scalp and neck: Secondary | ICD-10-CM | POA: Diagnosis not present

## 2018-04-24 DIAGNOSIS — D1801 Hemangioma of skin and subcutaneous tissue: Secondary | ICD-10-CM | POA: Diagnosis not present

## 2018-04-24 DIAGNOSIS — L821 Other seborrheic keratosis: Secondary | ICD-10-CM | POA: Diagnosis not present

## 2018-04-24 DIAGNOSIS — C44319 Basal cell carcinoma of skin of other parts of face: Secondary | ICD-10-CM | POA: Diagnosis not present

## 2018-04-24 DIAGNOSIS — D485 Neoplasm of uncertain behavior of skin: Secondary | ICD-10-CM | POA: Diagnosis not present

## 2018-04-24 DIAGNOSIS — L570 Actinic keratosis: Secondary | ICD-10-CM | POA: Diagnosis not present

## 2018-05-11 NOTE — Progress Notes (Signed)
ADVANCED HF CLINIC CONSULT NOTE  Referring Physician: Dr. [name] Primary Care: Dr. Roland Earl Primary Cardiologist: Dr. Geraldo Pitter  HPI:  [name] is an 84 y/o male with h/o temporal arteritis/polymalgia rheumatica, previous TIA/CVA and recurrent pleural effusions s/p bilateral Pleurex tubes referred by Dr. [name] for evaluation for possible amyloidosis.   He denies h/o known CAD. He had an ETT/Myoview in 7/19 during which he walked 5:30 on [place] Protocol with no CP or ECG changes. Nuclear imaging showed normal EF with fixed inferior defect felt to be diaphragmatic attenuation (similar to study in 2015).  He has not had a cath.   Echo 5/19 showed EF 60-65% with grade I DD. RV is normal with no evidence of PAH. Trivial TR.   He had a CT of the chest in 5/19 which showed 1. Moderately large bilateral pleural effusions  2. Small noncalcified lung nodules with a calcified granuloma in the right middle lobe. These changes most likely are postinflammatory or post infectious in origin. 3. Moderate thoracic aortic atherosclerosis and diffuse coronary artery calcifications. 4. Small pericardial effusion.  Wife says he developed problem with his nails in 2013 as well as chronic sinusitis and edema. Wife is concerned about yellow nail syndrome. Earlier this year he developed bilateral pelural effusions and underwent thoracentesis. Effusions recurred. He was seen by Dr. Geraldo Pitter in Cardiology and Dr. [name] in Pulmonary and then referred to Dr. [name]. He underwent placement of bilateral Pleurex tubes by Dr. [name] 02/13/2018. He continues to drain about 500cc on each side about 3x/week. Fluid analysis c/w transudative process. Cytology has been negative. Serum albumin now 2.7. Gets SOB with activity. + fatigue. Denies orthopnea or PND. Interestingly BNP only 45 in 5/19  Serologic w/u: ESR 4, CRP 0.3, ANA 1:40, CCP < 16, RF negative  (7/19)   PFT 04/21/14 >> FEV1 2.58 (98%), FEV1% 73, TLC  6.45 (96%), DLCO 74%, no BD  He denies any h/o HF.    Review of Systems: [y] = yes, [ ] = no   General: Weight gain [ ]; Weight loss [ ]; Anorexia [ ]; Fatigue [ y]; Fever [ ]; Chills [ ]; Weakness [y]  Cardiac: Chest pain/pressure [ ]; Resting SOB [ ]; Exertional SOB [ y]; Orthopnea [ ]; Pedal Edema [ y]; Palpitations [ ]; Syncope [ ]; Presyncope Blue.Reese ]; Paroxysmal nocturnal dyspnea[ ]  Pulmonary: Cough [ ]; Wheezing[ ]; Hemoptysis[ ]; Sputum [ ]; Snoring [ ]  GI: Vomiting[ ]; Dysphagia[ ]; Melena[ ]; Hematochezia [ ]; Heartburn[ ]; Abdominal pain [ ]; Constipation [ ]; Diarrhea [ ]; BRBPR [ ]  GU: Hematuria[ ]; Dysuria [ ]; Nocturia[ ]  Vascular: Pain in legs with walking [ ]; Pain in feet with lying flat [ ]; Non-healing sores [ ]; Stroke Blue.Reese ]; TIA Blue.Reese ]; Slurred speech [ ];  Neuro: Headaches[y]; Vertigo[ ]; Eugene Gavia ]; Paresthesias[ ];Blurred vision [ ]; Diplopia [ ]; Vision changes [ ]  Ortho/Skin: Arthritis Blue.Reese ]; Joint pain Blue.Reese ]; Muscle pain [ ]; Joint swelling [ ]; Back Pain Blue.Reese ]; Rash [ ]  Psych: Depression[ ]; Anxiety[ ]  Heme: Bleeding problems [ ]; Clotting disorders [ ]; Anemia [ ]  Endocrine: Diabetes [ ]; Thyroid dysfunction[ ]   Past Medical History:  Diagnosis Date  . Actinic keratoses   . Arthritis   . Chronic bilateral pleural effusions   . Colon polyps   . Dyspnea 07/13/2013   BNP normal  .  Edema 07/13/2013  . GERD (gastroesophageal reflux disease)    omeprazole 1-2 times daily  . H/O TIA (transient ischemic attack) and stroke    on statins and plavix -saw Dr.Sethi in the past  . Headache   . History of BPH   . History of TIA (transient ischemic attack) 07/13/2013  . Lumbar radiculopathy   . Need for shingles vaccine   . Optic neuritis 10/2015   left eye  . Perennial allergic rhinitis   . Polymyalgia rheumatica ([place]) 07/13/2013   Daily prednisone  . Restless leg syndrome   . Retinal disease    right eye since birth   . Shingles   . Temporal arteritis  ([place]) 10/2015  . Wears glasses     Current Outpatient Medications  Medication Sig Dispense Refill  . clopidogrel (PLAVIX) 75 MG tablet Take 75 mg by mouth daily with breakfast.    . doxazosin (CARDURA) 8 MG tablet Take 8 mg by mouth daily.    . furosemide (LASIX) 20 MG tablet Take 1 tablet (20 mg total) by mouth daily. 60 tablet 11  . omeprazole (PRILOSEC) 20 MG capsule Take 1 capsule (20 mg total) by mouth daily.    . simvastatin (ZOCOR) 20 MG tablet Take 20 mg by mouth daily.     No current facility-administered medications for this encounter.     No Known Allergies    Social History   Socioeconomic History  . Marital status: Married    Spouse name: Not on file  . Number of children: 2  . Years of education: Not on file  . Highest education level: Not on file  Occupational History  . Occupation: retired  Scientific laboratory technician  . Financial resource strain: Not on file  . Food insecurity:    Worry: Not on file    Inability: Not on file  . Transportation needs:    Medical: Not on file    Non-medical: Not on file  Tobacco Use  . Smoking status: Former Smoker    Packs/day: 1.00    Years: 15.00    Pack years: 15.00    Types: Cigarettes    Last attempt to quit: 07/23/1975    Years since quitting: 42.8  . Smokeless tobacco: Never Used  Substance and Sexual Activity  . Alcohol use: Yes    Alcohol/week: 7.0 standard drinks    Types: 7 Glasses of wine per week    Comment: 1 glass red wine daily  . Drug use: No  . Sexual activity: Not on file  Lifestyle  . Physical activity:    Days per week: Not on file    Minutes per session: Not on file  . Stress: Not on file  Relationships  . Social connections:    Talks on phone: Not on file    Gets together: Not on file    Attends religious service: Not on file    Active member of club or organization: Not on file    Attends meetings of clubs or organizations: Not on file    Relationship status: Not on file  . Intimate partner  violence:    Fear of current or ex partner: Not on file    Emotionally abused: Not on file    Physically abused: Not on file    Forced sexual activity: Not on file  Other Topics Concern  . Not on file  Social History Narrative  . Not on file      Family History  Problem Relation Age  of Onset  . Stroke Mother   . Coronary artery disease Mother   . Diabetes Mother   . Breast cancer Mother   . Heart attack Father   . Coronary artery disease Father   . Diabetes Father   . Graves' disease Sister     Vitals:   05/12/18 1140  BP: (!) 118/41  Pulse: 61  SpO2: 99%  Weight: 74.2 kg (163 lb 9.6 oz)    PHYSICAL EXAM: General:  Elderly. No respiratory difficulty HEENT: normal Neck: supple. no JVD. Carotids 2+ bilat; no bruits. No lymphadenopathy or thryomegaly appreciated. Cor: PMI nondisplaced. Regular rate & rhythm. No rubs, gallops or murmurs. Lungs: clear Pleurex tubes in place bilaterally  Abdomen: soft, nontender, nondistended. No hepatosplenomegaly. No bruits or masses. Good bowel sounds. Extremities: no cyanosis, clubbing, rash, 3+ edema knees down bilaterally. Severe degeneration of all fingernails Neuro: alert & oriented x 3, cranial nerves grossly intact. moves all 4 extremities w/o difficulty. Affect pleasant.  ECG: NSR 60 No ST-T wave abnormalities.  Low volts. Personally reviewed   ASSESSMENT & PLAN:  1. Recurrent transudative bilateral pleural effusions - unclear etiology. He has refractory pleural effusions and severe LE edema without any clear evidence of central volume overload, PAH or elevated intracardiac pressures on echo. (EF normal Grade I DD). BNP only 45 in 5/19 - s/p bilateral Pleurex tubes by Dr. [name] 02/13/18 - ESR 4, CRP 0.3, ANA 1:40, CCP < 16, RF negative  (7/19)  - Given recurrent effusions and low volts on ECG, agree with w/u for amyloid and other infiltrative processes. Check myeloma panel and PYP scan. Given concurrent symptoms of  sinusitis, fingernail changes, LE edema and pleural effusions his wife has raised the possibility of yellow nail syndrome. I suspect she is likely correct but will first rule out amyloid and he will also need a cath to make sure central pressures aren't elevated causing volume overload. If w/u unrevealing will have to consider IVIG as treatment.  - Change lasix to torsemide 40 daily. Add K -  Continue compression hose.   2. Coronary calcifications and abnormal stress test - currently denies s/s angina - Myoview results as above - continue RF management per Dr. Geraldo Pitter. - if/when we perform [facility] will also do coronary angio   Glori Bickers, MD  8:56 PM

## 2018-05-12 ENCOUNTER — Other Ambulatory Visit: Payer: Self-pay

## 2018-05-12 ENCOUNTER — Ambulatory Visit (HOSPITAL_COMMUNITY)
Admission: RE | Admit: 2018-05-12 | Discharge: 2018-05-12 | Disposition: A | Discharge status: Home / Self Care | Payer: Medicare Other | Source: Ambulatory Visit | Attending: Internal Medicine | Admitting: Internal Medicine

## 2018-05-12 VITALS — BP 118/41 | HR 61 | Wt 163.6 lb

## 2018-05-12 DIAGNOSIS — N400 Enlarged prostate without lower urinary tract symptoms: Secondary | ICD-10-CM | POA: Insufficient documentation

## 2018-05-12 DIAGNOSIS — M1990 Unspecified osteoarthritis, unspecified site: Secondary | ICD-10-CM | POA: Diagnosis not present

## 2018-05-12 DIAGNOSIS — Z87891 Personal history of nicotine dependence: Secondary | ICD-10-CM | POA: Diagnosis not present

## 2018-05-12 DIAGNOSIS — I313 Pericardial effusion (noninflammatory): Secondary | ICD-10-CM | POA: Diagnosis not present

## 2018-05-12 DIAGNOSIS — J90 Pleural effusion, not elsewhere classified: Secondary | ICD-10-CM | POA: Insufficient documentation

## 2018-05-12 DIAGNOSIS — Z833 Family history of diabetes mellitus: Secondary | ICD-10-CM | POA: Insufficient documentation

## 2018-05-12 DIAGNOSIS — R600 Localized edema: Secondary | ICD-10-CM | POA: Diagnosis not present

## 2018-05-12 DIAGNOSIS — Z8673 Personal history of transient ischemic attack (TIA), and cerebral infarction without residual deficits: Secondary | ICD-10-CM | POA: Insufficient documentation

## 2018-05-12 DIAGNOSIS — G2581 Restless legs syndrome: Secondary | ICD-10-CM | POA: Diagnosis not present

## 2018-05-12 DIAGNOSIS — K219 Gastro-esophageal reflux disease without esophagitis: Secondary | ICD-10-CM | POA: Insufficient documentation

## 2018-05-12 DIAGNOSIS — Z8249 Family history of ischemic heart disease and other diseases of the circulatory system: Secondary | ICD-10-CM | POA: Diagnosis not present

## 2018-05-12 DIAGNOSIS — R0600 Dyspnea, unspecified: Secondary | ICD-10-CM

## 2018-05-12 DIAGNOSIS — I2510 Atherosclerotic heart disease of native coronary artery without angina pectoris: Secondary | ICD-10-CM | POA: Insufficient documentation

## 2018-05-12 DIAGNOSIS — M315 Giant cell arteritis with polymyalgia rheumatica: Secondary | ICD-10-CM | POA: Insufficient documentation

## 2018-05-12 DIAGNOSIS — Z7902 Long term (current) use of antithrombotics/antiplatelets: Secondary | ICD-10-CM | POA: Diagnosis not present

## 2018-05-12 DIAGNOSIS — Z79899 Other long term (current) drug therapy: Secondary | ICD-10-CM | POA: Insufficient documentation

## 2018-05-12 DIAGNOSIS — J918 Pleural effusion in other conditions classified elsewhere: Secondary | ICD-10-CM | POA: Diagnosis not present

## 2018-05-12 DIAGNOSIS — R9439 Abnormal result of other cardiovascular function study: Secondary | ICD-10-CM | POA: Diagnosis not present

## 2018-05-12 DIAGNOSIS — Z803 Family history of malignant neoplasm of breast: Secondary | ICD-10-CM | POA: Insufficient documentation

## 2018-05-12 LAB — BASIC METABOLIC PANEL
ANION GAP: 5 (ref 5–15)
BUN: 17 mg/dL (ref 8–23)
CO2: 24 mmol/L (ref 22–32)
Calcium: 7.9 mg/dL — ABNORMAL LOW (ref 8.9–10.3)
Chloride: 108 mmol/L (ref 98–111)
Creatinine, Ser: 0.92 mg/dL (ref 0.61–1.24)
GFR calc non Af Amer: >60 mL/min (ref >60)
GLUCOSE: 96 mg/dL (ref 70–99)
Potassium: 3.9 mmol/L (ref 3.5–5.1)
Sodium: 137 mmol/L (ref 135–145)

## 2018-05-12 LAB — BRAIN NATRIURETIC PEPTIDE: B NATRIURETIC PEPTIDE 5: 76.9 pg/mL (ref 0.0–100.0)

## 2018-05-12 MED ORDER — POTASSIUM CHLORIDE CRYS ER 20 MEQ PO TBCR: 20.0000 meq | EXTENDED_RELEASE_TABLET | Freq: Every day | ORAL | 3 refills | Status: DC

## 2018-05-12 MED ORDER — TORSEMIDE 20 MG PO TABS: 40.0000 mg | ORAL_TABLET | Freq: Every day | ORAL | 6 refills | Status: DC

## 2018-05-12 NOTE — Patient Instructions (Signed)
Stop Furosemide  Start Torsemide 40 mg (2 tabs) daily  Start Potassium (k-dur) 20 meq daily  Labs today  Labs in 1 weeks  You have been ordered a PYP Scan.  This is done in the Radiology Department of [facility].  When you come for this test please plan to be there 2-3 hours.  Your physician recommends that you schedule a follow-up appointment in: 4 weeks

## 2018-05-13 ENCOUNTER — Other Ambulatory Visit: Payer: Self-pay | Admitting: Cardiothoracic Surgery

## 2018-05-13 DIAGNOSIS — I25119 Atherosclerotic heart disease of native coronary artery with unspecified angina pectoris: Secondary | ICD-10-CM

## 2018-05-13 LAB — MULTIPLE MYELOMA PANEL, SERUM
ALBUMIN/GLOB SERPL: 1.1 (ref 0.7–1.7)
ALPHA 1: 0.2 g/dL (ref 0.0–0.4)
Albumin SerPl Elph-Mcnc: 2.6 g/dL — ABNORMAL LOW (ref 2.9–4.4)
Alpha2 Glob SerPl Elph-Mcnc: 0.6 g/dL (ref 0.4–1.0)
B-Globulin SerPl Elph-Mcnc: 0.8 g/dL (ref 0.7–1.3)
Gamma Glob SerPl Elph-Mcnc: 1 g/dL (ref 0.4–1.8)
Globulin, Total: 2.6 g/dL (ref 2.2–3.9)
IGA: 126 mg/dL (ref 61–437)
IGM (IMMUNOGLOBULIN M), SRM: 23 mg/dL (ref 15–143)
IgG (Immunoglobin G), Serum: 1195 mg/dL (ref 700–1600)
M Protein SerPl Elph-Mcnc: 0.3 g/dL — ABNORMAL HIGH
TOTAL PROTEIN ELP: 5.2 g/dL — AB (ref 6.0–8.5)

## 2018-05-14 ENCOUNTER — Other Ambulatory Visit: Payer: Self-pay

## 2018-05-14 ENCOUNTER — Ambulatory Visit
Admission: RE | Admit: 2018-05-14 | Discharge: 2018-05-14 | Disposition: A | Discharge status: Home / Self Care | Payer: Medicare Other | Source: Ambulatory Visit | Attending: Cardiothoracic Surgery | Admitting: Cardiothoracic Surgery

## 2018-05-14 ENCOUNTER — Ambulatory Visit (INDEPENDENT_AMBULATORY_CARE_PROVIDER_SITE_OTHER): Payer: Medicare Other | Admitting: Cardiothoracic Surgery

## 2018-05-14 ENCOUNTER — Encounter: Payer: Self-pay | Admitting: Cardiothoracic Surgery

## 2018-05-14 VITALS — BP 116/64 | HR 68 | Resp 16 | Ht 66.5 in | Wt 156.6 lb

## 2018-05-14 DIAGNOSIS — Z9689 Presence of other specified functional implants: Secondary | ICD-10-CM

## 2018-05-14 DIAGNOSIS — I2510 Atherosclerotic heart disease of native coronary artery without angina pectoris: Secondary | ICD-10-CM | POA: Diagnosis not present

## 2018-05-14 DIAGNOSIS — J90 Pleural effusion, not elsewhere classified: Secondary | ICD-10-CM | POA: Diagnosis not present

## 2018-05-14 DIAGNOSIS — I2584 Coronary atherosclerosis due to calcified coronary lesion: Secondary | ICD-10-CM | POA: Diagnosis not present

## 2018-05-14 DIAGNOSIS — I25119 Atherosclerotic heart disease of native coronary artery with unspecified angina pectoris: Secondary | ICD-10-CM

## 2018-05-14 NOTE — Progress Notes (Signed)
PCP is Wenda Low, MD Referring Provider is Lauraine Rinne, NP  Chief Complaint  Patient presents with  . Pleural Effusion    BILATERAL with pleurX caths    HPI: Scheduled visit for bilateral Pleurx catheters.  Patient has had recurrent benign significant pleural effusions.  Drainage averages approximately 600 cc each side on Monday Wednesday Friday drainage schedule. Pleurx drainage schedule has significantly improved his shortness of breath and exercise tolerance. Patient is currently being evaluated by advanced heart failure clinic for diastolic heart failure versus amyloid disease versus yellow nail syndrome. A change in his diuretic from Lasix to Demadex resulted in a significant increase in diuresis. Chest x-ray performed today shows clear lung fields with minimal pleural effusion present and good catheter position bilaterally.  Past Medical History:  Diagnosis Date  . Actinic keratoses   . Arthritis   . Chronic bilateral pleural effusions   . Colon polyps   . Dyspnea 07/13/2013   BNP normal  . Edema 07/13/2013  . GERD (gastroesophageal reflux disease)    omeprazole 1-2 times daily  . H/O TIA (transient ischemic attack) and stroke    on statins and plavix -saw Dr.Sethi in the past  . Headache   . History of BPH   . History of TIA (transient ischemic attack) 07/13/2013  . Lumbar radiculopathy   . Need for shingles vaccine   . Optic neuritis 10/2015   left eye  . Perennial allergic rhinitis   . Polymyalgia rheumatica ([place]) 07/13/2013   Daily prednisone  . Restless leg syndrome   . Retinal disease    right eye since birth   . Shingles   . Temporal arteritis ([place]) 10/2015  . Wears glasses     Past Surgical History:  Procedure Laterality Date  . BRAVO [place] STUDY N/A 04/18/2016   Procedure: BRAVO [place];  Surgeon: Wilford Corner, MD;  Location: [facility];  Service: Endoscopy;  Laterality: N/A;  . CHEST TUBE INSERTION Right 02/13/2018   Procedure: INSERTION  PLEURAL DRAINAGE CATHETER;  Surgeon: Ivin Poot, MD;  Location: [facility];  Service: Thoracic;  Laterality: Right;  . COLONOSCOPY    . drropy eyelid surgery    . ESOPHAGOGASTRODUODENOSCOPY (EGD) WITH PROPOFOL N/A 04/18/2016   Procedure: ESOPHAGOGASTRODUODENOSCOPY (EGD) WITH PROPOFOL;  Surgeon: Wilford Corner, MD;  Location: [facility];  Service: Endoscopy;  Laterality: N/A;  . IR THORACENTESIS ASP PLEURAL SPACE W/IMG GUIDE  02/04/2018  . IR THORACENTESIS ASP PLEURAL SPACE W/IMG GUIDE  02/06/2018  . VASECTOMY      Family History  Problem Relation Age of Onset  . Stroke Mother   . Coronary artery disease Mother   . Diabetes Mother   . Breast cancer Mother   . Heart attack Father   . Coronary artery disease Father   . Diabetes Father   . Graves' disease Sister     Social History Social History   Tobacco Use  . Smoking status: Former Smoker    Packs/day: 1.00    Years: 15.00    Pack years: 15.00    Types: Cigarettes    Last attempt to quit: 07/23/1975    Years since quitting: 42.8  . Smokeless tobacco: Never Used  Substance Use Topics  . Alcohol use: Yes    Alcohol/week: 7.0 standard drinks    Types: 7 Glasses of wine per week    Comment: 1 glass red wine daily  . Drug use: No    Current Outpatient Medications  Medication Sig Dispense Refill  .  clopidogrel (PLAVIX) 75 MG tablet Take 75 mg by mouth daily with breakfast.    . doxazosin (CARDURA) 8 MG tablet Take 8 mg by mouth daily.    . Multiple Vitamins-Minerals (MULTIVITAMIN ADULT PO) Take 1 tablet by mouth daily.    . Omega-3 Fatty Acids (FISH OIL) 1200 MG CAPS Take 2 capsules by mouth daily.    Marland Kitchen omeprazole (PRILOSEC) 20 MG capsule Take 1 capsule (20 mg total) by mouth daily.    . potassium chloride SA (K-DUR,KLOR-CON) 20 MEQ tablet Take 1 tablet (20 mEq total) by mouth daily. 30 tablet 3  . simvastatin (ZOCOR) 20 MG tablet Take 20 mg by mouth daily.    Marland Kitchen torsemide (DEMADEX) 20 MG tablet Take 2 tablets (40 mg  total) by mouth daily. 60 tablet 6   No current facility-administered medications for this visit.     No Known Allergies  Review of Systems  Patient feels his abdominal and leg edema is improved after starting Demadex No shortness of breath or dry cough No chest pain or abdominal pain No fever, weight stable, appetite unchanged  BP 116/64 (BP Location: Right Arm, Patient Position: Sitting, Cuff Size: Normal) Comment (Cuff Size): MANUALLY  Pulse 68   Resp 16   Ht 5' 6.5" (1.689 m)   Wt 156 lb 9.6 oz (71 kg)   SpO2 98% Comment: ON RA  BMI 24.90 kg/m  Physical Exam Very nice elderly gentleman no acute distress Lungs clear No JVD Regular heart rhythm without gallop Abdomen mild ascites Mild pretibial edema Neuro intact  Diagnostic Tests: Chest x-ray clear  Impression: Continue current Monday Wednesday Friday drainage schedule of each catheter.  As medical therapy of his underlying medical condition progresses we will reduce the drainage schedule appropriately. Plan: Return in 1 month with chest x-ray.  Continue Monday Wednesday Friday schedule for draining bilateral Pleurx catheter for now.   Len Childs, MD [facility] [phone]

## 2018-05-16 ENCOUNTER — Telehealth (HOSPITAL_COMMUNITY): Payer: Self-pay

## 2018-05-16 DIAGNOSIS — R931 Abnormal findings on diagnostic imaging of heart and coronary circulation: Secondary | ICD-10-CM

## 2018-05-16 DIAGNOSIS — Z23 Encounter for immunization: Secondary | ICD-10-CM | POA: Diagnosis not present

## 2018-05-16 DIAGNOSIS — R799 Abnormal finding of blood chemistry, unspecified: Secondary | ICD-10-CM

## 2018-05-16 NOTE — Telephone Encounter (Signed)
Pt called with results Mild M-spike. May be MGUS. Please refer to Dr. Beryle Beams, referral was placed

## 2018-05-19 ENCOUNTER — Telehealth (HOSPITAL_COMMUNITY): Payer: Self-pay

## 2018-05-19 ENCOUNTER — Ambulatory Visit (HOSPITAL_COMMUNITY)
Admission: RE | Admit: 2018-05-19 | Discharge: 2018-05-19 | Disposition: A | Discharge status: Home / Self Care | Payer: Medicare Other | Source: Ambulatory Visit | Attending: Internal Medicine | Admitting: Internal Medicine

## 2018-05-19 DIAGNOSIS — R0600 Dyspnea, unspecified: Secondary | ICD-10-CM | POA: Insufficient documentation

## 2018-05-19 LAB — COMPREHENSIVE METABOLIC PANEL
ALK PHOS: 38 U/L (ref 38–126)
ALT: 13 U/L (ref 0–44)
AST: 26 U/L (ref 15–41)
Albumin: 2.8 g/dL — ABNORMAL LOW (ref 3.5–5.0)
Anion gap: 6 (ref 5–15)
BILIRUBIN TOTAL: 0.6 mg/dL (ref 0.3–1.2)
BUN: 24 mg/dL — ABNORMAL HIGH (ref 8–23)
CALCIUM: 8.1 mg/dL — AB (ref 8.9–10.3)
CO2: 29 mmol/L (ref 22–32)
CREATININE: 0.97 mg/dL (ref 0.61–1.24)
Chloride: 105 mmol/L (ref 98–111)
GFR calc non Af Amer: >60 mL/min (ref >60)
Glucose, Bld: 106 mg/dL — ABNORMAL HIGH (ref 70–99)
Potassium: 3.8 mmol/L (ref 3.5–5.1)
Sodium: 140 mmol/L (ref 135–145)
TOTAL PROTEIN: 6 g/dL — AB (ref 6.5–8.1)

## 2018-05-19 NOTE — Telephone Encounter (Signed)
Doris with internal med [phone]) called for pt referral

## 2018-05-23 ENCOUNTER — Encounter (HOSPITAL_COMMUNITY)
Admission: RE | Admit: 2018-05-23 | Discharge: 2018-05-23 | Disposition: A | Discharge status: Home / Self Care | Payer: Medicare Other | Source: Ambulatory Visit | Attending: Internal Medicine | Admitting: Internal Medicine

## 2018-05-23 DIAGNOSIS — R0600 Dyspnea, unspecified: Secondary | ICD-10-CM | POA: Diagnosis not present

## 2018-05-23 DIAGNOSIS — I509 Heart failure, unspecified: Secondary | ICD-10-CM | POA: Diagnosis not present

## 2018-05-23 MED ORDER — TECHNETIUM TC 99M PYROPHOSPHATE
21.2000 | Freq: Once | INTRAVENOUS | Status: AC
  Administered 2018-05-23: 21.2 via INTRAVENOUS
  Filled 2018-05-23: qty 22

## 2018-05-29 ENCOUNTER — Other Ambulatory Visit: Payer: Self-pay | Admitting: Oncology

## 2018-05-29 DIAGNOSIS — D472 Monoclonal gammopathy: Secondary | ICD-10-CM

## 2018-05-29 DIAGNOSIS — D649 Anemia, unspecified: Secondary | ICD-10-CM

## 2018-06-13 DIAGNOSIS — J918 Pleural effusion in other conditions classified elsewhere: Secondary | ICD-10-CM | POA: Diagnosis not present

## 2018-06-18 ENCOUNTER — Ambulatory Visit (HOSPITAL_COMMUNITY)
Admission: RE | Admit: 2018-06-18 | Discharge: 2018-06-18 | Disposition: A | Discharge status: Home / Self Care | Payer: Medicare Other | Source: Ambulatory Visit | Attending: Internal Medicine | Admitting: Internal Medicine

## 2018-06-18 ENCOUNTER — Other Ambulatory Visit (HOSPITAL_COMMUNITY): Payer: Self-pay | Admitting: Internal Medicine

## 2018-06-18 ENCOUNTER — Encounter (HOSPITAL_COMMUNITY): Payer: Self-pay | Admitting: Internal Medicine

## 2018-06-18 ENCOUNTER — Encounter (HOSPITAL_COMMUNITY): Payer: Self-pay

## 2018-06-18 VITALS — BP 122/56 | HR 67 | Wt 157.2 lb

## 2018-06-18 DIAGNOSIS — R600 Localized edema: Secondary | ICD-10-CM

## 2018-06-18 DIAGNOSIS — I2584 Coronary atherosclerosis due to calcified coronary lesion: Secondary | ICD-10-CM

## 2018-06-18 DIAGNOSIS — N400 Enlarged prostate without lower urinary tract symptoms: Secondary | ICD-10-CM | POA: Diagnosis not present

## 2018-06-18 DIAGNOSIS — Z823 Family history of stroke: Secondary | ICD-10-CM | POA: Diagnosis not present

## 2018-06-18 DIAGNOSIS — I38 Endocarditis, valve unspecified: Secondary | ICD-10-CM | POA: Diagnosis not present

## 2018-06-18 DIAGNOSIS — J90 Pleural effusion, not elsewhere classified: Secondary | ICD-10-CM | POA: Diagnosis not present

## 2018-06-18 DIAGNOSIS — K219 Gastro-esophageal reflux disease without esophagitis: Secondary | ICD-10-CM | POA: Insufficient documentation

## 2018-06-18 DIAGNOSIS — Z8249 Family history of ischemic heart disease and other diseases of the circulatory system: Secondary | ICD-10-CM | POA: Insufficient documentation

## 2018-06-18 DIAGNOSIS — M1990 Unspecified osteoarthritis, unspecified site: Secondary | ICD-10-CM | POA: Diagnosis not present

## 2018-06-18 DIAGNOSIS — Z87891 Personal history of nicotine dependence: Secondary | ICD-10-CM | POA: Diagnosis not present

## 2018-06-18 DIAGNOSIS — Z7902 Long term (current) use of antithrombotics/antiplatelets: Secondary | ICD-10-CM | POA: Diagnosis not present

## 2018-06-18 DIAGNOSIS — Z833 Family history of diabetes mellitus: Secondary | ICD-10-CM | POA: Diagnosis not present

## 2018-06-18 DIAGNOSIS — G2581 Restless legs syndrome: Secondary | ICD-10-CM | POA: Diagnosis not present

## 2018-06-18 DIAGNOSIS — I509 Heart failure, unspecified: Secondary | ICD-10-CM | POA: Diagnosis not present

## 2018-06-18 DIAGNOSIS — Z8349 Family history of other endocrine, nutritional and metabolic diseases: Secondary | ICD-10-CM | POA: Insufficient documentation

## 2018-06-18 DIAGNOSIS — Z79899 Other long term (current) drug therapy: Secondary | ICD-10-CM | POA: Insufficient documentation

## 2018-06-18 DIAGNOSIS — Z803 Family history of malignant neoplasm of breast: Secondary | ICD-10-CM | POA: Insufficient documentation

## 2018-06-18 DIAGNOSIS — Z8673 Personal history of transient ischemic attack (TIA), and cerebral infarction without residual deficits: Secondary | ICD-10-CM | POA: Insufficient documentation

## 2018-06-18 DIAGNOSIS — I2510 Atherosclerotic heart disease of native coronary artery without angina pectoris: Secondary | ICD-10-CM | POA: Diagnosis not present

## 2018-06-18 LAB — CBC
HCT: 41.3 % (ref 39.0–52.0)
Hemoglobin: 13 g/dL (ref 13.0–17.0)
MCH: 31.3 pg (ref 26.0–34.0)
MCHC: 31.5 g/dL (ref 30.0–36.0)
MCV: 99.3 fL (ref 80.0–100.0)
PLATELETS: 308 10*3/uL (ref 150–400)
RBC: 4.16 MIL/uL — ABNORMAL LOW (ref 4.22–5.81)
RDW: 13.7 % (ref 11.5–15.5)
WBC: 5.7 10*3/uL (ref 4.0–10.5)
nRBC: 0 % (ref 0.0–0.2)

## 2018-06-18 LAB — BASIC METABOLIC PANEL
Anion gap: 9 (ref 5–15)
BUN: 25 mg/dL — AB (ref 8–23)
CALCIUM: 8.1 mg/dL — AB (ref 8.9–10.3)
CO2: 25 mmol/L (ref 22–32)
CREATININE: 1.07 mg/dL (ref 0.61–1.24)
Chloride: 102 mmol/L (ref 98–111)
GFR calc Af Amer: >60 mL/min (ref >60)
GFR calc non Af Amer: >60 mL/min (ref >60)
GLUCOSE: 90 mg/dL (ref 70–99)
Potassium: 3.7 mmol/L (ref 3.5–5.1)
Sodium: 136 mmol/L (ref 135–145)

## 2018-06-18 LAB — PROTIME-INR
INR: 1.1
Prothrombin Time: 14.1 seconds (ref 11.4–15.2)

## 2018-06-18 MED ORDER — TORSEMIDE 20 MG PO TABS: ORAL_TABLET | ORAL | 6 refills | Status: DC

## 2018-06-18 MED ORDER — POTASSIUM CHLORIDE CRYS ER 20 MEQ PO TBCR: 40.0000 meq | EXTENDED_RELEASE_TABLET | Freq: Every day | ORAL | 3 refills | Status: DC

## 2018-06-18 NOTE — H&P (View-Only) (Signed)
ADVANCED HF CLINIC CONSULT NOTE  Referring Physician: Dr. [name] Primary Care: Dr. Roland Earl Primary Cardiologist: Dr. Geraldo Pitter  HPI: Stuart Watson is an 84 y/o male with h/o temporal arteritis/polymalgia rheumatica, previous TIA/CVA and recurrent pleural effusions s/p bilateral Pleurex tubes referred by Dr. [name] for evaluation for possible amyloidosis.   He denies h/o known CAD. He had an ETT/Myoview in 7/19 during which he walked 5:30 on [place] Protocol with no CP or ECG changes. Nuclear imaging showed normal EF with fixed inferior defect felt to be diaphragmatic attenuation (similar to study in 2015).  He has not had a cath.   Echo 5/19 showed EF 60-65% with grade I DD. RV is normal with no evidence of PAH. Trivial TR.   He had a CT of the chest in 5/19 which showed 1. Moderately large bilateral pleural effusions  2. Small noncalcified lung nodules with a calcified granuloma in the right middle lobe. These changes most likely are postinflammatory or post infectious in origin. 3. Moderate thoracic aortic atherosclerosis and diffuse coronary artery calcifications. 4. Small pericardial effusion.  Wife says he developed problem with his nails in 2013 as well as chronic sinusitis and edema. Wife is concerned about yellow nail syndrome. Earlier this year he developed bilateral pelural effusions and underwent thoracentesis. Effusions recurred. He was seen by Dr. Geraldo Pitter in Cardiology and Dr. [name] in Pulmonary and then referred to Dr. [name]. He underwent placement of bilateral Pleurex tubes by Dr. [name] 02/13/2018. Fluid analysis c/w transudative process. Cytology has been negative. Serum albumin now 2.7.  He has been able to cut back to twice a week on the pleurrex drain. Says it is usally about 1 liter on the right and left.   Today he return for 4 week follow up. Last visit he was switched from lasix to torsemide. Weight went as low as 145 pounds. Overall feeling ok. Having  some fatigue. Denies chest pain. Weight has leveled off at home to 152-153. Denies chest pain.  Mild SOB with 40 steps.He is able to walk up 40 steps to get in the gym. He is able to walk 1 miles per day and ride a stationary bike 11-12 miles per week.  Taking all medications. He has referred to Dr Beryle Beams and he has an appointment in January.   Serologic w/u: 05/12/2018 SPEP  + M Spikes.  05/23/2018 PYP Scan was not suggestive of TTR 01/2018 ESR 4, CRP 0.3, ANA 1:40, CCP < 16, RF negative    PFT 04/21/14 >> FEV1 2.58 (98%), FEV1% 73, TLC 6.45 (96%), DLCO 74%, no BD    Past Medical History:  Diagnosis Date  . Actinic keratoses   . Arthritis   . Chronic bilateral pleural effusions   . Colon polyps   . Dyspnea 07/13/2013   BNP normal  . Edema 07/13/2013  . GERD (gastroesophageal reflux disease)    omeprazole 1-2 times daily  . H/O TIA (transient ischemic attack) and stroke    on statins and plavix -saw Dr.Sethi in the past  . Headache   . History of BPH   . History of TIA (transient ischemic attack) 07/13/2013  . Lumbar radiculopathy   . Need for shingles vaccine   . Optic neuritis 10/2015   left eye  . Perennial allergic rhinitis   . Polymyalgia rheumatica ([place]) 07/13/2013   Daily prednisone  . Restless leg syndrome   . Retinal disease    right eye since birth   .  Shingles   . Temporal arteritis ([place]) 10/2015  . Wears glasses     Current Outpatient Medications  Medication Sig Dispense Refill  . clopidogrel (PLAVIX) 75 MG tablet Take 75 mg by mouth daily with breakfast.    . doxazosin (CARDURA) 8 MG tablet Take 8 mg by mouth daily.    . Multiple Vitamins-Minerals (MULTIVITAMIN ADULT PO) Take 1 tablet by mouth daily.    . Omega-3 Fatty Acids (FISH OIL) 1200 MG CAPS Take 2 capsules by mouth daily.    Marland Kitchen omeprazole (PRILOSEC) 20 MG capsule Take 1 capsule (20 mg total) by mouth daily.    . potassium chloride SA (K-DUR,KLOR-CON) 20 MEQ tablet Take 1 tablet (20 mEq total) by  mouth daily. 30 tablet 3  . simvastatin (ZOCOR) 20 MG tablet Take 20 mg by mouth daily.    Marland Kitchen torsemide (DEMADEX) 20 MG tablet Take 2 tablets (40 mg total) by mouth daily. 60 tablet 6   No current facility-administered medications for this encounter.     No Known Allergies    Social History   Socioeconomic History  . Marital status: Married    Spouse name: Not on file  . Number of children: 2  . Years of education: Not on file  . Highest education level: Not on file  Occupational History  . Occupation: retired  Scientific laboratory technician  . Financial resource strain: Not on file  . Food insecurity:    Worry: Not on file    Inability: Not on file  . Transportation needs:    Medical: Not on file    Non-medical: Not on file  Tobacco Use  . Smoking status: Former Smoker    Packs/day: 1.00    Years: 15.00    Pack years: 15.00    Types: Cigarettes    Last attempt to quit: 07/23/1975    Years since quitting: 42.9  . Smokeless tobacco: Never Used  Substance and Sexual Activity  . Alcohol use: Yes    Alcohol/week: 7.0 standard drinks    Types: 7 Glasses of wine per week    Comment: 1 glass red wine daily  . Drug use: No  . Sexual activity: Not on file  Lifestyle  . Physical activity:    Days per week: Not on file    Minutes per session: Not on file  . Stress: Not on file  Relationships  . Social connections:    Talks on phone: Not on file    Gets together: Not on file    Attends religious service: Not on file    Active member of club or organization: Not on file    Attends meetings of clubs or organizations: Not on file    Relationship status: Not on file  . Intimate partner violence:    Fear of current or ex partner: Not on file    Emotionally abused: Not on file    Physically abused: Not on file    Forced sexual activity: Not on file  Other Topics Concern  . Not on file  Social History Narrative  . Not on file      Family History  Problem Relation Age of Onset  .  Stroke Mother   . Coronary artery disease Mother   . Diabetes Mother   . Breast cancer Mother   . Heart attack Father   . Coronary artery disease Father   . Diabetes Father   . Graves' disease Sister     Vitals:   06/18/18 1039  BP: (!) 122/56  Pulse: 67  SpO2: 97%  Weight: 71.3 kg (157 lb 3.2 oz)   Wt Readings from Last 3 Encounters:  06/18/18 71.3 kg (157 lb 3.2 oz)  05/14/18 71 kg (156 lb 9.6 oz)  05/12/18 74.2 kg (163 lb 9.6 oz)    PHYSICAL EXAM: General:  Well appearing. No resp difficulty HEENT: normal Neck: supple. JVP ~10  Carotids 2+ bilat; no bruits. No lymphadenopathy or thryomegaly appreciated. Cor: PMI nondisplaced. Regular rate & rhythm. No rubs, gallops or murmurs. Lungs: clear  R and L pleurex catheter.  Abdomen: soft, nontender, distended. No hepatosplenomegaly. No bruits or masses. Good bowel sounds. Extremities: no cyanosis, clubbing, rash, R and LLE 3+  Edema and compression stockings. Diffuse nail atrophy Neuro: alert & orientedx3, cranial nerves grossly intact. moves all 4 extremities w/o difficulty. Affect pleasant    ASSESSMENT & PLAN:  1. Recurrent transudative bilateral pleural effusions - unclear etiology. He has refractory pleural effusions and severe LE edema without any clear evidence of central volume overload, PAH or elevated intracardiac pressures on echo. (EF normal Grade I DD). 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 Referred to Dr Beryle Beams for possible I IG.  - s/p bilateral Pleurex tubes by Dr. [name] 02/13/18 - ESR 4, CRP 0.3, ANA 1:40, CCP < 16, RF negative  (7/19)  - Low volts on ECG. - Myeloma panel with small M-spike with IgG monoclonal with kappa light chains. Likely MGUS -PYP scan negative for TTR.  - Still volume overloaded. Continue torsemide 40 mg in am and add 20 mg in pm. Increase potassium to 40 meq daily. Check BMET in 7 days.  - Discussed limiting fluid intake to < 2 liters per day.  -  Continue  compression hose.  - Proceed with R/L cath  2. Coronary calcifications and abnormal stress test - No s/s ischemia.  - Will proceed with R/LHC as above  Check BMEt today and in 4 week.s  Follow up in 4-6 weeks to reassess volume status.   Stuart Grinder, Stuart Watson  11:01 AM   Patient seen and examined with the above-signed Advanced Practice Provider and/or Housestaff. I personally reviewed laboratory data, imaging studies and relevant notes. I independently examined the patient and formulated the important aspects of the plan. I have edited the note to reflect any of my changes or salient points. I have personally discussed the plan with the patient and/or family.  Volume status improving but still elevated. Will increase torsemide and plan R/L cath to assess for [facility] and to evaluate coronary calcifications seen on CT.  PYP scan viewed personally and no evidence of TTR. SPEP with small M-spike -> suspect MGUS.   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 Will refer to Dr. Beryle Beams for evaluation of MGUS and possible IVIG for YNS. I called and discussed with him by phone today.   Stuart Bickers, MD  1:44 PM

## 2018-06-18 NOTE — Patient Instructions (Signed)
Labs done today  Labs will need to be done in 1 week  START Torsemide 20 mg (1 tab) in the evening in addition to your 40 mg (2 tabs) in the morning.  INCREASE Potassium 40 meq (2 tabs) daily  Your physician has requested that you have a cardiac MRI. Cardiac MRI uses a computer to create images of your heart as its beating, producing both still and moving pictures of your heart and major blood vessels. For further information please visit [email or web address] Please follow the instruction sheet given to you today for more information.  Your physician has requested that you have a cardiac catheterization. Cardiac catheterization is used to diagnose and/or treat various heart conditions. Doctors may recommend this procedure for a number of different reasons. The most common reason is to evaluate chest pain. Chest pain can be a symptom of coronary artery disease (CAD), and cardiac catheterization can show whether plaque is narrowing or blocking your heart's arteries. This procedure is also used to evaluate the valves, as well as measure the blood flow and oxygen levels in different parts of your heart. For further information please visit [email or web address].[email or web address]. Please follow instruction sheet, as given.  Follow up with Dr. [name] in 4-6 weeks

## 2018-06-18 NOTE — Progress Notes (Signed)
ADVANCED [facility] CLINIC CONSULT NOTE  Referring Physician: Dr. [name] Primary Care: Dr. Roland Earl Primary Cardiologist: Dr. Geraldo Pitter  HPI: [name] is an 84 y/o male with h/o temporal arteritis/polymalgia rheumatica, previous TIA/CVA and recurrent pleural effusions s/p bilateral Pleurex tubes referred by Dr. [name] for evaluation for possible amyloidosis.   He denies h/o known CAD. He had an ETT/Myoview in 7/19 during which he walked 5:30 on [place] Protocol with no CP or ECG changes. Nuclear imaging showed normal EF with fixed inferior defect felt to be diaphragmatic attenuation (similar to study in 2015).  He has not had a cath.   Echo 5/19 showed EF 60-65% with grade I DD. RV is normal with no evidence of PAH. Trivial TR.   He had a CT of the chest in 5/19 which showed 1. Moderately large bilateral pleural effusions  2. Small noncalcified lung nodules with a calcified granuloma in the right middle lobe. These changes most likely are postinflammatory or post infectious in origin. 3. Moderate thoracic aortic atherosclerosis and diffuse coronary artery calcifications. 4. Small pericardial effusion.  Wife says he developed problem with his nails in 2013 as well as chronic sinusitis and edema. Wife is concerned about yellow nail syndrome. Earlier this year he developed bilateral pelural effusions and underwent thoracentesis. Effusions recurred. He was seen by Dr. Geraldo Pitter in Cardiology and Dr. [name] in Pulmonary and then referred to Dr. [name]. He underwent placement of bilateral Pleurex tubes by Dr. [name] 02/13/2018. Fluid analysis c/w transudative process. Cytology has been negative. Serum albumin now 2.7.  He has been able to cut back to twice a week on the pleurrex drain. Says it is usally about 1 liter on the right and left.   Today he return for 4 week follow up. Last visit he was switched from lasix to torsemide. Weight went as low as 145 pounds. Overall feeling ok. Having  some fatigue. Denies chest pain. Weight has leveled off at home to 152-153. Denies chest pain.  Mild SOB with 40 steps.He is able to walk up 40 steps to get in the gym. He is able to walk 1 miles per day and ride a stationary bike 11-12 miles per week.  Taking all medications. He has referred to Dr Beryle Beams and he has an appointment in January.   Serologic w/u: 05/12/2018 SPEP  + M Spikes.  05/23/2018 PYP Scan was not suggestive of TTR 01/2018 ESR 4, CRP 0.3, ANA 1:40, CCP < 16, RF negative    PFT 04/21/14 >> FEV1 2.58 (98%), FEV1% 73, TLC 6.45 (96%), DLCO 74%, no BD    Past Medical History:  Diagnosis Date  . Actinic keratoses   . Arthritis   . Chronic bilateral pleural effusions   . Colon polyps   . Dyspnea 07/13/2013   BNP normal  . Edema 07/13/2013  . GERD (gastroesophageal reflux disease)    omeprazole 1-2 times daily  . H/O TIA (transient ischemic attack) and stroke    on statins and plavix -saw Dr.Sethi in the past  . Headache   . History of BPH   . History of TIA (transient ischemic attack) 07/13/2013  . Lumbar radiculopathy   . Need for shingles vaccine   . Optic neuritis 10/2015   left eye  . Perennial allergic rhinitis   . Polymyalgia rheumatica ([place]) 07/13/2013   Daily prednisone  . Restless leg syndrome   . Retinal disease    right eye since birth   .  Shingles   . Temporal arteritis ([place]) 10/2015  . Wears glasses     Current Outpatient Medications  Medication Sig Dispense Refill  . clopidogrel (PLAVIX) 75 MG tablet Take 75 mg by mouth daily with breakfast.    . doxazosin (CARDURA) 8 MG tablet Take 8 mg by mouth daily.    . Multiple Vitamins-Minerals (MULTIVITAMIN ADULT PO) Take 1 tablet by mouth daily.    . Omega-3 Fatty Acids (FISH OIL) 1200 MG CAPS Take 2 capsules by mouth daily.    Marland Kitchen omeprazole (PRILOSEC) 20 MG capsule Take 1 capsule (20 mg total) by mouth daily.    . potassium chloride SA (K-DUR,KLOR-CON) 20 MEQ tablet Take 1 tablet (20 mEq total) by  mouth daily. 30 tablet 3  . simvastatin (ZOCOR) 20 MG tablet Take 20 mg by mouth daily.    Marland Kitchen torsemide (DEMADEX) 20 MG tablet Take 2 tablets (40 mg total) by mouth daily. 60 tablet 6   No current facility-administered medications for this encounter.     No Known Allergies    Social History   Socioeconomic History  . Marital status: Married    Spouse name: Not on file  . Number of children: 2  . Years of education: Not on file  . Highest education level: Not on file  Occupational History  . Occupation: retired  Scientific laboratory technician  . Financial resource strain: Not on file  . Food insecurity:    Worry: Not on file    Inability: Not on file  . Transportation needs:    Medical: Not on file    Non-medical: Not on file  Tobacco Use  . Smoking status: Former Smoker    Packs/day: 1.00    Years: 15.00    Pack years: 15.00    Types: Cigarettes    Last attempt to quit: 07/23/1975    Years since quitting: 42.9  . Smokeless tobacco: Never Used  Substance and Sexual Activity  . Alcohol use: Yes    Alcohol/week: 7.0 standard drinks    Types: 7 Glasses of wine per week    Comment: 1 glass red wine daily  . Drug use: No  . Sexual activity: Not on file  Lifestyle  . Physical activity:    Days per week: Not on file    Minutes per session: Not on file  . Stress: Not on file  Relationships  . Social connections:    Talks on phone: Not on file    Gets together: Not on file    Attends religious service: Not on file    Active member of club or organization: Not on file    Attends meetings of clubs or organizations: Not on file    Relationship status: Not on file  . Intimate partner violence:    Fear of current or ex partner: Not on file    Emotionally abused: Not on file    Physically abused: Not on file    Forced sexual activity: Not on file  Other Topics Concern  . Not on file  Social History Narrative  . Not on file      Family History  Problem Relation Age of Onset  .  Stroke Mother   . Coronary artery disease Mother   . Diabetes Mother   . Breast cancer Mother   . Heart attack Father   . Coronary artery disease Father   . Diabetes Father   . Graves' disease Sister     Vitals:   06/18/18 1039  BP: (!) 122/56  Pulse: 67  SpO2: 97%  Weight: 71.3 kg (157 lb 3.2 oz)   Wt Readings from Last 3 Encounters:  06/18/18 71.3 kg (157 lb 3.2 oz)  05/14/18 71 kg (156 lb 9.6 oz)  05/12/18 74.2 kg (163 lb 9.6 oz)    PHYSICAL EXAM: General:  Well appearing. No resp difficulty HEENT: normal Neck: supple. JVP ~10  Carotids 2+ bilat; no bruits. No lymphadenopathy or thryomegaly appreciated. Cor: PMI nondisplaced. Regular rate & rhythm. No rubs, gallops or murmurs. Lungs: clear  R and L pleurex catheter.  Abdomen: soft, nontender, distended. No hepatosplenomegaly. No bruits or masses. Good bowel sounds. Extremities: no cyanosis, clubbing, rash, R and LLE 3+  Edema and compression stockings. Diffuse nail atrophy Neuro: alert & orientedx3, cranial nerves grossly intact. moves all 4 extremities w/o difficulty. Affect pleasant    ASSESSMENT & PLAN:  1. Recurrent transudative bilateral pleural effusions - unclear etiology. He has refractory pleural effusions and severe LE edema without any clear evidence of central volume overload, PAH or elevated intracardiac pressures on echo. (EF normal Grade I DD). 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 Referred to Dr Beryle Beams for possible I IG.  - s/p bilateral Pleurex tubes by Dr. [name] 02/13/18 - ESR 4, CRP 0.3, ANA 1:40, CCP < 16, RF negative  (7/19)  - Low volts on ECG. - Myeloma panel with small M-spike with IgG monoclonal with kappa light chains. Likely MGUS -PYP scan negative for TTR.  - Still volume overloaded. Continue torsemide 40 mg in am and add 20 mg in pm. Increase potassium to 40 meq daily. Check BMET in 7 days.  - Discussed limiting fluid intake to < 2 liters per day.  -  Continue  compression hose.  - Proceed with R/L cath  2. Coronary calcifications and abnormal stress test - No s/s ischemia.  - Will proceed with R/LHC as above  Check BMEt today and in 4 week.s  Follow up in 4-6 weeks to reassess volume status.   Darrick Grinder, NP  11:01 AM   Patient seen and examined with the above-signed Advanced Practice Provider and/or Housestaff. I personally reviewed laboratory data, imaging studies and relevant notes. I independently examined the patient and formulated the important aspects of the plan. I have edited the note to reflect any of my changes or salient points. I have personally discussed the plan with the patient and/or family.  Volume status improving but still elevated. Will increase torsemide and plan R/L cath to assess for [facility] and to evaluate coronary calcifications seen on CT.  PYP scan viewed personally and no evidence of TTR. SPEP with small M-spike -> suspect MGUS.   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 Will refer to Dr. Beryle Beams for evaluation of MGUS and possible IVIG for YNS. I called and discussed with him by phone today.   Glori Bickers, MD  1:44 PM

## 2018-06-24 ENCOUNTER — Other Ambulatory Visit: Payer: Self-pay | Admitting: Cardiothoracic Surgery

## 2018-06-24 ENCOUNTER — Encounter: Payer: Self-pay | Admitting: *Deleted

## 2018-06-24 ENCOUNTER — Other Ambulatory Visit: Payer: Self-pay

## 2018-06-24 ENCOUNTER — Encounter (HOSPITAL_COMMUNITY)
Admission: RE | Disposition: A | Discharge status: Home / Self Care | Payer: Self-pay | Source: Ambulatory Visit | Attending: Internal Medicine

## 2018-06-24 ENCOUNTER — Ambulatory Visit (HOSPITAL_COMMUNITY)
Admission: RE | Admit: 2018-06-24 | Discharge: 2018-06-24 | Disposition: A | Discharge status: Home / Self Care | Payer: Medicare Other | Source: Ambulatory Visit | Attending: Internal Medicine | Admitting: Internal Medicine

## 2018-06-24 ENCOUNTER — Encounter (HOSPITAL_COMMUNITY): Payer: Self-pay | Admitting: Internal Medicine

## 2018-06-24 ENCOUNTER — Telehealth: Payer: Self-pay | Admitting: *Deleted

## 2018-06-24 DIAGNOSIS — Z87891 Personal history of nicotine dependence: Secondary | ICD-10-CM | POA: Insufficient documentation

## 2018-06-24 DIAGNOSIS — I509 Heart failure, unspecified: Secondary | ICD-10-CM

## 2018-06-24 DIAGNOSIS — I2584 Coronary atherosclerosis due to calcified coronary lesion: Secondary | ICD-10-CM | POA: Insufficient documentation

## 2018-06-24 DIAGNOSIS — Z8249 Family history of ischemic heart disease and other diseases of the circulatory system: Secondary | ICD-10-CM | POA: Insufficient documentation

## 2018-06-24 DIAGNOSIS — Z8673 Personal history of transient ischemic attack (TIA), and cerebral infarction without residual deficits: Secondary | ICD-10-CM | POA: Diagnosis not present

## 2018-06-24 DIAGNOSIS — R911 Solitary pulmonary nodule: Secondary | ICD-10-CM

## 2018-06-24 DIAGNOSIS — Z7902 Long term (current) use of antithrombotics/antiplatelets: Secondary | ICD-10-CM | POA: Insufficient documentation

## 2018-06-24 DIAGNOSIS — K219 Gastro-esophageal reflux disease without esophagitis: Secondary | ICD-10-CM | POA: Insufficient documentation

## 2018-06-24 DIAGNOSIS — I5032 Chronic diastolic (congestive) heart failure: Secondary | ICD-10-CM

## 2018-06-24 DIAGNOSIS — G2581 Restless legs syndrome: Secondary | ICD-10-CM | POA: Insufficient documentation

## 2018-06-24 DIAGNOSIS — J90 Pleural effusion, not elsewhere classified: Secondary | ICD-10-CM | POA: Diagnosis not present

## 2018-06-24 DIAGNOSIS — I2510 Atherosclerotic heart disease of native coronary artery without angina pectoris: Secondary | ICD-10-CM | POA: Diagnosis not present

## 2018-06-24 DIAGNOSIS — M315 Giant cell arteritis with polymyalgia rheumatica: Secondary | ICD-10-CM | POA: Insufficient documentation

## 2018-06-24 HISTORY — PX: RIGHT/LEFT HEART CATH AND CORONARY ANGIOGRAPHY: CATH118266

## 2018-06-24 LAB — POCT I-STAT 3, VENOUS BLOOD GAS (G3P V)
Acid-Base Excess: 1 mmol/L (ref 0.0–2.0)
Acid-Base Excess: 2 mmol/L (ref 0.0–2.0)
BICARBONATE: 27.2 mmol/L (ref 20.0–28.0)
Bicarbonate: 26 mmol/L (ref 20.0–28.0)
O2 SAT: 74 %
O2 Saturation: 76 %
PO2 VEN: 41 mmHg (ref 32.0–45.0)
TCO2: 27 mmol/L (ref 22–32)
TCO2: 28 mmol/L (ref 22–32)
pCO2, Ven: 40.3 mmHg — ABNORMAL LOW (ref 44.0–60.0)
pCO2, Ven: 42.1 mmHg — ABNORMAL LOW (ref 44.0–60.0)
pH, Ven: 7.418 (ref 7.250–7.430)
pH, Ven: 7.418 (ref 7.250–7.430)
pO2, Ven: 39 mmHg (ref 32.0–45.0)

## 2018-06-24 LAB — POCT I-STAT 3, ART BLOOD GAS (G3+)
Acid-Base Excess: 2 mmol/L (ref 0.0–2.0)
Acid-Base Excess: 2 mmol/L (ref 0.0–2.0)
Bicarbonate: 26.2 mmol/L (ref 20.0–28.0)
Bicarbonate: 26.9 mmol/L (ref 20.0–28.0)
O2 SAT: 93 %
O2 SAT: 77 %
TCO2: 27 mmol/L (ref 22–32)
TCO2: 28 mmol/L (ref 22–32)
pCO2 arterial: 40.1 mmHg (ref 32.0–48.0)
pCO2 arterial: 42.1 mmHg (ref 32.0–48.0)
pH, Arterial: 7.423 (ref 7.350–7.450)
pH, Arterial: 7.413 (ref 7.350–7.450)
pO2, Arterial: 66 mmHg — ABNORMAL LOW (ref 83.0–108.0)
pO2, Arterial: 42 mmHg — ABNORMAL LOW (ref 83.0–108.0)

## 2018-06-24 SURGERY — RIGHT/LEFT HEART CATH AND CORONARY ANGIOGRAPHY
Anesthesia: LOCAL

## 2018-06-24 MED ORDER — FENTANYL CITRATE (PF) 100 MCG/2ML IJ SOLN
INTRAMUSCULAR | Status: DC | PRN
  Administered 2018-06-24: 25 ug via INTRAVENOUS

## 2018-06-24 MED ORDER — SODIUM CHLORIDE 0.9 % WEIGHT BASED INFUSION: 1.0000 mL/kg/h | INTRAVENOUS | Status: DC

## 2018-06-24 MED ORDER — SODIUM CHLORIDE 0.9% FLUSH: 3.0000 mL | INTRAVENOUS | Status: DC | PRN

## 2018-06-24 MED ORDER — ACETAMINOPHEN 325 MG PO TABS: 650.0000 mg | ORAL_TABLET | ORAL | Status: DC | PRN

## 2018-06-24 MED ORDER — LIDOCAINE HCL (PF) 1 % IJ SOLN
INTRAMUSCULAR | Status: DC | PRN
  Administered 2018-06-24: 4 mL

## 2018-06-24 MED ORDER — VERAPAMIL HCL 2.5 MG/ML IV SOLN
INTRAVENOUS | Status: DC | PRN
  Administered 2018-06-24: 10 mL via INTRA_ARTERIAL

## 2018-06-24 MED ORDER — MIDAZOLAM HCL 2 MG/2ML IJ SOLN
INTRAMUSCULAR | Status: AC
  Filled 2018-06-24: qty 2

## 2018-06-24 MED ORDER — SODIUM CHLORIDE 0.9 % IV SOLN: 250.0000 mL | INTRAVENOUS | Status: DC | PRN

## 2018-06-24 MED ORDER — SODIUM CHLORIDE 0.9% FLUSH: 3.0000 mL | Freq: Two times a day (BID) | INTRAVENOUS | Status: DC

## 2018-06-24 MED ORDER — HEPARIN SODIUM (PORCINE) 1000 UNIT/ML IJ SOLN
INTRAMUSCULAR | Status: AC
  Filled 2018-06-24: qty 1

## 2018-06-24 MED ORDER — SODIUM CHLORIDE 0.9 % IV SOLN: INTRAVENOUS | Status: DC

## 2018-06-24 MED ORDER — ONDANSETRON HCL 4 MG/2ML IJ SOLN: 4.0000 mg | Freq: Four times a day (QID) | INTRAMUSCULAR | Status: DC | PRN

## 2018-06-24 MED ORDER — ASPIRIN 81 MG PO CHEW
81.0000 mg | CHEWABLE_TABLET | ORAL | Status: AC
  Administered 2018-06-24: 81 mg via ORAL
  Filled 2018-06-24: qty 1

## 2018-06-24 MED ORDER — LIDOCAINE HCL (PF) 1 % IJ SOLN
INTRAMUSCULAR | Status: AC
  Filled 2018-06-24: qty 30

## 2018-06-24 MED ORDER — SODIUM CHLORIDE 0.9 % WEIGHT BASED INFUSION
3.0000 mL/kg/h | INTRAVENOUS | Status: AC
  Administered 2018-06-24: 3 mL/kg/h via INTRAVENOUS

## 2018-06-24 MED ORDER — FENTANYL CITRATE (PF) 100 MCG/2ML IJ SOLN
INTRAMUSCULAR | Status: AC
  Filled 2018-06-24: qty 2

## 2018-06-24 MED ORDER — MIDAZOLAM HCL 2 MG/2ML IJ SOLN
INTRAMUSCULAR | Status: DC | PRN
  Administered 2018-06-24: 1 mg via INTRAVENOUS

## 2018-06-24 MED ORDER — HEPARIN SODIUM (PORCINE) 1000 UNIT/ML IJ SOLN
INTRAMUSCULAR | Status: DC | PRN
  Administered 2018-06-24: 3000 [IU] via INTRAVENOUS

## 2018-06-24 MED ORDER — HEPARIN (PORCINE) IN NACL 1000-0.9 UT/500ML-% IV SOLN
INTRAVENOUS | Status: DC | PRN
  Administered 2018-06-24 (×2): 500 mL

## 2018-06-24 MED ORDER — VERAPAMIL HCL 2.5 MG/ML IV SOLN
INTRAVENOUS | Status: AC
  Filled 2018-06-24: qty 2

## 2018-06-24 MED ORDER — HEPARIN (PORCINE) IN NACL 1000-0.9 UT/500ML-% IV SOLN
INTRAVENOUS | Status: AC
  Filled 2018-06-24: qty 1000

## 2018-06-24 MED ORDER — IOHEXOL 350 MG/ML SOLN
INTRAVENOUS | Status: DC | PRN
  Administered 2018-06-24: 95 mL via INTRA_ARTERIAL

## 2018-06-24 SURGICAL SUPPLY — 15 items
CATH 5FR JL3.5 JR4 ANG PIG MP (CATHETERS) ×2 IMPLANT
CATH BALLN WEDGE 5F 110CM (CATHETERS) ×2 IMPLANT
CATH INFINITI 5 FR AR2 MOD (CATHETERS) ×2 IMPLANT
CATH INFINITI 5 FR LCB (CATHETERS) ×2 IMPLANT
CATH INFINITI 5FR AL1 (CATHETERS) ×2 IMPLANT
DEVICE RAD COMP TR BAND LRG (VASCULAR PRODUCTS) ×2 IMPLANT
GLIDESHEATH SLEND SS 6F .021 (SHEATH) ×2 IMPLANT
GUIDEWIRE .025 260CM (WIRE) ×2 IMPLANT
GUIDEWIRE INQWIRE 1.5J.035X260 (WIRE) ×1 IMPLANT
INQWIRE 1.5J .035X260CM (WIRE) ×2
KIT HEART LEFT (KITS) ×2 IMPLANT
PACK CARDIAC CATHETERIZATION (CUSTOM PROCEDURE TRAY) ×2 IMPLANT
SHEATH GLIDE SLENDER 4/5FR (SHEATH) ×2 IMPLANT
TRANSDUCER W/STOPCOCK (MISCELLANEOUS) ×2 IMPLANT
TUBING CIL FLEX 10 FLL-RA (TUBING) ×2 IMPLANT

## 2018-06-24 NOTE — Interval H&P Note (Signed)
History and Physical Interval Note:  06/24/2018 12:49 PM  Stuart Watson  has presented today for surgery, with the diagnosis of hf  The various methods of treatment have been discussed with the patient and family. After consideration of risks, benefits and other options for treatment, the patient has consented to  Procedure(s): RIGHT/LEFT HEART CATH AND CORONARY ANGIOGRAPHY (N/A) and possible coronary angioplasty as a surgical intervention .  The patient's history has been reviewed, patient examined, no change in status, stable for surgery.  I have reviewed the patient's chart and labs.  Questions were answered to the patient's satisfaction.     Alpha Mysliwiec

## 2018-06-24 NOTE — Telephone Encounter (Signed)
Left message regarding cardiac mri scheduled for 07/08/18 @ 3:00pm at [facility].  Will mail information to patient.

## 2018-06-24 NOTE — Discharge Instructions (Signed)
Radial Site Care °Refer to this sheet in the next few weeks. These instructions provide you with information about caring for yourself after your procedure. Your health care provider may also give you more specific instructions. Your treatment has been planned according to current medical practices, but problems sometimes occur. Call your health care provider if you have any problems or questions after your procedure. °What can I expect after the procedure? °After your procedure, it is typical to have the following: °· Bruising at the radial site that usually fades within 1-2 weeks. °· Blood collecting in the tissue (hematoma) that may be painful to the touch. It should usually decrease in size and tenderness within 1-2 weeks. ° °Follow these instructions at home: °· Take medicines only as directed by your health care provider. °· You may shower 24-48 hours after the procedure or as directed by your health care provider. Remove the bandage (dressing) and gently wash the site with plain soap and water. Pat the area dry with a clean towel. Do not rub the site, because this may cause bleeding. °· Do not take baths, swim, or use a hot tub until your health care provider approves. °· Check your insertion site every day for redness, swelling, or drainage. °· Do not apply powder or lotion to the site. °· Do not flex or bend the affected arm for 24 hours or as directed by your health care provider. °· Do not push or pull heavy objects with the affected arm for 24 hours or as directed by your health care provider. °· Do not lift over 10 lb (4.5 kg) for 5 days after your procedure or as directed by your health care provider. °· Ask your health care provider when it is okay to: °? Return to work or school. °? Resume usual physical activities or sports. °? Resume sexual activity. °· Do not drive home if you are discharged the same day as the procedure. Have someone else drive you. °· You may drive 24 hours after the procedure  unless otherwise instructed by your health care provider. °· Do not operate machinery or power tools for 24 hours after the procedure. °· If your procedure was done as an outpatient procedure, which means that you went home the same day as your procedure, a responsible adult should be with you for the first 24 hours after you arrive home. °· Keep all follow-up visits as directed by your health care provider. This is important. °Contact a health care provider if: °· You have a fever. °· You have chills. °· You have increased bleeding from the radial site. Hold pressure on the site. °Get help right away if: °· You have unusual pain at the radial site. °· You have redness, warmth, or swelling at the radial site. °· You have drainage (other than a small amount of blood on the dressing) from the radial site. °· The radial site is bleeding, and the bleeding does not stop after 30 minutes of holding steady pressure on the site. °· Your arm or hand becomes pale, cool, tingly, or numb. °This information is not intended to replace advice given to you by your health care provider. Make sure you discuss any questions you have with your health care provider. °Document Released: 08/11/2010 Document Revised: 12/15/2015 Document Reviewed: 01/25/2014 °Elsevier Interactive Patient Education © 2018 Elsevier Inc. ° ° ° °Moderate Conscious Sedation, Adult, Care After °These instructions provide you with information about caring for yourself after your procedure. Your health care provider   may also give you more specific instructions. Your treatment has been planned according to current medical practices, but problems sometimes occur. Call your health care provider if you have any problems or questions after your procedure. °What can I expect after the procedure? °After your procedure, it is common: °· To feel sleepy for several hours. °· To feel clumsy and have poor balance for several hours. °· To have poor judgment for several  hours. °· To vomit if you eat too soon. ° °Follow these instructions at home: °For at least 24 hours after the procedure: ° °· Do not: °? Participate in activities where you could fall or become injured. °? Drive. °? Use heavy machinery. °? Drink alcohol. °? Take sleeping pills or medicines that cause drowsiness. °? Make important decisions or sign legal documents. °? Take care of children on your own. °· Rest. °Eating and drinking °· Follow the diet recommended by your health care provider. °· If you vomit: °? Drink water, juice, or soup when you can drink without vomiting. °? Make sure you have little or no nausea before eating solid foods. °General instructions °· Have a responsible adult stay with you until you are awake and alert. °· Take over-the-counter and prescription medicines only as told by your health care provider. °· If you smoke, do not smoke without supervision. °· Keep all follow-up visits as told by your health care provider. This is important. °Contact a health care provider if: °· You keep feeling nauseous or you keep vomiting. °· You feel light-headed. °· You develop a rash. °· You have a fever. °Get help right away if: °· You have trouble breathing. °This information is not intended to replace advice given to you by your health care provider. Make sure you discuss any questions you have with your health care provider. °Document Released: 04/29/2013 Document Revised: 12/12/2015 Document Reviewed: 10/29/2015 °Elsevier Interactive Patient Education © 2018 Elsevier Inc. ° °

## 2018-06-25 ENCOUNTER — Ambulatory Visit (INDEPENDENT_AMBULATORY_CARE_PROVIDER_SITE_OTHER): Payer: Medicare Other | Admitting: Cardiothoracic Surgery

## 2018-06-25 ENCOUNTER — Other Ambulatory Visit: Payer: Self-pay

## 2018-06-25 ENCOUNTER — Other Ambulatory Visit (HOSPITAL_COMMUNITY): Payer: Medicare Other

## 2018-06-25 ENCOUNTER — Encounter: Payer: Self-pay | Admitting: Cardiothoracic Surgery

## 2018-06-25 ENCOUNTER — Ambulatory Visit
Admission: RE | Admit: 2018-06-25 | Discharge: 2018-06-25 | Disposition: A | Discharge status: Home / Self Care | Payer: Medicare Other | Source: Ambulatory Visit | Attending: Cardiothoracic Surgery | Admitting: Cardiothoracic Surgery

## 2018-06-25 VITALS — BP 108/63 | HR 84 | Resp 16 | Ht 66.0 in | Wt 146.5 lb

## 2018-06-25 DIAGNOSIS — Z9689 Presence of other specified functional implants: Secondary | ICD-10-CM | POA: Diagnosis not present

## 2018-06-25 DIAGNOSIS — I509 Heart failure, unspecified: Secondary | ICD-10-CM | POA: Diagnosis not present

## 2018-06-25 DIAGNOSIS — R911 Solitary pulmonary nodule: Secondary | ICD-10-CM

## 2018-06-25 DIAGNOSIS — I2584 Coronary atherosclerosis due to calcified coronary lesion: Secondary | ICD-10-CM

## 2018-06-25 DIAGNOSIS — I2510 Atherosclerotic heart disease of native coronary artery without angina pectoris: Secondary | ICD-10-CM

## 2018-06-25 DIAGNOSIS — J90 Pleural effusion, not elsewhere classified: Secondary | ICD-10-CM | POA: Diagnosis not present

## 2018-06-25 NOTE — Progress Notes (Signed)
PCP is Wenda Low, MD Referring Provider is Lauraine Rinne, NP  Chief Complaint  Patient presents with  . Pleural Effusion    1 month f/u with CXR, draining R PleurX... Monday and Thursday    HPI: Patient returns for scheduled visit with chest x-ray.  Patient had bilateral Pleurx catheters placed for diastolic heart failure earlier this year.  Scan for amyloidosis was negative.  Right and left heart cath performed yesterday-images of coronary angiogram and ventriculogram personally reviewed.  Patient has good systolic function with moderately elevated wedge pressure, normal CVP, no pulmonary hypertension.  The patient is draining the Pleurx catheters twice a week.  Each side is draining approximately 500 cc.  Chest x-ray performed today personally reviewed showing minimal effusion on the left, no significant effusion on the right.  He will continue Monday-Thursday drainage schedule.  When the drainage on 1 side approaches 200 cc he can cut the drainage schedule to once a week [Monday] on the appropriate side.  His Demadex dosing has been increased so he should see a gradual tapering off of the pleural fluid drainage.  Patient denies any significant shortness of breath with the Pleurx catheter management of his pleural effusions.   Past Medical History:  Diagnosis Date  . Actinic keratoses   . Arthritis   . Chronic bilateral pleural effusions   . Colon polyps   . Dyspnea 07/13/2013   BNP normal  . Edema 07/13/2013  . GERD (gastroesophageal reflux disease)    omeprazole 1-2 times daily  . H/O TIA (transient ischemic attack) and stroke    on statins and plavix -saw Dr.Sethi in the past  . Headache   . History of BPH   . History of TIA (transient ischemic attack) 07/13/2013  . Lumbar radiculopathy   . Need for shingles vaccine   . Optic neuritis 10/2015   left eye  . Perennial allergic rhinitis   . Polymyalgia rheumatica ([place]) 07/13/2013   Daily prednisone  . Restless leg syndrome    . Retinal disease    right eye since birth   . Shingles   . Temporal arteritis ([place]) 10/2015  . Wears glasses     Past Surgical History:  Procedure Laterality Date  . BRAVO [place] STUDY N/A 04/18/2016   Procedure: BRAVO [place];  Surgeon: Wilford Corner, MD;  Location: [facility];  Service: Endoscopy;  Laterality: N/A;  . CHEST TUBE INSERTION Right 02/13/2018   Procedure: INSERTION PLEURAL DRAINAGE CATHETER;  Surgeon: Ivin Poot, MD;  Location: [place];  Service: Thoracic;  Laterality: Right;  . COLONOSCOPY    . drropy eyelid surgery    . ESOPHAGOGASTRODUODENOSCOPY (EGD) WITH PROPOFOL N/A 04/18/2016   Procedure: ESOPHAGOGASTRODUODENOSCOPY (EGD) WITH PROPOFOL;  Surgeon: Wilford Corner, MD;  Location: [facility];  Service: Endoscopy;  Laterality: N/A;  . IR THORACENTESIS ASP PLEURAL SPACE W/IMG GUIDE  02/04/2018  . IR THORACENTESIS ASP PLEURAL SPACE W/IMG GUIDE  02/06/2018  . RIGHT/LEFT HEART CATH AND CORONARY ANGIOGRAPHY N/A 06/24/2018   Procedure: RIGHT/LEFT HEART CATH AND CORONARY ANGIOGRAPHY;  Surgeon: Jolaine Artist, MD;  Location: [place] [facility];  Service: Cardiovascular;  Laterality: N/A;  . VASECTOMY      Family History  Problem Relation Age of Onset  . Stroke Mother   . Coronary artery disease Mother   . Diabetes Mother   . Breast cancer Mother   . Heart attack Father   . Coronary artery disease Father   . Diabetes Father   .  Graves' disease Sister     Social History Social History   Tobacco Use  . Smoking status: Former Smoker    Packs/day: 1.00    Years: 15.00    Pack years: 15.00    Types: Cigarettes    Last attempt to quit: 07/23/1975    Years since quitting: 42.9  . Smokeless tobacco: Never Used  Substance Use Topics  . Alcohol use: Yes    Alcohol/week: 7.0 standard drinks    Types: 7 Glasses of wine per week    Comment: 1 glass red wine daily  . Drug use: No    Current Outpatient Medications  Medication Sig Dispense Refill  .  acetaminophen (TYLENOL) 500 MG tablet Take 500 mg by mouth daily as needed for moderate pain or headache.    . calcium carbonate (TUMS - DOSED IN MG ELEMENTAL CALCIUM) 500 MG chewable tablet Chew 1 tablet by mouth daily as needed for indigestion.    . clopidogrel (PLAVIX) 75 MG tablet Take 75 mg by mouth daily.     . diphenhydrAMINE (BENADRYL) 25 MG tablet Take 25 mg by mouth daily as needed for allergies.    Marland Kitchen doxazosin (CARDURA) 8 MG tablet Take 8 mg by mouth daily.    . Multiple Vitamins-Minerals (MULTIVITAMIN ADULT PO) Take 1 tablet by mouth daily.    . Omega-3 Fatty Acids (FISH OIL PO) Take 2 capsules by mouth daily.    Marland Kitchen omeprazole (PRILOSEC) 20 MG capsule Take 1 capsule (20 mg total) by mouth daily.    Vladimir Faster Glycol-Propyl Glycol (SYSTANE OP) Place 1 drop into both eyes 2 (two) times daily as needed (dry eyes).    . potassium chloride SA (K-DUR,KLOR-CON) 20 MEQ tablet TAKE 2 TABLETS(40 MEQ) BY MOUTH DAILY (Patient taking differently: Take 20 mEq by mouth 2 (two) times daily. ) 180 tablet 3  . simvastatin (ZOCOR) 20 MG tablet Take 20 mg by mouth daily.    Marland Kitchen torsemide (DEMADEX) 20 MG tablet Take 2 tablets (40 mg total) by mouth every morning AND 1 tablet (20 mg total) every evening. (Patient taking differently: Take 40 mg by mouth every morning and 20 mg at noon) 90 tablet 6   No current facility-administered medications for this visit.     No Known Allergies  Review of Systems  Weight down to 145 Patient supplementing his diet with protein Leg edema improving BP 108/63 (BP Location: Left Arm, Patient Position: Sitting, Cuff Size: Large)   Pulse 84   Resp 16   Ht 5\' 6"  (1.676 m)   Wt 146 lb 8 oz (66.5 kg)   SpO2 97% Comment: RA  BMI 23.65 kg/m  Physical Exam Alert and comfortable  No JVD  breath sounds clear and equal Heart rhythm regular without murmur 1-2+ pedal edema Neuro intact   Diagnostic Tests: Chest x-ray shows Pleurx catheters in good position with minimal  blunting of left costophrenic angle  Impression: Pleurx catheters providing good control of recurrent bilateral pleural effusions and improved symptoms.  Plan: Continue Monday Thursday drainage schedule of each side.  Return in 1 month with chest x-ray.   Len Childs, MD [facility] [phone]

## 2018-06-26 ENCOUNTER — Encounter (HOSPITAL_COMMUNITY): Payer: Self-pay

## 2018-07-07 ENCOUNTER — Telehealth (HOSPITAL_COMMUNITY): Payer: Self-pay | Admitting: Emergency Medicine

## 2018-07-07 ENCOUNTER — Telehealth: Payer: Self-pay

## 2018-07-07 NOTE — Telephone Encounter (Signed)
RN NAV reaching out to patient to ensure all questions/concerns addressed before tomorrows cardiac MR; pt states he has had one before, knows the drill, denies any new allergies and confirmed ht as 5'6" and weight is 145lb; gave name and callback number should additional concerns arise Marchia Bond RN [phone]

## 2018-07-07 NOTE — Telephone Encounter (Signed)
Patient contacted the office regarding his bilateral PleurX catheters.  He states that the left catheter has not drained anything the last 2 times he has drained.  The right side is draining roughly 300-250 ml's every time.  His current draining schedule is 2 times a week on Monday's and Thursday's.  He wanted to make sure this was normal.    I advised patient that this is a normal process and is to be expected.  Per Dr. Lucianne Lei Trigt's instructions at patient's last post op appointment, patient was advised to drain as current schedule is on Thursday.  On Monday's he would drain both right and left catheters, however on Thursday's he would only drain the right catheter.    Patient acknowledged receipt and told patient to call the office if he had any questions.

## 2018-07-08 ENCOUNTER — Ambulatory Visit (HOSPITAL_COMMUNITY)
Admission: RE | Admit: 2018-07-08 | Discharge: 2018-07-08 | Disposition: A | Discharge status: Home / Self Care | Payer: Medicare Other | Source: Ambulatory Visit | Attending: Internal Medicine | Admitting: Internal Medicine

## 2018-07-08 DIAGNOSIS — J90 Pleural effusion, not elsewhere classified: Secondary | ICD-10-CM | POA: Diagnosis not present

## 2018-07-08 DIAGNOSIS — I509 Heart failure, unspecified: Secondary | ICD-10-CM | POA: Diagnosis not present

## 2018-07-08 DIAGNOSIS — I429 Cardiomyopathy, unspecified: Secondary | ICD-10-CM | POA: Diagnosis not present

## 2018-07-08 MED ORDER — GADOBUTROL 1 MMOL/ML IV SOLN
6.5000 mL | Freq: Once | INTRAVENOUS | Status: AC | PRN
  Administered 2018-07-08: 6.5 mL via INTRAVENOUS

## 2018-07-18 DIAGNOSIS — J918 Pleural effusion in other conditions classified elsewhere: Secondary | ICD-10-CM | POA: Diagnosis not present

## 2018-07-21 ENCOUNTER — Telehealth: Payer: Self-pay

## 2018-07-21 NOTE — Telephone Encounter (Signed)
Patient contacted the office stating that he was having increased drainage from his left PleurX catheter.  He states that he is only draining it once a week, on Thursday's and was getting only 5 ml's.  I advised patient to increase his frequency of drainage back to every other day due to increase in drainage.  Patient acknowledged receipt.  I also advised patient to continue draining his right sided PleurX catheter two times a week since he is still getting 250 ml's every time he drains.  He also stated that the colors varied slightly between right and left catheters.  I advised that this is normal and should not be of concern.  He acknowledged receipt and thanked me for the call.

## 2018-07-29 ENCOUNTER — Other Ambulatory Visit (INDEPENDENT_AMBULATORY_CARE_PROVIDER_SITE_OTHER): Payer: Medicare Other

## 2018-07-29 DIAGNOSIS — D472 Monoclonal gammopathy: Secondary | ICD-10-CM | POA: Diagnosis not present

## 2018-07-29 DIAGNOSIS — D649 Anemia, unspecified: Secondary | ICD-10-CM | POA: Diagnosis not present

## 2018-07-29 LAB — SAVE SMEAR(SSMR), FOR PROVIDER SLIDE REVIEW

## 2018-07-30 ENCOUNTER — Ambulatory Visit (HOSPITAL_COMMUNITY)
Admission: RE | Admit: 2018-07-30 | Discharge: 2018-07-30 | Disposition: A | Discharge status: Home / Self Care | Payer: Medicare Other | Source: Ambulatory Visit | Attending: Internal Medicine | Admitting: Internal Medicine

## 2018-07-30 ENCOUNTER — Encounter (HOSPITAL_COMMUNITY): Payer: Self-pay | Admitting: Internal Medicine

## 2018-07-30 VITALS — BP 102/58 | HR 60 | Wt 155.8 lb

## 2018-07-30 DIAGNOSIS — N400 Enlarged prostate without lower urinary tract symptoms: Secondary | ICD-10-CM | POA: Insufficient documentation

## 2018-07-30 DIAGNOSIS — Z8249 Family history of ischemic heart disease and other diseases of the circulatory system: Secondary | ICD-10-CM | POA: Diagnosis not present

## 2018-07-30 DIAGNOSIS — Z833 Family history of diabetes mellitus: Secondary | ICD-10-CM | POA: Insufficient documentation

## 2018-07-30 DIAGNOSIS — I313 Pericardial effusion (noninflammatory): Secondary | ICD-10-CM | POA: Insufficient documentation

## 2018-07-30 DIAGNOSIS — Z8673 Personal history of transient ischemic attack (TIA), and cerebral infarction without residual deficits: Secondary | ICD-10-CM | POA: Insufficient documentation

## 2018-07-30 DIAGNOSIS — I700 Atherosclerosis of aorta: Secondary | ICD-10-CM | POA: Diagnosis not present

## 2018-07-30 DIAGNOSIS — Z7952 Long term (current) use of systemic steroids: Secondary | ICD-10-CM | POA: Diagnosis not present

## 2018-07-30 DIAGNOSIS — Z79899 Other long term (current) drug therapy: Secondary | ICD-10-CM | POA: Insufficient documentation

## 2018-07-30 DIAGNOSIS — E7800 Pure hypercholesterolemia, unspecified: Secondary | ICD-10-CM | POA: Diagnosis not present

## 2018-07-30 DIAGNOSIS — Z87891 Personal history of nicotine dependence: Secondary | ICD-10-CM | POA: Diagnosis not present

## 2018-07-30 DIAGNOSIS — Z0000 Encounter for general adult medical examination without abnormal findings: Secondary | ICD-10-CM | POA: Diagnosis not present

## 2018-07-30 DIAGNOSIS — G2581 Restless legs syndrome: Secondary | ICD-10-CM | POA: Diagnosis not present

## 2018-07-30 DIAGNOSIS — K219 Gastro-esophageal reflux disease without esophagitis: Secondary | ICD-10-CM | POA: Insufficient documentation

## 2018-07-30 DIAGNOSIS — M353 Polymyalgia rheumatica: Secondary | ICD-10-CM | POA: Diagnosis not present

## 2018-07-30 DIAGNOSIS — G459 Transient cerebral ischemic attack, unspecified: Secondary | ICD-10-CM | POA: Diagnosis not present

## 2018-07-30 DIAGNOSIS — R7309 Other abnormal glucose: Secondary | ICD-10-CM | POA: Diagnosis not present

## 2018-07-30 DIAGNOSIS — M315 Giant cell arteritis with polymyalgia rheumatica: Secondary | ICD-10-CM | POA: Diagnosis not present

## 2018-07-30 DIAGNOSIS — Z7902 Long term (current) use of antithrombotics/antiplatelets: Secondary | ICD-10-CM | POA: Insufficient documentation

## 2018-07-30 DIAGNOSIS — R51 Headache: Secondary | ICD-10-CM | POA: Insufficient documentation

## 2018-07-30 DIAGNOSIS — Z1389 Encounter for screening for other disorder: Secondary | ICD-10-CM | POA: Diagnosis not present

## 2018-07-30 DIAGNOSIS — R600 Localized edema: Secondary | ICD-10-CM | POA: Insufficient documentation

## 2018-07-30 DIAGNOSIS — J90 Pleural effusion, not elsewhere classified: Secondary | ICD-10-CM | POA: Diagnosis not present

## 2018-07-30 DIAGNOSIS — Z823 Family history of stroke: Secondary | ICD-10-CM | POA: Diagnosis not present

## 2018-07-30 DIAGNOSIS — R972 Elevated prostate specific antigen [PSA]: Secondary | ICD-10-CM | POA: Diagnosis not present

## 2018-07-30 DIAGNOSIS — I2510 Atherosclerotic heart disease of native coronary artery without angina pectoris: Secondary | ICD-10-CM | POA: Diagnosis not present

## 2018-07-30 DIAGNOSIS — I2584 Coronary atherosclerosis due to calcified coronary lesion: Secondary | ICD-10-CM

## 2018-07-30 DIAGNOSIS — J309 Allergic rhinitis, unspecified: Secondary | ICD-10-CM | POA: Diagnosis not present

## 2018-07-30 LAB — BASIC METABOLIC PANEL
ANION GAP: 6 (ref 5–15)
BUN: 23 mg/dL (ref 8–23)
CO2: 27 mmol/L (ref 22–32)
Calcium: 8.2 mg/dL — ABNORMAL LOW (ref 8.9–10.3)
Chloride: 106 mmol/L (ref 98–111)
Creatinine, Ser: 1 mg/dL (ref 0.61–1.24)
GFR calc non Af Amer: >60 mL/min (ref >60)
Glucose, Bld: 91 mg/dL (ref 70–99)
Potassium: 3.4 mmol/L — ABNORMAL LOW (ref 3.5–5.1)
Sodium: 139 mmol/L (ref 135–145)

## 2018-07-30 LAB — CBC WITH DIFFERENTIAL/PLATELET
Basophils Absolute: 0.1 10*3/uL (ref 0.0–0.2)
Basos: 1 %
EOS (ABSOLUTE): 0.1 10*3/uL (ref 0.0–0.4)
Eos: 2 %
HEMATOCRIT: 38 % (ref 37.5–51.0)
Hemoglobin: 13.2 g/dL (ref 13.0–17.7)
Immature Grans (Abs): 0 10*3/uL (ref 0.0–0.1)
Immature Granulocytes: 1 %
Lymphocytes Absolute: 1.4 10*3/uL (ref 0.7–3.1)
Lymphs: 24 %
MCH: 33.7 pg — ABNORMAL HIGH (ref 26.6–33.0)
MCHC: 34.7 g/dL (ref 31.5–35.7)
MCV: 97 fL (ref 79–97)
Monocytes Absolute: 0.6 10*3/uL (ref 0.1–0.9)
Monocytes: 11 %
Neutrophils Absolute: 3.5 10*3/uL (ref 1.4–7.0)
Neutrophils: 61 %
Platelets: 295 10*3/uL (ref 150–450)
RBC: 3.92 x10E6/uL — AB (ref 4.14–5.80)
RDW: 12.2 % (ref 11.6–15.4)
WBC: 5.8 10*3/uL (ref 3.4–10.8)

## 2018-07-30 LAB — KAPPA/LAMBDA LIGHT CHAINS
Ig Kappa Free Light Chain: 50 mg/L — ABNORMAL HIGH (ref 3.3–19.4)
Ig Lambda Free Light Chain: 52.2 mg/L — ABNORMAL HIGH (ref 5.7–26.3)
KAPPA/LAMBDA FLC RATIO: 0.96 (ref 0.26–1.65)

## 2018-07-30 MED ORDER — TORSEMIDE 20 MG PO TABS: ORAL_TABLET | ORAL | 1 refills | Status: DC

## 2018-07-30 MED ORDER — POTASSIUM CHLORIDE CRYS ER 20 MEQ PO TBCR: 20.0000 meq | EXTENDED_RELEASE_TABLET | Freq: Two times a day (BID) | ORAL | 1 refills | Status: DC

## 2018-07-30 NOTE — Addendum Note (Signed)
Encounter addended by: Marlise Eves, RN on: 07/30/2018 11:43 AM  Actions taken: Order list changed, Diagnosis association updated, Charge Capture section accepted, Clinical Note Signed

## 2018-07-30 NOTE — Patient Instructions (Signed)
Labs done today  Follow up with Dr. [name] in 4 months

## 2018-07-30 NOTE — Progress Notes (Signed)
[facility]ED [facility]IN[facility] CONSULT NOTE  Referring Physician: Dr. [name] Primary Care: Dr. Roland Earl Primary Cardiologist: Dr. Geraldo Pitter  HPI: [name] is an 85 y/o male with h/o temporal arteritis/polymalgia rheumatica, previous TIA/CVA and recurrent pleural effusions s/p bilateral Pleurex tubes referred by Dr. [name] for evaluation for possible amyloidosis.   He denies h/o known CAD. He had an ETT/Myoview in 7/19 during which he walked 5:30 on [place] Protocol with no CP or ECG changes. Nuclear imaging showed normal EF with fixed inferior defect felt to be diaphragmatic attenuation (similar to study in 2015).  He has not had a cath.   Echo 5/19 showed EF 60-65% with grade I DD. RV is normal with no evidence of PAH. Trivial TR.   He had a CT of the chest in 5/19 which showed 1. Moderately large bilateral pleural effusions  2. Small noncalcified lung nodules with a calcified granuloma in the right middle lobe. These changes most likely are postinflammatory or post infectious in origin. 3. Moderate thoracic aortic atherosclerosis and diffuse coronary artery calcifications. 4. Small pericardial effusion.  Wife says he developed problem with his nails in 2013 as well as chronic sinusitis and edema. Wife is concerned about yellow nail syndrome. Earlier this year he developed bilateral pelural effusions and underwent thoracentesis. Effusions recurred. He was seen by Dr. Geraldo Pitter in Cardiology and Dr. [name] in Pulmonary and then referred to Dr. [name]. He underwent placement of bilateral Pleurex tubes by Dr. [name] 02/13/2018. Fluid analysis c/w transudative process. Cytology has been negative. Serum albumin now 2.7.   Today he returns for routine f/u with his wife. Drainage form Pleurex tubes has been erratic. From right side about 585m 2x/week. On the left side draining 5019mevery other day. Remains on torsemide '60mg'$  daily. Weight stays 145-150. Feels good. No SOB if draining well.  Walking 1 mile per day. Riding stationary bike 2-19m39ms per day. No edema, orthopnea or PND   cardiacMRI: 12/19 LVEF 59% No LGE (no amyloid). RV normal.   Cath 12/19:  Ost LAD lesion is 30% stenosed.   Findings:  Ao = 108/45 (72) LV = 120/12 RA = 4 RV = 31/6 PA = 31/8 (19) PCW = 16 Fick cardiac output/index = 8.2/4.7 PVR = 0.5 WU FA sat = 93% PA sat = 74%, 76% SVC sat = 77%   Serologic w/u: 05/12/2018 SPEP  + M Spikes.  05/23/2018 PYP Scan was not suggestive of TTR 01/2018 ESR 4, CRP 0.3, ANA 1:40, CCP < 16, RF negative    PFT 04/21/14 >> FEV1 2.58 (98%), FEV1% 73, TLC 6.45 (96%), DLCO 74%, no BD    Past Medical History:  Diagnosis Date  . Actinic keratoses   . Arthritis   . Chronic bilateral pleural effusions   . Colon polyps   . Dyspnea 07/13/2013   BNP normal  . Edema 07/13/2013  . GERD (gastroesophageal reflux disease)    omeprazole 1-2 times daily  . H/O TIA (transient ischemic attack) and stroke    on statins and plavix -saw Dr.Sethi in the past  . Headache   . History of BPH   . History of TIA (transient ischemic attack) 07/13/2013  . Lumbar radiculopathy   . Need for shingles vaccine   . Optic neuritis 10/2015   left eye  . Perennial allergic rhinitis   . [place]   Daily prednisone  . Restless leg syndrome   . Retinal disease    right  eye since birth   . Shingles   . Temporal arteritis ([place]) 10/2015  . Wears glasses     Current Outpatient Medications  Medication Sig Dispense Refill  . acetaminophen (TYLENOL) 500 MG tablet Take 500 mg by mouth daily as needed for moderate pain or headache.    . calcium carbonate (TUMS - DOSED IN MG ELEMENTAL CALCIUM) 500 MG chewable tablet Chew 1 tablet by mouth daily as needed for indigestion.    . clopidogrel (PLAVIX) 75 MG tablet Take 75 mg by mouth daily.     Marland Kitchen doxazosin (CARDURA) 8 MG tablet Take 8 mg by mouth daily.    . Multiple Vitamins-Minerals (MULTIVITAMIN ADULT PO)  Take 1 tablet by mouth daily.    . Omega-3 Fatty Acids (FISH OIL PO) Take 2 capsules by mouth daily.    Marland Kitchen omeprazole (PRILOSEC) 20 MG capsule Take 1 capsule (20 mg total) by mouth daily.    Vladimir Faster Glycol-Propyl Glycol (SYSTANE OP) Place 1 drop into both eyes 2 (two) times daily as needed (dry eyes).    . potassium chloride SA (K-DUR,KLOR-CON) 20 MEQ tablet Take 1 tablet (20 mEq total) by mouth 2 (two) times daily. 180 tablet 1  . simvastatin (ZOCOR) 20 MG tablet Take 20 mg by mouth daily.    Marland Kitchen torsemide (DEMADEX) 20 MG tablet Take 2 tablets (40 mg total) by mouth every morning AND 1 tablet (20 mg total) every evening. 270 tablet 1  . diphenhydrAMINE (BENADRYL) 25 MG tablet Take 25 mg by mouth daily as needed for allergies.     No current facility-administered medications for this encounter.     No Known Allergies    Social History   Socioeconomic History  . Marital status: Married    Spouse name: Not on file  . Number of children: 2  . Years of education: Not on file  . Highest education level: Not on file  Occupational History  . Occupation: retired  Scientific laboratory technician  . Financial resource strain: Not on file  . Food insecurity:    Worry: Not on file    Inability: Not on file  . Transportation needs:    Medical: Not on file    Non-medical: Not on file  Tobacco Use  . Smoking status: Former Smoker    Packs/day: 1.00    Years: 15.00    Pack years: 15.00    Types: Cigarettes    Last attempt to quit: 07/23/1975    Years since quitting: 43.0  . Smokeless tobacco: Never Used  Substance and Sexual Activity  . Alcohol use: Yes    Alcohol/week: 7.0 standard drinks    Types: 7 Glasses of wine per week    Comment: 1 glass red wine daily  . Drug use: No  . Sexual activity: Not on file  Lifestyle  . Physical activity:    Days per week: Not on file    Minutes per session: Not on file  . Stress: Not on file  Relationships  . Social connections:    Talks on phone: Not on file     Gets together: Not on file    Attends religious service: Not on file    Active member of club or organization: Not on file    Attends meetings of clubs or organizations: Not on file    Relationship status: Not on file  . Intimate partner violence:    Fear of current or ex partner: Not on file    Emotionally abused: Not  on file    Physically abused: Not on file    Forced sexual activity: Not on file  Other Topics Concern  . Not on file  Social History Narrative  . Not on file      Family History  Problem Relation Age of Onset  . Stroke Mother   . Coronary artery disease Mother   . Diabetes Mother   . Breast cancer Mother   . Heart attack Father   . Coronary artery disease Father   . Diabetes Father   . Graves' disease Sister     Vitals:   07/30/18 1056  BP: (!) 102/58  Pulse: 60  SpO2: 97%  Weight: 70.7 kg (155 lb 12.8 oz)   Wt Readings from Last 3 Encounters:  07/30/18 70.7 kg (155 lb 12.8 oz)  06/25/18 66.5 kg (146 lb 8 oz)  06/24/18 66.2 kg (146 lb)    PHYSICAL EXAM: General:  Well appearing. No resp difficulty HEENT: normal Neck: supple. no JVD. Carotids 2+ bilat; no bruits. No lymphadenopathy or thryomegaly appreciated. Cor: PMI nondisplaced. Regular rate & rhythm. No rubs, gallops or murmurs. Lungs: clear Abdomen: soft, nontender, nondistended. No hepatosplenomegaly. No bruits or masses. Good bowel sounds. Extremities: no cyanosis, clubbing, rash+ compression hose 1+ edema  Diffuse nail atrophy  Neuro: alert & orientedx3, cranial nerves grossly intact. moves all 4 extremities w/o difficulty. Affect pleasant     ASSESSMENT & PLAN:  1. Recurrent transudative bilateral pleural effusions - unclear etiology. He has refractory pleural effusions and severe LE edema without any clear evidence of central volume overload, PAH or elevated intracardiac pressures on echo. (EF normal Grade I DD). BNP only 45 in 5/19 - We have done an exhaustive w/u with no clear  cause. 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 He is to see Dr. Beryle Beams in 2 weeks for possible IVIG. Thias may be our best hope to help clear up his persistent pleural effusions. - s/p bilateral Pleurex tubes by Dr. [name] 02/13/18. Continue as he is doing.  - ESR 4, CRP 0.3, ANA 1:40, CCP < 16, RF negative  (7/19)  - Low volts on ECG. - Myeloma panel with small M-spike with IgG monoclonal with kappa light chains. Likely MGUS - PYP scan and CMRI negative for TTR/amyloid - Discussed limiting fluid intake to < 2 liters per day.  - Continue compression hose.  - Check labs today   2. Coronary calcifications and abnormal stress test - No s/s ischemia.  - Left heart cath as above 12/19 minimal CAD  Glori Bickers, MD  11:26 AM

## 2018-07-31 ENCOUNTER — Telehealth (HOSPITAL_COMMUNITY): Payer: Self-pay

## 2018-07-31 DIAGNOSIS — I509 Heart failure, unspecified: Secondary | ICD-10-CM

## 2018-07-31 MED ORDER — POTASSIUM CHLORIDE CRYS ER 20 MEQ PO TBCR: 40.0000 meq | EXTENDED_RELEASE_TABLET | Freq: Two times a day (BID) | ORAL | 1 refills | Status: DC

## 2018-07-31 NOTE — Telephone Encounter (Signed)
Spoke with pt about low potassium. Pt agreeable to take new dose of Kcl. Scheduled pt's upcoming lab appointment. Pt aware, agreeable and verbalized understanding.

## 2018-08-04 DIAGNOSIS — J918 Pleural effusion in other conditions classified elsewhere: Secondary | ICD-10-CM | POA: Diagnosis not present

## 2018-08-06 ENCOUNTER — Encounter: Payer: Self-pay | Admitting: Cardiothoracic Surgery

## 2018-08-06 ENCOUNTER — Ambulatory Visit (INDEPENDENT_AMBULATORY_CARE_PROVIDER_SITE_OTHER): Payer: Medicare Other | Admitting: Cardiothoracic Surgery

## 2018-08-06 ENCOUNTER — Other Ambulatory Visit: Payer: Self-pay

## 2018-08-06 ENCOUNTER — Ambulatory Visit
Admission: RE | Admit: 2018-08-06 | Discharge: 2018-08-06 | Disposition: A | Discharge status: Home / Self Care | Payer: Medicare Other | Source: Ambulatory Visit | Attending: Cardiothoracic Surgery | Admitting: Cardiothoracic Surgery

## 2018-08-06 VITALS — BP 110/60 | HR 67 | Resp 18 | Ht 66.0 in | Wt 147.0 lb

## 2018-08-06 DIAGNOSIS — Z9689 Presence of other specified functional implants: Secondary | ICD-10-CM

## 2018-08-06 DIAGNOSIS — I2584 Coronary atherosclerosis due to calcified coronary lesion: Secondary | ICD-10-CM

## 2018-08-06 DIAGNOSIS — J90 Pleural effusion, not elsewhere classified: Secondary | ICD-10-CM | POA: Diagnosis not present

## 2018-08-06 DIAGNOSIS — L605 Yellow nail syndrome: Secondary | ICD-10-CM | POA: Diagnosis not present

## 2018-08-06 DIAGNOSIS — I2510 Atherosclerotic heart disease of native coronary artery without angina pectoris: Secondary | ICD-10-CM | POA: Diagnosis not present

## 2018-08-06 NOTE — Progress Notes (Signed)
PCP is Wenda Low, MD Referring Provider is Lauraine Rinne, NP  No chief complaint on file.   HPI: Scheduled office visit with chest x-ray for bilateral Pleurx catheters placed several months ago, summer 2019.  Is felt that the etiology of the recurrent bilateral pleural effusions could be due to the yellow nail syndrome.  The right sided catheter drains twice a week approximately 350 cc each session.  The left Pleurx output has been greater and is drained every other day.  The drainage is variable and ranges from scant to 800 cc.  The nature the fluid has not changed-it remains clear yellow.  There is no cellulitis.  Chest x-ray today shows slight bilateral pleural effusions with catheters both in good position.  Symptomatically the patient shortness of breath is been much improved after placement of the Pleurx catheters.  He remains on oral diuretic directed by Dr. [name].  His weight has been stable the past few months.   Past Medical History:  Diagnosis Date  . Actinic keratoses   . Arthritis   . Chronic bilateral pleural effusions   . Colon polyps   . Dyspnea 07/13/2013   BNP normal  . Edema 07/13/2013  . GERD (gastroesophageal reflux disease)    omeprazole 1-2 times daily  . H/O TIA (transient ischemic attack) and stroke    on statins and plavix -saw Dr.Sethi in the past  . Headache   . History of BPH   . History of TIA (transient ischemic attack) 07/13/2013  . Lumbar radiculopathy   . Need for shingles vaccine   . Optic neuritis 10/2015   left eye  . Perennial allergic rhinitis   . Polymyalgia rheumatica ([place]) 07/13/2013   Daily prednisone  . Restless leg syndrome   . Retinal disease    right eye since birth   . Shingles   . Temporal arteritis ([place]) 10/2015  . Wears glasses     Past Surgical History:  Procedure Laterality Date  . BRAVO [place] STUDY N/A 04/18/2016   Procedure: BRAVO [place];  Surgeon: Wilford Corner, MD;  Location: [facility];  Service:  Endoscopy;  Laterality: N/A;  . CHEST TUBE INSERTION Right 02/13/2018   Procedure: INSERTION PLEURAL DRAINAGE CATHETER;  Surgeon: Ivin Poot, MD;  Location: [facility];  Service: Thoracic;  Laterality: Right;  . COLONOSCOPY    . drropy eyelid surgery    . ESOPHAGOGASTRODUODENOSCOPY (EGD) WITH PROPOFOL N/A 04/18/2016   Procedure: ESOPHAGOGASTRODUODENOSCOPY (EGD) WITH PROPOFOL;  Surgeon: Wilford Corner, MD;  Location: [facility];  Service: Endoscopy;  Laterality: N/A;  . IR THORACENTESIS ASP PLEURAL SPACE W/IMG GUIDE  02/04/2018  . IR THORACENTESIS ASP PLEURAL SPACE W/IMG GUIDE  02/06/2018  . RIGHT/LEFT HEART CATH AND CORONARY ANGIOGRAPHY N/A 06/24/2018   Procedure: RIGHT/LEFT HEART CATH AND CORONARY ANGIOGRAPHY;  Surgeon: Jolaine Artist, MD;  Location: [facility];  Service: Cardiovascular;  Laterality: N/A;  . VASECTOMY      Family History  Problem Relation Age of Onset  . Stroke Mother   . Coronary artery disease Mother   . Diabetes Mother   . Breast cancer Mother   . Heart attack Father   . Coronary artery disease Father   . Diabetes Father   . Graves' disease Sister     Social History Social History   Tobacco Use  . Smoking status: Former Smoker    Packs/day: 1.00    Years: 15.00    Pack years: 15.00    Types: Cigarettes  Last attempt to quit: 07/23/1975    Years since quitting: 43.0  . Smokeless tobacco: Never Used  Substance Use Topics  . Alcohol use: Yes    Alcohol/week: 7.0 standard drinks    Types: 7 Glasses of wine per week    Comment: 1 glass red wine daily  . Drug use: No    Current Outpatient Medications  Medication Sig Dispense Refill  . acetaminophen (TYLENOL) 500 MG tablet Take 500 mg by mouth daily as needed for moderate pain or headache.    . calcium carbonate (TUMS - DOSED IN MG ELEMENTAL CALCIUM) 500 MG chewable tablet Chew 1 tablet by mouth daily as needed for indigestion.    . clopidogrel (PLAVIX) 75 MG tablet Take 75 mg by mouth  daily.     . diphenhydrAMINE (BENADRYL) 25 MG tablet Take 25 mg by mouth daily as needed for allergies.    Marland Kitchen doxazosin (CARDURA) 8 MG tablet Take 8 mg by mouth daily.    . Multiple Vitamins-Minerals (MULTIVITAMIN ADULT PO) Take 1 tablet by mouth daily.    . Omega-3 Fatty Acids (FISH OIL PO) Take 2 capsules by mouth daily.    Marland Kitchen omeprazole (PRILOSEC) 20 MG capsule Take 1 capsule (20 mg total) by mouth daily.    Vladimir Faster Glycol-Propyl Glycol (SYSTANE OP) Place 1 drop into both eyes 2 (two) times daily as needed (dry eyes).    . potassium chloride SA (K-DUR,KLOR-CON) 20 MEQ tablet Take 2 tablets (40 mEq total) by mouth 2 (two) times daily. 180 tablet 1  . simvastatin (ZOCOR) 20 MG tablet Take 20 mg by mouth daily.    Marland Kitchen torsemide (DEMADEX) 20 MG tablet Take 2 tablets (40 mg total) by mouth every morning AND 1 tablet (20 mg total) every evening. 270 tablet 1   No current facility-administered medications for this visit.     No Known Allergies  Review of Systems  No fever No cough No blood in pleural fluid No pain at exit site of the catheters  BP 110/60 (BP Location: Right Arm, Patient Position: Sitting, Cuff Size: Normal)   Pulse 67   Resp 18   Ht 5\' 6"  (1.676 m)   Wt 147 lb (66.7 kg)   SpO2 100% Comment: RA  BMI 23.73 kg/m  Physical Exam Alert and comfortable Chest sounds clear and equal Heart rate regular Pleurx dressing is dry and clean bilaterally Mild ankle edema bilaterally  Diagnostic test  Chest x-ray image today personally reviewed showing small bilateral effusions left greater than right and catheters in good position   Impression: Recurrent bilateral pleural effusions effectively treated with bilateral Pleurx catheters and regular drainage schedules.  Plan: If the right-sided catheter drainage is less than 200 cc consistently then drainage can be reduced to once a week. If the left-sided Pleurx catheter drops less than 300 cc it can be drained twice a week.  I  will continue to see the patient back proximally every 6 weeks with chest x-ray.  If the patient is started on IVIG for yellow nail syndrome then the drainage may start to diminish and drainage schedules will be adjusted with the goal of removing the catheters eventually.  Len Childs, MD [facility] [phone]

## 2018-08-07 ENCOUNTER — Encounter (HOSPITAL_BASED_OUTPATIENT_CLINIC_OR_DEPARTMENT_OTHER): Payer: Self-pay

## 2018-08-07 ENCOUNTER — Emergency Department (HOSPITAL_BASED_OUTPATIENT_CLINIC_OR_DEPARTMENT_OTHER)
Admission: EM | Admit: 2018-08-07 | Discharge: 2018-08-07 | Disposition: A | Discharge status: Home / Self Care | Payer: Medicare Other | Attending: Emergency Medicine | Admitting: Emergency Medicine

## 2018-08-07 ENCOUNTER — Other Ambulatory Visit: Payer: Self-pay

## 2018-08-07 ENCOUNTER — Emergency Department (HOSPITAL_BASED_OUTPATIENT_CLINIC_OR_DEPARTMENT_OTHER): Payer: Medicare Other

## 2018-08-07 DIAGNOSIS — S0990XA Unspecified injury of head, initial encounter: Secondary | ICD-10-CM | POA: Insufficient documentation

## 2018-08-07 DIAGNOSIS — M25551 Pain in right hip: Secondary | ICD-10-CM | POA: Diagnosis not present

## 2018-08-07 DIAGNOSIS — Z79899 Other long term (current) drug therapy: Secondary | ICD-10-CM | POA: Insufficient documentation

## 2018-08-07 DIAGNOSIS — W11XXXA Fall on and from ladder, initial encounter: Secondary | ICD-10-CM | POA: Diagnosis not present

## 2018-08-07 DIAGNOSIS — Y9389 Activity, other specified: Secondary | ICD-10-CM | POA: Diagnosis not present

## 2018-08-07 DIAGNOSIS — S40021A Contusion of right upper arm, initial encounter: Secondary | ICD-10-CM | POA: Diagnosis not present

## 2018-08-07 DIAGNOSIS — Z8673 Personal history of transient ischemic attack (TIA), and cerebral infarction without residual deficits: Secondary | ICD-10-CM | POA: Insufficient documentation

## 2018-08-07 DIAGNOSIS — S0081XA Abrasion of other part of head, initial encounter: Secondary | ICD-10-CM | POA: Diagnosis not present

## 2018-08-07 DIAGNOSIS — Z7902 Long term (current) use of antithrombotics/antiplatelets: Secondary | ICD-10-CM | POA: Insufficient documentation

## 2018-08-07 DIAGNOSIS — Z23 Encounter for immunization: Secondary | ICD-10-CM | POA: Diagnosis not present

## 2018-08-07 DIAGNOSIS — Y998 Other external cause status: Secondary | ICD-10-CM | POA: Insufficient documentation

## 2018-08-07 DIAGNOSIS — M2550 Pain in unspecified joint: Secondary | ICD-10-CM | POA: Diagnosis not present

## 2018-08-07 DIAGNOSIS — M791 Myalgia, unspecified site: Secondary | ICD-10-CM | POA: Diagnosis not present

## 2018-08-07 DIAGNOSIS — Y92017 Garden or yard in single-family (private) house as the place of occurrence of the external cause: Secondary | ICD-10-CM | POA: Diagnosis not present

## 2018-08-07 DIAGNOSIS — S61411A Laceration without foreign body of right hand, initial encounter: Secondary | ICD-10-CM | POA: Diagnosis not present

## 2018-08-07 DIAGNOSIS — Z87891 Personal history of nicotine dependence: Secondary | ICD-10-CM | POA: Insufficient documentation

## 2018-08-07 DIAGNOSIS — S79911A Unspecified injury of right hip, initial encounter: Secondary | ICD-10-CM | POA: Diagnosis not present

## 2018-08-07 DIAGNOSIS — W19XXXA Unspecified fall, initial encounter: Secondary | ICD-10-CM

## 2018-08-07 MED ORDER — TETANUS-DIPHTH-ACELL PERTUSSIS 5-2.5-18.5 LF-MCG/0.5 IM SUSP
0.5000 mL | Freq: Once | INTRAMUSCULAR | Status: AC
  Administered 2018-08-07: 0.5 mL via INTRAMUSCULAR
  Filled 2018-08-07: qty 0.5

## 2018-08-07 MED ORDER — ACETAMINOPHEN 500 MG PO TABS
1000.0000 mg | ORAL_TABLET | Freq: Once | ORAL | Status: DC
  Filled 2018-08-07: qty 2

## 2018-08-07 NOTE — ED Provider Notes (Signed)
[facility] EMERGENCY DEPARTMENT Provider Note   CSN: [id number] Arrival date & time: 08/07/18  1157     History   Chief Complaint Chief Complaint  Patient presents with  . Fall    HPI Stuart FRIEDEN is a 85 y.o. male.  85 yo M with a chief complaint of a fall from ladder.  This occurred just prior to arrival.  The patient was trying to see where his garage roof was leaking from when he lost his balance and fell to the ground.  He is not sure exactly how he fell but is hurting on the right side of his body so expect he fell on that side.  Unsure of loss of consciousness.  Unsure of last tetanus.  Denies ongoing headache confusion or vomiting.  Denies unilateral numbness or weakness.  Has pain to the lateral aspect of the right hip.  Has been able to ambulate on it without difficulty.   Fall  This is a new problem. The current episode started 1 to 2 hours ago. The problem occurs constantly. The problem has not changed since onset.Pertinent negatives include no chest pain, no abdominal pain, no headaches and no shortness of breath. Nothing aggravates the symptoms. Nothing relieves the symptoms. He has tried nothing for the symptoms. The treatment provided no relief.    Past Medical History:  Diagnosis Date  . Actinic keratoses   . Arthritis   . Chronic bilateral pleural effusions   . Colon polyps   . Dyspnea 07/13/2013   BNP normal  . Edema 07/13/2013  . GERD (gastroesophageal reflux disease)    omeprazole 1-2 times daily  . H/O TIA (transient ischemic attack) and stroke    on statins and plavix -saw Dr.Sethi in the past  . Headache   . History of BPH   . History of TIA (transient ischemic attack) 07/13/2013  . Lumbar radiculopathy   . Need for shingles vaccine   . Optic neuritis 10/2015   left eye  . Perennial allergic rhinitis   . Polymyalgia rheumatica ([place]) 07/13/2013   Daily prednisone  . Restless leg syndrome   . Retinal disease    right eye since birth    . Shingles   . Temporal arteritis ([place]) 10/2015  . Wears glasses     Patient Active Problem List   Diagnosis Date Noted  . Abnormal nuclear cardiac imaging test 01/31/2018  . Allergic rhinitis 01/21/2018  . Arthritis, degenerative 01/21/2018  . Benign fibroma of prostate 01/21/2018  . Blood glucose elevated 01/21/2018  . Restless leg 01/21/2018  . Pure hypercholesterolemia 01/21/2018  . Lung nodule, solitary 01/21/2018  . Temporal arteritis ([place]) 01/21/2018  . Temporary cerebral vascular dysfunction 01/21/2018  . Thoracic, thoracolumbar and lumbosacral intervertebral disc disorder 01/21/2018  . Bilateral pleural effusion 12/08/2017  . Pedal edema- chronic 12/08/2017  . Yellow nails 12/08/2017  . Cough in adult 12/07/2017  . Hyponatremia 11/22/2017  . Esophageal reflux 04/18/2016  . Pneumonia 02/22/2016  . Nail fungus 11/09/2015  . Optic neuritis 11/08/2015  . Chronic sinusitis 06/22/2015  . Perirectal abscess 12/25/2013  . Coronary artery calcification 08/18/2013  . Dyspnea 07/13/2013  . History of TIA (transient ischemic attack) 07/13/2013  . Edema 07/13/2013  . Aortic atherosclerosis ([place]) 07/13/2013    Past Surgical History:  Procedure Laterality Date  . BRAVO [place] STUDY N/A 04/18/2016   Procedure: BRAVO [place];  Surgeon: Wilford Corner, MD;  Location: [facility];  Service: Endoscopy;  Laterality: N/A;  .  CHEST TUBE INSERTION Right 02/13/2018   Procedure: INSERTION PLEURAL DRAINAGE CATHETER;  Surgeon: Ivin Poot, MD;  Location: [facility];  Service: Thoracic;  Laterality: Right;  . COLONOSCOPY    . drropy eyelid surgery    . ESOPHAGOGASTRODUODENOSCOPY (EGD) WITH PROPOFOL N/A 04/18/2016   Procedure: ESOPHAGOGASTRODUODENOSCOPY (EGD) WITH PROPOFOL;  Surgeon: Wilford Corner, MD;  Location: [facility];  Service: Endoscopy;  Laterality: N/A;  . IR THORACENTESIS ASP PLEURAL SPACE W/IMG GUIDE  02/04/2018  . IR THORACENTESIS ASP PLEURAL SPACE W/IMG GUIDE  02/06/2018  .  RIGHT/LEFT HEART CATH AND CORONARY ANGIOGRAPHY N/A 06/24/2018   Procedure: RIGHT/LEFT HEART CATH AND CORONARY ANGIOGRAPHY;  Surgeon: Jolaine Artist, MD;  Location: [facility];  Service: Cardiovascular;  Laterality: N/A;  . VASECTOMY          Home Medications    Prior to Admission medications   Medication Sig Start Date End Date Taking? Authorizing Provider  acetaminophen (TYLENOL) 500 MG tablet Take 500 mg by mouth daily as needed for moderate pain or headache.    [provider]  calcium carbonate (TUMS - DOSED IN MG ELEMENTAL CALCIUM) 500 MG chewable tablet Chew 1 tablet by mouth daily as needed for indigestion.    [provider]  clopidogrel (PLAVIX) 75 MG tablet Take 75 mg by mouth daily.     [provider]  diphenhydrAMINE (BENADRYL) 25 MG tablet Take 25 mg by mouth daily as needed for allergies.    [provider]  doxazosin (CARDURA) 8 MG tablet Take 8 mg by mouth daily.    [provider]  Multiple Vitamins-Minerals (MULTIVITAMIN ADULT PO) Take 1 tablet by mouth daily.    [provider]  Omega-3 Fatty Acids (FISH OIL PO) Take 2 capsules by mouth daily.    [provider]  omeprazole (PRILOSEC) 20 MG capsule Take 1 capsule (20 mg total) by mouth daily. 04/18/16   Wilford Corner, MD  Polyethyl Glycol-Propyl Glycol (SYSTANE OP) Place 1 drop into both eyes 2 (two) times daily as needed (dry eyes).    [provider]  potassium chloride SA (K-DUR,KLOR-CON) 20 MEQ tablet Take 2 tablets (40 mEq total) by mouth 2 (two) times daily. 07/31/18   Bensimhon, Shaune Pascal, MD  simvastatin (ZOCOR) 20 MG tablet Take 20 mg by mouth daily.    [provider]  torsemide (DEMADEX) 20 MG tablet Take 2 tablets (40 mg total) by mouth every morning AND 1 tablet (20 mg total) every evening. 07/30/18   Bensimhon, Shaune Pascal, MD    Family History Family History  Problem Relation Age of Onset  . Stroke Mother   .  Coronary artery disease Mother   . Diabetes Mother   . Breast cancer Mother   . Heart attack Father   . Coronary artery disease Father   . Diabetes Father   . Graves' disease Sister     Social History Social History   Tobacco Use  . Smoking status: Former Smoker    Packs/day: 1.00    Years: 15.00    Pack years: 15.00    Types: Cigarettes    Last attempt to quit: 07/23/1975    Years since quitting: 43.0  . Smokeless tobacco: Never Used  Substance Use Topics  . Alcohol use: Yes    Alcohol/week: 7.0 standard drinks    Types: 7 Glasses of wine per week    Comment: 1 glass red wine daily  . Drug use: No  Allergies   Patient has no known allergies.   Review of Systems Review of Systems  Constitutional: Negative for chills and fever.  HENT: Negative for congestion and facial swelling.   Eyes: Negative for discharge and visual disturbance.  Respiratory: Negative for shortness of breath.   Cardiovascular: Negative for chest pain and palpitations.  Gastrointestinal: Negative for abdominal pain, diarrhea and vomiting.  Musculoskeletal: Positive for arthralgias and myalgias.  Skin: Negative for color change and rash.  Neurological: Negative for tremors, syncope and headaches.  Psychiatric/Behavioral: Negative for confusion and dysphoric mood.     Physical Exam Updated Vital Signs BP (!) 105/54 (BP Location: Left Arm)   Pulse 78   Temp 98.2 F (36.8 C) (Oral)   Resp 18   Ht 5\' 6"  (1.676 m)   Wt 68.5 kg   SpO2 98%   BMI 24.37 kg/m   Physical Exam Vitals signs and nursing note reviewed.  Constitutional:      Appearance: He is well-developed.  HENT:     Head: Normocephalic.     Comments: Abrasion to the right frontal region. Eyes:     Pupils: Pupils are equal, round, and reactive to light.  Neck:     Musculoskeletal: Normal range of motion and neck supple.     Vascular: No JVD.  Cardiovascular:     Rate and Rhythm: Normal rate and regular rhythm.      Heart sounds: No murmur. No friction rub. No gallop.   Pulmonary:     Effort: No respiratory distress.     Breath sounds: No wheezing.  Abdominal:     General: There is no distension.     Tenderness: There is no guarding or rebound.  Musculoskeletal: Normal range of motion.        General: Tenderness present.     Comments: Mild tenderness with palpation to the lateral aspect of the hip.  Range of motion of the right wrist without pain.  No pain at the snuffbox.  No midline spinal tenderness, able to rotate his head 45 degrees in either direction without pain.  Skin:    Coloration: Skin is not pale.     Findings: No rash.     Comments: Small skin tear to the dorsal aspect of the right hand   Neurological:     Mental Status: He is alert and oriented to person, place, and time.  Psychiatric:        Behavior: Behavior normal.      ED Treatments / Results  Labs (all labs ordered are listed, but only abnormal results are displayed) Labs Reviewed - No data to display  EKG None  Radiology Dg Chest 2 View  Result Date: 08/06/2018 CLINICAL DATA:  Chronic bilateral pleural effusions. EXAM: CHEST - 2 VIEW COMPARISON:  06/25/2018 FINDINGS: Small chronic bilateral pleural effusions appear slightly diminished. Bilateral chest tubes remain in place. Heart size and vascularity are normal. No bone abnormality. IMPRESSION: Slight decrease in the small bilateral pleural effusions. Electronically Signed   By: Lorriane Shire M.D.   On: 08/06/2018 14:27   Ct Head Wo Contrast  Result Date: 08/07/2018 CLINICAL DATA:  Status post fall this morning with a blow to the head. Initial encounter. EXAM: CT HEAD WITHOUT CONTRAST TECHNIQUE: Contiguous axial images were obtained from the base of the skull through the vertex without intravenous contrast. COMPARISON:  None. FINDINGS: Brain: No evidence of acute infarction, hemorrhage, hydrocephalus, extra-axial collection or mass lesion/mass effect. There is some  cortical atrophy and  chronic microvascular ischemic change. Vascular: No hyperdense vessel or unexpected calcification. Skull: Intact. Sinuses/Orbits: Scattered ethmoid air cell disease is identified. Visualized left maxillary sinus is completely opacified with wall thickening present. There is some mucosal thickening in the right maxillary sinus. Orbits are unremarkable. Other: None. IMPRESSION: No acute abnormality. Cortical atrophy and chronic microvascular ischemic change. Sinus disease. Electronically Signed   By: Inge Rise M.D.   On: 08/07/2018 12:47   Dg Hip Unilat W Or Wo Pelvis 2-3 Views Right  Result Date: 08/07/2018 CLINICAL DATA:  [place] from a ladder today, 5 feet. Right lateral hip pain. EXAM: DG HIP (WITH OR WITHOUT PELVIS) 2-3V RIGHT COMPARISON:  None. FINDINGS: There is no evidence of hip fracture or dislocation. There is no evidence of arthropathy or other focal bone abnormality. IMPRESSION: Negative. Electronically Signed   By: Nelson Chimes M.D.   On: 08/07/2018 12:47    Procedures Procedures (including critical care time)  Medications Ordered in ED Medications  acetaminophen (TYLENOL) tablet 1,000 mg (1,000 mg Oral Refused 08/07/18 1303)  Tdap (BOOSTRIX) injection 0.5 mL (0.5 mLs Intramuscular Given 08/07/18 1255)     Initial Impression / Assessment and Plan / ED Course  I have reviewed the triage vital signs and the nursing notes.  Pertinent labs & imaging results that were available during my care of the patient were reviewed by me and considered in my medical decision making (see chart for details).     85 yo M with a chief complaint of fall from a ladder.  Having just prior to arrival.  Struck his head in the right side of his body.  Complaining mostly of pain to the right hip.  Will CT the head.  No C-spine tenderness or neck pain.  Ct negative, plain film of the hip is negative for fx as viewed by me.  Discharge home.  1:08 PM:  I have discussed the  diagnosis/risks/treatment options with the patient and family and believe the pt to be eligible for discharge home to follow-up with PCP. We also discussed returning to the ED immediately if new or worsening sx occur. We discussed the sx which are most concerning (e.g., sudden worsening pain, fever, inability to tolerate by mouth) that necessitate immediate return. Medications administered to the patient during their visit and any new prescriptions provided to the patient are listed below.  Medications given during this visit Medications  acetaminophen (TYLENOL) tablet 1,000 mg (1,000 mg Oral Refused 08/07/18 1303)  Tdap (BOOSTRIX) injection 0.5 mL (0.5 mLs Intramuscular Given 08/07/18 1255)     The patient appears reasonably screen and/or stabilized for discharge and I doubt any other medical condition or other [facility] requiring further screening, evaluation, or treatment in the ED at this time prior to discharge.    Final Clinical Impressions(s) / ED Diagnoses   Final diagnoses:  Fall, initial encounter    ED Discharge Orders    None       Deno Etienne, DO 08/07/18 1308

## 2018-08-07 NOTE — Discharge Instructions (Signed)
Follow up with your PCP.  Return for confusion, vomiting.

## 2018-08-07 NOTE — ED Triage Notes (Signed)
Pt states he fell ~1 hour PTA-fell off ladder ~4-5 ft up-fell on concrete-pain to right hip, LE and right forehead-no LOC-denies pain to neck-NAD-steady gait

## 2018-08-11 ENCOUNTER — Telehealth: Payer: Self-pay

## 2018-08-11 NOTE — Telephone Encounter (Signed)
Stuart Watson contacted the office concerned about his drainage from his [place] when drained today, which 400 ml's were drained.  He stated that he had a fall over the weekend which prompted him to go to the emergency room.  Everything checked out ok but when he drained his PleurX on his right side, he noticed red-tinged drainage with blood in the tubing.  Fell on the right side of his body off the ladder.  Per Dr. [name], patient is to hold his Plavix x2 days and restart it on Thursday.  Also, patient is to drain his PleurX routinely on Thursday from the right side and advised patient to give the office a call back to let us know how the drainage is.  Patient acknowledged receipt.

## 2018-08-12 ENCOUNTER — Ambulatory Visit (HOSPITAL_COMMUNITY)
Admission: RE | Admit: 2018-08-12 | Discharge: 2018-08-12 | Disposition: A | Discharge status: Home / Self Care | Payer: Medicare Other | Source: Ambulatory Visit | Attending: Cardiology | Admitting: Cardiology

## 2018-08-12 ENCOUNTER — Encounter: Payer: Self-pay | Admitting: Oncology

## 2018-08-12 ENCOUNTER — Ambulatory Visit (INDEPENDENT_AMBULATORY_CARE_PROVIDER_SITE_OTHER): Payer: Medicare Other | Admitting: Oncology

## 2018-08-12 ENCOUNTER — Other Ambulatory Visit: Payer: Self-pay

## 2018-08-12 VITALS — BP 117/41 | HR 66 | Temp 97.7°F | Ht 66.0 in | Wt 152.1 lb

## 2018-08-12 DIAGNOSIS — J90 Pleural effusion, not elsewhere classified: Secondary | ICD-10-CM | POA: Diagnosis not present

## 2018-08-12 DIAGNOSIS — L605 Yellow nail syndrome: Secondary | ICD-10-CM | POA: Diagnosis not present

## 2018-08-12 DIAGNOSIS — Z87891 Personal history of nicotine dependence: Secondary | ICD-10-CM

## 2018-08-12 DIAGNOSIS — I509 Heart failure, unspecified: Secondary | ICD-10-CM | POA: Diagnosis not present

## 2018-08-12 DIAGNOSIS — Z7902 Long term (current) use of antithrombotics/antiplatelets: Secondary | ICD-10-CM

## 2018-08-12 DIAGNOSIS — J329 Chronic sinusitis, unspecified: Secondary | ICD-10-CM

## 2018-08-12 HISTORY — DX: Yellow nail syndrome: L60.5

## 2018-08-12 LAB — BASIC METABOLIC PANEL
Anion gap: 7 (ref 5–15)
BUN: 27 mg/dL — ABNORMAL HIGH (ref 8–23)
CALCIUM: 8 mg/dL — AB (ref 8.9–10.3)
CO2: 27 mmol/L (ref 22–32)
Chloride: 108 mmol/L (ref 98–111)
Creatinine, Ser: 1.12 mg/dL (ref 0.61–1.24)
GFR calc Af Amer: >60 mL/min (ref >60)
GFR calc non Af Amer: 60 mL/min — ABNORMAL LOW (ref >60)
Glucose, Bld: 108 mg/dL — ABNORMAL HIGH (ref 70–99)
Potassium: 3.9 mmol/L (ref 3.5–5.1)
Sodium: 142 mmol/L (ref 135–145)

## 2018-08-12 NOTE — Patient Instructions (Signed)
No need to follow up with Hematology  It was a pleasure meeting you!

## 2018-08-13 ENCOUNTER — Other Ambulatory Visit (HOSPITAL_COMMUNITY): Payer: Medicare Other

## 2018-08-13 NOTE — Progress Notes (Signed)
New Patient Hematology   Stuart Watson [id number] [birth year] 85 y.o. 08/13/2018  CC: Dr. Pierre Bali; Dr. Benita Stabile   Reason for referral:  Assess whether any underlying hematologic disorder to explain the "yellow nail syndrome"   HPI:  Pleasant 85 year old man who is been in overall excellent health without any major medical or surgical illness until about 6 years ago when he began to notice changes in his fingernails and indolent onset of sinusitis and ankle swelling.  3 years ago he developed what was initially felt to be possible temporal arteritis with acute decrease in vision in his left eye.  Per his history temporal artery biopsies were negative but he did have a partial response to steroids.  He was subsequently told this was probably a stroke.  In April 2019 he started to develop respiratory symptoms and was found to have bilateral pleural effusions.  He has had multiple thoracentesis procedures and ultimately had to have bilateral chronic pleural catheter drains placed.  The fluid has been analyzed on multiple occasions and appears to be a transudate.  Flow cytometry done on January 07, 2018 showed no abnormal B or T-cell populations.  There was a predominance of benign T lymphocytes. Additional symptoms include muscle cramps of his hands feet and legs.  He denied paresthesias but other examiners have reported that he has neuropathy.  On my exam today which will be detailed below, he does have moderate decrease in vibration sense over the fingertips by tuning fork exam. 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  No organic solvent exposure.  No therapeutic radiation exposure.  He smoked on and off until 1976 when he stopped completely. There is a family history of early cardiac disease in his father who died at age 3 of an MI.  Mother lived until 56.  He has 2 half-sisters who  are healthy.  2 sons who are healthy.  No family history of collagen vascular disorder or any condition remotely similar to his.  In anticipation of today's visit, he was screened for multiple myeloma.  He has normal total quantitative immunoglobulins IgG 1195 mg percent, IgA 126, IgM 23.  There is monoclonal IgG kappa paraprotein detectable on immunofixation electrophoresis of the serum but serum kappa and lambda free light chain analysis is normal with a normal ratio 0.96. Chemistry profile remarkable for decreased total protein and albumin. CBC remarkable only for a mild anemia with hemoglobin 13, MCV 97.  Normal renal function with BUN 23, creatinine 1.0. Previous rheumatologic work-up unrevealing with ESR 4 mm, CRP 0.3, ANA week positive at 1: 40, rheumatoid factor negative, CCP undetectable.  Normal liver functions other than the decreased protein and albumin.  He denies any constitutional symptoms at present.  Appetite remains good.  He is not losing weight.  No fevers.  He has had a dry mouth from diuretics use to control peripheral edema. He is not getting any headaches, visual changes, he denies any dysphagia, no dyspnea at rest.  He is draining the left hemithorax twice a week and the right hemithorax every other day.  No chest pain, pressure, or palpitations.  No abdominal pain.  He has been getting left lower quadrant/inguinal discomfort over the last week or so. He had an accidental fall off a ladder last week and struck most of his right side which is still hurting.  Prior to this, no polyarthralgia  or polymyalgia.  Skin is dry but no rash.  He denied paresthesias to me.  PMH: Past Medical History:  Diagnosis Date  . Actinic keratoses   . Arthritis   . Chronic bilateral pleural effusions   . Colon polyps   . Dyspnea 07/13/2013   BNP normal  . Edema 07/13/2013  . GERD (gastroesophageal reflux disease)    omeprazole 1-2 times daily  . H/O TIA (transient ischemic attack) and stroke     on statins and plavix -saw Dr.Sethi in the past  . Headache   . History of BPH   . History of TIA (transient ischemic attack) 07/13/2013  . Lumbar radiculopathy   . Need for shingles vaccine   . Optic neuritis 10/2015   left eye  . Perennial allergic rhinitis   . Polymyalgia rheumatica ([place]) 07/13/2013   Daily prednisone  . Restless leg syndrome   . Retinal disease    right eye since birth   . Shingles   . Temporal arteritis ([place]) 10/2015  . Wears glasses   . 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 BRAVO [place] STUDY N/A 04/18/2016   Procedure: BRAVO [place];  Surgeon: Wilford Corner, MD;  [facility];  Service: Endoscopy;  Laterality: N/A;  . CHEST TUBE INSERTION Right 02/13/2018   Procedure: INSERTION PLEURAL DRAINAGE CATHETER;  Surgeon: Ivin Poot, MD;  Location: [facility];  Service: Thoracic;  Laterality: Right;  . COLONOSCOPY    . drropy eyelid surgery    . ESOPHAGOGASTRODUODENOSCOPY (EGD) WITH PROPOFOL N/A 04/18/2016   Procedure: ESOPHAGOGASTRODUODENOSCOPY (EGD) WITH PROPOFOL;  Surgeon: Wilford Corner, MD;  [facility];  Service: Endoscopy;  Laterality: N/A;  . IR THORACENTESIS ASP PLEURAL SPACE W/IMG GUIDE  02/04/2018  . IR THORACENTESIS ASP PLEURAL SPACE W/IMG GUIDE  02/06/2018  . RIGHT/LEFT HEART CATH AND CORONARY ANGIOGRAPHY N/A 06/24/2018   Procedure: RIGHT/LEFT HEART CATH AND CORONARY ANGIOGRAPHY;  Surgeon: Jolaine Artist, MD;  Location: [facility];  Service: Cardiovascular;  Laterality: N/A;  . VASECTOMY      Allergies: No Known Allergies  Medications:  Current Outpatient Medications:  .  acetaminophen (TYLENOL) 500 MG tablet, Take 500 mg by mouth daily as needed for moderate pain or headache., Disp: , Rfl:  .  calcium carbonate (TUMS - DOSED IN MG ELEMENTAL CALCIUM) 500 MG chewable tablet, Chew 1 tablet by mouth daily as needed for indigestion., Disp: , Rfl:  .  clopidogrel  (PLAVIX) 75 MG tablet, Take 75 mg by mouth daily. , Disp: , Rfl:  .  diphenhydrAMINE (BENADRYL) 25 MG tablet, Take 25 mg by mouth daily as needed for allergies., Disp: , Rfl:  .  doxazosin (CARDURA) 8 MG tablet, Take 8 mg by mouth daily., Disp: , Rfl:  .  Multiple Vitamins-Minerals (MULTIVITAMIN ADULT PO), Take 1 tablet by mouth daily., Disp: , Rfl:  .  Omega-3 Fatty Acids (FISH OIL PO), Take 2 capsules by mouth daily., Disp: , Rfl:  .  omeprazole (PRILOSEC) 20 MG capsule, Take 1 capsule (20 mg total) by mouth daily., Disp: , Rfl:  .  Polyethyl Glycol-Propyl Glycol (SYSTANE OP), Place 1 drop into both eyes 2 (two) times daily as needed (dry eyes)., Disp: , Rfl:  .  potassium chloride SA (K-DUR,KLOR-CON) 20 MEQ tablet, Take 2 tablets (40 mEq total) by mouth 2 (two) times daily., Disp: 180 tablet, Rfl: 1 .  simvastatin (ZOCOR) 20 MG tablet, Take 20 mg  by mouth daily., Disp: , Rfl:  .  torsemide (DEMADEX) 20 MG tablet, Take 2 tablets (40 mg total) by mouth every morning AND 1 tablet (20 mg total) every evening., Disp: 270 tablet, Rfl: 1   Social History: Worked for [employer] for many years.  Married x2.  Current wife accompanies him today.  2 sons from his first wife who are healthy.  Stepdaughters from his current wife.  reports that he quit smoking about 43 years ago.Marland Kitchen  He reports current alcohol use of about 7.0 standard drinks of alcohol per week.  Usually a glass of wine with meals.  An occasional beer.  He reports that he does not use drugs.  As stated above, no exposure to organic chemicals or radiation.  Minimal exposure to asbestos dust and lead batteries.  Family History: Family History  Problem Relation Age of Onset  . Stroke Mother   . Coronary artery disease Mother   . Diabetes Mother   . Breast cancer Mother   . Heart attack Father   . Coronary artery disease Father   . Diabetes Father   . Graves' disease Sister     Review of Systems: See interim history Remaining ROS  negative.  Physical Exam: Blood pressure (!) 117/41, pulse 66, temperature 97.7 F (36.5 C), temperature source Oral, height '5\' 6"'$  (1.676 m), weight 152 lb 1.6 oz (69 kg), SpO2 99 %. Wt Readings from Last 3 Encounters:  08/12/18 152 lb 1.6 oz (69 kg)  08/07/18 151 lb (68.5 kg)  08/06/18 147 lb (66.7 kg)     General appearance: Thin Caucasian man in no distress HENNT: Pharynx no erythema, exudate, mass, or ulcer. No thyromegaly or thyroid nodules Lymph nodes: No cervical, supraclavicular, or axillary lymphadenopathy Breasts:  Lungs: Clear to auscultation, resonant to percussion throughout.  Bilateral chest tubes. Heart: Regular rhythm, no murmur, no gallop, no rub, no click, no edema Abdomen: Soft, nontender, normal bowel sounds, no mass, no organomegaly Extremities: 1+ pedal edema, no calf tenderness.  No cyanotic changes.  No Raynaud's. Musculoskeletal: no joint deformities GU:  Vascular: Carotid pulses 2+, no bruits, distal pulses: Dorsalis pedis 1+ on the right, nonpalpable on the left.  Posterior tibial pulse 1+ on the left, not palpable on the right. Neurologic: Alert, oriented, PERRLA, optic discs sharp on the right, poorly visualized on the left,  vessels normal, no hemorrhage or exudate, cranial nerves grossly normal, motor strength 5 over 5, reflexes 1+ symmetric, upper body coordination normal, gait normal, vibration sensation moderately decreased by tuning fork exam over the fingertips. Skin: No rash or ecchymosis.  Skin on the palms of the hands is dry.  No palmar erythema. Nails: Grayish-yellow in color.  Uneven growth.    Lab Results: Lab Results  Component Value Date   WBC 5.8 07/29/2018   HGB 13.2 07/29/2018   HCT 38.0 07/29/2018   MCV 97 07/29/2018   PLT 295 07/29/2018     Chemistry      Component Value Date/Time   NA 142 08/12/2018 1437   NA 136 01/31/2018 1425   K 3.9 08/12/2018 1437   CL 108 08/12/2018 1437   CO2 27 08/12/2018 1437   BUN 27 (H)  08/12/2018 1437   BUN 16 01/31/2018 1425   CREATININE 1.12 08/12/2018 1437      Component Value Date/Time   CALCIUM 8.0 (L) 08/12/2018 1437   ALKPHOS 38 05/19/2018 1116   AST 26 05/19/2018 1116   ALT 13 05/19/2018 1116   BILITOT  0.6 05/19/2018 1116       Pathology: See discussion above   Review of peripheral blood film: Normochromic normocytic red cells.  No spherocytes, schistocytes, or inclusions.  Neutrophils normal and lobation and granulation.  Lymphocytes benign.  Single eosinophil and single basophil seen.  Platelets normal in morphology and number.   Radiological Studies: No results found.    Impression: Yellow nail syndrome remains idiopathic.  Largest series of 41 consecutive patients reported by the [facility] in the genital chest in 2008 volume 134 page 375 by Enid Baas al emphasizes the lack of precise etiology of this symptom complex which can include slow growing nails, lymphedema, and respiratory symptoms.  Typically sinusitis, recurrent pneumonia, and chronic cough with a significant number of patients also developing pleural effusions such as this gentleman.  No specific malignant or autoimmune process has been definitely linked to this syndrome.  Majority of experts feel that this is related to lymphatic dysfunction.  Some patients have hypoproteinemia such as this man.  Most to have normal IgG studies and no clinical or laboratory evidence of immunosuppression such as this patient.  There does not appear to be a hereditary predisposition.  Of the myriad of potential diseases that have been anecdotally associated with this syndrome, there are no reports of associations with amyloidosis. This man has a trace amount of monoclonal protein on immunofixation of the serum with normal total quantitative immunoglobulins, normal serum kappa and lambda free light chains, no proteinuria or monoclonal protein on a 24-hour urine collection done through our office. Trace  monoclonal protein in serum or urine is normal in over 5% of people over the age of 16 and probably closer to 10% in people over 29.  I would definitively say this is unrelated to his yellow nail syndrome and that there is no evidence that he has amyloidosis.    Recommendation: Please see discussion above. The best I could do was empathized with the patient and his wife.  Unless and until we uncover the main precipitating factor for this disorder, treatment remains symptomatic. There are anecdotal reports of using vitamin E primarily to reverse the nail changes.  This is benign and I suggested that he take a vitamin E supplement. From the oncology point of view, the approach to chronic pleural effusions has fluctuated over time.  I have seen the best effects with talc pleurodesis as a more definitive solution and although not 100% effective, which can be said of almost anything, it avoids the inconvenience of long-term pleural drainage catheters with their attendant risks for infection, occlusion, and inadvertent removal.  This option was discussed with him by the cardiovascular surgeon.  I have not scheduled a formal follow-up visit since I do not feel that there is an underlying hematologic condition.    Murriel Hopper, MD, [place]  Hematology-Oncology/Internal Medicine  08/13/2018, 11:10 AM

## 2018-08-19 DIAGNOSIS — M25551 Pain in right hip: Secondary | ICD-10-CM | POA: Diagnosis not present

## 2018-09-12 DIAGNOSIS — J918 Pleural effusion in other conditions classified elsewhere: Secondary | ICD-10-CM | POA: Diagnosis not present

## 2018-09-18 DIAGNOSIS — K4090 Unilateral inguinal hernia, without obstruction or gangrene, not specified as recurrent: Secondary | ICD-10-CM | POA: Diagnosis not present

## 2018-09-18 DIAGNOSIS — K219 Gastro-esophageal reflux disease without esophagitis: Secondary | ICD-10-CM | POA: Diagnosis not present

## 2018-10-07 ENCOUNTER — Other Ambulatory Visit: Payer: Self-pay

## 2018-10-07 ENCOUNTER — Other Ambulatory Visit: Payer: Self-pay | Admitting: Cardiothoracic Surgery

## 2018-10-07 DIAGNOSIS — J90 Pleural effusion, not elsewhere classified: Secondary | ICD-10-CM

## 2018-10-07 DIAGNOSIS — R911 Solitary pulmonary nodule: Secondary | ICD-10-CM

## 2018-10-08 ENCOUNTER — Ambulatory Visit: Payer: Medicare Other | Admitting: Cardiothoracic Surgery

## 2018-10-09 DIAGNOSIS — J918 Pleural effusion in other conditions classified elsewhere: Secondary | ICD-10-CM | POA: Diagnosis not present

## 2018-10-29 ENCOUNTER — Other Ambulatory Visit: Payer: Self-pay | Admitting: Cardiothoracic Surgery

## 2018-10-29 ENCOUNTER — Ambulatory Visit: Payer: Medicare Other | Admitting: Cardiothoracic Surgery

## 2018-10-29 ENCOUNTER — Telehealth: Payer: Self-pay | Admitting: Cardiothoracic Surgery

## 2018-10-29 NOTE — Telephone Encounter (Signed)
  I am in the  [facility] office and attempted a patient visit by telephone with Stuart Watson on 4/8 2020 at 12:35 PM  The reason for the phone call was to follow-up his bilateral Pleurx catheter function and drainage and to address any issues.  Patient has a diagnosis of chronic recurrent benign bilateral pleural effusions of unknown etiology.  He is at age 85 a high risk to acquire  the viral infection.  I left a voicemail message with the patient that I would return the call.  We will try to have him present for a normal office visit with chest x-ray after the ban on unnecessary travel is lifted.

## 2018-11-05 ENCOUNTER — Telehealth (HOSPITAL_COMMUNITY): Payer: Self-pay | Admitting: *Deleted

## 2018-11-05 NOTE — Telephone Encounter (Signed)
Patients wife called stating patient " passed out" this morning while standing in the kitchen pouring a glass of water. She said she does not think he went completely unconscious and that he did not fall or injure himself. Pt felt "fine" afterward but during episode his lips turned white. Per Caryl Pina report to emergency room if this happens again otherwise hold todays PM torsemide and have telehealth visit tomorrow with her. Pts wife aware and agreeable with plan.

## 2018-11-06 ENCOUNTER — Ambulatory Visit (HOSPITAL_COMMUNITY)
Admission: RE | Admit: 2018-11-06 | Discharge: 2018-11-06 | Disposition: A | Discharge status: Home / Self Care | Payer: Medicare Other | Source: Ambulatory Visit | Attending: Cardiology | Admitting: Cardiology

## 2018-11-06 ENCOUNTER — Other Ambulatory Visit: Payer: Self-pay

## 2018-11-06 ENCOUNTER — Ambulatory Visit (HOSPITAL_COMMUNITY)
Admission: RE | Admit: 2018-11-06 | Discharge: 2018-11-06 | Disposition: A | Discharge status: Home / Self Care | Payer: Medicare Other | Source: Ambulatory Visit | Attending: Internal Medicine | Admitting: Internal Medicine

## 2018-11-06 VITALS — BP 120/74 | HR 84 | Wt 148.2 lb

## 2018-11-06 DIAGNOSIS — Z87891 Personal history of nicotine dependence: Secondary | ICD-10-CM | POA: Diagnosis not present

## 2018-11-06 DIAGNOSIS — Z7902 Long term (current) use of antithrombotics/antiplatelets: Secondary | ICD-10-CM | POA: Insufficient documentation

## 2018-11-06 DIAGNOSIS — W19XXXA Unspecified fall, initial encounter: Secondary | ICD-10-CM | POA: Diagnosis not present

## 2018-11-06 DIAGNOSIS — J90 Pleural effusion, not elsewhere classified: Secondary | ICD-10-CM | POA: Diagnosis not present

## 2018-11-06 DIAGNOSIS — M315 Giant cell arteritis with polymyalgia rheumatica: Secondary | ICD-10-CM | POA: Diagnosis not present

## 2018-11-06 DIAGNOSIS — Z803 Family history of malignant neoplasm of breast: Secondary | ICD-10-CM | POA: Diagnosis not present

## 2018-11-06 DIAGNOSIS — K219 Gastro-esophageal reflux disease without esophagitis: Secondary | ICD-10-CM | POA: Insufficient documentation

## 2018-11-06 DIAGNOSIS — N400 Enlarged prostate without lower urinary tract symptoms: Secondary | ICD-10-CM | POA: Insufficient documentation

## 2018-11-06 DIAGNOSIS — I498 Other specified cardiac arrhythmias: Secondary | ICD-10-CM | POA: Diagnosis not present

## 2018-11-06 DIAGNOSIS — Z79899 Other long term (current) drug therapy: Secondary | ICD-10-CM | POA: Diagnosis not present

## 2018-11-06 DIAGNOSIS — M1990 Unspecified osteoarthritis, unspecified site: Secondary | ICD-10-CM | POA: Diagnosis not present

## 2018-11-06 DIAGNOSIS — Z833 Family history of diabetes mellitus: Secondary | ICD-10-CM | POA: Insufficient documentation

## 2018-11-06 DIAGNOSIS — R55 Syncope and collapse: Secondary | ICD-10-CM

## 2018-11-06 DIAGNOSIS — W11XXXA Fall on and from ladder, initial encounter: Secondary | ICD-10-CM | POA: Diagnosis not present

## 2018-11-06 LAB — BASIC METABOLIC PANEL
Anion gap: 9 (ref 5–15)
BUN: 17 mg/dL (ref 8–23)
CO2: 28 mmol/L (ref 22–32)
Calcium: 8.3 mg/dL — ABNORMAL LOW (ref 8.9–10.3)
Chloride: 102 mmol/L (ref 98–111)
Creatinine, Ser: 0.99 mg/dL (ref 0.61–1.24)
GFR calc Af Amer: >60 mL/min (ref >60)
GFR calc non Af Amer: >60 mL/min (ref >60)
Glucose, Bld: 102 mg/dL — ABNORMAL HIGH (ref 70–99)
Potassium: 4 mmol/L (ref 3.5–5.1)
Sodium: 139 mmol/L (ref 135–145)

## 2018-11-06 LAB — CBC
HCT: 39.8 % (ref 39.0–52.0)
Hemoglobin: 13.1 g/dL (ref 13.0–17.0)
MCH: 32.2 pg (ref 26.0–34.0)
MCHC: 32.9 g/dL (ref 30.0–36.0)
MCV: 97.8 fL (ref 80.0–100.0)
Platelets: 315 10*3/uL (ref 150–400)
RBC: 4.07 MIL/uL — ABNORMAL LOW (ref 4.22–5.81)
RDW: 14.2 % (ref 11.5–15.5)
WBC: 6.8 10*3/uL (ref 4.0–10.5)
nRBC: 0 % (ref 0.0–0.2)

## 2018-11-06 LAB — MAGNESIUM: Magnesium: 2.2 mg/dL (ref 1.7–2.4)

## 2018-11-06 NOTE — Addendum Note (Signed)
Encounter addended by: Georgiana Shore, NP on: 11/06/2018 11:56 AM  Actions taken: Clinical Note Signed

## 2018-11-06 NOTE — Patient Instructions (Addendum)
Lab work will need to be done today. We will notify you of any abnormal lab work.  EKG done today.  Please keep follow up appointment with Dr. [name]. This will be a virtual visit May 8th at 1:40pm.

## 2018-11-06 NOTE — Progress Notes (Signed)
Patient presents to office for nurse visit after near-syncopal episode  Per Caryl Pina Smith,NP/ Dr. [name] Labs, EKG and XioXT x 14 days

## 2018-11-06 NOTE — Addendum Note (Signed)
Encounter addended by: Marlise Eves, RN on: 11/06/2018 10:41 AM  Actions taken: Order list changed, Diagnosis association updated, Clinical Note Signed

## 2018-11-06 NOTE — Progress Notes (Signed)
Spoke to patient about the need for lab work and an EKG today. Pt is agreeable and made the appointment. avs to be printed and given at lab appointment.

## 2018-11-06 NOTE — Progress Notes (Addendum)
Heart Failure TeleHealth Note  Due to national recommendations of social distancing due to [facility] 19, Audio/video telehealth visit is felt to be most appropriate for this patient at this time.  See MyChart message from today for patient consent regarding telehealth for [name].  Date:  11/06/2018   [name], DOB 22-Jan-1932, MRN [id number]  Location: Home  Provider location: [facility] Type of Visit: Established patient, Add on  PCP:  Stuart Low, MD  Primary HF: Stuart Watson  Chief Complaint: Fall   History of Present Illness: Stuart Watson is an 85 y/o male with h/o temporal arteritis/polymalgia rheumatica, previous TIA/CVA, and recurrent pleural effusions s/p bilateral Pleurex tubes.  He was last seen in HF clinic 07/30/2018. He was doing fine. K was Watson and he was started on daily supp. Recheck K 2/21 was stable 3.9.   He had a fall off a ladder on 08/07/18. He fell off 5 foot ladder after reaching up and causing the ladder to fall. Denies LOC. He hit his head. CXR negative for fracture, head CT negative for acute changes.   He presents via [employer], civil (consulting) for a telehealth visit today. His wife called yesterday after a presyncopal event. He reports he one minute standing in the kitchen and next think he knew he was on the floor. No dizziness or symptoms prior. He did not hit his head. His wife saw him and helped him to the floor. She reports his lips were white and does not think he passed out totally. This occurred around 11 am, hours after taking his medications. He had a bowel movement earlier that morning, but at least a couple hours before this episode. Had been having problems with constipation, but resolved a few days ago. He felt normal when he came to and was able to do his regular walk. He did not check his BP. He lives at [facility] with his wife. Walks 3 miles in the halls daily. No cough, fever, or chills. Mild edema in  groin, but he has a hernia and PCP if following. Peripheral edema is improved. Wearing compression stocks. Denies dizziness. SOB is at baseline, has difficulty when wearing a face mask. Drains pleurex right side 2x/week, left side 3x/week. Tuesday both sides, Thursdays left, Sat both sides. Eating and drinking normally.  BP 120/60 HR 79 Weights 143 lbs (down from 150 lbs in February)  He denies symptoms worrisome for COVID 19.   Past Medical History:  Diagnosis Date   Actinic keratoses    Arthritis    Chronic bilateral pleural effusions    Colon polyps    Dyspnea 07/13/2013   BNP normal   Edema 07/13/2013   GERD (gastroesophageal reflux disease)    omeprazole 1-2 times daily   H/O TIA (transient ischemic attack) and stroke    on statins and plavix -saw Stuart Watson in the past   Headache    History of BPH    History of TIA (transient ischemic attack) 07/13/2013   Lumbar radiculopathy    Need for shingles vaccine    Optic neuritis 10/2015   left eye   Perennial allergic rhinitis    Polymyalgia rheumatica ([place]) 07/13/2013   Daily prednisone   Restless leg syndrome    Retinal disease    right eye since birth    Shingles    Temporal arteritis ([place]) 10/2015   Wears glasses    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   Past Surgical History:  Procedure Laterality Date   BRAVO [facility] STUDY N/A 04/18/2016   Procedure: BRAVO [place];  Surgeon: Wilford Corner, MD;  [facility];  Service: Endoscopy;  Laterality: N/A;   CHEST TUBE INSERTION Right 02/13/2018   Procedure: INSERTION PLEURAL DRAINAGE CATHETER;  Surgeon: Ivin Poot, MD;  Location: [place];  Service: Thoracic;  Laterality: Right;   COLONOSCOPY     drropy eyelid surgery     ESOPHAGOGASTRODUODENOSCOPY (EGD) WITH PROPOFOL N/A 04/18/2016   Procedure: ESOPHAGOGASTRODUODENOSCOPY (EGD) WITH PROPOFOL;  Surgeon: Wilford Corner, MD;  [facility];  Service: Endoscopy;  Laterality: N/A;   IR  THORACENTESIS ASP PLEURAL SPACE W/IMG GUIDE  02/04/2018   IR THORACENTESIS ASP PLEURAL SPACE W/IMG GUIDE  02/06/2018   RIGHT/LEFT HEART CATH AND CORONARY ANGIOGRAPHY N/A 06/24/2018   Procedure: RIGHT/LEFT HEART CATH AND CORONARY ANGIOGRAPHY;  Surgeon: Jolaine Artist, MD;  Location: [place] [facility];  Service: Cardiovascular;  Laterality: N/A;   VASECTOMY       Current Outpatient Medications  Medication Sig Dispense Refill   clopidogrel (PLAVIX) 75 MG tablet Take 75 mg by mouth daily.      doxazosin (CARDURA) 8 MG tablet Take 8 mg by mouth daily.     pantoprazole (PROTONIX) 40 MG tablet Take 40 mg by mouth daily.     potassium chloride SA (K-DUR,KLOR-CON) 20 MEQ tablet Take 2 tablets (40 mEq total) by mouth 2 (two) times daily. 180 tablet 1   simvastatin (ZOCOR) 20 MG tablet Take 20 mg by mouth daily.     torsemide (DEMADEX) 20 MG tablet Take 2 tablets (40 mg total) by mouth every morning AND 1 tablet (20 mg total) every evening. 270 tablet 1   acetaminophen (TYLENOL) 500 MG tablet Take 500 mg by mouth daily as needed for moderate pain or headache.     calcium carbonate (TUMS - DOSED IN MG ELEMENTAL CALCIUM) 500 MG chewable tablet Chew 1 tablet by mouth daily as needed for indigestion.     diphenhydrAMINE (BENADRYL) 25 MG tablet Take 25 mg by mouth daily as needed for allergies.     Multiple Vitamins-Minerals (MULTIVITAMIN ADULT PO) Take 1 tablet by mouth daily.     Omega-3 Fatty Acids (FISH OIL PO) Take 2 capsules by mouth daily.     Polyethyl Glycol-Propyl Glycol (SYSTANE OP) Place 1 drop into both eyes 2 (two) times daily as needed (dry eyes).     No current facility-administered medications for this encounter.     Allergies:   Patient has no known allergies.   Social History:  The patient  reports that he quit smoking about 43 years ago. His smoking use included cigarettes. He has a 15.00 pack-year smoking history. He has never used smokeless tobacco. He reports  current alcohol use of about 7.0 standard drinks of alcohol per week. He reports that he does not use drugs.   Family History:  The patient's family history includes Breast cancer in his mother; Coronary artery disease in his father and mother; Diabetes in his father and mother; Berenice Primas' disease in his sister; Heart attack in his father; Stroke in his mother.   ROS:  Please see the history of present illness.   All other systems are personally reviewed and negative.   Exam:  (Video/Tele Health Call; Exam is subjective and or/visual.) General:  Speaks in full sentences. No resp difficulty. Lungs: Normal respiratory effort with conversation.  Abdomen: Non-distended per patient report Extremities: +mild chronic BLE edema. Compression stockings on.  Neuro: Alert & oriented x 3.   Recent Labs: 01/01/2018: Pro B Natriuretic peptide (BNP) 60.0 01/20/2018: TSH 5.91 05/12/2018: B Natriuretic Peptide 76.9 05/19/2018: ALT 13 07/29/2018: Hemoglobin 13.2; Platelets 295 08/12/2018: BUN 27; Creatinine, Ser 1.12; Potassium 3.9; Sodium 142  Personally reviewed   Wt Readings from Last 3 Encounters:  08/12/18 69 kg (152 lb 1.6 oz)  08/07/18 68.5 kg (151 lb)  08/06/18 66.7 kg (147 lb)      ASSESSMENT AND PLAN:  1. Fall - He had an episode yesterday morning when he was standing in the kitchen and fell to the floor. It was several hours after taking his medications, did not drain pleurex yesterday, no BM prior, and did not feel dizzy. His wife helped him to the floor and does think he lost consciousness, but did say his lips were white - Will check BMET, mag, CBC, and EKG today in clinic - Discussed Ziopatch but they would like to hold off - I asked them to check BP and HR if something like this happens again. He also has hx of TIAs, so I wonder if he had another.  - Will discuss with Stuart Watson if he needs further work up.   2. Recurrent bilateral pleural effusions - Unclear etiology. Suspected yellow  nail syndrome. Stuart Beryle Beams does not think he has a hematologic cause. - Myeloma panel with small M-spike with IgG monoclonal with kappa light chains. Likely MGUS - S/p bilat pleurex tubes with Stuart [name]. 1500 from left, 900 from right each week. Now draining 3x/week on left instead of 2x/week since March   COVID screen The patient does not have any symptoms that suggest any further testing/ screening at this time.  Social distancing reinforced today.  The following changes were made today: No medication changes today.  Recommended follow-up: BMET, mag, CBC, EKG in HF clinic today. Will discuss with Stuart Watson if he needs further work up. Keep follow up with Stuart Watson in 3 weeks. Change to virtual visit.   Today, I have spent 25 minutes with the patient with telehealth technology discussing the above issues .    Addendum: Spoke with Stuart Watson and he recommends checking Ziopatch. Will set him up for 14 day real time Ziopatch today after labs in clinic.   Signed, Stuart Shore, NP  11/06/2018 9:44 AM  [address] [address] [place] [id number] [phone] (office) [phone] (fax)

## 2018-11-07 NOTE — Addendum Note (Signed)
Encounter addended by: Georgiana Shore, NP on: 11/07/2018 8:58 AM  Actions taken: Visit diagnoses modified

## 2018-11-10 DIAGNOSIS — J918 Pleural effusion in other conditions classified elsewhere: Secondary | ICD-10-CM | POA: Diagnosis not present

## 2018-11-19 ENCOUNTER — Telehealth: Payer: Medicare Other | Admitting: Cardiothoracic Surgery

## 2018-11-19 ENCOUNTER — Telehealth: Payer: Self-pay | Admitting: Cardiothoracic Surgery

## 2018-11-19 ENCOUNTER — Other Ambulatory Visit: Payer: Self-pay

## 2018-11-19 NOTE — Telephone Encounter (Signed)
  I am in the  [facility] office and completed a patient visit by telephone with [Stuart Watson] on [November 19, 2018].  I was in the office and the patient was at home. I verified that I was speaking with the correct person using two identifiers.Consent for the telephone visit was obtained from the patient who agreed to proceed.  Phone number [phone]  I discussed with the patient Mr. [name] the current drainage and care of his bilateral Pleurx catheters.  He continues to do well with managing his bilateral nonmalignant effusions with Pleurx drainage.  He denies shortness of breath and his weight has been stable in the 142-145 pound range. The left catheter is drained 3 times a week with a drainage average approximately 415 cc. The right catheteris  drained twice a week with the catheter drainage volume averaging about 380 cc.  There is no signs of redness or drainage around the exit site on either side.  Patient has had no recent chest x-ray.  I will plan on seeing him back for a standard office visit with chest x-ray in 4 weeks.

## 2018-11-20 DIAGNOSIS — J90 Pleural effusion, not elsewhere classified: Secondary | ICD-10-CM | POA: Diagnosis not present

## 2018-11-28 ENCOUNTER — Other Ambulatory Visit: Payer: Self-pay

## 2018-11-28 ENCOUNTER — Telehealth (HOSPITAL_COMMUNITY): Payer: Self-pay | Admitting: Vascular Surgery

## 2018-11-28 ENCOUNTER — Ambulatory Visit (HOSPITAL_COMMUNITY)
Admission: RE | Admit: 2018-11-28 | Discharge: 2018-11-28 | Disposition: A | Discharge status: Home / Self Care | Payer: Medicare Other | Source: Ambulatory Visit | Attending: Internal Medicine | Admitting: Internal Medicine

## 2018-11-28 ENCOUNTER — Encounter (HOSPITAL_COMMUNITY): Payer: Self-pay

## 2018-11-28 ENCOUNTER — Encounter (HOSPITAL_COMMUNITY): Payer: Self-pay | Admitting: *Deleted

## 2018-11-28 DIAGNOSIS — J90 Pleural effusion, not elsewhere classified: Secondary | ICD-10-CM

## 2018-11-28 NOTE — Patient Instructions (Signed)
Your physician recommends that you schedule a follow-up appointment in: 3-4 weeks with telehealth visit, our office will call you to schedule this  If you have any questions or concerns before your next appointment please send [place] a message through [place] or call our office at [phone].

## 2018-11-28 NOTE — Telephone Encounter (Signed)
Left pt message giving 3 week televist w/ db 5/28 @ 140, asked pt to call office to confirm appt date and time

## 2018-11-28 NOTE — Telephone Encounter (Signed)
Pt called back, appt confirmed for may 28th 1:40p. Pt amenable to date and time

## 2018-11-28 NOTE — Progress Notes (Signed)
Heart Failure TeleHealth Note  Due to national recommendations of social distancing due to [place] 19, Audio/video telehealth visit is felt to be most appropriate for this patient at this time.  See MyChart message from today for patient consent regarding telehealth for [facility].  Date:  11/28/2018   ID:  [name], DOB [birth year], MRN [id number]  Location: Home  Provider location: [facility] Type of Visit: Established patient, Add on  PCP:  Wenda Low, MD  Primary HF: Dr [name]  Chief Complaint: Fall   History of Present Illness:  Mr. Diesel is an 85 y/o male with h/o temporal arteritis/polymalgia rheumatica, previous TIA/CVA, and recurrent pleural effusions s/p bilateral Pleurex tubes.  He was last seen in HF clinic 07/30/2018. He was doing fine. K was low and he was started on daily supp. Recheck K 2/21 was stable 3.9.   He had a fall off a ladder on 08/07/18. He fell off 5 foot ladder after reaching up and causing the ladder to fall. Denies LOC. He hit his head. CXR negative for fracture, head CT negative for acute changes. Zio Patch 4/20 without any acute episodes  He presents via audio/video conferencing for a telehealth visit today. He lives at [facility] with his wife. Walks 2-3 miles in the halls daily. Denies any further falls or presyncope. Still draining with bilateral Pleurex tubes. Draining 3 days per week on each side. On R drains 2x gets about 400-500 each time. On L drains 3x/week gets 350-400 each time. Not slowing down. Still with mild LE edema. But improved. Taking torsemide 40/20. No SOB, orthopnea or PND.  He denies symptoms worrisome for COVID 19.   Past Medical History:  Diagnosis Date  . Actinic keratoses   . Arthritis   . Chronic bilateral pleural effusions   . Colon polyps   . Dyspnea 07/13/2013   BNP normal  . Edema 07/13/2013  . GERD (gastroesophageal reflux disease)    omeprazole 1-2 times daily  . H/O TIA  (transient ischemic attack) and stroke    on statins and plavix -saw Dr.Sethi in the past  . Headache   . History of BPH   . History of TIA (transient ischemic attack) 07/13/2013  . Lumbar radiculopathy   . Need for shingles vaccine   . Optic neuritis 10/2015   left eye  . Perennial allergic rhinitis   . Polymyalgia rheumatica ([place]) 07/13/2013   Daily prednisone  . Restless leg syndrome   . Retinal disease    right eye since birth   . Shingles   . Temporal arteritis ([place]) 10/2015  . Wears glasses   . 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 BRAVO [place] STUDY N/A 04/18/2016   Procedure: BRAVO [place];  Surgeon: Wilford Corner, MD;  Location: [facility];  Service: Endoscopy;  Laterality: N/A;  . CHEST TUBE INSERTION Right 02/13/2018   Procedure: INSERTION PLEURAL DRAINAGE CATHETER;  Surgeon: Ivin Poot, MD;  Location: [facility];  Service: Thoracic;  Laterality: Right;  . COLONOSCOPY    . drropy eyelid surgery    . ESOPHAGOGASTRODUODENOSCOPY (EGD) WITH PROPOFOL N/A 04/18/2016   Procedure: ESOPHAGOGASTRODUODENOSCOPY (EGD) WITH PROPOFOL;  Surgeon: Wilford Corner, MD;  [facility];  Service: Endoscopy;  Laterality: N/A;  . IR THORACENTESIS ASP PLEURAL SPACE W/IMG GUIDE  02/04/2018  . IR THORACENTESIS ASP PLEURAL SPACE W/IMG GUIDE  02/06/2018  . RIGHT/LEFT HEART CATH AND  CORONARY ANGIOGRAPHY N/A 06/24/2018   Procedure: RIGHT/LEFT HEART CATH AND CORONARY ANGIOGRAPHY;  Surgeon: Jolaine Artist, MD;  Location: [facility];  Service: Cardiovascular;  Laterality: N/A;  . VASECTOMY       Current Outpatient Medications  Medication Sig Dispense Refill  . acetaminophen (TYLENOL) 500 MG tablet Take 500 mg by mouth daily as needed for moderate pain or headache.    . calcium carbonate (TUMS - DOSED IN MG ELEMENTAL CALCIUM) 500 MG chewable tablet Chew 1 tablet by mouth daily as needed for indigestion.    . clopidogrel (PLAVIX) 75  MG tablet Take 75 mg by mouth daily.     . diphenhydrAMINE (BENADRYL) 25 MG tablet Take 25 mg by mouth daily as needed for allergies.    Marland Kitchen doxazosin (CARDURA) 8 MG tablet Take 8 mg by mouth daily.    . Multiple Vitamins-Minerals (MULTIVITAMIN ADULT PO) Take 1 tablet by mouth daily.    . Omega-3 Fatty Acids (FISH OIL PO) Take 2 capsules by mouth daily.    . pantoprazole (PROTONIX) 40 MG tablet Take 40 mg by mouth daily.    Vladimir Faster Glycol-Propyl Glycol (SYSTANE OP) Place 1 drop into both eyes 2 (two) times daily as needed (dry eyes).    . potassium chloride SA (K-DUR,KLOR-CON) 20 MEQ tablet Take 2 tablets (40 mEq total) by mouth 2 (two) times daily. 180 tablet 1  . simvastatin (ZOCOR) 20 MG tablet Take 20 mg by mouth daily.    Marland Kitchen torsemide (DEMADEX) 20 MG tablet Take 2 tablets (40 mg total) by mouth every morning AND 1 tablet (20 mg total) every evening. 270 tablet 1   No current facility-administered medications for this encounter.     Allergies:   Patient has no known allergies.   Social History:  The patient  reports that he quit smoking about 43 years ago. His smoking use included cigarettes. He has a 15.00 pack-year smoking history. He has never used smokeless tobacco. He reports current alcohol use of about 7.0 standard drinks of alcohol per week. He reports that he does not use drugs.   Family History:  The patient's family history includes Breast cancer in his mother; Coronary artery disease in his father and mother; Diabetes in his father and mother; Berenice Primas' disease in his sister; Heart attack in his father; Stroke in his mother.   ROS:  Please see the history of present illness.   All other systems are personally reviewed and negative.   Exam:  (Video/Tele Health Call; Exam is subjective and or/visual.) General:  Speaks in full sentences. No resp difficulty. Lungs: Normal respiratory effort with conversation.  Abdomen: Non-distended per patient report Extremities: +mild chronic  BLE edema. Compression stockings on.  Neuro: Alert & oriented x 3.   Recent Labs: 01/01/2018: Pro B Natriuretic peptide (BNP) 60.0 01/20/2018: TSH 5.91 05/12/2018: B Natriuretic Peptide 76.9 05/19/2018: ALT 13 11/06/2018: BUN 17; Creatinine, Ser 0.99; Hemoglobin 13.1; Magnesium 2.2; Platelets 315; Potassium 4.0; Sodium 139  Personally reviewed   Wt Readings from Last 3 Encounters:  11/06/18 67.2 kg (148 lb 3.2 oz)  08/12/18 69 kg (152 lb 1.6 oz)  08/07/18 68.5 kg (151 lb)      ASSESSMENT AND PLAN:  1. Recent fall with ? near syncope - Zio Patch placed in 4/20. NSR with rare brief runs (10 beats or less of SVT & NSVT) No pauses.  - Suspect possible vagal episode   2. Recurrent bilateral pleural effusions - Unclear etiology. We  have done an exhaustive w/u with no clear cause. . 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 Dr Beryle Beams does not think he has a hematologic cause. - Myeloma panel with small M-spike with IgG monoclonal with kappa light chains. Likely MGUS - S/p bilat pleurex tubes with stable drainage - I am not sure he will have much benefit from further diuresis to slow these down. I think best option may be to try and treat for possible yellow nail syndrome and see if it responds. I will investigate various therapies and get back to him.   3. Coronary calcifications and abnormal stress test - No s/s ischemia.  - Left heart cath as above 12/19 minimal CAD  COVID screen The patient does not have any symptoms that suggest any further testing/ screening at this time.  Social distancing reinforced today.  The following changes were made today: No medication changes today.  Recommended follow-up: I will call him 1-2 weeks to discuss options  Today, I have spent 22 minutes with the patient with telehealth technology discussing the above issues .    Addendum: Spoke with Dr [name] and he recommends checking Ziopatch. Will set him up for 14 day real time Ziopatch today after labs  in clinic.   Signed, Glori Bickers, MD  11/28/2018 10:34 AM  [facility] [address] Heart and [place] [address] [phone] (office) [phone] (fax)

## 2018-11-28 NOTE — Progress Notes (Signed)
AVS sent to pt via mychart. 

## 2018-11-28 NOTE — Addendum Note (Signed)
Encounter addended by: Scarlette Calico, RN on: 11/28/2018 11:44 AM  Actions taken: Clinical Note Signed

## 2018-12-09 DIAGNOSIS — J918 Pleural effusion in other conditions classified elsewhere: Secondary | ICD-10-CM | POA: Diagnosis not present

## 2018-12-17 ENCOUNTER — Other Ambulatory Visit: Payer: Self-pay | Admitting: General Surgery

## 2018-12-17 DIAGNOSIS — K4090 Unilateral inguinal hernia, without obstruction or gangrene, not specified as recurrent: Secondary | ICD-10-CM | POA: Diagnosis not present

## 2018-12-17 DIAGNOSIS — M353 Polymyalgia rheumatica: Secondary | ICD-10-CM | POA: Diagnosis not present

## 2018-12-17 DIAGNOSIS — E785 Hyperlipidemia, unspecified: Secondary | ICD-10-CM | POA: Diagnosis not present

## 2018-12-17 DIAGNOSIS — Z8673 Personal history of transient ischemic attack (TIA), and cerebral infarction without residual deficits: Secondary | ICD-10-CM | POA: Diagnosis not present

## 2018-12-17 DIAGNOSIS — J90 Pleural effusion, not elsewhere classified: Secondary | ICD-10-CM | POA: Diagnosis not present

## 2018-12-17 DIAGNOSIS — Z7901 Long term (current) use of anticoagulants: Secondary | ICD-10-CM | POA: Diagnosis not present

## 2018-12-17 DIAGNOSIS — Z87438 Personal history of other diseases of male genital organs: Secondary | ICD-10-CM | POA: Diagnosis not present

## 2018-12-18 ENCOUNTER — Other Ambulatory Visit: Payer: Self-pay

## 2018-12-18 ENCOUNTER — Telehealth (HOSPITAL_COMMUNITY): Payer: Self-pay | Admitting: Surgery

## 2018-12-18 ENCOUNTER — Encounter (HOSPITAL_COMMUNITY): Payer: Self-pay

## 2018-12-18 ENCOUNTER — Ambulatory Visit (HOSPITAL_COMMUNITY)
Admission: RE | Admit: 2018-12-18 | Discharge: 2018-12-18 | Disposition: A | Discharge status: Home / Self Care | Payer: Medicare Other | Source: Ambulatory Visit | Attending: Internal Medicine | Admitting: Internal Medicine

## 2018-12-18 DIAGNOSIS — R600 Localized edema: Secondary | ICD-10-CM

## 2018-12-18 DIAGNOSIS — J90 Pleural effusion, not elsewhere classified: Secondary | ICD-10-CM

## 2018-12-18 MED ORDER — TORSEMIDE 20 MG PO TABS: 40.0000 mg | ORAL_TABLET | Freq: Two times a day (BID) | ORAL | 3 refills | Status: DC

## 2018-12-18 NOTE — Addendum Note (Signed)
Encounter addended by: Marlise Eves, RN on: 12/18/2018 1:53 PM  Actions taken: Order list changed, Diagnosis association updated, Clinical Note Signed

## 2018-12-18 NOTE — Patient Instructions (Signed)
INCREASE Torsemide to 40mg  (2 tabs) two times daily  Lab work will need to be done in 1 week and again in 1 month.  Please follow up with Dr. [name] in 2 months.

## 2018-12-18 NOTE — Progress Notes (Addendum)
Heart Failure TeleHealth Note  Due to national recommendations of social distancing due to [place] 19, Audio/video telehealth visit is felt to be most appropriate for this patient at this time.  See MyChart message from today for patient consent regarding telehealth for Stuart Watson.  Date:  12/18/2018   [name], DOB 1932/01/13, MRN [id number]  Location: Home  Provider location: [facility] Type of Visit: Established patient, Add on  PCP:  Wenda Low, MD  Primary HF: Dr [name]  Chief Complaint: Fall   History of Present Illness:  Stuart Watson is an 85 y/o male with h/o temporal arteritis/polymalgia rheumatica, previous TIA/CVA, and recurrent pleural effusions s/p bilateral Pleurex tubes.  He was last seen in HF clinic 07/30/2018. He was doing fine. K was low and he was started on daily supp. Recheck K 2/21 was stable 3.9.   He had a fall off a ladder on 08/07/18. He fell off 5 foot ladder after reaching up and causing the ladder to fall. Denies LOC. He hit his head. CXR negative for fracture, head CT negative for acute changes. Zio Patch 4/20 without any acute episodes  He presents via audio/video conferencing for a telehealth visit today. He lives at [facility] with his wife. Says things are going well. Still walking 2-3 miles in the halls daily without problem. No CP or SOB. Denies any further falls or presyncope. Drainage from Pleurex starting to trend down. On left does 3 times per week down 1200 to 1050 per week. On right doing 2x/week total down to 750 per week. Taking torsemide 40/20.Says he has has a little more edema in his legs.  No SOB, orthopnea or PND. Saw Dr. Leonel Ramsay yesterday and will need surgical clearance for inguinal hernia.    He denies symptoms worrisome for COVID 19.   Past Medical History:  Diagnosis Date  . Actinic keratoses   . Arthritis   . Chronic bilateral pleural effusions   . Colon polyps   . Dyspnea  07/13/2013   BNP normal  . Edema 07/13/2013  . GERD (gastroesophageal reflux disease)    omeprazole 1-2 times daily  . H/O TIA (transient ischemic attack) and stroke    on statins and plavix -saw Dr.Sethi in the past  . Headache   . History of BPH   . History of TIA (transient ischemic attack) 07/13/2013  . Lumbar radiculopathy   . Need for shingles vaccine   . Optic neuritis 10/2015   left eye  . Perennial allergic rhinitis   . Polymyalgia rheumatica ([place]) 07/13/2013   Daily prednisone  . Restless leg syndrome   . Retinal disease    right eye since birth   . Shingles   . Temporal arteritis ([place]) 10/2015  . Wears glasses   . 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 BRAVO [place] STUDY N/A 04/18/2016   Procedure: BRAVO [place];  Surgeon: Wilford Corner, MD;  [facility];  Service: Endoscopy;  Laterality: N/A;  . CHEST TUBE INSERTION Right 02/13/2018   Procedure: INSERTION PLEURAL DRAINAGE CATHETER;  Surgeon: Ivin Poot, MD;  Location: [facility];  Service: Thoracic;  Laterality: Right;  . COLONOSCOPY    . drropy eyelid surgery    . ESOPHAGOGASTRODUODENOSCOPY (EGD) WITH PROPOFOL N/A 04/18/2016   Procedure: ESOPHAGOGASTRODUODENOSCOPY (EGD) WITH PROPOFOL;  Surgeon: Wilford Corner, MD;  [facility];  Service: Endoscopy;  Laterality: N/A;  .  IR THORACENTESIS ASP PLEURAL SPACE W/IMG GUIDE  02/04/2018  . IR THORACENTESIS ASP PLEURAL SPACE W/IMG GUIDE  02/06/2018  . RIGHT/LEFT HEART CATH AND CORONARY ANGIOGRAPHY N/A 06/24/2018   Procedure: RIGHT/LEFT HEART CATH AND CORONARY ANGIOGRAPHY;  Surgeon: Jolaine Artist, MD;  Location: [place] [facility];  Service: Cardiovascular;  Laterality: N/A;  . VASECTOMY       Current Outpatient Medications  Medication Sig Dispense Refill  . acetaminophen (TYLENOL) 500 MG tablet Take 500 mg by mouth daily as needed for moderate pain or headache.    . calcium carbonate (TUMS - DOSED IN  MG ELEMENTAL CALCIUM) 500 MG chewable tablet Chew 1 tablet by mouth daily as needed for indigestion.    . clopidogrel (PLAVIX) 75 MG tablet Take 75 mg by mouth daily.     . diphenhydrAMINE (BENADRYL) 25 MG tablet Take 25 mg by mouth daily as needed for allergies.    Marland Kitchen doxazosin (CARDURA) 8 MG tablet Take 8 mg by mouth daily.    . Multiple Vitamins-Minerals (MULTIVITAMIN ADULT PO) Take 1 tablet by mouth daily.    . Omega-3 Fatty Acids (FISH OIL PO) Take 2 capsules by mouth daily.    . pantoprazole (PROTONIX) 40 MG tablet Take 40 mg by mouth daily.    Vladimir Faster Glycol-Propyl Glycol (SYSTANE OP) Place 1 drop into both eyes 2 (two) times daily as needed (dry eyes).    . potassium chloride SA (K-DUR,KLOR-CON) 20 MEQ tablet Take 2 tablets (40 mEq total) by mouth 2 (two) times daily. 180 tablet 1  . simvastatin (ZOCOR) 20 MG tablet Take 20 mg by mouth daily.    Marland Kitchen torsemide (DEMADEX) 20 MG tablet Take 2 tablets (40 mg total) by mouth every morning AND 1 tablet (20 mg total) every evening. 270 tablet 1   No current facility-administered medications for this encounter.     Allergies:   Patient has no known allergies.   Social History:  The patient  reports that he quit smoking about 43 years ago. His smoking use included cigarettes. He has a 15.00 pack-year smoking history. He has never used smokeless tobacco. He reports current alcohol use of about 7.0 standard drinks of alcohol per week. He reports that he does not use drugs.   Family History:  The patient's family history includes Breast cancer in his mother; Coronary artery disease in his father and mother; Diabetes in his father and mother; Berenice Primas' disease in his sister; Heart attack in his father; Stroke in his mother.   ROS:  Please see the history of present illness.   All other systems are personally reviewed and negative.   Exam:  (Video/Tele Health Call; Exam is subjective and or/visual.) General:  Speaks in full sentences. No resp  difficulty. Lungs: Normal respiratory effort with conversation.  Abdomen: Non-distended per patient report Extremities: mild chronic BLE edema Neuro: Alert & oriented x 3.   Recent Labs: 01/01/2018: Pro B Natriuretic peptide (BNP) 60.0 01/20/2018: TSH 5.91 05/12/2018: B Natriuretic Peptide 76.9 05/19/2018: ALT 13 11/06/2018: BUN 17; Creatinine, Ser 0.99; Hemoglobin 13.1; Magnesium 2.2; Platelets 315; Potassium 4.0; Sodium 139  Personally reviewed   Wt Readings from Last 3 Encounters:  11/06/18 67.2 kg (148 lb 3.2 oz)  08/12/18 69 kg (152 lb 1.6 oz)  08/07/18 68.5 kg (151 lb)      ASSESSMENT AND PLAN:  1. Previous fall with ? near syncope - Zio Patch placed in 4/20. NSR with rare brief runs (10 beats or less of  SVT & NSVT) No pauses.  - Suspect possible vagal episode   2. Recurrent bilateral pleural effusions - Unclear etiology. We have done an exhaustive w/u with no clear cause. . 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 Dr Beryle Beams does not think he has a hematologic cause. - Myeloma panel with small M-spike with IgG monoclonal with kappa light chains. Likely MGUS - S/p bilat pleurex tubes with decreasing drainage - Seems to have some extra fluid on board. Increase torsemide to 40 bid - I will continue investigate various therapies for Yellow Nail syndrome and get back to him.   3. Coronary calcifications and abnormal stress test - No s/s ischemia.  - Left heart cath as above 12/19 minimal CAD  4. Pre-op eval - I feel he is likely low-risk for CV complications with inguinal hernia repair. Can proceed without further testing.   COVID screen The patient does not have any symptoms that suggest any further testing/ screening at this time.  Social distancing reinforced today.  The following changes were made today: No medication changes today.  Recommended follow-up: Labs next week. 2 month visit in office  Today, I have spent 16 minutes with the patient with telehealth technology  discussing the above issues .     Signed, Glori Bickers, MD  12/18/2018 1:27 PM  [facility] [address] Heart and [place] [address] [phone] (office) [phone] (fax)

## 2018-12-18 NOTE — Telephone Encounter (Signed)
I spoke with patient to review results of recent ZIio monitor.  He said that he had just spoken with Dr. [name] and that he had reviewed results.  He also asked me about moving his lab appt to the same day as his appt with Dr. Darcey Nora (6/5).  I have moved appt and will communicate with clinic staff.

## 2018-12-18 NOTE — Progress Notes (Signed)
Called pt X2 left voicemail to review after visit summary. Orders placed. Orders are: 1. Increase torsemide to 40 bid  2. Check BMET 1 week and 1 month  3. F/u visit with me 2 months   Will send to scheduler and send avs to mychart.

## 2018-12-19 ENCOUNTER — Other Ambulatory Visit: Payer: Self-pay

## 2018-12-19 ENCOUNTER — Ambulatory Visit (HOSPITAL_COMMUNITY)
Admission: RE | Admit: 2018-12-19 | Discharge: 2018-12-19 | Disposition: A | Discharge status: Home / Self Care | Payer: Medicare Other | Source: Ambulatory Visit | Attending: Internal Medicine | Admitting: Internal Medicine

## 2018-12-23 ENCOUNTER — Other Ambulatory Visit (HOSPITAL_COMMUNITY): Payer: Self-pay | Admitting: Internal Medicine

## 2018-12-24 ENCOUNTER — Other Ambulatory Visit: Payer: Self-pay | Admitting: Cardiothoracic Surgery

## 2018-12-24 ENCOUNTER — Encounter: Payer: Medicare Other | Admitting: Cardiothoracic Surgery

## 2018-12-24 DIAGNOSIS — J90 Pleural effusion, not elsewhere classified: Secondary | ICD-10-CM

## 2018-12-25 ENCOUNTER — Other Ambulatory Visit: Payer: Self-pay

## 2018-12-25 ENCOUNTER — Other Ambulatory Visit (HOSPITAL_COMMUNITY): Payer: Medicare Other

## 2018-12-26 ENCOUNTER — Ambulatory Visit (INDEPENDENT_AMBULATORY_CARE_PROVIDER_SITE_OTHER): Payer: Medicare Other | Admitting: Cardiothoracic Surgery

## 2018-12-26 ENCOUNTER — Encounter: Payer: Self-pay | Admitting: Cardiothoracic Surgery

## 2018-12-26 ENCOUNTER — Ambulatory Visit
Admission: RE | Admit: 2018-12-26 | Discharge: 2018-12-26 | Disposition: A | Discharge status: Home / Self Care | Payer: Medicare Other | Source: Ambulatory Visit | Attending: Cardiothoracic Surgery | Admitting: Cardiothoracic Surgery

## 2018-12-26 ENCOUNTER — Ambulatory Visit (HOSPITAL_COMMUNITY)
Admission: RE | Admit: 2018-12-26 | Discharge: 2018-12-26 | Disposition: A | Discharge status: Home / Self Care | Payer: Medicare Other | Source: Ambulatory Visit | Attending: Cardiology | Admitting: Cardiology

## 2018-12-26 VITALS — BP 121/63 | HR 68 | Temp 97.7°F | Resp 20 | Ht 66.0 in | Wt 146.0 lb

## 2018-12-26 DIAGNOSIS — Z959 Presence of cardiac and vascular implant and graft, unspecified: Secondary | ICD-10-CM | POA: Diagnosis not present

## 2018-12-26 DIAGNOSIS — J90 Pleural effusion, not elsewhere classified: Secondary | ICD-10-CM

## 2018-12-26 DIAGNOSIS — Z9689 Presence of other specified functional implants: Secondary | ICD-10-CM

## 2018-12-26 DIAGNOSIS — I2584 Coronary atherosclerosis due to calcified coronary lesion: Secondary | ICD-10-CM | POA: Diagnosis not present

## 2018-12-26 DIAGNOSIS — I509 Heart failure, unspecified: Secondary | ICD-10-CM

## 2018-12-26 DIAGNOSIS — I2510 Atherosclerotic heart disease of native coronary artery without angina pectoris: Secondary | ICD-10-CM | POA: Diagnosis not present

## 2018-12-26 LAB — BASIC METABOLIC PANEL
Anion gap: 10 (ref 5–15)
BUN: 31 mg/dL — ABNORMAL HIGH (ref 8–23)
CO2: 25 mmol/L (ref 22–32)
Calcium: 8.3 mg/dL — ABNORMAL LOW (ref 8.9–10.3)
Chloride: 104 mmol/L (ref 98–111)
Creatinine, Ser: 1.19 mg/dL (ref 0.61–1.24)
GFR calc Af Amer: >60 mL/min (ref >60)
GFR calc non Af Amer: 56 mL/min — ABNORMAL LOW (ref >60)
Glucose, Bld: 113 mg/dL — ABNORMAL HIGH (ref 70–99)
Potassium: 3.3 mmol/L — ABNORMAL LOW (ref 3.5–5.1)
Sodium: 139 mmol/L (ref 135–145)

## 2018-12-26 NOTE — Progress Notes (Signed)
PCP is Wenda Low, MD Referring Provider is Lauraine Rinne, NP  Chief Complaint  Patient presents with  . Pleural Effusion    f/u with CXR     HPI: Schedule Pleurx catheter visit. Bilateral pleural catheters placed last summer for benign large recurrent effusions-possible yellow nail syndrome Approximately 900 cc/week from each catheter. Chest x-ray image personally viewed showing catheter is in good position with minimal pleural effusions.  Left catheter drain drains 3 times a week, right catheter drains twice a week.  Catheter sites clean and dry, no cellulitis.    Past Medical History:  Diagnosis Date  . Actinic keratoses   . Arthritis   . Chronic bilateral pleural effusions   . Colon polyps   . Dyspnea 07/13/2013   BNP normal  . Edema 07/13/2013  . GERD (gastroesophageal reflux disease)    omeprazole 1-2 times daily  . H/O TIA (transient ischemic attack) and stroke    on statins and plavix -saw Dr.Sethi in the past  . Headache   . History of BPH   . History of TIA (transient ischemic attack) 07/13/2013  . Lumbar radiculopathy   . Need for shingles vaccine   . Optic neuritis 10/2015   left eye  . Perennial allergic rhinitis   . Polymyalgia rheumatica ([place]) 07/13/2013   Daily prednisone  . Restless leg syndrome   . Retinal disease    right eye since birth   . Shingles   . Temporal arteritis ([place]) 10/2015  . Wears glasses   . 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 BRAVO [place] STUDY N/A 04/18/2016   Procedure: BRAVO [place];  Surgeon: Wilford Corner, MD;  [facility];  Service: Endoscopy;  Laterality: N/A;  . CHEST TUBE INSERTION Right 02/13/2018   Procedure: INSERTION PLEURAL DRAINAGE CATHETER;  Surgeon: Ivin Poot, MD;  Location: [facility];  Service: Thoracic;  Laterality: Right;  . COLONOSCOPY    . drropy eyelid surgery    . ESOPHAGOGASTRODUODENOSCOPY (EGD) WITH PROPOFOL N/A 04/18/2016   Procedure:  ESOPHAGOGASTRODUODENOSCOPY (EGD) WITH PROPOFOL;  Surgeon: Wilford Corner, MD;  [facility];  Service: Endoscopy;  Laterality: N/A;  . IR THORACENTESIS ASP PLEURAL SPACE W/IMG GUIDE  02/04/2018  . IR THORACENTESIS ASP PLEURAL SPACE W/IMG GUIDE  02/06/2018  . RIGHT/LEFT HEART CATH AND CORONARY ANGIOGRAPHY N/A 06/24/2018   Procedure: RIGHT/LEFT HEART CATH AND CORONARY ANGIOGRAPHY;  Surgeon: Jolaine Artist, MD;  Location: [facility];  Service: Cardiovascular;  Laterality: N/A;  . VASECTOMY      Family History  Problem Relation Age of Onset  . Stroke Mother   . Coronary artery disease Mother   . Diabetes Mother   . Breast cancer Mother   . Heart attack Father   . Coronary artery disease Father   . Diabetes Father   . Graves' disease Sister     Social History Social History   Tobacco Use  . Smoking status: Former Smoker    Packs/day: 1.00    Years: 15.00    Pack years: 15.00    Types: Cigarettes    Last attempt to quit: 07/23/1975    Years since quitting: 43.4  . Smokeless tobacco: Never Used  Substance Use Topics  . Alcohol use: Yes    Alcohol/week: 7.0 standard drinks    Types: 7 Glasses of wine per week    Comment: 1 glass red wine daily  . Drug use: No  Current Outpatient Medications  Medication Sig Dispense Refill  . acetaminophen (TYLENOL) 500 MG tablet Take 500 mg by mouth daily as needed for moderate pain or headache.    . calcium carbonate (TUMS - DOSED IN MG ELEMENTAL CALCIUM) 500 MG chewable tablet Chew 1 tablet by mouth daily as needed for indigestion.    . clopidogrel (PLAVIX) 75 MG tablet Take 75 mg by mouth daily.     . diphenhydrAMINE (BENADRYL) 25 MG tablet Take 25 mg by mouth daily as needed for allergies.    Marland Kitchen doxazosin (CARDURA) 8 MG tablet Take 8 mg by mouth daily.    . Multiple Vitamins-Minerals (MULTIVITAMIN ADULT PO) Take 1 tablet by mouth daily.    . Omega-3 Fatty Acids (FISH OIL PO) Take 2 capsules by mouth daily.    .  pantoprazole (PROTONIX) 40 MG tablet Take 40 mg by mouth daily.    Vladimir Faster Glycol-Propyl Glycol (SYSTANE OP) Place 1 drop into both eyes 2 (two) times daily as needed (dry eyes).    . potassium chloride SA (K-DUR) 20 MEQ tablet TAKE 1 TABLET BY MOUTH TWO  TIMES DAILY 180 tablet 1  . simvastatin (ZOCOR) 20 MG tablet Take 20 mg by mouth daily.    Marland Kitchen torsemide (DEMADEX) 20 MG tablet Take 2 tablets (40 mg total) by mouth 2 (two) times daily. 360 tablet 3   No current facility-administered medications for this visit.     No Known Allergies  Review of Systems  Trying to eat more protein Stable mild 2+ bilateral ankle edema No problems with shortness of breath Having left inguinal hernia repair next month by Dr. Dalbert Batman  BP 121/63   Pulse 68   Temp 97.7 F (36.5 C) (Skin)   Resp 20   Ht 5\' 6"  (1.676 m)   Wt 146 lb (66.2 kg)   SpO2 96% Comment: RA  BMI 23.57 kg/m  Physical Exam Alert and comfortable Lungs clear Heart rate regular 2+ ankle edema Bilateral Pleurx dressings clean and dry  Diagnostic Tests: Chest x-ray personally reviewed with results as noted above  Impression: Controlled bilateral pleural effusions with bilateral Pleurx catheters  Plan: Continue current drainage schedule.  Continue follow-up with chest x-ray every 4 to 6 weeks.   Len Childs, MD [facility] [phone]

## 2018-12-29 ENCOUNTER — Other Ambulatory Visit (HOSPITAL_COMMUNITY): Payer: Self-pay | Admitting: *Deleted

## 2018-12-29 MED ORDER — POTASSIUM CHLORIDE CRYS ER 20 MEQ PO TBCR: 40.0000 meq | EXTENDED_RELEASE_TABLET | Freq: Two times a day (BID) | ORAL | 3 refills | Status: DC

## 2018-12-29 NOTE — Telephone Encounter (Signed)
Pt calling about his KCL dose, he states he has been on 40 meq (2 tabs) Twice daily for a long time and he is unsure why [facility] has it as 20 meq BID but his about to run out and they state it is too soon for a refill.  Per chart pt has been on 40 meq BID and no mention of changing dose.  New rx sent in for 40 meq BID.

## 2018-12-31 ENCOUNTER — Encounter (HOSPITAL_COMMUNITY): Payer: Self-pay

## 2018-12-31 ENCOUNTER — Telehealth (HOSPITAL_COMMUNITY): Payer: Self-pay

## 2018-12-31 DIAGNOSIS — I2510 Atherosclerotic heart disease of native coronary artery without angina pectoris: Secondary | ICD-10-CM

## 2018-12-31 DIAGNOSIS — I2584 Coronary atherosclerosis due to calcified coronary lesion: Secondary | ICD-10-CM

## 2018-12-31 MED ORDER — POTASSIUM CHLORIDE CRYS ER 20 MEQ PO TBCR: EXTENDED_RELEASE_TABLET | ORAL | 3 refills | Status: DC

## 2018-12-31 NOTE — Telephone Encounter (Signed)
-----   Message from Jolaine Artist, MD sent at 12/30/2018  9:31 PM EDT ----- Have him take extra 60 kcl and increase daily dose by 20kcl.. repeat 1 week

## 2018-12-31 NOTE — Telephone Encounter (Signed)
Pt aware of lab results. He will take 3 extra tabs Potassium today. As patient waiting for mail order to arrive, 1 month supply sent to local pharmacy with new dose. Verbalized understanding. Advised to call office if any concerns

## 2019-01-02 ENCOUNTER — Telehealth (HOSPITAL_COMMUNITY): Payer: Self-pay

## 2019-01-02 NOTE — Telephone Encounter (Signed)
Pt sent message on mychart that he was not able to pick up potassium as it was too soon according to insurance company.  LM with patient to call office to discuss

## 2019-01-05 NOTE — Telephone Encounter (Signed)
Following up with patient, LM on VM

## 2019-01-05 NOTE — Telephone Encounter (Signed)
Pt called back and stated he has enough potassium to last him until [facility] sends out new script. Verified dose of torsemide and potassium. PT confirmed lab appt 6/17 at 10am.

## 2019-01-06 DIAGNOSIS — J918 Pleural effusion in other conditions classified elsewhere: Secondary | ICD-10-CM | POA: Diagnosis not present

## 2019-01-07 ENCOUNTER — Other Ambulatory Visit (HOSPITAL_COMMUNITY): Payer: Self-pay

## 2019-01-07 ENCOUNTER — Ambulatory Visit (HOSPITAL_COMMUNITY)
Admission: RE | Admit: 2019-01-07 | Discharge: 2019-01-07 | Disposition: A | Discharge status: Home / Self Care | Payer: Medicare Other | Source: Ambulatory Visit | Attending: Internal Medicine | Admitting: Internal Medicine

## 2019-01-07 ENCOUNTER — Other Ambulatory Visit: Payer: Self-pay

## 2019-01-07 DIAGNOSIS — J90 Pleural effusion, not elsewhere classified: Secondary | ICD-10-CM | POA: Diagnosis not present

## 2019-01-07 DIAGNOSIS — I2510 Atherosclerotic heart disease of native coronary artery without angina pectoris: Secondary | ICD-10-CM | POA: Diagnosis not present

## 2019-01-07 DIAGNOSIS — I2584 Coronary atherosclerosis due to calcified coronary lesion: Secondary | ICD-10-CM | POA: Diagnosis not present

## 2019-01-07 LAB — BASIC METABOLIC PANEL
Anion gap: 9 (ref 5–15)
BUN: 24 mg/dL — ABNORMAL HIGH (ref 8–23)
CO2: 25 mmol/L (ref 22–32)
Calcium: 8 mg/dL — ABNORMAL LOW (ref 8.9–10.3)
Chloride: 104 mmol/L (ref 98–111)
Creatinine, Ser: 1.12 mg/dL (ref 0.61–1.24)
GFR calc Af Amer: >60 mL/min (ref >60)
GFR calc non Af Amer: 60 mL/min — ABNORMAL LOW (ref >60)
Glucose, Bld: 122 mg/dL — ABNORMAL HIGH (ref 70–99)
Potassium: 3.5 mmol/L (ref 3.5–5.1)
Sodium: 138 mmol/L (ref 135–145)

## 2019-01-07 MED ORDER — POTASSIUM CHLORIDE CRYS ER 20 MEQ PO TBCR: EXTENDED_RELEASE_TABLET | ORAL | 3 refills | Status: DC

## 2019-01-07 NOTE — Addendum Note (Signed)
Addended by: Valeda Malm on: 01/07/2019 03:40 PM   Modules accepted: Orders

## 2019-01-16 ENCOUNTER — Encounter (HOSPITAL_COMMUNITY): Payer: Self-pay

## 2019-01-16 NOTE — Pre-Procedure Instructions (Signed)
Stuart Watson  01/16/2019     [place], [place] [facility] [address] [place] [address] Phone: [phone] Fax: [phone]   Your procedure is scheduled on Tuesday, July 7th.  Report to [facility] Entrance A at 6:00 A.M.  Call this number if you have problems the morning of surgery:  [phone]   Remember:  Do not eat after midnight.  You may drink clear liquids until 5:00 A.M.  Clear liquids allowed are:    Water, Juice (non-citric and without pulp), Carbonated beverages, Clear Tea, Black Coffee only, Plain Jell-O only, Gatorade and Plain Popsicles only    Take these medicines the morning of surgery with A SIP OF WATER doxazosin (CARDURA) pantoprazole (PROTONIX)  simvastatin (ZOCOR)   If needed - acetaminophen (TYLENOL), diphenhydrAMINE (BENADRYL), Polyethyl Glycol-Propyl Glycol (SYSTANE OP) eye drops    Special instructions:   [place]- Preparing For Surgery  Before surgery, you can play an important role. Because skin is not sterile, your skin needs to be as free of germs as possible. You can reduce the number of germs on your skin by washing with CHG (chlorahexidine gluconate) Soap before surgery.  CHG is an antiseptic cleaner which kills germs and bonds with the skin to continue killing germs even after washing.    Oral Hygiene is also important to reduce your risk of infection.  Remember - BRUSH YOUR TEETH THE MORNING OF SURGERY WITH YOUR REGULAR TOOTHPASTE  Please do not use if you have an allergy to CHG or antibacterial soaps. If your skin becomes reddened/irritated stop using the CHG.  Do not shave (including legs and underarms) for at least 48 hours prior to first CHG shower. It is OK to shave your face.  Please follow these instructions carefully.   1. Shower the NIGHT BEFORE SURGERY and the MORNING OF SURGERY with CHG.   2. If you chose to wash your hair, wash your hair first as usual with your  normal shampoo.  3. After you shampoo, rinse your hair and body thoroughly to remove the shampoo.  4. Use CHG as you would any other liquid soap. You can apply CHG directly to the skin and wash gently with a scrungie or a clean washcloth.   5. Apply the CHG Soap to your body ONLY FROM THE NECK DOWN.  Do not use on open wounds or open sores. Avoid contact with your eyes, ears, mouth and genitals (private parts). Wash Face and genitals (private parts)  with your normal soap.  6. Wash thoroughly, paying special attention to the area where your surgery will be performed.  7. Thoroughly rinse your body with warm water from the neck down.  8. DO NOT shower/wash with your normal soap after using and rinsing off the CHG Soap.  9. Pat yourself dry with a CLEAN TOWEL.  10. Wear CLEAN PAJAMAS to bed the night before surgery, wear comfortable clothes the morning of surgery  11. Place CLEAN SHEETS on your bed the night of your first shower and DO NOT SLEEP WITH PETS.  Day of Surgery: Do not wear jewelry, make-up or nail polish.  Do not wear lotions, powders, or perfumes, or deodorant.  Do not shave 48 hours prior to surgery.    Do not bring valuables to the hospital.  [facility] is not responsible for any belongings or valuables.  Please wear clean clothes to the hospital/surgery center.   Remember to brush your  teeth WITH YOUR REGULAR TOOTHPASTE.  Contacts, dentures or bridgework may not be worn into surgery.  Leave your suitcase in the car.  After surgery it may be brought to your room.  For patients admitted to the hospital, discharge time will be determined by your treatment team.  Patients discharged the day of surgery will not be allowed to drive home.   Please read over the following fact sheets that you were given. Pain Booklet and Coughing and Deep Breathing

## 2019-01-19 ENCOUNTER — Encounter (HOSPITAL_COMMUNITY): Payer: Self-pay

## 2019-01-19 ENCOUNTER — Encounter (HOSPITAL_COMMUNITY)
Admission: RE | Admit: 2019-01-19 | Discharge: 2019-01-19 | Disposition: A | Discharge status: Home / Self Care | Payer: Medicare Other | Source: Ambulatory Visit | Attending: General Surgery | Admitting: General Surgery

## 2019-01-19 ENCOUNTER — Ambulatory Visit (HOSPITAL_COMMUNITY)
Admission: RE | Admit: 2019-01-19 | Discharge: 2019-01-19 | Disposition: A | Discharge status: Home / Self Care | Payer: Medicare Other | Source: Ambulatory Visit | Attending: General Surgery | Admitting: General Surgery

## 2019-01-19 ENCOUNTER — Ambulatory Visit (HOSPITAL_COMMUNITY)
Admission: RE | Admit: 2019-01-19 | Discharge: 2019-01-19 | Disposition: A | Discharge status: Home / Self Care | Payer: Medicare Other | Source: Ambulatory Visit | Attending: Cardiology | Admitting: Cardiology

## 2019-01-19 ENCOUNTER — Other Ambulatory Visit: Payer: Self-pay

## 2019-01-19 ENCOUNTER — Telehealth (HOSPITAL_COMMUNITY): Payer: Self-pay

## 2019-01-19 DIAGNOSIS — Z87891 Personal history of nicotine dependence: Secondary | ICD-10-CM | POA: Diagnosis not present

## 2019-01-19 DIAGNOSIS — M353 Polymyalgia rheumatica: Secondary | ICD-10-CM | POA: Insufficient documentation

## 2019-01-19 DIAGNOSIS — J90 Pleural effusion, not elsewhere classified: Secondary | ICD-10-CM | POA: Diagnosis not present

## 2019-01-19 DIAGNOSIS — Z01818 Encounter for other preprocedural examination: Secondary | ICD-10-CM

## 2019-01-19 DIAGNOSIS — R0600 Dyspnea, unspecified: Secondary | ICD-10-CM | POA: Diagnosis not present

## 2019-01-19 DIAGNOSIS — Z79899 Other long term (current) drug therapy: Secondary | ICD-10-CM | POA: Insufficient documentation

## 2019-01-19 DIAGNOSIS — K4090 Unilateral inguinal hernia, without obstruction or gangrene, not specified as recurrent: Secondary | ICD-10-CM | POA: Diagnosis not present

## 2019-01-19 DIAGNOSIS — Z7902 Long term (current) use of antithrombotics/antiplatelets: Secondary | ICD-10-CM | POA: Diagnosis not present

## 2019-01-19 DIAGNOSIS — M1990 Unspecified osteoarthritis, unspecified site: Secondary | ICD-10-CM | POA: Insufficient documentation

## 2019-01-19 DIAGNOSIS — Z8673 Personal history of transient ischemic attack (TIA), and cerebral infarction without residual deficits: Secondary | ICD-10-CM | POA: Insufficient documentation

## 2019-01-19 DIAGNOSIS — G2581 Restless legs syndrome: Secondary | ICD-10-CM | POA: Insufficient documentation

## 2019-01-19 DIAGNOSIS — K219 Gastro-esophageal reflux disease without esophagitis: Secondary | ICD-10-CM | POA: Diagnosis not present

## 2019-01-19 DIAGNOSIS — R188 Other ascites: Secondary | ICD-10-CM | POA: Insufficient documentation

## 2019-01-19 HISTORY — DX: Chronic sinusitis, unspecified: J32.9

## 2019-01-19 LAB — CBC WITH DIFFERENTIAL/PLATELET
Abs Immature Granulocytes: 0.05 10*3/uL (ref 0.00–0.07)
Basophils Absolute: 0.1 10*3/uL (ref 0.0–0.1)
Basophils Relative: 1 %
Eosinophils Absolute: 0.1 10*3/uL (ref 0.0–0.5)
Eosinophils Relative: 2 %
HCT: 40 % (ref 39.0–52.0)
Hemoglobin: 12.8 g/dL — ABNORMAL LOW (ref 13.0–17.0)
Immature Granulocytes: 1 %
Lymphocytes Relative: 22 %
Lymphs Abs: 1.4 10*3/uL (ref 0.7–4.0)
MCH: 31.7 pg (ref 26.0–34.0)
MCHC: 32 g/dL (ref 30.0–36.0)
MCV: 99 fL (ref 80.0–100.0)
Monocytes Absolute: 0.6 10*3/uL (ref 0.1–1.0)
Monocytes Relative: 9 %
Neutro Abs: 4.1 10*3/uL (ref 1.7–7.7)
Neutrophils Relative %: 65 %
Platelets: 384 10*3/uL (ref 150–400)
RBC: 4.04 MIL/uL — ABNORMAL LOW (ref 4.22–5.81)
RDW: 13.1 % (ref 11.5–15.5)
WBC: 6.4 10*3/uL (ref 4.0–10.5)
nRBC: 0 % (ref 0.0–0.2)

## 2019-01-19 LAB — COMPREHENSIVE METABOLIC PANEL
ALT: 11 U/L (ref 0–44)
AST: 20 U/L (ref 15–41)
Albumin: 2.5 g/dL — ABNORMAL LOW (ref 3.5–5.0)
Alkaline Phosphatase: 43 U/L (ref 38–126)
Anion gap: 8 (ref 5–15)
BUN: 24 mg/dL — ABNORMAL HIGH (ref 8–23)
CO2: 26 mmol/L (ref 22–32)
Calcium: 8.2 mg/dL — ABNORMAL LOW (ref 8.9–10.3)
Chloride: 105 mmol/L (ref 98–111)
Creatinine, Ser: 1.03 mg/dL (ref 0.61–1.24)
GFR calc Af Amer: >60 mL/min (ref >60)
GFR calc non Af Amer: >60 mL/min (ref >60)
Glucose, Bld: 120 mg/dL — ABNORMAL HIGH (ref 70–99)
Potassium: 4.3 mmol/L (ref 3.5–5.1)
Sodium: 139 mmol/L (ref 135–145)
Total Bilirubin: 0.4 mg/dL (ref 0.3–1.2)
Total Protein: 6.3 g/dL — ABNORMAL LOW (ref 6.5–8.1)

## 2019-01-19 NOTE — Telephone Encounter (Signed)
Office note faxed over to [facility] for surgical clearance 01/16/2019.

## 2019-01-19 NOTE — Pre-Procedure Instructions (Signed)
   Stuart Watson  01/19/2019     Your procedure is scheduled on Tuesday, January 27, 2019  Report to [facility] Admitting at 6:00 A.M.  Call this number if you have problems the morning of surgery:  [phone]   Remember:Brush your teeth the morning of surgery with your regular toothpaste.  Do not eat after midnight Monday, January 26, 2019  You may drink clear liquids until 5:00A.M. ( 3 hours prior to surgery) .  Clear liquids allowed are:  Water, Juice (non-citric and without pulp), Carbonated beverages, Clear Tea, Black Coffee only, Plain Jell-O only, Gatorade and Plain Popsicles only    Take these medicines the morning of surgery with A SIP OF WATER : doxazosin (CARDURA), pantoprazole (PROTONIX), simvastatin (ZOCOR)  If needed: acetaminophen (TYLENOL) for pain or headache If needed: diphenhydrAMINE (BENADRYL) for allergies If needed: Polyethyl Glycol (SYSTANE OP) eye drops for dry eyes  Follow surgeon's instructions regarding clopidogrel (PLAVIX), if no pre-op instructions were provided, contact surgeon.  7 days prior to surgery, stop taking Aspirin (unless otherwise advised by surgeon), vitamins, fish oil and herbal medications. Do not take any NSAIDs ie: Ibuprofen, Advil, Naproxen (Aleve), Motrin, BC and Goody Powder.  Do not wear jewelry, make-up or nail polish.  Do not wear lotions, powders, or perfumes, or deodorant.  Do not shave 48 hours prior to surgery.  Men may shave face and neck.  Do not bring valuables to the hospital.  [facility] is not responsible for any belongings or valuables.  Contacts, dentures or bridgework may not be worn into surgery. Patients discharged the day of surgery will not be allowed to drive home.  Special instructions:Shower the night before and morning of surgery with CHG. Please read over the following fact sheets that you were given. Pain Booklet, Coughing and Deep Breathing and Surgical Site Infection Prevention

## 2019-01-19 NOTE — Progress Notes (Signed)
Pt denies SOB and chest pain. Pt stated that he is under the care of Dr. [name], Cardiolgy. Pt stated hat PCP is Dr. [name]. Karoline Caldwell, PA, Anesthesiology, clarified that chest x ray should be performed during PAT visit. Pt denies recent labs. Pt stated that he was instructed to stop taking Plavix 5 days prior to surgery.  Pt denies that he and family members tested positive for COVID -44. Pt stated that he is scheduled for his COVID-19 test on 01/23/19 ( pt reminded to quarantine). Pt denies that he and family members experienced the following symptoms:  Cough yes/no: No Fever (>100.3F)  yes/no: No Runny nose yes/no: No Sore throat yes/no: No Difficulty breathing/shortness of breath  yes/no: No  Have you or a family member traveled in the last 14 days and where? yes/no: No  Pt reminded that hospital visitation restrictions are in effect and the importance of the restrictions.  Pt verbalized understanding of all pre-op instructions.   Pt chart forwarded to PA, Anesthesiology, to review pt history.

## 2019-01-20 NOTE — Progress Notes (Signed)
Anesthesia Chart Review:  Case: [id number] Date/Time: 01/27/19 0745   Procedure: OPEN REPAIR LEFT INGUINAL HERNIA WITH MESH (Left ) - GENERAL AND TAP BLOCK ANESTHESIA   Anesthesia type: General   Pre-op diagnosis: painful left inguinal hernia   Location: [place] OR ROOM 02 / [place] OR   Surgeon: Fanny Skates, MD      DISCUSSION: Patient is an 85 year old male scheduled for the above procedure.  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   In regards to recurrent bilateral pleural effusions, Dr. [name] wrote,  "- Unclear etiology. We have done an exhaustive w/u with no clear cause. . 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 Dr Beryle Beams does not think he has a hematologic cause. - Myeloma panel with small M-spike with IgG monoclonal with kappa light chains. Likely MGUS - S/p bilat pleurex tubes with decreasing drainage - Seems to have some extra fluid on board. Increase torsemide to 40 bid - I will continue investigate various therapies for Yellow Nail syndrome and get back to him."  - Dr [name] also wrote on 12/18/18, "Pre-op eval - I feel he is likely low-risk for CV complications with inguinal hernia repair. Can proceed without further testing." Patient reports being instructed to hold Plavix for 5 days prior to surgery.  Presurgical COVID test scheduled for 01/23/19. If negative and otherwise no acute changes then I would anticipate that he could proceed as planned.   VS: BP (!) 122/54   Pulse 80   Temp 37.1 C (Oral)   Resp 18   Ht 5\' 6"  (1.676 m)   Wt 66.7 kg   SpO2 100%   BMI 23.75 kg/m   PROVIDERS: Wenda Low, MD is PCP - Glori Bickers, MD is HF cardiologist. Last visit 12/18/18. At that time, he was still living at [facility] with his wife and walking 2-3 miles in  the halls daily without problem. Recent fall, possible near syncope felt possibly vagal episode (no sustained abnormalities on Ziopatch).  Ivin Poot, MD is CT surgeon. Last visit 12/26/18. Continue current drainage regimen. (Left catheter drain drains 3 times a week, right catheter drains twice a week.) He is aware of surgery plans.  - Murriel Hopper, MD is hematologist. Last visit 08/12/18.   LABS: Labs reviewed: Acceptable for surgery. (all labs ordered are listed, but only abnormal results are displayed)  Labs Reviewed  CBC WITH DIFFERENTIAL/PLATELET - Abnormal; Notable for the following components:      Result Value   RBC 4.04 (*)    Hemoglobin 12.8 (*)    All other components within normal limits  COMPREHENSIVE METABOLIC PANEL - Abnormal; Notable for the following components:   Glucose, Bld 120 (*)    BUN 24 (*)    Calcium 8.2 (*)    Total Protein 6.3 (*)    Albumin 2.5 (*)    All other components within normal limits     IMAGES: CXR 01/19/19: IMPRESSION: Bibasilar PleurX catheters in place. Stable small bilateral pleural effusions. Mild bibasilar scarring versus atelectasis.  Liver US 01/15/18: IMPRESSION: Portal, hepatic, and splenic veins are patent. Minimal ascites. Bilateral pleural effusions.   EKG: 11/06/18: Normal sinus rhythm with sinus arrhythmia No significant change since last tracing 06/24/18 Confirmed by Minus Breeding [phone]) on 11/06/2018 8:37:27 PM   CV: ZioPatch 11/06/18-11/13/18: 1. Sinus rhythm Patient had a min HR  of 49 bpm, max HR of 185 bpm, and avg HR of 69 bpm. 2. Two brief runs NSVT 2 Ventricular Tachycardia runs - longest 10 beats 3. 16 brief runs of SVT - the longest lasting 10 beats with an avg rate of 107 bpm. 4. Rare PVCs  MR Cardiac 07/08/18: IMPRESSION: 1.  Moderate left pleural effusion. 2.  Normal left and right ventricular size and systolic function. 3. Delayed enhancement images were difficult, but no definite  LGE was seen. Therefore, no definitive evidence for prior MI, infiltrative disease, or myocarditis.  RHC/LHC 06/24/18 [name], Daniel, MD):  Ost LAD lesion is 30% stenosed. Findings: Ao = 108/45 (72) LV = 120/12 RA = 4 RV = 31/6 PA = 31/8 (19) PCW = 16 Fick cardiac output/index = 8.2/4.7 PVR = 0.5 WU FA sat = 93% PA sat = 74%, 76% SVC sat = 77% Assessment: 1. Diffusely caclified coronaries with no significant obstructive disease 2. Normal intracardiac pressures with high cardiac output Plan/Discussion: Volume status much improved with diuresis. No evidence of PAH. Cardiac output is high without any evidence of intracardiac shunting.   Nuclear stress test 01/29/18: Study Highlights:  Nuclear stress EF: 65%.  The left ventricular ejection fraction is normal (55-65%).  There is a large defect of severe severity present in the basal inferior, mid inferior, apical septal, apical inferior, apical lateral and apex location. The defect is mainly fixed but there is a small area of reversibility in the inferolateral wall. In the setting of normal LVF, this is most likely diaphragmatic attenuation artifact but cannot rule out ischemia in the inferolateral wall.  Blood pressure demonstrated a normal response to exercise.  There was no ST segment deviation noted during stress.  Perfusion changes are new compared to study in 2014.  This is an intermediate risk study.  Echo 12/04/17: Study Conclusions - Left ventricle: The cavity size was normal. Wall thickness was   normal. Systolic function was normal. The estimated ejection   fraction was in the range of 60% to 65%. Wall motion was normal;   there were no regional wall motion abnormalities. Doppler   parameters are consistent with abnormal left ventricular   relaxation (grade 1 diastolic dysfunction). - Mitral valve: There was mild regurgitation. - Pulmonary arteries: Systolic pressure was mildly increased. PA   peak pressure:  35 mm Hg (S). - Pericardium, extracardiac: A trivial pericardial effusion was   identified. There was a left pleural effusion.   Past Medical History:  Diagnosis Date  . Actinic keratoses   . Arthritis   . Chronic bilateral pleural effusions   . Chronic sinusitis   . Colon polyps   . Dyspnea 07/13/2013   BNP normal  . Edema 07/13/2013  . GERD (gastroesophageal reflux disease)    omeprazole 1-2 times daily  . H/O TIA (transient ischemic attack) and stroke    on statins and plavix -saw Dr.Sethi in the past  . Headache   . History of BPH   . History of TIA (transient ischemic attack) 07/13/2013  . Left inguinal hernia   . Lumbar radiculopathy   . Need for shingles vaccine   . Optic neuritis 10/2015   left eye  . Perennial allergic rhinitis   . Polymyalgia rheumatica ([place]) 07/13/2013   Daily prednisone  . Restless leg syndrome   . Retinal disease    right eye since birth   . Shingles   . Temporal arteritis ([place]) 10/2015  . Wears glasses   . 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 BRAVO [place] STUDY N/A 04/18/2016   Procedure: BRAVO [place];  Surgeon: Wilford Corner, MD;  [facility];  Service: Endoscopy;  Laterality: N/A;  . CHEST TUBE INSERTION Right 02/13/2018   Procedure: INSERTION PLEURAL DRAINAGE CATHETER;  Surgeon: Ivin Poot, MD;  Location: [place];  Service: Thoracic;  Laterality: Right;  . COLONOSCOPY    . drropy eyelid surgery    . ESOPHAGOGASTRODUODENOSCOPY (EGD) WITH PROPOFOL N/A 04/18/2016   Procedure: ESOPHAGOGASTRODUODENOSCOPY (EGD) WITH PROPOFOL;  Surgeon: Wilford Corner, MD;  [facility];  Service: Endoscopy;  Laterality: N/A;  . IR THORACENTESIS ASP PLEURAL SPACE W/IMG GUIDE  02/04/2018  . IR THORACENTESIS ASP PLEURAL SPACE W/IMG GUIDE  02/06/2018  . RIGHT/LEFT HEART CATH AND CORONARY ANGIOGRAPHY N/A 06/24/2018   Procedure: RIGHT/LEFT HEART CATH AND CORONARY ANGIOGRAPHY;  Surgeon:  Jolaine Artist, MD;  Location: [place] [facility] LAB;  Service: Cardiovascular;  Laterality: N/A;  . VASECTOMY      MEDICATIONS: . cetirizine (ZYRTEC) 10 MG tablet  . acetaminophen (TYLENOL) 500 MG tablet  . calcium carbonate (TUMS - DOSED IN MG ELEMENTAL CALCIUM) 500 MG chewable tablet  . clopidogrel (PLAVIX) 75 MG tablet  . diphenhydrAMINE (BENADRYL) 25 MG tablet  . doxazosin (CARDURA) 8 MG tablet  . Multiple Vitamins-Minerals (MULTIVITAMIN ADULT PO)  . Omega-3 Fatty Acids (FISH OIL PO)  . pantoprazole (PROTONIX) 40 MG tablet  . Polyethyl Glycol-Propyl Glycol (SYSTANE OP)  . potassium chloride SA (K-DUR) 20 MEQ tablet  . simvastatin (ZOCOR) 20 MG tablet  . torsemide (DEMADEX) 20 MG tablet   No current facility-administered medications for this encounter.     Myra Gianotti, PA-C Surgical Short Stay/Anesthesiology [facility] Phone [phone] [facility] Phone [phone] 01/20/2019 2:38 PM

## 2019-01-20 NOTE — Anesthesia Preprocedure Evaluation (Addendum)
Anesthesia Evaluation  Patient identified by MRN, date of birth, ID band Patient awake    Reviewed: Allergy & Precautions, NPO status , Patient's Chart, lab work & pertinent test results  History of Anesthesia Complications Negative for: history of anesthetic complications  Airway Mallampati: II  TM Distance: >3 FB Neck ROM: Full    Dental no notable dental hx.    Pulmonary former smoker,  Chronic bilateral pleural effusions s/p bilateral pleurex catheters   Pulmonary exam normal        Cardiovascular + CAD (on Plavix)  Normal cardiovascular exam  Nuclear stress 2019: EF: 65%, large defect of severe severity present in the basal inferior, mid inferior, apical septal, apical inferior, apical lateral and apex location. The defect is mainly fixed but there is a small area of reversibility in the inferolateral wall. In the setting of normal LVF, this is most likely diaphragmatic attenuation artifact but cannot rule out ischemia in the inferolateral wall, intermediate risk study.  Cardiac cath 2019: Ost LAD lesion 30% stenosed, no interventions   Neuro/Psych Lumbar radiculopathy TIA (2014)   GI/Hepatic Neg liver ROS, GERD  Medicated and Controlled,  Endo/Other  negative endocrine ROS  Renal/GU negative Renal ROS     Musculoskeletal  (+) Arthritis , Polymyalgia rheumatica on chronic prednisone   Abdominal   Peds  Hematology negative hematology ROS (+)   Anesthesia Other Findings Day of surgery medications reviewed with the patient.  Reproductive/Obstetrics                          Anesthesia Physical Anesthesia Plan  ASA: III  Anesthesia Plan: General   Post-op Pain Management: GA combined w/ Regional for post-op pain   Induction: Intravenous  PONV Risk Score and Plan: 2 and Treatment may vary due to age or medical condition and Ondansetron  Airway Management Planned: Oral ETT  Additional  Equipment: None  Intra-op Plan:   Post-operative Plan: Extubation in OR  Informed Consent: I have reviewed the patients History and Physical, chart, labs and discussed the procedure including the risks, benefits and alternatives for the proposed anesthesia with the patient or authorized representative who has indicated his/her understanding and acceptance.     Dental advisory given  Plan Discussed with: CRNA  Anesthesia Plan Comments: (PAT note written 01/20/2019 by Myra Gianotti, PA-C. )      Anesthesia Quick Evaluation

## 2019-01-23 ENCOUNTER — Other Ambulatory Visit (HOSPITAL_COMMUNITY)
Admission: RE | Admit: 2019-01-23 | Discharge: 2019-01-23 | Disposition: A | Discharge status: Home / Self Care | Payer: Medicare Other | Source: Ambulatory Visit | Attending: General Surgery | Admitting: General Surgery

## 2019-01-23 DIAGNOSIS — Z1159 Encounter for screening for other viral diseases: Secondary | ICD-10-CM | POA: Insufficient documentation

## 2019-01-23 DIAGNOSIS — Z01812 Encounter for preprocedural laboratory examination: Secondary | ICD-10-CM | POA: Diagnosis not present

## 2019-01-24 LAB — SARS CORONAVIRUS 2 (TAT 6-24 HRS)

## 2019-01-25 NOTE — H&P (Signed)
Stuart Watson Location: [facility] Patient #: [id number] DOB: May 22, 1932 Married / Language: English / Race: White Male        History of Present Illness       This is a very pleasant 85 year old man, referred by Dr. Wenda Watson for evaluation and management of a symptomatic left inguinal hernia. His cardiologist is Dr. Haroldine Watson His thoracic surgeon is Dr. Prescott Watson.      He's had a progressive left inguinal hernia for 6 months. It has never been incarcerated but is he is having progressively more pain. This is impairing any heavy lifting now. He moved to [name] about 6 months ago and was doing a lot of heavy lifting anything excess when it started. No prior history of hernia or hernia surgery      Comorbidities include chronic bilateral pleural catheters since July 2019. 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 His wife drains the catheters every other day. TIA in 2008. Stuart Watson put him on Plavix and simvastatin. He is now followed by Dr. Lysle Watson. Also followed by Dr. Haroldine Watson from cardiology. History of polymyalgia rheumatica but not on any strong drug such as prednisone or immunotherapy. Has been seen by Dr. Trudie Watson in the past. BPH. Social history reveals he is married. Living at [facility] in independent living. He drives his own car. It sounds like his wife's health is stable as well. He states that he is pretty healthy and feels good other than the problems mentioned above      We talked about inguinal hernia repair. We talked about laparoscopic and open repair. Because of the size of the hernia and think that he would do better with open repair with TAPP block and exparel.  Think I would keep him at [facility] overnight for observation.      He will need to stop his Plavix 5 days preop. We will ask for cardiac risk assessment by his cardiologist. I discussed S the indications, details, techniques,  and numerous risk of the surgery with him. He is aware the risk of bleeding, infection, recurrence, reoperation for complications, injury to the testicle, injury to the bladder or bowel all the rare, cardiac pulmonary and thromboembolic problems. He understands these issues well. All of his questions are answered. He agrees with this plan.     It is his preference to go ahead with this surgery sometime in the early summer. He does not want to have an emergency surgery while on Plavix. He does not want to have a complication and have to go to skilled nursing either   Past Surgical History  Colon Polyp Removal - Colonoscopy  Vasectomy   Diagnostic Studies History  Colonoscopy  5-10 years ago 1-5 years ago  Allergies  No Known Allergies   No Known Drug Allergies   Allergies Reconciled   Medication History Triamcinolone Acetonide (0.1% Cream, External) Active. Simvastatin (20MG Tablet, Oral) Active. Omeprazole (20MG Capsule DR, Oral) Active. Furosemide (20MG Tablet, Oral) Active. Fluzone High-Dose (0.5ML Susp Pref Syr, Intramuscular) Active. Fluticasone Propionate (50MCG/ACT Suspension, Nasal) Active. Doxazosin Mesylate (8MG Tablet, Oral) Active. Clopidogrel Bisulfate (75MG Tablet, Oral) Active. ZyrTEC (5MG Tablet, Oral) Active. Terbinex (250 MG & 1% Kit, Combination) Active. Multivitamins (Oral) Active. Fish Oil Burp-Less (1000MG Capsule, Oral) Active. Vitamin D3 (10000UNIT Capsule, Oral) Active. Vitamin B12 (100MCG Tablet, Oral) Active. Torsemide (20MG Tablet, Oral) Active. Potassium Chloride Crys ER (20MEQ Tablet ER, Oral) Active. Famotidine (20MG Tablet,  Oral) Active. Pantoprazole Sodium (20MG Tablet DR, Oral) Active. Medications Reconciled  Social History  Alcohol use  Moderate alcohol use. Caffeine use  Coffee, Tea. Illicit drug use  Uses socially only. Tobacco use  Former smoker.  Family History Breast Cancer   Mother. Cerebrovascular Accident  Mother. Colon Polyps  Father. Depression  Sister, Son. Diabetes Mellitus  Mother. Hypertension  Mother, Sister.  Other Problems  Cerebrovascular Accident     Review of Systems  General Not Present- Appetite Loss, Chills, Fatigue, Fever, Night Sweats, Weight Gain and Weight Loss. Skin Not Present- Change in Wart/Mole, Dryness, Hives, Jaundice, New Lesions, Non-Healing Wounds, Rash and Ulcer. HEENT Present- Seasonal Allergies, Visual Disturbances and Wears glasses/contact lenses. Not Present- Earache, Hearing Loss, Hoarseness, Nose Bleed, Oral Ulcers, Ringing in the Ears, Sinus Pain, Sore Throat and Yellow Eyes. Respiratory Present- Chronic Cough. Not Present- Bloody sputum, Difficulty Breathing, Snoring and Wheezing. Breast Not Present- Breast Mass, Breast Pain, Nipple Discharge and Skin Changes. Cardiovascular Present- Swelling of Extremities. Not Present- Chest Pain, Difficulty Breathing Lying Down, Leg Cramps, Palpitations, Rapid Heart Rate and Shortness of Breath. Gastrointestinal Not Present- Abdominal Pain, Bloating, Bloody Stool, Change in Bowel Habits, Chronic diarrhea, Constipation, Difficulty Swallowing, Excessive gas, Gets full quickly at meals, Hemorrhoids, Indigestion, Nausea, Rectal Pain and Vomiting. Male Genitourinary Present- Frequency and Urgency. Not Present- Blood in Urine, Change in Urinary Stream, Impotence, Nocturia, Painful Urination and Urine Leakage. Musculoskeletal Not Present- Back Pain, Joint Pain, Joint Stiffness, Muscle Pain, Muscle Weakness and Swelling of Extremities. Neurological Not Present- Decreased Memory, Fainting, Headaches, Numbness, Seizures, Tingling, Tremor, Trouble walking and Weakness. Psychiatric Not Present- Anxiety, Bipolar, Change in Sleep Pattern, Depression, Fearful and Frequent crying. Endocrine Not Present- Cold Intolerance, Excessive Hunger, Hair Changes, Heat Intolerance, Hot flashes and New  Diabetes. Hematology Present- Blood Thinners and Easy Bruising. Not Present- Excessive bleeding, Gland problems, HIV and Persistent Infections.  Vitals  Weight: 149 lb Height: 68in Body Surface Area: 1.8 m Body Mass Index: 22.66 kg/m  Temp.: 98.72F (Oral)  Pulse: 96 (Regular)  BP: 112/62(Sitting, Left Arm, Standard)     Physical Exam  General Mental Status-Alert. General Appearance-Consistent with stated age. Hydration-Well hydrated. Voice-Normal. Note: Well-dressed gentleman. looks quite fit for his age. Alert. Good insight. Mental status normal.   Head and Neck Head-normocephalic, atraumatic with no lesions or palpable masses. Trachea-midline. Thyroid Gland Characteristics - normal size and consistency.  Eye Eyeball - Bilateral-Extraocular movements intact. Sclera/Conjunctiva - Bilateral-No scleral icterus.  Chest and Lung Exam Chest and lung exam reveals -quiet, even and easy respiratory effort with no use of accessory muscles and on auscultation, normal breath sounds, no adventitious sounds and normal vocal resonance. Inspection Chest Wall - Normal. Back - normal.  Cardiovascular Cardiovascular examination reveals -normal heart sounds, regular rate and rhythm with no murmurs and normal pedal pulses bilaterally.  Abdomen Inspection Inspection of the abdomen reveals - No Hernias. Skin - Scar - no surgical scars. Palpation/Percussion Palpation and Percussion of the abdomen reveal - Soft, Non Tender, No Rebound tenderness, No Rigidity (guarding) and No hepatosplenomegaly. Auscultation Auscultation of the abdomen reveals - Bowel sounds normal.  Male Genitourinary Note: Exam is supine and standing. Large left inguinal hernia which is reducible in supine but not reducible when standing. No hernia on the right. No scrotal mass.   Neurologic Neurologic evaluation reveals -alert and oriented x 3 with no impairment of recent or remote  memory. Mental Status-Normal.  Musculoskeletal Normal Exam - Left-Upper Extremity Strength Normal and Lower Extremity Strength  Normal. Normal Exam - Right-Upper Extremity Strength Normal and Lower Extremity Strength Normal.  Lymphatic Head & Neck  General Head & Neck Lymphatics: Bilateral - Description - Normal. Axillary  General Axillary Region: Bilateral - Description - Normal. Tenderness - Non Tender. Femoral & Inguinal  Generalized Femoral & Inguinal Lymphatics: Bilateral - Description - Normal. Tenderness - Non Tender.    Assessment & Plan   LEFT INGUINAL HERNIA (K40.90)   You have a large left inguinal hernia. I was able to slowly reduce this in the office today You have been having increasing pain, and I think that eventually you will have a complication such as an incarceration which could require emergency surgery That would be problematic because you're anticoagulated on Plavix  I have advised elective repair of your left inguinal hernia with mesh and you agree  We will ask your cardiologist for risk assessment and clearance for surgery You'll need to stop the Plavix a full 5 days prior to the surgery if okay with your cardiologist I have discussed the indications, techniques, and risk of the surgery with you in detail Please read over the patient information booklet that I reviewed with you   ANTICOAGULATED (Z79.01) Impression: Plavix  PLEURAL EFFUSION DUE TO ANOTHER DISORDER (J90) Impression: Bilateral chronic pleural catheter. Dr. Prescott Watson.  HISTORY OF TIA (TRANSIENT ISCHEMIC ATTACK) AND STROKE (Z86.73)  POLYMYALGIA RHEUMATICA (M35.3)  HISTORY OF BPH (Z87.438)  HYPERLIPIDEMIA, MILD (E78.5) Impression: On simvastatin    Stuart Watson M. Dalbert Batman, M.D., [facility] General and Minimally invasive Surgery Breast and Colorectal Surgery Office:   [phone] Pager:   [phone]

## 2019-01-26 ENCOUNTER — Other Ambulatory Visit (HOSPITAL_COMMUNITY)
Admission: RE | Admit: 2019-01-26 | Discharge: 2019-01-26 | Disposition: A | Discharge status: Home / Self Care | Payer: Medicare Other | Source: Ambulatory Visit | Attending: General Surgery | Admitting: General Surgery

## 2019-01-26 DIAGNOSIS — Z1159 Encounter for screening for other viral diseases: Secondary | ICD-10-CM | POA: Insufficient documentation

## 2019-01-26 DIAGNOSIS — Z01812 Encounter for preprocedural laboratory examination: Secondary | ICD-10-CM | POA: Insufficient documentation

## 2019-01-26 LAB — SARS CORONAVIRUS 2 BY RT PCR (HOSPITAL ORDER, PERFORMED IN ~~LOC~~ HOSPITAL LAB): SARS Coronavirus 2: NEGATIVE

## 2019-01-27 ENCOUNTER — Encounter (HOSPITAL_COMMUNITY): Payer: Self-pay | Admitting: Certified Registered Nurse Anesthetist

## 2019-01-27 ENCOUNTER — Ambulatory Visit (HOSPITAL_COMMUNITY)
Admission: RE | Admit: 2019-01-27 | Discharge: 2019-01-27 | Disposition: A | Discharge status: Home / Self Care | Payer: Medicare Other | Attending: General Surgery | Admitting: General Surgery

## 2019-01-27 ENCOUNTER — Ambulatory Visit (HOSPITAL_COMMUNITY): Payer: Medicare Other | Admitting: Physician Assistant

## 2019-01-27 ENCOUNTER — Encounter (HOSPITAL_COMMUNITY)
Admission: RE | Disposition: A | Discharge status: Home / Self Care | Payer: Self-pay | Source: Home / Self Care | Attending: General Surgery

## 2019-01-27 ENCOUNTER — Other Ambulatory Visit: Payer: Self-pay

## 2019-01-27 ENCOUNTER — Ambulatory Visit (HOSPITAL_COMMUNITY): Payer: Medicare Other | Admitting: Anesthesiology

## 2019-01-27 DIAGNOSIS — M353 Polymyalgia rheumatica: Secondary | ICD-10-CM | POA: Diagnosis not present

## 2019-01-27 DIAGNOSIS — Z803 Family history of malignant neoplasm of breast: Secondary | ICD-10-CM | POA: Diagnosis not present

## 2019-01-27 DIAGNOSIS — Z86010 Personal history of colonic polyps: Secondary | ICD-10-CM | POA: Insufficient documentation

## 2019-01-27 DIAGNOSIS — Z818 Family history of other mental and behavioral disorders: Secondary | ICD-10-CM | POA: Diagnosis not present

## 2019-01-27 DIAGNOSIS — I2510 Atherosclerotic heart disease of native coronary artery without angina pectoris: Secondary | ICD-10-CM | POA: Diagnosis not present

## 2019-01-27 DIAGNOSIS — M5416 Radiculopathy, lumbar region: Secondary | ICD-10-CM | POA: Insufficient documentation

## 2019-01-27 DIAGNOSIS — K219 Gastro-esophageal reflux disease without esophagitis: Secondary | ICD-10-CM | POA: Insufficient documentation

## 2019-01-27 DIAGNOSIS — Z833 Family history of diabetes mellitus: Secondary | ICD-10-CM | POA: Insufficient documentation

## 2019-01-27 DIAGNOSIS — J90 Pleural effusion, not elsewhere classified: Secondary | ICD-10-CM | POA: Diagnosis present

## 2019-01-27 DIAGNOSIS — G8918 Other acute postprocedural pain: Secondary | ICD-10-CM | POA: Diagnosis not present

## 2019-01-27 DIAGNOSIS — Z87891 Personal history of nicotine dependence: Secondary | ICD-10-CM | POA: Diagnosis not present

## 2019-01-27 DIAGNOSIS — L605 Yellow nail syndrome: Secondary | ICD-10-CM | POA: Diagnosis present

## 2019-01-27 DIAGNOSIS — K4090 Unilateral inguinal hernia, without obstruction or gangrene, not specified as recurrent: Secondary | ICD-10-CM

## 2019-01-27 DIAGNOSIS — Z79899 Other long term (current) drug therapy: Secondary | ICD-10-CM | POA: Diagnosis not present

## 2019-01-27 DIAGNOSIS — Z8249 Family history of ischemic heart disease and other diseases of the circulatory system: Secondary | ICD-10-CM | POA: Diagnosis not present

## 2019-01-27 DIAGNOSIS — Z823 Family history of stroke: Secondary | ICD-10-CM | POA: Insufficient documentation

## 2019-01-27 DIAGNOSIS — Z8673 Personal history of transient ischemic attack (TIA), and cerebral infarction without residual deficits: Secondary | ICD-10-CM | POA: Diagnosis not present

## 2019-01-27 DIAGNOSIS — M1990 Unspecified osteoarthritis, unspecified site: Secondary | ICD-10-CM | POA: Insufficient documentation

## 2019-01-27 DIAGNOSIS — Z7952 Long term (current) use of systemic steroids: Secondary | ICD-10-CM | POA: Diagnosis not present

## 2019-01-27 DIAGNOSIS — Z8371 Family history of colonic polyps: Secondary | ICD-10-CM | POA: Insufficient documentation

## 2019-01-27 HISTORY — PX: INGUINAL HERNIA REPAIR: SHX194

## 2019-01-27 HISTORY — PX: INSERTION OF MESH: SHX5868

## 2019-01-27 HISTORY — DX: Unilateral inguinal hernia, without obstruction or gangrene, not specified as recurrent: K40.90

## 2019-01-27 SURGERY — REPAIR, HERNIA, INGUINAL, ADULT
Anesthesia: General | Laterality: Left

## 2019-01-27 MED ORDER — BUPIVACAINE-EPINEPHRINE 0.5% -1:200000 IJ SOLN
INTRAMUSCULAR | Status: DC | PRN
  Administered 2019-01-27: 30 mL

## 2019-01-27 MED ORDER — CEFAZOLIN SODIUM-DEXTROSE 2-4 GM/100ML-% IV SOLN
INTRAVENOUS | Status: AC
  Filled 2019-01-27: qty 100

## 2019-01-27 MED ORDER — ADULT MULTIVITAMIN W/MINERALS CH: 1.0000 | ORAL_TABLET | Freq: Every day | ORAL | Status: DC

## 2019-01-27 MED ORDER — BUPIVACAINE LIPOSOME 1.3 % IJ SUSP
20.0000 mL | Freq: Once | INTRAMUSCULAR | Status: DC
  Filled 2019-01-27: qty 20

## 2019-01-27 MED ORDER — ACETAMINOPHEN 500 MG PO TABS
ORAL_TABLET | ORAL | Status: AC
  Administered 2019-01-27: 1000 mg via ORAL
  Filled 2019-01-27: qty 2

## 2019-01-27 MED ORDER — BUPIVACAINE-EPINEPHRINE (PF) 0.5% -1:200000 IJ SOLN
INTRAMUSCULAR | Status: AC
  Filled 2019-01-27: qty 30

## 2019-01-27 MED ORDER — PROPOFOL 10 MG/ML IV BOLUS
INTRAVENOUS | Status: AC
  Filled 2019-01-27: qty 20

## 2019-01-27 MED ORDER — GABAPENTIN 300 MG PO CAPS
ORAL_CAPSULE | ORAL | Status: AC
  Filled 2019-01-27: qty 1

## 2019-01-27 MED ORDER — SODIUM CHLORIDE 0.9 % IV SOLN
INTRAVENOUS | Status: DC | PRN
  Administered 2019-01-27: 08:00:00 35 mL

## 2019-01-27 MED ORDER — LACTATED RINGERS IV SOLN: INTRAVENOUS | Status: DC

## 2019-01-27 MED ORDER — MIDAZOLAM HCL 2 MG/2ML IJ SOLN
INTRAMUSCULAR | Status: DC | PRN
  Administered 2019-01-27: 1 mg via INTRAVENOUS

## 2019-01-27 MED ORDER — FENTANYL CITRATE (PF) 100 MCG/2ML IJ SOLN: 25.0000 ug | INTRAMUSCULAR | Status: DC | PRN

## 2019-01-27 MED ORDER — ACETAMINOPHEN 650 MG RE SUPP: 650.0000 mg | RECTAL | Status: DC | PRN

## 2019-01-27 MED ORDER — PROPOFOL 10 MG/ML IV BOLUS
INTRAVENOUS | Status: DC | PRN
  Administered 2019-01-27: 110 mg via INTRAVENOUS

## 2019-01-27 MED ORDER — TRAMADOL HCL 50 MG PO TABS: 50.0000 mg | ORAL_TABLET | Freq: Four times a day (QID) | ORAL | Status: DC | PRN

## 2019-01-27 MED ORDER — POLYVINYL ALCOHOL 1.4 % OP SOLN: Freq: Two times a day (BID) | OPHTHALMIC | Status: DC | PRN

## 2019-01-27 MED ORDER — SUGAMMADEX SODIUM 200 MG/2ML IV SOLN
INTRAVENOUS | Status: DC | PRN
  Administered 2019-01-27: 200 mg via INTRAVENOUS

## 2019-01-27 MED ORDER — CHLORHEXIDINE GLUCONATE CLOTH 2 % EX PADS: 6.0000 | MEDICATED_PAD | Freq: Once | CUTANEOUS | Status: DC

## 2019-01-27 MED ORDER — ACETAMINOPHEN 500 MG PO TABS
1000.0000 mg | ORAL_TABLET | ORAL | Status: AC
  Administered 2019-01-27: 07:00:00 1000 mg via ORAL

## 2019-01-27 MED ORDER — CEFAZOLIN SODIUM-DEXTROSE 2-4 GM/100ML-% IV SOLN: 2.0000 g | Freq: Three times a day (TID) | INTRAVENOUS | Status: DC

## 2019-01-27 MED ORDER — TORSEMIDE 20 MG PO TABS
40.0000 mg | ORAL_TABLET | Freq: Two times a day (BID) | ORAL | Status: DC
  Filled 2019-01-27: qty 2

## 2019-01-27 MED ORDER — POTASSIUM CHLORIDE CRYS ER 20 MEQ PO TBCR: 40.0000 meq | EXTENDED_RELEASE_TABLET | Freq: Two times a day (BID) | ORAL | Status: DC

## 2019-01-27 MED ORDER — OXYCODONE HCL 5 MG/5ML PO SOLN: 5.0000 mg | Freq: Once | ORAL | Status: AC | PRN

## 2019-01-27 MED ORDER — CALCIUM CARBONATE ANTACID 500 MG PO CHEW: 1.0000 | CHEWABLE_TABLET | Freq: Every day | ORAL | Status: DC | PRN

## 2019-01-27 MED ORDER — DOXAZOSIN MESYLATE 8 MG PO TABS: 8.0000 mg | ORAL_TABLET | Freq: Every day | ORAL | Status: DC

## 2019-01-27 MED ORDER — SODIUM CHLORIDE 0.9% FLUSH: 3.0000 mL | Freq: Two times a day (BID) | INTRAVENOUS | Status: DC

## 2019-01-27 MED ORDER — OXYCODONE HCL 5 MG PO TABS
5.0000 mg | ORAL_TABLET | Freq: Once | ORAL | Status: AC | PRN
  Administered 2019-01-27: 5 mg via ORAL

## 2019-01-27 MED ORDER — LIDOCAINE 2% (20 MG/ML) 5 ML SYRINGE
INTRAMUSCULAR | Status: DC | PRN
  Administered 2019-01-27: 40 mg via INTRAVENOUS

## 2019-01-27 MED ORDER — PROMETHAZINE HCL 25 MG/ML IJ SOLN: 6.2500 mg | INTRAMUSCULAR | Status: DC | PRN

## 2019-01-27 MED ORDER — OXYCODONE HCL 5 MG PO TABS
ORAL_TABLET | ORAL | Status: AC
  Filled 2019-01-27: qty 1

## 2019-01-27 MED ORDER — ONDANSETRON HCL 4 MG/2ML IJ SOLN
INTRAMUSCULAR | Status: DC | PRN
  Administered 2019-01-27: 4 mg via INTRAVENOUS

## 2019-01-27 MED ORDER — SODIUM CHLORIDE 0.9% FLUSH: 3.0000 mL | INTRAVENOUS | Status: DC | PRN

## 2019-01-27 MED ORDER — OXYCODONE HCL 5 MG PO TABS: 5.0000 mg | ORAL_TABLET | ORAL | Status: DC | PRN

## 2019-01-27 MED ORDER — SENNA 8.6 MG PO TABS: 1.0000 | ORAL_TABLET | Freq: Two times a day (BID) | ORAL | Status: DC

## 2019-01-27 MED ORDER — ONDANSETRON 4 MG PO TBDP
4.0000 mg | ORAL_TABLET | Freq: Four times a day (QID) | ORAL | Status: DC | PRN
  Filled 2019-01-27: qty 1

## 2019-01-27 MED ORDER — MIDAZOLAM HCL 2 MG/2ML IJ SOLN
INTRAMUSCULAR | Status: AC
  Filled 2019-01-27: qty 2

## 2019-01-27 MED ORDER — SIMVASTATIN 20 MG PO TABS: 20.0000 mg | ORAL_TABLET | Freq: Every day | ORAL | Status: DC

## 2019-01-27 MED ORDER — LORATADINE 10 MG PO TABS: 10.0000 mg | ORAL_TABLET | Freq: Every day | ORAL | Status: DC

## 2019-01-27 MED ORDER — FENTANYL CITRATE (PF) 250 MCG/5ML IJ SOLN
INTRAMUSCULAR | Status: AC
  Filled 2019-01-27: qty 5

## 2019-01-27 MED ORDER — GABAPENTIN 300 MG PO CAPS
300.0000 mg | ORAL_CAPSULE | ORAL | Status: AC
  Administered 2019-01-27: 300 mg via ORAL

## 2019-01-27 MED ORDER — PANTOPRAZOLE SODIUM 40 MG PO TBEC: 40.0000 mg | DELAYED_RELEASE_TABLET | Freq: Every day | ORAL | Status: DC

## 2019-01-27 MED ORDER — CEFAZOLIN SODIUM-DEXTROSE 2-4 GM/100ML-% IV SOLN
2.0000 g | INTRAVENOUS | Status: AC
  Administered 2019-01-27: 2 g via INTRAVENOUS

## 2019-01-27 MED ORDER — 0.9 % SODIUM CHLORIDE (POUR BTL) OPTIME
TOPICAL | Status: DC | PRN
  Administered 2019-01-27: 1000 mL

## 2019-01-27 MED ORDER — HYDROMORPHONE HCL 1 MG/ML IJ SOLN: 1.0000 mg | INTRAMUSCULAR | Status: DC | PRN

## 2019-01-27 MED ORDER — FENTANYL CITRATE (PF) 250 MCG/5ML IJ SOLN
INTRAMUSCULAR | Status: DC | PRN
  Administered 2019-01-27 (×4): 50 ug via INTRAVENOUS

## 2019-01-27 MED ORDER — ACETAMINOPHEN 325 MG PO TABS: 650.0000 mg | ORAL_TABLET | ORAL | Status: DC | PRN

## 2019-01-27 MED ORDER — HYDROCODONE-ACETAMINOPHEN 5-325 MG PO TABS: 1.0000 | ORAL_TABLET | ORAL | Status: DC | PRN

## 2019-01-27 MED ORDER — SODIUM CHLORIDE 0.9 % IV SOLN: 250.0000 mL | INTRAVENOUS | Status: DC | PRN

## 2019-01-27 MED ORDER — LACTATED RINGERS IV SOLN
INTRAVENOUS | Status: DC | PRN
  Administered 2019-01-27: 07:00:00 via INTRAVENOUS

## 2019-01-27 MED ORDER — ONDANSETRON HCL 4 MG/2ML IJ SOLN: 4.0000 mg | Freq: Four times a day (QID) | INTRAMUSCULAR | Status: DC | PRN

## 2019-01-27 MED ORDER — ROCURONIUM BROMIDE 10 MG/ML (PF) SYRINGE
PREFILLED_SYRINGE | INTRAVENOUS | Status: DC | PRN
  Administered 2019-01-27: 40 mg via INTRAVENOUS
  Administered 2019-01-27: 50 mg via INTRAVENOUS

## 2019-01-27 MED ORDER — BUPIVACAINE-EPINEPHRINE (PF) 0.5% -1:200000 IJ SOLN
INTRAMUSCULAR | Status: DC | PRN
  Administered 2019-01-27: 30 mL

## 2019-01-27 SURGICAL SUPPLY — 43 items
BLADE CLIPPER SURG (BLADE) IMPLANT
CANISTER SUCT 3000ML PPV (MISCELLANEOUS) ×2 IMPLANT
CHLORAPREP W/TINT 26 (MISCELLANEOUS) ×2 IMPLANT
COVER SURGICAL LIGHT HANDLE (MISCELLANEOUS) ×2 IMPLANT
COVER WAND RF STERILE (DRAPES) ×1 IMPLANT
DERMABOND ADVANCED (GAUZE/BANDAGES/DRESSINGS) ×1
DERMABOND ADVANCED .7 DNX12 (GAUZE/BANDAGES/DRESSINGS) ×1 IMPLANT
DRAIN PENROSE 1/2X12 LTX STRL (WOUND CARE) ×1 IMPLANT
DRAPE LAPAROTOMY TRNSV 102X78 (DRAPES) ×2 IMPLANT
ELECT REM PT RETURN 9FT ADLT (ELECTROSURGICAL) ×2
ELECTRODE REM PT RTRN 9FT ADLT (ELECTROSURGICAL) ×1 IMPLANT
GLOVE BIOGEL PI IND STRL 6.5 (GLOVE) IMPLANT
GLOVE BIOGEL PI INDICATOR 6.5 (GLOVE) ×1
GLOVE EUDERMIC 7 POWDERFREE (GLOVE) ×2 IMPLANT
GLOVE SURG SS PI 6.5 STRL IVOR (GLOVE) ×1 IMPLANT
GOWN STRL REUS W/ TWL LRG LVL3 (GOWN DISPOSABLE) ×1 IMPLANT
GOWN STRL REUS W/ TWL XL LVL3 (GOWN DISPOSABLE) ×1 IMPLANT
GOWN STRL REUS W/TWL LRG LVL3 (GOWN DISPOSABLE) ×1
GOWN STRL REUS W/TWL XL LVL3 (GOWN DISPOSABLE) ×1
KIT BASIN OR (CUSTOM PROCEDURE TRAY) ×2 IMPLANT
KIT TURNOVER KIT B (KITS) ×2 IMPLANT
MESH ULTRAPRO 3X6 7.6X15CM (Mesh General) ×1 IMPLANT
NDL HYPO 25GX1X1/2 BEV (NEEDLE) ×1 IMPLANT
NEEDLE 22X1 1/2 (OR ONLY) (NEEDLE) ×1 IMPLANT
NEEDLE HYPO 25GX1X1/2 BEV (NEEDLE) ×2 IMPLANT
NS IRRIG 1000ML POUR BTL (IV SOLUTION) ×2 IMPLANT
PACK GENERAL/GYN (CUSTOM PROCEDURE TRAY) ×2 IMPLANT
PAD ARMBOARD 7.5X6 YLW CONV (MISCELLANEOUS) ×2 IMPLANT
PENCIL SMOKE EVACUATOR (MISCELLANEOUS) ×2 IMPLANT
SUT MNCRL AB 4-0 PS2 18 (SUTURE) ×2 IMPLANT
SUT PROLENE 2 0 CT2 30 (SUTURE) ×6 IMPLANT
SUT SILK 2 0 (SUTURE) ×1
SUT SILK 2 0 SH (SUTURE) IMPLANT
SUT SILK 2-0 18XBRD TIE 12 (SUTURE) ×1 IMPLANT
SUT VIC AB 2-0 BRD 54 (SUTURE) ×2 IMPLANT
SUT VIC AB 2-0 CT1 27 (SUTURE) ×2
SUT VIC AB 2-0 CT1 TAPERPNT 27 (SUTURE) ×1 IMPLANT
SUT VIC AB 3-0 SH 27 (SUTURE) ×1
SUT VIC AB 3-0 SH 27XBRD (SUTURE) ×1 IMPLANT
SYR 20CC LL (SYRINGE) ×1 IMPLANT
SYR CONTROL 10ML LL (SYRINGE) ×2 IMPLANT
TOWEL GREEN STERILE (TOWEL DISPOSABLE) ×2 IMPLANT
TOWEL GREEN STERILE FF (TOWEL DISPOSABLE) ×2 IMPLANT

## 2019-01-27 NOTE — Transfer of Care (Signed)
Immediate Anesthesia Transfer of Care Note  Patient: Stuart Watson  Procedure(s) Performed: OPEN REPAIR LEFT INGUINAL HERNIA WITH MESH (Left ) Insertion Of Mesh (Left )  Patient Location: PACU  Anesthesia Type:General  Level of Consciousness: awake, alert  and patient cooperative  Airway & Oxygen Therapy: Patient Spontanous Breathing  Post-op Assessment: Report given to RN and Post -op Vital signs reviewed and stable  Post vital signs: Reviewed and stable  Last Vitals:  Vitals Value Taken Time  BP    Temp    Pulse    Resp    SpO2      Last Pain:  Vitals:   01/27/19 0707  TempSrc:   PainSc: 0-No pain      Patients Stated Pain Goal: 4 (75/44/92 0100)  Complications: No apparent anesthesia complications

## 2019-01-27 NOTE — Discharge Instructions (Signed)
You may restart your Plavix tomorrow if there is no significant bleeding or swelling        [facility] _______[facility]  INGUINAL HERNIA REPAIR: POST OP INSTRUCTIONS  Always review your discharge instruction sheet given to you by the facility where your surgery was performed. IF YOU HAVE DISABILITY OR FAMILY LEAVE FORMS, YOU MUST BRING THEM TO THE OFFICE FOR PROCESSING.   DO NOT GIVE THEM TO YOUR DOCTOR.  1. A  prescription for pain medication may be given to you upon discharge.  Take your pain medication as prescribed, if needed.  If narcotic pain medicine is not needed, then you may take acetaminophen (Tylenol) or ibuprofen (Advil) as needed. 2. Take your usually prescribed medications unless otherwise directed. If you need a refill on your pain medication, please contact your pharmacy.  They will contact our office to request authorization. Prescriptions will not be filled after 5 pm or on week-ends. 3. You should follow a light diet the first 24 hours after arrival home, such as soup and crackers, etc.  Be sure to include lots of fluids daily.  Resume your normal diet the day after surgery. 4.Most patients will experience some swelling and bruising  in the groin and scrotum.  Ice packs and reclining will help.  Swelling and bruising can take several days to resolve.  6. It is common to experience some constipation if taking pain medication after surgery.  Increasing fluid intake and taking a stool softener (such as Colace) will usually help or prevent this problem from occurring.  A mild laxative (Milk of Magnesia or Miralax) should be taken according to package directions if there are no bowel movements after 48 hours. 7. Unless discharge instructions indicate otherwise, you may remove your bandages 24-48 hours after surgery, and you may shower at that time.  You may have steri-strips (small skin tapes) in place directly over the incision.  These strips should be left on the  skin for 7-10 days.  If your surgeon used skin glue on the incision, you may shower in 24 hours.  The glue will flake off over the next 2-3 weeks.  Any sutures or staples will be removed at the office during your follow-up visit. 8. ACTIVITIES:  You may resume regular (light) daily activities beginning the next day--such as daily self-care, walking, climbing stairs--gradually increasing activities as tolerated.  You may have sexual intercourse when it is comfortable.  Refrain from any heavy lifting or straining until approved by your doctor.  a.You may drive when you are no longer taking prescription pain medication, you can comfortably wear a seatbelt, and you can safely maneuver your car and apply brakes. b.RETURN TO WORK:   _____________________________________________  9.You should see your doctor in the office for a follow-up appointment approximately 2-3 weeks after your surgery.  Make sure that you call for this appointment within a day or two after you arrive home to insure a convenient appointment time. 10.OTHER INSTRUCTIONS: _________________________    _____________________________________  WHEN TO CALL YOUR DOCTOR: 1. Fever over 101.0 2. Inability to urinate 3. Nausea and/or vomiting 4. Extreme swelling or bruising 5. Continued bleeding from incision. 6. Increased pain, redness, or drainage from the incision  The clinic staff is available to answer your questions during regular business hours.  Please dont hesitate to call and ask to speak to one of the nurses for clinical concerns.  If you have a medical emergency, go to the nearest emergency room or call 911.  A surgeon from [facility] is always on call at the hospital   [address], [place], [place], [place]  [address] ?  [address], [place], [place]   [address] [phone] ? [phone] ? FAX [phone] Web site: [email or web address]           Managing Your Pain  After Surgery Without Opioids    Thank you for participating in our program to help patients manage their pain after surgery without opioids. This is part of our effort to provide you with the best care possible, without exposing you or your family to the risk that opioids pose.  What pain can I expect after surgery? You can expect to have some pain after surgery. This is normal. The pain is typically worse the day after surgery, and quickly begins to get better. Many studies have found that many patients are able to manage their pain after surgery with Over-the-Counter (OTC) medications such as Tylenol and Motrin. If you have a condition that does not allow you to take Tylenol or Motrin, notify your surgical team.  How will I manage my pain? The best strategy for controlling your pain after surgery is around the clock pain control with Tylenol (acetaminophen) and Motrin (ibuprofen or Advil). Alternating these medications with each other allows you to maximize your pain control. In addition to Tylenol and Motrin, you can use heating pads or ice packs on your incisions to help reduce your pain.  How will I alternate your regular strength over-the-counter pain medication? You will take a dose of pain medication every three hours. ; Start by taking 650 mg of Tylenol (2 pills of 325 mg) ; 3 hours later take 600 mg of Motrin (3 pills of 200 mg) ; 3 hours after taking the Motrin take 650 mg of Tylenol ; 3 hours after that take 600 mg of Motrin.   - 1 -  See example - if your first dose of Tylenol is at 12:00 PM   12:00 PM Tylenol 650 mg (2 pills of 325 mg)  3:00 PM Motrin 600 mg (3 pills of 200 mg)  6:00 PM Tylenol 650 mg (2 pills of 325 mg)  9:00 PM Motrin 600 mg (3 pills of 200 mg)  Continue alternating every 3 hours   We recommend that you follow this schedule around-the-clock for at least 3 days after surgery, or until you feel that it is no longer needed. Use the table on the last page  of this handout to keep track of the medications you are taking. Important: Do not take more than 3000mg  of Tylenol or 3200mg  of Motrin in a 24-hour period. Do not take ibuprofen/Motrin if you have a history of bleeding stomach ulcers, severe kidney disease, &/or actively taking a blood thinner  What if I still have pain? If you have pain that is not controlled with the over-the-counter pain medications (Tylenol and Motrin or Advil) you might have what we call breakthrough pain. You will receive a prescription for a small amount of an opioid pain medication such as Oxycodone, Tramadol, or Tylenol with Codeine. Use these opioid pills in the first 24 hours after surgery if you have breakthrough pain. Do not take more than 1 pill every 4-6 hours.  If you still have uncontrolled pain after using all opioid pills, don't hesitate to call our staff using the number provided. We will help make sure you are managing your pain in the best way possible, and if  necessary, we can provide a prescription for additional pain medication.   Day 1    Time  Name of Medication Number of pills taken  Amount of Acetaminophen  Pain Level   Comments  AM PM       AM PM       AM PM       AM PM       AM PM       AM PM       AM PM       AM PM       Total Daily amount of Acetaminophen Do not take more than  3,000 mg per day      Day 2    Time  Name of Medication Number of pills taken  Amount of Acetaminophen  Pain Level   Comments  AM PM       AM PM       AM PM       AM PM       AM PM       AM PM       AM PM       AM PM       Total Daily amount of Acetaminophen Do not take more than  3,000 mg per day      Day 3    Time  Name of Medication Number of pills taken  Amount of Acetaminophen  Pain Level   Comments  AM PM       AM PM       AM PM       AM PM          AM PM       AM PM       AM PM       AM PM       Total Daily amount of Acetaminophen Do not take more than  3,000 mg  per day      Day 4    Time  Name of Medication Number of pills taken  Amount of Acetaminophen  Pain Level   Comments  AM PM       AM PM       AM PM       AM PM       AM PM       AM PM       AM PM       AM PM       Total Daily amount of Acetaminophen Do not take more than  3,000 mg per day      Day 5    Time  Name of Medication Number of pills taken  Amount of Acetaminophen  Pain Level   Comments  AM PM       AM PM       AM PM       AM PM       AM PM       AM PM       AM PM       AM PM       Total Daily amount of Acetaminophen Do not take more than  3,000 mg per day       Day 6    Time  Name of Medication Number of pills taken  Amount of Acetaminophen  Pain Level  Comments  AM PM       AM PM  AM PM       AM PM       AM PM       AM PM       AM PM       AM PM       Total Daily amount of Acetaminophen Do not take more than  3,000 mg per day      Day 7    Time  Name of Medication Number of pills taken  Amount of Acetaminophen  Pain Level   Comments  AM PM       AM PM       AM PM       AM PM       AM PM       AM PM       AM PM       AM PM       Total Daily amount of Acetaminophen Do not take more than  3,000 mg per day        For additional information about how and where to safely dispose of unused opioid medications - [employer]  Disclaimer: This document contains information and/or instructional materials adapted from [facility] for the typical patient with your condition. It does not replace medical advice from your health care provider because your experience may differ from that of the typical patient. Talk to your health care provider if you have any questions about this document, your condition or your treatment plan. Adapted from [place]

## 2019-01-27 NOTE — Op Note (Signed)
Patient Name:           Stuart Watson   Date of Surgery:        01/27/2019  Pre op Diagnosis:      Left inguinal hernia  Post op Diagnosis:    Left inguinal hernia  Procedure:                 Open repair left inguinal hernia, implantation of ultra Pro mesh  Surgeon:                     Edsel Petrin. Dalbert Batman, M.D., FACS  Assistant:                      OR staff  Operative Indications:   This is a very pleasant 85 year old man, referred by [name] for evaluation and management of a symptomatic left inguinal hernia. His cardiologist is Dr. [name] His thoracic surgeon is Dr. [name].      He's had a progressive left inguinal hernia for 6 months. It has never been incarcerated but is he is having progressively more pain. This is impairing any heavy lifting now. He moved to [facility] about 6 months ago and was doing a lot of heavy lifting  when it started. No prior history of hernia or hernia surgery      Comorbidities include chronic bilateral pleural catheters since July 2019. 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 His wife drains the catheters every other day. TIA in 2008. [name] put him on Plavix and simvastatin. He is now followed by Dr. [name]. Also followed by Dr. [name] from cardiology. History of polymyalgia rheumatica.. Has been seen by Dr. [name] in the past. BPH.      We talked about inguinal hernia repair. We talked about laparoscopic and open repair. Because of the size of the hernia and think that he would do better with open repair with TAPP block and exparel.  Think I would keep him at [facility] overnight for observation.      He will need to stop his Plavix 5 days preop. He has completed a recent cardiac risk assessment by his cardiologist. I discussed  the indications, details, techniques, and numerous risk of the surgery with him. He agrees with this plan.      Operative Findings:       He had a large indirect  left inguinal hernia.  The sac was very large but was completely empty.  We were able to resect the indirect sac all the way back to the level of the internal ring.  I inserted my finger through the sac into the peritoneal cavity.  Femoral artery was soft.  He did not have a femoral hernia.  There was a little bit of ascites.  Procedure in Detail:          Following the induction of general endotracheal anesthesia the patient's abdomen and genitalia were prepped and draped in a sterile fashion.  Intravenous antibiotics were given.  Surgical timeout was performed.  A 50% solution of Exparel was infiltrated into all of the tissue layers.      Transverse incision was made overlying the left inguinal canal.  Dissection was carried down to the external oblique.  The external oblique was cleaned off and incised in the direction of its fibers, opening up the external inguinal ring.  The external oblique was dissected away from the underlying  tissues and self-retaining retractors were placed.  One sensory nerve was clamped divided and ligated with a 2-0 silk tie.  The cord structures were mobilized and encircled with a Penrose drain.  Cremasteric muscle fibers were identified and divided.  Cord structures were preserved.  A lipoma was dissected away from the cord structures all the way back to the internal ring, clamped, divided and ligated with 2-0 Vicryl tie.  The indirect sac was carefully dissected away from the cord structures.  The indirect sac was then opened and inspected.  The sac was then twisted and suture ligated at the level of the internal ring with a suture ligature of 2-0 Vicryl.  The redundant sac was resected and discarded.  The internal ring was closed laterally with a figure-of-eight suture of 2-0 Vicryl.  The wound was irrigated.  He did not have a direct hernia.       The floor of the inguinal canal was repaired and reinforced with an onlay graft of ultra pro mesh.  A 3 inch x 6 inch piece of mesh  was brought to the operative field and trimmed at the corners to fit the anatomy of the wound.  The mesh was sutured in place with running sutures of 2-0 Prolene and  interrupted mattress sutures of 2-0 Prolene.  The mesh was sutured so as to generously overlap the fascia at the pubic tubercle, then along the inguinal ligament inferiorly.  Medially, superiorly, and superior laterally several mattress sutures of 2-0 Prolene were placed.  The mesh was incised laterally so as to wrap around the cord structures at the internal ring.  The tails of the mesh were overlapped laterally and further Prolene sutures were placed.  This provided very secure coverage and repair both medial and lateral to the internal ring but allowed an adequate fingertip opening for the cord structures.       The wound was irrigated.  There was no bleeding.  The external oblique was closed with a running suture of 2-0 Vicryl, placing the cord structures deep to the external oblique.  Scarpa's fascia was closed with a running suture of 3-0 Vicryl and the skin closed with a running subcuticular 4-0 Monocryl and Dermabond.  Patient tolerated the procedure well and was taken to PACU in stable condition.  EBL 10 cc.  Counts correct.  Complications none.    Addendum: I logged onto the PMP aware website and reviewed his prescription medication history.     Edsel Petrin. Dalbert Batman, M.D., FACS General and Minimally Invasive Surgery Breast and Colorectal Surgery  01/27/2019 8:52 AM

## 2019-01-27 NOTE — Anesthesia Procedure Notes (Signed)
Anesthesia Regional Block: TAP block   Pre-Anesthetic Checklist: ,, timeout performed, Correct Patient, Correct Site, Correct Laterality, Correct Procedure, Correct Position, site marked, Risks and benefits discussed, pre-op evaluation,  At surgeon's request and post-op pain management  Laterality: Left  Prep: Maximum Sterile Barrier Precautions used, chloraprep       Needles:  Injection technique: Single-shot  Needle Type: Echogenic Stimulator Needle     Needle Length: 9cm  Needle Gauge: 21     Additional Needles:   Narrative:  Start time: 01/27/2019 7:07 AM End time: 01/27/2019 7:09 AM Injection made incrementally with aspirations every 5 mL. Anesthesiologist: Brennan Bailey, MD  Additional Notes: Risks, benefits, and alternative discussed. Patient gave consent for procedure. Patient prepped and draped in sterile fashion. Sedation administered, patient remains easily responsive to voice. Relevant anatomy identified with ultrasound guidance. Local anesthetic given in 5cc increments with no signs or symptoms of intravascular injection. No pain or paraesthesias with injection. Patient monitored throughout procedure with signs of LAST or immediate complications. Tolerated well. Ultrasound image placed in chart. Tawny Asal, MD

## 2019-01-27 NOTE — Anesthesia Postprocedure Evaluation (Signed)
Anesthesia Post Note  Patient: Stuart Watson  Procedure(s) Performed: OPEN REPAIR LEFT INGUINAL HERNIA WITH MESH (Left ) Insertion Of Mesh (Left )     Patient location during evaluation: PACU Anesthesia Type: General Level of consciousness: awake and alert Pain management: pain level controlled Vital Signs Assessment: post-procedure vital signs reviewed and stable Respiratory status: spontaneous breathing, nonlabored ventilation and respiratory function stable Cardiovascular status: blood pressure returned to baseline and stable Postop Assessment: no apparent nausea or vomiting Anesthetic complications: no    Last Vitals:  Vitals:   01/27/19 0945 01/27/19 1014  BP: (!) 117/57 (!) 128/55  Pulse: 73 70  Resp: 16 16  Temp: 36.5 C 36.5 C  SpO2: 97% 100%    Last Pain:  Vitals:   01/27/19 1015  TempSrc:   PainSc: [place]

## 2019-01-27 NOTE — Plan of Care (Signed)
Pt doing well. Pt has been OOB ambulating in the hallway with no difficulty. Pt has voided x 2 and is tolerating solid food. Pt's pain level is minimal. Pt given D/C instructions with verbal understanding. Pt's incision is clean and dry with no sign of infection. Pt's IV was removed prior to D/C. Pt D/C'd home via wheelchair per MD order. Pt is stable @ D/C and has no other needs at this time. Holli Humbles, RN

## 2019-01-27 NOTE — Anesthesia Procedure Notes (Signed)
Procedure Name: Intubation Date/Time: 01/27/2019 7:41 AM Performed by: Elayne Snare, CRNA Pre-anesthesia Checklist: Patient identified, Emergency Drugs available, Suction available and Patient being monitored Patient Re-evaluated:Patient Re-evaluated prior to induction Oxygen Delivery Method: Circle System Utilized Preoxygenation: Pre-oxygenation with 100% oxygen Induction Type: IV induction Ventilation: Mask ventilation without difficulty Laryngoscope Size: Mac and 4 Grade View: Grade I Tube type: Oral Tube size: 7.5 mm Number of attempts: 1 Airway Equipment and Method: Stylet Placement Confirmation: ETT inserted through vocal cords under direct vision,  positive ETCO2 and breath sounds checked- equal and bilateral Secured at: 23 cm Tube secured with: Tape Dental Injury: Teeth and Oropharynx as per pre-operative assessment

## 2019-01-27 NOTE — Interval H&P Note (Signed)
History and Physical Interval Note:  01/27/2019 6:17 AM  Stuart Watson  has presented today for surgery, with the diagnosis of painful left inguinal hernia.  The various methods of treatment have been discussed with the patient and family. After consideration of risks, benefits and other options for treatment, the patient has consented to  Procedure(s) with comments: OPEN REPAIR LEFT INGUINAL HERNIA WITH MESH (Left) - GENERAL AND TAP BLOCK ANESTHESIA as a surgical intervention.  The patient's history has been reviewed, patient examined, no change in status, stable for surgery.  I have reviewed the patient's chart and labs.  Questions were answered to the patient's satisfaction.     Adin Hector

## 2019-01-28 ENCOUNTER — Encounter (HOSPITAL_COMMUNITY): Payer: Self-pay | Admitting: General Surgery

## 2019-01-29 ENCOUNTER — Encounter (HOSPITAL_COMMUNITY): Payer: Self-pay | Admitting: General Surgery

## 2019-02-04 ENCOUNTER — Ambulatory Visit: Payer: Medicare Other | Admitting: Cardiothoracic Surgery

## 2019-02-04 DIAGNOSIS — J918 Pleural effusion in other conditions classified elsewhere: Secondary | ICD-10-CM | POA: Diagnosis not present

## 2019-02-17 ENCOUNTER — Other Ambulatory Visit: Payer: Self-pay | Admitting: Thoracic Surgery (Cardiothoracic Vascular Surgery)

## 2019-02-17 ENCOUNTER — Other Ambulatory Visit: Payer: Self-pay | Admitting: Cardiothoracic Surgery

## 2019-02-17 DIAGNOSIS — J90 Pleural effusion, not elsewhere classified: Secondary | ICD-10-CM

## 2019-02-18 ENCOUNTER — Ambulatory Visit
Admission: RE | Admit: 2019-02-18 | Discharge: 2019-02-18 | Disposition: A | Discharge status: Home / Self Care | Payer: Medicare Other | Source: Ambulatory Visit | Attending: Cardiothoracic Surgery | Admitting: Cardiothoracic Surgery

## 2019-02-18 ENCOUNTER — Ambulatory Visit (INDEPENDENT_AMBULATORY_CARE_PROVIDER_SITE_OTHER): Payer: Medicare Other | Admitting: Cardiothoracic Surgery

## 2019-02-18 ENCOUNTER — Other Ambulatory Visit: Payer: Self-pay

## 2019-02-18 ENCOUNTER — Encounter: Payer: Self-pay | Admitting: Cardiothoracic Surgery

## 2019-02-18 VITALS — BP 111/57 | HR 76 | Temp 97.7°F | Resp 20 | Ht 66.0 in | Wt 147.0 lb

## 2019-02-18 DIAGNOSIS — L605 Yellow nail syndrome: Secondary | ICD-10-CM | POA: Diagnosis not present

## 2019-02-18 DIAGNOSIS — I2510 Atherosclerotic heart disease of native coronary artery without angina pectoris: Secondary | ICD-10-CM | POA: Diagnosis not present

## 2019-02-18 DIAGNOSIS — R600 Localized edema: Secondary | ICD-10-CM | POA: Diagnosis not present

## 2019-02-18 DIAGNOSIS — J90 Pleural effusion, not elsewhere classified: Secondary | ICD-10-CM

## 2019-02-18 DIAGNOSIS — K219 Gastro-esophageal reflux disease without esophagitis: Secondary | ICD-10-CM | POA: Diagnosis not present

## 2019-02-18 DIAGNOSIS — E7800 Pure hypercholesterolemia, unspecified: Secondary | ICD-10-CM | POA: Diagnosis not present

## 2019-02-18 DIAGNOSIS — M353 Polymyalgia rheumatica: Secondary | ICD-10-CM | POA: Diagnosis not present

## 2019-02-18 DIAGNOSIS — I2584 Coronary atherosclerosis due to calcified coronary lesion: Secondary | ICD-10-CM

## 2019-02-18 DIAGNOSIS — Z23 Encounter for immunization: Secondary | ICD-10-CM | POA: Diagnosis not present

## 2019-02-18 DIAGNOSIS — I700 Atherosclerosis of aorta: Secondary | ICD-10-CM | POA: Diagnosis not present

## 2019-02-18 DIAGNOSIS — G459 Transient cerebral ischemic attack, unspecified: Secondary | ICD-10-CM | POA: Diagnosis not present

## 2019-02-18 NOTE — Progress Notes (Signed)
PCP is Wenda Low, MD Referring Provider is Lauraine Rinne, NP  Chief Complaint  Patient presents with  . Pleural Effusion    f/u with CXR    HPI: Patient returns for scheduled office visit for bilateral Pleurx catheters placed several months ago.  Patient has recurrent effusions from "yellow nail syndrome".  He drains the left-sided Pleurx catheter 3 days a week in the right sided Pleurx catheter twice a week.  Drainage per week is about a liter on the left and 800 cc on the right.  Chest x-ray today shows minimal bilateral pleural effusions with blunting of the costophrenic angle, Pleurx catheters in good position.  He is scheduled to see his heart failure cardiologist, Dr. [name] in the next week.   Past Medical History:  Diagnosis Date  . Actinic keratoses   . Arthritis   . Chronic bilateral pleural effusions   . Chronic sinusitis   . Colon polyps   . Dyspnea 07/13/2013   BNP normal  . Edema 07/13/2013  . GERD (gastroesophageal reflux disease)    omeprazole 1-2 times daily  . H/O TIA (transient ischemic attack) and stroke    on statins and plavix -saw Dr.Sethi in the past  . Headache   . History of BPH   . History of TIA (transient ischemic attack) 07/13/2013  . Left inguinal hernia   . Left inguinal hernia 01/27/2019  . Lumbar radiculopathy   . Need for shingles vaccine   . Optic neuritis 10/2015   left eye  . Perennial allergic rhinitis   . Polymyalgia rheumatica ([place]) 07/13/2013   Daily prednisone  . Restless leg syndrome   . Retinal disease    right eye since birth   . Shingles   . Temporal arteritis ([place]) 10/2015  . Wears glasses   . 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 BRAVO [place] STUDY N/A 04/18/2016   Procedure: BRAVO [place];  Surgeon: Wilford Corner, MD;  Location: [facility];  Service: Endoscopy;  Laterality: N/A;  . CHEST TUBE INSERTION Right 02/13/2018   Procedure: INSERTION PLEURAL DRAINAGE  CATHETER;  Surgeon: Ivin Poot, MD;  Location: [place];  Service: Thoracic;  Laterality: Right;  . COLONOSCOPY    . drropy eyelid surgery    . ESOPHAGOGASTRODUODENOSCOPY (EGD) WITH PROPOFOL N/A 04/18/2016   Procedure: ESOPHAGOGASTRODUODENOSCOPY (EGD) WITH PROPOFOL;  Surgeon: Wilford Corner, MD;  Location: [facility];  Service: Endoscopy;  Laterality: N/A;  . INGUINAL HERNIA REPAIR Left 01/27/2019   Procedure: OPEN REPAIR LEFT INGUINAL HERNIA WITH MESH;  Surgeon: Fanny Skates, MD;  Location: [place];  Service: General;  Laterality: Left;  GENERAL AND TAP BLOCK ANESTHESIA  . INSERTION OF MESH Left 01/27/2019   Procedure: Insertion Of Mesh;  Surgeon: Fanny Skates, MD;  Location: [place];  Service: General;  Laterality: Left;  . IR THORACENTESIS ASP PLEURAL SPACE W/IMG GUIDE  02/04/2018  . IR THORACENTESIS ASP PLEURAL SPACE W/IMG GUIDE  02/06/2018  . RIGHT/LEFT HEART CATH AND CORONARY ANGIOGRAPHY N/A 06/24/2018   Procedure: RIGHT/LEFT HEART CATH AND CORONARY ANGIOGRAPHY;  Surgeon: Jolaine Artist, MD;  Location: [facility];  Service: Cardiovascular;  Laterality: N/A;  . VASECTOMY      Family History  Problem Relation Age of Onset  . Stroke Mother   . Coronary artery disease Mother   . Diabetes Mother   . Breast cancer Mother   . Heart attack Father   . Coronary  artery disease Father   . Diabetes Father   . Graves' disease Sister     Social History Social History   Tobacco Use  . Smoking status: Former Smoker    Packs/day: 1.00    Years: 15.00    Pack years: 15.00    Types: Cigarettes    Quit date: 07/23/1975    Years since quitting: 43.6  . Smokeless tobacco: Never Used  Substance Use Topics  . Alcohol use: Yes    Alcohol/week: 7.0 standard drinks    Types: 7 Glasses of wine per week    Comment: 1 glass red wine daily  . Drug use: No    Current Outpatient Medications  Medication Sig Dispense Refill  . acetaminophen (TYLENOL) 500 MG tablet Take 500 mg by  mouth daily as needed for moderate pain or headache.    . calcium carbonate (TUMS - DOSED IN MG ELEMENTAL CALCIUM) 500 MG chewable tablet Chew 1 tablet by mouth daily as needed for indigestion.    . cetirizine (ZYRTEC) 10 MG tablet Take 10 mg by mouth daily. As needed for allergies    . clopidogrel (PLAVIX) 75 MG tablet Take 75 mg by mouth daily.     . diphenhydrAMINE (BENADRYL) 25 MG tablet Take 25 mg by mouth daily as needed for allergies.    Marland Kitchen doxazosin (CARDURA) 8 MG tablet Take 8 mg by mouth daily.    . Multiple Vitamins-Minerals (MULTIVITAMIN ADULT PO) Take 1 tablet by mouth daily.    . Omega-3 Fatty Acids (FISH OIL PO) Take 2 capsules by mouth daily.    . pantoprazole (PROTONIX) 40 MG tablet Take 40 mg by mouth daily.    Vladimir Faster Glycol-Propyl Glycol (SYSTANE OP) Place 1 drop into both eyes 2 (two) times daily as needed (dry eyes).    . potassium chloride SA (K-DUR) 20 MEQ tablet Take 3 tablets (60 mEq total) by mouth every morning AND 2 tablets (40 mEq total) every evening. (Patient taking differently: Take 60 mEq total by mouth every morning AND 40 mEq every evening.) 460 tablet 3  . simvastatin (ZOCOR) 20 MG tablet Take 20 mg by mouth daily.    Marland Kitchen torsemide (DEMADEX) 20 MG tablet Take 2 tablets (40 mg total) by mouth 2 (two) times daily. 360 tablet 3   No current facility-administered medications for this visit.     No Known Allergies  Review of Systems  Patient recently had a left inguinal hernia repair by Dr. Dalbert Batman and has recovered well. Patient's weights remained stable around 145 pounds. No shortness of breath or dry cough.  BP (!) 111/57   Pulse 76   Temp 97.7 F (36.5 C) (Skin)   Resp 20   Ht 5\' 6"  (1.676 m)   Wt 147 lb (66.7 kg)   SpO2 95% Comment: RA  BMI 23.73 kg/m  Physical Exam      Exam    General- alert and comfortable    Neck- no JVD, no cervical adenopathy palpable, no carotid bruit   Lungs- clear without rales, wheezes   Cor- regular rate and  rhythm, no murmur , gallop   Abdomen- soft, non-tender   Extremities - warm, non-tender, minimal edema   Neuro- oriented, appropriate, no focal weakness   Diagnostic Tests: Chest x-ray performed today personally viewed.  No significant pleural effusions.  Pleurx catheters are in good position. Impression: Pleurx catheters providing drainage of pleural spaces and preventing shortness of breath.  Continue current drainage schedule  Plan: Return  with chest x-ray in 6 weeks.  Len Childs, MD [facility] [phone]

## 2019-02-23 ENCOUNTER — Other Ambulatory Visit: Payer: Self-pay

## 2019-02-23 ENCOUNTER — Ambulatory Visit (HOSPITAL_COMMUNITY)
Admission: RE | Admit: 2019-02-23 | Discharge: 2019-02-23 | Disposition: A | Discharge status: Home / Self Care | Payer: Medicare Other | Source: Ambulatory Visit | Attending: Internal Medicine | Admitting: Internal Medicine

## 2019-02-23 ENCOUNTER — Encounter (HOSPITAL_COMMUNITY): Payer: Self-pay | Admitting: Internal Medicine

## 2019-02-23 VITALS — BP 126/44 | HR 68 | Wt 151.0 lb

## 2019-02-23 DIAGNOSIS — L605 Yellow nail syndrome: Secondary | ICD-10-CM

## 2019-02-23 DIAGNOSIS — M315 Giant cell arteritis with polymyalgia rheumatica: Secondary | ICD-10-CM | POA: Diagnosis not present

## 2019-02-23 DIAGNOSIS — Z87891 Personal history of nicotine dependence: Secondary | ICD-10-CM | POA: Diagnosis not present

## 2019-02-23 DIAGNOSIS — Z9181 History of falling: Secondary | ICD-10-CM | POA: Insufficient documentation

## 2019-02-23 DIAGNOSIS — J90 Pleural effusion, not elsewhere classified: Secondary | ICD-10-CM | POA: Insufficient documentation

## 2019-02-23 DIAGNOSIS — Z7902 Long term (current) use of antithrombotics/antiplatelets: Secondary | ICD-10-CM | POA: Insufficient documentation

## 2019-02-23 DIAGNOSIS — Z833 Family history of diabetes mellitus: Secondary | ICD-10-CM | POA: Diagnosis not present

## 2019-02-23 DIAGNOSIS — Z823 Family history of stroke: Secondary | ICD-10-CM | POA: Diagnosis not present

## 2019-02-23 DIAGNOSIS — I2510 Atherosclerotic heart disease of native coronary artery without angina pectoris: Secondary | ICD-10-CM | POA: Diagnosis not present

## 2019-02-23 DIAGNOSIS — I471 Supraventricular tachycardia: Secondary | ICD-10-CM | POA: Insufficient documentation

## 2019-02-23 DIAGNOSIS — Z8673 Personal history of transient ischemic attack (TIA), and cerebral infarction without residual deficits: Secondary | ICD-10-CM | POA: Insufficient documentation

## 2019-02-23 DIAGNOSIS — Z803 Family history of malignant neoplasm of breast: Secondary | ICD-10-CM | POA: Diagnosis not present

## 2019-02-23 DIAGNOSIS — I509 Heart failure, unspecified: Secondary | ICD-10-CM | POA: Diagnosis not present

## 2019-02-23 DIAGNOSIS — Z79899 Other long term (current) drug therapy: Secondary | ICD-10-CM | POA: Diagnosis not present

## 2019-02-23 DIAGNOSIS — N400 Enlarged prostate without lower urinary tract symptoms: Secondary | ICD-10-CM | POA: Diagnosis not present

## 2019-02-23 DIAGNOSIS — I472 Ventricular tachycardia: Secondary | ICD-10-CM | POA: Diagnosis not present

## 2019-02-23 DIAGNOSIS — Z8249 Family history of ischemic heart disease and other diseases of the circulatory system: Secondary | ICD-10-CM | POA: Insufficient documentation

## 2019-02-23 DIAGNOSIS — M1990 Unspecified osteoarthritis, unspecified site: Secondary | ICD-10-CM | POA: Diagnosis not present

## 2019-02-23 DIAGNOSIS — K219 Gastro-esophageal reflux disease without esophagitis: Secondary | ICD-10-CM | POA: Diagnosis not present

## 2019-02-23 DIAGNOSIS — I38 Endocarditis, valve unspecified: Secondary | ICD-10-CM

## 2019-02-23 DIAGNOSIS — I2584 Coronary atherosclerosis due to calcified coronary lesion: Secondary | ICD-10-CM

## 2019-02-23 DIAGNOSIS — Z9889 Other specified postprocedural states: Secondary | ICD-10-CM | POA: Insufficient documentation

## 2019-02-23 LAB — COMPREHENSIVE METABOLIC PANEL
ALT: 10 U/L (ref 0–44)
AST: 20 U/L (ref 15–41)
Albumin: 2.8 g/dL — ABNORMAL LOW (ref 3.5–5.0)
Alkaline Phosphatase: 41 U/L (ref 38–126)
Anion gap: 10 (ref 5–15)
BUN: 28 mg/dL — ABNORMAL HIGH (ref 8–23)
CO2: 23 mmol/L (ref 22–32)
Calcium: 8 mg/dL — ABNORMAL LOW (ref 8.9–10.3)
Chloride: 106 mmol/L (ref 98–111)
Creatinine, Ser: 1.05 mg/dL (ref 0.61–1.24)
GFR calc Af Amer: >60 mL/min (ref >60)
GFR calc non Af Amer: >60 mL/min (ref >60)
Glucose, Bld: 108 mg/dL — ABNORMAL HIGH (ref 70–99)
Potassium: 3.7 mmol/L (ref 3.5–5.1)
Sodium: 139 mmol/L (ref 135–145)
Total Bilirubin: 0.6 mg/dL (ref 0.3–1.2)
Total Protein: 6.4 g/dL — ABNORMAL LOW (ref 6.5–8.1)

## 2019-02-23 LAB — CBC
HCT: 37.8 % — ABNORMAL LOW (ref 39.0–52.0)
Hemoglobin: 12.4 g/dL — ABNORMAL LOW (ref 13.0–17.0)
MCH: 32 pg (ref 26.0–34.0)
MCHC: 32.8 g/dL (ref 30.0–36.0)
MCV: 97.7 fL (ref 80.0–100.0)
Platelets: 317 10*3/uL (ref 150–400)
RBC: 3.87 MIL/uL — ABNORMAL LOW (ref 4.22–5.81)
RDW: 14 % (ref 11.5–15.5)
WBC: 6.9 10*3/uL (ref 4.0–10.5)
nRBC: 0 % (ref 0.0–0.2)

## 2019-02-23 NOTE — Patient Instructions (Signed)
Your physician recommends that you schedule a follow-up appointment in: 3 months.  We are working on getting you Octreotide to hopefully slow down secretions.  At the [facility], you and your health needs are our priority. As part of our continuing mission to provide you with exceptional heart care, we have created designated Provider Care Teams. These Care Teams include your primary Cardiologist (physician) and Advanced Practice Providers (APPs- Physician Assistants and Nurse Practitioners) who all work together to provide you with the care you need, when you need it.   You may see any of the following providers on your designated Care Team at your next follow up: Marland Kitchen Dr Glori Bickers . Dr Loralie Champagne . Darrick Grinder, NP   Please be sure to bring in all your medications bottles to every appointment.

## 2019-02-23 NOTE — Progress Notes (Signed)
Heart Failure Clinic Note   Date:  02/23/2019   [name], DOB 08-11-1931, MRN [id number]  Location: Home  Provider location: [facility] Type of Visit: Established patient, Add on  PCP:  Wenda Low, MD  Primary HF: Dr [name]  Chief Complaint: Fall   History of Present Illness:  Stuart Watson is an 85 y/o male with h/o temporal arteritis/polymalgia rheumatica, previous TIA/CVA, and recurrent pleural effusions s/p bilateral Pleurex tubes.  He was last seen in HF clinic 07/30/2018. He was doing fine. K was low and he was started on daily supp. Recheck K 2/21 was stable 3.9.   He had a fall off a ladder on 08/07/18. He fell off 5 foot ladder after reaching up and causing the ladder to fall. Denies LOC. He hit his head. CXR negative for fracture, head CT negative for acute changes. Zio Patch 4/20 without any acute episodes.   S/P recent Left inguinal hernia repair.   Today he returns for HF follow up.Overall feeling fine. Having ongoing sinus drainage.  Able to walk 1 mile twice a day. Mild dyspnea with exertion. Denies PND/Orthopnea. Appetite ok. No fever or chills. Weight at home 143-147 pounds. Taking all medications. Continues to drain from Pleurex catheters - L Pleurex Cath 1082ml total for week (drains 3 times per week). R Pleurex Cath 650 per week (drains 2 times per week)  He denies symptoms worrisome for COVID 19.     Past Medical History:  Diagnosis Date  . Actinic keratoses   . Arthritis   . Chronic bilateral pleural effusions   . Chronic sinusitis   . Colon polyps   . Dyspnea 07/13/2013   BNP normal  . Edema 07/13/2013  . GERD (gastroesophageal reflux disease)    omeprazole 1-2 times daily  . H/O TIA (transient ischemic attack) and stroke    on statins and plavix -saw Dr.Sethi in the past  . Headache   . History of BPH   . History of TIA (transient ischemic attack) 07/13/2013  . Left inguinal hernia   . Left inguinal hernia  01/27/2019  . Lumbar radiculopathy   . Need for shingles vaccine   . Optic neuritis 10/2015   left eye  . Perennial allergic rhinitis   . Polymyalgia rheumatica ([place]) 07/13/2013   Daily prednisone  . Restless leg syndrome   . Retinal disease    right eye since birth   . Shingles   . Temporal arteritis ([place]) 10/2015  . Wears glasses   . 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 BRAVO [place] STUDY N/A 04/18/2016   Procedure: BRAVO [place];  Surgeon: Wilford Corner, MD;  [facility];  Service: Endoscopy;  Laterality: N/A;  . CHEST TUBE INSERTION Right 02/13/2018   Procedure: INSERTION PLEURAL DRAINAGE CATHETER;  Surgeon: Ivin Poot, MD;  Location: [place];  Service: Thoracic;  Laterality: Right;  . COLONOSCOPY    . drropy eyelid surgery    . ESOPHAGOGASTRODUODENOSCOPY (EGD) WITH PROPOFOL N/A 04/18/2016   Procedure: ESOPHAGOGASTRODUODENOSCOPY (EGD) WITH PROPOFOL;  Surgeon: Wilford Corner, MD;  [facility];  Service: Endoscopy;  Laterality: N/A;  . INGUINAL HERNIA REPAIR Left 01/27/2019   Procedure: OPEN REPAIR LEFT INGUINAL HERNIA WITH MESH;  Surgeon: Fanny Skates, MD;  Location: [place];  Service: General;  Laterality: Left;  GENERAL AND TAP BLOCK ANESTHESIA  . INSERTION OF MESH Left 01/27/2019   Procedure:  Insertion Of Mesh;  Surgeon: Fanny Skates, MD;  Location: [place];  Service: General;  Laterality: Left;  . IR THORACENTESIS ASP PLEURAL SPACE W/IMG GUIDE  02/04/2018  . IR THORACENTESIS ASP PLEURAL SPACE W/IMG GUIDE  02/06/2018  . RIGHT/LEFT HEART CATH AND CORONARY ANGIOGRAPHY N/A 06/24/2018   Procedure: RIGHT/LEFT HEART CATH AND CORONARY ANGIOGRAPHY;  Surgeon: Jolaine Artist, MD;  Location: [facility];  Service: Cardiovascular;  Laterality: N/A;  . VASECTOMY       Current Outpatient Medications  Medication Sig Dispense Refill  . acetaminophen (TYLENOL) 500 MG tablet Take 500 mg by mouth daily as needed  for moderate pain or headache.    . calcium carbonate (TUMS - DOSED IN MG ELEMENTAL CALCIUM) 500 MG chewable tablet Chew 1 tablet by mouth daily as needed for indigestion.    . clopidogrel (PLAVIX) 75 MG tablet Take 75 mg by mouth daily.     . diphenhydrAMINE (BENADRYL) 25 MG tablet Take 25 mg by mouth daily as needed for allergies.    Marland Kitchen doxazosin (CARDURA) 8 MG tablet Take 8 mg by mouth daily.    . Multiple Vitamins-Minerals (MULTIVITAMIN ADULT PO) Take 1 tablet by mouth daily.    . Omega-3 Fatty Acids (FISH OIL PO) Take 2 capsules by mouth daily.    . pantoprazole (PROTONIX) 40 MG tablet Take 40 mg by mouth daily.    Vladimir Faster Glycol-Propyl Glycol (SYSTANE OP) Place 1 drop into both eyes 2 (two) times daily as needed (dry eyes).    . potassium chloride SA (K-DUR) 20 MEQ tablet Take 3 tablets (60 mEq total) by mouth every morning AND 2 tablets (40 mEq total) every evening. (Patient taking differently: Take 60 mEq total by mouth every morning AND 40 mEq every evening.) 460 tablet 3  . simvastatin (ZOCOR) 20 MG tablet Take 20 mg by mouth daily.    Marland Kitchen torsemide (DEMADEX) 20 MG tablet Take 2 tablets (40 mg total) by mouth 2 (two) times daily. 360 tablet 3  . cetirizine (ZYRTEC) 10 MG tablet Take 10 mg by mouth daily. As needed for allergies     No current facility-administered medications for this encounter.     Allergies:   Patient has no known allergies.   Social History:  The patient  reports that he quit smoking about 43 years ago. His smoking use included cigarettes. He has a 15.00 pack-year smoking history. He has never used smokeless tobacco. He reports current alcohol use of about 7.0 standard drinks of alcohol per week. He reports that he does not use drugs.   Family History:  The patient's family history includes Breast cancer in his mother; Coronary artery disease in his father and mother; Diabetes in his father and mother; Berenice Primas' disease in his sister; Heart attack in his father;  Stroke in his mother.   ROS:  Please see the history of present illness.   All other systems are personally reviewed and negative.   Exam:  General:  Well appearing. No resp difficulty HEENT: normal Neck: supple. JVD 7-8 . Carotids 2+ bilat; no bruits. No lymphadenopathy or thryomegaly appreciated. Cor: PMI nondisplaced. Regular rate & rhythm. No rubs, gallops or murmurs. Lungs: Decreased on the right.  R and L pleurex catheters.  Abdomen: soft, nontender, nondistended. No hepatosplenomegaly. No bruits or masses. Good bowel sounds. Extremities: no cyanosis, clubbing, rash, R and LLE 1+ edema. No cuticles.  Heavily dystrophic nails  Neuro: alert & orientedx3, cranial nerves grossly intact. moves all  4 extremities w/o difficulty. Affect pleasant  Recent Labs: 05/12/2018: B Natriuretic Peptide 76.9 11/06/2018: Magnesium 2.2 01/19/2019: ALT 11; BUN 24; Creatinine, Ser 1.03; Hemoglobin 12.8; Platelets 384; Potassium 4.3; Sodium 139  Personally reviewed   Wt Readings from Last 3 Encounters:  02/23/19 68.5 kg (151 lb)  02/18/19 66.7 kg (147 lb)  01/27/19 64.4 kg (142 lb)      ASSESSMENT AND PLAN:  1. Previous fall with ? near syncope - Zio Patch placed in 4/20. NSR with rare brief runs (10 beats or less of SVT & NSVT) No pauses.  - Suspect possible vagal episode   2. Recurrent bilateral pleural effusions - Unclear etiology. We have done an exhaustive w/u with no clear cause. . 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 Dr Beryle Beams does not think he has a hematologic cause. - Myeloma panel with small M-spike with IgG monoclonal with kappa light chains. Likely MGUS - S/p bilat pleurex tubes with decreasing drainage Continues to drain L pleurex 3 times a week and R pleurex 2 times a week.  - Followed closely by Dr Lawson Fiscal.  - Considering somatostatin. Will try to get approved.   3. Coronary calcifications and abnormal stress test - No s/s ischemia.  - Left heart cath as above 12/19 minimal  CAD  4.S/P left inguinal hernia repair.  Recovered and released.     Follow up ub 8 weeks.   Jeanmarie Hubert, NP  02/23/2019 2:50 PM  [facility] [address] [facility] [place] [address] [phone] (office) [phone] (fax)  Patient seen and examined with Darrick Grinder, NP. We discussed all aspects of the encounter. I agree with the assessment and plan as stated above.   I am convinced he has true yellow nail syndrome with ongoing drainage from his Pleurex tubes and severe sinusitis. He has seen multiple ENTs without any benefit. We need to trya nd slow down his lymph drainage. Will start outpatient octreotide depot injections. Discussed dosing with PharmD personally. Will arrange to begin in near future. Continue current diuretic dosing and Pleurex drainage.   Glori Bickers, MD  10:20 AM

## 2019-03-04 DIAGNOSIS — J918 Pleural effusion in other conditions classified elsewhere: Secondary | ICD-10-CM | POA: Diagnosis not present

## 2019-03-24 DIAGNOSIS — R202 Paresthesia of skin: Secondary | ICD-10-CM | POA: Diagnosis not present

## 2019-03-24 DIAGNOSIS — R5383 Other fatigue: Secondary | ICD-10-CM | POA: Diagnosis not present

## 2019-04-01 ENCOUNTER — Ambulatory Visit: Payer: Medicare Other | Admitting: Cardiothoracic Surgery

## 2019-04-01 DIAGNOSIS — J918 Pleural effusion in other conditions classified elsewhere: Secondary | ICD-10-CM | POA: Diagnosis not present

## 2019-04-02 ENCOUNTER — Ambulatory Visit (INDEPENDENT_AMBULATORY_CARE_PROVIDER_SITE_OTHER): Payer: Medicare Other | Admitting: Cardiothoracic Surgery

## 2019-04-02 ENCOUNTER — Ambulatory Visit
Admission: RE | Admit: 2019-04-02 | Discharge: 2019-04-02 | Disposition: A | Discharge status: Home / Self Care | Payer: Medicare Other | Source: Ambulatory Visit | Attending: Cardiothoracic Surgery | Admitting: Cardiothoracic Surgery

## 2019-04-02 ENCOUNTER — Other Ambulatory Visit: Payer: Self-pay | Admitting: Cardiothoracic Surgery

## 2019-04-02 ENCOUNTER — Encounter: Payer: Self-pay | Admitting: Cardiothoracic Surgery

## 2019-04-02 VITALS — BP 124/64 | HR 64 | Temp 97.7°F | Resp 20 | Ht 66.0 in | Wt 149.0 lb

## 2019-04-02 DIAGNOSIS — I2584 Coronary atherosclerosis due to calcified coronary lesion: Secondary | ICD-10-CM | POA: Diagnosis not present

## 2019-04-02 DIAGNOSIS — J90 Pleural effusion, not elsewhere classified: Secondary | ICD-10-CM | POA: Diagnosis not present

## 2019-04-02 DIAGNOSIS — I2510 Atherosclerotic heart disease of native coronary artery without angina pectoris: Secondary | ICD-10-CM | POA: Diagnosis not present

## 2019-04-02 NOTE — Progress Notes (Signed)
PCP is Wenda Low, MD Referring Provider is Lauraine Rinne, NP  Chief Complaint  Patient presents with  . Pleural Effusion    6 week f/u with CXR    HPI: Schedule monthly office visit with chest x-ray for bilateral pleural effusions.  Patient has probable connective tissue disease responsible for the benign bilateral pleural effusions.  He has had Pleurx catheters in place since July 2019. He drains the catheters 3 days a week, the left side drains the most 1.5 L of fluid per week and the right side drains 800 cc/week.  Catheter sites are clean without drainage or cellulitis.  He denies breathing problems or shortness of breath.  The fluid is clear.  His weight has been stable.  He tries to supplement his diet with extra protein which is lost in the pleural effusion fluid. Past Medical History:  Diagnosis Date  . Actinic keratoses   . Arthritis   . Chronic bilateral pleural effusions   . Chronic sinusitis   . Colon polyps   . Dyspnea 07/13/2013   BNP normal  . Edema 07/13/2013  . GERD (gastroesophageal reflux disease)    omeprazole 1-2 times daily  . H/O TIA (transient ischemic attack) and stroke    on statins and plavix -saw Dr.Sethi in the past  . Headache   . History of BPH   . History of TIA (transient ischemic attack) 07/13/2013  . Left inguinal hernia   . Left inguinal hernia 01/27/2019  . Lumbar radiculopathy   . Need for shingles vaccine   . Optic neuritis 10/2015   left eye  . Perennial allergic rhinitis   . Polymyalgia rheumatica ([place]) 07/13/2013   Daily prednisone  . Restless leg syndrome   . Retinal disease    right eye since birth   . Shingles   . Temporal arteritis ([place]) 10/2015  . Wears glasses   . 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 BRAVO [place] STUDY N/A 04/18/2016   Procedure: BRAVO [place];  Surgeon: Wilford Corner, MD;  [facility];  Service: Endoscopy;  Laterality: N/A;  . CHEST TUBE  INSERTION Right 02/13/2018   Procedure: INSERTION PLEURAL DRAINAGE CATHETER;  Surgeon: Ivin Poot, MD;  Location: [facility];  Service: Thoracic;  Laterality: Right;  . COLONOSCOPY    . drropy eyelid surgery    . ESOPHAGOGASTRODUODENOSCOPY (EGD) WITH PROPOFOL N/A 04/18/2016   Procedure: ESOPHAGOGASTRODUODENOSCOPY (EGD) WITH PROPOFOL;  Surgeon: Wilford Corner, MD;  [facility];  Service: Endoscopy;  Laterality: N/A;  . INGUINAL HERNIA REPAIR Left 01/27/2019   Procedure: OPEN REPAIR LEFT INGUINAL HERNIA WITH MESH;  Surgeon: Fanny Skates, MD;  Location: [place] [facility];  Service: General;  Laterality: Left;  GENERAL AND TAP BLOCK ANESTHESIA  . INSERTION OF MESH Left 01/27/2019   Procedure: Insertion Of Mesh;  Surgeon: Fanny Skates, MD;  Location: [facility];  Service: General;  Laterality: Left;  . IR THORACENTESIS ASP PLEURAL SPACE W/IMG GUIDE  02/04/2018  . IR THORACENTESIS ASP PLEURAL SPACE W/IMG GUIDE  02/06/2018  . RIGHT/LEFT HEART CATH AND CORONARY ANGIOGRAPHY N/A 06/24/2018   Procedure: RIGHT/LEFT HEART CATH AND CORONARY ANGIOGRAPHY;  Surgeon: Jolaine Artist, MD;  Location: [facility];  Service: Cardiovascular;  Laterality: N/A;  . VASECTOMY      Family History  Problem Relation Age of Onset  . Stroke Mother   . Coronary artery disease Mother   . Diabetes Mother   .  Breast cancer Mother   . Heart attack Father   . Coronary artery disease Father   . Diabetes Father   . Graves' disease Sister     Social History Social History   Tobacco Use  . Smoking status: Former Smoker    Packs/day: 1.00    Years: 15.00    Pack years: 15.00    Types: Cigarettes    Quit date: 07/23/1975    Years since quitting: 43.7  . Smokeless tobacco: Never Used  Substance Use Topics  . Alcohol use: Yes    Alcohol/week: 7.0 standard drinks    Types: 7 Glasses of wine per week    Comment: 1 glass red wine daily  . Drug use: No    Current Outpatient Medications  Medication Sig Dispense  Refill  . acetaminophen (TYLENOL) 500 MG tablet Take 500 mg by mouth daily as needed for moderate pain or headache.    . calcium carbonate (TUMS - DOSED IN MG ELEMENTAL CALCIUM) 500 MG chewable tablet Chew 1 tablet by mouth daily as needed for indigestion.    . cetirizine (ZYRTEC) 10 MG tablet Take 10 mg by mouth daily. As needed for allergies    . clopidogrel (PLAVIX) 75 MG tablet Take 75 mg by mouth daily.     . diphenhydrAMINE (BENADRYL) 25 MG tablet Take 25 mg by mouth daily as needed for allergies.    Marland Kitchen doxazosin (CARDURA) 8 MG tablet Take 8 mg by mouth daily.    . Multiple Vitamins-Minerals (MULTIVITAMIN ADULT PO) Take 1 tablet by mouth daily.    . Omega-3 Fatty Acids (FISH OIL PO) Take 2 capsules by mouth daily.    . pantoprazole (PROTONIX) 40 MG tablet Take 40 mg by mouth daily.    Vladimir Faster Glycol-Propyl Glycol (SYSTANE OP) Place 1 drop into both eyes 2 (two) times daily as needed (dry eyes).    . potassium chloride SA (K-DUR) 20 MEQ tablet Take 3 tablets (60 mEq total) by mouth every morning AND 2 tablets (40 mEq total) every evening. (Patient taking differently: Take 60 mEq total by mouth every morning AND 40 mEq every evening.) 460 tablet 3  . simvastatin (ZOCOR) 20 MG tablet Take 20 mg by mouth daily.    Marland Kitchen torsemide (DEMADEX) 20 MG tablet Take 2 tablets (40 mg total) by mouth 2 (two) times daily. 360 tablet 3   No current facility-administered medications for this visit.     No Known Allergies  Review of Systems  Weight stable Chronic lower extremity edema, mild Patient is being evaluated by Dr. [name] for octreotide/somatostatin treatment to reduce the pleural effusion production  BP 124/64   Pulse 64   Temp 97.7 F (36.5 C)   Resp 20   Ht 5\' 6"  (1.676 m)   Wt 149 lb (67.6 kg)   SpO2 97% Comment: RA  BMI 24.05 kg/m  Physical Exam Alert and comfortable Both Pleurx catheter dressings dry and intact Lungs lungs are clear bilaterally Heart rhythm is regular Soft  systolic flow murmur 2+ bilateral pedal edema  Diagnostic Tests: I reviewed today's chest x-ray which shows clear lung fields, Pleurx catheters in good position  Impression: Chronic bilateral pleural effusions probably secondary to yellow nail syndrome/connective tissue disease.  Continue with current Pleurx catheter care.  Plan: Return for follow-up in 6 weeks.   Len Childs, MD [facility] [phone]

## 2019-04-24 DIAGNOSIS — H47012 Ischemic optic neuropathy, left eye: Secondary | ICD-10-CM | POA: Diagnosis not present

## 2019-04-24 DIAGNOSIS — H31001 Unspecified chorioretinal scars, right eye: Secondary | ICD-10-CM | POA: Diagnosis not present

## 2019-04-24 DIAGNOSIS — H4720 Unspecified optic atrophy: Secondary | ICD-10-CM | POA: Diagnosis not present

## 2019-04-24 DIAGNOSIS — H2513 Age-related nuclear cataract, bilateral: Secondary | ICD-10-CM | POA: Diagnosis not present

## 2019-04-24 DIAGNOSIS — H524 Presbyopia: Secondary | ICD-10-CM | POA: Diagnosis not present

## 2019-04-28 DIAGNOSIS — L570 Actinic keratosis: Secondary | ICD-10-CM | POA: Diagnosis not present

## 2019-04-28 DIAGNOSIS — D1801 Hemangioma of skin and subcutaneous tissue: Secondary | ICD-10-CM | POA: Diagnosis not present

## 2019-04-28 DIAGNOSIS — L812 Freckles: Secondary | ICD-10-CM | POA: Diagnosis not present

## 2019-04-28 DIAGNOSIS — L821 Other seborrheic keratosis: Secondary | ICD-10-CM | POA: Diagnosis not present

## 2019-04-28 DIAGNOSIS — Z85828 Personal history of other malignant neoplasm of skin: Secondary | ICD-10-CM | POA: Diagnosis not present

## 2019-04-29 DIAGNOSIS — J918 Pleural effusion in other conditions classified elsewhere: Secondary | ICD-10-CM | POA: Diagnosis not present

## 2019-05-19 ENCOUNTER — Other Ambulatory Visit: Payer: Self-pay | Admitting: Cardiothoracic Surgery

## 2019-05-19 DIAGNOSIS — J90 Pleural effusion, not elsewhere classified: Secondary | ICD-10-CM

## 2019-05-19 IMAGING — DX DG CHEST 2V
2 series · 2 of 2 positions shown · non-contrast
Comparison: 11/22/2017

CLINICAL DATA: Shortness of breath and weakness.

EXAM:
CHEST - 2 VIEW

[chest pa]
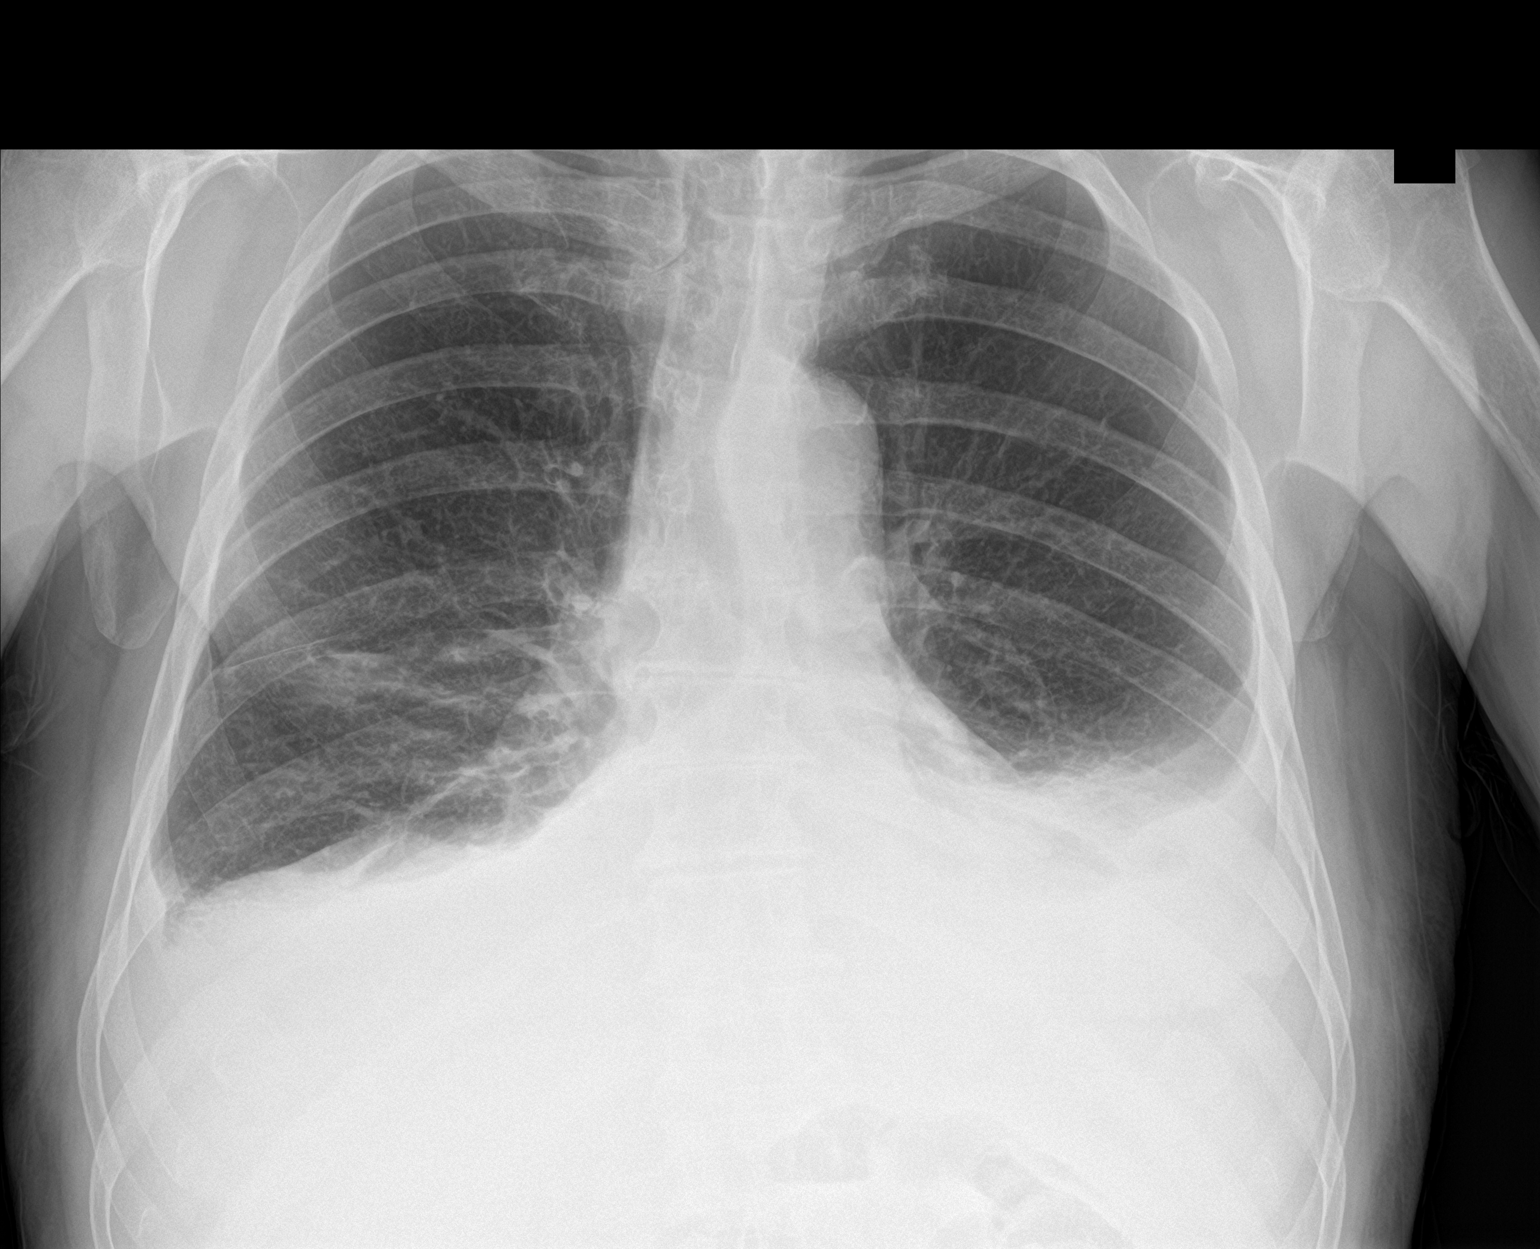

[chest lat]
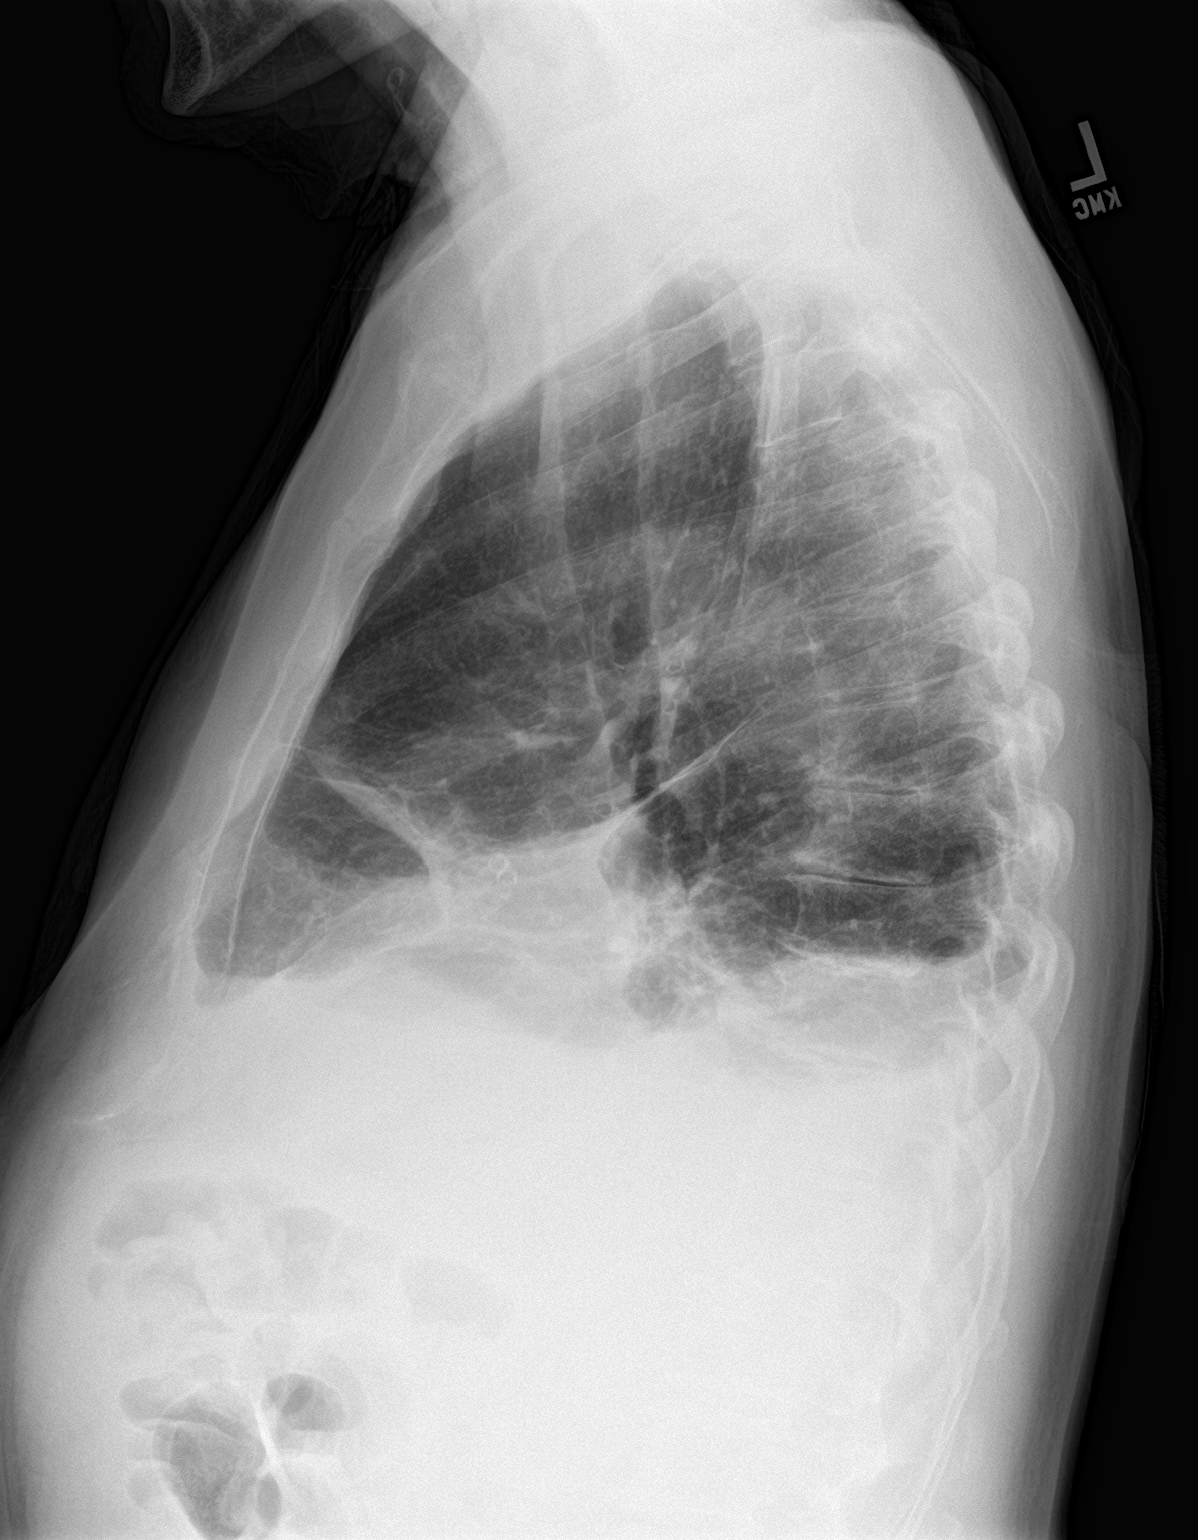

[2 of 2 positions shown; findings below may reference images not displayed]

FINDINGS: Normal heart size. Mild aortic atherosclerosis. There is persistent
bilateral pleural effusions, left greater than right. Streaky areas
of atelectasis within both lung bases again noted. Spondylosis noted
within the thoracic spine.
IMPRESSION: 1. No change in bilateral pleural effusions, left greater than
right.
2.  Aortic Atherosclerosis ([id number]).

## 2019-05-20 ENCOUNTER — Ambulatory Visit: Payer: Medicare Other | Admitting: Cardiothoracic Surgery

## 2019-05-20 ENCOUNTER — Ambulatory Visit
Admission: RE | Admit: 2019-05-20 | Discharge: 2019-05-20 | Disposition: A | Discharge status: Home / Self Care | Payer: Medicare Other | Source: Ambulatory Visit | Attending: Cardiothoracic Surgery | Admitting: Cardiothoracic Surgery

## 2019-05-20 ENCOUNTER — Other Ambulatory Visit: Payer: Self-pay

## 2019-05-20 ENCOUNTER — Encounter: Payer: Self-pay | Admitting: Cardiothoracic Surgery

## 2019-05-20 ENCOUNTER — Ambulatory Visit (INDEPENDENT_AMBULATORY_CARE_PROVIDER_SITE_OTHER): Payer: Medicare Other | Admitting: Cardiothoracic Surgery

## 2019-05-20 VITALS — BP 115/60 | HR 66 | Temp 97.7°F | Resp 20 | Ht 66.0 in | Wt 151.8 lb

## 2019-05-20 DIAGNOSIS — I2584 Coronary atherosclerosis due to calcified coronary lesion: Secondary | ICD-10-CM | POA: Diagnosis not present

## 2019-05-20 DIAGNOSIS — J90 Pleural effusion, not elsewhere classified: Secondary | ICD-10-CM

## 2019-05-20 DIAGNOSIS — I2510 Atherosclerotic heart disease of native coronary artery without angina pectoris: Secondary | ICD-10-CM

## 2019-05-20 NOTE — Progress Notes (Signed)
PCP is Wenda Low, MD Referring Provider is Lauraine Rinne, NP  Chief Complaint  Patient presents with  . Pleural Effusion    F/U w/ CXR    HPI: Scheduled follow-up visit for bilateral Pleurx catheters [July 2019] for Chronic effusions related to problem with yellow nail syndrome.  Drainage is been persistent, approximately 1200 cc/week on the  left and 800 cc/week on the right side.  The left-sided effusion has pink-tinged.  No associated problems with catheter exit site.  No shortness of breath.  Weight has been stable or slightly increased.  Past Medical History:  Diagnosis Date  . Actinic keratoses   . Arthritis   . Chronic bilateral pleural effusions   . Chronic sinusitis   . Colon polyps   . Dyspnea 07/13/2013   BNP normal  . Edema 07/13/2013  . GERD (gastroesophageal reflux disease)    omeprazole 1-2 times daily  . H/O TIA (transient ischemic attack) and stroke    on statins and plavix -saw Dr.Sethi in the past  . Headache   . History of BPH   . History of TIA (transient ischemic attack) 07/13/2013  . Left inguinal hernia   . Left inguinal hernia 01/27/2019  . Lumbar radiculopathy   . Need for shingles vaccine   . Optic neuritis 10/2015   left eye  . Perennial allergic rhinitis   . Polymyalgia rheumatica ([place]) 07/13/2013   Daily prednisone  . Restless leg syndrome   . Retinal disease    right eye since birth   . Shingles   . Temporal arteritis ([place]) 10/2015  . Wears glasses   . 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 BRAVO [place] STUDY N/A 04/18/2016   Procedure: BRAVO [place];  Surgeon: Wilford Corner, MD;  [facility];  Service: Endoscopy;  Laterality: N/A;  . CHEST TUBE INSERTION Right 02/13/2018   Procedure: INSERTION PLEURAL DRAINAGE CATHETER;  Surgeon: Ivin Poot, MD;  Location: [facility];  Service: Thoracic;  Laterality: Right;  . COLONOSCOPY    . drropy eyelid surgery    .  ESOPHAGOGASTRODUODENOSCOPY (EGD) WITH PROPOFOL N/A 04/18/2016   Procedure: ESOPHAGOGASTRODUODENOSCOPY (EGD) WITH PROPOFOL;  Surgeon: Wilford Corner, MD;  [facility];  Service: Endoscopy;  Laterality: N/A;  . INGUINAL HERNIA REPAIR Left 01/27/2019   Procedure: OPEN REPAIR LEFT INGUINAL HERNIA WITH MESH;  Surgeon: Fanny Skates, MD;  Location: [facility];  Service: General;  Laterality: Left;  GENERAL AND TAP BLOCK ANESTHESIA  . INSERTION OF MESH Left 01/27/2019   Procedure: Insertion Of Mesh;  Surgeon: Fanny Skates, MD;  Location: [facility];  Service: General;  Laterality: Left;  . IR THORACENTESIS ASP PLEURAL SPACE W/IMG GUIDE  02/04/2018  . IR THORACENTESIS ASP PLEURAL SPACE W/IMG GUIDE  02/06/2018  . RIGHT/LEFT HEART CATH AND CORONARY ANGIOGRAPHY N/A 06/24/2018   Procedure: RIGHT/LEFT HEART CATH AND CORONARY ANGIOGRAPHY;  Surgeon: Jolaine Artist, MD;  Location: [facility];  Service: Cardiovascular;  Laterality: N/A;  . VASECTOMY      Family History  Problem Relation Age of Onset  . Stroke Mother   . Coronary artery disease Mother   . Diabetes Mother   . Breast cancer Mother   . Heart attack Father   . Coronary artery disease Father   . Diabetes Father   . Graves' disease Sister     Social History Social History   Tobacco Use  . Smoking status: Former Smoker  Packs/day: 1.00    Years: 15.00    Pack years: 15.00    Types: Cigarettes    Quit date: 07/23/1975    Years since quitting: 43.8  . Smokeless tobacco: Never Used  Substance Use Topics  . Alcohol use: Yes    Alcohol/week: 7.0 standard drinks    Types: 7 Glasses of wine per week    Comment: 1 glass red wine daily  . Drug use: No    Current Outpatient Medications  Medication Sig Dispense Refill  . acetaminophen (TYLENOL) 500 MG tablet Take 500 mg by mouth daily as needed for moderate pain or headache.    . calcium carbonate (TUMS - DOSED IN MG ELEMENTAL CALCIUM) 500 MG chewable tablet Chew 1 tablet  by mouth daily as needed for indigestion.    . cetirizine (ZYRTEC) 10 MG tablet Take 10 mg by mouth daily. As needed for allergies    . clopidogrel (PLAVIX) 75 MG tablet Take 75 mg by mouth daily.     . diphenhydrAMINE (BENADRYL) 25 MG tablet Take 25 mg by mouth daily as needed for allergies.    Marland Kitchen doxazosin (CARDURA) 8 MG tablet Take 8 mg by mouth daily.    . Multiple Vitamins-Minerals (MULTIVITAMIN ADULT PO) Take 1 tablet by mouth daily.    . Omega-3 Fatty Acids (FISH OIL PO) Take 2 capsules by mouth daily.    . pantoprazole (PROTONIX) 40 MG tablet Take 40 mg by mouth daily.    Vladimir Faster Glycol-Propyl Glycol (SYSTANE OP) Place 1 drop into both eyes 2 (two) times daily as needed (dry eyes).    . potassium chloride SA (K-DUR) 20 MEQ tablet Take 3 tablets (60 mEq total) by mouth every morning AND 2 tablets (40 mEq total) every evening. (Patient taking differently: Take 60 mEq total by mouth every morning AND 40 mEq every evening.) 460 tablet 3  . simvastatin (ZOCOR) 20 MG tablet Take 20 mg by mouth daily.    Marland Kitchen torsemide (DEMADEX) 20 MG tablet Take 2 tablets (40 mg total) by mouth 2 (two) times daily. 360 tablet 3   No current facility-administered medications for this visit.     No Known Allergies  Review of Systems  No change  BP 115/65 (BP Location: Right Arm)   Pulse 66   Temp 97.7 F (36.5 C) (Skin)   Resp 20   Ht 5\' 6"  (1.676 m)   Wt 151 lb 12.8 oz (68.9 kg)   SpO2 95% Comment: RA  BMI 24.50 kg/m  Physical Exam      Exam    General- alert and comfortable    Neck- no JVD, no cervical adenopathy palpable, no carotid bruit   Lungs- clear without rales, wheezes   Cor- regular rate and rhythm, no murmur , gallop   Abdomen- soft, non-tender   Extremities - warm, non-tender, minimal edema   Neuro- oriented, appropriate, no focal weakness   Diagnostic Tests: Chest x-ray taken today is personally reviewed.  Both pleural catheters are in good position.  The right pleural space  is completely drained.  The left pleural space has a slight fullness at the costophrenic angle.  Impression: Persistent inflammatory bilateral effusions probably related to his yellow nail syndrome.  We will continue the current drainage schedule.  Patient will reduce his fish oil tablets to 1 daily to see if that changes the serosanguineous color of the left pleural fluid.  Plan: Return for follow-up and assessment in about 6 weeks.   Tharon Aquas  Adelene Idler, MD [facility] [phone]

## 2019-05-27 IMAGING — US US THORACENTESIS ASP PLEURAL SPACE W/IMG GUIDE
1 series · 5 of 5 positions shown · non-contrast
Comparison: none

INDICATION: Patient with history of CHF, dyspnea, bilateral pleural effusions,
right greater than left. Request made for diagnostic and therapeutic
right thoracentesis.

[Series 1: us thoracentesis asp pleural space w/img guide · 5 of 5 slices shown]
[im 1/5]
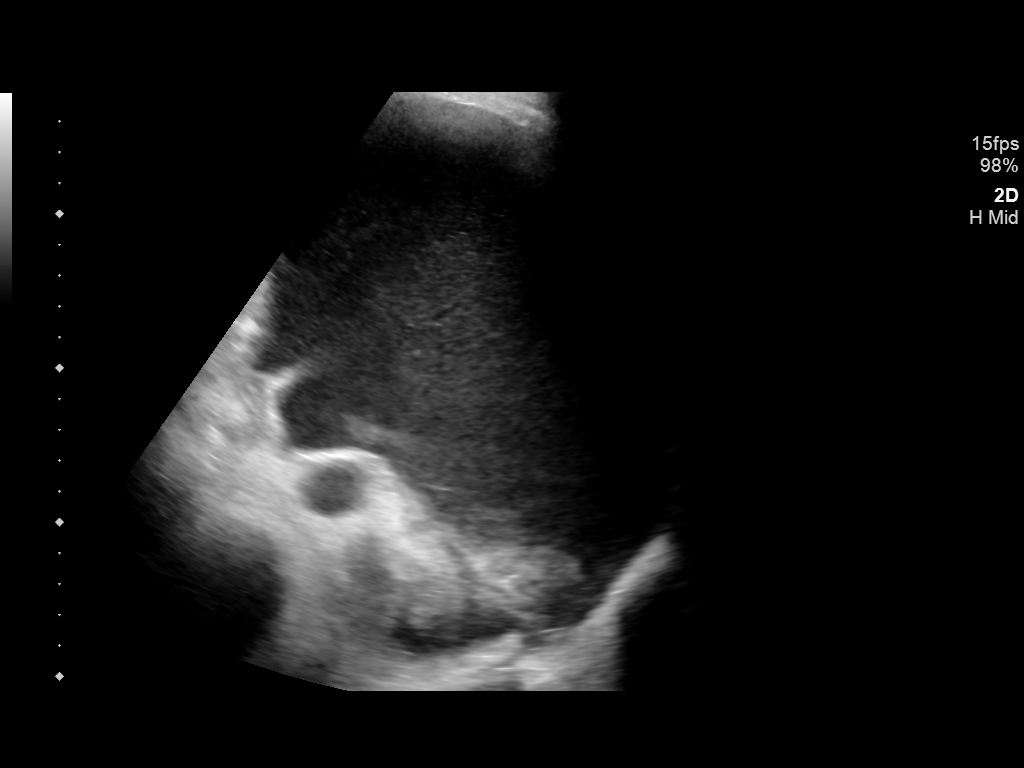
[im 2/5]
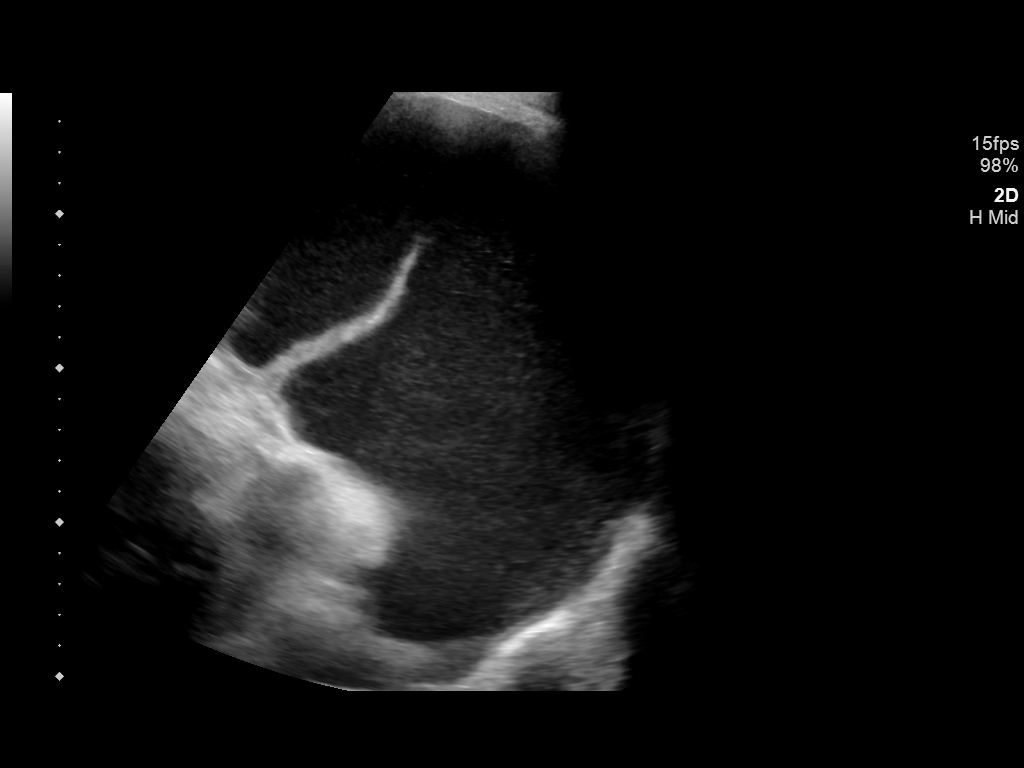
[im 3/5]
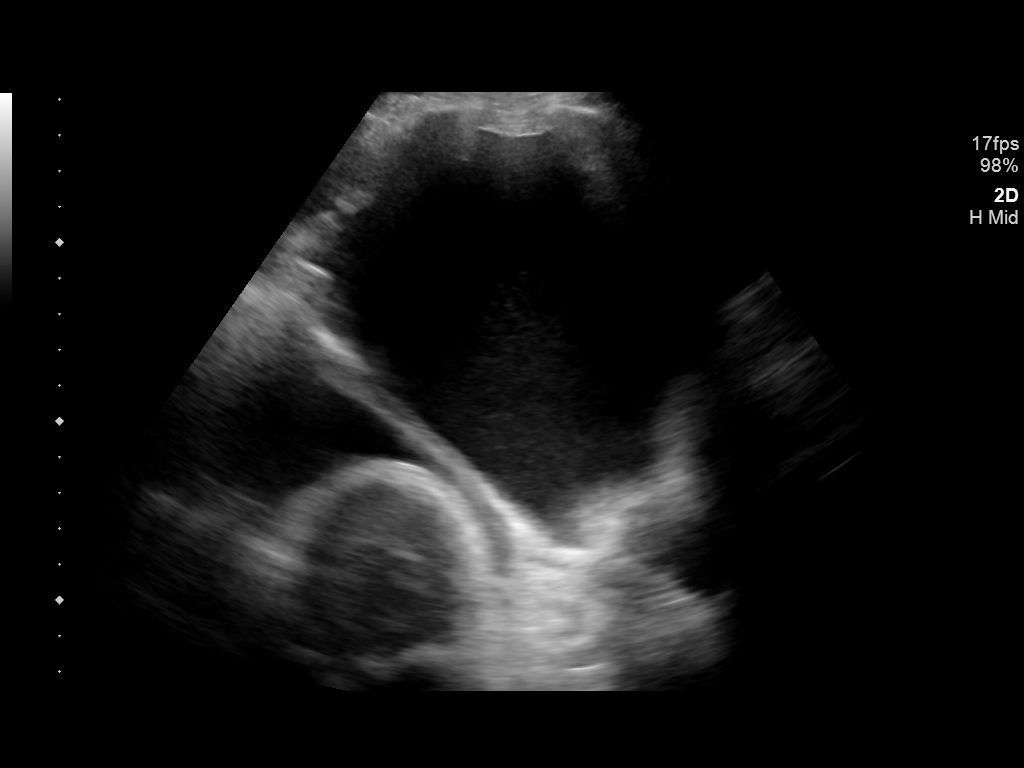
[im 4/5]
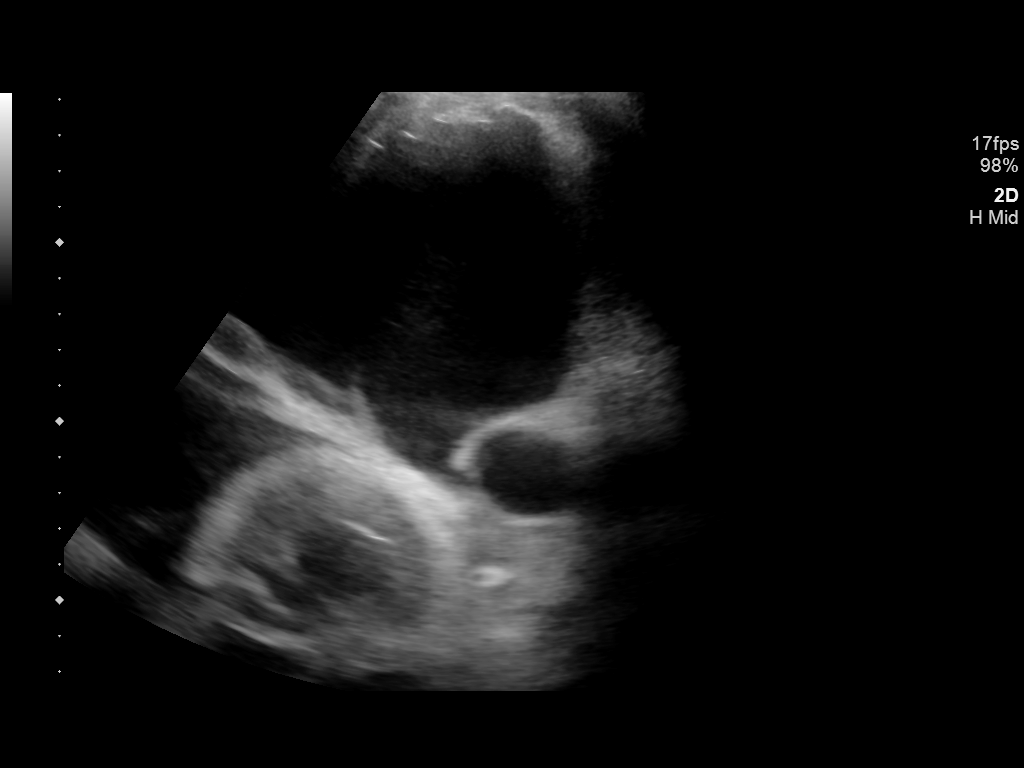
[im 5/5]
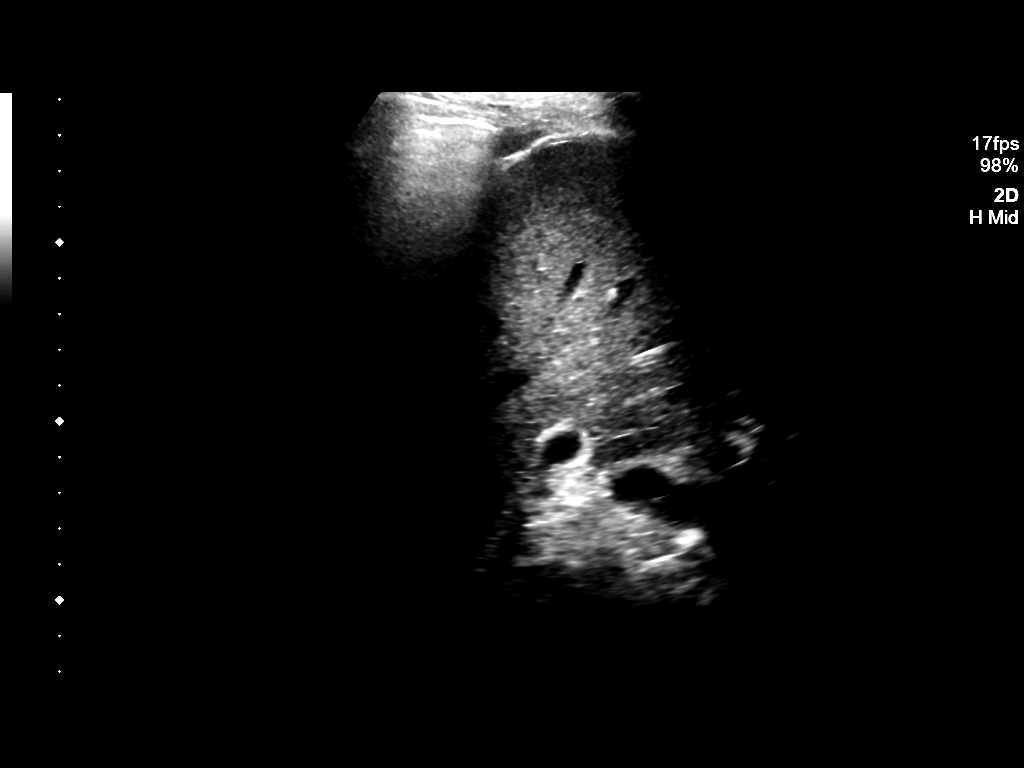

[5 of 5 positions shown; findings below may reference images not displayed]

EXAM:
ULTRASOUND GUIDED DIAGNOSTIC AND THERAPEUTIC RIGHT THORACENTESIS

MEDICATIONS:
None

COMPLICATIONS:
None immediate.

PROCEDURE:
An ultrasound guided thoracentesis was thoroughly discussed with the
patient and questions answered. The benefits, risks, alternatives
and complications were also discussed. The patient understands and
wishes to proceed with the procedure. Written consent was obtained.

Ultrasound was performed to localize and mark an adequate pocket of
fluid in the right chest. The area was then prepped and draped in
the normal sterile fashion. 1% Lidocaine was used for local
anesthesia. Under ultrasound guidance a 6 Fr Safe-T-Centesis
catheter was introduced. Thoracentesis was performed. The catheter
was removed and a dressing applied.
FINDINGS: A total of approximately 1.8 liters of yellow fluid was removed.
Samples were sent to the laboratory as requested by the clinical
team.
IMPRESSION: Successful ultrasound guided diagnostic and therapeutic right
thoracentesis yielding 1.8 liters of pleural fluid.

## 2019-05-28 ENCOUNTER — Encounter (HOSPITAL_COMMUNITY): Payer: Self-pay | Admitting: Internal Medicine

## 2019-05-28 ENCOUNTER — Ambulatory Visit (HOSPITAL_COMMUNITY)
Admission: RE | Admit: 2019-05-28 | Discharge: 2019-05-28 | Disposition: A | Discharge status: Home / Self Care | Payer: Medicare Other | Source: Ambulatory Visit | Attending: Internal Medicine | Admitting: Internal Medicine

## 2019-05-28 ENCOUNTER — Other Ambulatory Visit: Payer: Self-pay

## 2019-05-28 VITALS — BP 106/58 | HR 65 | Wt 150.6 lb

## 2019-05-28 DIAGNOSIS — K219 Gastro-esophageal reflux disease without esophagitis: Secondary | ICD-10-CM | POA: Diagnosis not present

## 2019-05-28 DIAGNOSIS — W19XXXA Unspecified fall, initial encounter: Secondary | ICD-10-CM | POA: Diagnosis not present

## 2019-05-28 DIAGNOSIS — I2510 Atherosclerotic heart disease of native coronary artery without angina pectoris: Secondary | ICD-10-CM | POA: Insufficient documentation

## 2019-05-28 DIAGNOSIS — Z8673 Personal history of transient ischemic attack (TIA), and cerebral infarction without residual deficits: Secondary | ICD-10-CM | POA: Diagnosis not present

## 2019-05-28 DIAGNOSIS — M1990 Unspecified osteoarthritis, unspecified site: Secondary | ICD-10-CM | POA: Insufficient documentation

## 2019-05-28 DIAGNOSIS — M353 Polymyalgia rheumatica: Secondary | ICD-10-CM | POA: Diagnosis not present

## 2019-05-28 DIAGNOSIS — Z7902 Long term (current) use of antithrombotics/antiplatelets: Secondary | ICD-10-CM | POA: Diagnosis not present

## 2019-05-28 DIAGNOSIS — R0600 Dyspnea, unspecified: Secondary | ICD-10-CM | POA: Diagnosis not present

## 2019-05-28 DIAGNOSIS — R9430 Abnormal result of cardiovascular function study, unspecified: Secondary | ICD-10-CM | POA: Diagnosis not present

## 2019-05-28 DIAGNOSIS — G2581 Restless legs syndrome: Secondary | ICD-10-CM | POA: Insufficient documentation

## 2019-05-28 DIAGNOSIS — L605 Yellow nail syndrome: Secondary | ICD-10-CM

## 2019-05-28 DIAGNOSIS — J90 Pleural effusion, not elsewhere classified: Secondary | ICD-10-CM | POA: Insufficient documentation

## 2019-05-28 DIAGNOSIS — Z8249 Family history of ischemic heart disease and other diseases of the circulatory system: Secondary | ICD-10-CM | POA: Insufficient documentation

## 2019-05-28 DIAGNOSIS — I5032 Chronic diastolic (congestive) heart failure: Secondary | ICD-10-CM | POA: Diagnosis not present

## 2019-05-28 DIAGNOSIS — Z79899 Other long term (current) drug therapy: Secondary | ICD-10-CM | POA: Diagnosis not present

## 2019-05-28 DIAGNOSIS — Z87891 Personal history of nicotine dependence: Secondary | ICD-10-CM | POA: Insufficient documentation

## 2019-05-28 LAB — BASIC METABOLIC PANEL
Anion gap: 7 (ref 5–15)
BUN: 22 mg/dL (ref 8–23)
CO2: 27 mmol/L (ref 22–32)
Calcium: 8.1 mg/dL — ABNORMAL LOW (ref 8.9–10.3)
Chloride: 105 mmol/L (ref 98–111)
Creatinine, Ser: 1.07 mg/dL (ref 0.61–1.24)
GFR calc Af Amer: >60 mL/min (ref >60)
GFR calc non Af Amer: >60 mL/min (ref >60)
Glucose, Bld: 108 mg/dL — ABNORMAL HIGH (ref 70–99)
Potassium: 4.2 mmol/L (ref 3.5–5.1)
Sodium: 139 mmol/L (ref 135–145)

## 2019-05-28 LAB — BRAIN NATRIURETIC PEPTIDE: B Natriuretic Peptide: 33.9 pg/mL (ref 0.0–100.0)

## 2019-05-28 IMAGING — DX DG CHEST 1V
1 series · 1 of 1 positions shown · non-contrast
Comparison: Yesterday

CLINICAL DATA: Thoracentesis

EXAM:
CHEST  1 VIEW

[chest pa]
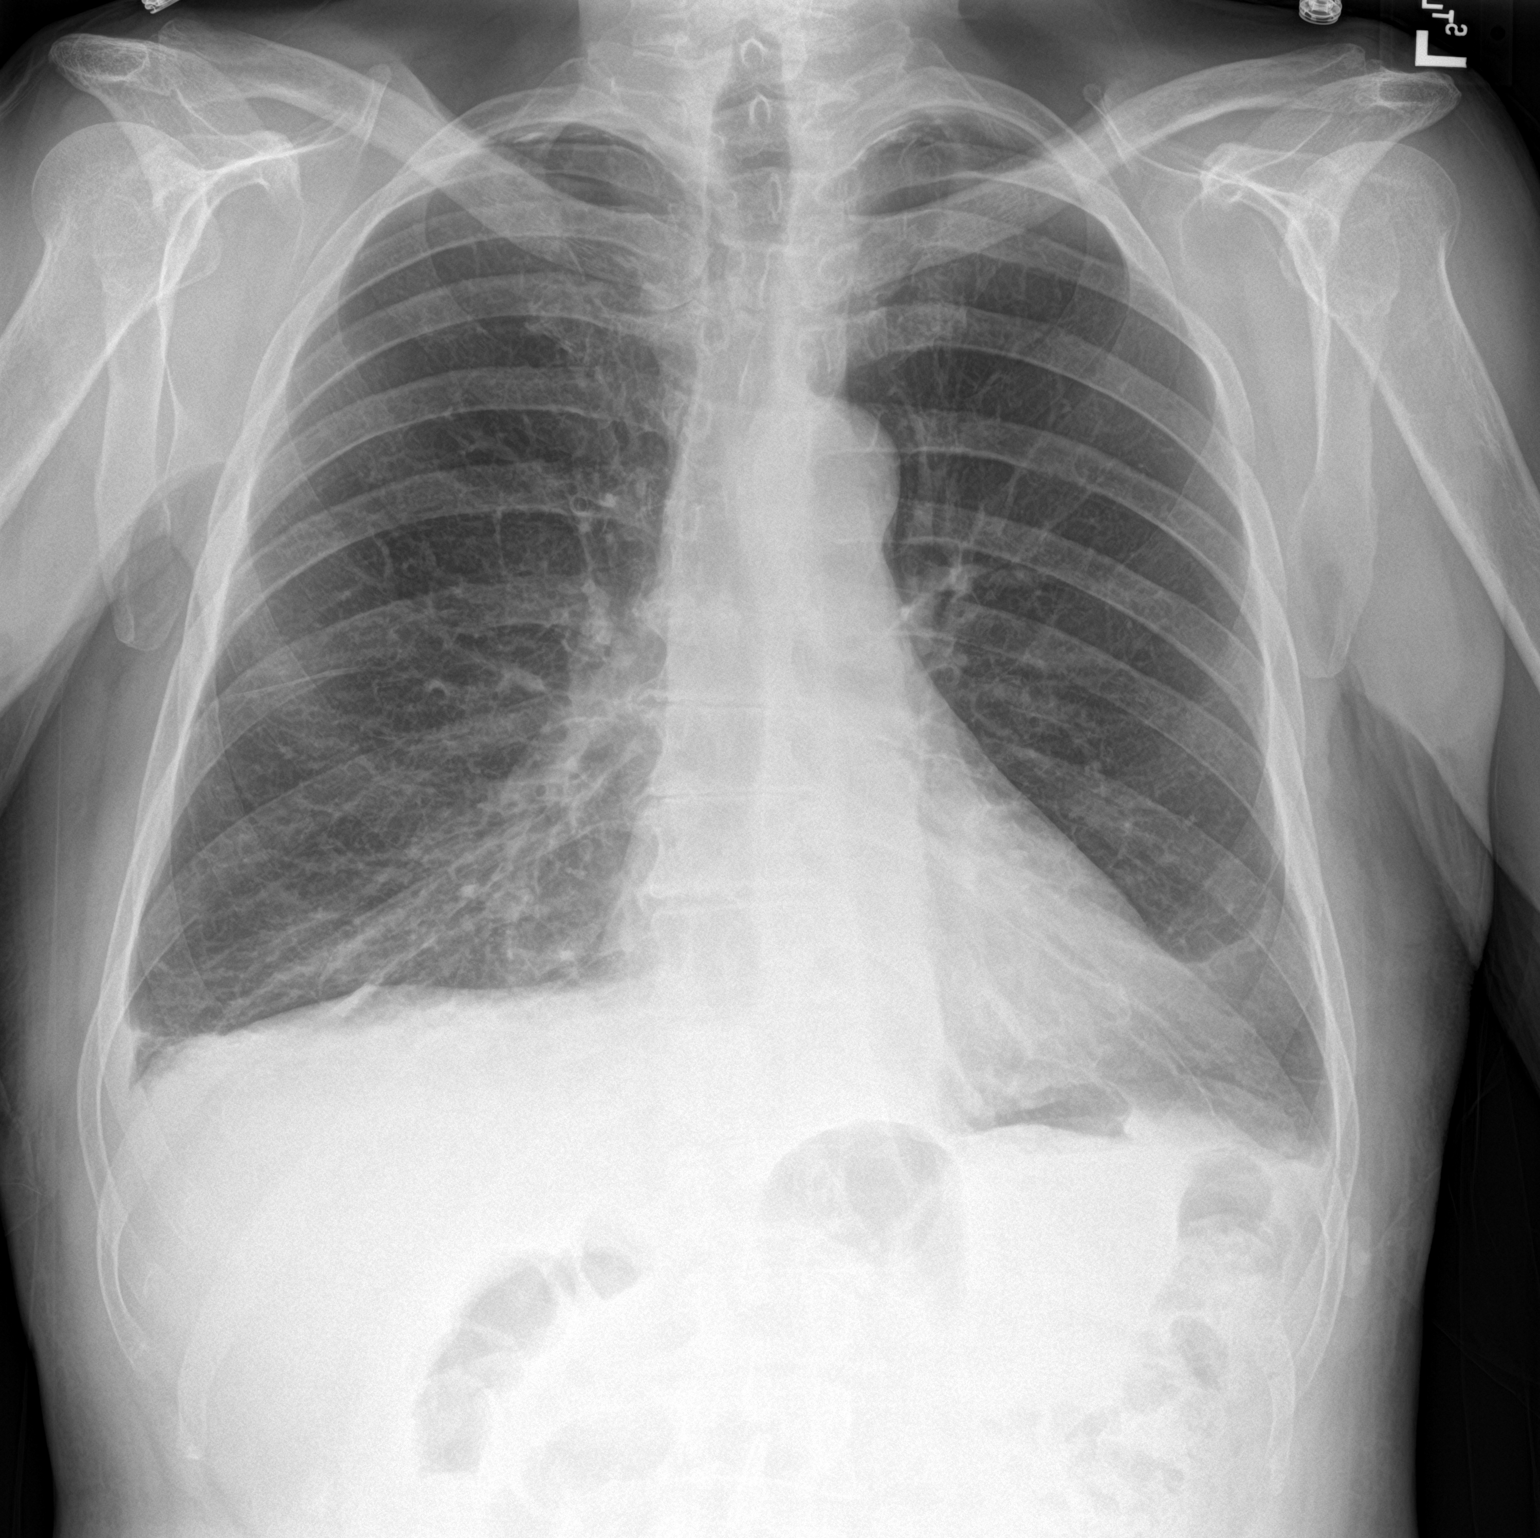

[1 of 1 positions shown; findings below may reference images not displayed]

FINDINGS: No significant residual pleural fluid. No evidence of pneumothorax
or re-expansion edema. Mild atelectatic type opacities at the bases.
Normal heart size and mediastinal contours.
IMPRESSION: No acute finding after thoracentesis.  No visible residual fluid.

## 2019-05-28 IMAGING — US US THORACENTESIS ASP PLEURAL SPACE W/IMG GUIDE
1 series · 4 of 4 positions shown · non-contrast
Comparison: none

INDICATION: Patient with history of CHF, dyspnea, bilateral pleural effusions,
status post right thoracentesis on 12/07/2017. Request now received
for therapeutic left thoracentesis.

[Series 1: us thoracentesis asp pleural space w/img guide · 4 of 4 slices shown]
[im 1/4]
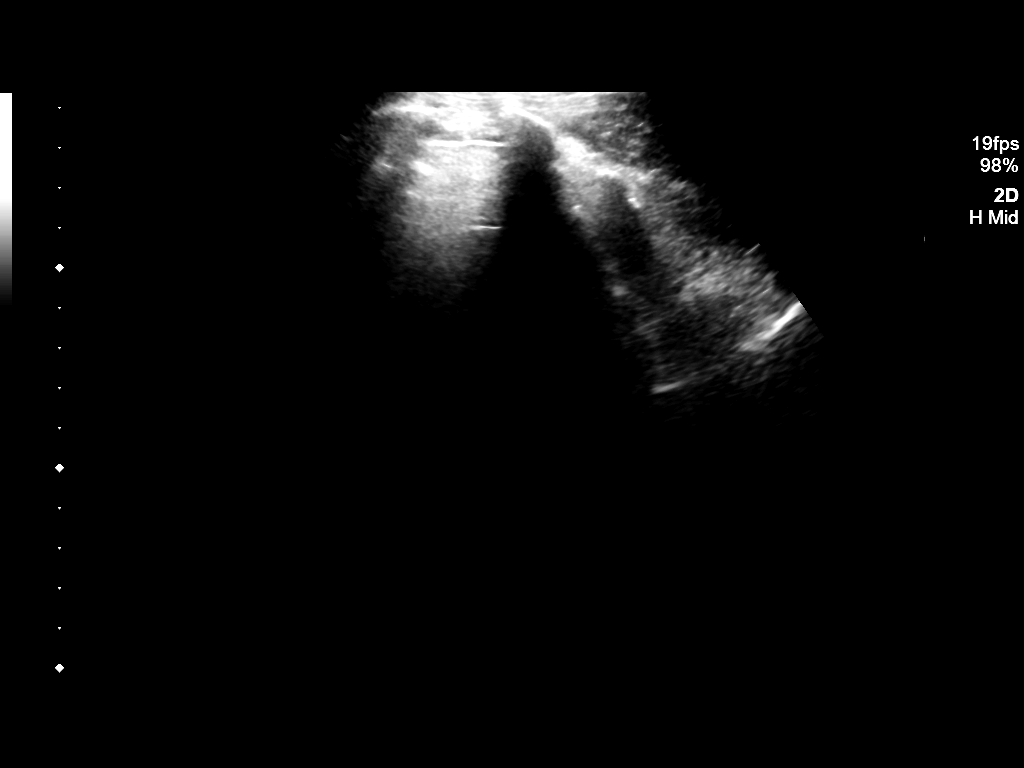
[im 2/4]
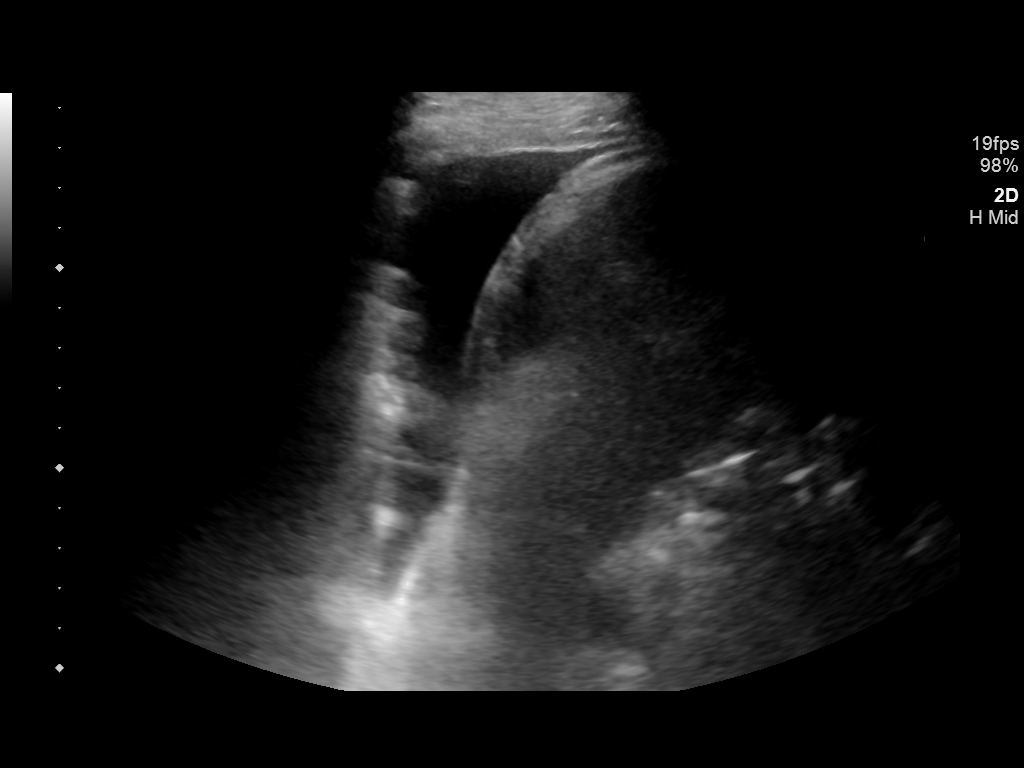
[im 3/4]
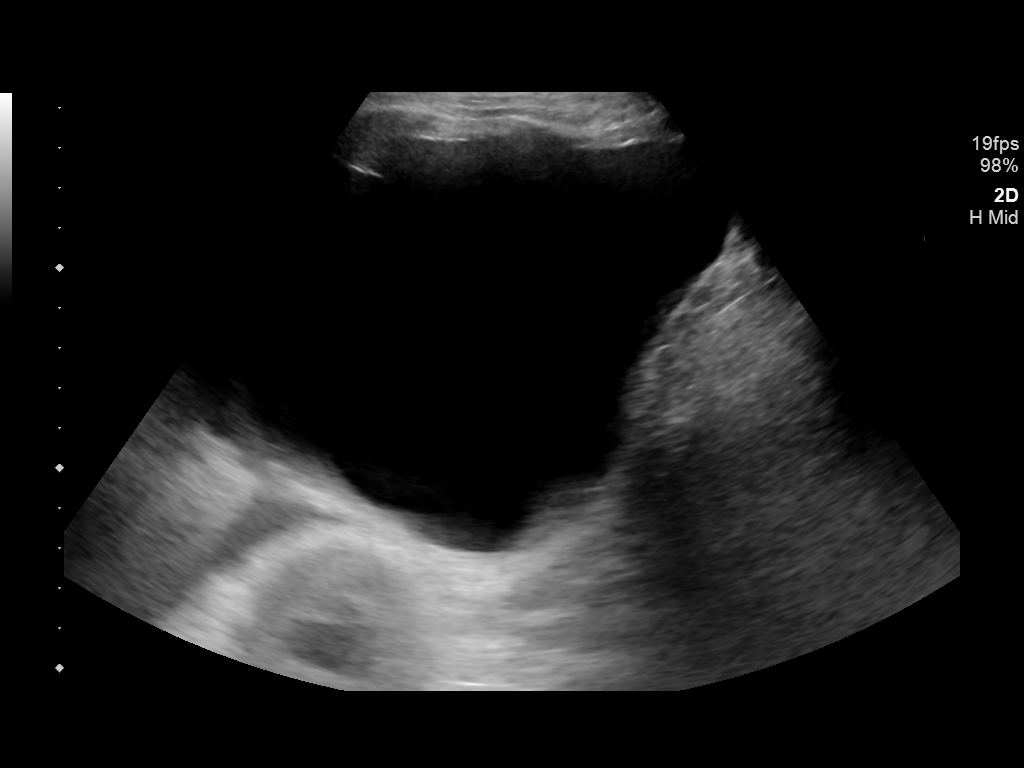
[im 4/4]
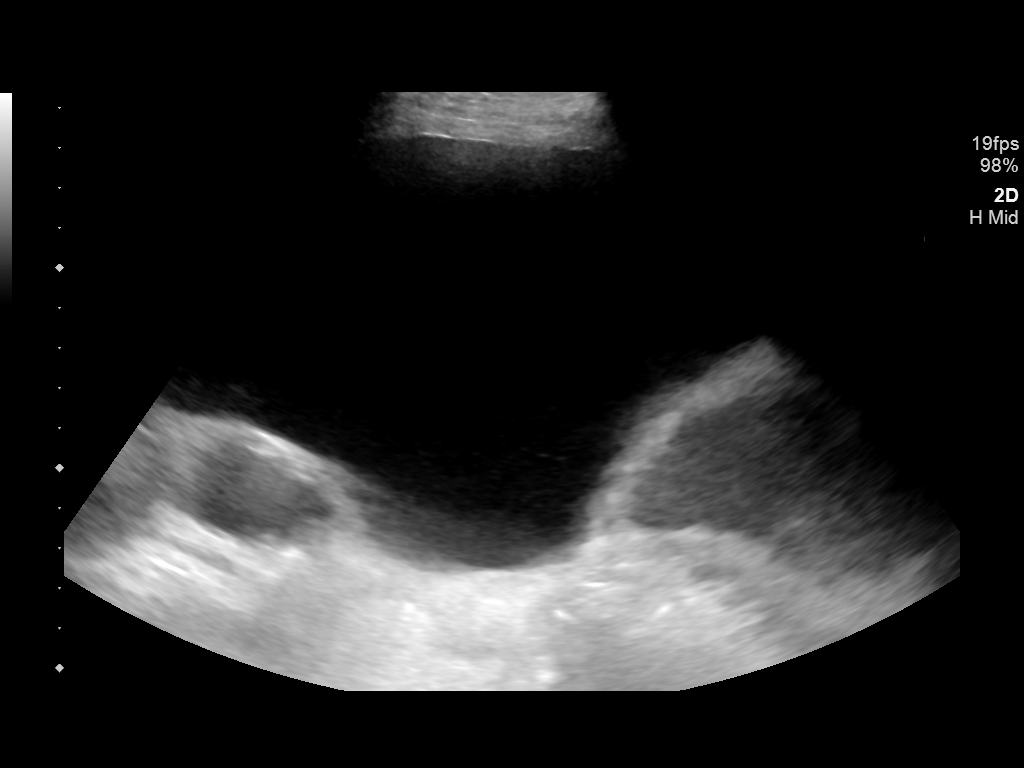

[4 of 4 positions shown; findings below may reference images not displayed]

EXAM:
ULTRASOUND GUIDED THERAPEUTIC LEFT THORACENTESIS

MEDICATIONS:
None

COMPLICATIONS:
None immediate.

PROCEDURE:
An ultrasound guided thoracentesis was thoroughly discussed with the
patient and questions answered. The benefits, risks, alternatives
and complications were also discussed. The patient understands and
wishes to proceed with the procedure. Written consent was obtained.

Ultrasound was performed to localize and mark an adequate pocket of
fluid in the left chest. The area was then prepped and draped in the
normal sterile fashion. 1% Lidocaine was used for local anesthesia.
Under ultrasound guidance a 6 Fr Safe-T-Centesis catheter was
introduced. Thoracentesis was performed. The catheter was removed
and a dressing applied.
FINDINGS: A total of approximately 1.7 liters of yellow fluid was removed.
IMPRESSION: Successful ultrasound guided therapeutic left thoracentesis yielding
1.7 liters of pleural fluid.

No pneumothorax on follow-up radiograph.

## 2019-05-28 NOTE — Patient Instructions (Addendum)
Increase Torsemide to 60 mg Twice daily FOR 2 DAYS ONLY  IF this helps then you will need to:  Take Torsemide 60 mg in AM and 40 mg in PM as new dose  Increase Potassium to 60 meq Twice daily   CALL or send a message through MyChart to let us know  Get repeat lab work in 2 weeks after being on this dose   Your physician recommends that you schedule a follow-up appointment in: 3 months  If you have any questions or concerns before your next appointment please send [place] a message through [place] or call our office at [phone].  At the [facility], you and your health needs are our priority. As part of our continuing mission to provide you with exceptional heart care, we have created designated Provider Care Teams. These Care Teams include your primary Cardiologist (physician) and Advanced Practice Providers (APPs- Physician Assistants and Nurse Practitioners) who all work together to provide you with the care you need, when you need it.   You may see any of the following providers on your designated Care Team at your next follow up: Marland Kitchen Dr Glori Bickers . Dr Loralie Champagne . Darrick Grinder, NP . Lyda Jester, PA   Please be sure to bring in all your medications bottles to every appointment.

## 2019-05-28 NOTE — Progress Notes (Signed)
Heart Failure Clinic Note   Date:  05/28/2019   ID:  CURTIES SASSAMAN, DOB 09/14/1931, MRN [id number]  Location: Home  Provider location: [place] [facility] Type of Visit: Established patient, Add on  PCP:  Wenda Low, MD  Primary HF: Dr [name]  Chief Complaint: Fall   History of Present Illness:  Mr. Canela is an 85 y/o male with h/o temporal arteritis/polymalgia rheumatica, previous TIA/CVA, and recurrent pleural effusions s/p bilateral Pleurex tubes.  He was last seen in HF clinic 07/30/2018. He was doing fine. K was low and he was started on daily supp. Recheck K 2/21 was stable 3.9.   He had a fall off a ladder on 08/07/18. He fell off 5 foot ladder after reaching up and causing the ladder to fall. Denies LOC. He hit his head. CXR negative for fracture, head CT negative for acute changes. Zio Patch 4/20 without any acute episodes.    Today he returns for HF follow up. Here with his wife. Overall feeling fine. Able to walk 1 mile twice a day - walking about 10 miles a week at [facility]. No real SOB. Denies PND/Orthopnea. Weight stable. Taking all medications. Continues to drain from Pleurex catheters L 3 times a week and right 2x/week. About 2L total. Last visit we briefly discussed trying octreotide but not started yet. Takes torsemide 40 bid. Still with edema     Past Medical History:  Diagnosis Date   Actinic keratoses    Arthritis    Chronic bilateral pleural effusions    Chronic sinusitis    Colon polyps    Dyspnea 07/13/2013   BNP normal   Edema 07/13/2013   GERD (gastroesophageal reflux disease)    omeprazole 1-2 times daily   H/O TIA (transient ischemic attack) and stroke    on statins and plavix -saw Dr.Sethi in the past   Headache    History of BPH    History of TIA (transient ischemic attack) 07/13/2013   Left inguinal hernia    Left inguinal hernia 01/27/2019   Lumbar radiculopathy    Need for shingles vaccine     Optic neuritis 10/2015   left eye   Perennial allergic rhinitis    Polymyalgia rheumatica ([place]) 07/13/2013   Daily prednisone   Restless leg syndrome    Retinal disease    right eye since birth    Shingles    Temporal arteritis ([place]) 10/2015   Wears glasses    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   Past Surgical History:  Procedure Laterality Date   BRAVO [place] STUDY N/A 04/18/2016   Procedure: BRAVO [place] STUDY;  Surgeon: Wilford Corner, MD;  Location: [facility];  Service: Endoscopy;  Laterality: N/A;   CHEST TUBE INSERTION Right 02/13/2018   Procedure: INSERTION PLEURAL DRAINAGE CATHETER;  Surgeon: Ivin Poot, MD;  Location: [place];  Service: Thoracic;  Laterality: Right;   COLONOSCOPY     drropy eyelid surgery     ESOPHAGOGASTRODUODENOSCOPY (EGD) WITH PROPOFOL N/A 04/18/2016   Procedure: ESOPHAGOGASTRODUODENOSCOPY (EGD) WITH PROPOFOL;  Surgeon: Wilford Corner, MD;  [facility];  Service: Endoscopy;  Laterality: N/A;   INGUINAL HERNIA REPAIR Left 01/27/2019   Procedure: OPEN REPAIR LEFT INGUINAL HERNIA WITH MESH;  Surgeon: Fanny Skates, MD;  Location: [place];  Service: General;  Laterality: Left;  GENERAL AND TAP BLOCK ANESTHESIA   INSERTION OF MESH Left 01/27/2019   Procedure: Insertion Of Mesh;  Surgeon: Fanny Skates, MD;  [facility];  Service: General;  Laterality: Left;   IR THORACENTESIS ASP PLEURAL SPACE W/IMG GUIDE  02/04/2018   IR THORACENTESIS ASP PLEURAL SPACE W/IMG GUIDE  02/06/2018   RIGHT/LEFT HEART CATH AND CORONARY ANGIOGRAPHY N/A 06/24/2018   Procedure: RIGHT/LEFT HEART CATH AND CORONARY ANGIOGRAPHY;  Surgeon: Jolaine Artist, MD;  Location: [place] [facility];  Service: Cardiovascular;  Laterality: N/A;   VASECTOMY       Current Outpatient Medications  Medication Sig Dispense Refill   acetaminophen (TYLENOL) 500 MG tablet Take 500 mg by mouth daily as needed for moderate pain or headache.     calcium carbonate (TUMS - DOSED IN  MG ELEMENTAL CALCIUM) 500 MG chewable tablet Chew 1 tablet by mouth daily as needed for indigestion.     cetirizine (ZYRTEC) 10 MG tablet Take 10 mg by mouth daily. As needed for allergies     clopidogrel (PLAVIX) 75 MG tablet Take 75 mg by mouth daily.      doxazosin (CARDURA) 8 MG tablet Take 8 mg by mouth daily.     Ergocalciferol (VITAMIN D2 PO) Take 5,000 Units by mouth daily.     Multiple Vitamins-Minerals (MULTIVITAMIN ADULT PO) Take 1 tablet by mouth daily.     Omega-3 Fatty Acids (FISH OIL PO) Take 2 capsules by mouth daily.     pantoprazole (PROTONIX) 40 MG tablet Take 40 mg by mouth daily.     Polyethyl Glycol-Propyl Glycol (SYSTANE OP) Place 1 drop into both eyes 2 (two) times daily as needed (dry eyes).     potassium chloride SA (K-DUR) 20 MEQ tablet Take 3 tablets (60 mEq total) by mouth every morning AND 2 tablets (40 mEq total) every evening. (Patient taking differently: Take 60 mEq total by mouth every morning AND 40 mEq every evening.) 460 tablet 3   simvastatin (ZOCOR) 20 MG tablet Take 20 mg by mouth daily.     torsemide (DEMADEX) 20 MG tablet Take 2 tablets (40 mg total) by mouth 2 (two) times daily. 360 tablet 3   No current facility-administered medications for this encounter.     Allergies:   Patient has no known allergies.   Social History:  The patient  reports that he quit smoking about 43 years ago. His smoking use included cigarettes. He has a 15.00 pack-year smoking history. He has never used smokeless tobacco. He reports current alcohol use of about 7.0 standard drinks of alcohol per week. He reports that he does not use drugs.   Family History:  The patient's family history includes Breast cancer in his mother; Coronary artery disease in his father and mother; Diabetes in his father and mother; Berenice Primas' disease in his sister; Heart attack in his father; Stroke in his mother.   ROS:  Please see the history of present illness.   All other systems are  personally reviewed and negative.   Exam: General:  Well appearing. No resp difficulty HEENT: normal Neck: supple. no JVD. Carotids 2+ bilat; no bruits. No lymphadenopathy or thryomegaly appreciated. Cor: PMI nondisplaced. Regular rate & rhythm. No rubs, gallops or murmurs. Lungs: clear mild dullness on R base Abdomen: soft, nontender, nondistended. No hepatosplenomegaly. No bruits or masses. Good bowel sounds. Extremities: no cyanosis, clubbing, rash, 2+ edema + yellow dystrophic  fingernails  Neuro: alert & orientedx3, cranial nerves grossly intact. moves all 4 extremities w/o difficulty. Affect pleasant   Recent Labs: 11/06/2018: Magnesium 2.2 02/23/2019: ALT 10; BUN 28; Creatinine, Ser 1.05; Hemoglobin 12.4; Platelets 317; Potassium  3.7; Sodium 139  Personally reviewed   Wt Readings from Last 3 Encounters:  05/28/19 68.3 kg (150 lb 9.6 oz)  05/20/19 68.9 kg (151 lb 12.8 oz)  04/02/19 67.6 kg (149 lb)      ASSESSMENT AND PLAN:  1. Chronic diastolic HF - NYHA I-II. Has residual LE edema - increase torsemide to 60 bid for 2-3 days. If has good response will increase from 40 bid to 60/40 going forward - increase potassium - check labs today and 2 weeks  2. 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 - Myeloma panel with small M-spike with IgG monoclonal with kappa light chains. Likely MGUS - S/p bilat pleurex tubes with stable drainage - Followed closely by Dr Lawson Fiscal.  - Considering somatostatin. I have discussed with Pharmacy -> will try to get approval for octreotid injections  3. Coronary calcifications and abnormal stress test - No s/s ischemia.  - Left heart cath as above 12/19 minimal CAD  Signed, Glori Bickers, MD  05/28/2019 2:03 PM  [place] [address] [facility] [address] [phone] (office) [phone] (fax)

## 2019-05-29 DIAGNOSIS — J918 Pleural effusion in other conditions classified elsewhere: Secondary | ICD-10-CM | POA: Diagnosis not present

## 2019-06-02 DIAGNOSIS — H25012 Cortical age-related cataract, left eye: Secondary | ICD-10-CM | POA: Diagnosis not present

## 2019-06-02 DIAGNOSIS — H25812 Combined forms of age-related cataract, left eye: Secondary | ICD-10-CM | POA: Diagnosis not present

## 2019-06-02 DIAGNOSIS — H2512 Age-related nuclear cataract, left eye: Secondary | ICD-10-CM | POA: Diagnosis not present

## 2019-06-25 DIAGNOSIS — J918 Pleural effusion in other conditions classified elsewhere: Secondary | ICD-10-CM | POA: Diagnosis not present

## 2019-06-30 ENCOUNTER — Other Ambulatory Visit: Payer: Self-pay | Admitting: Cardiothoracic Surgery

## 2019-06-30 DIAGNOSIS — J90 Pleural effusion, not elsewhere classified: Secondary | ICD-10-CM

## 2019-07-01 ENCOUNTER — Ambulatory Visit (INDEPENDENT_AMBULATORY_CARE_PROVIDER_SITE_OTHER): Payer: Medicare Other | Admitting: Cardiothoracic Surgery

## 2019-07-01 ENCOUNTER — Ambulatory Visit
Admission: RE | Admit: 2019-07-01 | Discharge: 2019-07-01 | Disposition: A | Discharge status: Home / Self Care | Payer: Medicare Other | Source: Ambulatory Visit | Attending: Cardiothoracic Surgery | Admitting: Cardiothoracic Surgery

## 2019-07-01 ENCOUNTER — Other Ambulatory Visit: Payer: Self-pay

## 2019-07-01 ENCOUNTER — Encounter: Payer: Self-pay | Admitting: Cardiothoracic Surgery

## 2019-07-01 VITALS — BP 117/63 | HR 63 | Temp 97.9°F | Resp 20 | Ht 66.0 in | Wt 152.0 lb

## 2019-07-01 DIAGNOSIS — I2584 Coronary atherosclerosis due to calcified coronary lesion: Secondary | ICD-10-CM

## 2019-07-01 DIAGNOSIS — J90 Pleural effusion, not elsewhere classified: Secondary | ICD-10-CM

## 2019-07-01 DIAGNOSIS — Z9689 Presence of other specified functional implants: Secondary | ICD-10-CM

## 2019-07-01 DIAGNOSIS — J9811 Atelectasis: Secondary | ICD-10-CM | POA: Diagnosis not present

## 2019-07-01 DIAGNOSIS — I2510 Atherosclerotic heart disease of native coronary artery without angina pectoris: Secondary | ICD-10-CM | POA: Diagnosis not present

## 2019-07-01 DIAGNOSIS — Z4803 Encounter for change or removal of drains: Secondary | ICD-10-CM | POA: Diagnosis not present

## 2019-07-01 NOTE — Progress Notes (Signed)
PCP is Wenda Low, MD Referring Provider is Lauraine Rinne, NP  Chief Complaint  Patient presents with  . Pleural Effusion    6 week f/u with CXR    HPI: Routine check for bilateral Pleurx catheters placed summer 2019 for yellow nail syndrome and recurrent bilateral pleural effusions. Left Pleurx catheter drainage remains stable with drainage scheduled 3 days a week. Will right Pleurx catheter drainage stable with drainage schedule to twice a week. Total output per week approximately 2.5 L combined. No symptoms of shortness of breath. No issues with the catheters for catheter exit site.  Chest x-ray today is clear with catheters in good position. No significant pleural effusions.  Past Medical History:  Diagnosis Date  . Actinic keratoses   . Arthritis   . Chronic bilateral pleural effusions   . Chronic sinusitis   . Colon polyps   . Dyspnea 07/13/2013   BNP normal  . Edema 07/13/2013  . GERD (gastroesophageal reflux disease)    omeprazole 1-2 times daily  . H/O TIA (transient ischemic attack) and stroke    on statins and plavix -saw Dr.Sethi in the past  . Headache   . History of BPH   . History of TIA (transient ischemic attack) 07/13/2013  . Left inguinal hernia   . Left inguinal hernia 01/27/2019  . Lumbar radiculopathy   . Need for shingles vaccine   . Optic neuritis 10/2015   left eye  . Perennial allergic rhinitis   . Polymyalgia rheumatica ([place]) 07/13/2013   Daily prednisone  . Restless leg syndrome   . Retinal disease    right eye since birth   . Shingles   . Temporal arteritis ([place]) 10/2015  . Wears glasses   . 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 BRAVO [place] STUDY N/A 04/18/2016   Procedure: BRAVO [place];  Surgeon: Wilford Corner, MD;  [facility];  Service: Endoscopy;  Laterality: N/A;  . CHEST TUBE INSERTION Right 02/13/2018   Procedure: INSERTION PLEURAL DRAINAGE CATHETER;  Surgeon: Ivin Poot, MD;  Location: [place] [place];  Service: Thoracic;  Laterality: Right;  . COLONOSCOPY    . drropy eyelid surgery    . ESOPHAGOGASTRODUODENOSCOPY (EGD) WITH PROPOFOL N/A 04/18/2016   Procedure: ESOPHAGOGASTRODUODENOSCOPY (EGD) WITH PROPOFOL;  Surgeon: Wilford Corner, MD;  [facility];  Service: Endoscopy;  Laterality: N/A;  . INGUINAL HERNIA REPAIR Left 01/27/2019   Procedure: OPEN REPAIR LEFT INGUINAL HERNIA WITH MESH;  Surgeon: Fanny Skates, MD;  Location: [facility];  Service: General;  Laterality: Left;  GENERAL AND TAP BLOCK ANESTHESIA  . INSERTION OF MESH Left 01/27/2019   Procedure: Insertion Of Mesh;  Surgeon: Fanny Skates, MD;  Location: [place];  Service: General;  Laterality: Left;  . IR THORACENTESIS ASP PLEURAL SPACE W/IMG GUIDE  02/04/2018  . IR THORACENTESIS ASP PLEURAL SPACE W/IMG GUIDE  02/06/2018  . RIGHT/LEFT HEART CATH AND CORONARY ANGIOGRAPHY N/A 06/24/2018   Procedure: RIGHT/LEFT HEART CATH AND CORONARY ANGIOGRAPHY;  Surgeon: Jolaine Artist, MD;  Location: [facility];  Service: Cardiovascular;  Laterality: N/A;  . VASECTOMY      Family History  Problem Relation Age of Onset  . Stroke Mother   . Coronary artery disease Mother   . Diabetes Mother   . Breast cancer Mother   . Heart attack Father   . Coronary artery disease Father   . Diabetes Father   . Graves' disease  Sister     Social History Social History   Tobacco Use  . Smoking status: Former Smoker    Packs/day: 1.00    Years: 15.00    Pack years: 15.00    Types: Cigarettes    Quit date: 07/23/1975    Years since quitting: 43.9  . Smokeless tobacco: Never Used  Substance Use Topics  . Alcohol use: Yes    Alcohol/week: 7.0 standard drinks    Types: 7 Glasses of wine per week    Comment: 1 glass red wine daily  . Drug use: No    Current Outpatient Medications  Medication Sig Dispense Refill  . acetaminophen (TYLENOL) 500 MG tablet Take 500 mg by mouth daily as needed for  moderate pain or headache.    . calcium carbonate (TUMS - DOSED IN MG ELEMENTAL CALCIUM) 500 MG chewable tablet Chew 1 tablet by mouth daily as needed for indigestion.    . cetirizine (ZYRTEC) 10 MG tablet Take 10 mg by mouth daily. As needed for allergies    . cholecalciferol (VITAMIN D3) 25 MCG (1000 UT) tablet Take 1,000 Units by mouth daily.    . clopidogrel (PLAVIX) 75 MG tablet Take 75 mg by mouth daily.     Marland Kitchen doxazosin (CARDURA) 8 MG tablet Take 8 mg by mouth daily.    . Ergocalciferol (VITAMIN D2 PO) Take 5,000 Units by mouth daily.    . Multiple Vitamins-Minerals (MULTIVITAMIN ADULT PO) Take 1 tablet by mouth daily.    . Omega-3 Fatty Acids (FISH OIL PO) Take 2 capsules by mouth daily.    . pantoprazole (PROTONIX) 40 MG tablet Take 40 mg by mouth daily.    Vladimir Faster Glycol-Propyl Glycol (SYSTANE OP) Place 1 drop into both eyes 2 (two) times daily as needed (dry eyes).    . potassium chloride SA (KLOR-CON) 20 MEQ tablet Take 3 tabs(67meq) by mouth every morning and 2 tabs(78meq) by mouth every evening    . simvastatin (ZOCOR) 20 MG tablet Take 20 mg by mouth daily.    Marland Kitchen torsemide (DEMADEX) 20 MG tablet Take 2 tablets (40 mg total) by mouth 2 (two) times daily. 360 tablet 3  . vitamin C (ASCORBIC ACID) 250 MG tablet Take 250 mg by mouth daily.     No current facility-administered medications for this visit.     No Known Allergies  Review of Systems: No change  BP 117/63 (BP Location: Right Arm)   Pulse 63   Temp 97.9 F (36.6 C) (Skin)   Resp 20   Ht 5\' 6"  (1.676 m)   Wt 152 lb (68.9 kg)   SpO2 97% Comment: RA  BMI 24.53 kg/m  Physical Exam:      Exam    General- alert and comfortable    Neck- no JVD, no cervical adenopathy palpable, no carotid bruit   Lungs- clear without rales, wheezes   Cor- regular rate and rhythm, no murmur , gallop   Abdomen- soft, non-tender   Extremities - warm, non-tender, minimal edema   Neuro- oriented, appropriate, no focal  weakness   Diagnostic Tests: Chest x-ray clear  Impression: Bilateral Pleurx catheters continue to compensate for his recurrent bilateral pleural effusions. With symptomatic relief. No change in weight or strength.  Plan: Continue current drainage schedule. Return in 10 weeks with chest x-ray or call in the interim if issues occur.   Len Childs, MD [facility] [phone]

## 2019-07-27 DIAGNOSIS — Z23 Encounter for immunization: Secondary | ICD-10-CM | POA: Diagnosis not present

## 2019-08-10 DIAGNOSIS — R972 Elevated prostate specific antigen [PSA]: Secondary | ICD-10-CM | POA: Diagnosis not present

## 2019-08-10 DIAGNOSIS — K219 Gastro-esophageal reflux disease without esophagitis: Secondary | ICD-10-CM | POA: Diagnosis not present

## 2019-08-10 DIAGNOSIS — G2581 Restless legs syndrome: Secondary | ICD-10-CM | POA: Diagnosis not present

## 2019-08-10 DIAGNOSIS — L605 Yellow nail syndrome: Secondary | ICD-10-CM | POA: Diagnosis not present

## 2019-08-10 DIAGNOSIS — Z0000 Encounter for general adult medical examination without abnormal findings: Secondary | ICD-10-CM | POA: Diagnosis not present

## 2019-08-10 DIAGNOSIS — I700 Atherosclerosis of aorta: Secondary | ICD-10-CM | POA: Diagnosis not present

## 2019-08-10 DIAGNOSIS — Z1389 Encounter for screening for other disorder: Secondary | ICD-10-CM | POA: Diagnosis not present

## 2019-08-10 DIAGNOSIS — J90 Pleural effusion, not elsewhere classified: Secondary | ICD-10-CM | POA: Diagnosis not present

## 2019-08-10 DIAGNOSIS — G459 Transient cerebral ischemic attack, unspecified: Secondary | ICD-10-CM | POA: Diagnosis not present

## 2019-08-10 DIAGNOSIS — E7800 Pure hypercholesterolemia, unspecified: Secondary | ICD-10-CM | POA: Diagnosis not present

## 2019-08-10 DIAGNOSIS — N400 Enlarged prostate without lower urinary tract symptoms: Secondary | ICD-10-CM | POA: Diagnosis not present

## 2019-08-10 DIAGNOSIS — R600 Localized edema: Secondary | ICD-10-CM | POA: Diagnosis not present

## 2019-08-10 DIAGNOSIS — R7309 Other abnormal glucose: Secondary | ICD-10-CM | POA: Diagnosis not present

## 2019-08-10 DIAGNOSIS — M353 Polymyalgia rheumatica: Secondary | ICD-10-CM | POA: Diagnosis not present

## 2019-08-21 DIAGNOSIS — J918 Pleural effusion in other conditions classified elsewhere: Secondary | ICD-10-CM | POA: Diagnosis not present

## 2019-08-24 DIAGNOSIS — Z23 Encounter for immunization: Secondary | ICD-10-CM | POA: Diagnosis not present

## 2019-08-31 ENCOUNTER — Ambulatory Visit (HOSPITAL_COMMUNITY)
Admission: RE | Admit: 2019-08-31 | Discharge: 2019-08-31 | Disposition: A | Discharge status: Home / Self Care | Payer: Medicare Other | Source: Ambulatory Visit | Attending: Internal Medicine | Admitting: Internal Medicine

## 2019-08-31 ENCOUNTER — Encounter (HOSPITAL_COMMUNITY): Payer: Self-pay | Admitting: Internal Medicine

## 2019-08-31 ENCOUNTER — Other Ambulatory Visit: Payer: Self-pay

## 2019-08-31 VITALS — BP 112/48 | HR 98 | Wt 153.4 lb

## 2019-08-31 DIAGNOSIS — R0600 Dyspnea, unspecified: Secondary | ICD-10-CM | POA: Insufficient documentation

## 2019-08-31 DIAGNOSIS — J90 Pleural effusion, not elsewhere classified: Secondary | ICD-10-CM

## 2019-08-31 DIAGNOSIS — R600 Localized edema: Secondary | ICD-10-CM

## 2019-08-31 DIAGNOSIS — L605 Yellow nail syndrome: Secondary | ICD-10-CM

## 2019-08-31 DIAGNOSIS — I2510 Atherosclerotic heart disease of native coronary artery without angina pectoris: Secondary | ICD-10-CM

## 2019-08-31 DIAGNOSIS — I2584 Coronary atherosclerosis due to calcified coronary lesion: Secondary | ICD-10-CM

## 2019-08-31 LAB — COMPREHENSIVE METABOLIC PANEL
ALT: 12 U/L (ref 0–44)
AST: 18 U/L (ref 15–41)
Albumin: 2.8 g/dL — ABNORMAL LOW (ref 3.5–5.0)
Alkaline Phosphatase: 42 U/L (ref 38–126)
Anion gap: 11 (ref 5–15)
BUN: 21 mg/dL (ref 8–23)
CO2: 27 mmol/L (ref 22–32)
Calcium: 8.3 mg/dL — ABNORMAL LOW (ref 8.9–10.3)
Chloride: 103 mmol/L (ref 98–111)
Creatinine, Ser: 1.4 mg/dL — ABNORMAL HIGH (ref 0.61–1.24)
GFR calc Af Amer: 53 mL/min — ABNORMAL LOW (ref >60)
GFR calc non Af Amer: 45 mL/min — ABNORMAL LOW (ref >60)
Glucose, Bld: 106 mg/dL — ABNORMAL HIGH (ref 70–99)
Potassium: 3.8 mmol/L (ref 3.5–5.1)
Sodium: 141 mmol/L (ref 135–145)
Total Bilirubin: 0.3 mg/dL (ref 0.3–1.2)
Total Protein: 5.9 g/dL — ABNORMAL LOW (ref 6.5–8.1)

## 2019-08-31 MED ORDER — TORSEMIDE 20 MG PO TABS: ORAL_TABLET | ORAL | 3 refills | Status: DC

## 2019-08-31 MED ORDER — POTASSIUM CHLORIDE CRYS ER 20 MEQ PO TBCR: 60.0000 meq | EXTENDED_RELEASE_TABLET | Freq: Two times a day (BID) | ORAL | 3 refills | Status: DC

## 2019-08-31 NOTE — Patient Instructions (Signed)
Increase Torsemide to 60 mg (3 tabs) in AM and 40 mg (2 tabs) in PM  Increase Potassium to 60 meq (3 tabs) Twice daily   Labs done today, your results will be available in MyChart, we will contact you for abnormal readings.  If you have any questions or concerns before your next appointment please send [place] a message through [place] or call our office at [phone].  At the [facility], you and your health needs are our priority. As part of our continuing mission to provide you with exceptional heart care, we have created designated Provider Care Teams. These Care Teams include your primary Cardiologist (physician) and Advanced Practice Providers (APPs- Physician Assistants and Nurse Practitioners) who all work together to provide you with the care you need, when you need it.   You may see any of the following providers on your designated Care Team at your next follow up: Marland Kitchen Dr Glori Bickers . Dr Loralie Champagne . Darrick Grinder, NP . Lyda Jester, PA . Audry Riles, PharmD   Please be sure to bring in all your medications bottles to every appointment.

## 2019-08-31 NOTE — Progress Notes (Signed)
ReDS Vest / Clip - 08/31/19 1500      ReDS Vest / Clip   Station Marker  C    Ruler Value  27    ReDS Value Range  Low volume    ReDS Actual Value  29    Anatomical Comments  sitting

## 2019-08-31 NOTE — Progress Notes (Addendum)
Heart Failure Clinic Note   Date:  08/31/2019   [name], DOB [birth year], MRN [id number]  Location: Home  Provider location:  Advanced Heart Failure Type of Visit: Established patient, Add on  PCP:  Wenda Low, MD  Primary HF: Dr [name]  Chief Complaint: Fall   History of Present Illness:  Stuart Watson is an 86 y/o male with h/o temporal arteritis/polymalgia rheumatica, previous TIA/CVA, and recurrent pleural effusions s/p bilateral Pleurex tubes.  He was last seen in HF clinic 07/30/2018. He was doing fine. K was low and he was started on daily supp. Recheck K 2/21 was stable 3.9.   He had a fall off a ladder on 08/07/18. He fell off 5 foot ladder after reaching up and causing the ladder to fall. Denies LOC. He hit his head. CXR negative for fracture, head CT negative for acute changes. Zio Patch 4/20 without any acute episodes.   Today he returns for HF follow up. Here with his wife. Overall feeling ok. Still walking 1 mile usually 2x/day at [facility]. After last visit he increased his torsemide for 2 days but did not see much change in Pleurex drainage. Has been following closely typically about 1500 cc per week on left and 750-100 on right. Takes torsemide 40 bid. Still with LR edema. No orthopnea or PND.    Past Medical History:  Diagnosis Date  . Actinic keratoses   . Arthritis   . Chronic bilateral pleural effusions   . Chronic sinusitis   . Colon polyps   . Dyspnea 07/13/2013   BNP normal  . Edema 07/13/2013  . GERD (gastroesophageal reflux disease)    omeprazole 1-2 times daily  . H/O TIA (transient ischemic attack) and stroke    on statins and plavix -saw Dr.Sethi in the past  . Headache   . History of BPH   . History of TIA (transient ischemic attack) 07/13/2013  . Left inguinal hernia   . Left inguinal hernia 01/27/2019  . Lumbar radiculopathy   . Need for shingles vaccine   . Optic neuritis 10/2015   left eye  . Perennial  allergic rhinitis   . Polymyalgia rheumatica ([place]) 07/13/2013   Daily prednisone  . Restless leg syndrome   . Retinal disease    right eye since birth   . Shingles   . Temporal arteritis ([place]) 10/2015  . Wears glasses   . 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 BRAVO [place] STUDY N/A 04/18/2016   Procedure: BRAVO [place];  Surgeon: Wilford Corner, MD;  [facility];  Service: Endoscopy;  Laterality: N/A;  . CHEST TUBE INSERTION Right 02/13/2018   Procedure: INSERTION PLEURAL DRAINAGE CATHETER;  Surgeon: Ivin Poot, MD;  Location: [facility];  Service: Thoracic;  Laterality: Right;  . COLONOSCOPY    . drropy eyelid surgery    . ESOPHAGOGASTRODUODENOSCOPY (EGD) WITH PROPOFOL N/A 04/18/2016   Procedure: ESOPHAGOGASTRODUODENOSCOPY (EGD) WITH PROPOFOL;  Surgeon: Wilford Corner, MD;  [facility];  Service: Endoscopy;  Laterality: N/A;  . INGUINAL HERNIA REPAIR Left 01/27/2019   Procedure: OPEN REPAIR LEFT INGUINAL HERNIA WITH MESH;  Surgeon: Fanny Skates, MD;  Location: [facility];  Service: General;  Laterality: Left;  GENERAL AND TAP BLOCK ANESTHESIA  . INSERTION OF MESH Left 01/27/2019   Procedure: Insertion Of Mesh;  Surgeon: Fanny Skates, MD;  Location: [facility];  Service: General;  Laterality: Left;  .  IR THORACENTESIS ASP PLEURAL SPACE W/IMG GUIDE  02/04/2018  . IR THORACENTESIS ASP PLEURAL SPACE W/IMG GUIDE  02/06/2018  . RIGHT/LEFT HEART CATH AND CORONARY ANGIOGRAPHY N/A 06/24/2018   Procedure: RIGHT/LEFT HEART CATH AND CORONARY ANGIOGRAPHY;  Surgeon: Jolaine Artist, MD;  Location: [place] [facility];  Service: Cardiovascular;  Laterality: N/A;  . VASECTOMY       Current Outpatient Medications  Medication Sig Dispense Refill  . acetaminophen (TYLENOL) 500 MG tablet Take 500 mg by mouth daily as needed for moderate pain or headache.    . calcium carbonate (TUMS - DOSED IN MG ELEMENTAL CALCIUM) 500 MG chewable tablet  Chew 1 tablet by mouth daily as needed for indigestion.    . cetirizine (ZYRTEC) 10 MG tablet Take 10 mg by mouth daily. As needed for allergies    . cholecalciferol (VITAMIN D3) 25 MCG (1000 UT) tablet Take 1,000 Units by mouth daily.    . clopidogrel (PLAVIX) 75 MG tablet Take 75 mg by mouth daily.     Marland Kitchen doxazosin (CARDURA) 8 MG tablet Take 8 mg by mouth daily.    . Omega-3 Fatty Acids (FISH OIL PO) Take 2 capsules by mouth daily.    . pantoprazole (PROTONIX) 20 MG tablet Take 20 mg by mouth daily.     Vladimir Faster Glycol-Propyl Glycol (SYSTANE OP) Place 1 drop into both eyes 2 (two) times daily as needed (dry eyes).    . potassium chloride SA (KLOR-CON) 20 MEQ tablet Take 3 tabs(76meq) by mouth every morning and 2 tabs(48meq) by mouth every evening    . simvastatin (ZOCOR) 20 MG tablet Take 20 mg by mouth daily.    Marland Kitchen torsemide (DEMADEX) 20 MG tablet Take 2 tablets (40 mg total) by mouth 2 (two) times daily. 360 tablet 3  . vitamin C (ASCORBIC ACID) 250 MG tablet Take 250 mg by mouth daily.     No current facility-administered medications for this encounter.    Allergies:   Patient has no known allergies.   Social History:  The patient  reports that he quit smoking about 44 years ago. His smoking use included cigarettes. He has a 15.00 pack-year smoking history. He has never used smokeless tobacco. He reports current alcohol use of about 7.0 standard drinks of alcohol per week. He reports that he does not use drugs.   Family History:  The patient's family history includes Breast cancer in his mother; Coronary artery disease in his father and mother; Diabetes in his father and mother; Berenice Primas' disease in his sister; Heart attack in his father; Stroke in his mother.   ROS:  Please see the history of present illness.   All other systems are personally reviewed and negative.   Vitals:   08/31/19 1337  BP: (!) 112/48  Pulse: 98    Exam: General:  Well appearing. No resp difficulty HEENT:  normal Neck: supple. JVP 6-7 Carotids 2+ bilat; no bruits. No lymphadenopathy or thryomegaly appreciated. Cor: PMI nondisplaced. Regular rate & rhythm. No rubs, gallops or murmurs. Lungs: clear  Pleurex tubes in place Abdomen: soft, nontender, nondistended. No hepatosplenomegaly. No bruits or masses. Good bowel sounds. Extremities: no cyanosis, clubbing, rash, 1-2+ edema dystrophic nails Neuro: alert & orientedx3, cranial nerves grossly intact. moves all 4 extremities w/o difficulty. Affect pleasant   Recent Labs: 11/06/2018: Magnesium 2.2 02/23/2019: ALT 10; Hemoglobin 12.4; Platelets 317 05/28/2019: B Natriuretic Peptide 33.9; BUN 22; Creatinine, Ser 1.07; Potassium 4.2; Sodium 139  Personally reviewed   Wt  Readings from Last 3 Encounters:  08/31/19 69.6 kg (153 lb 6.4 oz)  07/01/19 68.9 kg (152 lb)  05/28/19 68.3 kg (150 lb 9.6 oz)      ASSESSMENT AND PLAN:  1. Chronic diastolic HF - NYHA I-II. Has residual LE edema and pleural effusions draining more - increase from 40 bid to 60/40 going forward - increase potassium to 80meq bid - check labs today and 2 weeks  2. 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 - Myeloma panel with small M-spike with IgG monoclonal with kappa light chains. Likely MGUS - S/p bilat pleurex tubes with stable drainage - Followed closely by Dr [name]  - Considering somatostatin. I have discussed with Pharmacy again. Insurance has denied several times but will reapply given worsening pleural effusion. Discussed with PharmD personally today.  3. Coronary calcifications and abnormal stress test - No s/s ischemia - Left heart cath as above 12/19 minimal CAD  Signed, Glori Bickers, MD  08/31/2019 2:03 PM  [facility] [address] Heart and [place] [address] [phone] (office) [phone] (fax)  Addendum: Patient has received approval for somatostatin  (octreotide injections) but not able to get depot injection. I have d/w with him and his wife. They will need nursing assistance to help with home injections and education. Will arrange for [facility] to assist.   Glori Bickers, MD  4:43 PM

## 2019-09-01 ENCOUNTER — Telehealth (HOSPITAL_COMMUNITY): Payer: Self-pay | Admitting: Pharmacist

## 2019-09-01 NOTE — Telephone Encounter (Signed)
Patient Advocate Encounter   Received notification from [employer] that prior authorization for Octreotide for yellow-nail syndrome is required.   PA submitted on CoverMyMeds Key  [id number] Status is pending   Will continue to follow.  Audry Riles, PharmD, BCPS, BCCP, CPP Heart Failure Clinic Pharmacist [phone]

## 2019-09-06 ENCOUNTER — Encounter (HOSPITAL_COMMUNITY): Payer: Self-pay | Admitting: Internal Medicine

## 2019-09-11 NOTE — Telephone Encounter (Signed)
Prior authorization for octreotide has been denied.   Audry Riles, PharmD, BCPS, BCCP, CPP Heart Failure Clinic Pharmacist [phone]

## 2019-09-16 ENCOUNTER — Telehealth (HOSPITAL_COMMUNITY): Payer: Self-pay | Admitting: *Deleted

## 2019-09-16 DIAGNOSIS — L605 Yellow nail syndrome: Secondary | ICD-10-CM

## 2019-09-16 DIAGNOSIS — I5032 Chronic diastolic (congestive) heart failure: Secondary | ICD-10-CM

## 2019-09-16 DIAGNOSIS — J918 Pleural effusion in other conditions classified elsewhere: Secondary | ICD-10-CM | POA: Diagnosis not present

## 2019-09-16 NOTE — Telephone Encounter (Signed)
Per Sherene Sires D pt's Octreotide has been approved and pt will be able to get it from the pharmacy but will need education on administering the injection.  Discussed w/[facility] they can provide [facility] nursing to educate pt on injection.  Pt is aware and agreeable.  HH orders placed

## 2019-09-16 NOTE — Addendum Note (Signed)
Encounter addended by: Jolaine Artist, MD on: 09/16/2019 4:44 PM  Actions taken: Clinical Note Signed

## 2019-09-17 MED ORDER — OCTREOTIDE ACETATE 50 MCG/ML IJ SOLN: INTRAMUSCULAR | 3 refills | Status: DC

## 2019-09-17 NOTE — Telephone Encounter (Signed)
Spoke to patient regarding octreotide injections. Although he would be able to pay the $182.00/month cash price for the injections, he is concerned that doing injections twice daily will be too time consuming. He also states his wife is not comfortable administering the injections. Since he feels like the increase in torsemide greatly decreased the drainage, they would like to hold off on the injections at this time.    Audry Riles, PharmD, BCPS, BCCP, CPP Heart Failure Clinic Pharmacist [phone]

## 2019-09-30 ENCOUNTER — Ambulatory Visit: Payer: Medicare Other | Admitting: Cardiothoracic Surgery

## 2019-10-05 DIAGNOSIS — R946 Abnormal results of thyroid function studies: Secondary | ICD-10-CM | POA: Diagnosis not present

## 2019-10-07 ENCOUNTER — Ambulatory Visit
Admission: RE | Admit: 2019-10-07 | Discharge: 2019-10-07 | Disposition: A | Discharge status: Home / Self Care | Payer: Medicare Other | Source: Ambulatory Visit | Attending: Cardiothoracic Surgery | Admitting: Cardiothoracic Surgery

## 2019-10-07 ENCOUNTER — Encounter: Payer: Self-pay | Admitting: Cardiothoracic Surgery

## 2019-10-07 ENCOUNTER — Ambulatory Visit (INDEPENDENT_AMBULATORY_CARE_PROVIDER_SITE_OTHER): Payer: Medicare Other | Admitting: Cardiothoracic Surgery

## 2019-10-07 ENCOUNTER — Other Ambulatory Visit: Payer: Self-pay

## 2019-10-07 DIAGNOSIS — Z9689 Presence of other specified functional implants: Secondary | ICD-10-CM | POA: Diagnosis not present

## 2019-10-07 DIAGNOSIS — I2510 Atherosclerotic heart disease of native coronary artery without angina pectoris: Secondary | ICD-10-CM

## 2019-10-07 DIAGNOSIS — I2584 Coronary atherosclerosis due to calcified coronary lesion: Secondary | ICD-10-CM | POA: Diagnosis not present

## 2019-10-07 DIAGNOSIS — J90 Pleural effusion, not elsewhere classified: Secondary | ICD-10-CM

## 2019-10-07 DIAGNOSIS — R05 Cough: Secondary | ICD-10-CM | POA: Diagnosis not present

## 2019-10-07 DIAGNOSIS — R911 Solitary pulmonary nodule: Secondary | ICD-10-CM

## 2019-10-07 NOTE — Progress Notes (Signed)
PCP is Wenda Low, MD Referring Provider is Lauraine Rinne, NP  Chief Complaint  Patient presents with  . Pleural Effusion    Follow-up w/ CXR, bilateral PleurX catheters    HPI: Patient returns for routine Pleurx catheter check with history of bilateral catheters.  There has been no significant change in his output.  The right Pleurx catheter is drained twice a week and drains 350-400 cc.  The left Pleurx catheter is draining 3 times per week and drains 700-750 cc.  Over the past week the left-sided catheter has been draining blood-tinged fluid.  The patient is taking Plavix.  He has noted no other sites of bleeding.  He will hold his Plavix for a week and then resume if the fluid clears.  He takes the Plavix because of a remote TIA.  The patient shortness of breath has been well controlled. He has been having problems with lower extremity edema and has been taking oral Demadex. Past Medical History:  Diagnosis Date  . Actinic keratoses   . Arthritis   . Chronic bilateral pleural effusions   . Chronic sinusitis   . Colon polyps   . Dyspnea 07/13/2013   BNP normal  . Edema 07/13/2013  . GERD (gastroesophageal reflux disease)    omeprazole 1-2 times daily  . H/O TIA (transient ischemic attack) and stroke    on statins and plavix -saw Dr.Sethi in the past  . Headache   . History of BPH   . History of TIA (transient ischemic attack) 07/13/2013  . Left inguinal hernia   . Left inguinal hernia 01/27/2019  . Lumbar radiculopathy   . Need for shingles vaccine   . Optic neuritis 10/2015   left eye  . Perennial allergic rhinitis   . Polymyalgia rheumatica ([place]) 07/13/2013   Daily prednisone  . Restless leg syndrome   . Retinal disease    right eye since birth   . Shingles   . Temporal arteritis ([place]) 10/2015  . Wears glasses   . 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 BRAVO [place] STUDY N/A 04/18/2016   Procedure: BRAVO [place];   Surgeon: Wilford Corner, MD;  [facility];  Service: Endoscopy;  Laterality: N/A;  . CHEST TUBE INSERTION Right 02/13/2018   Procedure: INSERTION PLEURAL DRAINAGE CATHETER;  Surgeon: Ivin Poot, MD;  Location: [facility];  Service: Thoracic;  Laterality: Right;  . COLONOSCOPY    . drropy eyelid surgery    . ESOPHAGOGASTRODUODENOSCOPY (EGD) WITH PROPOFOL N/A 04/18/2016   Procedure: ESOPHAGOGASTRODUODENOSCOPY (EGD) WITH PROPOFOL;  Surgeon: Wilford Corner, MD;  [facility];  Service: Endoscopy;  Laterality: N/A;  . INGUINAL HERNIA REPAIR Left 01/27/2019   Procedure: OPEN REPAIR LEFT INGUINAL HERNIA WITH MESH;  Surgeon: Fanny Skates, MD;  Location: [facility];  Service: General;  Laterality: Left;  GENERAL AND TAP BLOCK ANESTHESIA  . INSERTION OF MESH Left 01/27/2019   Procedure: Insertion Of Mesh;  Surgeon: Fanny Skates, MD;  Location: [facility];  Service: General;  Laterality: Left;  . IR THORACENTESIS ASP PLEURAL SPACE W/IMG GUIDE  02/04/2018  . IR THORACENTESIS ASP PLEURAL SPACE W/IMG GUIDE  02/06/2018  . RIGHT/LEFT HEART CATH AND CORONARY ANGIOGRAPHY N/A 06/24/2018   Procedure: RIGHT/LEFT HEART CATH AND CORONARY ANGIOGRAPHY;  Surgeon: Jolaine Artist, MD;  Location: [place] [facility];  Service: Cardiovascular;  Laterality: N/A;  . VASECTOMY      Family History  Problem Relation  Age of Onset  . Stroke Mother   . Coronary artery disease Mother   . Diabetes Mother   . Breast cancer Mother   . Heart attack Father   . Coronary artery disease Father   . Diabetes Father   . Graves' disease Sister     Social History Social History   Tobacco Use  . Smoking status: Former Smoker    Packs/day: 1.00    Years: 15.00    Pack years: 15.00    Types: Cigarettes    Quit date: 07/23/1975    Years since quitting: 44.2  . Smokeless tobacco: Never Used  Substance Use Topics  . Alcohol use: Yes    Alcohol/week: 7.0 standard drinks    Types: 7 Glasses of wine per week     Comment: 1 glass red wine daily  . Drug use: No    Current Outpatient Medications  Medication Sig Dispense Refill  . acetaminophen (TYLENOL) 500 MG tablet Take 500 mg by mouth daily as needed for moderate pain or headache.    . calcium carbonate (TUMS - DOSED IN MG ELEMENTAL CALCIUM) 500 MG chewable tablet Chew 1 tablet by mouth daily as needed for indigestion.    . cetirizine (ZYRTEC) 10 MG tablet Take 10 mg by mouth daily. As needed for allergies    . cholecalciferol (VITAMIN D3) 25 MCG (1000 UT) tablet Take 1,000 Units by mouth daily.    . clopidogrel (PLAVIX) 75 MG tablet Take 75 mg by mouth daily.     Marland Kitchen doxazosin (CARDURA) 8 MG tablet Take 8 mg by mouth daily.    . Omega-3 Fatty Acids (FISH OIL PO) Take 2 capsules by mouth daily.    . pantoprazole (PROTONIX) 20 MG tablet Take 20 mg by mouth daily.     Vladimir Faster Glycol-Propyl Glycol (SYSTANE OP) Place 1 drop into both eyes 2 (two) times daily as needed (dry eyes).    . potassium chloride SA (KLOR-CON) 20 MEQ tablet Take 3 tablets (60 mEq total) by mouth 2 (two) times daily. 540 tablet 3  . simvastatin (ZOCOR) 20 MG tablet Take 20 mg by mouth daily.    Marland Kitchen torsemide (DEMADEX) 20 MG tablet Take 3 tablets (60 mg total) by mouth every morning AND 2 tablets (40 mg total) every evening. 450 tablet 3  . vitamin C (ASCORBIC ACID) 250 MG tablet Take 250 mg by mouth daily.    Marland Kitchen octreotide (SANDOSTATIN) 50 MCG/ML SOLN injection Inject 1 syringe (50 mcg) twice daily (Patient not taking: Reported on 10/07/2019) 60 mL 3   No current facility-administered medications for this visit.    No Known Allergies  Review of Systems   Patient has received his Covid 19 vaccination x2 No fever Some difficulty with the connection of the left Pleurx catheter to the calf but the engagement has remained sealed and the catheter is functioning. Positive for ankle edema Negative for dizziness or presyncope No symptoms of COVID-19, no shortness of breath productive  cough chest pain fever No abdominal pain or change in bowel habits  BP 119/62 (BP Location: Right Arm, Patient Position: Sitting, Cuff Size: Normal)   Pulse 86   Temp 97.9 F (36.6 C) (Temporal)   Resp 20   Ht 5' 5.5" (1.664 m)   Wt 146 lb (66.2 kg)   SpO2 97% Comment: RA  BMI 23.93 kg/m  Physical Exam      Exam    General- alert and comfortable    Neck- no JVD,  no cervical adenopathy palpable, no carotid bruit   Lungs- clear without rales, wheezes.  Left Pleurx catheter personally reviewed and examined.  The plastic cap will not click but does provide a firm seal when twisted to initial point of resistance.   Cor- regular rate and rhythm, no murmur , gallop   Abdomen- soft, non-tender   Extremities - warm, non-tender, minimal edema   Neuro- oriented, appropriate, no focal weakness   Diagnostic Tests: Chest x-ray today personally viewed showing catheter is in good position with minimal pleural effusions  Impression: Continue current catheter management Return with chest x-ray in 2 months Plan: The patient will call us if the blood-tinged fluid from the left side does not clear with stopping Plavix for 1 week.  If it still is bloody the patient will be instructed to hold Plavix until the fluid is cleared.  Len Childs, MD [facility] [phone]

## 2019-10-15 DIAGNOSIS — J918 Pleural effusion in other conditions classified elsewhere: Secondary | ICD-10-CM | POA: Diagnosis not present

## 2019-10-16 ENCOUNTER — Telehealth: Payer: Self-pay

## 2019-10-16 NOTE — Telephone Encounter (Addendum)
Pt called office yesterday to give an update regarding the color and amount of PleurX drainage per Dr. Lucianne Lei Trigt's request at last [place] on 10/07/19. Pt has bilateral PleurX catheters in place. Pt reported that his L PleurX catheter drainage was still bloody after stopping Plavix for one week. He reported that "it may be a little lighter, but not much, and nothing like the color from the other side." He stated he drained 600 ml of fluid from the L side on 3/25 and 550 ml on 3/23. Pt reported that he had already resumed Plavix on the morning of 3/25 (first dose since being instructed to hold it a week prior). Advised him not to take another dose until Dr. [name] was made aware of the above information. Left Dr. [name] a message informing him of this situation. Advised today by Dr. [name] that pt should hold Plavix for another week and call to give an update regarding color and amount of PleurX drainage. TC to pt this morning to instruct him to hold Plavix for another week; advised to call [facility] on Thursday 10/22/19 after draining his PleurX that morning to give an update regarding the characteristics of the drainage. He verbalizes understanding. Advised to call [facility] for problems or concerns.

## 2019-10-22 ENCOUNTER — Encounter: Payer: Self-pay | Admitting: Cardiothoracic Surgery

## 2019-10-22 ENCOUNTER — Telehealth: Payer: Self-pay

## 2019-10-22 NOTE — Telephone Encounter (Signed)
Pt called office this morning to give an update regarding his L PleurX drainage. He also sent a photo on MyChart. He reports that there has been no significant change in the color or amount of drainage since Plavix was put on hold. Discussed w/ Dr. [name]; orders for pt to resume taking Plavix only on Mondays, Wednesdays, and Fridays and to report any changes in the color or amount of PleurX drainage. Left pt a message on MyChart and spoke with him on the phone to notify of the above orders by Dr. [name]; pt verbalizes understanding.

## 2019-11-03 DIAGNOSIS — C44329 Squamous cell carcinoma of skin of other parts of face: Secondary | ICD-10-CM | POA: Diagnosis not present

## 2019-11-03 DIAGNOSIS — D485 Neoplasm of uncertain behavior of skin: Secondary | ICD-10-CM | POA: Diagnosis not present

## 2019-11-03 DIAGNOSIS — Z85828 Personal history of other malignant neoplasm of skin: Secondary | ICD-10-CM | POA: Diagnosis not present

## 2019-11-10 ENCOUNTER — Encounter: Payer: Self-pay | Admitting: Cardiothoracic Surgery

## 2019-11-10 ENCOUNTER — Ambulatory Visit (INDEPENDENT_AMBULATORY_CARE_PROVIDER_SITE_OTHER): Payer: Medicare Other | Admitting: Cardiothoracic Surgery

## 2019-11-10 ENCOUNTER — Ambulatory Visit
Admission: RE | Admit: 2019-11-10 | Discharge: 2019-11-10 | Disposition: A | Discharge status: Home / Self Care | Payer: Medicare Other | Source: Ambulatory Visit | Attending: Cardiothoracic Surgery | Admitting: Cardiothoracic Surgery

## 2019-11-10 ENCOUNTER — Telehealth: Payer: Self-pay

## 2019-11-10 ENCOUNTER — Other Ambulatory Visit: Payer: Self-pay

## 2019-11-10 VITALS — BP 117/62 | HR 83 | Temp 98.2°F | Resp 16 | Ht 65.5 in | Wt 144.8 lb

## 2019-11-10 DIAGNOSIS — J90 Pleural effusion, not elsewhere classified: Secondary | ICD-10-CM

## 2019-11-10 DIAGNOSIS — I509 Heart failure, unspecified: Secondary | ICD-10-CM | POA: Diagnosis not present

## 2019-11-10 DIAGNOSIS — I2584 Coronary atherosclerosis due to calcified coronary lesion: Secondary | ICD-10-CM | POA: Diagnosis not present

## 2019-11-10 DIAGNOSIS — I2510 Atherosclerotic heart disease of native coronary artery without angina pectoris: Secondary | ICD-10-CM

## 2019-11-10 DIAGNOSIS — R0602 Shortness of breath: Secondary | ICD-10-CM | POA: Diagnosis not present

## 2019-11-10 DIAGNOSIS — Z9689 Presence of other specified functional implants: Secondary | ICD-10-CM | POA: Diagnosis not present

## 2019-11-10 NOTE — Progress Notes (Signed)
PCP is Wenda Low, MD Referring Provider is Wenda Low, MD  Chief Complaint  Patient presents with  . Routine Post Op    assess LEFT pleurX catheter for ease of drainage.Marland KitchenMarland Kitchen? flush...right .Marland KitchenMarland Kitchen700cc/week two times per week..  left 1300-1600cc/week three times per week    HPI: The patient returns for left Pleurx catheter check.  The catheter usually drains over 300 cc per drainage session.  Recently the drainage has been bloody probably related to his Plavix.  He has developed some fibrin strands in the Pleurx catheter.  We will stop his Plavix and transition to aspirin 81 mg daily.  Today in the clinic I used sterile saline to flush the Pleurx catheter and then aspirated approximately 50 cc of fluid.  Chest x-ray taken today shows a loculated effusion along the left lateral chest wall which is small.  After draining all that I could the the catheter was sterilely capped and a sterile dressing applied. The patient will continue his drainage schedule. The pleural bleeding could have formed adhesions that would impair the function of the catheter.  Adhesions also can reduce the output of fluid into the pleural space.  We will observe what happens over the next several days.  If drainage continues to be reduced but he has no symptoms we will check an x-ray and if there is no significant change the catheter can be removed.  If the drainage volume remains small but the patient developed symptoms then will he will need a chest x-ray to confirm reaccumulation and then be scheduled for new catheter placement and removal of the old one.   Past Medical History:  Diagnosis Date  . Actinic keratoses   . Arthritis   . Chronic bilateral pleural effusions   . Chronic sinusitis   . Colon polyps   . Dyspnea 07/13/2013   BNP normal  . Edema 07/13/2013  . GERD (gastroesophageal reflux disease)    omeprazole 1-2 times daily  . H/O TIA (transient ischemic attack) and stroke    on statins and  plavix -saw Dr.Sethi in the past  . Headache   . History of BPH   . History of TIA (transient ischemic attack) 07/13/2013  . Left inguinal hernia   . Left inguinal hernia 01/27/2019  . Lumbar radiculopathy   . Need for shingles vaccine   . Optic neuritis 10/2015   left eye  . Perennial allergic rhinitis   . Polymyalgia rheumatica ([place]) 07/13/2013   Daily prednisone  . Restless leg syndrome   . Retinal disease    right eye since birth   . Shingles   . Temporal arteritis ([place]) 10/2015  . Wears glasses   . 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 BRAVO [place] STUDY N/A 04/18/2016   Procedure: BRAVO [place];  Surgeon: Wilford Corner, MD;  Location: [facility];  Service: Endoscopy;  Laterality: N/A;  . CHEST TUBE INSERTION Right 02/13/2018   Procedure: INSERTION PLEURAL DRAINAGE CATHETER;  Surgeon: Ivin Poot, MD;  Location: [facility];  Service: Thoracic;  Laterality: Right;  . COLONOSCOPY    . drropy eyelid surgery    . ESOPHAGOGASTRODUODENOSCOPY (EGD) WITH PROPOFOL N/A 04/18/2016   Procedure: ESOPHAGOGASTRODUODENOSCOPY (EGD) WITH PROPOFOL;  Surgeon: Wilford Corner, MD;  [facility];  Service: Endoscopy;  Laterality: N/A;  . INGUINAL HERNIA REPAIR Left 01/27/2019   Procedure: OPEN REPAIR LEFT INGUINAL HERNIA WITH MESH;  Surgeon: Fanny Skates, MD;  Location: [place] [facility];  Service: General;  Laterality: Left;  GENERAL AND TAP BLOCK ANESTHESIA  . INSERTION OF MESH Left 01/27/2019   Procedure: Insertion Of Mesh;  Surgeon: Fanny Skates, MD;  Location: [facility];  Service: General;  Laterality: Left;  . IR THORACENTESIS ASP PLEURAL SPACE W/IMG GUIDE  02/04/2018  . IR THORACENTESIS ASP PLEURAL SPACE W/IMG GUIDE  02/06/2018  . RIGHT/LEFT HEART CATH AND CORONARY ANGIOGRAPHY N/A 06/24/2018   Procedure: RIGHT/LEFT HEART CATH AND CORONARY ANGIOGRAPHY;  Surgeon: Jolaine Artist, MD;  Location: [facility];  Service: Cardiovascular;   Laterality: N/A;  . VASECTOMY      Family History  Problem Relation Age of Onset  . Stroke Mother   . Coronary artery disease Mother   . Diabetes Mother   . Breast cancer Mother   . Heart attack Father   . Coronary artery disease Father   . Diabetes Father   . Graves' disease Sister     Social History Social History   Tobacco Use  . Smoking status: Former Smoker    Packs/day: 1.00    Years: 15.00    Pack years: 15.00    Types: Cigarettes    Quit date: 07/23/1975    Years since quitting: 44.3  . Smokeless tobacco: Never Used  Substance Use Topics  . Alcohol use: Yes    Alcohol/week: 7.0 standard drinks    Types: 7 Glasses of wine per week    Comment: 1 glass red wine daily  . Drug use: No    Current Outpatient Medications  Medication Sig Dispense Refill  . acetaminophen (TYLENOL) 500 MG tablet Take 500 mg by mouth daily as needed for moderate pain or headache.    . calcium carbonate (TUMS - DOSED IN MG ELEMENTAL CALCIUM) 500 MG chewable tablet Chew 1 tablet by mouth daily as needed for indigestion.    . cetirizine (ZYRTEC) 10 MG tablet Take 10 mg by mouth daily. As needed for allergies    . cholecalciferol (VITAMIN D3) 25 MCG (1000 UT) tablet Take 1,000 Units by mouth daily.    . clopidogrel (PLAVIX) 75 MG tablet Take 75 mg by mouth daily.     Marland Kitchen doxazosin (CARDURA) 8 MG tablet Take 8 mg by mouth daily.    Marland Kitchen octreotide (SANDOSTATIN) 50 MCG/ML SOLN injection Inject 1 syringe (50 mcg) twice daily (Patient not taking: Reported on 10/07/2019) 60 mL 3  . Omega-3 Fatty Acids (FISH OIL PO) Take 2 capsules by mouth daily.    . pantoprazole (PROTONIX) 20 MG tablet Take 20 mg by mouth daily.     Vladimir Faster Glycol-Propyl Glycol (SYSTANE OP) Place 1 drop into both eyes 2 (two) times daily as needed (dry eyes).    . potassium chloride SA (KLOR-CON) 20 MEQ tablet Take 3 tablets (60 mEq total) by mouth 2 (two) times daily. 540 tablet 3  . simvastatin (ZOCOR) 20 MG tablet Take 20 mg by  mouth daily.    Marland Kitchen torsemide (DEMADEX) 20 MG tablet Take 3 tablets (60 mg total) by mouth every morning AND 2 tablets (40 mg total) every evening. 450 tablet 3  . vitamin C (ASCORBIC ACID) 250 MG tablet Take 250 mg by mouth daily.     No current facility-administered medications for this visit.    No Known Allergies  Review of Systems  No fever No shortness of breath  BP 117/62 (BP Location: Left Arm, Patient Position: Sitting, Cuff Size: Normal)   Pulse 83   Temp 98.2 F (36.8  C)   Resp 16   Ht 5' 5.5" (1.664 m)   Wt 144 lb 12.8 oz (65.7 kg)   SpO2 97% Comment: ON RA  BMI 23.73 kg/m  Physical Exam Breath sounds equal bilaterally Pleurx catheter site personally examined and is clean and dry Pleurx catheter sterile dressing reapplied in my presence  Diagnostic Tests: Chest x-ray shows small loculated fluid collection along the left lateral pleural space  Impression: Reduced output from left Pleurx catheter.  Issues as noted above. We will continue to observe the drainage and then proceed with plan as noted above. Plan: Patient  to  return at his previously scheduled visit but if he develops symptoms he will call and we will get him into be seen earlier with chest x-ray.  Stop Plavix and start aspirin 81 mg daily  Tharon Aquas Adelene Idler, MD [facility] [phone]

## 2019-11-10 NOTE — Telephone Encounter (Addendum)
Pt called the office to report that L PleurX appears to be clogged. He tried to drain this morning and noticed a blood clot in the bottle, then approximately one tablespoon of bloody drainage was obtained. He attempted to drain again w/ new bottle and noted only 1/2 tablespoon of bloody drainage. Pt states he typically has at least 500 ml of "cranberry juice"-colored drainage from L PleurX, and this amount was obtained on 11/07/19. He reports that his L PleurX tubing is currently filled w/ bloody drainage. Discussed w/ Dr. [name]. New orders for CXR and OV today at 1:30.

## 2019-11-11 DIAGNOSIS — J918 Pleural effusion in other conditions classified elsewhere: Secondary | ICD-10-CM | POA: Diagnosis not present

## 2019-11-16 ENCOUNTER — Encounter: Payer: Self-pay | Admitting: Cardiothoracic Surgery

## 2019-11-19 ENCOUNTER — Encounter: Payer: Self-pay | Admitting: Cardiothoracic Surgery

## 2019-11-23 ENCOUNTER — Ambulatory Visit: Payer: Medicare Other | Admitting: Cardiothoracic Surgery

## 2019-11-23 ENCOUNTER — Other Ambulatory Visit: Payer: Self-pay | Admitting: Cardiothoracic Surgery

## 2019-11-23 ENCOUNTER — Telehealth: Payer: Self-pay

## 2019-11-23 DIAGNOSIS — R911 Solitary pulmonary nodule: Secondary | ICD-10-CM

## 2019-11-24 ENCOUNTER — Other Ambulatory Visit: Payer: Self-pay

## 2019-11-24 ENCOUNTER — Ambulatory Visit
Admission: RE | Admit: 2019-11-24 | Discharge: 2019-11-24 | Disposition: A | Discharge status: Home / Self Care | Payer: Medicare Other | Source: Ambulatory Visit | Attending: Thoracic Surgery (Cardiothoracic Vascular Surgery) | Admitting: Thoracic Surgery (Cardiothoracic Vascular Surgery)

## 2019-11-24 ENCOUNTER — Encounter: Payer: Self-pay | Admitting: Cardiothoracic Surgery

## 2019-11-24 ENCOUNTER — Ambulatory Visit (INDEPENDENT_AMBULATORY_CARE_PROVIDER_SITE_OTHER): Payer: Medicare Other | Admitting: Cardiothoracic Surgery

## 2019-11-24 ENCOUNTER — Other Ambulatory Visit: Payer: Self-pay | Admitting: Cardiothoracic Surgery

## 2019-11-24 VITALS — BP 132/70 | HR 87 | Temp 98.1°F | Resp 20 | Ht 65.5 in | Wt 150.0 lb

## 2019-11-24 DIAGNOSIS — I509 Heart failure, unspecified: Secondary | ICD-10-CM | POA: Diagnosis not present

## 2019-11-24 DIAGNOSIS — R0602 Shortness of breath: Secondary | ICD-10-CM | POA: Diagnosis not present

## 2019-11-24 DIAGNOSIS — I2584 Coronary atherosclerosis due to calcified coronary lesion: Secondary | ICD-10-CM | POA: Diagnosis not present

## 2019-11-24 DIAGNOSIS — J90 Pleural effusion, not elsewhere classified: Secondary | ICD-10-CM | POA: Diagnosis not present

## 2019-11-24 DIAGNOSIS — Z4682 Encounter for fitting and adjustment of non-vascular catheter: Secondary | ICD-10-CM | POA: Diagnosis not present

## 2019-11-24 DIAGNOSIS — I2510 Atherosclerotic heart disease of native coronary artery without angina pectoris: Secondary | ICD-10-CM

## 2019-11-24 DIAGNOSIS — R911 Solitary pulmonary nodule: Secondary | ICD-10-CM

## 2019-11-24 NOTE — Progress Notes (Signed)
PCP is Wenda Low, MD Referring Provider is Wenda Low, MD  Chief Complaint  Patient presents with  . Pleural Effusion    CXR, c/o increased shortness of breath, bi-lat PleurX in place    HPI: The patient presents with complaints of increasing shortness of breath with activity. He has had bilateral Pleurx catheters placed almost 2 years ago in the left catheter has stopped draining.  This was preceded by some bleeding and probable fibrin stranding in the Pleurx catheter. Chest x-ray to performed today shows complete opacification of the left hemithorax consistent with large pleural effusion. The Pleurx catheter has been flushed with sterile saline but still cannot drain. Patient will need a new Pleurx catheter.  I discussed with him performing a office thoracentesis today to help improve his symptoms until the Pleurx catheter can be placed. A left thoracentesis was performed under informed consent with sterile technique and local 1% lidocaine and proper timeout. Approximately 50 cc of clear yellow fluid was removed but then the drainage stopped.  This is consistent with loculated left pleural effusion. For this reason the best approach to getting a functional new Pleurx catheter on left side is CT-guided Pleurx catheter placed by interventional radiology.  This will be set up as outpatient. On the day that the CT-guided left Pleurx catheter by IR was placed the patient will have the current nonfunctional Pleurx catheter removed in the short stay area/outpatient facility of the hospital I have discussed this plan with the patient and he understands.  I feel that it is safe to form this as an outpatient with the patient's O2 saturation 95% on room air and his right lung is clear.  Past Medical History:  Diagnosis Date  . Actinic keratoses   . Arthritis   . Chronic bilateral pleural effusions   . Chronic sinusitis   . Colon polyps   . Dyspnea 07/13/2013   BNP normal  . Edema  07/13/2013  . GERD (gastroesophageal reflux disease)    omeprazole 1-2 times daily  . H/O TIA (transient ischemic attack) and stroke    on statins and plavix -saw Dr.Sethi in the past  . Headache   . History of BPH   . History of TIA (transient ischemic attack) 07/13/2013  . Left inguinal hernia   . Left inguinal hernia 01/27/2019  . Lumbar radiculopathy   . Need for shingles vaccine   . Optic neuritis 10/2015   left eye  . Perennial allergic rhinitis   . Polymyalgia rheumatica ([place]) 07/13/2013   Daily prednisone  . Restless leg syndrome   . Retinal disease    right eye since birth   . Shingles   . Temporal arteritis ([place]) 10/2015  . Wears glasses   . 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 BRAVO [place] STUDY N/A 04/18/2016   Procedure: BRAVO [place];  Surgeon: Wilford Corner, MD;  [facility];  Service: Endoscopy;  Laterality: N/A;  . CHEST TUBE INSERTION Right 02/13/2018   Procedure: INSERTION PLEURAL DRAINAGE CATHETER;  Surgeon: Ivin Poot, MD;  Location: [place];  Service: Thoracic;  Laterality: Right;  . COLONOSCOPY    . drropy eyelid surgery    . ESOPHAGOGASTRODUODENOSCOPY (EGD) WITH PROPOFOL N/A 04/18/2016   Procedure: ESOPHAGOGASTRODUODENOSCOPY (EGD) WITH PROPOFOL;  Surgeon: Wilford Corner, MD;  [facility];  Service: Endoscopy;  Laterality: N/A;  . INGUINAL HERNIA REPAIR Left 01/27/2019   Procedure: OPEN REPAIR LEFT INGUINAL HERNIA WITH  MESH;  Surgeon: Fanny Skates, MD;  Location: [employer];  Service: General;  Laterality: Left;  GENERAL AND TAP BLOCK ANESTHESIA  . INSERTION OF MESH Left 01/27/2019   Procedure: Insertion Of Mesh;  Surgeon: Fanny Skates, MD;  Location: [place];  Service: General;  Laterality: Left;  . IR THORACENTESIS ASP PLEURAL SPACE W/IMG GUIDE  02/04/2018  . IR THORACENTESIS ASP PLEURAL SPACE W/IMG GUIDE  02/06/2018  . RIGHT/LEFT HEART CATH AND CORONARY ANGIOGRAPHY N/A 06/24/2018    Procedure: RIGHT/LEFT HEART CATH AND CORONARY ANGIOGRAPHY;  Surgeon: Jolaine Artist, MD;  Location: [facility];  Service: Cardiovascular;  Laterality: N/A;  . VASECTOMY      Family History  Problem Relation Age of Onset  . Stroke Mother   . Coronary artery disease Mother   . Diabetes Mother   . Breast cancer Mother   . Heart attack Father   . Coronary artery disease Father   . Diabetes Father   . Graves' disease Sister     Social History Social History   Tobacco Use  . Smoking status: Former Smoker    Packs/day: 1.00    Years: 15.00    Pack years: 15.00    Types: Cigarettes    Quit date: 07/23/1975    Years since quitting: 44.3  . Smokeless tobacco: Never Used  Substance Use Topics  . Alcohol use: Yes    Alcohol/week: 7.0 standard drinks    Types: 7 Glasses of wine per week    Comment: 1 glass red wine daily  . Drug use: No    Current Outpatient Medications  Medication Sig Dispense Refill  . acetaminophen (TYLENOL) 500 MG tablet Take 500 mg by mouth daily as needed for moderate pain or headache.    . calcium carbonate (TUMS - DOSED IN MG ELEMENTAL CALCIUM) 500 MG chewable tablet Chew 1 tablet by mouth daily as needed for indigestion.    . cetirizine (ZYRTEC) 10 MG tablet Take 10 mg by mouth daily. As needed for allergies    . doxazosin (CARDURA) 8 MG tablet Take 8 mg by mouth daily.    . Omega-3 Fatty Acids (FISH OIL PO) Take 2 capsules by mouth daily.    . pantoprazole (PROTONIX) 20 MG tablet Take 20 mg by mouth daily.     Vladimir Faster Glycol-Propyl Glycol (SYSTANE OP) Place 1 drop into both eyes 2 (two) times daily as needed (dry eyes).    . potassium chloride SA (KLOR-CON) 20 MEQ tablet Take 3 tablets (60 mEq total) by mouth 2 (two) times daily. 540 tablet 3  . simvastatin (ZOCOR) 20 MG tablet Take 20 mg by mouth daily.    Marland Kitchen torsemide (DEMADEX) 20 MG tablet Take 3 tablets (60 mg total) by mouth every morning AND 2 tablets (40 mg total) every evening. 450  tablet 3  . vitamin C (ASCORBIC ACID) 250 MG tablet Take 250 mg by mouth daily.     No current facility-administered medications for this visit.    No Known Allergies  Review of Systems  Weight is stable Lower extremity edema is stable Shortness of breath with almost any activity No fever or productive cough No syncope or falls No chest pain or palpitations No new abdominal pain or swelling  BP 132/70   Pulse 87   Temp 98.1 F (36.7 C) (Skin)   Resp 20   Ht 5' 5.5" (1.664 m)   Wt 150 lb (68 kg)   SpO2 95% Comment: RA  BMI  24.58 kg/m  Physical Exam Alert and oriented and appropriate breathing comfortably at rest Minimal JVD Diminished breath sounds on the left side clear on the right Heart rhythm regular without murmur Abdomen without distention or tenderness 1-2+ bilateral ankle edema No focal neuro deficit  Diagnostic Tests: Chest x-ray performed prior to his exam shows a large left left pleural effusion with opacification of the left hemithorax  Impression: Patient has been battling recurrent bilateral pleural effusions or over 2 years.  He continues to take torsemide.  He will need a new left Pleurx catheter--I feel the best way to place this is through CT-guided imaging by interventional radiology since the pleural effusion is probably loculated.  This will be set up as an outpatient and then the patient will return for further follow-up of his catheters as he has been for the past 2 years.  Plan: Return in approximately 2 weeks after Pleurx catheter exchange.  I will leave the current Pleurx catheter in place in case IR wishes to use that to wire a new catheter or to try lytic therapy.  The patient will continue to hold his Plavix which he has been off for almost a month.  He will continue his other meds including aspirin 81 mg   Len Childs, MD [facility] [phone]

## 2019-11-25 ENCOUNTER — Encounter: Payer: Self-pay | Admitting: Cardiothoracic Surgery

## 2019-11-26 ENCOUNTER — Other Ambulatory Visit: Payer: Self-pay | Admitting: Cardiothoracic Surgery

## 2019-11-26 ENCOUNTER — Other Ambulatory Visit: Payer: Self-pay

## 2019-11-26 ENCOUNTER — Encounter: Payer: Self-pay | Admitting: Cardiothoracic Surgery

## 2019-11-26 ENCOUNTER — Encounter (HOSPITAL_COMMUNITY): Payer: Self-pay

## 2019-11-26 ENCOUNTER — Other Ambulatory Visit: Payer: Self-pay | Admitting: *Deleted

## 2019-11-26 ENCOUNTER — Ambulatory Visit (INDEPENDENT_AMBULATORY_CARE_PROVIDER_SITE_OTHER): Payer: Medicare Other | Admitting: Cardiothoracic Surgery

## 2019-11-26 ENCOUNTER — Ambulatory Visit
Admission: RE | Admit: 2019-11-26 | Discharge: 2019-11-26 | Disposition: A | Discharge status: Home / Self Care | Payer: Medicare Other | Source: Ambulatory Visit | Attending: Cardiothoracic Surgery | Admitting: Cardiothoracic Surgery

## 2019-11-26 ENCOUNTER — Telehealth (HOSPITAL_COMMUNITY): Payer: Self-pay | Admitting: Radiology

## 2019-11-26 ENCOUNTER — Other Ambulatory Visit (HOSPITAL_COMMUNITY)
Admission: RE | Admit: 2019-11-26 | Discharge: 2019-11-26 | Disposition: A | Discharge status: Home / Self Care | Payer: Medicare Other | Source: Ambulatory Visit | Attending: Cardiothoracic Surgery | Admitting: Cardiothoracic Surgery

## 2019-11-26 VITALS — BP 119/72 | HR 92 | Temp 97.9°F | Resp 20 | Ht 65.5 in | Wt 150.6 lb

## 2019-11-26 DIAGNOSIS — J90 Pleural effusion, not elsewhere classified: Secondary | ICD-10-CM

## 2019-11-26 DIAGNOSIS — I2584 Coronary atherosclerosis due to calcified coronary lesion: Secondary | ICD-10-CM

## 2019-11-26 DIAGNOSIS — I2510 Atherosclerotic heart disease of native coronary artery without angina pectoris: Secondary | ICD-10-CM

## 2019-11-26 DIAGNOSIS — Z20822 Contact with and (suspected) exposure to covid-19: Secondary | ICD-10-CM | POA: Insufficient documentation

## 2019-11-26 DIAGNOSIS — Z01812 Encounter for preprocedural laboratory examination: Secondary | ICD-10-CM | POA: Insufficient documentation

## 2019-11-26 DIAGNOSIS — J9819 Other pulmonary collapse: Secondary | ICD-10-CM

## 2019-11-26 DIAGNOSIS — Z4682 Encounter for fitting and adjustment of non-vascular catheter: Secondary | ICD-10-CM | POA: Diagnosis not present

## 2019-11-26 LAB — SARS CORONAVIRUS 2 (TAT 6-24 HRS): SARS Coronavirus 2: NEGATIVE

## 2019-11-26 MED ORDER — IOPAMIDOL (ISOVUE-300) INJECTION 61%
75.0000 mL | Freq: Once | INTRAVENOUS | Status: AC | PRN
  Administered 2019-11-26: 75 mL via INTRAVENOUS

## 2019-11-26 NOTE — Progress Notes (Signed)
PCP is Wenda Low, MD Referring Provider is Wenda Low, MD  Chief Complaint  Patient presents with  . 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  The patient also has a right pleural catheter placed at the same time which is functioning well.  Over the past 3 weeks the output from the left Pleurx catheter has decreased then stopped.  This was preceded by some bleeding in the right pleural space related to his Plavix.  The catheter was flushed several times with fibrin strands but never reestablished adequate drainage.  2 weeks ago his chest x-ray showed minimal lateral accumulation of pleural fluid.  2 days ago a follow-up x-ray showed white out of the left hemithorax and a CT scan of the chest performed today shows complete compression of his left lung with large left pleural effusion.  The left Pleurx catheter remains in position.  The right pleural space is well drained by the right Pleurx catheter.  Patient has dyspnea on exertion with the large left pleural effusion.  He denies orthopnea or PND.  His room air saturation remains 96% but he is limited by shortness of breath in his ambulation and normal activities of daily living.  The patient is being prepared for surgery to place a new left Pleurx catheter and remove the dysfunctional initial l catheter placed 2 years ago. Has been vaccinated and is Covid negative. Patient denies fever or productive cough chest pain The patient has mild chronic ankle edema associated with his yellow nail syndrome treated with oral diuretics Patient's last echocardiogram was 2019 showing EF of 55%. The patient takes Plavix chronically for TIA several years ago.  He has been off of the past few weeks.   Past  Medical History:  Diagnosis Date  . Actinic keratoses   . Arthritis   . Chronic bilateral pleural effusions   . Chronic sinusitis   . Colon polyps   . Dyspnea 07/13/2013   BNP normal  . Edema 07/13/2013  . GERD (gastroesophageal reflux disease)    omeprazole 1-2 times daily  . H/O TIA (transient ischemic attack) and stroke    on statins and plavix -saw Dr.Sethi in the past  . Headache   . History of BPH   . History of TIA (transient ischemic attack) 07/13/2013  . Left inguinal hernia   . Left inguinal hernia 01/27/2019  . Lumbar radiculopathy   . Need for shingles vaccine   . Optic neuritis 10/2015   left eye  . Perennial allergic rhinitis   . Polymyalgia rheumatica ([place]) 07/13/2013   Daily prednisone  . Restless leg syndrome   . Retinal disease    right eye since birth   . Shingles   . Temporal arteritis ([place]) 10/2015  . Wears glasses   . 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 BRAVO [place] STUDY N/A 04/18/2016   Procedure: BRAVO [place];  Surgeon: Wilford Corner, MD;  [facility];  Service: Endoscopy;  Laterality: N/A;  . CHEST TUBE INSERTION Right 02/13/2018   Procedure: INSERTION PLEURAL DRAINAGE CATHETER;  Surgeon: Ivin Poot, MD;  Location: [facility];  Service: Thoracic;  Laterality: Right;  . COLONOSCOPY    . drropy eyelid surgery    .  ESOPHAGOGASTRODUODENOSCOPY (EGD) WITH PROPOFOL N/A 04/18/2016   Procedure: ESOPHAGOGASTRODUODENOSCOPY (EGD) WITH PROPOFOL;  Surgeon: Wilford Corner, MD;  [facility];  Service: Endoscopy;  Laterality: N/A;  . INGUINAL HERNIA REPAIR Left 01/27/2019   Procedure: OPEN REPAIR LEFT INGUINAL HERNIA WITH MESH;  Surgeon: Fanny Skates, MD;  Location: [facility];  Service: General;  Laterality: Left;  GENERAL AND TAP BLOCK ANESTHESIA  . INSERTION OF MESH Left 01/27/2019   Procedure: Insertion Of Mesh;  Surgeon: Fanny Skates, MD;  Location: [facility];  Service: General;  Laterality:  Left;  . IR THORACENTESIS ASP PLEURAL SPACE W/IMG GUIDE  02/04/2018  . IR THORACENTESIS ASP PLEURAL SPACE W/IMG GUIDE  02/06/2018  . RIGHT/LEFT HEART CATH AND CORONARY ANGIOGRAPHY N/A 06/24/2018   Procedure: RIGHT/LEFT HEART CATH AND CORONARY ANGIOGRAPHY;  Surgeon: Jolaine Artist, MD;  Location: [facility];  Service: Cardiovascular;  Laterality: N/A;  . VASECTOMY      Family History  Problem Relation Age of Onset  . Stroke Mother   . Coronary artery disease Mother   . Diabetes Mother   . Breast cancer Mother   . Heart attack Father   . Coronary artery disease Father   . Diabetes Father   . Graves' disease Sister     Social History Social History   Tobacco Use  . Smoking status: Former Smoker    Packs/day: 1.00    Years: 15.00    Pack years: 15.00    Types: Cigarettes    Quit date: 07/23/1975    Years since quitting: 44.3  . Smokeless tobacco: Never Used  Substance Use Topics  . Alcohol use: Yes    Alcohol/week: 7.0 standard drinks    Types: 7 Glasses of wine per week    Comment: 1 glass red wine daily  . Drug use: No    Current Outpatient Medications  Medication Sig Dispense Refill  . acetaminophen (TYLENOL) 500 MG tablet Take 500 mg by mouth daily as needed for moderate pain or headache.    . calcium carbonate (TUMS - DOSED IN MG ELEMENTAL CALCIUM) 500 MG chewable tablet Chew 1 tablet by mouth daily as needed for indigestion.    . cetirizine (ZYRTEC) 10 MG tablet Take 10 mg by mouth daily. As needed for allergies    . doxazosin (CARDURA) 8 MG tablet Take 8 mg by mouth daily.    . Omega-3 Fatty Acids (FISH OIL PO) Take 2 capsules by mouth daily.    . pantoprazole (PROTONIX) 20 MG tablet Take 20 mg by mouth daily.     Vladimir Faster Glycol-Propyl Glycol (SYSTANE OP) Place 1 drop into both eyes 2 (two) times daily as needed (dry eyes).    . potassium chloride SA (KLOR-CON) 20 MEQ tablet Take 3 tablets (60 mEq total) by mouth 2 (two) times daily. 540 tablet 3  .  simvastatin (ZOCOR) 20 MG tablet Take 20 mg by mouth daily.    Marland Kitchen torsemide (DEMADEX) 20 MG tablet Take 3 tablets (60 mg total) by mouth every morning AND 2 tablets (40 mg total) every evening. 450 tablet 3  . vitamin C (ASCORBIC ACID) 250 MG tablet Take 250 mg by mouth daily.     No current facility-administered medications for this visit.    No Known Allergies  Review of Systems                     Review of Systems :  [ y ] = yes, [  ] =  no        General :  Weight gain [   ]    Weight loss  [   ]  Fatigue [ y ]  Fever [  ]  Chills  [  ]                                          HEENT    Headache [  ]  Dizziness [  ]  Blurred vision [  ] Glaucoma  [  ]                          Nosebleeds [  ] Painful or loose teeth [  ]        Cardiac :  Chest pain/ pressure [  ]  Resting SOB [  ] exertional SOB [  ]                        Orthopnea [  ]  Pedal edema  [  ]  Palpitations [  ] Syncope/presyncope [ ]                         Paroxysmal nocturnal dyspnea [  ]         Pulmonary : cough [  ]  wheezing [  ]  Hemoptysis [  ] Sputum [  ] Snoring [  ]                              Pneumothorax [  ]  Sleep apnea [  ] shortness of breath        GI : Vomiting [  ]  Dysphagia [  ]  Melena  [  ]  Abdominal pain [  ] BRBPR [  ]              Heart burn [  ]  Constipation [  ] Diarrhea  [  ] Colonoscopy [   ]        GU : Hematuria [  ]  Dysuria [  ]  Nocturia [  ] UTI's [  ]        Vascular : Claudication [  ]  Rest pain [  ]  DVT [  ] Vein stripping [  ] leg ulcers [  ]                          TIA [  ] Stroke [  ]  Varicose veins [  ]        NEURO :  Headaches  [  ] Seizures [  ] Vision changes [  ] Paresthesias [  ]                                               Musculoskeletal :  Arthritis [  ] Gout  [  ]  Back pain [  ]  Joint pain [  ]        Skin :  Rash [  ]  Melanoma [  ]  Sores [  ]        Heme : Bleeding problems [  ]Clotting Disorders [  ] Anemia [  ]Blood Transfusion [ ]          Endocrine : Diabetes [  ] Heat or Cold intolerance [  ] Polyuria [  ]excessive thirst [ ]         Psych : Depression [  ]  Anxiety [  ]  Psych hospitalizations [  ] Memory change [  ]                                                                            BP 119/72   Pulse 92   Temp 97.9 F (36.6 C) (Skin)   Resp 20   Ht 5' 5.5" (1.664 m)   Wt 150 lb 9.6 oz (68.3 kg)   SpO2 96% Comment: RA  BMI 24.68 kg/m  Physical Exam       Exam    General- alert and comfortable    Neck- no JVD, no cervical adenopathy palpable, no carotid bruit   Lungs- clear without rales, wheezes with diminished breath sounds on the left side.  Pleurx catheters present on each side   Cor- regular rate and rhythm, no murmur , gallop   Abdomen- soft, non-tender   Extremities - warm, non-tender, minimal edema   Neuro- oriented, appropriate, no focal weakness   Diagnostic Tests: CT scan images performed today personally viewed and discussed with patient and wife.  Large left pleural effusion with complete compression of the left lung-trapped lung Right lung well expanded, no significant right pleural effusion.  Bilateral Pleurx catheters in place.  Impression: Patient has developed occlusion of his previously placed left Pleurx catheter with accumulation of large volume and compression of the left lung with symptomatic dyspnea on exertion. He will be prepared for surgery and exchange of Pleurx catheters in the OR tomorrow under anesthesia. Plan: Preoperative instructions reviewed with patient including diet, medications, and plan to stay overnight if he develops any problems of bleeding or oxygen dependence following the surgery.  We will plan to drain the effusion dry and allow full reexpansion of the left lung.  He understands sometimes reexpansion of the lung will result in unilateral pulmonary edema which can impair oxygenation of blood flow through the lung and require oxygen supplemental  therapy.   Len Childs, MD [facility] [phone]

## 2019-11-26 NOTE — Progress Notes (Signed)
Unable to reach patient at his phone number, tried his wife with no answer, left messages with both and no return call. I called Doren Custard, pt's son and got him. I gave him the pre-op instructions for his father. I told him if he could make sure pt got the instructions this evening. I did ask if it was unusual for his father and mother not to answer their phones and he said "sometimes". He stated that he was going to call them when we finish talking.

## 2019-11-26 NOTE — Progress Notes (Unsigned)
2  Stuart Watson. Stuart Watson Male, 86 y.o., Nov 03, 1931 MRN:  [id number] Phone:  [phone] [name]) PCP:  Wenda Low, MD Primary Cvg:  Medicare/Medicare Part A And B Next Appt With Radiology ([facility]) 11/30/2019 at 9:00 AM FW: L Pleurx Catheter Received: Yesterday Message Contents  [name], Rosalie Doctor, Gillis Santa      Hi,   I had this cased reviewed and Vernard Gambles approved for CT pleurx catheter placement. Can you please schedule this patient?   Thanks,  Ash   Previous Messages  ----- Message -----  From: Arne Cleveland, MD  Sent: 11/25/2019  2:47 PM EDT  To: Danielle Dess  Subject: RE: L Pleurx Catheter               [place]   CT guided pleurX placement LEFT    [facility]    ----- Message -----  From: Danielle Dess  Sent: 11/25/2019 12:43 PM EDT  To: Ir Procedure Requests  Subject: L Pleurx Catheter                 Procedure: L Pleurx Catheter Placement   Dx: L Pleural Effusion   Ordering: Dr. Tharon Aquas Trigt [phone])   Imaging: DG chest done 11/24/19   Please review. Dr. [name] also has a recent note in epic regarding reasoning for L Pleurx placement.   Thanks,  Lia Foyer

## 2019-11-26 NOTE — Telephone Encounter (Signed)
Returned call to pt's wife to schedule appt. No answer and no VM. JM

## 2019-11-27 ENCOUNTER — Inpatient Hospital Stay (HOSPITAL_COMMUNITY): Payer: Medicare Other | Admitting: Certified Registered Nurse Anesthetist

## 2019-11-27 ENCOUNTER — Inpatient Hospital Stay (HOSPITAL_COMMUNITY): Payer: Medicare Other

## 2019-11-27 ENCOUNTER — Ambulatory Visit (HOSPITAL_COMMUNITY)
Admission: RE | Admit: 2019-11-27 | Discharge: 2019-11-27 | Disposition: A | Discharge status: Home / Self Care | Payer: Medicare Other | Attending: Cardiothoracic Surgery | Admitting: Cardiothoracic Surgery

## 2019-11-27 ENCOUNTER — Encounter (HOSPITAL_COMMUNITY): Payer: Self-pay | Admitting: Cardiothoracic Surgery

## 2019-11-27 ENCOUNTER — Encounter (HOSPITAL_COMMUNITY)
Admission: RE | Disposition: A | Discharge status: Home / Self Care | Payer: Self-pay | Source: Home / Self Care | Attending: Cardiothoracic Surgery

## 2019-11-27 ENCOUNTER — Ambulatory Visit (HOSPITAL_COMMUNITY): Payer: Medicare Other

## 2019-11-27 DIAGNOSIS — J90 Pleural effusion, not elsewhere classified: Secondary | ICD-10-CM | POA: Insufficient documentation

## 2019-11-27 DIAGNOSIS — T85618A Breakdown (mechanical) of other specified internal prosthetic devices, implants and grafts, initial encounter: Secondary | ICD-10-CM | POA: Insufficient documentation

## 2019-11-27 DIAGNOSIS — Z8673 Personal history of transient ischemic attack (TIA), and cerebral infarction without residual deficits: Secondary | ICD-10-CM | POA: Diagnosis not present

## 2019-11-27 DIAGNOSIS — I2510 Atherosclerotic heart disease of native coronary artery without angina pectoris: Secondary | ICD-10-CM | POA: Insufficient documentation

## 2019-11-27 DIAGNOSIS — Z7902 Long term (current) use of antithrombotics/antiplatelets: Secondary | ICD-10-CM | POA: Diagnosis not present

## 2019-11-27 DIAGNOSIS — K219 Gastro-esophageal reflux disease without esophagitis: Secondary | ICD-10-CM | POA: Insufficient documentation

## 2019-11-27 DIAGNOSIS — Z79899 Other long term (current) drug therapy: Secondary | ICD-10-CM | POA: Diagnosis not present

## 2019-11-27 DIAGNOSIS — Z9689 Presence of other specified functional implants: Secondary | ICD-10-CM

## 2019-11-27 DIAGNOSIS — Y831 Surgical operation with implant of artificial internal device as the cause of abnormal reaction of the patient, or of later complication, without mention of misadventure at the time of the procedure: Secondary | ICD-10-CM | POA: Insufficient documentation

## 2019-11-27 DIAGNOSIS — E871 Hypo-osmolality and hyponatremia: Secondary | ICD-10-CM | POA: Diagnosis not present

## 2019-11-27 DIAGNOSIS — M353 Polymyalgia rheumatica: Secondary | ICD-10-CM | POA: Diagnosis not present

## 2019-11-27 DIAGNOSIS — J309 Allergic rhinitis, unspecified: Secondary | ICD-10-CM | POA: Diagnosis not present

## 2019-11-27 DIAGNOSIS — Z7952 Long term (current) use of systemic steroids: Secondary | ICD-10-CM | POA: Insufficient documentation

## 2019-11-27 DIAGNOSIS — Z4682 Encounter for fitting and adjustment of non-vascular catheter: Secondary | ICD-10-CM | POA: Diagnosis not present

## 2019-11-27 DIAGNOSIS — L605 Yellow nail syndrome: Secondary | ICD-10-CM | POA: Diagnosis not present

## 2019-11-27 DIAGNOSIS — E7800 Pure hypercholesterolemia, unspecified: Secondary | ICD-10-CM | POA: Diagnosis not present

## 2019-11-27 DIAGNOSIS — Z87891 Personal history of nicotine dependence: Secondary | ICD-10-CM | POA: Insufficient documentation

## 2019-11-27 DIAGNOSIS — J939 Pneumothorax, unspecified: Secondary | ICD-10-CM | POA: Diagnosis not present

## 2019-11-27 HISTORY — PX: CHEST TUBE INSERTION: SHX231

## 2019-11-27 HISTORY — PX: REMOVAL OF PLEURAL DRAINAGE CATHETER: SHX5080

## 2019-11-27 LAB — COMPREHENSIVE METABOLIC PANEL
ALT: 10 U/L (ref 0–44)
AST: 15 U/L (ref 15–41)
Albumin: 2.2 g/dL — ABNORMAL LOW (ref 3.5–5.0)
Alkaline Phosphatase: 44 U/L (ref 38–126)
Anion gap: 7 (ref 5–15)
BUN: 16 mg/dL (ref 8–23)
CO2: 27 mmol/L (ref 22–32)
Calcium: 7.8 mg/dL — ABNORMAL LOW (ref 8.9–10.3)
Chloride: 106 mmol/L (ref 98–111)
Creatinine, Ser: 0.96 mg/dL (ref 0.61–1.24)
GFR calc Af Amer: >60 mL/min (ref >60)
GFR calc non Af Amer: >60 mL/min (ref >60)
Glucose, Bld: 117 mg/dL — ABNORMAL HIGH (ref 70–99)
Potassium: 3.7 mmol/L (ref 3.5–5.1)
Sodium: 140 mmol/L (ref 135–145)
Total Bilirubin: 0.3 mg/dL (ref 0.3–1.2)
Total Protein: 5.7 g/dL — ABNORMAL LOW (ref 6.5–8.1)

## 2019-11-27 LAB — CBC
HCT: 37.4 % — ABNORMAL LOW (ref 39.0–52.0)
Hemoglobin: 11.8 g/dL — ABNORMAL LOW (ref 13.0–17.0)
MCH: 30.6 pg (ref 26.0–34.0)
MCHC: 31.6 g/dL (ref 30.0–36.0)
MCV: 96.9 fL (ref 80.0–100.0)
Platelets: 415 10*3/uL — ABNORMAL HIGH (ref 150–400)
RBC: 3.86 MIL/uL — ABNORMAL LOW (ref 4.22–5.81)
RDW: 13.7 % (ref 11.5–15.5)
WBC: 6.4 10*3/uL (ref 4.0–10.5)
nRBC: 0 % (ref 0.0–0.2)

## 2019-11-27 LAB — URINALYSIS, ROUTINE W REFLEX MICROSCOPIC
Bilirubin Urine: NEGATIVE
Glucose, UA: NEGATIVE mg/dL
Hgb urine dipstick: NEGATIVE
Ketones, ur: NEGATIVE mg/dL
Leukocytes,Ua: NEGATIVE
Nitrite: NEGATIVE
Protein, ur: NEGATIVE mg/dL
Specific Gravity, Urine: 1.023 (ref 1.005–1.030)
pH: 6 (ref 5.0–8.0)

## 2019-11-27 LAB — PROTIME-INR
INR: 1.1 (ref 0.8–1.2)
Prothrombin Time: 13.7 seconds (ref 11.4–15.2)

## 2019-11-27 LAB — ABO/RH: ABO/RH(D): O POS

## 2019-11-27 LAB — TYPE AND SCREEN
ABO/RH(D): O POS
Antibody Screen: NEGATIVE

## 2019-11-27 LAB — APTT: aPTT: 35 seconds (ref 24–36)

## 2019-11-27 SURGERY — INSERTION, PLEURAL DRAINAGE CATHETER
Anesthesia: Monitor Anesthesia Care | Site: Chest | Laterality: Left

## 2019-11-27 MED ORDER — ACETAMINOPHEN 500 MG PO TABS
1000.0000 mg | ORAL_TABLET | Freq: Once | ORAL | Status: AC
  Administered 2019-11-27: 1000 mg via ORAL

## 2019-11-27 MED ORDER — LIDOCAINE 2% (20 MG/ML) 5 ML SYRINGE
INTRAMUSCULAR | Status: AC
  Filled 2019-11-27: qty 5

## 2019-11-27 MED ORDER — ACETAMINOPHEN 650 MG RE SUPP: 650.0000 mg | RECTAL | Status: DC | PRN

## 2019-11-27 MED ORDER — LIDOCAINE HCL (PF) 1 % IJ SOLN
INTRAMUSCULAR | Status: AC
  Filled 2019-11-27: qty 30

## 2019-11-27 MED ORDER — LIDOCAINE 2% (20 MG/ML) 5 ML SYRINGE
INTRAMUSCULAR | Status: DC | PRN
  Administered 2019-11-27: 40 mg via INTRAVENOUS

## 2019-11-27 MED ORDER — SODIUM CHLORIDE 0.9 % IV SOLN: 250.0000 mL | INTRAVENOUS | Status: DC | PRN

## 2019-11-27 MED ORDER — PHENYLEPHRINE HCL-NACL 10-0.9 MG/250ML-% IV SOLN
INTRAVENOUS | Status: DC | PRN
  Administered 2019-11-27: 50 ug/min via INTRAVENOUS

## 2019-11-27 MED ORDER — EPHEDRINE 5 MG/ML INJ
INTRAVENOUS | Status: AC
  Filled 2019-11-27: qty 10

## 2019-11-27 MED ORDER — ONDANSETRON HCL 4 MG/2ML IJ SOLN
INTRAMUSCULAR | Status: AC
  Filled 2019-11-27: qty 2

## 2019-11-27 MED ORDER — ONDANSETRON HCL 4 MG/2ML IJ SOLN
INTRAMUSCULAR | Status: DC | PRN
  Administered 2019-11-27: 4 mg via INTRAVENOUS

## 2019-11-27 MED ORDER — SODIUM CHLORIDE 0.9% FLUSH: 3.0000 mL | INTRAVENOUS | Status: DC | PRN

## 2019-11-27 MED ORDER — SODIUM CHLORIDE 0.9% FLUSH: 3.0000 mL | Freq: Two times a day (BID) | INTRAVENOUS | Status: DC

## 2019-11-27 MED ORDER — LIDOCAINE HCL 1 % IJ SOLN
INTRAMUSCULAR | Status: DC | PRN
  Administered 2019-11-27: 6 mL

## 2019-11-27 MED ORDER — PROMETHAZINE HCL 25 MG/ML IJ SOLN: 6.2500 mg | INTRAMUSCULAR | Status: DC | PRN

## 2019-11-27 MED ORDER — MIDAZOLAM HCL 2 MG/2ML IJ SOLN
INTRAMUSCULAR | Status: AC
  Filled 2019-11-27: qty 2

## 2019-11-27 MED ORDER — PHENYLEPHRINE 40 MCG/ML (10ML) SYRINGE FOR IV PUSH (FOR BLOOD PRESSURE SUPPORT)
PREFILLED_SYRINGE | INTRAVENOUS | Status: AC
  Filled 2019-11-27: qty 10

## 2019-11-27 MED ORDER — FENTANYL CITRATE (PF) 250 MCG/5ML IJ SOLN
INTRAMUSCULAR | Status: AC
  Filled 2019-11-27: qty 5

## 2019-11-27 MED ORDER — EPHEDRINE SULFATE-NACL 50-0.9 MG/10ML-% IV SOSY
PREFILLED_SYRINGE | INTRAVENOUS | Status: DC | PRN
  Administered 2019-11-27 (×2): 10 mg via INTRAVENOUS

## 2019-11-27 MED ORDER — MIDAZOLAM HCL 5 MG/5ML IJ SOLN
INTRAMUSCULAR | Status: DC | PRN
  Administered 2019-11-27: 1 mg via INTRAVENOUS

## 2019-11-27 MED ORDER — PROPOFOL 500 MG/50ML IV EMUL
INTRAVENOUS | Status: DC | PRN
  Administered 2019-11-27: 50 ug/kg/min via INTRAVENOUS

## 2019-11-27 MED ORDER — ACETAMINOPHEN 500 MG PO TABS: 1000.0000 mg | ORAL_TABLET | Freq: Four times a day (QID) | ORAL | Status: DC

## 2019-11-27 MED ORDER — ALBUMIN HUMAN 5 % IV SOLN: INTRAVENOUS | Status: DC | PRN

## 2019-11-27 MED ORDER — FENTANYL CITRATE (PF) 100 MCG/2ML IJ SOLN: 25.0000 ug | INTRAMUSCULAR | Status: DC | PRN

## 2019-11-27 MED ORDER — LACTATED RINGERS IV SOLN: INTRAVENOUS | Status: DC | PRN

## 2019-11-27 MED ORDER — PHENYLEPHRINE 40 MCG/ML (10ML) SYRINGE FOR IV PUSH (FOR BLOOD PRESSURE SUPPORT)
PREFILLED_SYRINGE | INTRAVENOUS | Status: DC | PRN
  Administered 2019-11-27: 120 ug via INTRAVENOUS
  Administered 2019-11-27 (×2): 160 ug via INTRAVENOUS

## 2019-11-27 MED ORDER — OXYCODONE HCL 5 MG PO TABS: 5.0000 mg | ORAL_TABLET | ORAL | Status: DC | PRN

## 2019-11-27 MED ORDER — FENTANYL CITRATE (PF) 250 MCG/5ML IJ SOLN
INTRAMUSCULAR | Status: DC | PRN
  Administered 2019-11-27 (×2): 25 ug via INTRAVENOUS

## 2019-11-27 MED ORDER — PROPOFOL 10 MG/ML IV BOLUS
INTRAVENOUS | Status: AC
  Filled 2019-11-27: qty 20

## 2019-11-27 MED ORDER — ACETAMINOPHEN 500 MG PO TABS
ORAL_TABLET | ORAL | Status: AC
  Filled 2019-11-27: qty 2

## 2019-11-27 MED ORDER — CELECOXIB 200 MG PO CAPS
200.0000 mg | ORAL_CAPSULE | Freq: Once | ORAL | Status: AC
  Administered 2019-11-27: 10:00:00 200 mg via ORAL

## 2019-11-27 MED ORDER — CEFAZOLIN SODIUM-DEXTROSE 2-4 GM/100ML-% IV SOLN
2.0000 g | INTRAVENOUS | Status: AC
  Administered 2019-11-27: 2 g via INTRAVENOUS

## 2019-11-27 MED ORDER — ACETAMINOPHEN 325 MG PO TABS: 650.0000 mg | ORAL_TABLET | ORAL | Status: DC | PRN

## 2019-11-27 MED ORDER — LACTATED RINGERS IV SOLN: INTRAVENOUS | Status: DC

## 2019-11-27 MED ORDER — CELECOXIB 200 MG PO CAPS
ORAL_CAPSULE | ORAL | Status: AC
  Filled 2019-11-27: qty 1

## 2019-11-27 MED ORDER — CEFAZOLIN SODIUM-DEXTROSE 2-4 GM/100ML-% IV SOLN
INTRAVENOUS | Status: AC
  Filled 2019-11-27: qty 100

## 2019-11-27 MED ORDER — 0.9 % SODIUM CHLORIDE (POUR BTL) OPTIME
TOPICAL | Status: DC | PRN
  Administered 2019-11-27: 1000 mL

## 2019-11-27 MED ORDER — PROPOFOL 1000 MG/100ML IV EMUL
INTRAVENOUS | Status: AC
  Filled 2019-11-27: qty 100

## 2019-11-27 SURGICAL SUPPLY — 82 items
BAG DECANTER FOR FLEXI CONT (MISCELLANEOUS) IMPLANT
BLADE CLIPPER SURG (BLADE) ×2 IMPLANT
BLADE SURG 11 STRL SS (BLADE) ×3 IMPLANT
CANISTER SUCT 3000ML PPV (MISCELLANEOUS) ×4 IMPLANT
CATH KIT ON-Q SILVERSOAK 5 (CATHETERS) IMPLANT
CATH KIT ON-Q SILVERSOAK 5IN (CATHETERS) IMPLANT
CATH ROBINSON RED A/P 22FR (CATHETERS) IMPLANT
CATH THORACIC 28FR (CATHETERS) IMPLANT
CATH THORACIC 28FR RT ANG (CATHETERS) IMPLANT
CATH THORACIC 36FR (CATHETERS) IMPLANT
CNTNR URN SCR LID CUP LEK RST (MISCELLANEOUS) ×2 IMPLANT
CONT SPEC 4OZ STRL OR WHT (MISCELLANEOUS)
COVER SURGICAL LIGHT HANDLE (MISCELLANEOUS) ×3 IMPLANT
DERMABOND ADVANCED (GAUZE/BANDAGES/DRESSINGS)
DERMABOND ADVANCED .7 DNX12 (GAUZE/BANDAGES/DRESSINGS) ×1 IMPLANT
DRAPE C-ARM 42X72 X-RAY (DRAPES) ×3 IMPLANT
DRAPE CHEST BREAST 15X10 FENES (DRAPES) IMPLANT
DRAPE LAPAROSCOPIC ABDOMINAL (DRAPES) ×4 IMPLANT
DRAPE SLUSH/WARMER DISC (DRAPES) ×1 IMPLANT
ELECT REM PT RETURN 9FT ADLT (ELECTROSURGICAL) ×3
ELECTRODE REM PT RTRN 9FT ADLT (ELECTROSURGICAL) ×2 IMPLANT
GAUZE SPONGE 2X2 8PLY STRL LF (GAUZE/BANDAGES/DRESSINGS) IMPLANT
GAUZE SPONGE 4X4 12PLY STRL (GAUZE/BANDAGES/DRESSINGS) ×1 IMPLANT
GLOVE BIO SURGEON STRL SZ 6.5 (GLOVE) ×2 IMPLANT
GLOVE BIO SURGEON STRL SZ7.5 (GLOVE) ×8 IMPLANT
GLOVE BIO SURGEONS STRL SZ 6.5 (GLOVE) ×2
GLOVE BIOGEL M STRL SZ7.5 (GLOVE) ×1 IMPLANT
GLOVE BIOGEL PI IND STRL 6.5 (GLOVE) IMPLANT
GLOVE BIOGEL PI INDICATOR 6.5 (GLOVE) ×2
GOWN STRL REUS W/ TWL LRG LVL3 (GOWN DISPOSABLE) ×5 IMPLANT
GOWN STRL REUS W/TWL LRG LVL3 (GOWN DISPOSABLE) ×9
KIT BASIN OR (CUSTOM PROCEDURE TRAY) ×4 IMPLANT
KIT PLEURX DRAIN CATH 1000ML (MISCELLANEOUS) ×7 IMPLANT
KIT PLEURX DRAIN CATH 15.5FR (DRAIN) ×3 IMPLANT
KIT SUCTION CATH 14FR (SUCTIONS) ×1 IMPLANT
KIT TURNOVER KIT B (KITS) ×4 IMPLANT
NDL HYPO 25GX1X1/2 BEV (NEEDLE) ×1 IMPLANT
NEEDLE HYPO 25GX1X1/2 BEV (NEEDLE) ×3 IMPLANT
NS IRRIG 1000ML POUR BTL (IV SOLUTION) ×5 IMPLANT
PACK CHEST (CUSTOM PROCEDURE TRAY) ×1 IMPLANT
PACK GENERAL/GYN (CUSTOM PROCEDURE TRAY) ×3 IMPLANT
PAD ARMBOARD 7.5X6 YLW CONV (MISCELLANEOUS) ×4 IMPLANT
SEALANT SURG COSEAL 4ML (VASCULAR PRODUCTS) IMPLANT
SET DRAINAGE LINE (MISCELLANEOUS) IMPLANT
SOL ANTI FOG 6CC (MISCELLANEOUS) ×1 IMPLANT
SOLUTION ANTI FOG 6CC (MISCELLANEOUS)
SPONGE GAUZE 2X2 STER 10/PKG (GAUZE/BANDAGES/DRESSINGS) ×2
SPONGE TONSIL TAPE 1 RFD (DISPOSABLE) ×1 IMPLANT
SUT CHROMIC 3 0 SH 27 (SUTURE) IMPLANT
SUT ETHILON 3 0 FSL (SUTURE) ×4 IMPLANT
SUT ETHILON 3 0 PS 1 (SUTURE) ×1 IMPLANT
SUT PROLENE 3 0 SH DA (SUTURE) IMPLANT
SUT PROLENE 4 0 RB 1 (SUTURE)
SUT PROLENE 4-0 RB1 .5 CRCL 36 (SUTURE) IMPLANT
SUT SILK  1 MH (SUTURE)
SUT SILK 1 MH (SUTURE) ×2 IMPLANT
SUT SILK 2 0 SH (SUTURE) ×1 IMPLANT
SUT SILK 2 0 SH CR/8 (SUTURE) ×2 IMPLANT
SUT SILK 2 0SH CR/8 30 (SUTURE) IMPLANT
SUT SILK 3 0SH CR/8 30 (SUTURE) IMPLANT
SUT VIC AB 1 CTX 18 (SUTURE) IMPLANT
SUT VIC AB 2 TP1 27 (SUTURE) IMPLANT
SUT VIC AB 2-0 CT2 18 VCP726D (SUTURE) IMPLANT
SUT VIC AB 2-0 CTX 36 (SUTURE) IMPLANT
SUT VIC AB 3-0 SH 18 (SUTURE) IMPLANT
SUT VIC AB 3-0 SH 8-18 (SUTURE) ×3 IMPLANT
SUT VIC AB 3-0 X1 27 (SUTURE) IMPLANT
SUT VICRYL 0 UR6 27IN ABS (SUTURE) IMPLANT
SUT VICRYL 2 TP 1 (SUTURE) IMPLANT
SWAB COLLECTION DEVICE MRSA (MISCELLANEOUS) IMPLANT
SWAB CULTURE ESWAB REG 1ML (MISCELLANEOUS) IMPLANT
SYR CONTROL 10ML LL (SYRINGE) ×3 IMPLANT
SYSTEM SAHARA CHEST DRAIN ATS (WOUND CARE) ×1 IMPLANT
TIP APPLICATOR SPRAY EXTEND 16 (VASCULAR PRODUCTS) IMPLANT
TOWEL GREEN STERILE (TOWEL DISPOSABLE) ×4 IMPLANT
TOWEL GREEN STERILE FF (TOWEL DISPOSABLE) ×2 IMPLANT
TRAP SPECIMEN MUCOUS 40CC (MISCELLANEOUS) IMPLANT
TRAY FOLEY MTR SLVR 16FR STAT (SET/KITS/TRAYS/PACK) ×1 IMPLANT
TROCAR XCEL NON-BLD 5MMX100MML (ENDOMECHANICALS) IMPLANT
TUNNELER SHEATH ON-Q 11GX8 DSP (PAIN MANAGEMENT) IMPLANT
VALVE REPLACEMENT CAP (MISCELLANEOUS) IMPLANT
WATER STERILE IRR 1000ML POUR (IV SOLUTION) ×3 IMPLANT

## 2019-11-27 NOTE — OR Nursing (Signed)
TOTAL OF 2400 CC OF PLEURAL FLUID DRAINED AFTER PLACEMENT OF 15.5 FR PLEURX CATH. DRAINAGE REDDISH MAROON IN COLORATION.

## 2019-11-27 NOTE — Anesthesia Preprocedure Evaluation (Addendum)
Anesthesia Evaluation  Patient identified by MRN, date of birth, ID band Patient awake    Reviewed: Allergy & Precautions, NPO status , Patient's Chart, lab work & pertinent test results  History of Anesthesia Complications Negative for: history of anesthetic complications  Airway Mallampati: II  TM Distance: >3 FB Neck ROM: Full    Dental no notable dental hx.    Pulmonary former smoker,  Chronic bilateral pleural effusions s/p bilateral pleurex catheters   Pulmonary exam normal        Cardiovascular + CAD (on Plavix)  Normal cardiovascular exam  Nuclear stress 2019: EF: 65%, large defect of severe severity present in the basal inferior, mid inferior, apical septal, apical inferior, apical lateral and apex location. The defect is mainly fixed but there is a small area of reversibility in the inferolateral wall. In the setting of normal LVF, this is most likely diaphragmatic attenuation artifact but cannot rule out ischemia in the inferolateral wall, intermediate risk study.  Cardiac cath 2019: Ost LAD lesion 30% stenosed, no interventions   Neuro/Psych TIA (2014)   GI/Hepatic Neg liver ROS, GERD  Medicated and Controlled,  Endo/Other  negative endocrine ROS  Renal/GU negative Renal ROS     Musculoskeletal  (+) Arthritis , Polymyalgia rheumatica on chronic prednisone   Abdominal   Peds  Hematology negative hematology ROS (+)   Anesthesia Other Findings Day of surgery medications reviewed with the patient.  Reproductive/Obstetrics                            Anesthesia Physical  Anesthesia Plan  ASA: III  Anesthesia Plan: MAC   Post-op Pain Management:    Induction: Intravenous  PONV Risk Score and Plan: 2 and Ondansetron and Propofol infusion  Airway Management Planned: Natural Airway, Simple Face Mask and Nasal Cannula  Additional Equipment: None  Intra-op Plan:    Post-operative Plan:   Informed Consent: I have reviewed the patients History and Physical, chart, labs and discussed the procedure including the risks, benefits and alternatives for the proposed anesthesia with the patient or authorized representative who has indicated his/her understanding and acceptance.     Dental advisory given  Plan Discussed with: CRNA, Anesthesiologist and Surgeon  Anesthesia Plan Comments: ( )       Anesthesia Quick Evaluation

## 2019-11-27 NOTE — Transfer of Care (Signed)
Immediate Anesthesia Transfer of Care Note  Patient: Stuart Watson  Procedure(s) Performed: REDO INSERTION PLEURAL DRAINAGE CATHETER USING 15.5 FR. PLEURX CATH (Left Chest) REMOVAL OF PLEURAL DRAINAGE CATHETER (Left Chest)  Patient Location: PACU  Anesthesia Type:MAC  Level of Consciousness: awake, alert  and oriented  Airway & Oxygen Therapy: Patient Spontanous Breathing and Patient connected to nasal cannula oxygen  Post-op Assessment: Report given to RN, Post -op Vital signs reviewed and stable and Patient moving all extremities X 4  Post vital signs: Reviewed and stable  Last Vitals:  Vitals Value Taken Time  BP 97/45 11/27/19 1145  Temp    Pulse 71 11/27/19 1146  Resp 17 11/27/19 1146  SpO2 100 % 11/27/19 1146  Vitals shown include unvalidated device data.  Last Pain:  Vitals:   11/27/19 0700  TempSrc: Oral      Patients Stated Pain Goal: 3 (46/56/81 2751)  Complications: No apparent anesthesia complications

## 2019-11-27 NOTE — Brief Op Note (Signed)
11/27/2019  11:43 AM  PATIENT:  Stuart Watson  86 y.o. male  PRE-OPERATIVE DIAGNOSIS:  LEFT EFFUSION  POST-OPERATIVE DIAGNOSIS:  LEFT PLEURAL EFFUSION  PROCEDURE:  Procedure(s): REDO INSERTION PLEURAL DRAINAGE CATHETER USING 15.5 FR. PLEURX CATH (Left) REMOVAL OF PLEURAL DRAINAGE CATHETER (Left)  Drainage of 2.5 L Left Pleural Effusion  SURGEON:  Surgeon(s) and Role:    Ivin Poot, MD - Primary  PHYSICIAN ASSISTANT:   ASSISTANTS: none   ANESTHESIA:   local and IV sedation  EBL:  10 ml  BLOOD ADMINISTERED:none  DRAINS: Left Pleurx catheter   LOCAL MEDICATIONS USED:  LIDOCAINE  and Amount: 12 ml  SPECIMEN:  No Specimen  DISPOSITION OF SPECIMEN:  N/A  COUNTS:  YES  TOURNIQUET:  * No tourniquets in log *  DICTATION: .Dragon Dictation  PLAN OF CARE: Discharge to home after PACU  PATIENT DISPOSITION:  PACU - hemodynamically stable.   Delay start of Pharmacological VTE agent (>24hrs) due to surgical blood loss or risk of bleeding: yes

## 2019-11-27 NOTE — OR Nursing (Signed)
New left pleurX cath placed by Dr. [name].

## 2019-11-27 NOTE — OR Nursing (Signed)
Stuart Watson CATH REMOVED BY DR. Lucianne Lei TRIGT.

## 2019-11-27 NOTE — H&P (Signed)
PCP is Wenda Low, MD Referring Provider is No ref. provider found  No chief complaint on file.   HPI: The patient presents with complaints of increasing shortness of breath with activity. He has had bilateral Pleurx catheters placed almost 2 years ago in the left catheter has stopped draining.  This was preceded by some bleeding and probable fibrin stranding in the Pleurx catheter. Chest x-ray to performed today shows complete opacification of the left hemithorax consistent with large pleural effusion. The Pleurx catheter has been flushed with sterile saline but still cannot drain. Patient will need a new Pleurx catheter.  I discussed with him performing a office thoracentesis today to help improve his symptoms until the Pleurx catheter can be placed. A left thoracentesis was performed under informed consent with sterile technique and local 1% lidocaine and proper timeout. Approximately 50 cc of clear yellow fluid was removed but then the drainage stopped.  This is consistent with loculated left pleural effusion. For this reason the best approach to getting a functional new Pleurx catheter on left side is CT-guided Pleurx catheter placed by interventional radiology.  This will be set up as outpatient. On the day that the CT-guided left Pleurx catheter by IR was placed the patient will have the current nonfunctional Pleurx catheter removed in the short stay area/outpatient facility of the hospital I have discussed this plan with the patient and he understands.  I feel that it is safe to form this as an outpatient with the patient's O2 saturation 95% on room air and his right lung is clear.  Past Medical History:  Diagnosis Date  . Actinic keratoses   . Arthritis   . Chronic bilateral pleural effusions   . Chronic sinusitis   . Colon polyps   . Dyspnea 07/13/2013   BNP normal  . Edema 07/13/2013  . GERD (gastroesophageal reflux disease)    omeprazole 1-2 times daily  . H/O TIA  (transient ischemic attack) and stroke    on statins and plavix -saw Dr.Sethi in the past  . Headache   . History of BPH   . History of TIA (transient ischemic attack) 07/13/2013  . Left inguinal hernia   . Left inguinal hernia 01/27/2019  . Lumbar radiculopathy   . Need for shingles vaccine   . Optic neuritis 10/2015   left eye  . Perennial allergic rhinitis   . Polymyalgia rheumatica ([place]) 07/13/2013   Daily prednisone  . Restless leg syndrome   . Retinal disease    right eye since birth   . Shingles   . Temporal arteritis ([place]) 10/2015  . Wears glasses   . 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 BRAVO [place] STUDY N/A 04/18/2016   Procedure: BRAVO [place];  Surgeon: Wilford Corner, MD;  [facility];  Service: Endoscopy;  Laterality: N/A;  . CHEST TUBE INSERTION Right 02/13/2018   Procedure: INSERTION PLEURAL DRAINAGE CATHETER;  Surgeon: Ivin Poot, MD;  Location: [facility];  Service: Thoracic;  Laterality: Right;  . COLONOSCOPY    . drropy eyelid surgery    . ESOPHAGOGASTRODUODENOSCOPY (EGD) WITH PROPOFOL N/A 04/18/2016   Procedure: ESOPHAGOGASTRODUODENOSCOPY (EGD) WITH PROPOFOL;  Surgeon: Wilford Corner, MD;  [facility];  Service: Endoscopy;  Laterality: N/A;  . INGUINAL HERNIA REPAIR Left 01/27/2019   Procedure: OPEN REPAIR LEFT INGUINAL HERNIA WITH MESH;  Surgeon: Fanny Skates, MD;  Location: [facility];  Service: General;  Laterality: Left;  GENERAL  AND TAP BLOCK ANESTHESIA  . INSERTION OF MESH Left 01/27/2019   Procedure: Insertion Of Mesh;  Surgeon: Fanny Skates, MD;  Location: [facility];  Service: General;  Laterality: Left;  . IR THORACENTESIS ASP PLEURAL SPACE W/IMG GUIDE  02/04/2018  . IR THORACENTESIS ASP PLEURAL SPACE W/IMG GUIDE  02/06/2018  . RIGHT/LEFT HEART CATH AND CORONARY ANGIOGRAPHY N/A 06/24/2018   Procedure: RIGHT/LEFT HEART CATH AND CORONARY ANGIOGRAPHY;  Surgeon: Jolaine Artist, MD;   Location: [facility];  Service: Cardiovascular;  Laterality: N/A;  . VASECTOMY      Family History  Problem Relation Age of Onset  . Stroke Mother   . Coronary artery disease Mother   . Diabetes Mother   . Breast cancer Mother   . Heart attack Father   . Coronary artery disease Father   . Diabetes Father   . Graves' disease Sister     Social History Social History   Tobacco Use  . Smoking status: Former Smoker    Packs/day: 1.00    Years: 15.00    Pack years: 15.00    Types: Cigarettes    Quit date: 07/23/1975    Years since quitting: 44.3  . Smokeless tobacco: Never Used  Substance Use Topics  . Alcohol use: Yes    Alcohol/week: 7.0 standard drinks    Types: 7 Glasses of wine per week    Comment: 1 glass red wine daily  . Drug use: No    Current Facility-Administered Medications  Medication Dose Route Frequency Provider Last Rate Last Admin  . 0.9 % irrigation (POUR BTL)    PRN [name], Collier Salina, MD   1,000 mL at 11/27/19 1025  . acetaminophen (TYLENOL) 500 MG tablet           . ceFAZolin (ANCEF) 2-4 GM/100ML-% IVPB           . ceFAZolin (ANCEF) IVPB 2g/100 mL premix  2 g Intravenous 30 min Pre-Op [name], Megumi Treaster, MD      . celecoxib (CELEBREX) 200 MG capsule           . lactated ringers infusion   Intravenous Continuous Duane Boston, MD      . lidocaine (XYLOCAINE) 1 % (with pres) injection    PRN [name], Collier Salina, MD   15 mL at 11/27/19 1026   Facility-Administered Medications Ordered in Other Encounters  Medication Dose Route Frequency Provider Last Rate Last Admin  . lactated ringers infusion   Intravenous Continuous PRN Harden Mo, CRNA   New Bag at 11/27/19 [phone]    No Known Allergies  Review of Systems  Weight is stable Lower extremity edema is stable Shortness of breath with almost any activity No fever or productive cough No syncope or falls No chest pain or palpitations No new abdominal pain or swelling  BP 128/60   Pulse 99   Temp  97.8 F (36.6 C) (Oral)   Resp 18   Ht 5' 5.5" (1.664 m)   Wt 68.3 kg   SpO2 100%   BMI 24.68 kg/m  Physical Exam Alert and oriented and appropriate breathing comfortably at rest Minimal JVD Diminished breath sounds on the left side clear on the right Heart rhythm regular without murmur Abdomen without distention or tenderness 1-2+ bilateral ankle edema No focal neuro deficit  Diagnostic Tests: Chest x-ray performed prior to his exam shows a large left left pleural effusion with opacification of the left hemithorax  Impression: Patient has been battling recurrent  bilateral pleural effusions or over 2 years.  He continues to take torsemide.  He will need a new left Pleurx catheter--I feel the best way to place this is through CT-guided imaging by interventional radiology since the pleural effusion is probably loculated.  This will be set up as an outpatient and then the patient will return for further follow-up of his catheters as he has been for the past 2 years.  Plan: Return in approximately 2 weeks after Pleurx catheter exchange.  I will leave the current Pleurx catheter in place in case IR wishes to use that to wire a new catheter or to try lytic therapy.  The patient will continue to hold his Plavix which he has been off for almost a month.  He will continue his other meds including aspirin 81 mg   Tharon Aquas Adelene Idler, MD [facility] [phone]    Nov 27, 2019 No change in patient symptoms or exam Labs reviewed P [name] MD Ready for surgery

## 2019-11-27 NOTE — Anesthesia Postprocedure Evaluation (Signed)
Anesthesia Post Note  Patient: VONTRELL PULLMAN  Procedure(s) Performed: REDO INSERTION PLEURAL DRAINAGE CATHETER USING 15.5 FR. PLEURX CATH (Left Chest) REMOVAL OF PLEURAL DRAINAGE CATHETER (Left Chest)     Patient location during evaluation: PACU Anesthesia Type: MAC Level of consciousness: awake and alert Pain management: pain level controlled Vital Signs Assessment: post-procedure vital signs reviewed and stable Respiratory status: spontaneous breathing and respiratory function stable Cardiovascular status: stable Postop Assessment: no apparent nausea or vomiting Anesthetic complications: no    Last Vitals:  Vitals:   11/27/19 1200 11/27/19 1209  BP: (!) 105/53 (!) 110/51  Pulse: 70 71  Resp: 15 12  Temp:  (!) 36.2 C  SpO2: 98% 97%    Last Pain:  Vitals:   11/27/19 1209  TempSrc:   PainSc: 3                  Wallace Cogliano,Antone DANIEL

## 2019-11-27 NOTE — Discharge Instructions (Signed)
Office will call for followup appt Keep dressing dry until 5-9 No driving for 2 days Resume preop Pleurx drainage schedule on Monday

## 2019-11-27 NOTE — Anesthesia Procedure Notes (Signed)
Procedure Name: MAC Date/Time: 11/27/2019 10:38 AM Performed by: Harden Mo, CRNA Pre-anesthesia Checklist: Patient identified, Emergency Drugs available, Suction available and Patient being monitored Patient Re-evaluated:Patient Re-evaluated prior to induction Oxygen Delivery Method: Simple face mask Preoxygenation: Pre-oxygenation with 100% oxygen Induction Type: IV induction Placement Confirmation: positive ETCO2 and breath sounds checked- equal and bilateral Dental Injury: Teeth and Oropharynx as per pre-operative assessment

## 2019-11-27 NOTE — Progress Notes (Signed)
Pre Procedure note for inpatients:   Stuart Watson has been scheduled for Procedure(s): REDO INSERTION PLEURAL DRAINAGE CATHETER (Left) possible VIDEO ASSISTED THORACOSCOPY (Left) REMOVAL OF PLEURAL DRAINAGE CATHETER (Left) today. The various methods of treatment have been discussed with the patient. After consideration of the risks, benefits and treatment options the patient has consented to the planned procedure.   The patient has been seen and labs reviewed. There are no changes in the patient's condition to prevent proceeding with the planned procedure today.  Recent labs:  Lab Results  Component Value Date   WBC 6.9 02/23/2019   HGB 12.4 (L) 02/23/2019   HCT 37.8 (L) 02/23/2019   PLT 317 02/23/2019   GLUCOSE 106 (H) 08/31/2019   ALT 12 08/31/2019   AST 18 08/31/2019   NA 141 08/31/2019   K 3.8 08/31/2019   CL 103 08/31/2019   CREATININE 1.40 (H) 08/31/2019   BUN 21 08/31/2019   CO2 27 08/31/2019   TSH 5.91 (H) 01/20/2018   INR 1.10 06/18/2018    Len Childs, MD 11/27/2019 8:19 AM

## 2019-11-28 NOTE — Op Note (Signed)
NAME: Stuart Watson, Stuart Watson MEDICAL RECORD [id number] ACCOUNT [id number] DATE OF BIRTH:Nov 23, 1931 FACILITY: [facility] LOCATION: [facility] PHYSICIAN:Linden Tagliaferro VAN TRIGT III, MD  OPERATIVE REPORT  DATE OF PROCEDURE:  11/27/2019  OPERATION: 1.  Removal of nonfunctional left PleurX catheter. 2.  Placement of new left PleurX catheter for recurrent large left pleural effusion.  SURGEON:  Ivin Poot, MD  ANESTHESIA:  Local 1% lidocaine with IV monitored conscious sedation by the anesthesia team.  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  DESCRIPTION OF PROCEDURE:  After the patient was evaluated in preoperative holding where informed consent was documented, final issues were addressed as well as review of recent laboratory values, the patient was brought back to the operating room.  He  was placed supine with a roll under the left side.  He was given IV conscious sedation and monitored by anesthesia.  The previously placed dressing over the PleurX catheter was removed and the chest, including the PleurX catheter was prepped and draped  as a sterile field.  A proper time-out was performed.  First, an incision was made at the exit site of the existing nonfunctional PleurX catheter.  Subcutaneous tissue was dissected off the catheter wall and the catheter was removed with some difficulty.  A counterincision more proximally it was required to  free up the catheter.  The tract was irrigated and then closed in layers using subcutaneous Vicryl and interrupted nylon on the skin.  Next, a new left PleurX catheter was placed in the anterior axillary line at the 5th interspace.  Lidocaine was infiltrated in this area.  Using the  Seldinger technique, a guidewire was passed into the left pleural space and confirmed by C-arm  fluoroscopy.  Under local anesthesia, a second lower incision was made for the exit site of the PleurX catheter.  The new PleurX catheter was then tunneled from the upper incision to the lower incision.  Next, over the guidewire, the dilator and the  dilator sheath were inserted in the pleural space.  Clear yellow fluid exited.  Next, using the tear-away sheath, the new PleurX catheter was fed into the pleural space and position confirmed by C-arm fluoroscopy and confirmation of a straight catheter  without kinks was made.  Next, the catheter was set to suction and drained 2.5 L of fluid.  The upper incision was then closed with interrupted Vicryl for the subcutaneous layer and interrupted nylon on the skin.  The lower incision at the catheter exit site was secured with a silk suture tied around the catheter.  Sterile dressings were then  placed over all incisions and covered with the PleurX Tegaderm sheet.  The patient was then taken to the recovery room.  A chest x-ray there showed the PleurX catheter in good position with good aeration of the left lung.  VN/NUANCE  D:11/27/2019 T:11/28/2019 JOB:[id number]

## 2019-11-30 ENCOUNTER — Ambulatory Visit (HOSPITAL_COMMUNITY): Payer: Medicare Other

## 2019-12-07 ENCOUNTER — Encounter (HOSPITAL_COMMUNITY): Payer: Self-pay

## 2019-12-07 ENCOUNTER — Emergency Department (HOSPITAL_COMMUNITY): Payer: Medicare Other

## 2019-12-07 ENCOUNTER — Emergency Department (HOSPITAL_COMMUNITY)
Admission: EM | Admit: 2019-12-07 | Discharge: 2019-12-07 | Disposition: A | Discharge status: Home / Self Care | Payer: Medicare Other | Attending: Emergency Medicine | Admitting: Emergency Medicine

## 2019-12-07 ENCOUNTER — Other Ambulatory Visit: Payer: Self-pay

## 2019-12-07 DIAGNOSIS — Z452 Encounter for adjustment and management of vascular access device: Secondary | ICD-10-CM | POA: Diagnosis not present

## 2019-12-07 DIAGNOSIS — T859XXA Unspecified complication of internal prosthetic device, implant and graft, initial encounter: Secondary | ICD-10-CM | POA: Insufficient documentation

## 2019-12-07 DIAGNOSIS — Z4682 Encounter for fitting and adjustment of non-vascular catheter: Secondary | ICD-10-CM | POA: Diagnosis present

## 2019-12-07 DIAGNOSIS — Z87891 Personal history of nicotine dependence: Secondary | ICD-10-CM | POA: Diagnosis not present

## 2019-12-07 DIAGNOSIS — Z79899 Other long term (current) drug therapy: Secondary | ICD-10-CM | POA: Insufficient documentation

## 2019-12-07 DIAGNOSIS — R0602 Shortness of breath: Secondary | ICD-10-CM | POA: Diagnosis not present

## 2019-12-07 DIAGNOSIS — Y712 Prosthetic and other implants, materials and accessory cardiovascular devices associated with adverse incidents: Secondary | ICD-10-CM | POA: Diagnosis not present

## 2019-12-07 DIAGNOSIS — J90 Pleural effusion, not elsewhere classified: Secondary | ICD-10-CM | POA: Diagnosis not present

## 2019-12-07 NOTE — ED Notes (Signed)
Pt d/c home per MD order. Discharge summary reviewed with pt, pt verbalizes understanding. No s/s of acute distress noted at discharge. Off unit via [facility]- Discharged home with wife.

## 2019-12-07 NOTE — Discharge Instructions (Signed)
You were seen in the emergency department for evaluation of bleeding from your chest tube.  Your chest x-ray showed it was in good position.  CT surgery evaluated you and felt you are safe to follow-up in the office on Wednesday as scheduled.  Return to the emergency department for any worsening or concerning symptoms

## 2019-12-07 NOTE — Progress Notes (Signed)
[address]       [place] [address]             [phone]         Subjective:  DATE OF PROCEDURE:  11/27/2019  OPERATION: 1.  Removal of nonfunctional left PleurX catheter. 2.  Placement of new left PleurX catheter for recurrent large left pleural effusion.  SURGEON:  Ivin Poot, MD  ANESTHESIA:  Local 1% lidocaine with IV monitored conscious sedation by the anesthesia team.  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   History of the present illness: We are asked to see this patient who presents the emergency department.  He is status post the above procedure last week by Dr. Darcey Nora.  He has a long history of yellow nail syndrome with recurrent bilateral pleural effusions.  The nonfunctional left Pleurx was removed and a new one was placed.  It was drained 1 time on Friday of last week and the family is uncertain of the amount but they believe there was a greater than a liter of fluid.  On attempt to drain the catheter today the drainage was noted to be more bloody in appearance and there were also some clots.  The patient has been on Plavix but this was stopped prior to the procedure.  He is currently on a baby aspirin.  He denies significant shortness of breath and has only minor discomforts related to the catheter.  A chest x-ray reveals a moderate left effusion with improvement in the previous pneumothorax.     Objective: Vital signs in last 24 hours: Temp:  [97.5 F (36.4 C)-98.7 F (37.1 C)] 97.5 F (36.4 C) (05/17 1330) Pulse Rate:  [66-88] 66 (05/17 1430) Resp:  [14-16] 16 (05/17 1430) BP: (114-129)/(53-61) 123/61 (05/17 1430) SpO2:  [98 %-100 %] 100 %  (05/17 1430) Weight:  [64.9 kg] 64.9 kg (05/17 1016)  Hemodynamic parameters for last 24 hours:    Intake/Output from previous day: No intake/output data recorded. Intake/Output this shift: No intake/output data recorded.  General appearance: alert, cooperative and no distress Heart: regular rate and rhythm Lungs: dim in L>R base Abdomen: benign Wound: dressing CDI  Lab Results: No results for input(s): WBC, HGB, HCT, PLT in the last 72 hours. BMET: No results for input(s): NA, K, CL, CO2, GLUCOSE, BUN, CREATININE, CALCIUM in the last 72 hours.  PT/INR: No results for input(s): LABPROT, INR in the last 72 hours. ABG    Component Value Date/Time   PHART 7.413 06/24/2018 1334   HCO3 26.9 06/24/2018 1334   TCO2 28 06/24/2018 1334   O2SAT 77.0 06/24/2018 1334   CBG (last 3)  No results for input(s): GLUCAP in the last 72 hours.  Meds Scheduled Meds: Continuous Infusions: PRN Meds:.  Xrays DG Chest 2 View  Result Date: 12/07/2019 CLINICAL DATA:  Pleural catheter drainage EXAM: CHEST - 2 VIEW COMPARISON:  Nov 27, 2019 FINDINGS: Chest tube is present on the left with the tip anterior and medial in the left hemithorax. There is also a PleurX drainage catheter on the right inferiorly. The previously noted pneumothorax on the left has nearly completely resolved, although there is apparent small loculated pneumothorax on the left posteriorly and inferiorly. Scattered air-fluid levels elsewhere potentially  could represent foci of infection. There is moderate partially loculated effusion on the left. On the right, there is minimal pleural effusion. Right lung otherwise clear. Heart size normal. Pulmonary vascularity within normal limits. No adenopathy evident. IMPRESSION: Partially loculated pleural effusion on the left. Air-fluid levels on the left with probable small loculated pneumothorax inferiorly and posteriorly on the left with other areas of questionable loculated air versus  residual infection. Minimal pleural effusion on the right with PleurX catheter on the right. No edema or frank consolidation noted. Note that pneumothorax on the left seen on most recent study has virtually completely resolved. Stable cardiac silhouette. Electronically Signed   By: Lowella Grip III M.D.   On: 12/07/2019 10:43    Assessment/Plan:  The patient is newly placed left Pleurx is currently not functioning as well as during the first attempt.  The family was concerned that the drainage was more bloody than previously.  They stopped draining the catheter at approximately 100 cc of bloody fluid.  He appears to be clinically stable and has a appointment to see Dr. Darcey Nora on Wednesday of this week.  I will asked him to bring a drainage bottle with him to that appointment and not drain again until he sees the surgeon.  He is also instructed that if he gets in any distress to return immediately to the emergency department.  Currently vital signs are very stable and he is not requiring any supplemental oxygen.   LOS: 0 days    John Giovanni [facility] 12/07/2019

## 2019-12-07 NOTE — ED Triage Notes (Signed)
Pt has bilateral pleural drainage catheters, left sided catheter was changed last Friday by Dr [name]. Pt noticed bright red blood in catheter this morning, no redness or swelling around the incision site, denies chest pain or SOB. Pt a.o, nad noted

## 2019-12-07 NOTE — ED Provider Notes (Signed)
[facility] EMERGENCY DEPARTMENT Provider Note   CSN: [id number] Arrival date & time: 12/07/19  1001     History Chief Complaint  Patient presents with  . Drainage from Incision    Stuart Watson is a 86 y.o. male.  He has a history of chronic pleural effusions and has bilateral Pleurx catheters.  He saw CT surgery Dr. Darcey Nora about a week and a half ago for decreased function of the left tube.  This tube was changed by Dr. Darcey Nora.  The patient today noticed very low output from the tube which was very bloody with some clot in it.  He is still having some pain since the new catheter was placed.  No change in his shortness of breath.  No fevers.  The history is provided by the patient.  Shortness of Breath Severity:  Moderate Onset quality:  Gradual Timing:  Intermittent Progression:  Unchanged Chronicity:  Chronic Relieved by:  Nothing Worsened by:  Nothing Ineffective treatments: draining catheter. Associated symptoms: chest pain   Associated symptoms: no abdominal pain, no cough, no fever, no headaches, no hemoptysis, no neck pain, no rash and no sore throat        Past Medical History:  Diagnosis Date  . Actinic keratoses   . Arthritis   . Chronic bilateral pleural effusions   . Chronic sinusitis   . Colon polyps   . Dyspnea 07/13/2013   BNP normal  . Edema 07/13/2013  . GERD (gastroesophageal reflux disease)    omeprazole 1-2 times daily  . H/O TIA (transient ischemic attack) and stroke    on statins and plavix -saw Dr.Sethi in the past  . Headache   . History of BPH   . History of TIA (transient ischemic attack) 07/13/2013  . Left inguinal hernia   . Left inguinal hernia 01/27/2019  . Lumbar radiculopathy   . Need for shingles vaccine   . Optic neuritis 10/2015   left eye  . Perennial allergic rhinitis   . Polymyalgia rheumatica ([place]) 07/13/2013   Daily prednisone  . Restless leg syndrome   . Retinal disease    right eye since birth     . Shingles   . Temporal arteritis ([place]) 10/2015  . Wears glasses   . 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    Patient Active Problem List   Diagnosis Date Noted  . 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 Loculated pleural effusion 11/24/2019  . Chest tube in place 10/07/2019  . Left inguinal hernia 01/27/2019  . 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  . Abnormal nuclear cardiac imaging test 01/31/2018  . Allergic rhinitis 01/21/2018  . Arthritis, degenerative 01/21/2018  . Benign fibroma of prostate 01/21/2018  . Blood glucose elevated 01/21/2018  . Restless leg 01/21/2018  . Pure hypercholesterolemia 01/21/2018  . Lung nodule, solitary 01/21/2018  . Temporal arteritis ([place]) 01/21/2018  . Temporary cerebral vascular dysfunction 01/21/2018  . Thoracic, thoracolumbar and lumbosacral intervertebral disc disorder 01/21/2018  . Bilateral pleural effusion 12/08/2017  . Pedal edema- chronic 12/08/2017  . Yellow nails 12/08/2017  . Cough in adult 12/07/2017  . Recurrent pleural effusion on right 12/06/2017  . Hyponatremia 11/22/2017  . Esophageal reflux 04/18/2016  . Pneumonia 02/22/2016  . Nail fungus 11/09/2015  . Optic neuritis 11/08/2015  . Chronic sinusitis 06/22/2015  . Perirectal abscess 12/25/2013  . Coronary artery calcification 08/18/2013  . Shortness of breath 07/13/2013  . History of TIA (transient ischemic attack) 07/13/2013  . Edema 07/13/2013  . Aortic atherosclerosis (  [place]) 07/13/2013    Past Surgical History:  Procedure Laterality Date  . BRAVO [place] STUDY N/A 04/18/2016   Procedure: BRAVO [place];  Surgeon: Wilford Corner, MD;  Location: [facility];  Service: Endoscopy;  Laterality: N/A;  . CHEST TUBE INSERTION Right 02/13/2018   Procedure: INSERTION PLEURAL DRAINAGE CATHETER;  Surgeon: Ivin Poot, MD;  Location: [facility];  Service: Thoracic;  Laterality: Right;  . CHEST TUBE INSERTION Left 11/27/2019   Procedure: REDO INSERTION PLEURAL DRAINAGE CATHETER USING 15.5 FR. [place] CATH;   Surgeon: [name], Collier [place]ina, MD;  Location: [facility];  Service: Thoracic;  Laterality: Left;  . COLONOSCOPY    . drropy eyelid surgery    . ESOPHAGOGASTRODUODENOSCOPY (EGD) WITH PROPOFOL N/A 04/18/2016   Procedure: ESOPHAGOGASTRODUODENOSCOPY (EGD) WITH PROPOFOL;  Surgeon: Wilford Corner, MD;  Location: [facility];  Service: Endoscopy;  Laterality: N/A;  . INGUINAL HERNIA REPAIR Left 01/27/2019   Procedure: OPEN REPAIR LEFT INGUINAL HERNIA WITH MESH;  Surgeon: Fanny Skates, MD;  Location: [facility];  Service: General;  Laterality: Left;  GENERAL AND TAP BLOCK ANESTHESIA  . INSERTION OF MESH Left 01/27/2019   Procedure: Insertion Of Mesh;  Surgeon: Fanny Skates, MD;  Location: [facility];  Service: General;  Laterality: Left;  . IR THORACENTESIS ASP PLEURAL SPACE W/IMG GUIDE  02/04/2018  . IR THORACENTESIS ASP PLEURAL SPACE W/IMG GUIDE  02/06/2018  . REMOVAL OF PLEURAL DRAINAGE CATHETER Left 11/27/2019   Procedure: REMOVAL OF PLEURAL DRAINAGE CATHETER;  Surgeon: Ivin Poot, MD;  Location: [facility];  Service: Thoracic;  Laterality: Left;  . RIGHT/LEFT HEART CATH AND CORONARY ANGIOGRAPHY N/A 06/24/2018   Procedure: RIGHT/LEFT HEART CATH AND CORONARY ANGIOGRAPHY;  Surgeon: Jolaine Artist, MD;  Location: [facility];  Service: Cardiovascular;  Laterality: N/A;  . VASECTOMY         Family History  Problem Relation Age of Onset  . Stroke Mother   . Coronary artery disease Mother   . Diabetes Mother   . Breast cancer Mother   . Heart attack Father   . Coronary artery disease Father   . Diabetes Father   . Graves' disease Sister     Social History   Tobacco Use  . Smoking status: Former Smoker    Packs/day: 1.00    Years: 15.00    Pack years: 15.00    Types: Cigarettes    Quit date: 07/23/1975    Years since quitting: 44.4  . Smokeless tobacco: Never Used  Substance Use Topics  . Alcohol use: Yes    Alcohol/week: 7.0 standard drinks    Types: 7 Glasses of wine per week     Comment: 1 glass red wine daily  . Drug use: No    Home Medications Prior to Admission medications   Medication Sig Start Date End Date Taking? Authorizing Provider  acetaminophen (TYLENOL) 500 MG tablet Take 500 mg by mouth daily as needed for moderate pain or headache.    [provider]  calcium carbonate (TUMS - DOSED IN MG ELEMENTAL CALCIUM) 500 MG chewable tablet Chew 1 tablet by mouth daily as needed for indigestion.    [provider]  cetirizine (ZYRTEC) 10 MG tablet Take 10 mg by mouth daily. As needed for allergies    [provider]  doxazosin (CARDURA) 8 MG tablet Take 8 mg by mouth daily.    [provider]  Omega-3 Fatty Acids (FISH OIL PO) Take 2 capsules by mouth daily.  [provider]  pantoprazole (PROTONIX) 20 MG tablet Take 20 mg by mouth daily.     [provider]  Polyethyl Glycol-Propyl Glycol (SYSTANE OP) Place 1 drop into both eyes 2 (two) times daily as needed (dry eyes).    [provider]  potassium chloride SA (KLOR-CON) 20 MEQ tablet Take 3 tablets (60 mEq total) by mouth 2 (two) times daily. 08/31/19   Bensimhon, Shaune Pascal, MD  simvastatin (ZOCOR) 20 MG tablet Take 20 mg by mouth daily.    [provider]  torsemide (DEMADEX) 20 MG tablet Take 3 tablets (60 mg total) by mouth every morning AND 2 tablets (40 mg total) every evening. 08/31/19   Bensimhon, Shaune Pascal, MD  vitamin C (ASCORBIC ACID) 250 MG tablet Take 250 mg by mouth daily.    [provider]    Allergies    Patient has no known allergies.  Review of Systems   Review of Systems  Constitutional: Negative for fever.  HENT: Negative for sore throat.   Eyes: Negative for visual disturbance.  Respiratory: Positive for shortness of breath. Negative for cough and hemoptysis.   Cardiovascular: Positive for chest pain.  Gastrointestinal: Negative for abdominal pain.  Genitourinary: Negative for dysuria.  Musculoskeletal:  Negative for neck pain.  Skin: Negative for rash.  Neurological: Negative for headaches.    Physical Exam Updated Vital Signs BP (!) 114/57 (BP Location: Right Arm)   Pulse 78   Temp (!) 97.5 F (36.4 C) (Oral)   Resp 14   Ht 5\' 6"  (1.676 m)   Wt 64.9 kg   SpO2 99%   BMI 23.08 kg/m   Physical Exam Vitals and nursing note reviewed.  Constitutional:      Appearance: He is well-developed.  HENT:     Head: Normocephalic and atraumatic.  Eyes:     Conjunctiva/sclera: Conjunctivae normal.  Cardiovascular:     Rate and Rhythm: Normal rate and regular rhythm.     Heart sounds: No murmur.  Pulmonary:     Effort: Pulmonary effort is normal. No respiratory distress.     Breath sounds: Examination of the left-middle field reveals decreased breath sounds. Examination of the left-lower field reveals decreased breath sounds. Decreased breath sounds present.  Abdominal:     Palpations: Abdomen is soft.     Tenderness: There is no abdominal tenderness.  Musculoskeletal:        General: No deformity or signs of injury. Normal range of motion.     Cervical back: Neck supple.  Skin:    General: Skin is warm and dry.     Capillary Refill: Capillary refill takes less than 2 seconds.  Neurological:     General: No focal deficit present.     Mental Status: He is alert.     ED Results / Procedures / Treatments   Labs (all labs ordered are listed, but only abnormal results are displayed) Labs Reviewed - No data to display  EKG None  Radiology DG Chest 2 View  Result Date: 12/07/2019 CLINICAL DATA:  Pleural catheter drainage EXAM: CHEST - 2 VIEW COMPARISON:  Nov 27, 2019 FINDINGS: Chest tube is present on the left with the tip anterior and medial in the left hemithorax. There is also a PleurX drainage catheter on the right inferiorly. The previously noted pneumothorax on the left has nearly completely resolved, although there is apparent small loculated pneumothorax on the left  posteriorly and inferiorly. Scattered air-fluid levels elsewhere potentially could represent foci  of infection. There is moderate partially loculated effusion on the left. On the right, there is minimal pleural effusion. Right lung otherwise clear. Heart size normal. Pulmonary vascularity within normal limits. No adenopathy evident. IMPRESSION: Partially loculated pleural effusion on the left. Air-fluid levels on the left with probable small loculated pneumothorax inferiorly and posteriorly on the left with other areas of questionable loculated air versus residual infection. Minimal pleural effusion on the right with PleurX catheter on the right. No edema or frank consolidation noted. Note that pneumothorax on the left seen on most recent study has virtually completely resolved. Stable cardiac silhouette. Electronically Signed   By: Lowella Grip III M.D.   On: 12/07/2019 10:43    Procedures Procedures (including critical care time)  Medications Ordered in ED Medications - No data to display  ED Course  I have reviewed the triage vital signs and the nursing notes.  Pertinent labs & imaging results that were available during my care of the patient were reviewed by me and considered in my medical decision making (see chart for details).  Clinical Course as of Dec 07 1754  Mon Dec 07, 2019  1514 CT surgery PA Gold came down to see the patient.  He felt he can be safely discharged and follow-up on Wednesday with his regular appointment.   [MB]    Clinical Course User Index [MB] Hayden Rasmussen, MD   MDM Rules/Calculators/A&P                     Differential diagnosis includes malpositioned chest tube, hemothorax, pneumonia, coagulopathy Final Clinical Impression(s) / ED Diagnoses Final diagnoses:  Complication of chest tube, initial encounter  Loculated pleural effusion    Rx / DC Orders ED Discharge Orders    None       Hayden Rasmussen, MD 12/07/19 1757

## 2019-12-07 NOTE — ED Notes (Signed)
Thoracic surgery at bedside.

## 2019-12-08 ENCOUNTER — Other Ambulatory Visit: Payer: Self-pay | Admitting: Cardiothoracic Surgery

## 2019-12-08 DIAGNOSIS — J90 Pleural effusion, not elsewhere classified: Secondary | ICD-10-CM

## 2019-12-09 ENCOUNTER — Other Ambulatory Visit: Payer: Self-pay | Admitting: *Deleted

## 2019-12-09 ENCOUNTER — Ambulatory Visit (INDEPENDENT_AMBULATORY_CARE_PROVIDER_SITE_OTHER): Payer: Medicare Other | Admitting: Cardiothoracic Surgery

## 2019-12-09 ENCOUNTER — Other Ambulatory Visit: Payer: Self-pay

## 2019-12-09 ENCOUNTER — Encounter: Payer: Self-pay | Admitting: Cardiothoracic Surgery

## 2019-12-09 ENCOUNTER — Other Ambulatory Visit: Payer: Self-pay | Admitting: Cardiothoracic Surgery

## 2019-12-09 ENCOUNTER — Ambulatory Visit
Admission: RE | Admit: 2019-12-09 | Discharge: 2019-12-09 | Disposition: A | Discharge status: Home / Self Care | Payer: Medicare Other | Source: Ambulatory Visit | Attending: Cardiothoracic Surgery | Admitting: Cardiothoracic Surgery

## 2019-12-09 VITALS — BP 110/65 | HR 73 | Temp 97.7°F | Resp 16 | Ht 66.0 in | Wt 144.0 lb

## 2019-12-09 DIAGNOSIS — R899 Unspecified abnormal finding in specimens from other organs, systems and tissues: Secondary | ICD-10-CM | POA: Diagnosis not present

## 2019-12-09 DIAGNOSIS — Z9689 Presence of other specified functional implants: Secondary | ICD-10-CM

## 2019-12-09 DIAGNOSIS — J90 Pleural effusion, not elsewhere classified: Secondary | ICD-10-CM

## 2019-12-09 DIAGNOSIS — J942 Hemothorax: Secondary | ICD-10-CM

## 2019-12-09 DIAGNOSIS — I2510 Atherosclerotic heart disease of native coronary artery without angina pectoris: Secondary | ICD-10-CM

## 2019-12-09 DIAGNOSIS — R0602 Shortness of breath: Secondary | ICD-10-CM | POA: Diagnosis not present

## 2019-12-09 DIAGNOSIS — E039 Hypothyroidism, unspecified: Secondary | ICD-10-CM | POA: Diagnosis not present

## 2019-12-09 DIAGNOSIS — J918 Pleural effusion in other conditions classified elsewhere: Secondary | ICD-10-CM | POA: Diagnosis not present

## 2019-12-09 DIAGNOSIS — I2584 Coronary atherosclerosis due to calcified coronary lesion: Secondary | ICD-10-CM

## 2019-12-09 NOTE — Progress Notes (Signed)
PCP is Wenda Low, MD Referring Provider is Wenda Low, MD  Chief Complaint  Patient presents with  . Routine Post Op    2 wk f/u after reinsertion of L pleureX.Marland KitchenMarland KitchenCXR    HPI: Postop visit after exchange of left Pleurx catheter. 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 He developed bloody left pleural effusion followed by occlusion of the catheter and development of a large serosanguineous pleural effusion. The Plavix has been stopped and he remains off. He has some dyspnea on exertion since the catheter exchange. Chest x-ray today shows the new catheter in good position with small loculated left pleural effusion.  Getting the new catheter in place was difficult due to loculated fluid. On the right side there is minimal effusion and catheter is in good position.  Today I removed some sutures from the new catheter insertion and the old catheter removal. I flushed the new catheter with sterile saline to keep it from clogging and sent some of the pleural fluid for culture. We will stop the omega-3 fish oil he is taking--the strategy is to decrease bleeding in the left pleural space so that the new catheter will not clotted off. Patient return in 2 weeks with CT scan of the chest to assess the left pleural space and to remove the remaining skin sutures. He will reduce drainage of the left side to Mondays and Fridays since the last 2 drainage sessions only removed 100 cc.   Past Medical History:  Diagnosis Date  . Actinic keratoses   . Arthritis   . Chronic bilateral pleural effusions   . Chronic sinusitis   . Colon polyps   . Dyspnea 07/13/2013   BNP normal  . Edema 07/13/2013  . GERD (gastroesophageal reflux disease)    omeprazole 1-2 times daily  . H/O TIA (transient ischemic attack) and stroke    on statins and plavix -saw Dr.Sethi in the past  . Headache   . History of BPH   . History of TIA (transient ischemic  attack) 07/13/2013  . Left inguinal hernia   . Left inguinal hernia 01/27/2019  . Lumbar radiculopathy   . Need for shingles vaccine   . Optic neuritis 10/2015   left eye  . Perennial allergic rhinitis   . Polymyalgia rheumatica ([place]) 07/13/2013   Daily prednisone  . Restless leg syndrome   . Retinal disease    right eye since birth   . Shingles   . Temporal arteritis ([place]) 10/2015  . Wears glasses   . 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 BRAVO [place] STUDY N/A 04/18/2016   Procedure: BRAVO [place];  Surgeon: Wilford Corner, MD;  [facility];  Service: Endoscopy;  Laterality: N/A;  . CHEST TUBE INSERTION Right 02/13/2018   Procedure: INSERTION PLEURAL DRAINAGE CATHETER;  Surgeon: Ivin Poot, MD;  Location: [place];  Service: Thoracic;  Laterality: Right;  . CHEST TUBE INSERTION Left 11/27/2019   Procedure: REDO INSERTION PLEURAL DRAINAGE CATHETER USING 15.5 FR. [place] CATH;  Surgeon: [name], Collier Salina, MD;  Location: [place];  Service: Thoracic;  Laterality: Left;  . COLONOSCOPY    . drropy eyelid surgery    . ESOPHAGOGASTRODUODENOSCOPY (EGD) WITH PROPOFOL N/A 04/18/2016   Procedure: ESOPHAGOGASTRODUODENOSCOPY (EGD) WITH PROPOFOL;  Surgeon: Wilford Corner, MD;  [facility];  Service: Endoscopy;  Laterality: N/A;  . INGUINAL HERNIA REPAIR Left 01/27/2019   Procedure: OPEN  REPAIR LEFT INGUINAL HERNIA WITH MESH;  Surgeon: Fanny Skates, MD;  Location: [place];  Service: General;  Laterality: Left;  GENERAL AND TAP BLOCK ANESTHESIA  . INSERTION OF MESH Left 01/27/2019   Procedure: Insertion Of Mesh;  Surgeon: Fanny Skates, MD;  Location: [place];  Service: General;  Laterality: Left;  . IR THORACENTESIS ASP PLEURAL SPACE W/IMG GUIDE  02/04/2018  . IR THORACENTESIS ASP PLEURAL SPACE W/IMG GUIDE  02/06/2018  . REMOVAL OF PLEURAL DRAINAGE CATHETER Left 11/27/2019   Procedure: REMOVAL OF PLEURAL DRAINAGE CATHETER;  Surgeon: Ivin Poot, MD;  Location: [place];  Service: Thoracic;  Laterality: Left;  . RIGHT/LEFT HEART CATH AND CORONARY ANGIOGRAPHY N/A 06/24/2018   Procedure: RIGHT/LEFT HEART CATH AND CORONARY ANGIOGRAPHY;  Surgeon: Jolaine Artist, MD;  Location: [place] [facility];  Service: Cardiovascular;  Laterality: N/A;  . VASECTOMY      Family History  Problem Relation Age of Onset  . Stroke Mother   . Coronary artery disease Mother   . Diabetes Mother   . Breast cancer Mother   . Heart attack Father   . Coronary artery disease Father   . Diabetes Father   . Graves' disease Sister     Social History Social History   Tobacco Use  . Smoking status: Former Smoker    Packs/day: 1.00    Years: 15.00    Pack years: 15.00    Types: Cigarettes    Quit date: 07/23/1975    Years since quitting: 44.4  . Smokeless tobacco: Never Used  Substance Use Topics  . Alcohol use: Yes    Alcohol/week: 7.0 standard drinks    Types: 7 Glasses of wine per week    Comment: 1 glass red wine daily  . Drug use: No    Current Outpatient Medications  Medication Sig Dispense Refill  . acetaminophen (TYLENOL) 500 MG tablet Take 500 mg by mouth daily as needed for moderate pain or headache.    . calcium carbonate (TUMS - DOSED IN MG ELEMENTAL CALCIUM) 500 MG chewable tablet Chew 1 tablet by mouth daily as needed for indigestion.    . cetirizine (ZYRTEC) 10 MG tablet Take 10 mg by mouth daily. As needed for allergies    . doxazosin (CARDURA) 8 MG tablet Take 8 mg by mouth daily.    . Omega-3 Fatty Acids (FISH OIL PO) Take 2 capsules by mouth daily.    . pantoprazole (PROTONIX) 20 MG tablet Take 20 mg by mouth daily.     Vladimir Faster Glycol-Propyl Glycol (SYSTANE OP) Place 1 drop into both eyes 2 (two) times daily as needed (dry eyes).    . potassium chloride SA (KLOR-CON) 20 MEQ tablet Take 3 tablets (60 mEq total) by mouth 2 (two) times daily. 540 tablet 3  . simvastatin (ZOCOR) 20 MG tablet Take 20 mg by mouth  daily.    Marland Kitchen torsemide (DEMADEX) 20 MG tablet Take 3 tablets (60 mg total) by mouth every morning AND 2 tablets (40 mg total) every evening. 450 tablet 3  . vitamin C (ASCORBIC ACID) 250 MG tablet Take 250 mg by mouth daily.     No current facility-administered medications for this visit.    No Known Allergies  Review of Systems  Some shortness of breath with activity Longstanding unchanged lower extremity edema Nonproductive cough, mild No chest pain No fever Weight stable Stable pretibial edema bilaterally No change in vision or dizziness  BP 110/65 (BP Location: Left Arm,  Patient Position: Sitting, Cuff Size: Normal)   Pulse 73   Temp 97.7 F (36.5 C)   Resp 16   Ht 5\' 6"  (1.676 m)   Wt 144 lb (65.3 kg)   SpO2 94% Comment: RA  BMI 23.24 kg/m  Physical Exam      Exam    General- alert and comfortable    Neck- no JVD, no cervical adenopathy palpable, no carotid bruit   Lungs- clear without rales, wheezes.  Left catheter exit site sutures removed, healing well.  Incisions from new catheter insertion site healing well.   Cor- regular rate and rhythm, no murmur , gallop   Abdomen- soft, non-tender   Extremities - warm, non-tender, minimal edema   Neuro- oriented, appropriate, no focal weakness   Diagnostic Tests: Chest x-ray performed today personally reviewed and discussed with patient showing catheter in good position on the left and a small loculated lateral effusion  Impression: Persistent bilateral pleural effusions related to yellow nail syndrome with bilateral pleural catheters  Plan: Return in 2 weeks to remove remaining sutures and to check CT scan of chest to assess etiology of play pleural fluid. Left pleural fluid removed today and sent for cultures.  May send pleural fluid for cytology at next visit if pleural fluid remains bloody. Certainly some of the blood in his left chest is related to removal of his old Pleurx catheter recently.   Len Childs, MD [facility] [phone]

## 2019-12-15 ENCOUNTER — Ambulatory Visit
Admission: RE | Admit: 2019-12-15 | Discharge: 2019-12-15 | Disposition: A | Discharge status: Home / Self Care | Payer: Medicare Other | Source: Ambulatory Visit | Attending: Cardiothoracic Surgery | Admitting: Cardiothoracic Surgery

## 2019-12-15 DIAGNOSIS — J948 Other specified pleural conditions: Secondary | ICD-10-CM | POA: Diagnosis not present

## 2019-12-15 DIAGNOSIS — J90 Pleural effusion, not elsewhere classified: Secondary | ICD-10-CM | POA: Diagnosis not present

## 2019-12-15 LAB — ANAEROBIC AND AEROBIC CULTURE
AER RESULT:: NO GROWTH
MICRO NUMBER:: 10496069
MICRO NUMBER:: 10496070
SPECIMEN QUALITY:: ADEQUATE
SPECIMEN QUALITY:: ADEQUATE

## 2019-12-15 MED ORDER — IOPAMIDOL (ISOVUE-300) INJECTION 61%
75.0000 mL | Freq: Once | INTRAVENOUS | Status: AC | PRN
  Administered 2019-12-15: 75 mL via INTRAVENOUS

## 2019-12-23 ENCOUNTER — Ambulatory Visit: Payer: Medicare Other | Admitting: Cardiothoracic Surgery

## 2019-12-24 ENCOUNTER — Encounter: Payer: Self-pay | Admitting: Cardiothoracic Surgery

## 2019-12-24 ENCOUNTER — Other Ambulatory Visit: Payer: Self-pay

## 2019-12-24 ENCOUNTER — Ambulatory Visit (INDEPENDENT_AMBULATORY_CARE_PROVIDER_SITE_OTHER): Payer: Medicare Other | Admitting: Cardiothoracic Surgery

## 2019-12-24 VITALS — BP 126/62 | HR 72 | Temp 97.9°F | Resp 20 | Ht 66.0 in | Wt 152.0 lb

## 2019-12-24 DIAGNOSIS — I2584 Coronary atherosclerosis due to calcified coronary lesion: Secondary | ICD-10-CM | POA: Diagnosis not present

## 2019-12-24 DIAGNOSIS — Z4889 Encounter for other specified surgical aftercare: Secondary | ICD-10-CM

## 2019-12-24 DIAGNOSIS — I2510 Atherosclerotic heart disease of native coronary artery without angina pectoris: Secondary | ICD-10-CM | POA: Diagnosis not present

## 2019-12-24 DIAGNOSIS — J90 Pleural effusion, not elsewhere classified: Secondary | ICD-10-CM

## 2019-12-24 NOTE — Progress Notes (Signed)
PCP is Wenda Low, MD Referring Provider is Wenda Low, MD  Chief Complaint  Patient presents with  . Pleural Effusion    2 week f/u with Chest CT 12/15/19    HPI: Patient examined and recent CT scan images personally reviewed.  Patient has had bilateral Pleurx catheters to drain recurrent effusions for a few years.  He has underlying connective tissue disease called yellow nail syndrome.  His left Pleurx catheter needed to be replaced because of nonfunction.  Initially the new catheter drained fairly well but now it has stopped draining and a CT scan this week shows a fairly significant left effusion.  I had difficulty placing the last catheter because of adhesions and probable loculations.  Patient feels somewhat more short of breath but is still able to do his daily activities at age 86.  I have discussed having interventional radiology evaluate the patient for CT-guided Pleurx catheter due to the loculated nature of the fluid and the patient is agreeable.  The right sided Pleurx catheter is working well. Past Medical History:  Diagnosis Date  . Actinic keratoses   . Arthritis   . Chronic bilateral pleural effusions   . Chronic sinusitis   . Colon polyps   . Dyspnea 07/13/2013   BNP normal  . Edema 07/13/2013  . GERD (gastroesophageal reflux disease)    omeprazole 1-2 times daily  . H/O TIA (transient ischemic attack) and stroke    on statins and plavix -saw Dr.Sethi in the past  . Headache   . History of BPH   . History of TIA (transient ischemic attack) 07/13/2013  . Left inguinal hernia   . Left inguinal hernia 01/27/2019  . Lumbar radiculopathy   . Need for shingles vaccine   . Optic neuritis 10/2015   left eye  . Perennial allergic rhinitis   . Polymyalgia rheumatica ([place]) 07/13/2013   Daily prednisone  . Restless leg syndrome   . Retinal disease    right eye since birth   . Shingles   . Temporal arteritis ([place]) 10/2015  . Wears glasses   . 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 BRAVO [place] STUDY N/A 04/18/2016   Procedure: BRAVO [place];  Surgeon: Wilford Corner, MD;  [facility];  Service: Endoscopy;  Laterality: N/A;  . CHEST TUBE INSERTION Right 02/13/2018   Procedure: INSERTION PLEURAL DRAINAGE CATHETER;  Surgeon: Ivin Poot, MD;  Location: [place];  Service: Thoracic;  Laterality: Right;  . CHEST TUBE INSERTION Left 11/27/2019   Procedure: REDO INSERTION PLEURAL DRAINAGE CATHETER USING 15.5 FR. [place] CATH;  Surgeon: [name], Collier Salina, MD;  Location: [place];  Service: Thoracic;  Laterality: Left;  . COLONOSCOPY    . drropy eyelid surgery    . ESOPHAGOGASTRODUODENOSCOPY (EGD) WITH PROPOFOL N/A 04/18/2016   Procedure: ESOPHAGOGASTRODUODENOSCOPY (EGD) WITH PROPOFOL;  Surgeon: Wilford Corner, MD;  [facility];  Service: Endoscopy;  Laterality: N/A;  . INGUINAL HERNIA REPAIR Left 01/27/2019   Procedure: OPEN REPAIR LEFT INGUINAL HERNIA WITH MESH;  Surgeon: Fanny Skates, MD;  Location: [place];  Service: General;  Laterality: Left;  GENERAL AND TAP BLOCK ANESTHESIA  . INSERTION OF MESH Left 01/27/2019   Procedure: Insertion Of Mesh;  Surgeon: Fanny Skates, MD;  Location: [place];  Service: General;  Laterality: Left;  . IR THORACENTESIS ASP PLEURAL SPACE W/IMG GUIDE  02/04/2018  . IR THORACENTESIS ASP PLEURAL SPACE W/IMG GUIDE  02/06/2018  .  REMOVAL OF PLEURAL DRAINAGE CATHETER Left 11/27/2019   Procedure: REMOVAL OF PLEURAL DRAINAGE CATHETER;  Surgeon: Ivin Poot, MD;  Location: [place];  Service: Thoracic;  Laterality: Left;  . RIGHT/LEFT HEART CATH AND CORONARY ANGIOGRAPHY N/A 06/24/2018   Procedure: RIGHT/LEFT HEART CATH AND CORONARY ANGIOGRAPHY;  Surgeon: Jolaine Artist, MD;  Location: [place] [facility];  Service: Cardiovascular;  Laterality: N/A;  . VASECTOMY      Family History  Problem Relation Age of Onset  . Stroke Mother   . Coronary artery disease  Mother   . Diabetes Mother   . Breast cancer Mother   . Heart attack Father   . Coronary artery disease Father   . Diabetes Father   . Graves' disease Sister     Social History Social History   Tobacco Use  . Smoking status: Former Smoker    Packs/day: 1.00    Years: 15.00    Pack years: 15.00    Types: Cigarettes    Quit date: 07/23/1975    Years since quitting: 44.4  . Smokeless tobacco: Never Used  Substance Use Topics  . Alcohol use: Yes    Alcohol/week: 7.0 standard drinks    Types: 7 Glasses of wine per week    Comment: 1 glass red wine daily  . Drug use: No    Current Outpatient Medications  Medication Sig Dispense Refill  . acetaminophen (TYLENOL) 500 MG tablet Take 500 mg by mouth daily as needed for moderate pain or headache.    Marland Kitchen aspirin EC 81 MG tablet Take 81 mg by mouth daily.    . calcium carbonate (TUMS - DOSED IN MG ELEMENTAL CALCIUM) 500 MG chewable tablet Chew 1 tablet by mouth daily as needed for indigestion.    . cetirizine (ZYRTEC) 10 MG tablet Take 10 mg by mouth daily. As needed for allergies    . doxazosin (CARDURA) 8 MG tablet Take 8 mg by mouth daily.    Marland Kitchen levothyroxine (SYNTHROID) 200 MCG tablet Take 200 mcg by mouth daily before breakfast.    . pantoprazole (PROTONIX) 20 MG tablet Take 20 mg by mouth daily.     Vladimir Faster Glycol-Propyl Glycol (SYSTANE OP) Place 1 drop into both eyes 2 (two) times daily as needed (dry eyes).    . potassium chloride SA (KLOR-CON) 20 MEQ tablet Take 3 tablets (60 mEq total) by mouth 2 (two) times daily. 540 tablet 3  . simvastatin (ZOCOR) 20 MG tablet Take 20 mg by mouth daily.    Marland Kitchen torsemide (DEMADEX) 20 MG tablet Take 3 tablets (60 mg total) by mouth every morning AND 2 tablets (40 mg total) every evening. 450 tablet 3  . vitamin C (ASCORBIC ACID) 250 MG tablet Take 250 mg by mouth daily.     No current facility-administered medications for this visit.    No Known Allergies  Review of Systems   Nonproductive cough No symptoms of COVID-19-fever chest pain loss of taste or smell or fatigue No dizziness or syncope Difficulty swallowing Increasing ankle edema No change in bowel habits  BP 126/62   Pulse 72   Temp 97.9 F (36.6 C) (Skin)   Resp 20   Ht 5\' 6"  (1.676 m)   Wt 152 lb (68.9 kg)   SpO2 95% Comment: RA  BMI 24.53 kg/m  Physical Exam      Exam    General- alert and comfortable    Neck- no JVD, no cervical adenopathy palpable, no carotid bruit  Lungs- clear without rales on the right, diminished breath sounds at left base    Cor- regular rate and rhythm, no murmur , gallop   Abdomen- soft, non-tender   Extremities - warm, non-tender, minimal edema   Neuro- oriented, appropriate, no focal weakness   Diagnostic Tests: Images of CT scan personally reviewed and discussed with patient and wife  Impression: Recurrent partially loculated left benign pleural effusion with unsuccessful drainage after insertion of a replacement Pleurx catheter.  We will ask interventional radiology to assess for CT-guided left Pleurx catheter.  Plan: Return in 6 weeks for review of Pleurx catheters   Len Childs, MD [facility] [phone]

## 2019-12-29 ENCOUNTER — Telehealth (HOSPITAL_COMMUNITY): Payer: Self-pay

## 2019-12-29 ENCOUNTER — Other Ambulatory Visit (HOSPITAL_COMMUNITY): Payer: Self-pay | Admitting: Cardiothoracic Surgery

## 2019-12-29 DIAGNOSIS — J90 Pleural effusion, not elsewhere classified: Secondary | ICD-10-CM

## 2019-12-29 NOTE — Telephone Encounter (Signed)
-----   Message from Arne Cleveland, MD sent at 11/25/2019  2:47 PM EDT ----- Regarding: RE: [place]  CT guided pleurX placement LEFT   DDH   ----- Message ----- From: Danielle Dess Sent: 11/25/2019  12:43 PM EDT To: Ir Procedure Requests Subject: L Pleurx Catheter                              Procedure: L Pleurx Catheter Placement  Dx: L Pleural Effusion  Ordering: Dr. Tharon Aquas Trigt [phone])  Imaging: DG chest done 11/24/19  Please review. Dr. [name] also has a recent note in epic regarding reasoning for L Pleurx placement.  Thanks, Lia Foyer

## 2019-12-30 ENCOUNTER — Ambulatory Visit (HOSPITAL_COMMUNITY)
Admission: RE | Admit: 2019-12-30 | Discharge: 2019-12-30 | Disposition: A | Discharge status: Home / Self Care | Payer: Medicare Other | Source: Ambulatory Visit | Attending: Internal Medicine | Admitting: Internal Medicine

## 2019-12-30 ENCOUNTER — Telehealth (HOSPITAL_COMMUNITY): Payer: Self-pay

## 2019-12-30 ENCOUNTER — Encounter (HOSPITAL_COMMUNITY): Payer: Self-pay | Admitting: Internal Medicine

## 2019-12-30 ENCOUNTER — Other Ambulatory Visit: Payer: Self-pay

## 2019-12-30 VITALS — BP 110/54 | HR 84 | Ht 66.0 in | Wt 152.8 lb

## 2019-12-30 DIAGNOSIS — I5032 Chronic diastolic (congestive) heart failure: Secondary | ICD-10-CM | POA: Diagnosis not present

## 2019-12-30 DIAGNOSIS — G2581 Restless legs syndrome: Secondary | ICD-10-CM | POA: Diagnosis not present

## 2019-12-30 DIAGNOSIS — L605 Yellow nail syndrome: Secondary | ICD-10-CM | POA: Diagnosis not present

## 2019-12-30 DIAGNOSIS — K219 Gastro-esophageal reflux disease without esophagitis: Secondary | ICD-10-CM | POA: Diagnosis not present

## 2019-12-30 DIAGNOSIS — Z8673 Personal history of transient ischemic attack (TIA), and cerebral infarction without residual deficits: Secondary | ICD-10-CM | POA: Diagnosis not present

## 2019-12-30 DIAGNOSIS — Z7982 Long term (current) use of aspirin: Secondary | ICD-10-CM | POA: Diagnosis not present

## 2019-12-30 DIAGNOSIS — J90 Pleural effusion, not elsewhere classified: Secondary | ICD-10-CM | POA: Insufficient documentation

## 2019-12-30 DIAGNOSIS — Z4682 Encounter for fitting and adjustment of non-vascular catheter: Secondary | ICD-10-CM | POA: Diagnosis not present

## 2019-12-30 DIAGNOSIS — Z79899 Other long term (current) drug therapy: Secondary | ICD-10-CM | POA: Insufficient documentation

## 2019-12-30 DIAGNOSIS — Z7952 Long term (current) use of systemic steroids: Secondary | ICD-10-CM | POA: Insufficient documentation

## 2019-12-30 DIAGNOSIS — Z87891 Personal history of nicotine dependence: Secondary | ICD-10-CM | POA: Insufficient documentation

## 2019-12-30 DIAGNOSIS — M353 Polymyalgia rheumatica: Secondary | ICD-10-CM | POA: Insufficient documentation

## 2019-12-30 DIAGNOSIS — I2510 Atherosclerotic heart disease of native coronary artery without angina pectoris: Secondary | ICD-10-CM | POA: Diagnosis not present

## 2019-12-30 LAB — COMPREHENSIVE METABOLIC PANEL
ALT: 9 U/L (ref 0–44)
AST: 17 U/L (ref 15–41)
Albumin: 2.7 g/dL — ABNORMAL LOW (ref 3.5–5.0)
Alkaline Phosphatase: 55 U/L (ref 38–126)
Anion gap: 12 (ref 5–15)
BUN: 25 mg/dL — ABNORMAL HIGH (ref 8–23)
CO2: 26 mmol/L (ref 22–32)
Calcium: 8.2 mg/dL — ABNORMAL LOW (ref 8.9–10.3)
Chloride: 103 mmol/L (ref 98–111)
Creatinine, Ser: 1.14 mg/dL (ref 0.61–1.24)
GFR calc Af Amer: >60 mL/min (ref >60)
GFR calc non Af Amer: 58 mL/min — ABNORMAL LOW (ref >60)
Glucose, Bld: 115 mg/dL — ABNORMAL HIGH (ref 70–99)
Potassium: 4.2 mmol/L (ref 3.5–5.1)
Sodium: 141 mmol/L (ref 135–145)
Total Bilirubin: 0.4 mg/dL (ref 0.3–1.2)
Total Protein: 6.1 g/dL — ABNORMAL LOW (ref 6.5–8.1)

## 2019-12-30 LAB — BRAIN NATRIURETIC PEPTIDE: B Natriuretic Peptide: 35.7 pg/mL (ref 0.0–100.0)

## 2019-12-30 NOTE — Patient Instructions (Signed)
Labs done today, your results will be available in MyChart, we will contact you for abnormal readings.  Chest X-ray today  Please call our office in October to schedule your follow up appointment  If you have any questions or concerns before your next appointment please send [place] a message through [place] or call our office at [phone].    TO LEAVE A MESSAGE FOR THE NURSE SELECT OPTION 2, PLEASE LEAVE A MESSAGE INCLUDING: . YOUR NAME . DATE OF BIRTH . CALL BACK NUMBER . REASON FOR CALL**this is important as we prioritize the call backs  [place] AS LONG AS YOU CALL BEFORE 4:00 PM  At the [facility], you and your health needs are our priority. As part of our continuing mission to provide you with exceptional heart care, we have created designated Provider Care Teams. These Care Teams include your primary Cardiologist (physician) and Advanced Practice Providers (APPs- Physician Assistants and Nurse Practitioners) who all work together to provide you with the care you need, when you need it.   You may see any of the following providers on your designated Care Team at your next follow up: Marland Kitchen Dr Glori Bickers . Dr Loralie Champagne . Darrick Grinder, NP . Lyda Jester, PA . Audry Riles, PharmD   Please be sure to bring in all your medications bottles to every appointment.

## 2019-12-30 NOTE — Addendum Note (Signed)
Encounter addended by: Scarlette Calico, RN on: 12/30/2019 2:24 PM  Actions taken: Order list changed, Diagnosis association updated, Clinical Note Signed, Charge Capture section accepted

## 2019-12-30 NOTE — Progress Notes (Signed)
Heart Failure Clinic Note   Date:  12/30/2019   [name], DOB February 20, 1932, MRN [id number]  Location: Home  Provider location: [facility] Type of Visit: Established patient, Add on  PCP:  Stuart Low, MD  Primary HF: Stuart Watson  Chief Complaint: Fall   History of Present Illness:  Stuart Watson is an 86 y/o male with h/o temporal arteritis/polymalgia rheumatica, previous TIA/CVA, and recurrent pleural effusions s/p bilateral Pleurex tubes.  Has been following with Stuart Watson. His left Pleurex stopped draining and had to be replaced. Initially the new catheter drained fairly well but now it has stopped draining and a CT scan showed a moderate left effusion.  Stuart Watson had difficulty placing the last catheter because of adhesions and probable loculations. He saw him alst week and referred to IR for CT-guided drainage.   Today he returns for HF follow up. Here with his wife. Says left catheter still not draining but he is feeling much better than when he saw Stuart Watson. Can walk 2 miles taking breaks every 1/4 mile. R-sided tube draining well (and clear on CT)  Takes torsemide 60/40. Still with stable leg  edema. No orthopnea or PND.    Past Medical History:  Diagnosis Date  . Actinic keratoses   . Arthritis   . Chronic bilateral pleural effusions   . Chronic sinusitis   . Colon polyps   . Dyspnea 07/13/2013   BNP normal  . Edema 07/13/2013  . GERD (gastroesophageal reflux disease)    omeprazole 1-2 times daily  . H/O TIA (transient ischemic attack) and stroke    on statins and plavix -saw Stuart Watson in the past  . Headache   . History of BPH   . History of TIA (transient ischemic attack) 07/13/2013  . Left inguinal hernia   . Left inguinal hernia 01/27/2019  . Lumbar radiculopathy   . Need for shingles vaccine   . Optic neuritis 10/2015   left eye  . Perennial allergic rhinitis   . Polymyalgia rheumatica ([place]) 07/13/2013   Daily  prednisone  . Restless leg syndrome   . Retinal disease    right eye since birth   . Shingles   . Temporal arteritis ([place]) 10/2015  . Wears glasses   . 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 BRAVO [place] STUDY N/A 04/18/2016   Procedure: BRAVO [place];  Surgeon: Stuart Corner, MD;  [facility];  Service: Endoscopy;  Laterality: N/A;  . CHEST TUBE INSERTION Right 02/13/2018   Procedure: INSERTION PLEURAL DRAINAGE CATHETER;  Surgeon: Stuart Poot, MD;  Location: [facility];  Service: Thoracic;  Laterality: Right;  . CHEST TUBE INSERTION Left 11/27/2019   Procedure: REDO INSERTION PLEURAL DRAINAGE CATHETER USING 15.5 FR. [place] CATH;  Surgeon: Stuart Watson, Collier Salina, MD;  Location: [facility];  Service: Thoracic;  Laterality: Left;  . COLONOSCOPY    . drropy eyelid surgery    . ESOPHAGOGASTRODUODENOSCOPY (EGD) WITH PROPOFOL N/A 04/18/2016   Procedure: ESOPHAGOGASTRODUODENOSCOPY (EGD) WITH PROPOFOL;  Surgeon: Stuart Corner, MD;  [facility];  Service: Endoscopy;  Laterality: N/A;  . INGUINAL HERNIA REPAIR Left 01/27/2019   Procedure: OPEN REPAIR LEFT INGUINAL HERNIA WITH MESH;  Surgeon: Stuart Skates, MD;  Location: [place];  Service: General;  Laterality: Left;  GENERAL AND TAP BLOCK ANESTHESIA  . INSERTION OF MESH Left 01/27/2019   Procedure: Insertion Of  Mesh;  Surgeon: Stuart Skates, MD;  Location: [place];  Service: General;  Laterality: Left;  . IR THORACENTESIS ASP PLEURAL SPACE W/IMG GUIDE  02/04/2018  . IR THORACENTESIS ASP PLEURAL SPACE W/IMG GUIDE  02/06/2018  . REMOVAL OF PLEURAL DRAINAGE CATHETER Left 11/27/2019   Procedure: REMOVAL OF PLEURAL DRAINAGE CATHETER;  Surgeon: Stuart Poot, MD;  Location: [place];  Service: Thoracic;  Laterality: Left;  . RIGHT/LEFT HEART CATH AND CORONARY ANGIOGRAPHY N/A 06/24/2018   Procedure: RIGHT/LEFT HEART CATH AND CORONARY ANGIOGRAPHY;  Surgeon: Stuart Artist, MD;  Location: [facility];  Service: Cardiovascular;  Laterality: N/A;  . VASECTOMY       Current Outpatient Medications  Medication Sig Dispense Refill  . acetaminophen (TYLENOL) 500 MG tablet Take 500 mg by mouth daily as needed for moderate pain or headache.    Marland Kitchen aspirin EC 81 MG tablet Take 81 mg by mouth daily.    . calcium carbonate (TUMS - DOSED IN MG ELEMENTAL CALCIUM) 500 MG chewable tablet Chew 1 tablet by mouth daily as needed for indigestion.    . cetirizine (ZYRTEC) 10 MG tablet Take 10 mg by mouth daily. As needed for allergies    . doxazosin (CARDURA) 8 MG tablet Take 8 mg by mouth daily.    Marland Kitchen levothyroxine (SYNTHROID) 25 MCG tablet Take 25 mcg by mouth daily.    . pantoprazole (PROTONIX) 20 MG tablet Take 20 mg by mouth daily.     Vladimir Faster Glycol-Propyl Glycol (SYSTANE OP) Place 1 drop into both eyes 2 (two) times daily as needed (dry eyes).    . potassium chloride SA (KLOR-CON) 20 MEQ tablet Take 3 tablets (60 mEq total) by mouth 2 (two) times daily. 540 tablet 3  . simvastatin (ZOCOR) 20 MG tablet Take 20 mg by mouth daily.    Marland Kitchen torsemide (DEMADEX) 20 MG tablet Take 3 tablets (60 mg total) by mouth every morning AND 2 tablets (40 mg total) every evening. 450 tablet 3  . vitamin C (ASCORBIC ACID) 250 MG tablet Take 250 mg by mouth daily.     No current facility-administered medications for this encounter.    Allergies:   Patient has no known allergies.   Social History:  The patient  reports that he quit smoking about 44 years ago. His smoking use included cigarettes. He has a 15.00 pack-year smoking history. He has never used smokeless tobacco. He reports current alcohol use of about 7.0 standard drinks of alcohol per week. He reports that he does not use drugs.   Family History:  The patient's family history includes Breast cancer in his mother; Coronary artery disease in his father and mother; Diabetes in his father and mother; Berenice Primas' disease in his sister; Heart attack in his father;  Stroke in his mother.   ROS:  Please see the history of present illness.   All other systems are personally reviewed and negative.   Vitals:   12/30/19 1348  BP: (!) 110/54  Pulse: 84  SpO2: 95%    Exam: General:  Well appearing. No resp difficulty HEENT: normal Neck: supple. no JVD. Carotids 2+ bilat; no bruits. No lymphadenopathy or thryomegaly appreciated. Cor: PMI nondisplaced. Regular rate & rhythm. No rubs, gallops or murmurs. Lungs: clear decreased lung sounds on left Abdomen: soft, nontender, nondistended. No hepatosplenomegaly. No bruits or masses. Good bowel sounds. Extremities: no cyanosis, clubbing, rash, 2+ edema + dystrophic nails.  Neuro: alert & orientedx3, cranial nerves grossly intact. moves  all 4 extremities w/o difficulty. Affect pleasant   Recent Labs: 05/28/2019: B Natriuretic Peptide 33.9 11/27/2019: ALT 10; BUN 16; Creatinine, Ser 0.96; Hemoglobin 11.8; Platelets 415; Potassium 3.7; Sodium 140  Personally reviewed   Wt Readings from Last 3 Encounters:  12/30/19 69.3 kg (152 lb 12.8 oz)  12/24/19 68.9 kg (152 lb)  12/09/19 65.3 kg (144 lb)      ASSESSMENT AND PLAN:  1. Chronic diastolic HF - NYHA II.  - still with residual LE edema. - continue torsemide 60/40 - continue potassium 60meq bid - check labs today a  2. 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 - Myeloma panel with small M-spike with IgG monoclonal with kappa light chains. Likely MGUS - S/p bilat pleurex tubes  - Followed closely by Stuart Stuart Watson  - Right tube functioning well with clear lung on CT. Left tube not draining after replacement. CT shows loculation with moderate residual effusion. However he is feeling ok. Will follow conservatively for now. Check CXR. If effusion worse or he gets more symptomatic with ask IR to evaluate to replace left catheter under CT guidance - We considered somatostatin but they have decided against it for now  3. Coronary  calcifications and abnormal stress test - No s/s ischemia - Left heart cath 12/19 minimal CAD  Signed, Glori Bickers, MD  12/30/2019 2:00 PM  [facility] [place] [address] [phone] (office) [phone] (fax)

## 2020-01-04 ENCOUNTER — Encounter (HOSPITAL_COMMUNITY): Payer: Self-pay

## 2020-01-04 NOTE — Progress Notes (Signed)
JH 2  Stuart Watson. Oats Male, 86 y.o., 03/19/1932 MRN:  [id number] Phone:  [phone] Stuart Watson) PCP:  Wenda Low, MD Primary Cvg:  Medicare/Medicare Part A And B Next Appt With Cardiothoracic Surgery 02/03/2020 at 1:30 PM FW: L Pleurx Catheter Received: 6 days ago Message Details  Rickey Barbara, Stasia Cavalier said that you have the letter H. Can you please schedule [name]? Dr. Lucianne Lei Trigt's office called to get him set up. He will need a covid test since it's a pleurx catheter. Let me know if I need to do anything.   Thanks,  Ash   Previous Messages  ----- Message -----  From: Arne Cleveland, MD  Sent: 11/25/2019  2:47 PM EDT  To: Danielle Dess  Subject: RE: L Pleurx Catheter               [place]   CT guided pleurX placement LEFT    DD[facility]    ----- Message -----  From: Danielle Dess  Sent: 11/25/2019 12:43 PM EDT  To: Ir Procedure Requests  Subject: L Pleurx Catheter                 Procedure: L Pleurx Catheter Placement   Dx: L Pleural Effusion   Ordering: Dr. Tharon Aquas Trigt [phone])   Imaging: DG chest done 11/24/19   Please review. Dr. [name] also has a recent note in epic regarding reasoning for L Pleurx placement.   Thanks,  Lia Foyer

## 2020-01-27 ENCOUNTER — Other Ambulatory Visit: Payer: Self-pay | Admitting: Cardiothoracic Surgery

## 2020-01-27 DIAGNOSIS — J942 Hemothorax: Secondary | ICD-10-CM

## 2020-02-01 DIAGNOSIS — J918 Pleural effusion in other conditions classified elsewhere: Secondary | ICD-10-CM | POA: Diagnosis not present

## 2020-02-03 ENCOUNTER — Ambulatory Visit
Admission: RE | Admit: 2020-02-03 | Discharge: 2020-02-03 | Disposition: A | Discharge status: Home / Self Care | Payer: Medicare Other | Source: Ambulatory Visit | Attending: Cardiothoracic Surgery | Admitting: Cardiothoracic Surgery

## 2020-02-03 ENCOUNTER — Other Ambulatory Visit: Payer: Self-pay

## 2020-02-03 ENCOUNTER — Encounter: Payer: Self-pay | Admitting: Cardiothoracic Surgery

## 2020-02-03 ENCOUNTER — Ambulatory Visit (INDEPENDENT_AMBULATORY_CARE_PROVIDER_SITE_OTHER): Payer: Medicare Other | Admitting: Cardiothoracic Surgery

## 2020-02-03 VITALS — BP 118/57 | HR 83 | Temp 97.8°F | Resp 20 | Ht 66.0 in | Wt 153.0 lb

## 2020-02-03 DIAGNOSIS — I2584 Coronary atherosclerosis due to calcified coronary lesion: Secondary | ICD-10-CM

## 2020-02-03 DIAGNOSIS — Z09 Encounter for follow-up examination after completed treatment for conditions other than malignant neoplasm: Secondary | ICD-10-CM | POA: Diagnosis not present

## 2020-02-03 DIAGNOSIS — J90 Pleural effusion, not elsewhere classified: Secondary | ICD-10-CM | POA: Diagnosis not present

## 2020-02-03 DIAGNOSIS — I2510 Atherosclerotic heart disease of native coronary artery without angina pectoris: Secondary | ICD-10-CM | POA: Diagnosis not present

## 2020-02-03 DIAGNOSIS — R918 Other nonspecific abnormal finding of lung field: Secondary | ICD-10-CM | POA: Diagnosis not present

## 2020-02-03 DIAGNOSIS — J942 Hemothorax: Secondary | ICD-10-CM

## 2020-02-03 NOTE — Progress Notes (Signed)
PCP is Wenda Low, MD Referring Provider is Wenda Low, MD  Chief Complaint  Patient presents with  . Pleural Effusion    6 week f/u with CXR    HPI: The patient returns for bilateral Pleurx catheter check with x-ray.  Right side catheter output remains significant and chest x-ray shows no residual effusion.  Left side catheter drainage is scant.  The patient has a small loculated posterior and lateral effusion on the left not in communication with the catheter.  It would be difficult to place a Pleurx catheter in this area.  The patient remains asymptomatic so this area will be monitored with chest x-ray and symptoms.  Right now I would not recommend trying a Pleurx catheter placement in this posterior loculated fluid.   Past Medical History:  Diagnosis Date  . Actinic keratoses   . Arthritis   . Chronic bilateral pleural effusions   . Chronic sinusitis   . Colon polyps   . Dyspnea 07/13/2013   BNP normal  . Edema 07/13/2013  . GERD (gastroesophageal reflux disease)    omeprazole 1-2 times daily  . H/O TIA (transient ischemic attack) and stroke    on statins and plavix -saw Dr.Sethi in the past  . Headache   . History of BPH   . History of TIA (transient ischemic attack) 07/13/2013  . Left inguinal hernia   . Left inguinal hernia 01/27/2019  . Lumbar radiculopathy   . Need for shingles vaccine   . Optic neuritis 10/2015   left eye  . Perennial allergic rhinitis   . Polymyalgia rheumatica ([place]) 07/13/2013   Daily prednisone  . Restless leg syndrome   . Retinal disease    right eye since birth   . Shingles   . Temporal arteritis ([place]) 10/2015  . Wears glasses   . 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 BRAVO [place] STUDY N/A 04/18/2016   Procedure: BRAVO [place];  Surgeon: Wilford Corner, MD;  [facility];  Service: Endoscopy;  Laterality: N/A;  . CHEST TUBE INSERTION Right 02/13/2018   Procedure: INSERTION  PLEURAL DRAINAGE CATHETER;  Surgeon: Ivin Poot, MD;  Location: [facility];  Service: Thoracic;  Laterality: Right;  . CHEST TUBE INSERTION Left 11/27/2019   Procedure: REDO INSERTION PLEURAL DRAINAGE CATHETER USING 15.5 FR. [place] CATH;  Surgeon: [name], Collier Salina, MD;  Location: [facility];  Service: Thoracic;  Laterality: Left;  . COLONOSCOPY    . drropy eyelid surgery    . ESOPHAGOGASTRODUODENOSCOPY (EGD) WITH PROPOFOL N/A 04/18/2016   Procedure: ESOPHAGOGASTRODUODENOSCOPY (EGD) WITH PROPOFOL;  Surgeon: Wilford Corner, MD;  [facility];  Service: Endoscopy;  Laterality: N/A;  . INGUINAL HERNIA REPAIR Left 01/27/2019   Procedure: OPEN REPAIR LEFT INGUINAL HERNIA WITH MESH;  Surgeon: Fanny Skates, MD;  Location: [facility];  Service: General;  Laterality: Left;  GENERAL AND TAP BLOCK ANESTHESIA  . INSERTION OF MESH Left 01/27/2019   Procedure: Insertion Of Mesh;  Surgeon: Fanny Skates, MD;  Location: [facility];  Service: General;  Laterality: Left;  . IR THORACENTESIS ASP PLEURAL SPACE W/IMG GUIDE  02/04/2018  . IR THORACENTESIS ASP PLEURAL SPACE W/IMG GUIDE  02/06/2018  . REMOVAL OF PLEURAL DRAINAGE CATHETER Left 11/27/2019   Procedure: REMOVAL OF PLEURAL DRAINAGE CATHETER;  Surgeon: Ivin Poot, MD;  Location: [facility];  Service: Thoracic;  Laterality: Left;  . RIGHT/LEFT HEART CATH AND CORONARY ANGIOGRAPHY N/A 06/24/2018   Procedure: RIGHT/LEFT  HEART CATH AND CORONARY ANGIOGRAPHY;  Surgeon: Jolaine Artist, MD;  Location: [facility];  Service: Cardiovascular;  Laterality: N/A;  . VASECTOMY      Family History  Problem Relation Age of Onset  . Stroke Mother   . Coronary artery disease Mother   . Diabetes Mother   . Breast cancer Mother   . Heart attack Father   . Coronary artery disease Father   . Diabetes Father   . Graves' disease Sister     Social History Social History   Tobacco Use  . Smoking status: Former Smoker    Packs/day: 1.00    Years: 15.00    Pack  years: 15.00    Types: Cigarettes    Quit date: 07/23/1975    Years since quitting: 44.5  . Smokeless tobacco: Never Used  Vaping Use  . Vaping Use: Never used  Substance Use Topics  . Alcohol use: Yes    Alcohol/week: 7.0 standard drinks    Types: 7 Glasses of wine per week    Comment: 1 glass red wine daily  . Drug use: No    Current Outpatient Medications  Medication Sig Dispense Refill  . acetaminophen (TYLENOL) 500 MG tablet Take 500 mg by mouth daily as needed for moderate pain or headache.    Marland Kitchen aspirin EC 81 MG tablet Take 81 mg by mouth daily.    . calcium carbonate (TUMS - DOSED IN MG ELEMENTAL CALCIUM) 500 MG chewable tablet Chew 1 tablet by mouth daily as needed for indigestion.    . cetirizine (ZYRTEC) 10 MG tablet Take 10 mg by mouth daily. As needed for allergies    . doxazosin (CARDURA) 8 MG tablet Take 8 mg by mouth daily.    Marland Kitchen levothyroxine (SYNTHROID) 25 MCG tablet Take 25 mcg by mouth daily.    . pantoprazole (PROTONIX) 20 MG tablet Take 20 mg by mouth daily.     Vladimir Faster Glycol-Propyl Glycol (SYSTANE OP) Place 1 drop into both eyes 2 (two) times daily as needed (dry eyes).    . potassium chloride SA (KLOR-CON) 20 MEQ tablet Take 3 tablets (60 mEq total) by mouth 2 (two) times daily. 540 tablet 3  . simvastatin (ZOCOR) 20 MG tablet Take 20 mg by mouth daily.    Marland Kitchen torsemide (DEMADEX) 20 MG tablet Take 3 tablets (60 mg total) by mouth every morning AND 2 tablets (40 mg total) every evening. 450 tablet 3  . vitamin C (ASCORBIC ACID) 250 MG tablet Take 250 mg by mouth daily.     No current facility-administered medications for this visit.    No Known Allergies  Review of Systems  Walking 2 miles daily. No significant shortness of breath Weight stable  BP (!) 118/57   Pulse 83   Temp 97.8 F (36.6 C) (Skin)   Resp 20   Ht 5\' 6"  (1.676 m)   Wt 153 lb (69.4 kg)   SpO2 95% Comment: RA  BMI 24.69 kg/m  Physical Exam      Exam    General- alert and  comfortable    Neck- no JVD, no cervical adenopathy palpable, no carotid bruit   Lungs- clear without rales, wheezes   Cor- regular rate and rhythm, no murmur , gallop   Abdomen- soft, non-tender   Extremities - warm, non-tender, minimal edema   Neuro- oriented, appropriate, no focal weakness   Diagnostic Tests: Today's chest x-ray personally reviewed and discussed with patient showing clear pleural  space on the right, small stable loculated posterior collection on the left  Impression: Bilateral Pleurx catheters with stable pulmonary function  Plan: Continue to drain the right sided catheter.  Change the dressing on the left-sided catheter weekly and attempt drainage every other week.  Return with chest x-ray next month.   Len Childs, MD [facility] [phone]

## 2020-02-08 DIAGNOSIS — K219 Gastro-esophageal reflux disease without esophagitis: Secondary | ICD-10-CM | POA: Diagnosis not present

## 2020-02-08 DIAGNOSIS — J309 Allergic rhinitis, unspecified: Secondary | ICD-10-CM | POA: Diagnosis not present

## 2020-02-08 DIAGNOSIS — M519 Unspecified thoracic, thoracolumbar and lumbosacral intervertebral disc disorder: Secondary | ICD-10-CM | POA: Diagnosis not present

## 2020-02-08 DIAGNOSIS — J90 Pleural effusion, not elsewhere classified: Secondary | ICD-10-CM | POA: Diagnosis not present

## 2020-02-08 DIAGNOSIS — E039 Hypothyroidism, unspecified: Secondary | ICD-10-CM | POA: Diagnosis not present

## 2020-02-08 DIAGNOSIS — M353 Polymyalgia rheumatica: Secondary | ICD-10-CM | POA: Diagnosis not present

## 2020-02-08 DIAGNOSIS — R972 Elevated prostate specific antigen [PSA]: Secondary | ICD-10-CM | POA: Diagnosis not present

## 2020-02-08 DIAGNOSIS — E7800 Pure hypercholesterolemia, unspecified: Secondary | ICD-10-CM | POA: Diagnosis not present

## 2020-02-08 DIAGNOSIS — I700 Atherosclerosis of aorta: Secondary | ICD-10-CM | POA: Diagnosis not present

## 2020-03-15 DIAGNOSIS — J918 Pleural effusion in other conditions classified elsewhere: Secondary | ICD-10-CM | POA: Diagnosis not present

## 2020-03-21 DIAGNOSIS — R413 Other amnesia: Secondary | ICD-10-CM | POA: Diagnosis not present

## 2020-03-21 DIAGNOSIS — E039 Hypothyroidism, unspecified: Secondary | ICD-10-CM | POA: Diagnosis not present

## 2020-03-22 ENCOUNTER — Other Ambulatory Visit: Payer: Self-pay | Admitting: Cardiothoracic Surgery

## 2020-03-22 DIAGNOSIS — J90 Pleural effusion, not elsewhere classified: Secondary | ICD-10-CM

## 2020-03-23 ENCOUNTER — Other Ambulatory Visit: Payer: Self-pay

## 2020-03-23 ENCOUNTER — Ambulatory Visit (INDEPENDENT_AMBULATORY_CARE_PROVIDER_SITE_OTHER): Payer: Medicare Other | Admitting: Cardiothoracic Surgery

## 2020-03-23 ENCOUNTER — Ambulatory Visit
Admission: RE | Admit: 2020-03-23 | Discharge: 2020-03-23 | Disposition: A | Discharge status: Home / Self Care | Payer: Medicare Other | Source: Ambulatory Visit | Attending: Cardiothoracic Surgery | Admitting: Cardiothoracic Surgery

## 2020-03-23 ENCOUNTER — Encounter: Payer: Self-pay | Admitting: Cardiothoracic Surgery

## 2020-03-23 ENCOUNTER — Other Ambulatory Visit: Payer: Self-pay | Admitting: *Deleted

## 2020-03-23 VITALS — BP 96/57 | HR 73 | Temp 97.9°F | Resp 16 | Ht 66.0 in | Wt 149.6 lb

## 2020-03-23 DIAGNOSIS — Z9689 Presence of other specified functional implants: Secondary | ICD-10-CM

## 2020-03-23 DIAGNOSIS — J90 Pleural effusion, not elsewhere classified: Secondary | ICD-10-CM

## 2020-03-23 DIAGNOSIS — I2510 Atherosclerotic heart disease of native coronary artery without angina pectoris: Secondary | ICD-10-CM

## 2020-03-23 DIAGNOSIS — I2584 Coronary atherosclerosis due to calcified coronary lesion: Secondary | ICD-10-CM

## 2020-03-23 NOTE — Progress Notes (Signed)
PCP is Wenda Low, MD Referring Provider is Lauraine Rinne, NP  Chief Complaint  Patient presents with  . Pleural Effusion    7 week f/u with CXR...eval bilateral pl eff/pleurtX    HPI: Patient returns for bilateral Pleurx catheter check with chest x-ray.  No change in symptoms.  The left loculated pleural effusion remains small and asymptomatic.  The left Pleurx catheter continues to have no drainage and will be scheduled to be removed as an outpatient.  The right Pleurx catheter will continue be drained on a Monday Thursday schedule approximately 250-300 cc per drainage  Past Medical History:  Diagnosis Date  . Actinic keratoses   . Arthritis   . Chronic bilateral pleural effusions   . Chronic sinusitis   . Colon polyps   . Dyspnea 07/13/2013   BNP normal  . Edema 07/13/2013  . GERD (gastroesophageal reflux disease)    omeprazole 1-2 times daily  . H/O TIA (transient ischemic attack) and stroke    on statins and plavix -saw Dr.Sethi in the past  . Headache   . History of BPH   . History of TIA (transient ischemic attack) 07/13/2013  . Left inguinal hernia   . Left inguinal hernia 01/27/2019  . Lumbar radiculopathy   . Need for shingles vaccine   . Optic neuritis 10/2015   left eye  . Perennial allergic rhinitis   . Polymyalgia rheumatica ([place]) 07/13/2013   Daily prednisone  . Restless leg syndrome   . Retinal disease    right eye since birth   . Shingles   . Temporal arteritis ([place]) 10/2015  . Wears glasses   . 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 BRAVO [place] STUDY N/A 04/18/2016   Procedure: BRAVO [place];  Surgeon: Wilford Corner, MD;  [facility];  Service: Endoscopy;  Laterality: N/A;  . CHEST TUBE INSERTION Right 02/13/2018   Procedure: INSERTION PLEURAL DRAINAGE CATHETER;  Surgeon: Ivin Poot, MD;  Location: [facility];  Service: Thoracic;  Laterality: Right;  . CHEST TUBE INSERTION Left 11/27/2019    Procedure: REDO INSERTION PLEURAL DRAINAGE CATHETER USING 15.5 FR. [place] CATH;  Surgeon: [name], Collier Salina, MD;  Location: [facility];  Service: Thoracic;  Laterality: Left;  . COLONOSCOPY    . drropy eyelid surgery    . ESOPHAGOGASTRODUODENOSCOPY (EGD) WITH PROPOFOL N/A 04/18/2016   Procedure: ESOPHAGOGASTRODUODENOSCOPY (EGD) WITH PROPOFOL;  Surgeon: Wilford Corner, MD;  [facility];  Service: Endoscopy;  Laterality: N/A;  . INGUINAL HERNIA REPAIR Left 01/27/2019   Procedure: OPEN REPAIR LEFT INGUINAL HERNIA WITH MESH;  Surgeon: Fanny Skates, MD;  Location: [facility];  Service: General;  Laterality: Left;  GENERAL AND TAP BLOCK ANESTHESIA  . INSERTION OF MESH Left 01/27/2019   Procedure: Insertion Of Mesh;  Surgeon: Fanny Skates, MD;  Location: [facility];  Service: General;  Laterality: Left;  . IR THORACENTESIS ASP PLEURAL SPACE W/IMG GUIDE  02/04/2018  . IR THORACENTESIS ASP PLEURAL SPACE W/IMG GUIDE  02/06/2018  . REMOVAL OF PLEURAL DRAINAGE CATHETER Left 11/27/2019   Procedure: REMOVAL OF PLEURAL DRAINAGE CATHETER;  Surgeon: Ivin Poot, MD;  Location: [place];  Service: Thoracic;  Laterality: Left;  . RIGHT/LEFT HEART CATH AND CORONARY ANGIOGRAPHY N/A 06/24/2018   Procedure: RIGHT/LEFT HEART CATH AND CORONARY ANGIOGRAPHY;  Surgeon: Jolaine Artist, MD;  Location: [facility];  Service: Cardiovascular;  Laterality: N/A;  . VASECTOMY  Family History  Problem Relation Age of Onset  . Stroke Mother   . Coronary artery disease Mother   . Diabetes Mother   . Breast cancer Mother   . Heart attack Father   . Coronary artery disease Father   . Diabetes Father   . Graves' disease Sister     Social History Social History   Tobacco Use  . Smoking status: Former Smoker    Packs/day: 1.00    Years: 15.00    Pack years: 15.00    Types: Cigarettes    Quit date: 07/23/1975    Years since quitting: 44.6  . Smokeless tobacco: Never Used  Vaping Use  . Vaping Use: Never  used  Substance Use Topics  . Alcohol use: Yes    Alcohol/week: 7.0 standard drinks    Types: 7 Glasses of wine per week    Comment: 1 glass red wine daily  . Drug use: No    Current Outpatient Medications  Medication Sig Dispense Refill  . acetaminophen (TYLENOL) 500 MG tablet Take 500 mg by mouth daily as needed for moderate pain or headache.    Marland Kitchen aspirin EC 81 MG tablet Take 81 mg by mouth daily.    . calcium carbonate (TUMS - DOSED IN MG ELEMENTAL CALCIUM) 500 MG chewable tablet Chew 1 tablet by mouth daily as needed for indigestion.    . cetirizine (ZYRTEC) 10 MG tablet Take 10 mg by mouth daily. As needed for allergies    . doxazosin (CARDURA) 8 MG tablet Take 8 mg by mouth daily.    Marland Kitchen levothyroxine (SYNTHROID) 25 MCG tablet Take 25 mcg by mouth daily.    . pantoprazole (PROTONIX) 20 MG tablet Take 20 mg by mouth daily.     Vladimir Faster Glycol-Propyl Glycol (SYSTANE OP) Place 1 drop into both eyes 2 (two) times daily as needed (dry eyes).    . potassium chloride SA (KLOR-CON) 20 MEQ tablet Take 3 tablets (60 mEq total) by mouth 2 (two) times daily. 540 tablet 3  . simvastatin (ZOCOR) 20 MG tablet Take 20 mg by mouth daily.    Marland Kitchen torsemide (DEMADEX) 20 MG tablet Take 3 tablets (60 mg total) by mouth every morning AND 2 tablets (40 mg total) every evening. 450 tablet 3  . vitamin C (ASCORBIC ACID) 250 MG tablet Take 250 mg by mouth daily.     No current facility-administered medications for this visit.    No Known Allergies  Review of Systems Weight stable No cough No drainage around Pleurx catheter sites Stable lower extremity edema BP (!) 96/57 (BP Location: Left Arm, Patient Position: Sitting, Cuff Size: Normal)   Pulse 73   Temp 97.9 F (36.6 C)   Resp 16   Ht 5\' 6"  (1.676 m)   Wt 149 lb 9.6 oz (67.9 kg)   SpO2 97% Comment: RA  BMI 24.15 kg/m  Physical Exam      Exam    General- alert and comfortable    Neck- no JVD, no cervical adenopathy palpable, no carotid  bruit   Lungs- clear without rales, wheezes   Cor- regular rate and rhythm, no murmur , gallop   Abdomen- soft, non-tender   Extremities - warm, non-tender, minimal edema   Neuro- oriented, appropriate, no focal weakness   Diagnostic Tests: Today's chest x-ray personally reviewed showing stable unchanged mild left loculated pleural effusion.  Right pleural space is clear.  Pleurx catheters are both unchanged in position.  Impression: Recurrent pleural  effusions from yellow nail syndrome Bilateral Pleurx catheters placed August 2019 Left Pleurx catheter has stopped draining and will be removed as an outpatient next week.  I discussed the procedure of Pleurx catheter removal under local anesthesia and the patient understands the benefits and risks.  Plan: Removal of left Pleurx catheter on September 9 at [facility] short stay area.  Len Childs, MD [facility] [phone]

## 2020-03-31 ENCOUNTER — Encounter (HOSPITAL_COMMUNITY)
Admission: RE | Disposition: A | Discharge status: Home / Self Care | Payer: Self-pay | Source: Home / Self Care | Attending: Cardiothoracic Surgery

## 2020-03-31 ENCOUNTER — Encounter (HOSPITAL_COMMUNITY): Payer: Self-pay | Admitting: Cardiothoracic Surgery

## 2020-03-31 ENCOUNTER — Ambulatory Visit (HOSPITAL_COMMUNITY)
Admission: RE | Admit: 2020-03-31 | Discharge: 2020-03-31 | Disposition: A | Discharge status: Home / Self Care | Payer: Medicare Other | Attending: Cardiothoracic Surgery | Admitting: Cardiothoracic Surgery

## 2020-03-31 DIAGNOSIS — L605 Yellow nail syndrome: Secondary | ICD-10-CM | POA: Diagnosis not present

## 2020-03-31 DIAGNOSIS — Z79899 Other long term (current) drug therapy: Secondary | ICD-10-CM | POA: Insufficient documentation

## 2020-03-31 DIAGNOSIS — M353 Polymyalgia rheumatica: Secondary | ICD-10-CM | POA: Diagnosis not present

## 2020-03-31 DIAGNOSIS — Z79890 Hormone replacement therapy (postmenopausal): Secondary | ICD-10-CM | POA: Diagnosis not present

## 2020-03-31 DIAGNOSIS — Z8673 Personal history of transient ischemic attack (TIA), and cerebral infarction without residual deficits: Secondary | ICD-10-CM | POA: Diagnosis not present

## 2020-03-31 DIAGNOSIS — Z4803 Encounter for change or removal of drains: Secondary | ICD-10-CM | POA: Insufficient documentation

## 2020-03-31 DIAGNOSIS — Z7982 Long term (current) use of aspirin: Secondary | ICD-10-CM | POA: Insufficient documentation

## 2020-03-31 DIAGNOSIS — M1990 Unspecified osteoarthritis, unspecified site: Secondary | ICD-10-CM | POA: Diagnosis not present

## 2020-03-31 DIAGNOSIS — Z87891 Personal history of nicotine dependence: Secondary | ICD-10-CM | POA: Diagnosis not present

## 2020-03-31 DIAGNOSIS — K219 Gastro-esophageal reflux disease without esophagitis: Secondary | ICD-10-CM | POA: Diagnosis not present

## 2020-03-31 DIAGNOSIS — J90 Pleural effusion, not elsewhere classified: Secondary | ICD-10-CM | POA: Diagnosis not present

## 2020-03-31 HISTORY — PX: REMOVAL OF PLEURAL DRAINAGE CATHETER: SHX5080

## 2020-03-31 SURGERY — REMOVAL, CLOSED DRAINAGE CATHETER SYSTEM, PLEURAL
Anesthesia: Monitor Anesthesia Care | Laterality: Left

## 2020-03-31 NOTE — Progress Notes (Signed)
Patient d/c'd, AxOX4. No apparent distress, and no c/o pain. Will be transported out via wheel chair to main entrance. Discharge person is wife Arbie Cookey.

## 2020-03-31 NOTE — H&P (Addendum)
[address] HouseSuite 411            Stuart Watson,Stuart Watson [id number]          [phone]     Stuart Watson is an 86 y.o. male. 06-Aug-1931 [id number]   Chief Complaint: Patient presents for removal of left pleur X catheter  History of Presenting Illness:  This is an 86 year old male with bilateral, chronic pleural effusions (yellow nail syndrome) who has not had any drainage from left Pleur X catheter in over a month. He has had no change in symptoms.  The left loculated pleural effusion remains small and asymptomatic.    The right Pleurx catheter will continue be drained on a Monday Thursday schedule approximately 250-300 cc per drainage. He present today for removal of left Pleur X catheter.  Past Medical History: Past Medical History:  Diagnosis Date   Actinic keratoses    Arthritis    Chronic bilateral pleural effusions    Chronic sinusitis    Colon polyps    Dyspnea 07/13/2013   BNP normal   Edema 07/13/2013   GERD (gastroesophageal reflux disease)    omeprazole 1-2 times daily   H/O TIA (transient ischemic attack) and stroke    on statins and plavix -saw Dr.Sethi in the past   Headache    History of BPH    History of TIA (transient ischemic attack) 07/13/2013   Left inguinal hernia    Left inguinal hernia 01/27/2019   Lumbar radiculopathy    Need for shingles vaccine    Optic neuritis 10/2015   left eye   Perennial allergic rhinitis    Polymyalgia rheumatica ([place]) 07/13/2013   Daily prednisone   Restless leg syndrome    Retinal disease    right eye since birth    Shingles    Temporal arteritis ([place]) 10/2015   Wears glasses    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    Past Surgical History: Past Surgical History:  Procedure Laterality Date   BRAVO [place] STUDY N/A 04/18/2016   Procedure: BRAVO [place] STUDY;  Surgeon: Wilford Corner, MD;  [facility];  Service: Endoscopy;  Laterality: N/A;   CHEST TUBE INSERTION Right 02/13/2018   Procedure: INSERTION  PLEURAL DRAINAGE CATHETER;  Surgeon: Ivin Poot, MD;  Location: [place];  Service: Thoracic;  Laterality: Right;   CHEST TUBE INSERTION Left 11/27/2019   Procedure: REDO INSERTION PLEURAL DRAINAGE CATHETER USING 15.5 FR. [place] CATH;  Surgeon: [name], Collier Salina, MD;  Location: [place];  Service: Thoracic;  Laterality: Left;   COLONOSCOPY     drropy eyelid surgery     ESOPHAGOGASTRODUODENOSCOPY (EGD) WITH PROPOFOL N/A 04/18/2016   Procedure: ESOPHAGOGASTRODUODENOSCOPY (EGD) WITH PROPOFOL;  Surgeon: Wilford Corner, MD;  [facility];  Service: Endoscopy;  Laterality: N/A;   INGUINAL HERNIA REPAIR Left 01/27/2019   Procedure: OPEN REPAIR LEFT INGUINAL HERNIA WITH MESH;  Surgeon: Fanny Skates, MD;  Location: [place];  Service: General;  Laterality: Left;  GENERAL AND TAP BLOCK ANESTHESIA   INSERTION OF MESH Left 01/27/2019   Procedure: Insertion Of Mesh;  Surgeon: Fanny Skates, MD;  Location: [place];  Service: General;  Laterality: Left;   IR THORACENTESIS ASP PLEURAL SPACE W/IMG GUIDE  02/04/2018   IR THORACENTESIS ASP PLEURAL SPACE W/IMG GUIDE  02/06/2018   REMOVAL OF PLEURAL DRAINAGE CATHETER Left 11/27/2019   Procedure: REMOVAL OF PLEURAL DRAINAGE CATHETER;  Surgeon: [name], Collier Salina, MD;  Location: [place];  Service: Thoracic;  Laterality: Left;   RIGHT/LEFT HEART CATH AND CORONARY ANGIOGRAPHY N/A 06/24/2018   Procedure: RIGHT/LEFT HEART CATH AND CORONARY ANGIOGRAPHY;  Surgeon: Jolaine Artist, MD;  Location: [place] [facility];  Service: Cardiovascular;  Laterality: N/A;   VASECTOMY      Family History: Family History  Problem Relation Age of Onset   Stroke Mother    Coronary artery disease Mother    Diabetes Mother    Breast cancer Mother    Heart attack Father    Coronary artery disease Father    Diabetes Father    Berenice Primas' disease Sister      Social History:   reports that he quit smoking about 44 years ago. His smoking use included cigarettes. He has a 15.00 pack-year  smoking history. He has never used smokeless tobacco. He reports current alcohol use of about 7.0 standard drinks of alcohol per week. He reports that he does not use drugs.  Allergies:  No Known Allergies  Medications Prior to Admission  Medication Sig Dispense Refill   acetaminophen (TYLENOL) 500 MG tablet Take 500 mg by mouth daily as needed for moderate pain.      aspirin EC 81 MG tablet Take 81 mg by mouth daily.     calcium carbonate (TUMS - DOSED IN MG ELEMENTAL CALCIUM) 500 MG chewable tablet Chew 1 tablet by mouth daily as needed for indigestion.     cetirizine (ZYRTEC) 10 MG tablet Take 10 mg by mouth daily as needed for allergies.      doxazosin (CARDURA) 8 MG tablet Take 8 mg by mouth daily.     levothyroxine (SYNTHROID) 25 MCG tablet Take 25 mcg by mouth daily.     Multiple Vitamins-Minerals (MULTIVITAMIN WITH MINERALS) tablet Take 1 tablet by mouth daily.     Omega-3 Fatty Acids (FISH OIL PO) Take 1 tablet by mouth daily.     pantoprazole (PROTONIX) 20 MG tablet Take 20 mg by mouth daily.      Polyethyl Glycol-Propyl Glycol (SYSTANE OP) Place 1 drop into both eyes 2 (two) times daily as needed (dry eyes).     potassium chloride SA (KLOR-CON) 20 MEQ tablet Take 3 tablets (60 mEq total) by mouth 2 (two) times daily. (Patient taking differently: Take 60 mEq by mouth 2 (two) times daily. Take 60 mg in the morning and 40 mg at bedtime) 540 tablet 3   simvastatin (ZOCOR) 20 MG tablet Take 20 mg by mouth daily.     torsemide (DEMADEX) 20 MG tablet Take 3 tablets (60 mg total) by mouth every morning AND 2 tablets (40 mg total) every evening. 450 tablet 3   vitamin C (ASCORBIC ACID) 250 MG tablet Take 250 mg by mouth daily.     BP (!) 139/51   Pulse 72   Temp 97.9 F (36.6 C) (Temporal)   Resp 18   Ht 5\' 6"  (1.676 m)   Wt 67.9 kg   SpO2 97%   BMI 24.15 kg/m   Physical Exam: General appearance: alert, cooperative and no distress Heart: RRR Extremities: Trace LE edema Wound:  Right pleur x catheter dressing dry and intact. Left pleur X catheter with eschar and no suture to remove   Diagnostic Studies and Laboratory Results: No results found for this or any previous visit (from the past 48 hour(s)). No results found.   Assessment/Plan Patient presents today for removal of left Pleur X catheter. Patient  was prepped and draped in a sterile fashion, using Betadine. Xylocaine was applied around Pleur X catheter wound. Cuff was carefully dissected out and left Pleur X catheter was removed. There was some minor bleeding afterward and patient tolerated procedure well. 2 Nylon sutures were placed. Patient has a follow up appointment to see Dr. [name] next Wednesday. He was instructed if he has worsening bleeding to call the office. If he has acute onset of shortness of breath, he is to go to ED. He was told to remove dressing and shower on 04/01/2020.  Nani Skillern, PA-C 03/31/2020, 11:46 AM  patient examined and medical record reviewed,agree with above note. Tharon Aquas Trigt III 04/01/2020

## 2020-04-01 ENCOUNTER — Encounter (HOSPITAL_COMMUNITY): Payer: Self-pay | Admitting: Cardiothoracic Surgery

## 2020-04-05 ENCOUNTER — Other Ambulatory Visit: Payer: Self-pay | Admitting: Cardiothoracic Surgery

## 2020-04-05 DIAGNOSIS — J90 Pleural effusion, not elsewhere classified: Secondary | ICD-10-CM

## 2020-04-06 ENCOUNTER — Encounter: Payer: Self-pay | Admitting: Cardiothoracic Surgery

## 2020-04-06 ENCOUNTER — Other Ambulatory Visit: Payer: Self-pay

## 2020-04-06 ENCOUNTER — Ambulatory Visit
Admission: RE | Admit: 2020-04-06 | Discharge: 2020-04-06 | Disposition: A | Discharge status: Home / Self Care | Payer: Medicare Other | Source: Ambulatory Visit | Attending: Cardiothoracic Surgery | Admitting: Cardiothoracic Surgery

## 2020-04-06 ENCOUNTER — Ambulatory Visit (INDEPENDENT_AMBULATORY_CARE_PROVIDER_SITE_OTHER): Payer: Medicare Other | Admitting: Cardiothoracic Surgery

## 2020-04-06 VITALS — BP 119/66 | HR 70 | Temp 97.6°F | Resp 20 | Ht 66.0 in | Wt 151.0 lb

## 2020-04-06 DIAGNOSIS — I2584 Coronary atherosclerosis due to calcified coronary lesion: Secondary | ICD-10-CM

## 2020-04-06 DIAGNOSIS — I2510 Atherosclerotic heart disease of native coronary artery without angina pectoris: Secondary | ICD-10-CM

## 2020-04-06 DIAGNOSIS — R05 Cough: Secondary | ICD-10-CM | POA: Diagnosis not present

## 2020-04-06 DIAGNOSIS — J90 Pleural effusion, not elsewhere classified: Secondary | ICD-10-CM | POA: Diagnosis not present

## 2020-04-06 DIAGNOSIS — Z4802 Encounter for removal of sutures: Secondary | ICD-10-CM | POA: Diagnosis not present

## 2020-04-06 DIAGNOSIS — Z9689 Presence of other specified functional implants: Secondary | ICD-10-CM | POA: Diagnosis not present

## 2020-04-06 MED ORDER — CEPHALEXIN 500 MG PO CAPS: 500.0000 mg | ORAL_CAPSULE | Freq: Three times a day (TID) | ORAL | 0 refills | Status: DC

## 2020-04-06 NOTE — Progress Notes (Signed)
PCP is Wenda Low, MD Referring Provider is Wenda Low, MD  Chief Complaint  Patient presents with  . Pleural Effusion    2 week f/u with CXR    HPI: The patient returns for wound check and suture removal after his left Pleurx catheter was removed as an outpatient last week. Chest x-ray today shows no significant pleural effusion on the left. The patient continues to drain the right Pleurx catheter twice a week with volume is approximately 200 cc each session.  The sutures are removed. There is erythema at the site. There is no drainage. There is no skin separation. He will be given a course of Keflex for mild cellulitis.    Past Medical History:  Diagnosis Date  . Actinic keratoses   . Arthritis   . Chronic bilateral pleural effusions   . Chronic sinusitis   . Colon polyps   . Dyspnea 07/13/2013   BNP normal  . Edema 07/13/2013  . GERD (gastroesophageal reflux disease)    omeprazole 1-2 times daily  . H/O TIA (transient ischemic attack) and stroke    on statins and plavix -saw Dr.Sethi in the past  . Headache   . History of BPH   . History of TIA (transient ischemic attack) 07/13/2013  . Left inguinal hernia   . Left inguinal hernia 01/27/2019  . Lumbar radiculopathy   . Need for shingles vaccine   . Optic neuritis 10/2015   left eye  . Perennial allergic rhinitis   . Polymyalgia rheumatica ([place]) 07/13/2013   Daily prednisone  . Restless leg syndrome   . Retinal disease    right eye since birth   . Shingles   . Temporal arteritis ([place]) 10/2015  . Wears glasses   . 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 BRAVO [place] STUDY N/A 04/18/2016   Procedure: BRAVO [place];  Surgeon: Wilford Corner, MD;  [facility];  Service: Endoscopy;  Laterality: N/A;  . CHEST TUBE INSERTION Right 02/13/2018   Procedure: INSERTION PLEURAL DRAINAGE CATHETER;  Surgeon: Ivin Poot, MD;  Location: [place];  Service: Thoracic;   Laterality: Right;  . CHEST TUBE INSERTION Left 11/27/2019   Procedure: REDO INSERTION PLEURAL DRAINAGE CATHETER USING 15.5 FR. [place] CATH;  Surgeon: [name], Collier Salina, MD;  Location: [place];  Service: Thoracic;  Laterality: Left;  . COLONOSCOPY    . drropy eyelid surgery    . ESOPHAGOGASTRODUODENOSCOPY (EGD) WITH PROPOFOL N/A 04/18/2016   Procedure: ESOPHAGOGASTRODUODENOSCOPY (EGD) WITH PROPOFOL;  Surgeon: Wilford Corner, MD;  [facility];  Service: Endoscopy;  Laterality: N/A;  . INGUINAL HERNIA REPAIR Left 01/27/2019   Procedure: OPEN REPAIR LEFT INGUINAL HERNIA WITH MESH;  Surgeon: Fanny Skates, MD;  Location: [place];  Service: General;  Laterality: Left;  GENERAL AND TAP BLOCK ANESTHESIA  . INSERTION OF MESH Left 01/27/2019   Procedure: Insertion Of Mesh;  Surgeon: Fanny Skates, MD;  Location: [facility];  Service: General;  Laterality: Left;  . IR THORACENTESIS ASP PLEURAL SPACE W/IMG GUIDE  02/04/2018  . IR THORACENTESIS ASP PLEURAL SPACE W/IMG GUIDE  02/06/2018  . REMOVAL OF PLEURAL DRAINAGE CATHETER Left 11/27/2019   Procedure: REMOVAL OF PLEURAL DRAINAGE CATHETER;  Surgeon: Ivin Poot, MD;  Location: [place];  Service: Thoracic;  Laterality: Left;  . REMOVAL OF PLEURAL DRAINAGE CATHETER Left 03/31/2020   Procedure: REMOVAL OF PLEURAL DRAINAGE CATHETER;  Surgeon: Ivin Poot, MD;  Location: [place];  Service: Thoracic;  Laterality: Left;  . RIGHT/LEFT HEART CATH AND CORONARY ANGIOGRAPHY N/A 06/24/2018   Procedure: RIGHT/LEFT HEART CATH AND CORONARY ANGIOGRAPHY;  Surgeon: Jolaine Artist, MD;  Location: [place] [facility];  Service: Cardiovascular;  Laterality: N/A;  . VASECTOMY      Family History  Problem Relation Age of Onset  . Stroke Mother   . Coronary artery disease Mother   . Diabetes Mother   . Breast cancer Mother   . Heart attack Father   . Coronary artery disease Father   . Diabetes Father   . Graves' disease Sister     Social History Social History    Tobacco Use  . Smoking status: Former Smoker    Packs/day: 1.00    Years: 15.00    Pack years: 15.00    Types: Cigarettes    Quit date: 07/23/1975    Years since quitting: 44.7  . Smokeless tobacco: Never Used  Vaping Use  . Vaping Use: Never used  Substance Use Topics  . Alcohol use: Yes    Alcohol/week: 7.0 standard drinks    Types: 7 Glasses of wine per week    Comment: 1 glass red wine daily  . Drug use: No    Current Outpatient Medications  Medication Sig Dispense Refill  . acetaminophen (TYLENOL) 500 MG tablet Take 500 mg by mouth daily as needed for moderate pain.     Marland Kitchen aspirin EC 81 MG tablet Take 81 mg by mouth daily.    . calcium carbonate (TUMS - DOSED IN MG ELEMENTAL CALCIUM) 500 MG chewable tablet Chew 1 tablet by mouth daily as needed for indigestion.    . cetirizine (ZYRTEC) 10 MG tablet Take 10 mg by mouth daily as needed for allergies.     Marland Kitchen doxazosin (CARDURA) 8 MG tablet Take 8 mg by mouth daily.    Marland Kitchen levothyroxine (SYNTHROID) 25 MCG tablet Take 25 mcg by mouth daily.    . Multiple Vitamins-Minerals (MULTIVITAMIN WITH MINERALS) tablet Take 1 tablet by mouth daily.    . Omega-3 Fatty Acids (FISH OIL PO) Take 1 tablet by mouth daily.    . pantoprazole (PROTONIX) 20 MG tablet Take 20 mg by mouth daily.     Vladimir Faster Glycol-Propyl Glycol (SYSTANE OP) Place 1 drop into both eyes 2 (two) times daily as needed (dry eyes).    . potassium chloride SA (KLOR-CON) 20 MEQ tablet Take 3 tablets (60 mEq total) by mouth 2 (two) times daily. (Patient taking differently: Take 60 mEq by mouth 2 (two) times daily. Take 60 mg in the morning and 40 mg at bedtime) 540 tablet 3  . simvastatin (ZOCOR) 20 MG tablet Take 20 mg by mouth daily.    Marland Kitchen torsemide (DEMADEX) 20 MG tablet Take 3 tablets (60 mg total) by mouth every morning AND 2 tablets (40 mg total) every evening. 450 tablet 3  . vitamin C (ASCORBIC ACID) 250 MG tablet Take 250 mg by mouth daily.     No current  facility-administered medications for this visit.    No Known Allergies  Review of Systems   No cough or shortness of breath No fever No change in mild ankle edema Good appetite and overall strength  BP 119/66   Pulse 70   Temp 97.6 F (36.4 C) (Skin)   Resp 20   Ht 5\' 6"  (1.676 m)   Wt 151 lb (68.5 kg)   SpO2 96% Comment: RA  BMI 24.37 kg/m  Physical  Exam       Exam    General- alert and comfortable. Sutures removed at old catheter site. Site has some mild cellulitis but no drainage. The catheter had been in place for over a year so this is not unexpected.    Neck- no JVD, no cervical adenopathy palpable, no carotid bruit   Lungs- clear without rales, wheezes   Cor- regular rate and rhythm, no murmur , gallop   Abdomen- soft, non-tender   Extremities - warm, non-tender, minimal edema   Neuro- oriented, appropriate, no focal weakness   Diagnostic Tests: Today's chest x-ray reviewed showing no significant increase in left pleural thickening-fluid. Right Pleurx cath remains in good position with minimal effusion.  Impression: Left Pleurx catheter removed -- indwelling for drainage of recurrent left pleural effusion. Now with some residual mild cellulitis at exit site for which he will be treated with Keflex for 1 week  Plan: Return for wound check in 1 week. Continue drainage of right Pleurx catheter twice a week.   Len Childs, MD [facility] [phone]

## 2020-04-12 ENCOUNTER — Other Ambulatory Visit: Payer: Self-pay

## 2020-04-12 ENCOUNTER — Encounter: Payer: Self-pay | Admitting: Cardiothoracic Surgery

## 2020-04-12 ENCOUNTER — Ambulatory Visit (INDEPENDENT_AMBULATORY_CARE_PROVIDER_SITE_OTHER): Payer: Medicare Other | Admitting: Cardiothoracic Surgery

## 2020-04-12 VITALS — BP 137/71 | HR 73 | Temp 97.7°F | Resp 20 | Ht 66.0 in | Wt 151.0 lb

## 2020-04-12 DIAGNOSIS — J90 Pleural effusion, not elsewhere classified: Secondary | ICD-10-CM

## 2020-04-12 NOTE — Progress Notes (Signed)
PCP is Wenda Low, MD Referring Provider is Wenda Low, MD  Chief Complaint  Patient presents with  . Pleural Effusion    1 week f/u    HPI: Patient presents for scheduled office visit for wound check of the left Pleurx catheter exit site.  The catheter was removed because of loculation of the pleural effusion and scant output.  The catheter had been in place for several months and at the last office visit when the sutures were removed there was some erythema and mild cellulitis at the exit site.  The patient was started on a course of oral Keflex and has 2-3 doses left.  The patient states his shortness of breath is minimal and stable.  He denies any tenderness at the left Pleurx catheter site.  He denies drainage or fever.  He continues to drain the right Pleurx catheter approximately twice a week with a weekly total approximately 500 cc.   Past Medical History:  Diagnosis Date  . Actinic keratoses   . Arthritis   . Chronic bilateral pleural effusions   . Chronic sinusitis   . Colon polyps   . Dyspnea 07/13/2013   BNP normal  . Edema 07/13/2013  . GERD (gastroesophageal reflux disease)    omeprazole 1-2 times daily  . H/O TIA (transient ischemic attack) and stroke    on statins and plavix -saw Dr.Sethi in the past  . Headache   . History of BPH   . History of TIA (transient ischemic attack) 07/13/2013  . Left inguinal hernia   . Left inguinal hernia 01/27/2019  . Lumbar radiculopathy   . Need for shingles vaccine   . Optic neuritis 10/2015   left eye  . Perennial allergic rhinitis   . Polymyalgia rheumatica ([place]) 07/13/2013   Daily prednisone  . Restless leg syndrome   . Retinal disease    right eye since birth   . Shingles   . Temporal arteritis ([place]) 10/2015  . Wears glasses   . 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 BRAVO [place] STUDY N/A 04/18/2016   Procedure: BRAVO [place];  Surgeon: Wilford Corner, MD;   [facility];  Service: Endoscopy;  Laterality: N/A;  . CHEST TUBE INSERTION Right 02/13/2018   Procedure: INSERTION PLEURAL DRAINAGE CATHETER;  Surgeon: Ivin Poot, MD;  Location: [place];  Service: Thoracic;  Laterality: Right;  . CHEST TUBE INSERTION Left 11/27/2019   Procedure: REDO INSERTION PLEURAL DRAINAGE CATHETER USING 15.5 FR. [place] CATH;  Surgeon: [name], Collier Salina, MD;  Location: [place];  Service: Thoracic;  Laterality: Left;  . COLONOSCOPY    . drropy eyelid surgery    . ESOPHAGOGASTRODUODENOSCOPY (EGD) WITH PROPOFOL N/A 04/18/2016   Procedure: ESOPHAGOGASTRODUODENOSCOPY (EGD) WITH PROPOFOL;  Surgeon: Wilford Corner, MD;  [facility];  Service: Endoscopy;  Laterality: N/A;  . INGUINAL HERNIA REPAIR Left 01/27/2019   Procedure: OPEN REPAIR LEFT INGUINAL HERNIA WITH MESH;  Surgeon: Fanny Skates, MD;  Location: [place];  Service: General;  Laterality: Left;  GENERAL AND TAP BLOCK ANESTHESIA  . INSERTION OF MESH Left 01/27/2019   Procedure: Insertion Of Mesh;  Surgeon: Fanny Skates, MD;  Location: [place];  Service: General;  Laterality: Left;  . IR THORACENTESIS ASP PLEURAL SPACE W/IMG GUIDE  02/04/2018  . IR THORACENTESIS ASP PLEURAL SPACE W/IMG GUIDE  02/06/2018  . REMOVAL OF PLEURAL DRAINAGE CATHETER Left 11/27/2019   Procedure: REMOVAL OF PLEURAL DRAINAGE CATHETER;  Surgeon: [name], Collier Salina, MD;  Location: [place];  Service: Thoracic;  Laterality: Left;  . REMOVAL OF PLEURAL DRAINAGE CATHETER Left 03/31/2020   Procedure: REMOVAL OF PLEURAL DRAINAGE CATHETER;  Surgeon: Ivin Poot, MD;  Location: [place];  Service: Thoracic;  Laterality: Left;  . RIGHT/LEFT HEART CATH AND CORONARY ANGIOGRAPHY N/A 06/24/2018   Procedure: RIGHT/LEFT HEART CATH AND CORONARY ANGIOGRAPHY;  Surgeon: Jolaine Artist, MD;  Location: [place] [facility];  Service: Cardiovascular;  Laterality: N/A;  . VASECTOMY      Family History  Problem Relation Age of Onset  . Stroke Mother   .  Coronary artery disease Mother   . Diabetes Mother   . Breast cancer Mother   . Heart attack Father   . Coronary artery disease Father   . Diabetes Father   . Graves' disease Sister     Social History Social History   Tobacco Use  . Smoking status: Former Smoker    Packs/day: 1.00    Years: 15.00    Pack years: 15.00    Types: Cigarettes    Quit date: 07/23/1975    Years since quitting: 44.7  . Smokeless tobacco: Never Used  Vaping Use  . Vaping Use: Never used  Substance Use Topics  . Alcohol use: Yes    Alcohol/week: 7.0 standard drinks    Types: 7 Glasses of wine per week    Comment: 1 glass red wine daily  . Drug use: No    Current Outpatient Medications  Medication Sig Dispense Refill  . acetaminophen (TYLENOL) 500 MG tablet Take 500 mg by mouth daily as needed for moderate pain.     Marland Kitchen aspirin EC 81 MG tablet Take 81 mg by mouth daily.    . calcium carbonate (TUMS - DOSED IN MG ELEMENTAL CALCIUM) 500 MG chewable tablet Chew 1 tablet by mouth daily as needed for indigestion.    . cetirizine (ZYRTEC) 10 MG tablet Take 10 mg by mouth daily as needed for allergies.     Marland Kitchen doxazosin (CARDURA) 8 MG tablet Take 8 mg by mouth daily.    Marland Kitchen levothyroxine (SYNTHROID) 25 MCG tablet Take 25 mcg by mouth daily.    . Multiple Vitamins-Minerals (MULTIVITAMIN WITH MINERALS) tablet Take 1 tablet by mouth daily.    . Omega-3 Fatty Acids (FISH OIL PO) Take 1 tablet by mouth daily.    . pantoprazole (PROTONIX) 20 MG tablet Take 20 mg by mouth daily.     Vladimir Faster Glycol-Propyl Glycol (SYSTANE OP) Place 1 drop into both eyes 2 (two) times daily as needed (dry eyes).    . potassium chloride SA (KLOR-CON) 20 MEQ tablet Take 3 tablets (60 mEq total) by mouth 2 (two) times daily. (Patient taking differently: Take 60 mEq by mouth 2 (two) times daily. Take 60 mg in the morning and 40 mg at bedtime) 540 tablet 3  . simvastatin (ZOCOR) 20 MG tablet Take 20 mg by mouth daily.    Marland Kitchen torsemide  (DEMADEX) 20 MG tablet Take 3 tablets (60 mg total) by mouth every morning AND 2 tablets (40 mg total) every evening. 450 tablet 3  . vitamin C (ASCORBIC ACID) 250 MG tablet Take 250 mg by mouth daily.     No current facility-administered medications for this visit.    No Known Allergies  Review of Systems  Weight stable Stable minimal pedal edema No fever No shortness of breath No cough or chest pain No loss of taste or  smell Recently received his influenza vaccine  BP 137/71   Pulse 73   Temp 97.7 F (36.5 C) (Skin)   Resp 20   Ht 5\' 6"  (1.676 m)   Wt 151 lb (68.5 kg)   SpO2 94% Comment: RA  BMI 24.37 kg/m  Physical Exam      Exam    General- alert and comfortable    Left Pleurx catheter exit site now healing well.  No cellulitis.    Neck- no JVD, no cervical adenopathy palpable, no carotid bruit   Lungs- clear without rales, wheezes   Cor- regular rate and rhythm, no murmur , gallop   Abdomen- soft, non-tender   Extremities - warm, non-tender, minimal edema   Neuro- oriented, appropriate, no focal weakness   Diagnostic Tests: None Impression: Resolution of erythema-cellulitis at left Pleurx catheter exit site.  He will finish his course of antibiotics and then just use regular soap and water skin care.  He will continue his current schedule for draining the right Pleurx catheter.  Plan: Return in 4 weeks with chest x-ray to assess his bilateral pleural effusion disease   Len Childs, MD [facility] [phone]

## 2020-04-25 DIAGNOSIS — H4720 Unspecified optic atrophy: Secondary | ICD-10-CM | POA: Diagnosis not present

## 2020-04-27 DIAGNOSIS — L570 Actinic keratosis: Secondary | ICD-10-CM | POA: Diagnosis not present

## 2020-04-27 DIAGNOSIS — Z85828 Personal history of other malignant neoplasm of skin: Secondary | ICD-10-CM | POA: Diagnosis not present

## 2020-04-27 DIAGNOSIS — D1801 Hemangioma of skin and subcutaneous tissue: Secondary | ICD-10-CM | POA: Diagnosis not present

## 2020-04-27 DIAGNOSIS — L821 Other seborrheic keratosis: Secondary | ICD-10-CM | POA: Diagnosis not present

## 2020-05-02 ENCOUNTER — Other Ambulatory Visit: Payer: Self-pay | Admitting: Cardiothoracic Surgery

## 2020-05-02 DIAGNOSIS — J90 Pleural effusion, not elsewhere classified: Secondary | ICD-10-CM

## 2020-05-04 ENCOUNTER — Ambulatory Visit (INDEPENDENT_AMBULATORY_CARE_PROVIDER_SITE_OTHER): Payer: Medicare Other | Admitting: Cardiothoracic Surgery

## 2020-05-04 ENCOUNTER — Ambulatory Visit
Admission: RE | Admit: 2020-05-04 | Discharge: 2020-05-04 | Disposition: A | Discharge status: Home / Self Care | Payer: Medicare Other | Source: Ambulatory Visit | Attending: Cardiothoracic Surgery | Admitting: Cardiothoracic Surgery

## 2020-05-04 ENCOUNTER — Other Ambulatory Visit: Payer: Self-pay

## 2020-05-04 ENCOUNTER — Encounter: Payer: Self-pay | Admitting: Cardiothoracic Surgery

## 2020-05-04 VITALS — BP 103/65 | HR 86 | Resp 20 | Ht 66.0 in | Wt 150.0 lb

## 2020-05-04 DIAGNOSIS — J90 Pleural effusion, not elsewhere classified: Secondary | ICD-10-CM

## 2020-05-04 DIAGNOSIS — J929 Pleural plaque without asbestos: Secondary | ICD-10-CM | POA: Diagnosis not present

## 2020-05-04 DIAGNOSIS — J984 Other disorders of lung: Secondary | ICD-10-CM | POA: Diagnosis not present

## 2020-05-04 DIAGNOSIS — J9811 Atelectasis: Secondary | ICD-10-CM | POA: Diagnosis not present

## 2020-05-04 NOTE — Progress Notes (Signed)
PCP is Wenda Low, MD Referring Provider is Wenda Low, MD  Chief Complaint  Patient presents with  . 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  The left Pleurx catheter was removed almost 2 months ago.  Chest x-ray today is reviewed showing stable mild loculated effusion on the left side with right side clear.  The patient is having some mild neuropathic pain at the site of the left Pleurx catheter removal-he is reassured that this will improve with time.    Overall the patient's exercise tolerance and breathing has been minimally changed by removal of the left Pleurx catheter.  He is still walking 1.5 miles per day rather than 2 miles, mainly because of the time constraints. His O2 saturation room air remains 97% Past Medical History:  Diagnosis Date  . Actinic keratoses   . Arthritis   . Chronic bilateral pleural effusions   . Chronic sinusitis   . Colon polyps   . Dyspnea 07/13/2013   BNP normal  . Edema 07/13/2013  . GERD (gastroesophageal reflux disease)    omeprazole 1-2 times daily  . H/O TIA (transient ischemic attack) and stroke    on statins and plavix -saw Dr.Sethi in the past  . Headache   . History of BPH   . History of TIA (transient ischemic attack) 07/13/2013  . Left inguinal hernia   . Left inguinal hernia 01/27/2019  . Lumbar radiculopathy   . Need for shingles vaccine   . Optic neuritis 10/2015   left eye  . Perennial allergic rhinitis   . Polymyalgia rheumatica ([place]) 07/13/2013   Daily prednisone  . Restless leg syndrome   . Retinal disease    right eye since birth   . Shingles   . Temporal arteritis ([place]) 10/2015  . Wears glasses   . 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 BRAVO [place] STUDY N/A 04/18/2016   Procedure: BRAVO [place];  Surgeon: Wilford Corner, MD;  Location:  [facility];  Service: Endoscopy;  Laterality: N/A;  . CHEST TUBE INSERTION Right 02/13/2018   Procedure: INSERTION PLEURAL DRAINAGE CATHETER;  Surgeon: Ivin Poot, MD;  Location: [place];  Service: Thoracic;  Laterality: Right;  . CHEST TUBE INSERTION Left 11/27/2019   Procedure: REDO INSERTION PLEURAL DRAINAGE CATHETER USING 15.5 FR. [place] CATH;  Surgeon: [name], Collier Salina, MD;  Location: [place];  Service: Thoracic;  Laterality: Left;  . COLONOSCOPY    . drropy eyelid surgery    . ESOPHAGOGASTRODUODENOSCOPY (EGD) WITH PROPOFOL N/A 04/18/2016   Procedure: ESOPHAGOGASTRODUODENOSCOPY (EGD) WITH PROPOFOL;  Surgeon: Wilford Corner, MD;  [facility];  Service: Endoscopy;  Laterality: N/A;  . INGUINAL HERNIA REPAIR Left 01/27/2019   Procedure: OPEN REPAIR LEFT INGUINAL HERNIA WITH MESH;  Surgeon: Fanny Skates, MD;  Location: [place];  Service: General;  Laterality: Left;  GENERAL AND TAP BLOCK ANESTHESIA  . INSERTION OF MESH Left 01/27/2019   Procedure: Insertion Of Mesh;  Surgeon: Fanny Skates, MD;  Location: [place];  Service: General;  Laterality: Left;  . IR THORACENTESIS ASP PLEURAL SPACE W/IMG GUIDE  02/04/2018  . IR THORACENTESIS ASP PLEURAL SPACE W/IMG GUIDE  02/06/2018  . REMOVAL OF PLEURAL DRAINAGE CATHETER Left 11/27/2019   Procedure: REMOVAL OF PLEURAL DRAINAGE CATHETER;  Surgeon: Ivin Poot, MD;  [facility]  OR;  Service: Thoracic;  Laterality: Left;  . REMOVAL OF PLEURAL DRAINAGE CATHETER Left 03/31/2020   Procedure: REMOVAL OF PLEURAL DRAINAGE CATHETER;  Surgeon: Ivin Poot, MD;  Location: [place];  Service: Thoracic;  Laterality: Left;  . RIGHT/LEFT HEART CATH AND CORONARY ANGIOGRAPHY N/A 06/24/2018   Procedure: RIGHT/LEFT HEART CATH AND CORONARY ANGIOGRAPHY;  Surgeon: Jolaine Artist, MD;  Location: [place] [facility];  Service: Cardiovascular;  Laterality: N/A;  . VASECTOMY      Family History  Problem Relation Age of Onset  . Stroke Mother   . Coronary  artery disease Mother   . Diabetes Mother   . Breast cancer Mother   . Heart attack Father   . Coronary artery disease Father   . Diabetes Father   . Graves' disease Sister     Social History Social History   Tobacco Use  . Smoking status: Former Smoker    Packs/day: 1.00    Years: 15.00    Pack years: 15.00    Types: Cigarettes    Quit date: 07/23/1975    Years since quitting: 44.8  . Smokeless tobacco: Never Used  Vaping Use  . Vaping Use: Never used  Substance Use Topics  . Alcohol use: Yes    Alcohol/week: 7.0 standard drinks    Types: 7 Glasses of wine per week    Comment: 1 glass red wine daily  . Drug use: No    Current Outpatient Medications  Medication Sig Dispense Refill  . acetaminophen (TYLENOL) 500 MG tablet Take 500 mg by mouth daily as needed for moderate pain.     Marland Kitchen aspirin EC 81 MG tablet Take 81 mg by mouth daily.    . calcium carbonate (TUMS - DOSED IN MG ELEMENTAL CALCIUM) 500 MG chewable tablet Chew 1 tablet by mouth daily as needed for indigestion.    . cetirizine (ZYRTEC) 10 MG tablet Take 10 mg by mouth daily as needed for allergies.     Marland Kitchen doxazosin (CARDURA) 8 MG tablet Take 8 mg by mouth daily.    Marland Kitchen levothyroxine (SYNTHROID) 25 MCG tablet Take 25 mcg by mouth daily.    . Multiple Vitamins-Minerals (MULTIVITAMIN WITH MINERALS) tablet Take 1 tablet by mouth daily.    . Omega-3 Fatty Acids (FISH OIL PO) Take 1 tablet by mouth daily.    . pantoprazole (PROTONIX) 20 MG tablet Take 20 mg by mouth daily.     Vladimir Faster Glycol-Propyl Glycol (SYSTANE OP) Place 1 drop into both eyes 2 (two) times daily as needed (dry eyes).    . potassium chloride SA (KLOR-CON) 20 MEQ tablet Take 3 tablets (60 mEq total) by mouth 2 (two) times daily. (Patient taking differently: Take 60 mEq by mouth 2 (two) times daily. Take 60 mg in the morning and 40 mg at bedtime) 540 tablet 3  . simvastatin (ZOCOR) 20 MG tablet Take 20 mg by mouth daily.    Marland Kitchen torsemide (DEMADEX) 20 MG  tablet Take 3 tablets (60 mg total) by mouth every morning AND 2 tablets (40 mg total) every evening. 450 tablet 3  . vitamin C (ASCORBIC ACID) 250 MG tablet Take 250 mg by mouth daily.     No current facility-administered medications for this visit.    No Known Allergies  Review of Systems   Stable exercise tolerance levels Patient will see his cardiologist Dr. [name] next month. Drainage from right Pleurx catheter remains stable at about 500 cc/week in 2 divided drainage sessions  Body weight remained stable  BP 103/65 (BP Location: Right Arm, Patient Position: Sitting)   Pulse 86   Resp 20   Ht 5\' 6"  (1.676 m)   Wt 150 lb (68 kg)   SpO2 97% Comment: RA with mask on  BMI 24.21 kg/m  Physical Exam      Exam    General- alert and comfortable    Neck- no JVD, no cervical adenopathy palpable, no carotid bruit   Lungs- clear without rales, wheezes.  Left chest incision site of prior Pleurx catheter well-healed.  Slightly tender to touch.   Cor- regular rate and rhythm, no murmur , gallop   Abdomen- soft, non-tender   Extremities - warm, non-tender, minimal edema   Neuro- oriented, appropriate, no focal weakness   Diagnostic Tests: Chest x-ray performed today personally reviewed showing the right pleural space well drained and right Pleurx catheter in good position.  The left pleural space has a mild stable loculated effusion after removal of left Pleurx catheter which was not draining any fluid  Impression: Stable now with right Pleurx catheter.  We will reduce his office visits to every other month now that he is in a stable situation with the Pleurx drainage schedule in place.  Plan: Return in mid December with chest x-ray.   Len Childs, MD [facility] [phone]

## 2020-05-05 DIAGNOSIS — Z23 Encounter for immunization: Secondary | ICD-10-CM | POA: Diagnosis not present

## 2020-05-12 DIAGNOSIS — J90 Pleural effusion, not elsewhere classified: Secondary | ICD-10-CM | POA: Diagnosis not present

## 2020-05-12 DIAGNOSIS — K219 Gastro-esophageal reflux disease without esophagitis: Secondary | ICD-10-CM | POA: Diagnosis not present

## 2020-05-12 DIAGNOSIS — M25561 Pain in right knee: Secondary | ICD-10-CM | POA: Diagnosis not present

## 2020-05-12 DIAGNOSIS — E039 Hypothyroidism, unspecified: Secondary | ICD-10-CM | POA: Diagnosis not present

## 2020-05-12 DIAGNOSIS — G459 Transient cerebral ischemic attack, unspecified: Secondary | ICD-10-CM | POA: Diagnosis not present

## 2020-05-12 DIAGNOSIS — I700 Atherosclerosis of aorta: Secondary | ICD-10-CM | POA: Diagnosis not present

## 2020-05-12 DIAGNOSIS — L605 Yellow nail syndrome: Secondary | ICD-10-CM | POA: Diagnosis not present

## 2020-05-12 DIAGNOSIS — R600 Localized edema: Secondary | ICD-10-CM | POA: Diagnosis not present

## 2020-05-16 DIAGNOSIS — J918 Pleural effusion in other conditions classified elsewhere: Secondary | ICD-10-CM | POA: Diagnosis not present

## 2020-05-27 DIAGNOSIS — M1711 Unilateral primary osteoarthritis, right knee: Secondary | ICD-10-CM | POA: Diagnosis not present

## 2020-06-03 DIAGNOSIS — M1711 Unilateral primary osteoarthritis, right knee: Secondary | ICD-10-CM | POA: Diagnosis not present

## 2020-06-03 DIAGNOSIS — M6281 Muscle weakness (generalized): Secondary | ICD-10-CM | POA: Diagnosis not present

## 2020-06-03 DIAGNOSIS — M25561 Pain in right knee: Secondary | ICD-10-CM | POA: Diagnosis not present

## 2020-06-06 DIAGNOSIS — M25561 Pain in right knee: Secondary | ICD-10-CM | POA: Diagnosis not present

## 2020-06-06 DIAGNOSIS — Z23 Encounter for immunization: Secondary | ICD-10-CM | POA: Diagnosis not present

## 2020-06-06 DIAGNOSIS — M6281 Muscle weakness (generalized): Secondary | ICD-10-CM | POA: Diagnosis not present

## 2020-06-06 DIAGNOSIS — M1711 Unilateral primary osteoarthritis, right knee: Secondary | ICD-10-CM | POA: Diagnosis not present

## 2020-06-07 ENCOUNTER — Other Ambulatory Visit: Payer: Self-pay

## 2020-06-07 ENCOUNTER — Encounter (HOSPITAL_COMMUNITY): Payer: Self-pay | Admitting: Internal Medicine

## 2020-06-07 ENCOUNTER — Ambulatory Visit (HOSPITAL_COMMUNITY)
Admission: RE | Admit: 2020-06-07 | Discharge: 2020-06-07 | Disposition: A | Discharge status: Home / Self Care | Payer: Medicare Other | Source: Ambulatory Visit | Attending: Internal Medicine | Admitting: Internal Medicine

## 2020-06-07 VITALS — BP 110/52 | HR 80 | Wt 151.6 lb

## 2020-06-07 DIAGNOSIS — Z8249 Family history of ischemic heart disease and other diseases of the circulatory system: Secondary | ICD-10-CM | POA: Insufficient documentation

## 2020-06-07 DIAGNOSIS — J90 Pleural effusion, not elsewhere classified: Secondary | ICD-10-CM | POA: Insufficient documentation

## 2020-06-07 DIAGNOSIS — L605 Yellow nail syndrome: Secondary | ICD-10-CM | POA: Diagnosis not present

## 2020-06-07 DIAGNOSIS — M353 Polymyalgia rheumatica: Secondary | ICD-10-CM | POA: Diagnosis not present

## 2020-06-07 DIAGNOSIS — Z978 Presence of other specified devices: Secondary | ICD-10-CM | POA: Diagnosis not present

## 2020-06-07 DIAGNOSIS — I5032 Chronic diastolic (congestive) heart failure: Secondary | ICD-10-CM | POA: Insufficient documentation

## 2020-06-07 DIAGNOSIS — Z7982 Long term (current) use of aspirin: Secondary | ICD-10-CM | POA: Insufficient documentation

## 2020-06-07 DIAGNOSIS — Z79899 Other long term (current) drug therapy: Secondary | ICD-10-CM | POA: Diagnosis not present

## 2020-06-07 DIAGNOSIS — Z79890 Hormone replacement therapy (postmenopausal): Secondary | ICD-10-CM | POA: Diagnosis not present

## 2020-06-07 DIAGNOSIS — R9439 Abnormal result of other cardiovascular function study: Secondary | ICD-10-CM | POA: Insufficient documentation

## 2020-06-07 DIAGNOSIS — I2510 Atherosclerotic heart disease of native coronary artery without angina pectoris: Secondary | ICD-10-CM | POA: Diagnosis not present

## 2020-06-07 DIAGNOSIS — Z87891 Personal history of nicotine dependence: Secondary | ICD-10-CM | POA: Insufficient documentation

## 2020-06-07 DIAGNOSIS — Z8673 Personal history of transient ischemic attack (TIA), and cerebral infarction without residual deficits: Secondary | ICD-10-CM | POA: Insufficient documentation

## 2020-06-07 LAB — BASIC METABOLIC PANEL
Anion gap: 6 (ref 5–15)
BUN: 19 mg/dL (ref 8–23)
CO2: 27 mmol/L (ref 22–32)
Calcium: 8.5 mg/dL — ABNORMAL LOW (ref 8.9–10.3)
Chloride: 106 mmol/L (ref 98–111)
Creatinine, Ser: 1.06 mg/dL (ref 0.61–1.24)
GFR, Estimated: >60 mL/min (ref >60)
Glucose, Bld: 114 mg/dL — ABNORMAL HIGH (ref 70–99)
Potassium: 3.9 mmol/L (ref 3.5–5.1)
Sodium: 139 mmol/L (ref 135–145)

## 2020-06-07 LAB — BRAIN NATRIURETIC PEPTIDE: B Natriuretic Peptide: 42.8 pg/mL (ref 0.0–100.0)

## 2020-06-07 MED ORDER — METOLAZONE 2.5 MG PO TABS: 2.5000 mg | ORAL_TABLET | ORAL | 3 refills | Status: DC

## 2020-06-07 MED ORDER — POTASSIUM CHLORIDE CRYS ER 20 MEQ PO TBCR: 60.0000 meq | EXTENDED_RELEASE_TABLET | Freq: Two times a day (BID) | ORAL | 3 refills | Status: DC

## 2020-06-07 NOTE — Addendum Note (Signed)
Encounter addended by: Stanford Scotland, RN on: 06/07/2020 2:43 PM  Actions taken: Charge Capture section accepted, Clinical Note Signed

## 2020-06-07 NOTE — Patient Instructions (Signed)
Start taking Metolazone 2.5 mg every Wednesday  Take extra Potassium 40 meq(2 tabs) every Wednesday when you take Metolazone  Labs done today, your results will be available in MyChart, we will contact you for abnormal readings.  Your physician recommends that you schedule a follow-up appointment in:  4 weeks  If you have any questions or concerns before your next appointment please send [place] a message through [place] or call our office at [phone].    TO LEAVE A MESSAGE FOR THE NURSE SELECT OPTION 2, PLEASE LEAVE A MESSAGE INCLUDING: . YOUR NAME . DATE OF BIRTH . CALL BACK NUMBER . REASON FOR CALL**this is important as we prioritize the call backs  [place] AS LONG AS YOU CALL BEFORE 4:00 PM   At the [facility], you and your health needs are our priority. As part of our continuing mission to provide you with exceptional heart care, we have created designated Provider Care Teams. These Care Teams include your primary Cardiologist (physician) and Advanced Practice Providers (APPs- Physician Assistants and Nurse Practitioners) who all work together to provide you with the care you need, when you need it.   You may see any of the following providers on your designated Care Team at your next follow up: Marland Kitchen Dr Glori Bickers . Dr Loralie Champagne . Darrick Grinder, NP . Lyda Jester, PA . Audry Riles, PharmD   Please be sure to bring in all your medications bottles to every appointment.

## 2020-06-07 NOTE — Addendum Note (Signed)
Encounter addended by: Stanford Scotland, RN on: 06/07/2020 2:36 PM  Actions taken: Diagnosis association updated, Order list changed

## 2020-06-07 NOTE — Addendum Note (Signed)
Encounter addended by: Stanford Scotland, RN on: 06/07/2020 2:53 PM  Actions taken: Pharmacy for encounter modified, Order list changed

## 2020-06-07 NOTE — Progress Notes (Signed)
Heart Failure Clinic Note   Date:  06/07/2020   [name], DOB [birth year], MRN [id number]  Location: Home  Provider location: [place] [facility] Type of Visit: Established patient, Add on  PCP:  Wenda Low, MD  Primary HF: Dr [name]  Chief Complaint: Fall   History of Present Illness:  Stuart Watson is an 86 y/o male with h/o temporal arteritis/polymalgia rheumatica, previous TIA/CVA, and recurrent pleural effusions s/p bilateral Pleurex tubes.  Has been following with Dr. [name]. His left Pleurex stopped draining and had to be replaced. Initially the new catheter drained fairly well but now it has stopped draining and a CT scan showed a moderate left effusion.  Dr. [name] removed the left Pleurex tube in 9/21 and CXR in 10/21 showed stable mild to moderate left effusion.   Today he returns for HF follow up. Says he is doing well. Continue to walk about 1.5 miles per day. Feels fine. Right chest tube draining Monday and Friday about 500cc total.  Taking torsemide 60/40.  No edema, orthopnea or PND.    Past Medical History:  Diagnosis Date  . Actinic keratoses   . Arthritis   . Chronic bilateral pleural effusions   . Chronic sinusitis   . Colon polyps   . Dyspnea 07/13/2013   BNP normal  . Edema 07/13/2013  . GERD (gastroesophageal reflux disease)    omeprazole 1-2 times daily  . H/O TIA (transient ischemic attack) and stroke    on statins and plavix -saw Dr.Sethi in the past  . Headache   . History of BPH   . History of TIA (transient ischemic attack) 07/13/2013  . Left inguinal hernia   . Left inguinal hernia 01/27/2019  . Lumbar radiculopathy   . Need for shingles vaccine   . Optic neuritis 10/2015   left eye  . Perennial allergic rhinitis   . Polymyalgia rheumatica ([place]) 07/13/2013   Daily prednisone  . Restless leg syndrome   . Retinal disease    right eye since birth   . Shingles   . Temporal arteritis ([place]) 10/2015  .  Wears glasses   . 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 BRAVO [place] STUDY N/A 04/18/2016   Procedure: BRAVO [place];  Surgeon: Wilford Corner, MD;  [facility];  Service: Endoscopy;  Laterality: N/A;  . CHEST TUBE INSERTION Right 02/13/2018   Procedure: INSERTION PLEURAL DRAINAGE CATHETER;  Surgeon: Ivin Poot, MD;  Location: [place];  Service: Thoracic;  Laterality: Right;  . CHEST TUBE INSERTION Left 11/27/2019   Procedure: REDO INSERTION PLEURAL DRAINAGE CATHETER USING 15.5 FR. [place] CATH;  Surgeon: [name], Collier Salina, MD;  Location: [place];  Service: Thoracic;  Laterality: Left;  . COLONOSCOPY    . drropy eyelid surgery    . ESOPHAGOGASTRODUODENOSCOPY (EGD) WITH PROPOFOL N/A 04/18/2016   Procedure: ESOPHAGOGASTRODUODENOSCOPY (EGD) WITH PROPOFOL;  Surgeon: Wilford Corner, MD;  [facility];  Service: Endoscopy;  Laterality: N/A;  . INGUINAL HERNIA REPAIR Left 01/27/2019   Procedure: OPEN REPAIR LEFT INGUINAL HERNIA WITH MESH;  Surgeon: Fanny Skates, MD;  Location: [place];  Service: General;  Laterality: Left;  GENERAL AND TAP BLOCK ANESTHESIA  . INSERTION OF MESH Left 01/27/2019   Procedure: Insertion Of Mesh;  Surgeon: Fanny Skates, MD;  Location: [place];  Service: General;  Laterality: Left;  . IR THORACENTESIS ASP PLEURAL SPACE W/IMG GUIDE  02/04/2018  . IR THORACENTESIS ASP PLEURAL SPACE W/IMG GUIDE  02/06/2018  . REMOVAL OF PLEURAL DRAINAGE CATHETER Left 11/27/2019   Procedure: REMOVAL OF PLEURAL DRAINAGE CATHETER;  Surgeon: Ivin Poot, MD;  Location: [place];  Service: Thoracic;  Laterality: Left;  . REMOVAL OF PLEURAL DRAINAGE CATHETER Left 03/31/2020   Procedure: REMOVAL OF PLEURAL DRAINAGE CATHETER;  Surgeon: Ivin Poot, MD;  Location: [place];  Service: Thoracic;  Laterality: Left;  . RIGHT/LEFT HEART CATH AND CORONARY ANGIOGRAPHY N/A 06/24/2018   Procedure: RIGHT/LEFT HEART CATH AND CORONARY ANGIOGRAPHY;   Surgeon: Jolaine Artist, MD;  Location: [place] [facility];  Service: Cardiovascular;  Laterality: N/A;  . VASECTOMY       Current Outpatient Medications  Medication Sig Dispense Refill  . acetaminophen (TYLENOL) 500 MG tablet Take 500 mg by mouth daily as needed for moderate pain.     Marland Kitchen aspirin EC 81 MG tablet Take 81 mg by mouth daily.    . calcium carbonate (TUMS - DOSED IN MG ELEMENTAL CALCIUM) 500 MG chewable tablet Chew 1 tablet by mouth daily as needed for indigestion.    . cetirizine (ZYRTEC) 10 MG tablet Take 10 mg by mouth daily as needed for allergies.     Marland Kitchen doxazosin (CARDURA) 8 MG tablet Take 8 mg by mouth daily.    . fluticasone (FLONASE) 50 MCG/ACT nasal spray Place 1 spray into both nostrils daily as needed for allergies or rhinitis.     Marland Kitchen guaiFENesin (MUCINEX) 600 MG 12 hr tablet Take 600 mg by mouth as needed for cough or to loosen phlegm.     Marland Kitchen levothyroxine (SYNTHROID) 25 MCG tablet Take 25 mcg by mouth daily.    . Multiple Vitamins-Minerals (MULTIVITAMIN WITH MINERALS) tablet Take 1 tablet by mouth daily.    . Omega-3 Fatty Acids (FISH OIL PO) Take 1 tablet by mouth every other day.     . pantoprazole (PROTONIX) 20 MG tablet Take 20 mg by mouth daily.     Vladimir Faster Glycol-Propyl Glycol (SYSTANE OP) Place 1 drop into both eyes 2 (two) times daily as needed (dry eyes).    . potassium chloride SA (KLOR-CON) 20 MEQ tablet Take 60 mEq by mouth 2 (two) times daily.    . simvastatin (ZOCOR) 20 MG tablet Take 20 mg by mouth daily.    Marland Kitchen torsemide (DEMADEX) 20 MG tablet Take 3 tablets (60 mg total) by mouth every morning AND 2 tablets (40 mg total) every evening. 450 tablet 3  . vitamin C (ASCORBIC ACID) 250 MG tablet Take 250 mg by mouth daily.     No current facility-administered medications for this encounter.    Allergies:   Patient has no known allergies.   Social History:  The patient  reports that he quit smoking about 44 years ago. His smoking use included  cigarettes. He has a 15.00 pack-year smoking history. He has never used smokeless tobacco. He reports current alcohol use of about 7.0 standard drinks of alcohol per week. He reports that he does not use drugs.   Family History:  The patient's family history includes Breast cancer in his mother; Coronary artery disease in his father and mother; Diabetes in his father and mother; Berenice Primas' disease in his sister; Heart attack in his father; Stroke in his mother.   ROS:  Please see the history of present illness.   All other systems are personally reviewed and negative.   Vitals:   06/07/20 1351  BP: Marland Kitchen)  110/52  Pulse: 80  SpO2: 98%    Exam: General:  Well appearing. No resp difficulty HEENT: normal Neck: supple. no JVD. Carotids 2+ bilat; no bruits. No lymphadenopathy or thryomegaly appreciated. Cor: PMI nondisplaced. Regular rate & rhythm. No rubs, gallops or murmurs. Lungs: clear decreased at left base  Abdomen: soft, nontender, nondistended. No hepatosplenomegaly. No bruits or masses. Good bowel sounds. Extremities: no cyanosis, clubbing, rash, 3+ chronic edema  Dystrophic nails Neuro: alert & orientedx3, cranial nerves grossly intact. moves all 4 extremities w/o difficulty. Affect pleasant   Recent Labs: 11/27/2019: Hemoglobin 11.8; Platelets 415 12/30/2019: ALT 9; B Natriuretic Peptide 35.7; BUN 25; Creatinine, Ser 1.14; Potassium 4.2; Sodium 141  Personally reviewed   Wt Readings from Last 3 Encounters:  06/07/20 68.8 kg (151 lb 9.6 oz)  05/04/20 68 kg (150 lb)  04/12/20 68.5 kg (151 lb)      ASSESSMENT AND PLAN:  1. Chronic diastolic HF - Doing well. NYHA II.  - still with residual LE edema and it is worse today suspect mostly venous insufficiency - continue torsemide 60/40 - continue potassium 25meq bid - check labs today - will add metolazone 2.5 every Wednesday with KCl 40 and see back in 1 month. Can also consider spironolactone  2. Recurrent bilateral pleural  effusions - Suspected yellow nail syndrome. - Myeloma panel with small M-spike with IgG monoclonal with kappa light chains. Likely MGUS - S/p bilat pleurex tubes  - Followed closely by Dr [name]. Left CT removed with stable small to moderate left effusion. R Pleurex working well  - We considered somatostatin but they have decided against it for now  3. Coronary calcifications and abnormal stress test - No s/s ischemia - Left heart cath 12/19 minimal CAD  Signed, Glori Bickers, MD  06/07/2020 2:01 PM  [place] [address] [facility] [place] [address] [phone] (office) [phone] (fax)

## 2020-06-09 DIAGNOSIS — M25561 Pain in right knee: Secondary | ICD-10-CM | POA: Diagnosis not present

## 2020-06-09 DIAGNOSIS — M6281 Muscle weakness (generalized): Secondary | ICD-10-CM | POA: Diagnosis not present

## 2020-06-09 DIAGNOSIS — M1711 Unilateral primary osteoarthritis, right knee: Secondary | ICD-10-CM | POA: Diagnosis not present

## 2020-06-15 DIAGNOSIS — M6281 Muscle weakness (generalized): Secondary | ICD-10-CM | POA: Diagnosis not present

## 2020-06-15 DIAGNOSIS — M25561 Pain in right knee: Secondary | ICD-10-CM | POA: Diagnosis not present

## 2020-06-15 DIAGNOSIS — M1711 Unilateral primary osteoarthritis, right knee: Secondary | ICD-10-CM | POA: Diagnosis not present

## 2020-06-22 DIAGNOSIS — M25561 Pain in right knee: Secondary | ICD-10-CM | POA: Diagnosis not present

## 2020-06-22 DIAGNOSIS — M6281 Muscle weakness (generalized): Secondary | ICD-10-CM | POA: Diagnosis not present

## 2020-06-22 DIAGNOSIS — M1711 Unilateral primary osteoarthritis, right knee: Secondary | ICD-10-CM | POA: Diagnosis not present

## 2020-06-24 DIAGNOSIS — M1711 Unilateral primary osteoarthritis, right knee: Secondary | ICD-10-CM | POA: Diagnosis not present

## 2020-07-03 NOTE — Progress Notes (Signed)
Heart Failure Clinic Note   Date:  07/04/2020   [name], DOB [birth year], MRN [id number]  Location: Home  Provider location: [facility] Type of Visit: Established patient, Add on  PCP:  Wenda Low, MD  Primary HF: Stuart Watson  Chief Complaint: Fall   History of Present Illness:  Stuart Watson is an 86 y/o male with h/o temporal arteritis/polymalgia rheumatica, previous TIA/CVA, and recurrent pleural effusions s/p bilateral Pleurex tubes.  Has been following with Stuart Watson. His left Pleurex stopped draining and had to be replaced. Initially the new catheter drained fairly well but now it has stopped draining and a CT scan showed a moderate left effusion.  Stuart Watson removed the left Pleurex tube in 9/21 and CXR in 10/21 showed stable mild to moderate left effusion.   We saw him last month and was still volume overloaded. We added metolazone once a week (wednesdays). He is here today for f/u with his wife. Says after the first dose of metolazone lost about 8 pounds and has gained about 3 pounds back but has been taking metolazone once a week and then weight goes back down. Edema much improved and drainage from Pleurex has dropped but does c/o dry mouth. No orthopnea or PND.   Past Medical History:  Diagnosis Date  . Actinic keratoses   . Arthritis   . CHF (congestive heart failure) ([place])   . Chronic bilateral pleural effusions   . Chronic sinusitis   . Colon polyps   . Dyspnea 07/13/2013   BNP normal  . Edema 07/13/2013  . GERD (gastroesophageal reflux disease)    omeprazole 1-2 times daily  . H/O TIA (transient ischemic attack) and stroke    on statins and plavix -saw Stuart Watson in the past  . Headache   . History of BPH   . History of TIA (transient ischemic attack) 07/13/2013  . Left inguinal hernia   . Left inguinal hernia 01/27/2019  . Lumbar radiculopathy   . Need for shingles vaccine   . Optic neuritis 10/2015   left eye  .  Perennial allergic rhinitis   . Polymyalgia rheumatica ([place]) 07/13/2013   Daily prednisone  . Restless leg syndrome   . Retinal disease    right eye since birth   . Shingles   . Temporal arteritis ([place]) 10/2015  . Wears glasses   . 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 BRAVO [place] STUDY N/A 04/18/2016   Procedure: BRAVO [place];  Surgeon: Wilford Corner, MD;  Location: [facility];  Service: Endoscopy;  Laterality: N/A;  . CHEST TUBE INSERTION Right 02/13/2018   Procedure: INSERTION PLEURAL DRAINAGE CATHETER;  Surgeon: Ivin Poot, MD;  Location: [facility];  Service: Thoracic;  Laterality: Right;  . CHEST TUBE INSERTION Left 11/27/2019   Procedure: REDO INSERTION PLEURAL DRAINAGE CATHETER USING 15.5 FR. [place] CATH;  Surgeon: Prescott Watson, Collier Salina, MD;  Location: [facility];  Service: Thoracic;  Laterality: Left;  . COLONOSCOPY    . drropy eyelid surgery    . ESOPHAGOGASTRODUODENOSCOPY (EGD) WITH PROPOFOL N/A 04/18/2016   Procedure: ESOPHAGOGASTRODUODENOSCOPY (EGD) WITH PROPOFOL;  Surgeon: Wilford Corner, MD;  Location: [facility];  Service: Endoscopy;  Laterality: N/A;  . INGUINAL HERNIA REPAIR Left 01/27/2019   Procedure: OPEN REPAIR LEFT INGUINAL HERNIA WITH MESH;  Surgeon: Fanny Skates, MD;  Location: [facility];  Service: General;  Laterality: Left;  GENERAL  AND TAP BLOCK ANESTHESIA  . INSERTION OF MESH Left 01/27/2019   Procedure: Insertion Of Mesh;  Surgeon: Fanny Skates, MD;  Location: [facility];  Service: General;  Laterality: Left;  . IR THORACENTESIS ASP PLEURAL SPACE W/IMG GUIDE  02/04/2018  . IR THORACENTESIS ASP PLEURAL SPACE W/IMG GUIDE  02/06/2018  . REMOVAL OF PLEURAL DRAINAGE CATHETER Left 11/27/2019   Procedure: REMOVAL OF PLEURAL DRAINAGE CATHETER;  Surgeon: Ivin Poot, MD;  Location: [facility];  Service: Thoracic;  Laterality: Left;  . REMOVAL OF PLEURAL DRAINAGE CATHETER Left 03/31/2020   Procedure: REMOVAL OF PLEURAL DRAINAGE CATHETER;   Surgeon: Ivin Poot, MD;  Location: [facility];  Service: Thoracic;  Laterality: Left;  . RIGHT/LEFT HEART CATH AND CORONARY ANGIOGRAPHY N/A 06/24/2018   Procedure: RIGHT/LEFT HEART CATH AND CORONARY ANGIOGRAPHY;  Surgeon: Jolaine Artist, MD;  Location: [facility];  Service: Cardiovascular;  Laterality: N/A;  . VASECTOMY       Current Outpatient Medications  Medication Sig Dispense Refill  . acetaminophen (TYLENOL) 500 MG tablet Take 500 mg by mouth daily as needed for moderate pain.     Marland Kitchen aspirin EC 81 MG tablet Take 81 mg by mouth daily.    . calcium carbonate (TUMS - DOSED IN MG ELEMENTAL CALCIUM) 500 MG chewable tablet Chew 1 tablet by mouth daily as needed for indigestion.    . cetirizine (ZYRTEC) 10 MG tablet Take 10 mg by mouth daily as needed for allergies.     . cholecalciferol (VITAMIN D3) 25 MCG (1000 UNIT) tablet Take 1,000 Units by mouth daily.    Marland Kitchen doxazosin (CARDURA) 8 MG tablet Take 8 mg by mouth daily.    . fluticasone (FLONASE) 50 MCG/ACT nasal spray Place 1 spray into both nostrils daily as needed for allergies or rhinitis.     Marland Kitchen guaiFENesin (MUCINEX) 600 MG 12 hr tablet Take 600 mg by mouth as needed for cough or to loosen phlegm.     Marland Kitchen levothyroxine (SYNTHROID) 25 MCG tablet Take 25 mcg by mouth daily.    . metolazone (ZAROXOLYN) 2.5 MG tablet Take 1 tablet (2.5 mg total) by mouth every Wednesday. 20 tablet 3  . Omega-3 Fatty Acids (FISH OIL PO) Take 1 tablet by mouth every other day.     . pantoprazole (PROTONIX) 20 MG tablet Take 20 mg by mouth daily.     Vladimir Faster Glycol-Propyl Glycol (SYSTANE OP) Place 1 drop into both eyes 2 (two) times daily as needed (dry eyes).    . potassium chloride SA (KLOR-CON) 20 MEQ tablet Take 3 tablets (60 mEq total) by mouth 2 (two) times daily. Take extra 2 tabs when you take Metolazone 570 tablet 3  . simvastatin (ZOCOR) 20 MG tablet Take 20 mg by mouth daily.    Marland Kitchen torsemide (DEMADEX) 20 MG tablet Take 3 tablets (60 mg  total) by mouth every morning AND 2 tablets (40 mg total) every evening. 450 tablet 3  . vitamin C (ASCORBIC ACID) 250 MG tablet Take 250 mg by mouth daily.     No current facility-administered medications for this encounter.    Allergies:   Patient has no known allergies.   Social History:  The patient  reports that he quit smoking about 44 years ago. His smoking use included cigarettes. He has a 15.00 pack-year smoking history. He has never used smokeless tobacco. He reports current alcohol use of about 7.0 standard drinks of alcohol per week. He reports that he does  not use drugs.   Family History:  The patient's family history includes Breast cancer in his mother; Coronary artery disease in his father and mother; Diabetes in his father and mother; Berenice Primas' disease in his sister; Heart attack in his father; Stroke in his mother.   ROS:  Please see the history of present illness.   All other systems are personally reviewed and negative.   Vitals:   07/04/20 1529  BP: 128/70  Pulse: 64  SpO2: 99%    Exam: General:  Well appearing. No resp difficulty HEENT: normal Neck: supple. no JVD. Carotids 2+ bilat; no bruits. No lymphadenopathy or thryomegaly appreciated. Cor: PMI nondisplaced. Regular rate & rhythm. No rubs, gallops or murmurs. Lungs: clear Abdomen: soft, nontender, nondistended. No hepatosplenomegaly. No bruits or masses. Good bowel sounds. Extremities: no cyanosis, clubbing, rash, trace edema with chronic venous stasis changes. Dystrophic nails.  Neuro: alert & orientedx3, cranial nerves grossly intact. moves all 4 extremities w/o difficulty. Affect pleasan   Recent Labs: 11/27/2019: Hemoglobin 11.8; Platelets 415 12/30/2019: ALT 9 07/04/2020: B Natriuretic Peptide 52.3; BUN 27; Creatinine, Ser 1.07; Potassium 4.2; Sodium 142  Personally reviewed   Wt Readings from Last 3 Encounters:  07/04/20 66.6 kg (146 lb 12.8 oz)  06/07/20 68.8 kg (151 lb 9.6 oz)  05/04/20 68 kg (150  lb)      ASSESSMENT AND PLAN:  1. Chronic diastolic HF - Stable. NYHA II.  - volume status improved with weekly metolazone but he gets quite thirsty. Check labs.  Can space metolazone out to q 10 days as needed.  - continue torsemide 60/40 - continue potassium 78meq bid - Can also consider spironolactone  2. 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 - Myeloma panel with small M-spike with IgG monoclonal with kappa light chains. Likely MGUS - S/p bilat pleurex tubes  - Followed closely by Stuart Prescott Watson. Left CT removed with stable small to moderate left effusion. R Pleurex working well. Drainage now decreased with metolazone use - We considered somatostatin but they have decided against it for now  3. Coronary calcifications and abnormal stress test - Left heart cath 12/19 minimal CAD - no s/s ischemia  Signed, Glori Bickers, MD  07/04/2020 10:38 PM  [place] [address] [facility] [place] [address] [phone] (office) [phone] (fax)

## 2020-07-04 ENCOUNTER — Other Ambulatory Visit: Payer: Self-pay

## 2020-07-04 ENCOUNTER — Ambulatory Visit (HOSPITAL_COMMUNITY)
Admission: RE | Admit: 2020-07-04 | Discharge: 2020-07-04 | Disposition: A | Discharge status: Home / Self Care | Payer: Medicare Other | Source: Ambulatory Visit | Attending: Internal Medicine | Admitting: Internal Medicine

## 2020-07-04 ENCOUNTER — Encounter (HOSPITAL_COMMUNITY): Payer: Self-pay | Admitting: Internal Medicine

## 2020-07-04 VITALS — BP 128/70 | HR 64 | Wt 146.8 lb

## 2020-07-04 DIAGNOSIS — I5032 Chronic diastolic (congestive) heart failure: Secondary | ICD-10-CM | POA: Diagnosis not present

## 2020-07-04 DIAGNOSIS — J90 Pleural effusion, not elsewhere classified: Secondary | ICD-10-CM | POA: Diagnosis not present

## 2020-07-04 DIAGNOSIS — Z8673 Personal history of transient ischemic attack (TIA), and cerebral infarction without residual deficits: Secondary | ICD-10-CM | POA: Diagnosis not present

## 2020-07-04 DIAGNOSIS — I5022 Chronic systolic (congestive) heart failure: Secondary | ICD-10-CM | POA: Diagnosis not present

## 2020-07-04 DIAGNOSIS — Z79899 Other long term (current) drug therapy: Secondary | ICD-10-CM | POA: Diagnosis not present

## 2020-07-04 DIAGNOSIS — L605 Yellow nail syndrome: Secondary | ICD-10-CM | POA: Diagnosis not present

## 2020-07-04 DIAGNOSIS — Z8249 Family history of ischemic heart disease and other diseases of the circulatory system: Secondary | ICD-10-CM | POA: Insufficient documentation

## 2020-07-04 DIAGNOSIS — I2510 Atherosclerotic heart disease of native coronary artery without angina pectoris: Secondary | ICD-10-CM | POA: Diagnosis not present

## 2020-07-04 DIAGNOSIS — Z7982 Long term (current) use of aspirin: Secondary | ICD-10-CM | POA: Diagnosis not present

## 2020-07-04 DIAGNOSIS — Z87891 Personal history of nicotine dependence: Secondary | ICD-10-CM | POA: Diagnosis not present

## 2020-07-04 HISTORY — DX: Heart failure, unspecified: I50.9

## 2020-07-04 LAB — BASIC METABOLIC PANEL
Anion gap: 12 (ref 5–15)
BUN: 27 mg/dL — ABNORMAL HIGH (ref 8–23)
CO2: 25 mmol/L (ref 22–32)
Calcium: 8.6 mg/dL — ABNORMAL LOW (ref 8.9–10.3)
Chloride: 105 mmol/L (ref 98–111)
Creatinine, Ser: 1.07 mg/dL (ref 0.61–1.24)
GFR, Estimated: >60 mL/min (ref >60)
Glucose, Bld: 105 mg/dL — ABNORMAL HIGH (ref 70–99)
Potassium: 4.2 mmol/L (ref 3.5–5.1)
Sodium: 142 mmol/L (ref 135–145)

## 2020-07-04 LAB — BRAIN NATRIURETIC PEPTIDE: B Natriuretic Peptide: 52.3 pg/mL (ref 0.0–100.0)

## 2020-07-04 NOTE — Addendum Note (Signed)
Encounter addended by: Jolaine Artist, MD on: 07/04/2020 10:41 PM  Actions taken: Level of Service modified, Visit diagnoses modified

## 2020-07-04 NOTE — Patient Instructions (Signed)
Labs done today, your results will be available in MyChart, we will contact you for abnormal readings.   Your physician recommends that you schedule a follow-up appointment in: 1 month for lab work  Follow up in 4 months Please call our office to schedule an appointment.  If you have any questions or concerns before your next appointment please send [place] a message through [place] or call our office at [phone].    TO LEAVE A MESSAGE FOR THE NURSE SELECT OPTION 2, PLEASE LEAVE A MESSAGE INCLUDING:  YOUR NAME  DATE OF BIRTH  CALL BACK NUMBER  REASON FOR CALL**this is important as we prioritize the call backs  YOU WILL RECEIVE A CALL BACK THE SAME DAY AS LONG AS YOU CALL BEFORE 4:00 PM  At the [facility], you and your health needs are our priority. As part of our continuing mission to provide you with exceptional heart care, we have created designated Provider Care Teams. These Care Teams include your primary Cardiologist (physician) and Advanced Practice Providers (APPs- Physician Assistants and Nurse Practitioners) who all work together to provide you with the care you need, when you need it.   You may see any of the following providers on your designated Care Team at your next follow up:  Dr Glori Bickers  Dr Haynes Kerns, NP  Lyda Jester, [place]  Audry Riles, PharmD   Please be sure to bring in all your medications bottles to every appointment.

## 2020-07-06 ENCOUNTER — Other Ambulatory Visit: Payer: Self-pay | Admitting: Cardiothoracic Surgery

## 2020-07-06 ENCOUNTER — Ambulatory Visit: Payer: Medicare Other | Admitting: Cardiothoracic Surgery

## 2020-07-06 DIAGNOSIS — J942 Hemothorax: Secondary | ICD-10-CM

## 2020-07-06 DIAGNOSIS — J90 Pleural effusion, not elsewhere classified: Secondary | ICD-10-CM

## 2020-07-13 ENCOUNTER — Telehealth: Payer: Self-pay | Admitting: Cardiothoracic Surgery

## 2020-07-13 ENCOUNTER — Ambulatory Visit
Admission: RE | Admit: 2020-07-13 | Discharge: 2020-07-13 | Disposition: A | Discharge status: Home / Self Care | Payer: Medicare Other | Source: Ambulatory Visit | Attending: Cardiothoracic Surgery | Admitting: Cardiothoracic Surgery

## 2020-07-13 ENCOUNTER — Other Ambulatory Visit: Payer: Self-pay

## 2020-07-13 ENCOUNTER — Telehealth: Payer: Medicare Other | Admitting: Cardiothoracic Surgery

## 2020-07-13 DIAGNOSIS — J942 Hemothorax: Secondary | ICD-10-CM

## 2020-07-13 DIAGNOSIS — J929 Pleural plaque without asbestos: Secondary | ICD-10-CM | POA: Diagnosis not present

## 2020-07-13 DIAGNOSIS — J90 Pleural effusion, not elsewhere classified: Secondary | ICD-10-CM

## 2020-07-13 DIAGNOSIS — J9811 Atelectasis: Secondary | ICD-10-CM | POA: Diagnosis not present

## 2020-07-13 DIAGNOSIS — I509 Heart failure, unspecified: Secondary | ICD-10-CM | POA: Diagnosis not present

## 2020-07-13 NOTE — Telephone Encounter (Signed)
[address]       [place] [address]             [phone]     CARDIOTHORACIC SURGERY TELEPHONE VIRTUAL OFFICE NOTE  Referring Provider is No ref. provider found Primary Cardiologist is No primary care provider on file. PCP is Georgann Housekeeper, MD   HPI:  I spoke with Stuart Watson (DOB [birth year] ) via telephone on 07/13/2020 at 1:52 PM and verified that I was speaking with the correct person using more than one form of identification.  We discussed the reason(s) for conducting our visit virtually instead of in-person.  The patient expressed understanding the circumstances and agreed to proceed as described.   regular 2 month followup with CXR for R pleurx cath placed 2019 for " yellow nail syndrome" CXR today personally reviewed with clear R pleural space. The left pleural space fluid isvery minimal and improved since the last visit  Current Outpatient Medications  Medication Sig Dispense Refill  . acetaminophen (TYLENOL) 500 MG tablet Take 500 mg by mouth daily as needed for moderate pain.     Marland Kitchen aspirin EC 81 MG tablet Take 81 mg by mouth daily.    . calcium carbonate (TUMS - DOSED IN MG ELEMENTAL CALCIUM) 500 MG chewable tablet Chew 1 tablet by mouth daily as needed for indigestion.    . cetirizine (ZYRTEC) 10 MG tablet Take 10 mg by mouth daily as needed for allergies.     . cholecalciferol (VITAMIN D3) 25 MCG (1000 UNIT) tablet Take 1,000 Units by mouth daily.    Marland Kitchen doxazosin (CARDURA) 8 MG tablet Take 8 mg by mouth daily.    . fluticasone (FLONASE) 50 MCG/ACT nasal spray Place 1 spray into both nostrils daily as needed for allergies or rhinitis.     Marland Kitchen guaiFENesin (MUCINEX) 600 MG 12 hr tablet Take 600 mg by mouth as needed for cough or to loosen phlegm.     Marland Kitchen levothyroxine (SYNTHROID) 25 MCG tablet Take 25 mcg by mouth daily.    . metolazone (ZAROXOLYN) 2.5 MG tablet Take 1 tablet (2.5 mg total) by mouth every Wednesday. 20 tablet 3  . Omega-3 Fatty  Acids (FISH OIL PO) Take 1 tablet by mouth every other day.     . pantoprazole (PROTONIX) 20 MG tablet Take 20 mg by mouth daily.     Bertram Gala Glycol-Propyl Glycol (SYSTANE OP) Place 1 drop into both eyes 2 (two) times daily as needed (dry eyes).    . potassium chloride SA (KLOR-CON) 20 MEQ tablet Take 3 tablets (60 mEq total) by mouth 2 (two) times daily. Take extra 2 tabs when you take Metolazone 570 tablet 3  . simvastatin (ZOCOR) 20 MG tablet Take 20 mg by mouth daily.    Marland Kitchen torsemide (DEMADEX) 20 MG tablet Take 3 tablets (60 mg total) by mouth every morning AND 2 tablets (40 mg total) every evening. 450 tablet 3  . vitamin C (ASCORBIC ACID) 250 MG tablet Take 250 mg by mouth daily.     No current facility-administered medications for this visit.     Diagnostic Tests:  CXR today personally reviewed   Impression:  R pleural effusion well drained with Pleurx  Plan:  Continue current drainage schedule andreturn for CXR in  months    I discussed limitations of evaluation and management via telephone.  The patient was advised to call back for repeat telephone consultation or to seek an in-person evaluation  if questions arise or the patient's clinical condition changes in any significant manner.  I spent in excess of  31minutes of non-face-to-face time during the conduct of this telephone virtual office consultation.  Level 1  (99441)             5-10 minutes Level 2  (99442)            11-20 minutes Level 3  (99443)            21-30 minutes   07/13/2020 1:52 PM

## 2020-07-27 DIAGNOSIS — J918 Pleural effusion in other conditions classified elsewhere: Secondary | ICD-10-CM | POA: Diagnosis not present

## 2020-08-04 ENCOUNTER — Other Ambulatory Visit: Payer: Self-pay

## 2020-08-04 ENCOUNTER — Ambulatory Visit (HOSPITAL_COMMUNITY)
Admission: RE | Admit: 2020-08-04 | Discharge: 2020-08-04 | Disposition: A | Discharge status: Home / Self Care | Payer: Medicare Other | Source: Ambulatory Visit | Attending: Internal Medicine | Admitting: Internal Medicine

## 2020-08-04 DIAGNOSIS — I5022 Chronic systolic (congestive) heart failure: Secondary | ICD-10-CM | POA: Diagnosis not present

## 2020-08-04 LAB — BASIC METABOLIC PANEL
Anion gap: 10 (ref 5–15)
BUN: 36 mg/dL — ABNORMAL HIGH (ref 8–23)
CO2: 30 mmol/L (ref 22–32)
Calcium: 8.6 mg/dL — ABNORMAL LOW (ref 8.9–10.3)
Chloride: 99 mmol/L (ref 98–111)
Creatinine, Ser: 1.36 mg/dL — ABNORMAL HIGH (ref 0.61–1.24)
GFR, Estimated: 51 mL/min — ABNORMAL LOW (ref >60)
Glucose, Bld: 122 mg/dL — ABNORMAL HIGH (ref 70–99)
Potassium: 3.9 mmol/L (ref 3.5–5.1)
Sodium: 139 mmol/L (ref 135–145)

## 2020-08-15 DIAGNOSIS — E039 Hypothyroidism, unspecified: Secondary | ICD-10-CM | POA: Diagnosis not present

## 2020-08-15 DIAGNOSIS — Z0000 Encounter for general adult medical examination without abnormal findings: Secondary | ICD-10-CM | POA: Diagnosis not present

## 2020-08-15 DIAGNOSIS — M353 Polymyalgia rheumatica: Secondary | ICD-10-CM | POA: Diagnosis not present

## 2020-08-15 DIAGNOSIS — R7309 Other abnormal glucose: Secondary | ICD-10-CM | POA: Diagnosis not present

## 2020-08-15 DIAGNOSIS — J309 Allergic rhinitis, unspecified: Secondary | ICD-10-CM | POA: Diagnosis not present

## 2020-08-15 DIAGNOSIS — E7800 Pure hypercholesterolemia, unspecified: Secondary | ICD-10-CM | POA: Diagnosis not present

## 2020-08-15 DIAGNOSIS — J90 Pleural effusion, not elsewhere classified: Secondary | ICD-10-CM | POA: Diagnosis not present

## 2020-08-15 DIAGNOSIS — I700 Atherosclerosis of aorta: Secondary | ICD-10-CM | POA: Diagnosis not present

## 2020-08-15 DIAGNOSIS — Z1389 Encounter for screening for other disorder: Secondary | ICD-10-CM | POA: Diagnosis not present

## 2020-08-15 DIAGNOSIS — L605 Yellow nail syndrome: Secondary | ICD-10-CM | POA: Diagnosis not present

## 2020-08-15 DIAGNOSIS — R972 Elevated prostate specific antigen [PSA]: Secondary | ICD-10-CM | POA: Diagnosis not present

## 2020-08-15 DIAGNOSIS — N400 Enlarged prostate without lower urinary tract symptoms: Secondary | ICD-10-CM | POA: Diagnosis not present

## 2020-08-15 DIAGNOSIS — G459 Transient cerebral ischemic attack, unspecified: Secondary | ICD-10-CM | POA: Diagnosis not present

## 2020-08-15 DIAGNOSIS — K219 Gastro-esophageal reflux disease without esophagitis: Secondary | ICD-10-CM | POA: Diagnosis not present

## 2020-09-21 DIAGNOSIS — J918 Pleural effusion in other conditions classified elsewhere: Secondary | ICD-10-CM | POA: Diagnosis not present

## 2020-09-28 ENCOUNTER — Other Ambulatory Visit: Payer: Self-pay | Admitting: Thoracic Surgery (Cardiothoracic Vascular Surgery)

## 2020-09-28 DIAGNOSIS — J90 Pleural effusion, not elsewhere classified: Secondary | ICD-10-CM

## 2020-09-30 ENCOUNTER — Other Ambulatory Visit: Payer: Self-pay

## 2020-09-30 ENCOUNTER — Ambulatory Visit
Admission: RE | Admit: 2020-09-30 | Discharge: 2020-09-30 | Disposition: A | Discharge status: Home / Self Care | Payer: Medicare Other | Source: Ambulatory Visit | Attending: Thoracic Surgery (Cardiothoracic Vascular Surgery) | Admitting: Thoracic Surgery (Cardiothoracic Vascular Surgery)

## 2020-09-30 ENCOUNTER — Encounter: Payer: Self-pay | Admitting: Thoracic Surgery (Cardiothoracic Vascular Surgery)

## 2020-09-30 ENCOUNTER — Ambulatory Visit (INDEPENDENT_AMBULATORY_CARE_PROVIDER_SITE_OTHER): Payer: Medicare Other | Admitting: Thoracic Surgery (Cardiothoracic Vascular Surgery)

## 2020-09-30 VITALS — BP 122/65 | HR 81 | Resp 20 | Ht 66.0 in | Wt 142.0 lb

## 2020-09-30 DIAGNOSIS — J90 Pleural effusion, not elsewhere classified: Secondary | ICD-10-CM

## 2020-09-30 DIAGNOSIS — J9811 Atelectasis: Secondary | ICD-10-CM | POA: Diagnosis not present

## 2020-09-30 DIAGNOSIS — I2510 Atherosclerotic heart disease of native coronary artery without angina pectoris: Secondary | ICD-10-CM | POA: Diagnosis not present

## 2020-09-30 NOTE — Progress Notes (Signed)
     [address]       [place] [address]             [phone]       87 year old male with history of yellow nail syndrome, right Pleurx catheter placement for chronic pleural effusion presents today in follow-up.  He was previously followed by Dr. Darcey Nora.  He comes in today with minimal complaints.  He denies any shortness of breath but states that this is related to when he takes his Zaroxolyn.  He continues to drain his Pleurx catheter twice weekly.  He usually gets between 200-300 mL with each drainage.  Vitals:   09/30/20 1119  BP: 122/65  Pulse: 81  Resp: 20  SpO2: 97%   Alert NAD Easy work of breathing Warm extremities.  87 year old male with history of yellow nail syndrome that was treated with bilateral Pleurx catheters by Dr. Darcey Nora.  The left-sided catheter has been removed and is only draining on the right now.  I reviewed his chest x-ray and his left pleural space is clear.  The catheter is in good position.  He has a loculated left-sided effusion.  His respiratory status is stable.  Given that he is draining about 200 mL of fluid with each drainage I recommended that he decrease his frequency to once a week.  I will see him back in follow-up for virtual visit to assess the status of his drainage.

## 2020-10-21 ENCOUNTER — Other Ambulatory Visit (HOSPITAL_COMMUNITY): Payer: Self-pay | Admitting: Internal Medicine

## 2020-11-03 ENCOUNTER — Encounter: Payer: Self-pay | Admitting: Thoracic Surgery (Cardiothoracic Vascular Surgery)

## 2020-11-03 ENCOUNTER — Other Ambulatory Visit: Payer: Self-pay | Admitting: *Deleted

## 2020-11-03 ENCOUNTER — Other Ambulatory Visit: Payer: Self-pay

## 2020-11-03 ENCOUNTER — Telehealth (INDEPENDENT_AMBULATORY_CARE_PROVIDER_SITE_OTHER): Payer: Medicare Other | Admitting: Thoracic Surgery (Cardiothoracic Vascular Surgery)

## 2020-11-03 DIAGNOSIS — J90 Pleural effusion, not elsewhere classified: Secondary | ICD-10-CM

## 2020-11-03 NOTE — Progress Notes (Signed)
     [address]       [place] [address]             [phone]       Patient: Home Provider: Office Consent for Telemedicine visit obtained.  Today's visit was completed via a real-time telehealth (see specific modality noted below). The patient/authorized person provided oral consent at the time of the visit to engage in a telemedicine encounter with the present provider at [facility]. The patient/authorized person was informed of the potential benefits, limitations, and risks of telemedicine. The patient/authorized person expressed understanding that the laws that protect confidentiality also apply to telemedicine. The patient/authorized person acknowledged understanding that telemedicine does not provide emergency services and that he or she would need to call 911 or proceed to the nearest hospital for help if such a need arose.  . Total time spent in the clinical discussion 10 minutes. . Telehealth Modality: Phone visit (audio only)  I had a telephone visit with Stuart Watson and his wife.  They have transition to draining of the right-sided Pleurx catheter once a week.  For the last 3 weeks he has put out between 3-400.  He denies any shortness of breath.  He continues to have issues with energy based off of when he takes some of his medications.  Of instructed them that in 2 weeks we will get a CT scan of the chest prior to their weekly drainage to assess the amount of fluid that is within the chest.  On review of his chest x-ray his right pleural space looks pretty well drained however.  We will follow up with him in 2 weeks as a virtual visit to review their CT scan.

## 2020-11-04 ENCOUNTER — Telehealth: Payer: Medicare Other | Admitting: Thoracic Surgery (Cardiothoracic Vascular Surgery)

## 2020-11-09 ENCOUNTER — Other Ambulatory Visit: Payer: Self-pay

## 2020-11-09 ENCOUNTER — Encounter (HOSPITAL_COMMUNITY): Payer: Self-pay | Admitting: Internal Medicine

## 2020-11-09 ENCOUNTER — Ambulatory Visit (HOSPITAL_COMMUNITY)
Admission: RE | Admit: 2020-11-09 | Discharge: 2020-11-09 | Disposition: A | Discharge status: Home / Self Care | Payer: Medicare Other | Source: Ambulatory Visit | Attending: Internal Medicine | Admitting: Internal Medicine

## 2020-11-09 VITALS — BP 110/60 | HR 67 | Wt 146.8 lb

## 2020-11-09 DIAGNOSIS — R011 Cardiac murmur, unspecified: Secondary | ICD-10-CM | POA: Diagnosis not present

## 2020-11-09 DIAGNOSIS — J90 Pleural effusion, not elsewhere classified: Secondary | ICD-10-CM

## 2020-11-09 DIAGNOSIS — Z7982 Long term (current) use of aspirin: Secondary | ICD-10-CM | POA: Diagnosis not present

## 2020-11-09 DIAGNOSIS — Z87891 Personal history of nicotine dependence: Secondary | ICD-10-CM | POA: Diagnosis not present

## 2020-11-09 DIAGNOSIS — Z8249 Family history of ischemic heart disease and other diseases of the circulatory system: Secondary | ICD-10-CM | POA: Diagnosis not present

## 2020-11-09 DIAGNOSIS — L605 Yellow nail syndrome: Secondary | ICD-10-CM | POA: Diagnosis not present

## 2020-11-09 DIAGNOSIS — I5032 Chronic diastolic (congestive) heart failure: Secondary | ICD-10-CM | POA: Insufficient documentation

## 2020-11-09 DIAGNOSIS — Z8673 Personal history of transient ischemic attack (TIA), and cerebral infarction without residual deficits: Secondary | ICD-10-CM | POA: Diagnosis not present

## 2020-11-09 DIAGNOSIS — Z79899 Other long term (current) drug therapy: Secondary | ICD-10-CM | POA: Insufficient documentation

## 2020-11-09 DIAGNOSIS — I2510 Atherosclerotic heart disease of native coronary artery without angina pectoris: Secondary | ICD-10-CM | POA: Insufficient documentation

## 2020-11-09 LAB — BASIC METABOLIC PANEL
Anion gap: 8 (ref 5–15)
BUN: 24 mg/dL — ABNORMAL HIGH (ref 8–23)
CO2: 29 mmol/L (ref 22–32)
Calcium: 8.5 mg/dL — ABNORMAL LOW (ref 8.9–10.3)
Chloride: 102 mmol/L (ref 98–111)
Creatinine, Ser: 1.22 mg/dL (ref 0.61–1.24)
GFR, Estimated: 58 mL/min — ABNORMAL LOW (ref >60)
Glucose, Bld: 92 mg/dL (ref 70–99)
Potassium: 4.5 mmol/L (ref 3.5–5.1)
Sodium: 139 mmol/L (ref 135–145)

## 2020-11-09 LAB — BRAIN NATRIURETIC PEPTIDE: B Natriuretic Peptide: 44.2 pg/mL (ref 0.0–100.0)

## 2020-11-09 MED ORDER — SPIRONOLACTONE 25 MG PO TABS: 12.5000 mg | ORAL_TABLET | Freq: Every day | ORAL | 6 refills | Status: DC

## 2020-11-09 MED ORDER — POTASSIUM CHLORIDE CRYS ER 20 MEQ PO TBCR: 60.0000 meq | EXTENDED_RELEASE_TABLET | Freq: Every day | ORAL | 3 refills | Status: DC

## 2020-11-09 NOTE — Patient Instructions (Signed)
Start Spironolactone 12.5 mg (1/2 tab) Daily  Decrease Potassium to 60 meq (3 tabs) Daily, still take extra when you take Metolazone  Labs done today, your results will be available in MyChart, we will contact you for abnormal readings.  Your physician recommends that you return for lab work in: 2 weeks  Your physician recommends that you schedule a follow-up appointment in: 4 months with an echocardiogram  If you have any questions or concerns before your next appointment please send [place] a message through [place] or call our office at [phone].    TO LEAVE A MESSAGE FOR THE NURSE SELECT OPTION 2, PLEASE LEAVE A MESSAGE INCLUDING: . YOUR NAME . DATE OF BIRTH . CALL BACK NUMBER . REASON FOR CALL**this is important as we prioritize the call backs  [place] AS LONG AS YOU CALL BEFORE 4:00 PM  At the [facility], you and your health needs are our priority. As part of our continuing mission to provide you with exceptional heart care, we have created designated Provider Care Teams. These Care Teams include your primary Cardiologist (physician) and Advanced Practice Providers (APPs- Physician Assistants and Nurse Practitioners) who all work together to provide you with the care you need, when you need it.   You may see any of the following providers on your designated Care Team at your next follow up: Marland Kitchen Dr Glori Bickers . Dr Loralie Champagne . Dr Vickki Muff . Darrick Grinder, NP . Lyda Jester, [place] . Audry Riles, PharmD   Please be sure to bring in all your medications bottles to every appointment.

## 2020-11-09 NOTE — Progress Notes (Addendum)
Heart Failure Clinic Note   Date:  11/09/2020   [name], DOB 11-19-1931, MRN [id number]  Location: Home  Provider location: [place] [facility] Type of Visit: Established patient, Add on  PCP:  Wenda Low, MD  Primary HF: Dr [name]  Chief Complaint: Fall   History of Present Illness:  Stuart Watson is an 87 y/o male with h/o temporal arteritis/polymalgia rheumatica, previous TIA/CVA, and recurrent pleural effusions s/p bilateral Pleurex tubes.  Has been following with Dr. [name]. His left Pleurex stopped draining and had to be replaced. Initially the new catheter drained fairly well but now it has stopped draining and a CT scan showed a moderate left effusion.  Dr. [name] removed the left Pleurex tube in 9/21 and CXR in 10/21 showed stable mild to moderate left effusion.   Here is here with his wife. Several months ago we added metolazone 2.5 weekly to help with persistent volume overload. He says he gets a good result with it with 3-4 pounds off but makes him tired. Now following with Dr. Kipp Brood. Only draining right Pleurex 1x/week (every Friday) with 300-400 cc off. Weight ranges 135-141 pounds. Walks 1.5 miles every day.      Past Medical History:  Diagnosis Date  . Actinic keratoses   . Arthritis   . CHF (congestive heart failure) ([employer])   . Chronic bilateral pleural effusions   . Chronic sinusitis   . Colon polyps   . Dyspnea 07/13/2013   BNP normal  . Edema 07/13/2013  . GERD (gastroesophageal reflux disease)    omeprazole 1-2 times daily  . H/O TIA (transient ischemic attack) and stroke    on statins and plavix -saw Dr.Sethi in the past  . Headache   . History of BPH   . History of TIA (transient ischemic attack) 07/13/2013  . Left inguinal hernia   . Left inguinal hernia 01/27/2019  . Lumbar radiculopathy   . Need for shingles vaccine   . Optic neuritis 10/2015   left eye  . Perennial allergic rhinitis   . Polymyalgia  rheumatica ([place]) 07/13/2013   Daily prednisone  . Restless leg syndrome   . Retinal disease    right eye since birth   . Shingles   . Temporal arteritis ([place]) 10/2015  . Wears glasses   . 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 BRAVO [place] STUDY N/A 04/18/2016   Procedure: BRAVO [place];  Surgeon: Wilford Corner, MD;  [facility];  Service: Endoscopy;  Laterality: N/A;  . CHEST TUBE INSERTION Right 02/13/2018   Procedure: INSERTION PLEURAL DRAINAGE CATHETER;  Surgeon: Ivin Poot, MD;  Location: [place];  Service: Thoracic;  Laterality: Right;  . CHEST TUBE INSERTION Left 11/27/2019   Procedure: REDO INSERTION PLEURAL DRAINAGE CATHETER USING 15.5 FR. [place] CATH;  Surgeon: [name], Collier Salina, MD;  Location: [facility];  Service: Thoracic;  Laterality: Left;  . COLONOSCOPY    . drropy eyelid surgery    . ESOPHAGOGASTRODUODENOSCOPY (EGD) WITH PROPOFOL N/A 04/18/2016   Procedure: ESOPHAGOGASTRODUODENOSCOPY (EGD) WITH PROPOFOL;  Surgeon: Wilford Corner, MD;  [facility];  Service: Endoscopy;  Laterality: N/A;  . INGUINAL HERNIA REPAIR Left 01/27/2019   Procedure: OPEN REPAIR LEFT INGUINAL HERNIA WITH MESH;  Surgeon: Fanny Skates, MD;  Location: [facility];  Service: General;  Laterality: Left;  GENERAL AND TAP BLOCK ANESTHESIA  . INSERTION OF MESH Left 01/27/2019  Procedure: Insertion Of Mesh;  Surgeon: Fanny Skates, MD;  Location: [facility];  Service: General;  Laterality: Left;  . IR THORACENTESIS ASP PLEURAL SPACE W/IMG GUIDE  02/04/2018  . IR THORACENTESIS ASP PLEURAL SPACE W/IMG GUIDE  02/06/2018  . REMOVAL OF PLEURAL DRAINAGE CATHETER Left 11/27/2019   Procedure: REMOVAL OF PLEURAL DRAINAGE CATHETER;  Surgeon: Ivin Poot, MD;  Location: [facility];  Service: Thoracic;  Laterality: Left;  . REMOVAL OF PLEURAL DRAINAGE CATHETER Left 03/31/2020   Procedure: REMOVAL OF PLEURAL DRAINAGE CATHETER;  Surgeon: Ivin Poot, MD;  Location:  [facility];  Service: Thoracic;  Laterality: Left;  . RIGHT/LEFT HEART CATH AND CORONARY ANGIOGRAPHY N/A 06/24/2018   Procedure: RIGHT/LEFT HEART CATH AND CORONARY ANGIOGRAPHY;  Surgeon: Jolaine Artist, MD;  Location: [facility];  Service: Cardiovascular;  Laterality: N/A;  . VASECTOMY       Current Outpatient Medications  Medication Sig Dispense Refill  . acetaminophen (TYLENOL) 500 MG tablet Take 500 mg by mouth daily as needed for moderate pain.     Marland Kitchen aspirin EC 81 MG tablet Take 81 mg by mouth daily.    . calcium carbonate (TUMS - DOSED IN MG ELEMENTAL CALCIUM) 500 MG chewable tablet Chew 1 tablet by mouth daily as needed for indigestion.    . cetirizine (ZYRTEC) 10 MG tablet Take 10 mg by mouth daily as needed for allergies.     . cholecalciferol (VITAMIN D3) 25 MCG (1000 UNIT) tablet Take 1,000 Units by mouth daily.    Marland Kitchen doxazosin (CARDURA) 8 MG tablet Take 8 mg by mouth daily.    . fluticasone (FLONASE) 50 MCG/ACT nasal spray Place 1 spray into both nostrils daily as needed for allergies or rhinitis.     Marland Kitchen guaiFENesin (MUCINEX) 600 MG 12 hr tablet Take 600 mg by mouth as needed for cough or to loosen phlegm.     Marland Kitchen levothyroxine (SYNTHROID) 25 MCG tablet Take 25 mcg by mouth daily.    . metolazone (ZAROXOLYN) 2.5 MG tablet Take 1 tablet (2.5 mg total) by mouth every Wednesday. 20 tablet 3  . Omega-3 Fatty Acids (FISH OIL PO) Take 1 tablet by mouth every other day.     . pantoprazole (PROTONIX) 20 MG tablet Take 20 mg by mouth daily.     Vladimir Faster Glycol-Propyl Glycol (SYSTANE OP) Place 1 drop into both eyes 2 (two) times daily as needed (dry eyes).    . potassium chloride SA (KLOR-CON) 20 MEQ tablet Take 3 tablets (60 mEq total) by mouth 2 (two) times daily. Take extra 2 tabs when you take Metolazone 570 tablet 3  . simvastatin (ZOCOR) 20 MG tablet Take 20 mg by mouth daily.    Marland Kitchen torsemide (DEMADEX) 20 MG tablet TAKE 3 TABLETS BY MOUTH IN  THE MORNING AND 2 TABLETS  BY MOUTH IN  THE EVENING 450 tablet 3  . vitamin C (ASCORBIC ACID) 250 MG tablet Take 250 mg by mouth daily.     No current facility-administered medications for this encounter.    Allergies:   Patient has no known allergies.   Social History:  The patient  reports that he quit smoking about 45 years ago. His smoking use included cigarettes. He has a 15.00 pack-year smoking history. He has never used smokeless tobacco. He reports current alcohol use of about 7.0 standard drinks of alcohol per week. He reports that he does not use drugs.   Family History:  The patient's family history includes  Breast cancer in his mother; Coronary artery disease in his father and mother; Diabetes in his father and mother; Berenice Primas' disease in his sister; Heart attack in his father; Stroke in his mother.   ROS:  Please see the history of present illness.   All other systems are personally reviewed and negative.   Vitals:   11/09/20 1421  BP: 110/60  Pulse: 67  SpO2: 97%    Exam: General:  Elderly male No resp difficulty HEENT: normal Neck: supple. no JVD. Carotids 2+ bilat; no bruits. No lymphadenopathy or thryomegaly appreciated. Cor: PMI nondisplaced. Regular rate & rhythm. 2/6 AS Lungs: clear Abdomen: soft, nontender, nondistended. No hepatosplenomegaly. No bruits or masses. Good bowel sounds. Extremities: no cyanosis, clubbing, rash, edema dystrophic. 2+ edema   Neuro: alert & orientedx3, cranial nerves grossly intact. moves all 4 extremities w/o difficulty. Affect pleasant   Recent Labs: 11/27/2019: Hemoglobin 11.8; Platelets 415 12/30/2019: ALT 9 07/04/2020: B Natriuretic Peptide 52.3 08/04/2020: BUN 36; Creatinine, Ser 1.36; Potassium 3.9; Sodium 139  Personally reviewed   Wt Readings from Last 3 Encounters:  11/09/20 66.6 kg (146 lb 12.8 oz)  09/30/20 64.4 kg (142 lb)  07/04/20 66.6 kg (146 lb 12.8 oz)      ASSESSMENT AND PLAN:  1. Chronic diastolic HF - Stable. NYHA II.  - volume status improved  with weekly metolazone but it wears him out. Still with mild edema but I think this is as good as we can get hime - continue torsemide 60/40 + metolazone 2.5 weekly - start spiro 12.5 daily  - cut potassium 85meq bid -> 60 meq daily  - Check labs today and in 2 weeks - Repeat echo next visit (also evaluate AS murmur) - Consider SGLT2i in the future  2. 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 - Myeloma panel with small M-spike with IgG monoclonal with kappa light chains. Likely MGUS - S/p bilat pleurex tubes  - Followed closely by Dr Kipp Brood. Left CT removed with stable small to moderate left effusion. R Pleurex working well. Drainage now decreased with metolazone use. Dr. Lowella Dell planning CT next week to re-evalaute - We considered somatostatin but they have decided against it for now  3. Coronary calcifications and abnormal stress test - Left heart cath 12/19 minimal CAD - no s/s ischemia  Signed, Glori Bickers, MD  11/09/2020 2:40 PM  [place] [address] [facility] and [place] [address] [phone] (office) [phone] (fax)

## 2020-11-09 NOTE — Addendum Note (Signed)
Encounter addended by: Scarlette Calico, RN on: 11/09/2020 3:08 PM  Actions taken: Pharmacy for encounter modified, Order list changed, Diagnosis association updated, Clinical Note Signed, Charge Capture section accepted

## 2020-11-17 ENCOUNTER — Ambulatory Visit
Admission: RE | Admit: 2020-11-17 | Discharge: 2020-11-17 | Disposition: A | Discharge status: Home / Self Care | Payer: Medicare Other | Source: Ambulatory Visit | Attending: Thoracic Surgery (Cardiothoracic Vascular Surgery) | Admitting: Thoracic Surgery (Cardiothoracic Vascular Surgery)

## 2020-11-17 DIAGNOSIS — K449 Diaphragmatic hernia without obstruction or gangrene: Secondary | ICD-10-CM | POA: Diagnosis not present

## 2020-11-17 DIAGNOSIS — J90 Pleural effusion, not elsewhere classified: Secondary | ICD-10-CM

## 2020-11-17 DIAGNOSIS — I2510 Atherosclerotic heart disease of native coronary artery without angina pectoris: Secondary | ICD-10-CM | POA: Diagnosis not present

## 2020-11-17 DIAGNOSIS — J9811 Atelectasis: Secondary | ICD-10-CM | POA: Diagnosis not present

## 2020-11-18 ENCOUNTER — Other Ambulatory Visit: Payer: Self-pay

## 2020-11-18 ENCOUNTER — Telehealth (INDEPENDENT_AMBULATORY_CARE_PROVIDER_SITE_OTHER): Payer: Medicare Other | Admitting: Thoracic Surgery (Cardiothoracic Vascular Surgery)

## 2020-11-18 DIAGNOSIS — J90 Pleural effusion, not elsewhere classified: Secondary | ICD-10-CM | POA: Diagnosis not present

## 2020-11-18 NOTE — Progress Notes (Signed)
     [address]       [place] [address]             [phone]       Patient: Home Provider: Office Consent for Telemedicine visit obtained.  Today's visit was completed via a real-time telehealth (see specific modality noted below). The patient/authorized person provided oral consent at the time of the visit to engage in a telemedicine encounter with the present provider at [facility] The patient/authorized person was informed of the potential benefits, limitations, and risks of telemedicine. The patient/authorized person expressed understanding that the laws that protect confidentiality also apply to telemedicine. The patient/authorized person acknowledged understanding that telemedicine does not provide emergency services and that he or she would need to call 911 or proceed to the nearest hospital for help if such a need arose.  . Total time spent in the clinical discussion 10 minutes. . Telehealth Modality: Phone visit (audio only)  I had a telephone visit with Stuart Watson.  They have been draining the Pleurx once a week.  Nothing produced 450- 500 mL with each drainage.  The CT scan incidentally shows small right effusion.  Drain once every 2 weeks.  I will follow-up with virtual visit in 6 weeks.

## 2020-11-23 ENCOUNTER — Ambulatory Visit (HOSPITAL_COMMUNITY)
Admission: RE | Admit: 2020-11-23 | Discharge: 2020-11-23 | Disposition: A | Discharge status: Home / Self Care | Payer: Medicare Other | Source: Ambulatory Visit | Attending: Internal Medicine | Admitting: Internal Medicine

## 2020-11-23 ENCOUNTER — Other Ambulatory Visit: Payer: Self-pay

## 2020-11-23 DIAGNOSIS — I5032 Chronic diastolic (congestive) heart failure: Secondary | ICD-10-CM | POA: Diagnosis not present

## 2020-11-23 LAB — BASIC METABOLIC PANEL
Anion gap: 6 (ref 5–15)
BUN: 31 mg/dL — ABNORMAL HIGH (ref 8–23)
CO2: 27 mmol/L (ref 22–32)
Calcium: 8.3 mg/dL — ABNORMAL LOW (ref 8.9–10.3)
Chloride: 103 mmol/L (ref 98–111)
Creatinine, Ser: 1.17 mg/dL (ref 0.61–1.24)
GFR, Estimated: >60 mL/min (ref >60)
Glucose, Bld: 104 mg/dL — ABNORMAL HIGH (ref 70–99)
Potassium: 4.3 mmol/L (ref 3.5–5.1)
Sodium: 136 mmol/L (ref 135–145)

## 2020-11-23 LAB — BRAIN NATRIURETIC PEPTIDE: B Natriuretic Peptide: 46.8 pg/mL (ref 0.0–100.0)

## 2020-12-28 DIAGNOSIS — Z23 Encounter for immunization: Secondary | ICD-10-CM | POA: Diagnosis not present

## 2020-12-30 ENCOUNTER — Other Ambulatory Visit: Payer: Self-pay

## 2020-12-30 ENCOUNTER — Telehealth (INDEPENDENT_AMBULATORY_CARE_PROVIDER_SITE_OTHER): Payer: Medicare Other | Admitting: Thoracic Surgery (Cardiothoracic Vascular Surgery)

## 2020-12-30 DIAGNOSIS — J90 Pleural effusion, not elsewhere classified: Secondary | ICD-10-CM

## 2020-12-30 NOTE — Progress Notes (Signed)
     [address]       [place] [address]             [phone]       Patient: Home Provider: Office Consent for Telemedicine visit obtained.  Today's visit was completed via a real-time telehealth (see specific modality noted below). The patient/authorized person provided oral consent at the time of the visit to engage in a telemedicine encounter with the present provider at [facility]. The patient/authorized person was informed of the potential benefits, limitations, and risks of telemedicine. The patient/authorized person expressed understanding that the laws that protect confidentiality also apply to telemedicine. The patient/authorized person acknowledged understanding that telemedicine does not provide emergency services and that he or she would need to call 911 or proceed to the nearest hospital for help if such a need arose.   Total time spent in the clinical discussion 10 minutes.  Telehealth Modality: Phone visit (audio only)  I had a telephone visit with Stuart Watson and his wife.  Unfortunately he continues to have significant chest tube output.  After draining this only once every other week the output has been between 500 and 700 mL.  I instructed him to go back to weekly drainages.  He was short of breath with draining every 2 weeks.  I will see him back as a virtual visit in 2 months.

## 2021-01-19 DIAGNOSIS — J918 Pleural effusion in other conditions classified elsewhere: Secondary | ICD-10-CM | POA: Diagnosis not present

## 2021-01-30 ENCOUNTER — Telehealth (HOSPITAL_COMMUNITY): Payer: Self-pay | Admitting: *Deleted

## 2021-01-30 NOTE — Telephone Encounter (Signed)
Pts wife called stating that she is now draining pts chest tube twice weekly. He continues to have increased output 69ml-1000ml. Pt said she understands that Dr.Lightfoot has been managing this but she wanted Dr.Bensimhon to also be aware and see if there is anything he wants done differently.   Routed to [place]

## 2021-01-31 DIAGNOSIS — M79672 Pain in left foot: Secondary | ICD-10-CM | POA: Diagnosis not present

## 2021-01-31 DIAGNOSIS — M79671 Pain in right foot: Secondary | ICD-10-CM | POA: Diagnosis not present

## 2021-01-31 NOTE — Telephone Encounter (Signed)
Attempted to call pt, left VM about ? Appt tomorrow after to call us back

## 2021-02-01 ENCOUNTER — Ambulatory Visit (HOSPITAL_COMMUNITY)
Admission: RE | Admit: 2021-02-01 | Discharge: 2021-02-01 | Disposition: A | Discharge status: Home / Self Care | Payer: Medicare Other | Source: Ambulatory Visit | Attending: Internal Medicine | Admitting: Internal Medicine

## 2021-02-01 ENCOUNTER — Other Ambulatory Visit: Payer: Self-pay

## 2021-02-01 VITALS — BP 110/52 | HR 85 | Ht 66.0 in | Wt 140.0 lb

## 2021-02-01 DIAGNOSIS — I509 Heart failure, unspecified: Secondary | ICD-10-CM | POA: Diagnosis not present

## 2021-02-01 DIAGNOSIS — I5032 Chronic diastolic (congestive) heart failure: Secondary | ICD-10-CM | POA: Diagnosis not present

## 2021-02-01 DIAGNOSIS — J90 Pleural effusion, not elsewhere classified: Secondary | ICD-10-CM | POA: Insufficient documentation

## 2021-02-01 DIAGNOSIS — I2510 Atherosclerotic heart disease of native coronary artery without angina pectoris: Secondary | ICD-10-CM | POA: Diagnosis not present

## 2021-02-01 DIAGNOSIS — L605 Yellow nail syndrome: Secondary | ICD-10-CM | POA: Diagnosis not present

## 2021-02-01 DIAGNOSIS — Z87891 Personal history of nicotine dependence: Secondary | ICD-10-CM | POA: Insufficient documentation

## 2021-02-01 DIAGNOSIS — Z79899 Other long term (current) drug therapy: Secondary | ICD-10-CM | POA: Diagnosis not present

## 2021-02-01 DIAGNOSIS — Z8249 Family history of ischemic heart disease and other diseases of the circulatory system: Secondary | ICD-10-CM | POA: Diagnosis not present

## 2021-02-01 DIAGNOSIS — M109 Gout, unspecified: Secondary | ICD-10-CM | POA: Diagnosis not present

## 2021-02-01 DIAGNOSIS — Z7982 Long term (current) use of aspirin: Secondary | ICD-10-CM | POA: Insufficient documentation

## 2021-02-01 LAB — COMPREHENSIVE METABOLIC PANEL
ALT: 8 U/L (ref 0–44)
AST: 17 U/L (ref 15–41)
Albumin: 2.9 g/dL — ABNORMAL LOW (ref 3.5–5.0)
Alkaline Phosphatase: 40 U/L (ref 38–126)
Anion gap: 8 (ref 5–15)
BUN: 32 mg/dL — ABNORMAL HIGH (ref 8–23)
CO2: 25 mmol/L (ref 22–32)
Calcium: 8.2 mg/dL — ABNORMAL LOW (ref 8.9–10.3)
Chloride: 102 mmol/L (ref 98–111)
Creatinine, Ser: 1.18 mg/dL (ref 0.61–1.24)
GFR, Estimated: >60 mL/min (ref >60)
Glucose, Bld: 107 mg/dL — ABNORMAL HIGH (ref 70–99)
Potassium: 3.5 mmol/L (ref 3.5–5.1)
Sodium: 135 mmol/L (ref 135–145)
Total Bilirubin: 0.6 mg/dL (ref 0.3–1.2)
Total Protein: 5.9 g/dL — ABNORMAL LOW (ref 6.5–8.1)

## 2021-02-01 LAB — BRAIN NATRIURETIC PEPTIDE: B Natriuretic Peptide: 96.4 pg/mL (ref 0.0–100.0)

## 2021-02-01 LAB — URIC ACID: Uric Acid, Serum: 10.2 mg/dL — ABNORMAL HIGH (ref 3.7–8.6)

## 2021-02-01 MED ORDER — ALLOPURINOL 300 MG PO TABS: 150.0000 mg | ORAL_TABLET | Freq: Every day | ORAL | 0 refills | Status: DC

## 2021-02-01 MED ORDER — PREDNISONE 10 MG PO TABS: 40.0000 mg | ORAL_TABLET | Freq: Every day | ORAL | 0 refills | Status: DC

## 2021-02-01 MED ORDER — TORSEMIDE 20 MG PO TABS: 60.0000 mg | ORAL_TABLET | Freq: Two times a day (BID) | ORAL | 0 refills | Status: DC

## 2021-02-01 NOTE — Progress Notes (Signed)
Heart Failure Clinic Note   Date:  02/01/2021   [name], DOB 01-12-1932, MRN [id number]  Location: Home  Provider location: [facility] Type of Visit: Established patient, Add on  PCP:  Stuart Low, MD  Primary HF: Dr Stuart Watson  Chief Complaint: Fall   History of Present Illness:  [name] is an 87 y/o male with h/o temporal arteritis/polymalgia rheumatica, previous TIA/CVA, and recurrent pleural effusions s/p bilateral Pleurex tubes.  Has been following with Dr. Prescott Watson. His left Pleurex stopped draining and had to be replaced. Initially the new catheter drained fairly well but now it has stopped draining and a CT scan showed a moderate left effusion.  Dr. Prescott Watson removed the left Pleurex tube in 9/21 and CXR in 10/21 showed stable mild to moderate left effusion.   Here is here with his wife. Several months ago we added metolazone 2.5 weekly to help with persistent volume overload. He says he gets a good result with it with 3-4 pounds off but makes him tired. Now following with Dr. Kipp Watson. Only draining right Pleurex 1x/week (every Friday) with 300-400 cc off. Weight ranges 135-141 pounds. Walks 1.5 miles every day.   Here for FU due to increased pleurex drainage for the last few weeks,  1L on Friday and 700cc on Monday.  Weight stable at home stable around 138-140lbs.     Past Medical History:  Diagnosis Date   Actinic keratoses    Arthritis    CHF (congestive heart failure) ([facility])    Chronic bilateral pleural effusions    Chronic sinusitis    Colon polyps    Dyspnea 07/13/2013   BNP normal   Edema 07/13/2013   GERD (gastroesophageal reflux disease)    omeprazole 1-2 times daily   H/O TIA (transient ischemic attack) and stroke    on statins and plavix -saw Stuart Watson in the past   Headache    History of BPH    History of TIA (transient ischemic attack) 07/13/2013   Left inguinal hernia    Left inguinal hernia 01/27/2019    Lumbar radiculopathy    Need for shingles vaccine    Optic neuritis 10/2015   left eye   Perennial allergic rhinitis    Polymyalgia rheumatica ([place]) 07/13/2013   Daily prednisone   Restless leg syndrome    Retinal disease    right eye since birth    Shingles    Temporal arteritis ([place]) 10/2015   Wears glasses    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 [place] CATH;  Surgeon: Stuart Watson, Collier Salina, MD;  Location: [place];  Service: Thoracic;  Laterality: Left;   COLONOSCOPY     drropy eyelid surgery     ESOPHAGOGASTRODUODENOSCOPY (EGD) WITH PROPOFOL N/A 04/18/2016   Procedure: ESOPHAGOGASTRODUODENOSCOPY (EGD) WITH PROPOFOL;  Surgeon: Wilford Corner, MD;  [facility];  Service: Endoscopy;  Laterality: N/A;   INGUINAL HERNIA REPAIR Left 01/27/2019   Procedure: OPEN REPAIR LEFT INGUINAL HERNIA  WITH MESH;  Surgeon: Fanny Skates, MD;  Location: [facility];  Service: General;  Laterality: Left;  GENERAL AND TAP BLOCK ANESTHESIA   INSERTION OF MESH Left 01/27/2019   Procedure: Insertion Of Mesh;  Surgeon: Fanny Skates, MD;  Location: [place];  Service: General;  Laterality: Left;   IR THORACENTESIS ASP PLEURAL SPACE W/IMG GUIDE  02/04/2018   IR THORACENTESIS ASP PLEURAL SPACE W/IMG GUIDE  02/06/2018   REMOVAL OF PLEURAL DRAINAGE CATHETER Left 11/27/2019   Procedure: REMOVAL OF PLEURAL DRAINAGE CATHETER;  Surgeon: Ivin Poot, MD;  Location: [place];  Service: Thoracic;  Laterality: Left;   REMOVAL OF PLEURAL DRAINAGE  CATHETER Left 03/31/2020   Procedure: REMOVAL OF PLEURAL DRAINAGE CATHETER;  Surgeon: Ivin Poot, MD;  Location: [place];  Service: Thoracic;  Laterality: Left;   RIGHT/LEFT HEART CATH AND CORONARY ANGIOGRAPHY N/A 06/24/2018   Procedure: RIGHT/LEFT HEART CATH AND CORONARY ANGIOGRAPHY;  Surgeon: Jolaine Artist, MD;  Location: [place] [facility];  Service: Cardiovascular;  Laterality: N/A;   VASECTOMY       Current Outpatient Medications  Medication Sig Dispense Refill   acetaminophen (TYLENOL) 500 MG tablet Take 500 mg by mouth daily as needed for moderate pain.      aspirin EC 81 MG tablet Take 81 mg by mouth daily.     calcium carbonate (TUMS - DOSED IN MG ELEMENTAL CALCIUM) 500 MG chewable tablet Chew 1 tablet by mouth daily as needed for indigestion.     cetirizine (ZYRTEC) 10 MG tablet Take 10 mg by mouth daily as needed for allergies.      cholecalciferol (VITAMIN D3) 25 MCG (1000 UNIT) tablet Take 1,000 Units by mouth daily.     doxazosin (CARDURA) 8 MG tablet Take 8 mg by mouth daily.     fluticasone (FLONASE) 50 MCG/ACT nasal spray Place 1 spray into both nostrils daily as needed for allergies or rhinitis.      guaiFENesin (MUCINEX) 600 MG 12 hr tablet Take 600 mg by mouth as needed for cough or to loosen phlegm.      levothyroxine (SYNTHROID) 25 MCG tablet Take 25 mcg by mouth daily.     metolazone (ZAROXOLYN) 2.5 MG tablet Take 1 tablet (2.5 mg total) by mouth every Wednesday. 20 tablet 3   Omega-3 Fatty Acids (FISH OIL PO) Take 1 tablet by mouth every other day.      pantoprazole (PROTONIX) 20 MG tablet Take 20 mg by mouth daily.      Polyethyl Glycol-Propyl Glycol (SYSTANE OP) Place 1 drop into both eyes 2 (two) times daily as needed (dry eyes).     potassium chloride SA (KLOR-CON) 20 MEQ tablet Take 3 tablets (60 mEq total) by mouth daily. Take extra 2 tabs when you take Metolazone 570 tablet 3   simvastatin (ZOCOR) 20 MG tablet Take 20 mg by mouth daily.      spironolactone (ALDACTONE) 25 MG tablet Take 0.5 tablets (12.5 mg total) by mouth daily. 15 tablet 6   torsemide (DEMADEX) 20 MG tablet TAKE 3 TABLETS BY MOUTH IN  THE MORNING AND 2 TABLETS  BY MOUTH IN THE EVENING 450 tablet 3   vitamin C (ASCORBIC ACID) 250 MG tablet Take 250 mg by mouth daily.     No current facility-administered medications for this encounter.    Allergies:   Patient has no known allergies.   Social History:  The patient  reports that he quit smoking about 45 years ago. His smoking  use included cigarettes. He has a 15.00 pack-year smoking history. He has never used smokeless tobacco. He reports current alcohol use of about 7.0 standard drinks of alcohol per week. He reports that he does not use drugs.   Family History:  The patient's family history includes Breast cancer in his mother; Coronary artery disease in his father and mother; Diabetes in his father and mother; Berenice Primas' disease in his sister; Heart attack in his father; Stroke in his mother.   ROS:  Please see the history of present illness.   All other systems are personally reviewed and negative.   Vitals:   02/01/21 1358  BP: (!) 110/52  Pulse: 85  SpO2: 96%    Exam: General:  Elderly male No resp difficulty HEENT: normal Neck: supple. no JVD. Carotids 2+ bilat; no bruits. No lymphadenopathy or thryomegaly appreciated. Cor: PMI nondisplaced. Regular rate & rhythm. 2/6 AS Lungs: clear Abdomen: soft, nontender, nondistended. No hepatosplenomegaly. No bruits or masses. Good bowel sounds. Extremities: no cyanosis, clubbing, rash, edema dystrophic. 2+ edema   Neuro: alert & orientedx3, cranial nerves grossly intact. moves all 4 extremities w/o difficulty. Affect pleasant   Recent Labs: 11/23/2020: B Natriuretic Peptide 46.8; BUN 31; Creatinine, Ser 1.17; Potassium 4.3; Sodium 136  Personally reviewed   Wt Readings from Last 3 Encounters:  02/01/21 63.5 kg (140 lb)  11/09/20 66.6 kg (146 lb 12.8 oz)   09/30/20 64.4 kg (142 lb)      ASSESSMENT AND PLAN:  1. Chronic diastolic HF - Stable. NYHA II.  - volume status improved with weekly metolazone but it wears him out. Still with mild edema but I think this is as good as we can get hime - continue torsemide 60/40 + metolazone 2.5 weekly - start spiro 12.5 daily  - cut potassium 25meq bid -> 60 meq daily  - Check labs today and in 2 weeks - Repeat echo next visit (also evaluate AS murmur) - Consider SGLT2i in the future  2. 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 - Myeloma panel with small M-spike with IgG monoclonal with kappa light chains. Likely MGUS - S/p bilat pleurex tubes  - Followed closely by Dr Stuart Watson. Left CT removed with stable small to moderate left effusion. R Pleurex working well. Drainage now decreased with metolazone use. Dr. Lowella Dell planning CT next week to re-evalaute - We considered somatostatin but they have decided against it for now  3. Coronary calcifications and abnormal stress test - Left heart cath 12/19 minimal CAD - no s/s ischemia  Signed, Katherine Roan, MD  02/01/2021 2:25 PM  [place] [address] [facility] [place] [id number] [phone] (office) [phone] (fax)   Patient seen and examined with the above-signed Advanced Practice Provider and/or Housestaff. I personally reviewed laboratory data, imaging studies and relevant notes. I independently examined the patient and formulated the important aspects of the plan. I have edited the note to reflect any of my changes or salient points. I have personally discussed the plan with the patient and/or family.  87 y/o male with yellow nail syndrome presents today for acute care visit due to increasing drainage from R pleurex tube. Also c/o of severe R foot pain.   Has been taking torsemide 60/40 + weekly metolazone> denies orthopnea or PND. Reds  24%  General:  Elderly No resp difficulty HEENT: normal Neck: supple. no JVD. Carotids 2+ bilat; no bruits. No lymphadenopathy or thryomegaly appreciated. Cor:  PMI nondisplaced. Regular rate & rhythm. No rubs, gallops or murmurs. Lungs: decreased bilateral bases Abdomen: soft, nontender, nondistended. No hepatosplenomegaly. No bruits or masses. Good bowel sounds. Extremities: no cyanosis, clubbing, rash, 2-3+ edema dystophic nails. R big toe MTP very sore to touch  Neuro: alert & orientedx3, cranial nerves grossly intact. moves all 4 extremities w/o difficulty. Affect pleasant  Will increase torsemide to 60 bid for now and see if this slows the Pleurex tube drainage. Would not remove tube at this point.   R foot pain likely podagra (gout). Treat with prednisone 40 daily x 3 days. Then start allopurinol 150 daily. Check uric acid.   See back in 2 weeks to reassess Check BMET today and in 1 week.   Glori Bickers, MD  8:10 PM

## 2021-02-01 NOTE — Progress Notes (Signed)
ReDS Vest / Clip - 02/01/21 1400       ReDS Vest / Clip   Station Marker C    Ruler Value 27    ReDS Value Range Low volume    ReDS Actual Value 24

## 2021-02-01 NOTE — Patient Instructions (Addendum)
INCREASE Torsemide to 60 mg Twice daily  Start Prednisone 40 mg(4 tabs) daily x 3 days  Allopurinol 150 mg (1/2 Tablet) daily start on Monday 7/18  Labs done today, your results will be available in MyChart, we will contact you for abnormal readings.  Your physician recommends that you schedule a follow-up appointment in: 2 weeks   Your physician recommends that you schedule a follow-up appointment in: 1 week for lab work  If you have any questions or concerns before your next appointment please send [place] a message through [place] or call our office at [phone].    TO LEAVE A MESSAGE FOR THE NURSE SELECT OPTION 2, PLEASE LEAVE A MESSAGE INCLUDING: YOUR NAME DATE OF BIRTH CALL BACK NUMBER REASON FOR CALL**this is important as we prioritize the call backs  YOU WILL RECEIVE A CALL BACK THE SAME DAY AS LONG AS YOU CALL BEFORE 4:00 PM   If you have any questions or concerns before your next appointment please send [place] a message through [name] or call our office at [phone].    TO LEAVE A MESSAGE FOR THE NURSE SELECT OPTION 2, PLEASE LEAVE A MESSAGE INCLUDING: YOUR NAME DATE OF BIRTH CALL BACK NUMBER REASON FOR CALL**this is important as we prioritize the call backs  YOU WILL RECEIVE A CALL BACK THE SAME DAY AS LONG AS YOU CALL BEFORE 4:00 PM

## 2021-02-02 ENCOUNTER — Telehealth (HOSPITAL_COMMUNITY): Payer: Self-pay | Admitting: Internal Medicine

## 2021-02-02 MED ORDER — PREDNISONE 10 MG PO TABS: 40.0000 mg | ORAL_TABLET | Freq: Every day | ORAL | 0 refills | Status: DC

## 2021-02-02 MED ORDER — ALLOPURINOL 300 MG PO TABS: 150.0000 mg | ORAL_TABLET | Freq: Every day | ORAL | 0 refills | Status: DC

## 2021-02-02 NOTE — Telephone Encounter (Signed)
Pt stated medication was never called in to pharmacy after visit yesterday, please advise

## 2021-02-02 NOTE — Addendum Note (Signed)
Encounter addended by: Stanford Scotland, RN on: 02/02/2021 9:26 AM  Actions taken: Order list changed, Pharmacy for encounter modified

## 2021-02-02 NOTE — Telephone Encounter (Signed)
Rx sent to [facility], pt's wife aware

## 2021-02-03 DIAGNOSIS — Z20822 Contact with and (suspected) exposure to covid-19: Secondary | ICD-10-CM | POA: Diagnosis not present

## 2021-02-08 ENCOUNTER — Other Ambulatory Visit: Payer: Self-pay

## 2021-02-08 ENCOUNTER — Ambulatory Visit (HOSPITAL_COMMUNITY)
Admission: RE | Admit: 2021-02-08 | Discharge: 2021-02-08 | Disposition: A | Discharge status: Home / Self Care | Payer: Medicare Other | Source: Ambulatory Visit | Attending: Family Medicine | Admitting: Family Medicine

## 2021-02-08 DIAGNOSIS — I509 Heart failure, unspecified: Secondary | ICD-10-CM | POA: Insufficient documentation

## 2021-02-08 LAB — BASIC METABOLIC PANEL
Anion gap: 6 (ref 5–15)
BUN: 37 mg/dL — ABNORMAL HIGH (ref 8–23)
CO2: 30 mmol/L (ref 22–32)
Calcium: 8.2 mg/dL — ABNORMAL LOW (ref 8.9–10.3)
Chloride: 101 mmol/L (ref 98–111)
Creatinine, Ser: 1.25 mg/dL — ABNORMAL HIGH (ref 0.61–1.24)
GFR, Estimated: 56 mL/min — ABNORMAL LOW (ref >60)
Glucose, Bld: 105 mg/dL — ABNORMAL HIGH (ref 70–99)
Potassium: 3.7 mmol/L (ref 3.5–5.1)
Sodium: 137 mmol/L (ref 135–145)

## 2021-02-13 ENCOUNTER — Ambulatory Visit
Admission: RE | Admit: 2021-02-13 | Discharge: 2021-02-13 | Disposition: A | Discharge status: Home / Self Care | Payer: Medicare Other | Source: Ambulatory Visit | Attending: Internal Medicine | Admitting: Internal Medicine

## 2021-02-13 ENCOUNTER — Other Ambulatory Visit: Payer: Self-pay | Admitting: Internal Medicine

## 2021-02-13 ENCOUNTER — Other Ambulatory Visit: Payer: Self-pay

## 2021-02-13 DIAGNOSIS — F0390 Unspecified dementia without behavioral disturbance: Secondary | ICD-10-CM

## 2021-02-13 DIAGNOSIS — R9082 White matter disease, unspecified: Secondary | ICD-10-CM | POA: Diagnosis not present

## 2021-02-13 DIAGNOSIS — J90 Pleural effusion, not elsewhere classified: Secondary | ICD-10-CM | POA: Diagnosis not present

## 2021-02-13 DIAGNOSIS — E7800 Pure hypercholesterolemia, unspecified: Secondary | ICD-10-CM | POA: Diagnosis not present

## 2021-02-13 DIAGNOSIS — R011 Cardiac murmur, unspecified: Secondary | ICD-10-CM | POA: Diagnosis not present

## 2021-02-13 DIAGNOSIS — I700 Atherosclerosis of aorta: Secondary | ICD-10-CM | POA: Diagnosis not present

## 2021-02-13 DIAGNOSIS — E039 Hypothyroidism, unspecified: Secondary | ICD-10-CM | POA: Diagnosis not present

## 2021-02-13 DIAGNOSIS — L605 Yellow nail syndrome: Secondary | ICD-10-CM | POA: Diagnosis not present

## 2021-02-13 DIAGNOSIS — M353 Polymyalgia rheumatica: Secondary | ICD-10-CM | POA: Diagnosis not present

## 2021-02-16 ENCOUNTER — Encounter (HOSPITAL_COMMUNITY): Payer: Medicare Other

## 2021-02-21 NOTE — Progress Notes (Signed)
[facility] Note   Date:  02/22/2021   [name], DOB 30-Oct-1931, MRN [id number]  Location: Home  Provider location: [place] [facility] Type of Visit: Established patient, Add on  PCP:  Wenda Low, MD  Primary HF: Dr [name]  Chief Complaint: HF follow up   History of Present Illness:  Stuart Watson is an 87 y/o male with h/o temporal arteritis/polymalgia rheumatica, previous TIA/CVA, and recurrent pleural effusions s/p bilateral Pleurex tubes.  Has been following with Dr. [name]. His left Pleurex stopped draining and had to be replaced. Initially the new catheter drained fairly well but now it has stopped draining and a CT scan showed a moderate left effusion.  Dr. [name] removed the left Pleurex tube in 9/21 and CXR in 10/21 showed stable mild to moderate left effusion.   Today he returns for HF follow up and follow up on PleurX drainage with his wife. Last visit he had increased drainage 1L one day and another 700 cc 3 days later. His diuretics were increased and his drainage is now back to baseline. Follows with Dr. Wyvonnia Lora and draining R Pleurex 1x/week on Fridays w/ 300-400 cc off.Takes metolazone weekly with good results, but makes him tired.  Denies CP, dizziness, edema, or PND/Orthopnea. Appetite ok. No fever or chills. Weight at home 138-140 pounds. Taking all medications. Gout has calmed down.  Past Medical History:  Diagnosis Date   Actinic keratoses    Arthritis    CHF (congestive heart failure) (HCC)    Chronic bilateral pleural effusions    Chronic sinusitis    Colon polyps    Dyspnea 07/13/2013   BNP normal   Edema 07/13/2013   GERD (gastroesophageal reflux disease)    omeprazole 1-2 times daily   H/O TIA (transient ischemic attack) and stroke    on statins and plavix -saw Dr.Sethi in the past   Headache    History of BPH    History of TIA (transient ischemic attack) 07/13/2013   Left inguinal hernia     Left inguinal hernia 01/27/2019   Lumbar radiculopathy    Need for shingles vaccine    Optic neuritis 10/2015   left eye   Perennial allergic rhinitis    Polymyalgia rheumatica ([place]) 07/13/2013   Daily prednisone   Restless leg syndrome    Retinal disease    right eye since birth    Shingles    Temporal arteritis ([place]) 10/2015   Wears glasses    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 [place] CATH;  Surgeon: [name], Collier Salina, MD;  Location: [place];  Service: Thoracic;  Laterality: Left;   COLONOSCOPY     drropy eyelid surgery     ESOPHAGOGASTRODUODENOSCOPY (EGD) WITH PROPOFOL N/A 04/18/2016   Procedure: ESOPHAGOGASTRODUODENOSCOPY (EGD) WITH PROPOFOL;  Surgeon: Wilford Corner, MD;  [facility];  Service: Endoscopy;  Laterality: N/A;   INGUINAL HERNIA REPAIR Left 01/27/2019   Procedure: OPEN REPAIR LEFT INGUINAL HERNIA  WITH MESH;  Surgeon: Fanny Skates, MD;  Location: [facility];  Service: General;  Laterality: Left;  GENERAL AND TAP BLOCK ANESTHESIA   INSERTION OF MESH Left 01/27/2019   Procedure: Insertion Of Mesh;  Surgeon: Fanny Skates, MD;  Location: [place];  Service: General;  Laterality: Left;   IR THORACENTESIS ASP PLEURAL SPACE W/IMG GUIDE  02/04/2018   IR THORACENTESIS ASP PLEURAL SPACE W/IMG GUIDE  02/06/2018   REMOVAL OF PLEURAL DRAINAGE CATHETER Left 11/27/2019   Procedure: REMOVAL OF PLEURAL DRAINAGE CATHETER;  Surgeon: Ivin Poot, MD;  Location: [facility];  Service: Thoracic;  Laterality: Left;    REMOVAL OF PLEURAL DRAINAGE CATHETER Left 03/31/2020   Procedure: REMOVAL OF PLEURAL DRAINAGE CATHETER;  Surgeon: Ivin Poot, MD;  Location: [facility];  Service: Thoracic;  Laterality: Left;   RIGHT/LEFT HEART CATH AND CORONARY ANGIOGRAPHY N/A 06/24/2018   Procedure: RIGHT/LEFT HEART CATH AND CORONARY ANGIOGRAPHY;  Surgeon: Jolaine Artist, MD;  Location: [facility];  Service: Cardiovascular;  Laterality: N/A;   VASECTOMY       Current Outpatient Medications  Medication Sig Dispense Refill   acetaminophen (TYLENOL) 500 MG tablet Take 500 mg by mouth daily as needed for moderate pain.      allopurinol (ZYLOPRIM) 300 MG tablet Take 0.5 tablets (150 mg total) by mouth daily. 90 tablet 0   aspirin EC 81 MG tablet Take 81 mg by mouth daily.     calcium carbonate (TUMS - DOSED IN MG ELEMENTAL CALCIUM) 500 MG chewable tablet Chew 1 tablet by mouth daily as needed for indigestion.     cetirizine (ZYRTEC) 10 MG tablet Take 10 mg by mouth daily as needed for allergies.      cholecalciferol (VITAMIN D3) 25 MCG (1000 UNIT) tablet Take 1,000 Units by mouth daily.     donepezil (ARICEPT) 5 MG tablet Take 5 mg by mouth at bedtime.     doxazosin (CARDURA) 8 MG tablet Take 8 mg by mouth daily.     fluticasone (FLONASE) 50 MCG/ACT nasal spray Place 1 spray into both nostrils daily as needed for allergies or rhinitis.      guaiFENesin (MUCINEX) 600 MG 12 hr tablet Take 600 mg by mouth as needed for cough or to loosen phlegm.      levothyroxine (SYNTHROID) 25 MCG tablet Take 25 mcg by mouth daily.     metolazone (ZAROXOLYN) 2.5 MG tablet Take 1 tablet (2.5 mg total) by mouth every Wednesday. 20 tablet 3   Omega-3 Fatty Acids (FISH OIL PO) Take 1 tablet by mouth every other day.      pantoprazole (PROTONIX) 20 MG tablet Take 20 mg by mouth daily.      Polyethyl Glycol-Propyl Glycol (SYSTANE OP) Place 1 drop into both eyes 2 (two) times daily as needed (dry eyes).     potassium chloride SA  (KLOR-CON) 20 MEQ tablet Take 3 tablets (60 mEq total) by mouth daily. Take extra 2 tabs when you take Metolazone 570 tablet 3   simvastatin (ZOCOR) 20 MG tablet Take 20 mg by mouth daily.     spironolactone (ALDACTONE) 25 MG tablet Take 0.5 tablets (12.5 mg total) by mouth daily. 15 tablet 6   torsemide (DEMADEX) 20 MG tablet Take 3 tablets (60 mg total) by mouth 2 (two) times daily. 90 tablet 0   vitamin C (ASCORBIC ACID) 250 MG tablet Take 250 mg by mouth daily.     No current facility-administered medications for this encounter.  Allergies:   Patient has no known allergies.   Social History:  The patient  reports that he quit smoking about 45 years ago. His smoking use included cigarettes. He has a 15.00 pack-year smoking history. He has never used smokeless tobacco. He reports current alcohol use of about 7.0 standard drinks of alcohol per week. He reports that he does not use drugs.   Family History:  The patient's family history includes Breast cancer in his mother; Coronary artery disease in his father and mother; Diabetes in his father and mother; Berenice Primas' disease in his sister; Heart attack in his father; Stroke in his mother.   ROS:  Please see the history of present illness.   All other systems are personally reviewed and negative.   Recent Labs: 02/01/2021: ALT 8; B Natriuretic Peptide 96.4 02/08/2021: BUN 37; Creatinine, Ser 1.25; Potassium 3.7; Sodium 137  Personally reviewed   Wt Readings from Last 3 Encounters:  02/22/21 62.9 kg (138 lb 9.6 oz)  02/01/21 63.5 kg (140 lb)  11/09/20 66.6 kg (146 lb 12.8 oz)    BP 100/60   Pulse 64   Wt 62.9 kg (138 lb 9.6 oz)   SpO2 97%   BMI 22.37 kg/m   Exam: General:  NAD. No resp difficulty, elderly HEENT: Normal Neck: Supple. No JVD. Carotids 2+ bilat; no bruits. No lymphadenopathy or thryomegaly appreciated. Cor: PMI nondisplaced. Regular rate & rhythm. No rubs, gallops or II/VI AS. Lungs: Clear, decreased in  bases. Abdomen: Soft, nontender, nondistended. No hepatosplenomegaly. No bruits or masses. Good bowel sounds. Extremities: No cyanosis, clubbing, rash, trace pedal edema Neuro: Alert & oriented x 3, cranial nerves grossly intact. Moves all 4 extremities w/o difficulty. Affect pleasant.  ASSESSMENT AND PLAN:  1. Chronic diastolic HF - Stable. NYHA II.  - Volume status improved with weekly metolazone but it wears him out. Still with some mild LE edema. - Continue torsemide 60 mg bid + metolazone 2.5 weekly. - Continue spiro 12.5 daily.  - Repeat Echo next visit. - Discussed starting Jardiance today. They would like to hold off for now. - BMET today.  2. 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 - Myeloma panel with small M-spike with IgG monoclonal with kappa light chains. Likely MGUS - S/p bilat pleurex tubes  - Followed closely by Dr Kipp Brood. Left CT removed with stable small to moderate left effusion. R Pleurex working well. Drainage now decreased with metolazone use. Dr. Lowella Dell planning CT to re-evaluate. He has appt scheduled 8/12. - No somatostatin for now.  3. Coronary calcifications and abnormal stress test - LHC (12/19) minimal CAD - No s/s ischemia.  4. Gout - Treated with prednisone x 3 days - Continue allopurinol. - No further issues.  Follow up in 3 weeks with Dr. [name] + echo as scheduled.  Signed, Rafael Bihari, FNP  02/22/2021 2:45 PM  [facility] [address] [facility] [place] [address] [phone] (office) [phone] (fax)

## 2021-02-22 ENCOUNTER — Other Ambulatory Visit (HOSPITAL_COMMUNITY): Payer: Self-pay

## 2021-02-22 ENCOUNTER — Other Ambulatory Visit: Payer: Self-pay

## 2021-02-22 ENCOUNTER — Ambulatory Visit (HOSPITAL_COMMUNITY)
Admission: RE | Admit: 2021-02-22 | Discharge: 2021-02-22 | Disposition: A | Discharge status: Home / Self Care | Payer: Medicare Other | Source: Ambulatory Visit | Attending: Family Medicine | Admitting: Family Medicine

## 2021-02-22 ENCOUNTER — Encounter (HOSPITAL_COMMUNITY): Payer: Self-pay

## 2021-02-22 VITALS — BP 100/60 | HR 64 | Wt 138.6 lb

## 2021-02-22 DIAGNOSIS — J90 Pleural effusion, not elsewhere classified: Secondary | ICD-10-CM | POA: Diagnosis not present

## 2021-02-22 DIAGNOSIS — Z8673 Personal history of transient ischemic attack (TIA), and cerebral infarction without residual deficits: Secondary | ICD-10-CM | POA: Insufficient documentation

## 2021-02-22 DIAGNOSIS — I2584 Coronary atherosclerosis due to calcified coronary lesion: Secondary | ICD-10-CM

## 2021-02-22 DIAGNOSIS — Z87891 Personal history of nicotine dependence: Secondary | ICD-10-CM | POA: Diagnosis not present

## 2021-02-22 DIAGNOSIS — Z7982 Long term (current) use of aspirin: Secondary | ICD-10-CM | POA: Diagnosis not present

## 2021-02-22 DIAGNOSIS — I2510 Atherosclerotic heart disease of native coronary artery without angina pectoris: Secondary | ICD-10-CM | POA: Insufficient documentation

## 2021-02-22 DIAGNOSIS — I5032 Chronic diastolic (congestive) heart failure: Secondary | ICD-10-CM | POA: Insufficient documentation

## 2021-02-22 DIAGNOSIS — Z8249 Family history of ischemic heart disease and other diseases of the circulatory system: Secondary | ICD-10-CM | POA: Insufficient documentation

## 2021-02-22 DIAGNOSIS — I509 Heart failure, unspecified: Secondary | ICD-10-CM | POA: Diagnosis not present

## 2021-02-22 DIAGNOSIS — Z09 Encounter for follow-up examination after completed treatment for conditions other than malignant neoplasm: Secondary | ICD-10-CM | POA: Diagnosis not present

## 2021-02-22 DIAGNOSIS — M109 Gout, unspecified: Secondary | ICD-10-CM | POA: Insufficient documentation

## 2021-02-22 DIAGNOSIS — Z79899 Other long term (current) drug therapy: Secondary | ICD-10-CM | POA: Diagnosis not present

## 2021-02-22 LAB — BASIC METABOLIC PANEL
Anion gap: 9 (ref 5–15)
BUN: 32 mg/dL — ABNORMAL HIGH (ref 8–23)
CO2: 28 mmol/L (ref 22–32)
Calcium: 8.3 mg/dL — ABNORMAL LOW (ref 8.9–10.3)
Chloride: 102 mmol/L (ref 98–111)
Creatinine, Ser: 1.14 mg/dL (ref 0.61–1.24)
GFR, Estimated: >60 mL/min (ref >60)
Glucose, Bld: 93 mg/dL (ref 70–99)
Potassium: 3.8 mmol/L (ref 3.5–5.1)
Sodium: 139 mmol/L (ref 135–145)

## 2021-02-22 NOTE — Patient Instructions (Signed)
It was great to see you today! No medication changes are needed at this time.  Labs today We will only contact you if something comes back abnormal or we need to make some changes. Otherwise no news is good news!  Keep follow up as scheduled  Do the following things EVERYDAY: Weigh yourself in the morning before breakfast. Write it down and keep it in a log. Take your medicines as prescribed Eat low salt foods--Limit salt (sodium) to 2000 mg per day.  Stay as active as you can everyday Limit all fluids for the day to less than 2 liters  milAt the [facility], you and your health needs are our priority. As part of our continuing mission to provide you with exceptional heart care, we have created designated Provider Care Teams. These Care Teams include your primary Cardiologist (physician) and Advanced Practice Providers (APPs- Physician Assistants and Nurse Practitioners) who all work together to provide you with the care you need, when you need it.   You may see any of the following providers on your designated Care Team at your next follow up: Dr Glori Bickers Dr Loralie Champagne Dr Patrice Paradise, NP Lyda Jester, [place] Ginnie Smart Audry Riles, PharmD   Please be sure to bring in all your medications bottles to every appointment.

## 2021-03-03 ENCOUNTER — Telehealth (INDEPENDENT_AMBULATORY_CARE_PROVIDER_SITE_OTHER): Payer: Medicare Other | Admitting: Thoracic Surgery (Cardiothoracic Vascular Surgery)

## 2021-03-03 ENCOUNTER — Other Ambulatory Visit: Payer: Self-pay

## 2021-03-03 DIAGNOSIS — J90 Pleural effusion, not elsewhere classified: Secondary | ICD-10-CM | POA: Diagnosis not present

## 2021-03-03 NOTE — Progress Notes (Signed)
     [address]       [place] [address]             [phone]       Patient: Home Provider: Office Consent for Telemedicine visit obtained.  Today's visit was completed via a real-time telehealth (see specific modality noted below). The patient/authorized person provided oral consent at the time of the visit to engage in a telemedicine encounter with the present provider at [facility]. The patient/authorized person was informed of the potential benefits, limitations, and risks of telemedicine. The patient/authorized person expressed understanding that the laws that protect confidentiality also apply to telemedicine. The patient/authorized person acknowledged understanding that telemedicine does not provide emergency services and that he or she would need to call 911 or proceed to the nearest hospital for help if such a need arose.   Total time spent in the clinical discussion 10 minutes.  Telehealth Modality: Phone visit (audio only)  I had a telephone visit with Mr. And Mrs Kopacz.  He continues to have significant chest tube output.  Several weeks ago he was draining about a liter per week but was placed on a new diuretic, and now is only draining about 300 mL weekly.  He is only draining once a week at this point.  He denies any shortness of breath.  I instructed them to give [place] a call once his weekly drainage has been under 200 on a weekly basis.  Jana Swartzlander Bary Leriche

## 2021-03-13 ENCOUNTER — Ambulatory Visit (HOSPITAL_COMMUNITY)
Admission: RE | Admit: 2021-03-13 | Discharge: 2021-03-13 | Disposition: A | Discharge status: Home / Self Care | Payer: Medicare Other | Source: Ambulatory Visit | Attending: Internal Medicine | Admitting: Internal Medicine

## 2021-03-13 ENCOUNTER — Ambulatory Visit (HOSPITAL_BASED_OUTPATIENT_CLINIC_OR_DEPARTMENT_OTHER)
Admission: RE | Admit: 2021-03-13 | Discharge: 2021-03-13 | Disposition: A | Discharge status: Home / Self Care | Payer: Medicare Other | Source: Ambulatory Visit | Attending: Internal Medicine | Admitting: Internal Medicine

## 2021-03-13 ENCOUNTER — Other Ambulatory Visit: Payer: Self-pay

## 2021-03-13 VITALS — BP 110/60 | HR 60 | Wt 137.0 lb

## 2021-03-13 DIAGNOSIS — R252 Cramp and spasm: Secondary | ICD-10-CM | POA: Diagnosis not present

## 2021-03-13 DIAGNOSIS — R609 Edema, unspecified: Secondary | ICD-10-CM

## 2021-03-13 DIAGNOSIS — Z87891 Personal history of nicotine dependence: Secondary | ICD-10-CM | POA: Insufficient documentation

## 2021-03-13 DIAGNOSIS — I2584 Coronary atherosclerosis due to calcified coronary lesion: Secondary | ICD-10-CM | POA: Diagnosis not present

## 2021-03-13 DIAGNOSIS — Z79899 Other long term (current) drug therapy: Secondary | ICD-10-CM | POA: Diagnosis not present

## 2021-03-13 DIAGNOSIS — I700 Atherosclerosis of aorta: Secondary | ICD-10-CM

## 2021-03-13 DIAGNOSIS — I2510 Atherosclerotic heart disease of native coronary artery without angina pectoris: Secondary | ICD-10-CM | POA: Diagnosis not present

## 2021-03-13 DIAGNOSIS — M109 Gout, unspecified: Secondary | ICD-10-CM | POA: Diagnosis not present

## 2021-03-13 DIAGNOSIS — Z8249 Family history of ischemic heart disease and other diseases of the circulatory system: Secondary | ICD-10-CM | POA: Diagnosis not present

## 2021-03-13 DIAGNOSIS — Z7982 Long term (current) use of aspirin: Secondary | ICD-10-CM | POA: Insufficient documentation

## 2021-03-13 DIAGNOSIS — Z8673 Personal history of transient ischemic attack (TIA), and cerebral infarction without residual deficits: Secondary | ICD-10-CM | POA: Diagnosis not present

## 2021-03-13 DIAGNOSIS — R0600 Dyspnea, unspecified: Secondary | ICD-10-CM | POA: Diagnosis not present

## 2021-03-13 DIAGNOSIS — J90 Pleural effusion, not elsewhere classified: Secondary | ICD-10-CM | POA: Insufficient documentation

## 2021-03-13 DIAGNOSIS — L605 Yellow nail syndrome: Secondary | ICD-10-CM | POA: Diagnosis not present

## 2021-03-13 DIAGNOSIS — I5032 Chronic diastolic (congestive) heart failure: Secondary | ICD-10-CM | POA: Diagnosis not present

## 2021-03-13 DIAGNOSIS — I340 Nonrheumatic mitral (valve) insufficiency: Secondary | ICD-10-CM | POA: Diagnosis not present

## 2021-03-13 LAB — BASIC METABOLIC PANEL
Anion gap: 8 (ref 5–15)
BUN: 34 mg/dL — ABNORMAL HIGH (ref 8–23)
CO2: 31 mmol/L (ref 22–32)
Calcium: 8.6 mg/dL — ABNORMAL LOW (ref 8.9–10.3)
Chloride: 99 mmol/L (ref 98–111)
Creatinine, Ser: 1.26 mg/dL — ABNORMAL HIGH (ref 0.61–1.24)
GFR, Estimated: 55 mL/min — ABNORMAL LOW (ref >60)
Glucose, Bld: 96 mg/dL (ref 70–99)
Potassium: 4.5 mmol/L (ref 3.5–5.1)
Sodium: 138 mmol/L (ref 135–145)

## 2021-03-13 LAB — ECHOCARDIOGRAM COMPLETE
Area-P 1/2: 2.29 cm2
Calc EF: 54.1 %
S' Lateral: 3.3 cm
Single Plane A2C EF: 49.5 %
Single Plane A4C EF: 58.2 %

## 2021-03-13 LAB — MAGNESIUM: Magnesium: 3 mg/dL — ABNORMAL HIGH (ref 1.7–2.4)

## 2021-03-13 LAB — BRAIN NATRIURETIC PEPTIDE: B Natriuretic Peptide: 45.1 pg/mL (ref 0.0–100.0)

## 2021-03-13 NOTE — Progress Notes (Signed)
[facility] Note   Date:  03/13/2021   [name], DOB September 25, 1931, MRN [id number]  Location: Home  Provider location: Stuart Watson Type of Visit: Established patient, Add on  PCP:  Stuart Low, Watson  Primary HF: Stuart Watson  Chief Complaint: Fall   History of Present Illness:  Stuart Watson is an 87 y/o male with h/o temporal arteritis/polymalgia rheumatica, previous TIA/CVA, and recurrent pleural effusions s/p bilateral Pleurex tubes.  Has been following with Stuart Watson. His left Pleurex stopped draining and had to be replaced. Initially the new catheter drained fairly well but it stopped draining and CT scan showed a moderate left effusion.  Stuart Watson removed the left Pleurex tube 9/21 and CXR in 10/21 showed stable mild to moderate left effusion. Now follows with Stuart Watson.   Has been using metolazone 2.5 mg weekly since November 2021 for persistent volume overload.  Seen for f/u on 02/01/21 d/t increased pleurex drainage, up to 1L one day and 700 cc 3 days later. Torsemide increased which helped with slowing drainage. Treated for gout flare with prednisone and allopurinol.  Last seen in clinic on 08/03. Stable from HF standpoint. Weight averaging 138-140 lb.   He is here today for follow-up with echo. Says he is doing pretty well. Edema well controlled. Says he continues to struggle with cramps in his hands. Draining about 550cc about once a week from right Pleurex. Taking torsemide 60 bid and metolazone every Wednesday. Able to do most activities without problem. Walking 1.5 miles daily.   Echo today 03/13/21 EF 55% RV normal. AoV moderately calcified.  No significant AS Personally reviewed   Past Medical History:  Diagnosis Date   Actinic keratoses    Arthritis    CHF (congestive heart Watson) (HCC)    Chronic bilateral pleural effusions    Chronic sinusitis    Colon polyps    Dyspnea 07/13/2013   BNP normal   Edema  07/13/2013   GERD (gastroesophageal reflux disease)    omeprazole 1-2 times daily   H/O TIA (transient ischemic attack) and stroke    on statins and plavix -saw Stuart Watson in the past   Headache    History of BPH    History of TIA (transient ischemic attack) 07/13/2013   Left inguinal hernia    Left inguinal hernia 01/27/2019   Lumbar radiculopathy    Need for shingles vaccine    Optic neuritis 10/2015   left eye   Perennial allergic rhinitis    Polymyalgia rheumatica ([place]) 07/13/2013   Daily prednisone   Restless leg syndrome    Retinal disease    right eye since birth    Shingles    Temporal arteritis ([place]) 10/2015   Wears glasses    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   Past Surgical History:  Procedure Laterality Date   BRAVO [place] STUDY N/A 04/18/2016   Procedure: BRAVO [place] STUDY;  Surgeon: Stuart Corner, Watson;  [facility];  Service: Endoscopy;  Laterality: N/A;   CHEST TUBE INSERTION Right 02/13/2018   Procedure: INSERTION PLEURAL DRAINAGE CATHETER;  Surgeon: Stuart Poot, Watson;  Location: [facility];  Service: Thoracic;  Laterality: Right;   CHEST TUBE INSERTION Left 11/27/2019   Procedure: REDO INSERTION PLEURAL DRAINAGE CATHETER USING 15.5 FR. [place] CATH;  Surgeon: Stuart Watson, Stuart Watson;  Location: [facility];  Service: Thoracic;  Laterality: Left;   COLONOSCOPY     drropy eyelid surgery  ESOPHAGOGASTRODUODENOSCOPY (EGD) WITH PROPOFOL N/A 04/18/2016   Procedure: ESOPHAGOGASTRODUODENOSCOPY (EGD) WITH PROPOFOL;  Surgeon: Stuart Corner, Watson;  [facility];  Service: Endoscopy;  Laterality: N/A;   INGUINAL HERNIA REPAIR Left 01/27/2019   Procedure: OPEN REPAIR LEFT INGUINAL HERNIA WITH MESH;  Surgeon: Stuart Skates, Watson;  Location: [facility];  Service: General;  Laterality: Left;  GENERAL AND TAP BLOCK ANESTHESIA   INSERTION OF MESH Left 01/27/2019   Procedure: Insertion Of Mesh;  Surgeon: Stuart Skates, Watson;  Location: [facility];  Service: General;  Laterality: Left;   IR  THORACENTESIS ASP PLEURAL SPACE W/IMG GUIDE  02/04/2018   IR THORACENTESIS ASP PLEURAL SPACE W/IMG GUIDE  02/06/2018   REMOVAL OF PLEURAL DRAINAGE CATHETER Left 11/27/2019   Procedure: REMOVAL OF PLEURAL DRAINAGE CATHETER;  Surgeon: Stuart Poot, Watson;  Location: [facility];  Service: Thoracic;  Laterality: Left;   REMOVAL OF PLEURAL DRAINAGE CATHETER Left 03/31/2020   Procedure: REMOVAL OF PLEURAL DRAINAGE CATHETER;  Surgeon: Stuart Poot, Watson;  Location: [facility];  Service: Thoracic;  Laterality: Left;   RIGHT/LEFT HEART CATH AND CORONARY ANGIOGRAPHY N/A 06/24/2018   Procedure: RIGHT/LEFT HEART CATH AND CORONARY ANGIOGRAPHY;  Surgeon: Stuart Artist, Watson;  Location: [facility];  Service: Cardiovascular;  Laterality: N/A;   VASECTOMY       Current Outpatient Medications  Medication Sig Dispense Refill   acetaminophen (TYLENOL) 500 MG tablet Take 500 mg by mouth daily as needed for moderate pain.      allopurinol (ZYLOPRIM) 300 MG tablet Take 0.5 tablets (150 mg total) by mouth daily. 90 tablet 0   aspirin EC 81 MG tablet Take 81 mg by mouth daily.     calcium carbonate (TUMS - DOSED IN MG ELEMENTAL CALCIUM) 500 MG chewable tablet Chew 1 tablet by mouth daily as needed for indigestion.     cetirizine (ZYRTEC) 10 MG tablet Take 10 mg by mouth daily as needed for allergies.      cholecalciferol (VITAMIN D3) 25 MCG (1000 UNIT) tablet Take 1,000 Units by mouth daily.     donepezil (ARICEPT) 5 MG tablet Take 5 mg by mouth at bedtime.     doxazosin (CARDURA) 8 MG tablet Take 8 mg by mouth daily.     fluticasone (FLONASE) 50 MCG/ACT nasal spray Place 1 spray into both nostrils daily as needed for allergies or rhinitis.      guaiFENesin (MUCINEX) 600 MG 12 hr tablet Take 600 mg by mouth as needed for cough or to loosen phlegm.      levothyroxine (SYNTHROID) 25 MCG tablet Take 25 mcg by mouth daily.     metolazone (ZAROXOLYN) 2.5 MG tablet Take 1 tablet (2.5 mg total) by mouth every Wednesday. 20  tablet 3   Omega-3 Fatty Acids (FISH OIL PO) Take 1 tablet by mouth every other day.      pantoprazole (PROTONIX) 20 MG tablet Take 20 mg by mouth daily.      Polyethyl Glycol-Propyl Glycol (SYSTANE OP) Place 1 drop into both eyes 2 (two) times daily as needed (dry eyes).     potassium chloride SA (KLOR-CON) 20 MEQ tablet Take 3 tablets (60 mEq total) by mouth daily. Take extra 2 tabs when you take Metolazone 570 tablet 3   simvastatin (ZOCOR) 20 MG tablet Take 20 mg by mouth daily.     spironolactone (ALDACTONE) 25 MG tablet Take 0.5 tablets (12.5 mg total) by mouth daily. 15 tablet 6   torsemide (DEMADEX) 20 MG  tablet Take 3 tablets (60 mg total) by mouth 2 (two) times daily. 90 tablet 0   vitamin C (ASCORBIC ACID) 250 MG tablet Take 250 mg by mouth daily.     No current facility-administered medications for this encounter.    Allergies:   Patient has no known allergies.   Social History:  The patient  reports that he quit smoking about 45 years ago. His smoking use included cigarettes. He has a 15.00 pack-year smoking history. He has never used smokeless tobacco. He reports current alcohol use of about 7.0 standard drinks per week. He reports that he does not use drugs.   Family History:  The patient's family history includes Breast cancer in his mother; Coronary artery disease in his father and mother; Diabetes in his father and mother; Berenice Primas' disease in his sister; Heart attack in his father; Stroke in his mother.   ROS:  Please see the history of present illness.   All other systems are personally reviewed and negative.   Vitals:   03/13/21 1457  BP: 110/60  Pulse: 60  SpO2: 96%    Exam: General:  Elderly male. No acute distress. HEENT: normal Neck: supple. JVP 6. Carotids 2+ bilat; no bruits. No lymphadenopathy or thryomegaly appreciated. Cor: PMI nondisplaced. Regular rate & rhythm. No rubs, gallops, 2/6 AS murmur s2 ok  Lungs: Decreased breath sounds in bases. Right-sided  Pleurex catheter Abdomen: soft, nontender, nondistended. No hepatosplenomegaly. No bruits or masses. Good bowel sounds. Extremities: no cyanosis, clubbing, rash, 1+ edema +dystrophic nails Neuro: alert & orientedx3, cranial nerves grossly intact. moves all 4 extremities w/o difficulty. Affect pleasant    Recent Labs: 02/01/2021: ALT 8; B Natriuretic Peptide 96.4 02/22/2021: BUN 32; Creatinine, Ser 1.14; Potassium 3.8; Sodium 139  Personally reviewed   Wt Readings from Last 3 Encounters:  03/13/21 62.1 kg (137 lb)  02/22/21 62.9 kg (138 lb 9.6 oz)  02/01/21 63.5 kg (140 lb)      ASSESSMENT AND PLAN:  1. Chronic diastolic HF - Doing well NYHA II - Echo today 03/13/21 EF 55% RV normal. AoV moderately calcified.  No significant AS Personally reviewed - Continue torsemide 60 mg bid + metolazone 2.5 weekly. - Continue spiro 12.5 daily.  - Have decided to defer Jardiance at this time - Labs today  2. 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 - Myeloma panel with small M-spike with IgG monoclonal with kappa light chains. Likely MGUS - S/p bilat pleurex tubes  - Followed closely by Stuart Stuart Watson. Left CT removed with stable small to moderate left effusion. R Pleurex working well. Drainage now decreased with metolazone use and increasing torsemide. Will keep Pleurex until drainage less than 200 mL weekly - Currently draining 500cc/week. Continue current plan - No somatostatin for now.   3. Coronary calcifications and abnormal stress test - LHC (12/19) minimal CAD - No s/s ischemia   4. Gout - Continue allopurinol. - No further issues.  5. Abnormal aortic valve - mildly calcified on echo. No significant AS  6. Hand cramps - check electrolytes. Supp as needed   Glori Bickers, Watson  4:22 PM

## 2021-03-13 NOTE — Progress Notes (Signed)
  Echocardiogram 2D Echocardiogram has been performed.  Stuart Watson 03/13/2021, 2:38 PM

## 2021-03-13 NOTE — Patient Instructions (Addendum)
Labs done today. We will contact you only if your labs are abnormal.  No medication changes were made. Please continue all current medications as prescribed.  Your physician recommends that you schedule a follow-up appointment in: 6 months. Please contact our office in January to schedule a February appointment.   If you have any questions or concerns before your next appointment please send [place] a message through [place] or call our office at [phone].    TO LEAVE A MESSAGE FOR THE NURSE SELECT OPTION 2, PLEASE LEAVE A MESSAGE INCLUDING: YOUR NAME DATE OF BIRTH CALL BACK NUMBER REASON FOR CALL**this is important as we prioritize the call backs  YOU WILL RECEIVE A CALL BACK THE SAME DAY AS LONG AS YOU CALL BEFORE 4:00 PM   Do the following things EVERYDAY: Weigh yourself in the morning before breakfast. Write it down and keep it in a log. Take your medicines as prescribed Eat low salt foods--Limit salt (sodium) to 2000 mg per day.  Stay as active as you can everyday Limit all fluids for the day to less than 2 liters   At the [facility], you and your health needs are our priority. As part of our continuing mission to provide you with exceptional heart care, we have created designated Provider Care Teams. These Care Teams include your primary Cardiologist (physician) and Advanced Practice Providers (APPs- Physician Assistants and Nurse Practitioners) who all work together to provide you with the care you need, when you need it.   You may see any of the following providers on your designated Care Team at your next follow up: Dr Glori Bickers Dr Haynes Kerns, NP Lyda Jester, [place] Audry Riles, PharmD   Please be sure to bring in all your medications bottles to every appointment.

## 2021-03-14 DIAGNOSIS — R1312 Dysphagia, oropharyngeal phase: Secondary | ICD-10-CM | POA: Diagnosis not present

## 2021-03-14 DIAGNOSIS — R4789 Other speech disturbances: Secondary | ICD-10-CM | POA: Diagnosis not present

## 2021-03-14 DIAGNOSIS — F0390 Unspecified dementia without behavioral disturbance: Secondary | ICD-10-CM | POA: Diagnosis not present

## 2021-03-16 DIAGNOSIS — F0390 Unspecified dementia without behavioral disturbance: Secondary | ICD-10-CM | POA: Diagnosis not present

## 2021-03-16 DIAGNOSIS — R4789 Other speech disturbances: Secondary | ICD-10-CM | POA: Diagnosis not present

## 2021-03-16 DIAGNOSIS — R1312 Dysphagia, oropharyngeal phase: Secondary | ICD-10-CM | POA: Diagnosis not present

## 2021-03-23 DIAGNOSIS — F0390 Unspecified dementia without behavioral disturbance: Secondary | ICD-10-CM | POA: Diagnosis not present

## 2021-03-23 DIAGNOSIS — R1312 Dysphagia, oropharyngeal phase: Secondary | ICD-10-CM | POA: Diagnosis not present

## 2021-03-23 DIAGNOSIS — R4789 Other speech disturbances: Secondary | ICD-10-CM | POA: Diagnosis not present

## 2021-03-28 DIAGNOSIS — R4789 Other speech disturbances: Secondary | ICD-10-CM | POA: Diagnosis not present

## 2021-03-28 DIAGNOSIS — F0390 Unspecified dementia without behavioral disturbance: Secondary | ICD-10-CM | POA: Diagnosis not present

## 2021-03-28 DIAGNOSIS — R1312 Dysphagia, oropharyngeal phase: Secondary | ICD-10-CM | POA: Diagnosis not present

## 2021-03-30 DIAGNOSIS — F0390 Unspecified dementia without behavioral disturbance: Secondary | ICD-10-CM | POA: Diagnosis not present

## 2021-03-30 DIAGNOSIS — R1312 Dysphagia, oropharyngeal phase: Secondary | ICD-10-CM | POA: Diagnosis not present

## 2021-03-30 DIAGNOSIS — R4789 Other speech disturbances: Secondary | ICD-10-CM | POA: Diagnosis not present

## 2021-04-06 DIAGNOSIS — R4789 Other speech disturbances: Secondary | ICD-10-CM | POA: Diagnosis not present

## 2021-04-06 DIAGNOSIS — F0390 Unspecified dementia without behavioral disturbance: Secondary | ICD-10-CM | POA: Diagnosis not present

## 2021-04-06 DIAGNOSIS — R1312 Dysphagia, oropharyngeal phase: Secondary | ICD-10-CM | POA: Diagnosis not present

## 2021-04-11 DIAGNOSIS — F0390 Unspecified dementia without behavioral disturbance: Secondary | ICD-10-CM | POA: Diagnosis not present

## 2021-04-11 DIAGNOSIS — R4789 Other speech disturbances: Secondary | ICD-10-CM | POA: Diagnosis not present

## 2021-04-11 DIAGNOSIS — R1312 Dysphagia, oropharyngeal phase: Secondary | ICD-10-CM | POA: Diagnosis not present

## 2021-04-13 DIAGNOSIS — R4789 Other speech disturbances: Secondary | ICD-10-CM | POA: Diagnosis not present

## 2021-04-13 DIAGNOSIS — F0390 Unspecified dementia without behavioral disturbance: Secondary | ICD-10-CM | POA: Diagnosis not present

## 2021-04-13 DIAGNOSIS — R1312 Dysphagia, oropharyngeal phase: Secondary | ICD-10-CM | POA: Diagnosis not present

## 2021-04-18 DIAGNOSIS — R1312 Dysphagia, oropharyngeal phase: Secondary | ICD-10-CM | POA: Diagnosis not present

## 2021-04-18 DIAGNOSIS — F0390 Unspecified dementia without behavioral disturbance: Secondary | ICD-10-CM | POA: Diagnosis not present

## 2021-04-18 DIAGNOSIS — R4789 Other speech disturbances: Secondary | ICD-10-CM | POA: Diagnosis not present

## 2021-04-20 DIAGNOSIS — F0390 Unspecified dementia without behavioral disturbance: Secondary | ICD-10-CM | POA: Diagnosis not present

## 2021-04-20 DIAGNOSIS — Z23 Encounter for immunization: Secondary | ICD-10-CM | POA: Diagnosis not present

## 2021-04-20 DIAGNOSIS — R1312 Dysphagia, oropharyngeal phase: Secondary | ICD-10-CM | POA: Diagnosis not present

## 2021-04-20 DIAGNOSIS — R4789 Other speech disturbances: Secondary | ICD-10-CM | POA: Diagnosis not present

## 2021-04-24 DIAGNOSIS — L570 Actinic keratosis: Secondary | ICD-10-CM | POA: Diagnosis not present

## 2021-04-24 DIAGNOSIS — Z85828 Personal history of other malignant neoplasm of skin: Secondary | ICD-10-CM | POA: Diagnosis not present

## 2021-04-24 DIAGNOSIS — D2261 Melanocytic nevi of right upper limb, including shoulder: Secondary | ICD-10-CM | POA: Diagnosis not present

## 2021-04-24 DIAGNOSIS — D1801 Hemangioma of skin and subcutaneous tissue: Secondary | ICD-10-CM | POA: Diagnosis not present

## 2021-04-24 DIAGNOSIS — L603 Nail dystrophy: Secondary | ICD-10-CM | POA: Diagnosis not present

## 2021-04-24 DIAGNOSIS — L821 Other seborrheic keratosis: Secondary | ICD-10-CM | POA: Diagnosis not present

## 2021-04-25 ENCOUNTER — Other Ambulatory Visit (HOSPITAL_COMMUNITY): Payer: Self-pay | Admitting: Internal Medicine

## 2021-04-26 ENCOUNTER — Other Ambulatory Visit (HOSPITAL_COMMUNITY): Payer: Self-pay | Admitting: *Deleted

## 2021-04-26 MED ORDER — SPIRONOLACTONE 25 MG PO TABS: 12.5000 mg | ORAL_TABLET | Freq: Every day | ORAL | 3 refills | Status: DC

## 2021-04-27 DIAGNOSIS — Z23 Encounter for immunization: Secondary | ICD-10-CM | POA: Diagnosis not present

## 2021-04-28 ENCOUNTER — Other Ambulatory Visit (HOSPITAL_COMMUNITY): Payer: Self-pay | Admitting: *Deleted

## 2021-04-28 MED ORDER — SPIRONOLACTONE 25 MG PO TABS: 12.5000 mg | ORAL_TABLET | Freq: Every day | ORAL | 3 refills | Status: DC

## 2021-05-01 DIAGNOSIS — H4720 Unspecified optic atrophy: Secondary | ICD-10-CM | POA: Diagnosis not present

## 2021-05-01 DIAGNOSIS — Z961 Presence of intraocular lens: Secondary | ICD-10-CM | POA: Diagnosis not present

## 2021-05-01 DIAGNOSIS — H524 Presbyopia: Secondary | ICD-10-CM | POA: Diagnosis not present

## 2021-05-01 DIAGNOSIS — H52203 Unspecified astigmatism, bilateral: Secondary | ICD-10-CM | POA: Diagnosis not present

## 2021-05-05 ENCOUNTER — Other Ambulatory Visit: Payer: Self-pay

## 2021-05-05 ENCOUNTER — Ambulatory Visit (INDEPENDENT_AMBULATORY_CARE_PROVIDER_SITE_OTHER): Payer: Medicare Other | Admitting: Thoracic Surgery (Cardiothoracic Vascular Surgery)

## 2021-05-05 DIAGNOSIS — J90 Pleural effusion, not elsewhere classified: Secondary | ICD-10-CM

## 2021-05-05 DIAGNOSIS — Q6689 Other  specified congenital deformities of feet: Secondary | ICD-10-CM | POA: Diagnosis not present

## 2021-05-05 DIAGNOSIS — B351 Tinea unguium: Secondary | ICD-10-CM | POA: Diagnosis not present

## 2021-05-05 NOTE — Progress Notes (Signed)
     [address]       [place] [address]             [phone]       Patient: Home Provider: Office Consent for Telemedicine visit obtained.  Today's visit was completed via a real-time telehealth (see specific modality noted below). The patient/authorized person provided oral consent at the time of the visit to engage in a telemedicine encounter with the present provider at [facility]. The patient/authorized person was informed of the potential benefits, limitations, and risks of telemedicine. The patient/authorized person expressed understanding that the laws that protect confidentiality also apply to telemedicine. The patient/authorized person acknowledged understanding that telemedicine does not provide emergency services and that he or she would need to call 911 or proceed to the nearest hospital for help if such a need arose.   Total time spent in the clinical discussion 10 minutes.  Telehealth Modality: Phone visit (audio only)  I had a telephone visit with Mr. Baskin.  Overall doing well.  He continues to have lots of mucus and secretions.  He has been only drawing fluid off once weekly and averages around 600.  He was seen by Dr. [name] and had changes made to his diuretics.  Unfortunately there does not appear to be much change in his output.  I have recommended that he pull twice weekly.  Will remove drain once output less than 23mls.  Will f/u in 3 months  [place]

## 2021-05-09 DIAGNOSIS — J918 Pleural effusion in other conditions classified elsewhere: Secondary | ICD-10-CM | POA: Diagnosis not present

## 2021-05-17 DIAGNOSIS — J90 Pleural effusion, not elsewhere classified: Secondary | ICD-10-CM | POA: Diagnosis not present

## 2021-05-17 DIAGNOSIS — F0390 Unspecified dementia without behavioral disturbance: Secondary | ICD-10-CM | POA: Diagnosis not present

## 2021-06-24 DIAGNOSIS — J918 Pleural effusion in other conditions classified elsewhere: Secondary | ICD-10-CM | POA: Diagnosis not present

## 2021-06-29 DIAGNOSIS — Z20822 Contact with and (suspected) exposure to covid-19: Secondary | ICD-10-CM | POA: Diagnosis not present

## 2021-07-10 ENCOUNTER — Other Ambulatory Visit: Payer: Self-pay

## 2021-07-10 ENCOUNTER — Other Ambulatory Visit (HOSPITAL_COMMUNITY): Payer: Self-pay | Admitting: *Deleted

## 2021-07-10 MED ORDER — POTASSIUM CHLORIDE CRYS ER 20 MEQ PO TBCR: 60.0000 meq | EXTENDED_RELEASE_TABLET | Freq: Every day | ORAL | 3 refills | Status: DC

## 2021-07-10 NOTE — Telephone Encounter (Signed)
This is a CHF pt 

## 2021-07-11 MED ORDER — ALLOPURINOL 300 MG PO TABS: 150.0000 mg | ORAL_TABLET | Freq: Every day | ORAL | 0 refills | Status: DC

## 2021-08-09 DIAGNOSIS — J918 Pleural effusion in other conditions classified elsewhere: Secondary | ICD-10-CM | POA: Diagnosis not present

## 2021-08-11 ENCOUNTER — Telehealth: Payer: Medicare Other | Admitting: Thoracic Surgery (Cardiothoracic Vascular Surgery)

## 2021-08-11 ENCOUNTER — Other Ambulatory Visit: Payer: Self-pay

## 2021-08-17 DIAGNOSIS — I700 Atherosclerosis of aorta: Secondary | ICD-10-CM | POA: Diagnosis not present

## 2021-08-17 DIAGNOSIS — M1990 Unspecified osteoarthritis, unspecified site: Secondary | ICD-10-CM | POA: Diagnosis not present

## 2021-08-17 DIAGNOSIS — J90 Pleural effusion, not elsewhere classified: Secondary | ICD-10-CM | POA: Diagnosis not present

## 2021-08-17 DIAGNOSIS — R972 Elevated prostate specific antigen [PSA]: Secondary | ICD-10-CM | POA: Diagnosis not present

## 2021-08-17 DIAGNOSIS — E7800 Pure hypercholesterolemia, unspecified: Secondary | ICD-10-CM | POA: Diagnosis not present

## 2021-08-17 DIAGNOSIS — Z0000 Encounter for general adult medical examination without abnormal findings: Secondary | ICD-10-CM | POA: Diagnosis not present

## 2021-08-17 DIAGNOSIS — Z1389 Encounter for screening for other disorder: Secondary | ICD-10-CM | POA: Diagnosis not present

## 2021-08-17 DIAGNOSIS — I69398 Other sequelae of cerebral infarction: Secondary | ICD-10-CM | POA: Diagnosis not present

## 2021-08-17 DIAGNOSIS — E039 Hypothyroidism, unspecified: Secondary | ICD-10-CM | POA: Diagnosis not present

## 2021-08-17 DIAGNOSIS — G459 Transient cerebral ischemic attack, unspecified: Secondary | ICD-10-CM | POA: Diagnosis not present

## 2021-08-17 DIAGNOSIS — K219 Gastro-esophageal reflux disease without esophagitis: Secondary | ICD-10-CM | POA: Diagnosis not present

## 2021-08-17 DIAGNOSIS — M353 Polymyalgia rheumatica: Secondary | ICD-10-CM | POA: Diagnosis not present

## 2021-08-17 DIAGNOSIS — F0390 Unspecified dementia without behavioral disturbance: Secondary | ICD-10-CM | POA: Diagnosis not present

## 2021-08-17 DIAGNOSIS — M109 Gout, unspecified: Secondary | ICD-10-CM | POA: Diagnosis not present

## 2021-08-21 ENCOUNTER — Other Ambulatory Visit (HOSPITAL_COMMUNITY): Payer: Self-pay | Admitting: *Deleted

## 2021-08-21 ENCOUNTER — Ambulatory Visit
Admission: RE | Admit: 2021-08-21 | Discharge: 2021-08-21 | Disposition: A | Discharge status: Home / Self Care | Payer: Medicare Other | Source: Ambulatory Visit | Attending: Cardiothoracic Surgery | Admitting: Cardiothoracic Surgery

## 2021-08-21 ENCOUNTER — Other Ambulatory Visit: Payer: Self-pay

## 2021-08-21 ENCOUNTER — Ambulatory Visit (INDEPENDENT_AMBULATORY_CARE_PROVIDER_SITE_OTHER): Payer: Medicare Other | Admitting: Cardiothoracic Surgery

## 2021-08-21 ENCOUNTER — Encounter: Payer: Self-pay | Admitting: Cardiothoracic Surgery

## 2021-08-21 ENCOUNTER — Other Ambulatory Visit: Payer: Self-pay | Admitting: Cardiothoracic Surgery

## 2021-08-21 VITALS — BP 119/62 | HR 64 | Resp 20 | Ht 66.0 in | Wt 135.0 lb

## 2021-08-21 DIAGNOSIS — J90 Pleural effusion, not elsewhere classified: Secondary | ICD-10-CM | POA: Diagnosis not present

## 2021-08-21 DIAGNOSIS — Z9189 Other specified personal risk factors, not elsewhere classified: Secondary | ICD-10-CM | POA: Diagnosis not present

## 2021-08-21 MED ORDER — SPIRONOLACTONE 25 MG PO TABS: 12.5000 mg | ORAL_TABLET | Freq: Every day | ORAL | 3 refills | Status: DC

## 2021-08-21 NOTE — Progress Notes (Signed)
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  He with catheter has been removed.  He currently drains approximately 400 mL twice weekly from the right catheter.  He is walking 1 mile per day.  He denies shortness of breath cough or pleuritic pain.  Catheter site is clean and dry.  He has had not had a chest x-ray in several months so we will get a chest x-ray on him today.   Current Outpatient Medications  Medication Sig Dispense Refill   acetaminophen (TYLENOL) 500 MG tablet Take 500 mg by mouth daily as needed for moderate pain.      allopurinol (ZYLOPRIM) 300 MG tablet Take 0.5 tablets (150 mg total) by mouth daily. 90 tablet 0   aspirin EC 81 MG tablet Take 81 mg by mouth daily.     calcium carbonate (TUMS - DOSED IN MG ELEMENTAL CALCIUM) 500 MG chewable tablet Chew 1 tablet by mouth daily as needed for indigestion.     cetirizine (ZYRTEC) 10 MG tablet Take 10 mg by mouth daily as needed for allergies.      cholecalciferol (VITAMIN D3) 25 MCG (1000 UNIT) tablet Take 1,000 Units by mouth daily.     donepezil (ARICEPT) 5 MG tablet Take 5 mg by mouth at bedtime.     doxazosin (CARDURA) 8 MG tablet Take 8 mg by mouth daily.     fluticasone (FLONASE) 50 MCG/ACT nasal spray Place 1 spray into both nostrils daily as needed for allergies or rhinitis.      guaiFENesin (MUCINEX) 600 MG 12 hr tablet Take 600 mg by mouth as needed for cough or to loosen phlegm.      levothyroxine (SYNTHROID) 25 MCG tablet Take 25 mcg by mouth daily.     metolazone (ZAROXOLYN) 2.5 MG tablet Take 1 tablet (2.5 mg total) by mouth every Wednesday. 20 tablet 3   Omega-3 Fatty Acids (FISH OIL PO) Take 1 tablet by mouth every other day.      pantoprazole (PROTONIX) 20 MG tablet Take 20 mg by mouth daily.      Polyethyl Glycol-Propyl Glycol (SYSTANE OP) Place 1 drop into both eyes 2 (two) times daily as needed (dry eyes).      potassium chloride SA (KLOR-CON M) 20 MEQ tablet Take 3 tablets (60 mEq total) by mouth daily. Take extra 2 tabs when you take Metolazone 570 tablet 3   simvastatin (ZOCOR) 20 MG tablet Take 20 mg by mouth daily.     spironolactone (ALDACTONE) 25 MG tablet Take 0.5 tablets (12.5 mg total) by mouth daily. 45 tablet 3   torsemide (DEMADEX) 20 MG tablet Take 3 tablets (60 mg total) by mouth 2 (two) times daily. 90 tablet 0   vitamin C (ASCORBIC ACID) 250 MG tablet Take 250 mg by mouth daily.     No current facility-administered medications for this visit.    Physical Exam: \Breath sounds clear bilaterally Regular sinus rhythm Systolic flow murmur unchanged  Diagnostic Tests Today's chest x-ray is pending.  Previous chest x-ray shows a Pleurx catheter in good position pleural effusion  Impression: Patient doing well right Pleurx catheter continue twice weekly drainage.  Plan: Chest x-ray today Phone visit in 2 to 3 months He will let us know if his symptoms change. He understands the right catheter will probably be permanent.   [name], MD [facility] [phone]

## 2021-09-04 ENCOUNTER — Other Ambulatory Visit (HOSPITAL_COMMUNITY): Payer: Self-pay

## 2021-09-04 MED ORDER — METOLAZONE 2.5 MG PO TABS: 2.5000 mg | ORAL_TABLET | ORAL | 3 refills | Status: DC

## 2021-09-10 ENCOUNTER — Other Ambulatory Visit (HOSPITAL_COMMUNITY): Payer: Self-pay | Admitting: Internal Medicine

## 2021-09-12 DIAGNOSIS — J918 Pleural effusion in other conditions classified elsewhere: Secondary | ICD-10-CM | POA: Diagnosis not present

## 2021-09-14 ENCOUNTER — Other Ambulatory Visit: Payer: Self-pay

## 2021-09-14 ENCOUNTER — Encounter (HOSPITAL_COMMUNITY): Payer: Self-pay | Admitting: Internal Medicine

## 2021-09-14 ENCOUNTER — Ambulatory Visit (HOSPITAL_COMMUNITY)
Admission: RE | Admit: 2021-09-14 | Discharge: 2021-09-14 | Disposition: A | Discharge status: Home / Self Care | Payer: Medicare Other | Source: Ambulatory Visit | Attending: Internal Medicine | Admitting: Internal Medicine

## 2021-09-14 VITALS — BP 118/60 | HR 83 | Ht 66.0 in | Wt 135.2 lb

## 2021-09-14 DIAGNOSIS — J90 Pleural effusion, not elsewhere classified: Secondary | ICD-10-CM | POA: Insufficient documentation

## 2021-09-14 DIAGNOSIS — M109 Gout, unspecified: Secondary | ICD-10-CM | POA: Insufficient documentation

## 2021-09-14 DIAGNOSIS — I5032 Chronic diastolic (congestive) heart failure: Secondary | ICD-10-CM | POA: Diagnosis not present

## 2021-09-14 DIAGNOSIS — L605 Yellow nail syndrome: Secondary | ICD-10-CM | POA: Diagnosis not present

## 2021-09-14 DIAGNOSIS — I509 Heart failure, unspecified: Secondary | ICD-10-CM | POA: Diagnosis not present

## 2021-09-14 DIAGNOSIS — R609 Edema, unspecified: Secondary | ICD-10-CM

## 2021-09-14 DIAGNOSIS — Z79899 Other long term (current) drug therapy: Secondary | ICD-10-CM | POA: Insufficient documentation

## 2021-09-14 DIAGNOSIS — R9439 Abnormal result of other cardiovascular function study: Secondary | ICD-10-CM | POA: Insufficient documentation

## 2021-09-14 DIAGNOSIS — Z8673 Personal history of transient ischemic attack (TIA), and cerebral infarction without residual deficits: Secondary | ICD-10-CM | POA: Diagnosis not present

## 2021-09-14 DIAGNOSIS — I2510 Atherosclerotic heart disease of native coronary artery without angina pectoris: Secondary | ICD-10-CM | POA: Diagnosis not present

## 2021-09-14 DIAGNOSIS — Z09 Encounter for follow-up examination after completed treatment for conditions other than malignant neoplasm: Secondary | ICD-10-CM | POA: Diagnosis not present

## 2021-09-14 LAB — BASIC METABOLIC PANEL
Anion gap: 10 (ref 5–15)
BUN: 28 mg/dL — ABNORMAL HIGH (ref 8–23)
CO2: 29 mmol/L (ref 22–32)
Calcium: 8.4 mg/dL — ABNORMAL LOW (ref 8.9–10.3)
Chloride: 99 mmol/L (ref 98–111)
Creatinine, Ser: 1.5 mg/dL — ABNORMAL HIGH (ref 0.61–1.24)
GFR, Estimated: 45 mL/min — ABNORMAL LOW (ref >60)
Glucose, Bld: 118 mg/dL — ABNORMAL HIGH (ref 70–99)
Potassium: 3.5 mmol/L (ref 3.5–5.1)
Sodium: 138 mmol/L (ref 135–145)

## 2021-09-14 NOTE — Addendum Note (Signed)
Encounter addended by: Jerl Mina, RN on: 09/14/2021 2:28 PM  Actions taken: Visit diagnoses modified, Order list changed, Diagnosis association updated, Clinical Note Signed

## 2021-09-14 NOTE — Patient Instructions (Signed)
Thank you for your visit today.  There has been no changes to your medications.  Labs done today, your results will be available in MyChart, we will contact you for abnormal readings.  You have been referred to [facility]. They will call you with an appointment.  Your physician recommends that you schedule a follow-up appointment in: 6 months (August 2023)  ** please call the office in June for your follow up appointment**.  If you have any questions or concerns before your next appointment please send [place] a message through [place] or call our office at [phone].    TO LEAVE A MESSAGE FOR THE NURSE SELECT OPTION 2, PLEASE LEAVE A MESSAGE INCLUDING: YOUR NAME DATE OF BIRTH CALL BACK NUMBER REASON FOR CALL**this is important as we prioritize the call backs  YOU WILL RECEIVE A CALL BACK THE SAME DAY AS LONG AS YOU CALL BEFORE 4:00 PM  At the [facility], you and your health needs are our priority. As part of our continuing mission to provide you with exceptional heart care, we have created designated Provider Care Teams. These Care Teams include your primary Cardiologist (physician) and Advanced Practice Providers (APPs- Physician Assistants and Nurse Practitioners) who all work together to provide you with the care you need, when you need it.   You may see any of the following providers on your designated Care Team at your next follow up: Dr Glori Bickers Dr Haynes Kerns, NP Lyda Jester, [place] [facility], [place] Audry Riles, PharmD   Please be sure to bring in all your medications bottles to every appointment.

## 2021-09-14 NOTE — Progress Notes (Signed)
Heart Failure Clinic Note   Date:  09/14/2021   [name], DOB 02-02-1932, MRN [id number]  Location: Home  Provider location: [facility] Type of Visit: Established patient, Add on  PCP:  Stuart Low, MD  Primary HF: Dr Stuart Watson    History of Present Illness: [name] is an 88 y/o male with h/o temporal arteritis/polymalgia rheumatica, previous TIA/CVA, and recurrent pleural effusions s/p bilateral Pleurex tubes.  Has been following with Dr. Prescott Watson. His left Pleurex stopped draining and had to be replaced. Initially the new catheter drained fairly well but it stopped draining and CT scan showed a moderate left effusion.  Dr. Prescott Watson removed the left Pleurex tube 9/21 and CXR in 10/21 showed stable mild to moderate left effusion. Now follows with Dr. Kipp Watson.   Has been using metolazone 2.5 mg weekly since November 2021 for persistent volume overload.  Seen for f/u on 02/01/21 d/t increased pleurex drainage, up to 1L one day and 700 cc 3 days later. Torsemide increased which helped with slowing drainage. Treated for gout flare with prednisone and allopurinol.  Today he returns for HF follow up.Overall feeling fine. Denies SOB/PND/Orthopnea. Says on metolazone days he is washed out. Appetite ok. No fever or chills. Weight at home 130 pounds. Draining pleurex catheter twice a week ~ 350 cc.  Taking all medications. Lives at [facility].   Echo 03/13/21 EF 55% RV normal. AoV moderately calcified.  No significant AS Personally reviewed   Past Medical History:  Diagnosis Date   Actinic keratoses    Arthritis    CHF (congestive heart failure) (HCC)    Chronic bilateral pleural effusions    Chronic sinusitis    Colon polyps    Dyspnea 07/13/2013   BNP normal   Edema 07/13/2013   GERD (gastroesophageal reflux disease)    omeprazole 1-2 times daily   H/O TIA (transient ischemic attack) and stroke    on statins and plavix -saw  Dr.Sethi in the past   Headache    History of BPH    History of TIA (transient ischemic attack) 07/13/2013   Left inguinal hernia    Left inguinal hernia 01/27/2019   Lumbar radiculopathy    Need for shingles vaccine    Optic neuritis 10/2015   left eye   Perennial allergic rhinitis    Polymyalgia rheumatica (Stuart Watson) 07/13/2013   Daily prednisone   Restless leg syndrome    Retinal disease    right eye since birth    Shingles    Temporal arteritis ([place]) 10/2015   Wears glasses    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   Past Surgical History:  Procedure Laterality Date   BRAVO [place] STUDY N/A 04/18/2016   Procedure: BRAVO [place] STUDY;  Surgeon: Wilford Corner, MD;  Location: [facility];  Service: Endoscopy;  Laterality: N/A;   CHEST TUBE INSERTION Right 02/13/2018   Procedure: INSERTION PLEURAL DRAINAGE CATHETER;  Surgeon: Ivin Poot, MD;  Location: [place];  Service: Thoracic;  Laterality: Right;   CHEST TUBE INSERTION Left 11/27/2019   Procedure: REDO INSERTION PLEURAL DRAINAGE CATHETER USING 15.5 FR. [place] CATH;  Surgeon: Stuart Watson, Collier Salina, MD;  Location: [place];  Service: Thoracic;  Laterality: Left;   COLONOSCOPY     drropy eyelid surgery     ESOPHAGOGASTRODUODENOSCOPY (EGD) WITH PROPOFOL N/A 04/18/2016   Procedure: ESOPHAGOGASTRODUODENOSCOPY (EGD) WITH PROPOFOL;  Surgeon: Wilford Corner, MD;  Location: [facility];  Service: Endoscopy;  Laterality: N/A;   INGUINAL HERNIA REPAIR Left 01/27/2019   Procedure: OPEN REPAIR LEFT INGUINAL HERNIA WITH MESH;  Surgeon: Fanny Skates, MD;  Location: [place];  Service: General;  Laterality: Left;  GENERAL AND TAP BLOCK ANESTHESIA   INSERTION OF MESH Left 01/27/2019   Procedure: Insertion Of Mesh;  Surgeon: Fanny Skates, MD;  Location: [place];  Service: General;  Laterality: Left;   IR THORACENTESIS ASP PLEURAL SPACE W/IMG GUIDE  02/04/2018   IR THORACENTESIS ASP PLEURAL SPACE W/IMG GUIDE  02/06/2018   REMOVAL OF PLEURAL DRAINAGE CATHETER Left 11/27/2019    Procedure: REMOVAL OF PLEURAL DRAINAGE CATHETER;  Surgeon: Ivin Poot, MD;  Location: [place];  Service: Thoracic;  Laterality: Left;   REMOVAL OF PLEURAL DRAINAGE CATHETER Left 03/31/2020   Procedure: REMOVAL OF PLEURAL DRAINAGE CATHETER;  Surgeon: Ivin Poot, MD;  Location: [place];  Service: Thoracic;  Laterality: Left;   RIGHT/LEFT HEART CATH AND CORONARY ANGIOGRAPHY N/A 06/24/2018   Procedure: RIGHT/LEFT HEART CATH AND CORONARY ANGIOGRAPHY;  Surgeon: Jolaine Artist, MD;  Location: [place] [facility];  Service: Cardiovascular;  Laterality: N/A;   VASECTOMY       Current Outpatient Medications  Medication Sig Dispense Refill   acetaminophen (TYLENOL) 500 MG tablet Take 500 mg by mouth daily as needed for moderate pain.      allopurinol (ZYLOPRIM) 300 MG tablet Take 0.5 tablets (150 mg total) by mouth daily. 90 tablet 0   aspirin EC 81 MG tablet Take 81 mg by mouth daily.     calcium carbonate (TUMS - DOSED IN MG ELEMENTAL CALCIUM) 500 MG chewable tablet Chew 1 tablet by mouth daily as needed for indigestion.     cetirizine (ZYRTEC) 10 MG tablet Take 10 mg by mouth daily as needed for allergies.      cholecalciferol (VITAMIN D3) 25 MCG (1000 UNIT) tablet Take 1,000 Units by mouth daily.     donepezil (ARICEPT) 5 MG tablet Take 5 mg by mouth at bedtime.     doxazosin (CARDURA) 8 MG tablet Take 8 mg by mouth daily.     fluticasone (FLONASE) 50 MCG/ACT nasal spray Place 1 spray into both nostrils daily as needed for allergies or rhinitis.      guaiFENesin (MUCINEX) 600 MG 12 hr tablet Take 600 mg by mouth as needed for cough or to loosen phlegm.      levothyroxine (SYNTHROID) 25 MCG tablet Take 25 mcg by mouth daily.     metolazone (ZAROXOLYN) 2.5 MG tablet Take 1 tablet (2.5 mg total) by mouth every Wednesday. 20 tablet 3   Omega-3 Fatty Acids (FISH OIL PO) Take 1 tablet by mouth every other day.      pantoprazole (PROTONIX) 20 MG tablet Take 20 mg by mouth daily.      Polyethyl  Glycol-Propyl Glycol (SYSTANE OP) Place 1 drop into both eyes 2 (two) times daily as needed (dry eyes).     potassium chloride SA (KLOR-CON M) 20 MEQ tablet Take 3 tablets (60 mEq total) by mouth daily. Take extra 2 tabs when you take Metolazone 570 tablet 3   simvastatin (ZOCOR) 20 MG tablet Take 20 mg by mouth daily.     spironolactone (ALDACTONE) 25 MG tablet Take 0.5 tablets (12.5 mg total) by mouth daily. 45 tablet 3   torsemide (DEMADEX) 20 MG tablet TAKE 3 TABLETS BY MOUTH IN  THE MORNING AND 2 TABLETS  IN THE EVENING 450 tablet 3   vitamin C (ASCORBIC  ACID) 250 MG tablet Take 250 mg by mouth daily.     No current facility-administered medications for this encounter.    Allergies:   Patient has no known allergies.   Social History:  The patient  reports that he quit smoking about 46 years ago. His smoking use included cigarettes. He has a 15.00 pack-year smoking history. He has never used smokeless tobacco. He reports current alcohol use of about 7.0 standard drinks per week. He reports that he does not use drugs.   Family History:  The patient's family history includes Breast cancer in his mother; Coronary artery disease in his father and mother; Diabetes in his father and mother; Berenice Primas' disease in his sister; Heart attack in his father; Stroke in his mother.   ROS:  Please see the history of present illness.   All other systems are personally reviewed and negative.   Vitals:   09/14/21 1348  BP: 118/60  Pulse: 83  SpO2: 97%   Wt Readings from Last 3 Encounters:  09/14/21 61.3 kg (135 lb 3.2 oz)  08/21/21 61.2 kg (135 lb)  03/13/21 62.1 kg (137 lb)     Exam: General: . No resp difficulty HEENT: normal Neck: supple. no JVD. Carotids 2+ bilat; no bruits. No lymphadenopathy or thryomegaly appreciated. Cor: PMI nondisplaced. Regular rate & rhythm. No rubs, gallops or murmurs. Lungs: clear Abdomen: soft, nontender, nondistended. No hepatosplenomegaly. No bruits or masses.  Good bowel sounds. Extremities: no cyanosis, clubbing, rash, R and LLE 2+ edema Neuro: alert & orientedx3, cranial nerves grossly intact. moves all 4 extremities w/o difficulty. Affect pleasant   Recent Labs: 02/01/2021: ALT 8 03/13/2021: B Natriuretic Peptide 45.1; BUN 34; Creatinine, Ser 1.26; Magnesium 3.0; Potassium 4.5; Sodium 138  Personally reviewed   Wt Readings from Last 3 Encounters:  09/14/21 61.3 kg (135 lb 3.2 oz)  08/21/21 61.2 kg (135 lb)  03/13/21 62.1 kg (137 lb)      ASSESSMENT AND PLAN:  1. Chronic diastolic HF - Echo  2/94/76 EF 55% RV normal. AoV moderately calcified.  No significant AS Personally reviewed - NYHA  III - Continue torsemide 60 mg /40 mg  + metolazone 2.5 weekly. - Continue spiro 12.5 daily.  - Check BMET    2. 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 - Myeloma panel with small M-spike with IgG monoclonal with kappa light chains. Likely MGUS - S/p bilat pleurex tubes  - Followed closely by Dr Stuart Watson. Left CT removed with stable small to moderate left effusion. R Pleurex working well. Drainage now decreased with metolazone use and increasing torsemide. Will keep Pleurex until drainage less than 200 mL weekly - Currently draining 700 cc/week. Continue current plan - No somatostatin for now.   3. Coronary calcifications and abnormal stress test - LHC (12/19) minimal CAD - No s/s ischemia   4. Gout - Continue allopurinol. - No further issues.  5. Abnormal aortic valve - mildly calcified on echo. No significant AS  Follow up in 6 months.    Darrick Grinder, NP  1:59 PM  Patient seen and examined with the above-signed Advanced Practice Provider and/or Housestaff. I personally reviewed laboratory data, imaging studies and relevant notes. I independently examined the patient and formulated the important aspects of the plan. I have edited the note to reflect any of my changes or salient points. I have personally  discussed the plan with the patient and/or family.  Both he and his wife says he feels more tired.  Sleeping more. Takes metolazone 1x/week and it wears him out. + stable edema and exertional SOB  General:  Elderly weak appearing. No resp difficulty HEENT: normal Neck: supple. no JVD. Carotids 2+ bilat; no bruits. No lymphadenopathy or thryomegaly appreciated. Cor: PMI nondisplaced. Regular rate & rhythm. No rubs, gallops or murmurs. Lungs: clear Abdomen: soft, nontender, nondistended. No hepatosplenomegaly. No bruits or masses. Good bowel sounds. Extremities: no cyanosis, clubbing, rash, 1-2+ edema  L> R + dystrophic nails Neuro: alert & orientedx3, cranial nerves grossly intact. moves all 4 extremities w/o difficulty. Affect flat. With masked facies  Stable from cardiac perspective. Has flat affect and masked facies concerning for early dementia or other neurologic process. Will refer to Neurology. Labs today  Glori Bickers, MD  2:22 PM

## 2021-09-27 ENCOUNTER — Telehealth (HOSPITAL_COMMUNITY): Payer: Self-pay | Admitting: *Deleted

## 2021-09-27 NOTE — Telephone Encounter (Signed)
MED list emailed to pts son [email or web address] ?

## 2021-09-28 ENCOUNTER — Telehealth (HOSPITAL_COMMUNITY): Payer: Self-pay | Admitting: Licensed Clinical Social Worker

## 2021-09-28 NOTE — Telephone Encounter (Signed)
Received VM from pt son inquiring about getting an fl2 filled out for pt to go into [facility]. ? ?CSW called back and informed pt son that they would need to go through pt PCP office to get this completed- son expressed understanding and confirms he knows how to get a hold of their office- no further needs at this time ? ?Jorge Ny, LCSW ?Clinical Social Worker ?[facility] ?Desk#: [phone] ?Cell#: [phone] ? ?

## 2021-09-29 ENCOUNTER — Telehealth (HOSPITAL_COMMUNITY): Payer: Self-pay | Admitting: *Deleted

## 2021-09-29 NOTE — Telephone Encounter (Signed)
Pts son left VM requesting return call to verify pts torsemide dose. I called son Doren Custard back no answer/left detailed vm with correct torsemide dose. ? ?Torsemide '20mg'$ -TAKE 3 TABLETS BY MOUTH IN THE MORNING AND 2 TABLETS IN THE EVENING ?

## 2021-10-01 NOTE — Progress Notes (Incomplete)
? ? ?Assessment/Plan:  ? ?Stuart Watson is a very pleasant 88 y.o. 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 Patient has a history of dementia and is on Donepezil 5 mg qhs since 02/2021 by his PCP, tolerating well. MoCA today is ? ? ? Recommendations:  ? ?Dementia ? ?MRI brain with/without contrast to assess for underlying structural abnormality and assess vascular load  ?Check B12  ?Discussed safety both in and out of the home.  ?Discussed the importance of regular daily schedule to maintain brain function.  ?Continue to monitor mood with PCP.  ?Monitor Driving ?Stay active at least 30 minutes at least 3 times a week.  ?Naps should be scheduled and should be no longer than 60 minutes and should not occur after 2 PM.  ?Control cardiovascular risk factors  ?Mediterranean diet is recommended  ?Folllow up in  months ? ?Subjective:  ? ? ?The patient is seen in neurologic consultation at the request of Wenda Low, MD for the evaluation of memory.  The patient is accompanied by  who supplements the history. ?This is a 88 y.o. year old RH  male who has had memory issues for about   ? ? ?Memory ?Repeats himself ?Disoriented when walking into a room ?Leaving objects  ?Ambulates   ?Falls ?Head injuries ?Wandering off  ?Drive ?Lives with ?Mood ?Depression ?Irritability ?CW puzzles Word Finding Board Games Painting Coloring ?Sleeps ?Vivid Dreams Sleepwalking ?Hallucinations ?Paranoia ?Hygiene concerns ?Bathing ?Dressing ?Medications pillbox ?Finances  ?Appetite  ?trouble swallowing.  ?Cooks.  stove on or the faucet on. ?  ?headaches, double vision, dizziness, focal numbness or tingling, unilateral weakness, tremors or anosmia. No history of seizures. Denies urine  incontinence, retention, constipation or diarrhea.  Denies OSA, ETOH or Tobacco. Family History ?  ?TSH 3.83,  ? ?No Known Allergies ? ?Current Outpatient Medications  ?Medication Instructions  ? acetaminophen (TYLENOL) 500 mg, Oral, Daily PRN  ? allopurinol (ZYLOPRIM) 150 mg, Oral, Daily  ? aspirin EC 81 mg, Oral, Daily  ? calcium carbonate (TUMS - DOSED IN MG ELEMENTAL CALCIUM) 500 MG chewable tablet 1 tablet, Oral, Daily PRN  ? cetirizine (ZYRTEC) 10 mg, Oral, Daily PRN  ? cholecalciferol (VITAMIN D3) 1,000 Units, Oral, Daily  ? donepezil (ARICEPT) 5 mg, Oral, Daily at bedtime  ? doxazosin (CARDURA) 8 mg, Daily  ? fluticasone (FLONASE) 50 MCG/ACT nasal spray 1 spray, Each Nare, Daily PRN  ? guaiFENesin (MUCINEX) 600 mg, Oral, As needed  ? levothyroxine (SYNTHROID) 25 mcg, Oral, Daily  ? metolazone (ZAROXOLYN) 2.5 mg, Oral, Every Wed  ? Omega-3 Fatty Acids (FISH OIL PO) 1 tablet, Oral, Every other day  ? pantoprazole (PROTONIX) 20 mg, Oral, Daily  ? Polyethyl Glycol-Propyl Glycol (SYSTANE OP) 1 drop, Both Eyes, 2 times daily PRN  ? potassium chloride SA (KLOR-CON M) 20 MEQ tablet 60 mEq, Oral, Daily, Take extra 2 tabs when you take Metolazone  ? simvastatin (ZOCOR) 20 mg, Daily  ? spironolactone (ALDACTONE) 12.5 mg, Oral, Daily  ? torsemide (DEMADEX) 20 MG tablet TAKE 3 TABLETS BY MOUTH IN  THE MORNING AND 2 TABLETS  IN THE EVENING  ? vitamin C (ASCORBIC ACID) 250 mg, Oral, Daily  ? ? ? ?VITALS:  There were no vitals filed for this  visit. ?  ? ?HEENT:  Normocephalic, atraumatic. The mucous membranes are moist. The superficial temporal arteries are without ropiness or tenderness. ?Cardiovascular: Regular rate and rhythm. ?Lungs: Clear to auscultation bilaterally. ?Neck: There are no carotid bruits noted bilaterally. ? ?NEUROLOGICAL: ?No flowsheet data found. ?No flowsheet data found.  ?No flowsheet data found.  ? ?Orientation:  Alert and oriented to person, place and time. No aphasia or dysarthria. Fund of  knowledge is appropriate. Recent memory impaired and remote memory intact.  Attention and concentration are normal.  Able to name objects and repeat phrases. Delayed recall   ?Cranial nerves: There is good facial symmetry. Extraocular muscles are intact and visual fields are full to confrontational testing. Speech is fluent and clear. Soft palate rises symmetrically and there is no tongue deviation. Hearing is intact to conversational tone. ?Tone: Tone is good throughout. ?Sensation: Sensation is intact to light touch and pinprick throughout. Vibration is intact at the bilateral big toe.There is no extinction with double simultaneous stimulation. There is no sensory dermatomal level identified. ?Coordination: The patient has no difficulty with RAM's or FNF bilaterally. Normal finger to nose  ?Motor: Strength is 5/5 in the bilateral upper and lower extremities. There is no pronator drift. There are no fasciculations noted. ?DTR's: Deep tendon reflexes are 2/4 at the bilateral biceps, triceps, brachioradialis, patella and achilles.  Plantar responses are downgoing bilaterally. ?Gait and Station: The patient is able to ambulate without difficulty.The patient is able to heel toe walk without any difficulty.The patient is able to ambulate in a tandem fashion. The patient is able to stand in the Romberg position. ?  ? ? ?Thank you for allowing [place] the opportunity to participate in the care of this nice patient. Please do not hesitate to contact us for any questions or concerns.  ? ?Total time spent on today's visit was 60 minutes, including both face-to-face time and nonface-to-face time.  Time included that spent on review of records (prior notes available to me/labs/imaging if pertinent), discussing treatment and goals, answering patient's questions and coordinating care. ? ?Cc:  Wenda Low, MD ? ?Sharene Butters ?10/01/2021 3:38 PM   ?

## 2021-10-02 ENCOUNTER — Encounter: Payer: Self-pay | Admitting: Physician Assistant

## 2021-10-02 ENCOUNTER — Ambulatory Visit: Payer: Medicare Other | Admitting: Physician Assistant

## 2021-10-02 DIAGNOSIS — Z029 Encounter for administrative examinations, unspecified: Secondary | ICD-10-CM

## 2021-10-06 ENCOUNTER — Ambulatory Visit: Payer: Medicare Other | Admitting: Physician Assistant

## 2021-10-07 DIAGNOSIS — Z20822 Contact with and (suspected) exposure to covid-19: Secondary | ICD-10-CM | POA: Diagnosis not present

## 2021-10-13 DIAGNOSIS — Z20828 Contact with and (suspected) exposure to other viral communicable diseases: Secondary | ICD-10-CM | POA: Diagnosis not present

## 2021-10-16 DIAGNOSIS — L605 Yellow nail syndrome: Secondary | ICD-10-CM | POA: Diagnosis not present

## 2021-10-16 DIAGNOSIS — I2510 Atherosclerotic heart disease of native coronary artery without angina pectoris: Secondary | ICD-10-CM | POA: Diagnosis not present

## 2021-10-16 DIAGNOSIS — F0390 Unspecified dementia without behavioral disturbance: Secondary | ICD-10-CM | POA: Diagnosis not present

## 2021-10-16 DIAGNOSIS — J90 Pleural effusion, not elsewhere classified: Secondary | ICD-10-CM | POA: Diagnosis not present

## 2021-10-16 DIAGNOSIS — I69398 Other sequelae of cerebral infarction: Secondary | ICD-10-CM | POA: Diagnosis not present

## 2021-10-16 DIAGNOSIS — I700 Atherosclerosis of aorta: Secondary | ICD-10-CM | POA: Diagnosis not present

## 2021-10-20 DIAGNOSIS — F0280 Dementia in other diseases classified elsewhere without behavioral disturbance: Secondary | ICD-10-CM | POA: Diagnosis not present

## 2021-10-20 DIAGNOSIS — R4789 Other speech disturbances: Secondary | ICD-10-CM | POA: Diagnosis not present

## 2021-10-20 DIAGNOSIS — J918 Pleural effusion in other conditions classified elsewhere: Secondary | ICD-10-CM | POA: Diagnosis not present

## 2021-10-23 DIAGNOSIS — F0280 Dementia in other diseases classified elsewhere without behavioral disturbance: Secondary | ICD-10-CM | POA: Diagnosis not present

## 2021-10-23 DIAGNOSIS — R278 Other lack of coordination: Secondary | ICD-10-CM | POA: Diagnosis not present

## 2021-10-23 DIAGNOSIS — L605 Yellow nail syndrome: Secondary | ICD-10-CM | POA: Diagnosis not present

## 2021-10-23 DIAGNOSIS — R4789 Other speech disturbances: Secondary | ICD-10-CM | POA: Diagnosis not present

## 2021-10-25 DIAGNOSIS — R4789 Other speech disturbances: Secondary | ICD-10-CM | POA: Diagnosis not present

## 2021-10-25 DIAGNOSIS — F0280 Dementia in other diseases classified elsewhere without behavioral disturbance: Secondary | ICD-10-CM | POA: Diagnosis not present

## 2021-10-25 DIAGNOSIS — R278 Other lack of coordination: Secondary | ICD-10-CM | POA: Diagnosis not present

## 2021-10-25 DIAGNOSIS — L605 Yellow nail syndrome: Secondary | ICD-10-CM | POA: Diagnosis not present

## 2021-10-27 DIAGNOSIS — F0280 Dementia in other diseases classified elsewhere without behavioral disturbance: Secondary | ICD-10-CM | POA: Diagnosis not present

## 2021-10-27 DIAGNOSIS — R4789 Other speech disturbances: Secondary | ICD-10-CM | POA: Diagnosis not present

## 2021-10-27 DIAGNOSIS — R278 Other lack of coordination: Secondary | ICD-10-CM | POA: Diagnosis not present

## 2021-10-27 DIAGNOSIS — L605 Yellow nail syndrome: Secondary | ICD-10-CM | POA: Diagnosis not present

## 2021-10-30 DIAGNOSIS — R4789 Other speech disturbances: Secondary | ICD-10-CM | POA: Diagnosis not present

## 2021-10-30 DIAGNOSIS — L605 Yellow nail syndrome: Secondary | ICD-10-CM | POA: Diagnosis not present

## 2021-10-30 DIAGNOSIS — R278 Other lack of coordination: Secondary | ICD-10-CM | POA: Diagnosis not present

## 2021-10-30 DIAGNOSIS — F0280 Dementia in other diseases classified elsewhere without behavioral disturbance: Secondary | ICD-10-CM | POA: Diagnosis not present

## 2021-10-31 DIAGNOSIS — R4789 Other speech disturbances: Secondary | ICD-10-CM | POA: Diagnosis not present

## 2021-10-31 DIAGNOSIS — F0280 Dementia in other diseases classified elsewhere without behavioral disturbance: Secondary | ICD-10-CM | POA: Diagnosis not present

## 2021-10-31 DIAGNOSIS — R278 Other lack of coordination: Secondary | ICD-10-CM | POA: Diagnosis not present

## 2021-10-31 DIAGNOSIS — L605 Yellow nail syndrome: Secondary | ICD-10-CM | POA: Diagnosis not present

## 2021-11-01 DIAGNOSIS — F0280 Dementia in other diseases classified elsewhere without behavioral disturbance: Secondary | ICD-10-CM | POA: Diagnosis not present

## 2021-11-01 DIAGNOSIS — R4789 Other speech disturbances: Secondary | ICD-10-CM | POA: Diagnosis not present

## 2021-11-01 DIAGNOSIS — L605 Yellow nail syndrome: Secondary | ICD-10-CM | POA: Diagnosis not present

## 2021-11-01 DIAGNOSIS — R278 Other lack of coordination: Secondary | ICD-10-CM | POA: Diagnosis not present

## 2021-11-02 ENCOUNTER — Non-Acute Institutional Stay: Payer: Medicare Other | Admitting: Internal Medicine

## 2021-11-02 ENCOUNTER — Encounter: Payer: Self-pay | Admitting: Internal Medicine

## 2021-11-02 DIAGNOSIS — E039 Hypothyroidism, unspecified: Secondary | ICD-10-CM | POA: Diagnosis not present

## 2021-11-02 DIAGNOSIS — N400 Enlarged prostate without lower urinary tract symptoms: Secondary | ICD-10-CM

## 2021-11-02 DIAGNOSIS — Z9689 Presence of other specified functional implants: Secondary | ICD-10-CM

## 2021-11-02 DIAGNOSIS — I5032 Chronic diastolic (congestive) heart failure: Secondary | ICD-10-CM

## 2021-11-02 DIAGNOSIS — M1A9XX0 Chronic gout, unspecified, without tophus (tophi): Secondary | ICD-10-CM | POA: Diagnosis not present

## 2021-11-02 DIAGNOSIS — J90 Pleural effusion, not elsewhere classified: Secondary | ICD-10-CM | POA: Diagnosis not present

## 2021-11-02 DIAGNOSIS — R278 Other lack of coordination: Secondary | ICD-10-CM | POA: Diagnosis not present

## 2021-11-02 DIAGNOSIS — F0280 Dementia in other diseases classified elsewhere without behavioral disturbance: Secondary | ICD-10-CM | POA: Diagnosis not present

## 2021-11-02 DIAGNOSIS — F03B0 Unspecified dementia, moderate, without behavioral disturbance, psychotic disturbance, mood disturbance, and anxiety: Secondary | ICD-10-CM | POA: Diagnosis not present

## 2021-11-02 DIAGNOSIS — R4789 Other speech disturbances: Secondary | ICD-10-CM | POA: Diagnosis not present

## 2021-11-02 DIAGNOSIS — L605 Yellow nail syndrome: Secondary | ICD-10-CM

## 2021-11-02 NOTE — Progress Notes (Signed)
?Provider:  Veleta Miners MD ?Location:   [facility] Room Number: 28 ?Place of Service:  ALF (13) ? ?PCP: Wenda Low, MD ?Patient Care Team: ?Wenda Low, MD as PCP - General (Internal Medicine) ?Bensimhon, Shaune Pascal, MD as PCP - Advanced Heart Failure (Cardiology) ? ?Extended Emergency Contact Information ?Primary Emergency Contact: Gaspar,Carol ?Address: [address] ?         [place] [address] [place] ?Mobile Phone: [phone] ?Relation: Spouse ?Secondary Emergency Contact: Georgiana Spinner ?Mobile Phone: [phone] ?Relation: Son ? ?Code Status: Full Code ?Goals of Care: Advanced Directive information ? ?  11/02/2021  ?  2:05 PM  ?Advanced Directives  ?Does Patient Have a Medical Advance Directive? Yes  ?Type of Advance Directive Healthcare Power of Attorney  ?Does patient want to make changes to medical advance directive? No - Patient declined  ?Copy of [place] in Chart? Yes - validated most recent copy scanned in chart (See row information)  ? ? ? ? ?Chief Complaint  ?Patient presents with  ? New Admit To SNF  ?  Admission to [place]   ? ? ?HPI: Patient is a 88 y.o. male seen today for admission to [facility] ? ?Patient has h/o  ?CHF Followed By Dr [name] ?2 D echo has shown EF of 55%. Is Euvolemic ?Baseline weight 130 lbs ? 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 But Now has right Catheter. Drains 2/week. It is draining less then 200 cc per drain ? ?Dementia ?Patient has been noticed to be having Memory issues for past few years  ?Has been on Aricept ?MMSE done here in Facility by Speech showed He missed out on calculations and Recall ?Also Failed Clock Drawing ?His Wife was taking care of him. But she died suddenly and now he is moving to [place] ?Family wants letter so that they can handle his affairs  ? ? ? ?Past Medical History:  ?Diagnosis Date  ? Actinic  keratoses   ? Arthritis   ? CHF (congestive heart failure) ([place])   ? Chronic bilateral pleural effusions   ? Chronic sinusitis   ? Colon polyps   ? Dyspnea 07/13/2013  ? BNP normal  ? Edema 07/13/2013  ? GERD (gastroesophageal reflux disease)   ? omeprazole 1-2 times daily  ? H/O TIA (transient ischemic attack) and stroke   ? on statins and plavix -saw Dr.Sethi in the past  ? Headache   ? History of BPH   ? History of TIA (transient ischemic attack) 07/13/2013  ? Left inguinal hernia   ? Left inguinal hernia 01/27/2019  ? Lumbar radiculopathy   ? Need for shingles vaccine   ? Optic neuritis 10/2015  ? left eye  ? Perennial allergic rhinitis   ? Polymyalgia rheumatica ([place]) 07/13/2013  ? Daily prednisone  ? Restless leg syndrome   ? 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 Shingles   ? Temporal arteritis ([place]) 10/2015  ? Wears glasses   ? 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  ? ?Past Surgical History:  ?Procedure Laterality Date  ? BRAVO PH STUDY N/A 04/18/2016  ? Procedure: BRAVO [place];  Surgeon: Wilford Corner, MD;  Location: [facility];  Service: Endoscopy;  Laterality: N/A;  ? CHEST TUBE INSERTION Right 02/13/2018  ? Procedure: INSERTION PLEURAL DRAINAGE CATHETER;  Surgeon: Ivin Poot, MD;  Location: [facility];  Service: Thoracic;  Laterality:  Right;  ? CHEST TUBE INSERTION Left 11/27/2019  ? Procedure: REDO INSERTION PLEURAL DRAINAGE CATHETER USING 15.5 FR. [place] CATH;  Surgeon: [name], Collier Salina, MD;  Location: [facility];  Service: Thoracic;  Laterality: Left;  ? COLONOSCOPY    ? drropy eyelid surgery    ? ESOPHAGOGASTRODUODENOSCOPY (EGD) WITH PROPOFOL N/A 04/18/2016  ? Procedure: ESOPHAGOGASTRODUODENOSCOPY (EGD) WITH PROPOFOL;  Surgeon: Wilford Corner, MD;  Location: [facility];  Service: Endoscopy;  Laterality: N/A;  ? INGUINAL HERNIA REPAIR Left 01/27/2019  ? Procedure: OPEN REPAIR LEFT INGUINAL HERNIA WITH MESH;  Surgeon: Fanny Skates, MD;  Location: [facility];  Service: General;  Laterality: Left;  GENERAL AND  TAP BLOCK ANESTHESIA  ? INSERTION OF MESH Left 01/27/2019  ? Procedure: Insertion Of Mesh;  Surgeon: Fanny Skates, MD;  Location: [facility];  Service: General;  Laterality: Left;  ? IR THORACENTESIS ASP PLEURAL SPACE W/IMG GUIDE  02/04/2018  ? IR THORACENTESIS ASP PLEURAL SPACE W/IMG GUIDE  02/06/2018  ? REMOVAL OF PLEURAL DRAINAGE CATHETER Left 11/27/2019  ? Procedure: REMOVAL OF PLEURAL DRAINAGE CATHETER;  Surgeon: Ivin Poot, MD;  Location: [facility];  Service: Thoracic;  Laterality: Left;  ? REMOVAL OF PLEURAL DRAINAGE CATHETER Left 03/31/2020  ? Procedure: REMOVAL OF PLEURAL DRAINAGE CATHETER;  Surgeon: Ivin Poot, MD;  Location: [facility];  Service: Thoracic;  Laterality: Left;  ? RIGHT/LEFT HEART CATH AND CORONARY ANGIOGRAPHY N/A 06/24/2018  ? Procedure: RIGHT/LEFT HEART CATH AND CORONARY ANGIOGRAPHY;  Surgeon: Jolaine Artist, MD;  Location: [facility];  Service: Cardiovascular;  Laterality: N/A;  ? VASECTOMY    ? ? reports that he quit smoking about 46 years ago. His smoking use included cigarettes. He has a 15.00 pack-year smoking history. He has never used smokeless tobacco. He reports current alcohol use of about 7.0 standard drinks per week. He reports that he does not use drugs. ?Social History  ? ?Socioeconomic History  ? Marital status: Married  ?  Spouse name: Not on file  ? Number of children: 2  ? Years of education: Not on file  ? Highest education level: Not on file  ?Occupational History  ? Occupation: retired  ?Tobacco Use  ? Smoking status: Former  ?  Packs/day: 1.00  ?  Years: 15.00  ?  Pack years: 15.00  ?  Types: Cigarettes  ?  Quit date: 07/23/1975  ?  Years since quitting: 46.3  ? Smokeless tobacco: Never  ?Vaping Use  ? Vaping Use: Never used  ?Substance and Sexual Activity  ? Alcohol use: Yes  ?  Alcohol/week: 7.0 standard drinks  ?  Types: 7 Glasses of wine per week  ?  Comment: 1 glass red wine daily  ? Drug use: No  ? Sexual activity: Not on file  ?Other Topics Concern  ? Not  on file  ?Social History Narrative  ? Not on file  ? ?Social Determinants of Health  ? ?Financial Resource Strain: Not on file  ?Food Insecurity: Not on file  ?Transportation Needs: Not on file  ?Physical Activity: Not on file  ?Stress: Not on file  ?Social Connections: Not on file  ?Intimate Partner Violence: Not on file  ? ? ?Functional Status Survey: ?  ? ?Family History  ?Problem Relation Age of Onset  ? Stroke Mother   ? Coronary artery disease Mother   ? Diabetes Mother   ? Breast cancer Mother   ? Heart attack Father   ? Coronary artery disease Father   ?  Diabetes Father   ? Graves' disease Sister   ? ? ?Health Maintenance  ?Topic Date Due  ? Zoster Vaccines- Shingrix (2 of 2) 04/15/2019  ? COVID-19 Vaccine (4 - Booster for Moderna series) 08/01/2020  ? INFLUENZA VACCINE  02/20/2022  ? TETANUS/TDAP  08/07/2028  ? Pneumonia Vaccine 62+ Years old  Completed  ? HPV VACCINES  Aged Out  ? ? ?No Known Allergies ? ?Allergies as of 11/02/2021   ?No Known Allergies ?  ? ?  ?Medication List  ?  ? ?  ? Accurate as of November 02, 2021  2:06 PM. If you have any questions, ask your nurse or doctor.  ?  ?  ? ?  ? ?STOP taking these medications   ? ?FISH OIL PO ?Stopped by: Virgie Dad, MD ?  ?fluticasone 50 MCG/ACT nasal spray ?Commonly known as: FLONASE ?Stopped by: Virgie Dad, MD ?  ?guaiFENesin 600 MG 12 hr tablet ?Commonly known as: [place] ?Stopped by: Virgie Dad, MD ?  ?vitamin C 250 MG tablet ?Commonly known as: ASCORBIC ACID ?Stopped by: Virgie Dad, MD ?  ? ?  ? ?TAKE these medications   ? ?acetaminophen 500 MG tablet ?Commonly known as: TYLENOL ?Take 500 mg by mouth daily as needed for moderate pain. ?  ?allopurinol 300 MG tablet ?Commonly known as: Zyloprim ?Take 0.5 tablets (150 mg total) by mouth daily. ?  ?aspirin EC 81 MG tablet ?Take 81 mg by mouth daily. ?  ?calcium carbonate 500 MG chewable tablet ?Commonly known as: TUMS - dosed in mg elemental calcium ?Chew 1 tablet by mouth daily as needed  for indigestion. ?  ?cetirizine 10 MG tablet ?Commonly known as: ZYRTEC ?Take 10 mg by mouth daily as needed for allergies. ?  ?cholecalciferol 25 MCG (1000 UNIT) tablet ?Commonly known as: VITAMIN D3 ?Take

## 2021-11-03 DIAGNOSIS — R4789 Other speech disturbances: Secondary | ICD-10-CM | POA: Diagnosis not present

## 2021-11-03 DIAGNOSIS — R278 Other lack of coordination: Secondary | ICD-10-CM | POA: Diagnosis not present

## 2021-11-03 DIAGNOSIS — L605 Yellow nail syndrome: Secondary | ICD-10-CM | POA: Diagnosis not present

## 2021-11-03 DIAGNOSIS — F0280 Dementia in other diseases classified elsewhere without behavioral disturbance: Secondary | ICD-10-CM | POA: Diagnosis not present

## 2021-11-06 ENCOUNTER — Telehealth: Payer: Medicare Other | Admitting: Cardiothoracic Surgery

## 2021-11-06 ENCOUNTER — Other Ambulatory Visit: Payer: Self-pay | Admitting: Internal Medicine

## 2021-11-06 DIAGNOSIS — E039 Hypothyroidism, unspecified: Secondary | ICD-10-CM | POA: Diagnosis not present

## 2021-11-06 DIAGNOSIS — F0280 Dementia in other diseases classified elsewhere without behavioral disturbance: Secondary | ICD-10-CM | POA: Diagnosis not present

## 2021-11-06 DIAGNOSIS — D649 Anemia, unspecified: Secondary | ICD-10-CM | POA: Diagnosis not present

## 2021-11-06 DIAGNOSIS — R278 Other lack of coordination: Secondary | ICD-10-CM | POA: Diagnosis not present

## 2021-11-06 DIAGNOSIS — R4789 Other speech disturbances: Secondary | ICD-10-CM | POA: Diagnosis not present

## 2021-11-06 DIAGNOSIS — M109 Gout, unspecified: Secondary | ICD-10-CM | POA: Diagnosis not present

## 2021-11-06 DIAGNOSIS — L605 Yellow nail syndrome: Secondary | ICD-10-CM | POA: Diagnosis not present

## 2021-11-06 DIAGNOSIS — Z20822 Contact with and (suspected) exposure to covid-19: Secondary | ICD-10-CM | POA: Diagnosis not present

## 2021-11-06 LAB — BASIC METABOLIC PANEL
BUN: 33 — AB (ref 4–21)
CO2: 31 — AB (ref 13–22)
Chloride: 106 (ref 99–108)
Creatinine: 1.2 (ref 0.6–1.3)
Glucose: 101
Potassium: 3.9 mEq/L (ref 3.5–5.1)
Sodium: 143 (ref 137–147)

## 2021-11-06 LAB — HEPATIC FUNCTION PANEL
ALT: 5 U/L — AB (ref 10–40)
AST: 11 — AB (ref 14–40)
Alkaline Phosphatase: 48 (ref 25–125)
Bilirubin, Total: 0.5

## 2021-11-06 LAB — COMPREHENSIVE METABOLIC PANEL
Albumin: 3.3 — AB (ref 3.5–5.0)
Calcium: 8.2 — AB (ref 8.7–10.7)
Globulin: 2.3
eGFR: 58

## 2021-11-06 LAB — CBC AND DIFFERENTIAL
HCT: 39 — AB (ref 41–53)
Hemoglobin: 12.7 — AB (ref 13.5–17.5)
Platelets: 308 10*3/uL (ref 150–400)
WBC: 7.1

## 2021-11-06 LAB — TSH: TSH: 3.07 (ref 0.41–5.90)

## 2021-11-06 LAB — CBC: RBC: 3.96 (ref 3.87–5.11)

## 2021-11-07 ENCOUNTER — Encounter: Payer: Self-pay | Admitting: Nurse Practitioner

## 2021-11-07 DIAGNOSIS — R278 Other lack of coordination: Secondary | ICD-10-CM | POA: Diagnosis not present

## 2021-11-07 DIAGNOSIS — R4789 Other speech disturbances: Secondary | ICD-10-CM | POA: Diagnosis not present

## 2021-11-07 DIAGNOSIS — F0280 Dementia in other diseases classified elsewhere without behavioral disturbance: Secondary | ICD-10-CM | POA: Diagnosis not present

## 2021-11-07 DIAGNOSIS — M109 Gout, unspecified: Secondary | ICD-10-CM | POA: Insufficient documentation

## 2021-11-07 DIAGNOSIS — L605 Yellow nail syndrome: Secondary | ICD-10-CM | POA: Diagnosis not present

## 2021-11-08 ENCOUNTER — Other Ambulatory Visit: Payer: Self-pay | Admitting: *Deleted

## 2021-11-08 ENCOUNTER — Telehealth: Payer: Medicare Other | Admitting: Cardiothoracic Surgery

## 2021-11-08 ENCOUNTER — Telehealth (INDEPENDENT_AMBULATORY_CARE_PROVIDER_SITE_OTHER): Payer: Medicare Other | Admitting: Cardiothoracic Surgery

## 2021-11-08 DIAGNOSIS — J90 Pleural effusion, not elsewhere classified: Secondary | ICD-10-CM | POA: Diagnosis not present

## 2021-11-08 NOTE — Progress Notes (Signed)
Patient's son contacted the office requesting an order be sent to [facility] with prescription to change pluerx catheter drainage frequency. Order placed and faxed to number provided by patient's son, [phone].  ?

## 2021-11-08 NOTE — Telephone Encounter (Signed)
? ?   ?[address] ?      [place] [address] ?            [phone]   ? ? ?CARDIOTHORACIC SURGERY TELEPHONE VIRTUAL OFFICE NOTE ? ?Referring Provider is No ref. provider found ?Primary Cardiologist is None ?PCP is Virgie Dad, MD ? ? ?HPI: ? ?I spoke with Stuart Watson (DOB 1932-07-12 ) via telephone on 11/08/2021 at 2:38 PM and verified that I was speaking with the correct person using more than one form of identification.  We discussed the reason(s) for conducting our visit virtually instead of in-person.  The patient expressed understanding the circumstances and agreed to proceed as described. ? ?I last examined the patient in January 2023 for his right Pleurx catheter.  Chest x-ray at that time showed catheter in good position with minimal right pleural effusion. ?Since that time the catheter output has decreased significantly to approximately 175-200 cc twice a week. ?A small scab is formed at the exit site with minimal redness.  The person who changes the dressing will add a small application of Neosporin at the site of the scab before putting dressing on. ? ?The patient denies any increase shortness of breath cough or discomfort associated with a reduction in the amount of fluid output. ? ?A small amount of blood was noted in the end of the tubing recently.  The patient will stop taking his fish oil tablets until this clears up. ?Current Outpatient Medications  ?Medication Sig Dispense Refill  ? acetaminophen (TYLENOL) 500 MG tablet Take 500 mg by mouth daily as needed for moderate pain.     ? allopurinol (ZYLOPRIM) 300 MG tablet TAKE ONE-HALF TABLET BY MOUTH  DAILY 45 tablet 3  ? aspirin EC 81 MG tablet Take 81 mg by mouth daily.    ? calcium carbonate (TUMS - DOSED IN MG ELEMENTAL CALCIUM) 500 MG chewable tablet Chew 1 tablet by mouth daily as needed for indigestion.    ? cetirizine (ZYRTEC) 10 MG tablet Take 10 mg by mouth daily as needed for allergies.     ? cholecalciferol  (VITAMIN D3) 25 MCG (1000 UNIT) tablet Take 1,000 Units by mouth daily.    ? donepezil (ARICEPT ODT) 10 MG disintegrating tablet Take 10 mg by mouth at bedtime.    ? doxazosin (CARDURA) 8 MG tablet Take 8 mg by mouth daily.    ? famotidine (PEPCID) 20 MG tablet Take 20 mg by mouth daily as needed for heartburn or indigestion.    ? levothyroxine (SYNTHROID) 25 MCG tablet Take 25 mcg by mouth daily.    ? metolazone (ZAROXOLYN) 2.5 MG tablet Take 1 tablet (2.5 mg total) by mouth every Wednesday. 20 tablet 3  ? pantoprazole (PROTONIX) 20 MG tablet Take 20 mg by mouth daily.     ? Polyethyl Glycol-Propyl Glycol (SYSTANE OP) Place 1 drop into both eyes 2 (two) times daily as needed (dry eyes).    ? potassium chloride SA (KLOR-CON M) 20 MEQ tablet Take 3 tablets (60 mEq total) by mouth daily. Take extra 2 tabs when you take Metolazone 570 tablet 3  ? simvastatin (ZOCOR) 20 MG tablet Take 20 mg by mouth daily.    ? spironolactone (ALDACTONE) 25 MG tablet Take 0.5 tablets (12.5 mg total) by mouth daily. 45 tablet 3  ? torsemide (DEMADEX) 20 MG tablet TAKE 3 TABLETS BY MOUTH IN  THE MORNING AND 2 TABLETS  IN THE EVENING 450 tablet 3  ? ?  No current facility-administered medications for this visit.  ? ? ? ?Diagnostic Tests: ? ?n/a ? ? ?Impression: ? 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  Fluid output has been significantly decreased over the past 2 months and we will reduce the drainage schedule to once a week. ? ? ?Plan: ? ? ?Change the dressing site and drain the catheter once a week.  Apply Neosporin to the exit site with sterile technique. ?Call if there is any problems with the catheter site or respiratory symptoms associated with decreasing the output. ?If output drops less than 100 cc/week for at least 2 to 3 weeks we may consider removing the catheter. ? ? ? ?I discussed limitations of evaluation and management via telephone.  The patient was advised to call back for repeat telephone  consultation or to seek an in-person evaluation if questions arise or the patient's clinical condition changes in any significant manner. ? ?I spent in excess of 7 minutes of non-face-to-face time during the conduct of this telephone virtual office consultation. ? ?Level 1  (99441)             5-10 minutes ?Level 2  (99442)            11-20 minutes ?Level 3  (99443)            21-30 minutes ? ? ? ?11/08/2021 ?2:38 PM ? ? ?  ?

## 2021-11-09 DIAGNOSIS — R278 Other lack of coordination: Secondary | ICD-10-CM | POA: Diagnosis not present

## 2021-11-09 DIAGNOSIS — F0280 Dementia in other diseases classified elsewhere without behavioral disturbance: Secondary | ICD-10-CM | POA: Diagnosis not present

## 2021-11-09 DIAGNOSIS — R4789 Other speech disturbances: Secondary | ICD-10-CM | POA: Diagnosis not present

## 2021-11-09 DIAGNOSIS — L605 Yellow nail syndrome: Secondary | ICD-10-CM | POA: Diagnosis not present

## 2021-11-10 DIAGNOSIS — R278 Other lack of coordination: Secondary | ICD-10-CM | POA: Diagnosis not present

## 2021-11-10 DIAGNOSIS — F0280 Dementia in other diseases classified elsewhere without behavioral disturbance: Secondary | ICD-10-CM | POA: Diagnosis not present

## 2021-11-10 DIAGNOSIS — L605 Yellow nail syndrome: Secondary | ICD-10-CM | POA: Diagnosis not present

## 2021-11-10 DIAGNOSIS — R4789 Other speech disturbances: Secondary | ICD-10-CM | POA: Diagnosis not present

## 2021-11-13 ENCOUNTER — Telehealth: Payer: Medicare Other | Admitting: Cardiothoracic Surgery

## 2021-11-13 DIAGNOSIS — R278 Other lack of coordination: Secondary | ICD-10-CM | POA: Diagnosis not present

## 2021-11-13 DIAGNOSIS — L605 Yellow nail syndrome: Secondary | ICD-10-CM | POA: Diagnosis not present

## 2021-11-13 DIAGNOSIS — Z20828 Contact with and (suspected) exposure to other viral communicable diseases: Secondary | ICD-10-CM | POA: Diagnosis not present

## 2021-11-13 DIAGNOSIS — R4789 Other speech disturbances: Secondary | ICD-10-CM | POA: Diagnosis not present

## 2021-11-13 DIAGNOSIS — F0280 Dementia in other diseases classified elsewhere without behavioral disturbance: Secondary | ICD-10-CM | POA: Diagnosis not present

## 2021-11-14 DIAGNOSIS — L605 Yellow nail syndrome: Secondary | ICD-10-CM | POA: Diagnosis not present

## 2021-11-14 DIAGNOSIS — R278 Other lack of coordination: Secondary | ICD-10-CM | POA: Diagnosis not present

## 2021-11-14 DIAGNOSIS — F0280 Dementia in other diseases classified elsewhere without behavioral disturbance: Secondary | ICD-10-CM | POA: Diagnosis not present

## 2021-11-14 DIAGNOSIS — R4789 Other speech disturbances: Secondary | ICD-10-CM | POA: Diagnosis not present

## 2021-11-15 DIAGNOSIS — R4789 Other speech disturbances: Secondary | ICD-10-CM | POA: Diagnosis not present

## 2021-11-15 DIAGNOSIS — F0280 Dementia in other diseases classified elsewhere without behavioral disturbance: Secondary | ICD-10-CM | POA: Diagnosis not present

## 2021-11-15 DIAGNOSIS — R278 Other lack of coordination: Secondary | ICD-10-CM | POA: Diagnosis not present

## 2021-11-15 DIAGNOSIS — L605 Yellow nail syndrome: Secondary | ICD-10-CM | POA: Diagnosis not present

## 2021-11-17 DIAGNOSIS — R4789 Other speech disturbances: Secondary | ICD-10-CM | POA: Diagnosis not present

## 2021-11-17 DIAGNOSIS — L605 Yellow nail syndrome: Secondary | ICD-10-CM | POA: Diagnosis not present

## 2021-11-17 DIAGNOSIS — F0280 Dementia in other diseases classified elsewhere without behavioral disturbance: Secondary | ICD-10-CM | POA: Diagnosis not present

## 2021-11-17 DIAGNOSIS — R278 Other lack of coordination: Secondary | ICD-10-CM | POA: Diagnosis not present

## 2021-11-20 DIAGNOSIS — R278 Other lack of coordination: Secondary | ICD-10-CM | POA: Diagnosis not present

## 2021-11-20 DIAGNOSIS — F0280 Dementia in other diseases classified elsewhere without behavioral disturbance: Secondary | ICD-10-CM | POA: Diagnosis not present

## 2021-11-20 DIAGNOSIS — L605 Yellow nail syndrome: Secondary | ICD-10-CM | POA: Diagnosis not present

## 2021-11-20 DIAGNOSIS — Z20828 Contact with and (suspected) exposure to other viral communicable diseases: Secondary | ICD-10-CM | POA: Diagnosis not present

## 2021-11-20 DIAGNOSIS — R4789 Other speech disturbances: Secondary | ICD-10-CM | POA: Diagnosis not present

## 2021-11-21 DIAGNOSIS — R278 Other lack of coordination: Secondary | ICD-10-CM | POA: Diagnosis not present

## 2021-11-21 DIAGNOSIS — L605 Yellow nail syndrome: Secondary | ICD-10-CM | POA: Diagnosis not present

## 2021-11-21 DIAGNOSIS — R4789 Other speech disturbances: Secondary | ICD-10-CM | POA: Diagnosis not present

## 2021-11-21 DIAGNOSIS — F0280 Dementia in other diseases classified elsewhere without behavioral disturbance: Secondary | ICD-10-CM | POA: Diagnosis not present

## 2021-11-22 DIAGNOSIS — F0280 Dementia in other diseases classified elsewhere without behavioral disturbance: Secondary | ICD-10-CM | POA: Diagnosis not present

## 2021-11-22 DIAGNOSIS — L605 Yellow nail syndrome: Secondary | ICD-10-CM | POA: Diagnosis not present

## 2021-11-22 DIAGNOSIS — R4789 Other speech disturbances: Secondary | ICD-10-CM | POA: Diagnosis not present

## 2021-11-22 DIAGNOSIS — R278 Other lack of coordination: Secondary | ICD-10-CM | POA: Diagnosis not present

## 2021-11-23 DIAGNOSIS — L605 Yellow nail syndrome: Secondary | ICD-10-CM | POA: Diagnosis not present

## 2021-11-23 DIAGNOSIS — R4789 Other speech disturbances: Secondary | ICD-10-CM | POA: Diagnosis not present

## 2021-11-23 DIAGNOSIS — F0280 Dementia in other diseases classified elsewhere without behavioral disturbance: Secondary | ICD-10-CM | POA: Diagnosis not present

## 2021-11-23 DIAGNOSIS — R278 Other lack of coordination: Secondary | ICD-10-CM | POA: Diagnosis not present

## 2021-11-24 DIAGNOSIS — F0280 Dementia in other diseases classified elsewhere without behavioral disturbance: Secondary | ICD-10-CM | POA: Diagnosis not present

## 2021-11-24 DIAGNOSIS — R4789 Other speech disturbances: Secondary | ICD-10-CM | POA: Diagnosis not present

## 2021-11-24 DIAGNOSIS — L605 Yellow nail syndrome: Secondary | ICD-10-CM | POA: Diagnosis not present

## 2021-11-24 DIAGNOSIS — R278 Other lack of coordination: Secondary | ICD-10-CM | POA: Diagnosis not present

## 2021-11-25 DIAGNOSIS — Z20822 Contact with and (suspected) exposure to covid-19: Secondary | ICD-10-CM | POA: Diagnosis not present

## 2021-11-27 DIAGNOSIS — Z20822 Contact with and (suspected) exposure to covid-19: Secondary | ICD-10-CM | POA: Diagnosis not present

## 2021-11-27 DIAGNOSIS — J918 Pleural effusion in other conditions classified elsewhere: Secondary | ICD-10-CM | POA: Diagnosis not present

## 2021-11-27 DIAGNOSIS — R278 Other lack of coordination: Secondary | ICD-10-CM | POA: Diagnosis not present

## 2021-11-27 DIAGNOSIS — R4789 Other speech disturbances: Secondary | ICD-10-CM | POA: Diagnosis not present

## 2021-11-27 DIAGNOSIS — F0280 Dementia in other diseases classified elsewhere without behavioral disturbance: Secondary | ICD-10-CM | POA: Diagnosis not present

## 2021-11-27 DIAGNOSIS — L605 Yellow nail syndrome: Secondary | ICD-10-CM | POA: Diagnosis not present

## 2021-11-28 DIAGNOSIS — R278 Other lack of coordination: Secondary | ICD-10-CM | POA: Diagnosis not present

## 2021-11-28 DIAGNOSIS — F0280 Dementia in other diseases classified elsewhere without behavioral disturbance: Secondary | ICD-10-CM | POA: Diagnosis not present

## 2021-11-28 DIAGNOSIS — L605 Yellow nail syndrome: Secondary | ICD-10-CM | POA: Diagnosis not present

## 2021-11-28 DIAGNOSIS — R4789 Other speech disturbances: Secondary | ICD-10-CM | POA: Diagnosis not present

## 2021-11-30 DIAGNOSIS — F0280 Dementia in other diseases classified elsewhere without behavioral disturbance: Secondary | ICD-10-CM | POA: Diagnosis not present

## 2021-11-30 DIAGNOSIS — R278 Other lack of coordination: Secondary | ICD-10-CM | POA: Diagnosis not present

## 2021-11-30 DIAGNOSIS — R4789 Other speech disturbances: Secondary | ICD-10-CM | POA: Diagnosis not present

## 2021-11-30 DIAGNOSIS — L605 Yellow nail syndrome: Secondary | ICD-10-CM | POA: Diagnosis not present

## 2021-12-04 ENCOUNTER — Telehealth: Payer: Self-pay | Admitting: Cardiothoracic Surgery

## 2021-12-04 ENCOUNTER — Telehealth: Payer: Medicare Other | Admitting: Cardiothoracic Surgery

## 2021-12-04 NOTE — Telephone Encounter (Signed)
? ? ?   ?[address] ?      [place] [address] ?            [phone]   ? ? ?CARDIOTHORACIC SURGERY TELEPHONE VIRTUAL OFFICE NOTE ? ?Referring Provider is No ref. provider found ?Primary Cardiologist is None ?PCP is Virgie Dad, MD ? ? ?HPI: ? ?I spoke with Stuart Watson (DOB [birth year] ) via telephone on 12/04/2021 at 5:18 PM and verified that I was speaking with the correct person using more than one form of identification.  We discussed the reason(s) for conducting our visit virtually instead of in-person.  The patient expressed understanding the circumstances and agreed to proceed as described. ? ?I called the patient with his son about the right Pleurx catheter. ?4 weeks ago the drainage schedule was changed from twice a week to once a week.  The last 2 drainage sessions the volume removed has increased up to 750 cc.  For that reason the drainage schedule be placed back at twice a week on Monday and Thursday.  The patient and son [facility] and dressing change.  He will continue to use a small amount of Neosporin around the exit site which has a area of granulation tissue. ? ?The patient and son wish to have a in person office visit at the next follow-up so that I can examine the exit site. ? ?Patient is doing well maintaining weight no shortness of breath chest pain or cough. ?Current Outpatient Medications  ?Medication Sig Dispense Refill  ? acetaminophen (TYLENOL) 500 MG tablet Take 500 mg by mouth daily as needed for moderate pain.     ? allopurinol (ZYLOPRIM) 300 MG tablet TAKE ONE-HALF TABLET BY MOUTH  DAILY 45 tablet 3  ? aspirin EC 81 MG tablet Take 81 mg by mouth daily.    ? calcium carbonate (TUMS - DOSED IN MG ELEMENTAL CALCIUM) 500 MG chewable tablet Chew 1 tablet by mouth daily as needed for indigestion.    ? cetirizine (ZYRTEC) 10 MG tablet Take 10 mg by mouth daily as needed for allergies.     ?  cholecalciferol (VITAMIN D3) 25 MCG (1000 UNIT) tablet Take 1,000 Units by mouth daily.    ? donepezil (ARICEPT ODT) 10 MG disintegrating tablet Take 10 mg by mouth at bedtime.    ? doxazosin (CARDURA) 8 MG tablet Take 8 mg by mouth daily.    ? famotidine (PEPCID) 20 MG tablet Take 20 mg by mouth daily as needed for heartburn or indigestion.    ? levothyroxine (SYNTHROID) 25 MCG tablet Take 25 mcg by mouth daily.    ? metolazone (ZAROXOLYN) 2.5 MG tablet Take 1 tablet (2.5 mg total) by mouth every Wednesday. 20 tablet 3  ? pantoprazole (PROTONIX) 20 MG tablet Take 20 mg by mouth daily.     ? Polyethyl Glycol-Propyl Glycol (SYSTANE OP) Place 1 drop into both eyes 2 (two) times daily as needed (dry eyes).    ? potassium chloride SA (KLOR-CON M) 20 MEQ tablet Take 3 tablets (60 mEq total) by mouth daily. Take extra 2 tabs when you take Metolazone 570 tablet 3  ? simvastatin (ZOCOR) 20 MG tablet Take 20 mg by mouth daily.    ? spironolactone (ALDACTONE) 25 MG tablet Take 0.5 tablets (12.5 mg total) by mouth daily. 45 tablet 3  ? torsemide (DEMADEX) 20 MG tablet TAKE 3 TABLETS BY MOUTH IN  THE MORNING AND 2 TABLETS  IN THE EVENING 450 tablet 3  ? ?No current facility-administered medications for this visit.  ? ? ? ?Diagnostic Tests: ? ?None n/a ? ? ?Impression: ? ?Right Pleurx catheter for chronic recurrent benign pleural effusion continues to function adequately minimizes symptoms of fullness or shortness of breath ? ?Plan: ? ?Return for in person office visit in 8 weeks to review the drainage and examine the exit site of the catheter. ? ? ? ?I discussed limitations of evaluation and management via telephone.  The patient was advised to call back for repeat telephone consultation or to seek an in-person evaluation if questions arise or the patient's clinical condition changes in any significant manner. ? ?I spent in excess of 8 minutes of non-face-to-face time during the conduct of this telephone virtual office  consultation. ? ?Level 1  (99441)             5-10 minutes ?Level 2  (99442)            11-20 minutes ?Level 3  (99443)            21-30 minutes ? ? ? ?12/04/2021 ?5:18 PM ? ? ?  ? ?

## 2021-12-05 ENCOUNTER — Other Ambulatory Visit: Payer: Self-pay | Admitting: *Deleted

## 2021-12-05 DIAGNOSIS — R4789 Other speech disturbances: Secondary | ICD-10-CM | POA: Diagnosis not present

## 2021-12-05 DIAGNOSIS — J90 Pleural effusion, not elsewhere classified: Secondary | ICD-10-CM

## 2021-12-05 DIAGNOSIS — R278 Other lack of coordination: Secondary | ICD-10-CM | POA: Diagnosis not present

## 2021-12-05 DIAGNOSIS — L605 Yellow nail syndrome: Secondary | ICD-10-CM | POA: Diagnosis not present

## 2021-12-05 DIAGNOSIS — F0280 Dementia in other diseases classified elsewhere without behavioral disturbance: Secondary | ICD-10-CM | POA: Diagnosis not present

## 2021-12-05 NOTE — Progress Notes (Signed)
Order faxed to [facility] per Dr. [name] for Pleurx to be drained every Monday and Thursday with dressing change and Neosporin application to catheter exit site.  ?

## 2021-12-07 DIAGNOSIS — L605 Yellow nail syndrome: Secondary | ICD-10-CM | POA: Diagnosis not present

## 2021-12-07 DIAGNOSIS — R278 Other lack of coordination: Secondary | ICD-10-CM | POA: Diagnosis not present

## 2021-12-07 DIAGNOSIS — F0280 Dementia in other diseases classified elsewhere without behavioral disturbance: Secondary | ICD-10-CM | POA: Diagnosis not present

## 2021-12-07 DIAGNOSIS — R4789 Other speech disturbances: Secondary | ICD-10-CM | POA: Diagnosis not present

## 2021-12-28 ENCOUNTER — Non-Acute Institutional Stay: Payer: Medicare Other | Admitting: Internal Medicine

## 2021-12-28 ENCOUNTER — Encounter: Payer: Self-pay | Admitting: Internal Medicine

## 2021-12-28 DIAGNOSIS — Z9689 Presence of other specified functional implants: Secondary | ICD-10-CM | POA: Diagnosis not present

## 2021-12-28 DIAGNOSIS — N400 Enlarged prostate without lower urinary tract symptoms: Secondary | ICD-10-CM | POA: Diagnosis not present

## 2021-12-28 DIAGNOSIS — E039 Hypothyroidism, unspecified: Secondary | ICD-10-CM | POA: Diagnosis not present

## 2021-12-28 DIAGNOSIS — L605 Yellow nail syndrome: Secondary | ICD-10-CM

## 2021-12-28 DIAGNOSIS — I5032 Chronic diastolic (congestive) heart failure: Secondary | ICD-10-CM | POA: Diagnosis not present

## 2021-12-28 DIAGNOSIS — M1A9XX0 Chronic gout, unspecified, without tophus (tophi): Secondary | ICD-10-CM

## 2021-12-28 DIAGNOSIS — F4321 Adjustment disorder with depressed mood: Secondary | ICD-10-CM

## 2021-12-28 DIAGNOSIS — F03B0 Unspecified dementia, moderate, without behavioral disturbance, psychotic disturbance, mood disturbance, and anxiety: Secondary | ICD-10-CM

## 2021-12-28 NOTE — Progress Notes (Signed)
Location:   [facility] Room Number: 28 Place of Service:  ALF [phone]) Provider:  Veleta Miners MD  Virgie Dad, MD  Patient Care Team: Virgie Dad, MD as PCP - General (Internal Medicine) Bensimhon, Shaune Pascal, MD as PCP - Advanced Heart Failure (Cardiology)  Extended Emergency Contact Information Primary Emergency Contact: Bignell,Carol Address: [place] [address] [address] [place] Phone: [phone] Relation: Spouse Secondary Emergency Contact: Marquet, [place] Mobile Phone: [phone] Relation: Son  Code Status:  DNR Goals of care: Advanced Directive information    11/02/2021    2:05 PM  Advanced Directives  Does Patient Have a Medical Advance Directive? Yes  Type of Advance Directive [place]  Does patient want to make changes to medical advance directive? No - Patient declined  Copy of [place] in Chart? Yes - validated most recent copy scanned in chart (See row information)     Chief Complaint  Patient presents with   Acute Visit    Depression     HPI:  Pt is a 88 y.o. male seen today for an acute visit for Evaluation of Depression  Patient is recent move to [place] after sudden Death of his wife His sons have noticed that he gets upset with them very easily They wanted ot make sure he is not depressed  Per Nurses he is doing well Wt Readings from Last 3 Encounters:  12/28/21 131 lb 12.8 oz (59.8 kg)  11/02/21 131 lb 6.4 oz (59.6 kg)  09/14/21 135 lb 3.2 oz (61.3 kg)   Walks all the time Sleeping well. No behavior issues with them He told me he is misses his wife but denied depression  Other issues Patient has h/o  CHF Followed By Dr [name] 2 D echo has shown EF of 55%. Is Euvolemic Baseline weight 130 lbs   Recurent Pleural Effusion 2017 Follows with Dr Lucianne Lei Tright Suspected Yellow Nail Syndrome S/p Bilateral Pleurex Tubes. But Now has  right Catheter. Drains 2/week. It is draining less then 200 cc per drain   Dementia Patient has been noticed to be having Memory issues for past few years  Has been on Aricept MMSE done here in Facility by Speech showed He missed out on calculations and Recall Also Failed Clock Drawing  Past Medical History:  Diagnosis Date   Actinic keratoses    Arthritis    CHF (congestive heart failure) ([place])    Chronic bilateral pleural effusions    Chronic sinusitis    Colon polyps    Dyspnea 07/13/2013   BNP normal   Edema 07/13/2013   GERD (gastroesophageal reflux disease)    omeprazole 1-2 times daily   H/O TIA (transient ischemic attack) and stroke    on statins and plavix -saw Dr.Sethi in the past   Headache    History of BPH    History of TIA (transient ischemic attack) 07/13/2013   Left inguinal hernia    Left inguinal hernia 01/27/2019   Lumbar radiculopathy    Need for shingles vaccine    Optic neuritis 10/2015   left eye   Perennial allergic rhinitis    Polymyalgia rheumatica ([place]) 07/13/2013   Daily prednisone   Restless leg syndrome    Retinal disease    right eye since birth    Shingles    Temporal arteritis ([place]) 10/2015   Wears glasses  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   Past Surgical History:  Procedure Laterality Date   BRAVO [place] STUDY N/A 04/18/2016   Procedure: BRAVO [place];  Surgeon: Wilford Corner, MD;  Location: [facility];  Service: Endoscopy;  Laterality: N/A;   CHEST TUBE INSERTION Right 02/13/2018   Procedure: INSERTION PLEURAL DRAINAGE CATHETER;  Surgeon: Ivin Poot, MD;  Location: [facility];  Service: Thoracic;  Laterality: Right;   CHEST TUBE INSERTION Left 11/27/2019   Procedure: REDO INSERTION PLEURAL DRAINAGE CATHETER USING 15.5 FR. [place] CATH;  Surgeon: [name], Collier Salina, MD;  Location: [facility];  Service: Thoracic;  Laterality: Left;   COLONOSCOPY     drropy eyelid surgery     ESOPHAGOGASTRODUODENOSCOPY (EGD) WITH PROPOFOL N/A 04/18/2016    Procedure: ESOPHAGOGASTRODUODENOSCOPY (EGD) WITH PROPOFOL;  Surgeon: Wilford Corner, MD;  [facility]: [facility];  Service: Endoscopy;  Laterality: N/A;   INGUINAL HERNIA REPAIR Left 01/27/2019   Procedure: OPEN REPAIR LEFT INGUINAL HERNIA WITH MESH;  Surgeon: Fanny Skates, MD;  Location: [facility];  Service: General;  Laterality: Left;  GENERAL AND TAP BLOCK ANESTHESIA   INSERTION OF MESH Left 01/27/2019   Procedure: Insertion Of Mesh;  Surgeon: Fanny Skates, MD;  Location: [facility];  Service: General;  Laterality: Left;   IR THORACENTESIS ASP PLEURAL SPACE W/IMG GUIDE  02/04/2018   IR THORACENTESIS ASP PLEURAL SPACE W/IMG GUIDE  02/06/2018   REMOVAL OF PLEURAL DRAINAGE CATHETER Left 11/27/2019   Procedure: REMOVAL OF PLEURAL DRAINAGE CATHETER;  Surgeon: Ivin Poot, MD;  Location: [facility];  Service: Thoracic;  Laterality: Left;   REMOVAL OF PLEURAL DRAINAGE CATHETER Left 03/31/2020   Procedure: REMOVAL OF PLEURAL DRAINAGE CATHETER;  Surgeon: Ivin Poot, MD;  Location: [facility];  Service: Thoracic;  Laterality: Left;   RIGHT/LEFT HEART CATH AND CORONARY ANGIOGRAPHY N/A 06/24/2018   Procedure: RIGHT/LEFT HEART CATH AND CORONARY ANGIOGRAPHY;  Surgeon: Jolaine Artist, MD;  Location: [facility];  Service: Cardiovascular;  Laterality: N/A;   VASECTOMY      No Known Allergies  Allergies as of 12/28/2021   No Known Allergies      Medication List        Accurate as of December 28, 2021  2:48 PM. If you have any questions, ask your nurse or doctor.          STOP taking these medications    acetaminophen 500 MG tablet Commonly known as: TYLENOL Stopped by: Virgie Dad, MD   calcium carbonate 500 MG chewable tablet Commonly known as: TUMS - dosed in mg elemental calcium Stopped by: Virgie Dad, MD   cetirizine 10 MG tablet Commonly known as: ZYRTEC Stopped by: Virgie Dad, MD   cholecalciferol 25 MCG (1000 UNIT) tablet Commonly known as: VITAMIN D3 Stopped by: Virgie Dad, MD       TAKE these medications    allopurinol 300 MG tablet Commonly known as: ZYLOPRIM TAKE ONE-HALF TABLET BY MOUTH  DAILY   aspirin EC 81 MG tablet Take 81 mg by mouth daily.   donepezil 10 MG disintegrating tablet Commonly known as: ARICEPT ODT Take 10 mg by mouth at bedtime.   doxazosin 8 MG tablet Commonly known as: CARDURA Take 8 mg by mouth daily.   famotidine 20 MG tablet Commonly known as: PEPCID Take 20 mg by mouth daily as needed for heartburn or indigestion.   levothyroxine 25 MCG tablet Commonly known as: SYNTHROID Take 25 mcg by mouth daily.  metolazone 2.5 MG tablet Commonly known as: ZAROXOLYN Take 1 tablet (2.5 mg total) by mouth every Wednesday.   pantoprazole 20 MG tablet Commonly known as: PROTONIX Take 20 mg by mouth daily.   potassium chloride SA 20 MEQ tablet Commonly known as: KLOR-CON M Take 20 mEq by mouth daily. What changed: Another medication with the same name was removed. Continue taking this medication, and follow the directions you see here. Changed by: Virgie Dad, MD   potassium chloride SA 20 MEQ tablet Commonly known as: KLOR-CON M Take 40 mEq by mouth every morning. What changed: Another medication with the same name was removed. Continue taking this medication, and follow the directions you see here. Changed by: Virgie Dad, MD   simvastatin 20 MG tablet Commonly known as: ZOCOR Take 20 mg by mouth daily.   spironolactone 25 MG tablet Commonly known as: ALDACTONE Take 0.5 tablets (12.5 mg total) by mouth daily.   SYSTANE OP Place 1 drop into both eyes 2 (two) times daily as needed (dry eyes).   torsemide 20 MG tablet Commonly known as: DEMADEX Take 60 mg by mouth 2 (two) times daily. What changed: Another medication with the same name was removed. Continue taking this medication, and follow the directions you see here. Changed by: Virgie Dad, MD        Review of Systems   Constitutional:  Negative for activity change, appetite change and unexpected weight change.  HENT: Negative.    Respiratory:  Negative for cough and shortness of breath.   Cardiovascular:  Negative for leg swelling.  Gastrointestinal:  Negative for constipation.  Genitourinary:  Negative for frequency.  Musculoskeletal:  Negative for arthralgias, gait problem and myalgias.  Skin: Negative.  Negative for rash.  Neurological:  Negative for dizziness and weakness.  Psychiatric/Behavioral:  Positive for confusion. Negative for sleep disturbance.   All other systems reviewed and are negative.   Immunization History  Administered Date(s) Administered   Influenza, High Dose Seasonal PF 04/15/2017, 05/16/2018   Influenza,inj,Quad PF,6+ Mos 04/23/2015   Moderna Sars-Covid-2 Vaccination 07/26/2019, 08/26/2019, 06/06/2020   Pneumococcal Conjugate-13 02/14/2016, 07/23/2016   Pneumococcal Polysaccharide-23 11/13/2012   Td 01/29/2006   Tdap 08/07/2018   Zoster Recombinat (Shingrix) 02/18/2019   Zoster, Live 02/18/2019, 07/01/2019   Pertinent  Health Maintenance Due  Topic Date Due   INFLUENZA VACCINE  02/20/2022      06/24/2018   10:54 AM 08/07/2018   12:05 PM 08/12/2018    3:27 PM 01/27/2019    7:08 AM 01/27/2019   10:15 AM  Fall Risk  Falls in the past year?   1    Was there an injury with Fall?   1    Fall Risk Category Calculator   2    Fall Risk Category   Moderate    Patient Fall Risk Level Moderate fall risk Low fall risk Moderate fall risk Moderate fall risk High fall risk  Patient at Risk for Falls Due to   Impaired balance/gait    Fall risk Follow up   Falls prevention discussed     Functional Status Survey:    Vitals:   12/28/21 1151  BP: 125/62  Pulse: 74  Resp: 20  Temp: 97.8 F (36.6 C)  SpO2: 96%  Weight: 131 lb 12.8 oz (59.8 kg)  Height: '5\' 6"'$  (1.676 m)   Body mass index is 21.27 kg/m. Physical Exam Vitals reviewed.  Constitutional:      Appearance:  Normal appearance.  HENT:     Head: Normocephalic.     Nose: Nose normal.     Mouth/Throat:     Mouth: Mucous membranes are moist.     Pharynx: Oropharynx is clear.  Eyes:     Pupils: Pupils are equal, round, and reactive to light.  Cardiovascular:     Rate and Rhythm: Normal rate and regular rhythm.     Pulses: Normal pulses.     Heart sounds: Murmur heard.  Pulmonary:     Effort: Pulmonary effort is normal. No respiratory distress.     Breath sounds: Normal breath sounds. No rales.  Abdominal:     General: Abdomen is flat. Bowel sounds are normal.     Palpations: Abdomen is soft.  Musculoskeletal:        General: Swelling present.     Cervical back: Neck supple.  Skin:    General: Skin is warm.  Neurological:     General: No focal deficit present.     Mental Status: He is alert and oriented to person, place, and time.  Psychiatric:        Mood and Affect: Mood normal.        Thought Content: Thought content normal.     Labs reviewed: Recent Labs    02/22/21 1518 03/13/21 1631 09/14/21 1411  NA 139 138 138  K 3.8 4.5 3.5  CL 102 99 99  CO2 '28 31 29  '$ GLUCOSE 93 96 118*  BUN 32* 34* 28*  CREATININE 1.14 1.26* 1.50*  CALCIUM 8.3* 8.6* 8.4*  MG  --  3.0*  --    Recent Labs    02/01/21 1504  AST 17  ALT 8  ALKPHOS 40  BILITOT 0.6  PROT 5.9*  ALBUMIN 2.9*   No results for input(s): "WBC", "NEUTROABS", "HGB", "HCT", "MCV", "PLT" in the last 8760 hours. Lab Results  Component Value Date   TSH 5.91 (H) 01/20/2018   No results found for: "HGBA1C" No results found for: "CHOL", "HDL", "LDLCALC", "LDLDIRECT", "TRIG", "CHOLHDL"  Significant Diagnostic Results in last 30 days:  No results found.  Assessment/Plan 1. Grieving I do not think patient is depressed He is Grieving los sof his wife Will wait right now before starting antidepressant  2. Moderate dementia without behavioral disturbance, psychotic disturbance, mood disturbance, or anxiety,  unspecified dementia type (HCC) Adjusted to [place] On Aricept  MMSE 23/30 Failed Clock Drawing CT scan in 07/22 showed Chronic Vessel changes Appropriate for AL. Will start Namenda if ok With POA 3. Chest tube in place Follows with Dr Lucianne Lei tright  4. Yellow nail syndrome   5. Chronic gout without tophus, unspecified cause, unspecified site Allopurinol  6. Acquired hypothyroidism TSH normal in facility  7. Chronic diastolic CHF (congestive heart failure) (HCC) High doses of torsemide and Metolazone 8 CKD stage 3 a     Family/ staff Communication:   Labs/tests ordered:

## 2022-01-03 ENCOUNTER — Encounter: Payer: Self-pay | Admitting: *Deleted

## 2022-01-03 DIAGNOSIS — J90 Pleural effusion, not elsewhere classified: Secondary | ICD-10-CM

## 2022-01-04 DIAGNOSIS — J918 Pleural effusion in other conditions classified elsewhere: Secondary | ICD-10-CM | POA: Diagnosis not present

## 2022-01-08 ENCOUNTER — Ambulatory Visit (INDEPENDENT_AMBULATORY_CARE_PROVIDER_SITE_OTHER): Payer: Medicare Other | Admitting: Cardiothoracic Surgery

## 2022-01-08 ENCOUNTER — Encounter: Payer: Self-pay | Admitting: Cardiothoracic Surgery

## 2022-01-08 VITALS — BP 124/63 | HR 70 | Resp 20 | Ht 66.0 in | Wt 135.0 lb

## 2022-01-08 DIAGNOSIS — J90 Pleural effusion, not elsewhere classified: Secondary | ICD-10-CM

## 2022-01-08 DIAGNOSIS — Z9689 Presence of other specified functional implants: Secondary | ICD-10-CM

## 2022-01-08 DIAGNOSIS — Z9889 Other specified postprocedural states: Secondary | ICD-10-CM | POA: Insufficient documentation

## 2022-01-08 DIAGNOSIS — Z938 Other artificial opening status: Secondary | ICD-10-CM | POA: Diagnosis not present

## 2022-01-08 NOTE — Progress Notes (Signed)
HPI: The patient returns for right Pleurx catheter check.  The current drainage schedule [facility]  The volume removed for drainage is approximately 350-450 cc.  The catheter was drained in the clinic today of a serosanguineous clear fluid.  The wound is clean without cellulitis or drainage.  There is some exposure of the lower collar of the catheter which is worked its way through the skin over the past 2 years.  There is still considerable forward beneath the skin line for security and in-growth.  The patient denies any shortness of breath with the current drainage schedule.  Current Outpatient Medications  Medication Sig Dispense Refill   allopurinol (ZYLOPRIM) 300 MG tablet TAKE ONE-HALF TABLET BY MOUTH  DAILY 45 tablet 3   aspirin EC 81 MG tablet Take 81 mg by mouth daily.     donepezil (ARICEPT ODT) 10 MG disintegrating tablet Take 10 mg by mouth at bedtime.     doxazosin (CARDURA) 8 MG tablet Take 8 mg by mouth daily.     famotidine (PEPCID) 20 MG tablet Take 20 mg by mouth daily as needed for heartburn or indigestion.     levothyroxine (SYNTHROID) 25 MCG tablet Take 25 mcg by mouth daily.     metolazone (ZAROXOLYN) 2.5 MG tablet Take 1 tablet (2.5 mg total) by mouth every Wednesday. 20 tablet 3   pantoprazole (PROTONIX) 20 MG tablet Take 20 mg by mouth daily.      Polyethyl Glycol-Propyl Glycol (SYSTANE OP) Place 1 drop into both eyes 2 (two) times daily as needed (dry eyes).     potassium chloride SA (KLOR-CON M) 20 MEQ tablet Take 20 mEq by mouth daily.     potassium chloride SA (KLOR-CON M) 20 MEQ tablet Take 40 mEq by mouth every morning.     simvastatin (ZOCOR) 20 MG tablet Take 20 mg by mouth daily.     spironolactone (ALDACTONE) 25 MG tablet Take 0.5 tablets (12.5 mg total) by mouth daily. 45 tablet 3   torsemide (DEMADEX) 20 MG tablet Take 60 mg by mouth 2 (two) times daily.     No current facility-administered medications for this  visit.     Physical Exam: Blood pressure 124/63, pulse 70, resp. rate 20, height '5\' 6"'$  (1.676 m), weight 135 lb (61.2 kg), SpO2 96 %.   The catheter site is clean without cellulitis or drainage.  There is slight exposure of the lower collar on the inferior aspect of the catheter.  Diagnostic Tests: No chest x-ray  Impression: Continue drainage twice a week using the same dressing techniques as the catheter site looks good.  Plan: Phone call visit in 3 months to be arranged with the patient's son Orpah Greek [phone]   [name], MD [facility] [phone]

## 2022-01-15 DIAGNOSIS — E876 Hypokalemia: Secondary | ICD-10-CM | POA: Diagnosis not present

## 2022-01-15 DIAGNOSIS — E612 Magnesium deficiency: Secondary | ICD-10-CM | POA: Diagnosis not present

## 2022-01-29 ENCOUNTER — Ambulatory Visit: Payer: Medicare Other | Admitting: Cardiothoracic Surgery

## 2022-02-05 ENCOUNTER — Encounter: Payer: Self-pay | Admitting: Orthopedic Surgery

## 2022-02-05 ENCOUNTER — Non-Acute Institutional Stay: Payer: Medicare Other | Admitting: Orthopedic Surgery

## 2022-02-05 DIAGNOSIS — J90 Pleural effusion, not elsewhere classified: Secondary | ICD-10-CM | POA: Diagnosis not present

## 2022-02-05 DIAGNOSIS — Z9689 Presence of other specified functional implants: Secondary | ICD-10-CM | POA: Diagnosis not present

## 2022-02-05 DIAGNOSIS — M79642 Pain in left hand: Secondary | ICD-10-CM | POA: Diagnosis not present

## 2022-02-05 DIAGNOSIS — W19XXXA Unspecified fall, initial encounter: Secondary | ICD-10-CM | POA: Diagnosis not present

## 2022-02-05 DIAGNOSIS — J918 Pleural effusion in other conditions classified elsewhere: Secondary | ICD-10-CM | POA: Diagnosis not present

## 2022-02-05 DIAGNOSIS — F03B0 Unspecified dementia, moderate, without behavioral disturbance, psychotic disturbance, mood disturbance, and anxiety: Secondary | ICD-10-CM

## 2022-02-05 DIAGNOSIS — I5032 Chronic diastolic (congestive) heart failure: Secondary | ICD-10-CM

## 2022-02-05 DIAGNOSIS — L605 Yellow nail syndrome: Secondary | ICD-10-CM | POA: Diagnosis not present

## 2022-02-05 NOTE — Progress Notes (Signed)
Location:  [place] Room Number: [place] of Service:  ALF [phone]) Provider: Yvonna Alanis, NP  Patient Care Team: Virgie Dad, MD as PCP - General (Internal Medicine) Bensimhon, Shaune Pascal, MD as PCP - Advanced Heart Failure (Cardiology)  Extended Emergency Contact Information Primary Emergency Contact: Cott,Carol Address: [address]          [place] [address] [place] Phone: [phone] Relation: Spouse Secondary Emergency Contact: Mangiapane,[name] Mobile Phone: [phone] Relation: Son  Code Status:  DNR Goals of care: Advanced Directive information    02/05/2022   11:04 AM  Advanced Directives  Does Patient Have a Medical Advance Directive? Yes  Type of Paramedic of [place];Living will;Out of facility DNR (pink MOST or yellow form)  Does patient want to make changes to medical advance directive? No - Patient declined  Copy of [place] in Chart? Yes - validated most recent copy scanned in chart (See row information)  Pre-existing out of facility DNR order (yellow form or pink MOST form) Pink MOST form placed in chart (order not valid for inpatient use);Yellow form placed in chart (order not valid for inpatient use)     Chief Complaint  Patient presents with   Acute Visit    Left hand swelling     HPI:  Pt is a 88 y.o. male seen today for an acute visit for left hand pain.   07/15 he fell while walking hallway. After incident he c/o left hand pain. He also had abrasion to left pointer finger and wrist. Today, left hand appears swollen and bruised. He reports minimal pain and limited ROM. Nursing staff has given him ice for his hand.   Dementia- MMSE 23/30, 01/2021 CT head noted chronic vessel changes, no behaviors, doing well in AL, remains on Aricept and Namenda Pleural effusion- followed by Dr. 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, has pleurx drain, remains  on metolazone once week (wed) CHF- 03/13/2022 LVEF 55-60%, remains on torsemide and spirolactone Gout- remains on allopurinol Hypothyroidism- TSH 3.07 11/06/2021, remains on levothyroxine Grieving- admitted to [facility] after wife death earlier this year, no mood changes, good family support  Past Medical History:  Diagnosis Date   Actinic keratoses    Arthritis    CHF (congestive heart failure) ([place])    Chronic bilateral pleural effusions    Chronic sinusitis    Colon polyps    Dyspnea 07/13/2013   BNP normal   Edema 07/13/2013   GERD (gastroesophageal reflux disease)    omeprazole 1-2 times daily   H/O TIA (transient ischemic attack) and stroke    on statins and plavix -saw Dr.Sethi in the past   Headache    History of BPH    History of TIA (transient ischemic attack) 07/13/2013   Left inguinal hernia    Left inguinal hernia 01/27/2019   Lumbar radiculopathy    Need for shingles vaccine    Optic neuritis 10/2015   left eye   Perennial allergic rhinitis    Polymyalgia rheumatica ([facility]) 07/13/2013   Daily prednisone   Restless leg syndrome    Retinal disease    right eye since birth    Shingles    Temporal arteritis ([place]) 10/2015   Wears glasses    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   Past Surgical History:  Procedure Laterality Date   BRAVO [place] STUDY N/A 04/18/2016   Procedure: BRAVO [place] STUDY;  Surgeon: Wilford Corner, MD;  Location: [facility];  Service: Endoscopy;  Laterality: N/A;   CHEST TUBE INSERTION Right 02/13/2018   Procedure: INSERTION PLEURAL DRAINAGE CATHETER;  Surgeon: Ivin Poot, MD;  Location: [facility];  Service: Thoracic;  Laterality: Right;   CHEST TUBE INSERTION Left 11/27/2019   Procedure: REDO INSERTION PLEURAL DRAINAGE CATHETER USING 15.5 FR. Ginger Blue CATH;  Surgeon: [name], Collier Salina, MD;  Location: [facility];  Service: Thoracic;  Laterality: Left;   COLONOSCOPY     drropy eyelid surgery     ESOPHAGOGASTRODUODENOSCOPY (EGD) WITH PROPOFOL N/A 04/18/2016    Procedure: ESOPHAGOGASTRODUODENOSCOPY (EGD) WITH PROPOFOL;  Surgeon: Wilford Corner, MD;  Location: [facility];  Service: Endoscopy;  Laterality: N/A;   INGUINAL HERNIA REPAIR Left 01/27/2019   Procedure: OPEN REPAIR LEFT INGUINAL HERNIA WITH MESH;  Surgeon: Fanny Skates, MD;  Location: [facility];  Service: General;  Laterality: Left;  GENERAL AND TAP BLOCK ANESTHESIA   INSERTION OF MESH Left 01/27/2019   Procedure: Insertion Of Mesh;  Surgeon: Fanny Skates, MD;  Location: [facility];  Service: General;  Laterality: Left;   IR THORACENTESIS ASP PLEURAL SPACE W/IMG GUIDE  02/04/2018   IR THORACENTESIS ASP PLEURAL SPACE W/IMG GUIDE  02/06/2018   REMOVAL OF PLEURAL DRAINAGE CATHETER Left 11/27/2019   Procedure: REMOVAL OF PLEURAL DRAINAGE CATHETER;  Surgeon: Ivin Poot, MD;  Location: [facility];  Service: Thoracic;  Laterality: Left;   REMOVAL OF PLEURAL DRAINAGE CATHETER Left 03/31/2020   Procedure: REMOVAL OF PLEURAL DRAINAGE CATHETER;  Surgeon: Ivin Poot, MD;  Location: [facility];  Service: Thoracic;  Laterality: Left;   RIGHT/LEFT HEART CATH AND CORONARY ANGIOGRAPHY N/A 06/24/2018   Procedure: RIGHT/LEFT HEART CATH AND CORONARY ANGIOGRAPHY;  Surgeon: Jolaine Artist, MD;  Location: [facility];  Service: Cardiovascular;  Laterality: N/A;   VASECTOMY      No Known Allergies  Outpatient Encounter Medications as of 02/05/2022  Medication Sig   allopurinol (ZYLOPRIM) 300 MG tablet Take 150 mg by mouth in the morning.   aspirin EC 81 MG tablet Take 81 mg by mouth daily.   donepezil (ARICEPT ODT) 10 MG disintegrating tablet Take 10 mg by mouth at bedtime.   doxazosin (CARDURA) 8 MG tablet Take 8 mg by mouth daily.   famotidine (PEPCID) 20 MG tablet Take 20 mg by mouth daily as needed for heartburn or indigestion.   levothyroxine (SYNTHROID) 25 MCG tablet Take 25 mcg by mouth daily.   memantine (NAMENDA) 5 MG tablet Take 5 mg by mouth 2 (two) times daily.   metolazone (ZAROXOLYN) 2.5 MG  tablet Take 1 tablet (2.5 mg total) by mouth every Wednesday.   pantoprazole (PROTONIX) 20 MG tablet Take 20 mg by mouth daily.    potassium chloride SA (KLOR-CON M) 20 MEQ tablet Take 20 mEq by mouth daily.   potassium chloride SA (KLOR-CON M) 20 MEQ tablet Take 40 mEq by mouth every morning.   simvastatin (ZOCOR) 20 MG tablet Take 20 mg by mouth daily.   spironolactone (ALDACTONE) 25 MG tablet Take 0.5 tablets (12.5 mg total) by mouth daily.   torsemide (DEMADEX) 20 MG tablet Take 60 mg by mouth 2 (two) times daily.   [DISCONTINUED] allopurinol (ZYLOPRIM) 300 MG tablet TAKE ONE-HALF TABLET BY MOUTH  DAILY   [DISCONTINUED] Polyethyl Glycol-Propyl Glycol (SYSTANE OP) Place 1 drop into both eyes 2 (two) times daily as needed (dry eyes).   No facility-administered encounter medications on file as of 02/05/2022.    Review of Systems  Constitutional:  Negative for activity change, appetite change, chills, fatigue and fever.  HENT:  Negative for congestion, sore throat and trouble swallowing.   Eyes:  Negative for visual disturbance.  Respiratory:  Negative for cough, shortness of breath and wheezing.   Cardiovascular:  Negative for chest pain and leg swelling.  Gastrointestinal:  Negative for abdominal distention, abdominal pain, constipation, diarrhea, nausea and vomiting.  Genitourinary:  Negative for frequency and hematuria.  Musculoskeletal:  Positive for arthralgias and gait problem.  Skin:  Positive for wound.  Neurological:  Positive for weakness. Negative for dizziness and headaches.  Psychiatric/Behavioral:  Positive for confusion. Negative for dysphoric mood and sleep disturbance. The patient is not nervous/anxious.     Immunization History  Administered Date(s) Administered   Influenza, High Dose Seasonal PF 04/15/2017, 05/16/2018   Influenza,inj,Quad PF,6+ Mos 04/23/2015   Moderna Sars-Covid-2 Vaccination 07/26/2019, 08/26/2019, 06/06/2020   Pneumococcal Conjugate-13  02/14/2016, 07/23/2016   Pneumococcal Polysaccharide-23 11/13/2012   Td 01/29/2006   Tdap 08/07/2018   Zoster Recombinat (Shingrix) 02/18/2019   Zoster, Live 02/18/2019, 07/01/2019   Pertinent  Health Maintenance Due  Topic Date Due   INFLUENZA VACCINE  02/20/2022      06/24/2018   10:54 AM 08/07/2018   12:05 PM 08/12/2018    3:27 PM 01/27/2019    7:08 AM 01/27/2019   10:15 AM  Fall Risk  Falls in the past year?   1    Was there an injury with Fall?   1    Fall Risk Category Calculator   2    Fall Risk Category   Moderate    Patient Fall Risk Level Moderate fall risk Low fall risk Moderate fall risk Moderate fall risk High fall risk  Patient at Risk for Falls Due to   Impaired balance/gait    Fall risk Follow up   Falls prevention discussed     Functional Status Survey:    Vitals:   02/05/22 1045  BP: 112/61  Pulse: 67  Resp: 20  Temp: 97.8 F (36.6 C)  SpO2: 97%  Weight: 136 lb (61.7 kg)  Height: '5\' 6"'$  (1.676 m)   Body mass index is 21.95 kg/m. Physical Exam Vitals reviewed.  Constitutional:      General: He is not in acute distress. HENT:     Head: Normocephalic.  Eyes:     General:        Right eye: No discharge.        Left eye: No discharge.  Cardiovascular:     Rate and Rhythm: Normal rate and regular rhythm.     Pulses: Normal pulses.     Heart sounds: Murmur heard.  Pulmonary:     Effort: Pulmonary effort is normal. No respiratory distress.     Breath sounds: Normal breath sounds. No wheezing.  Abdominal:     General: Bowel sounds are normal. There is no distension.     Palpations: Abdomen is soft.     Tenderness: There is no abdominal tenderness.  Musculoskeletal:     Left wrist: Swelling present. No tenderness or crepitus. Normal range of motion.     Left hand: Swelling present. No tenderness. Normal range of motion. Normal strength. Normal sensation. Normal capillary refill. Normal pulse.     Cervical back: Neck supple.     Right lower leg: No  edema.     Left lower leg: No edema.     Comments: Left wrist FROM, radial pulse 1+, hand grip 4/5  Skin:  General: Skin is warm and dry.     Capillary Refill: Capillary refill takes less than 2 seconds.     Findings: Lesion present.     Comments: 2 small abrasions to left pointer finger and dorsal wrist- CDI, serosanguinous drainage, dorsal hand swollen and bruised  Neurological:     General: No focal deficit present.     Mental Status: He is alert. Mental status is at baseline.     Motor: Weakness present.     Gait: Gait abnormal.  Psychiatric:        Mood and Affect: Mood normal.        Behavior: Behavior normal.     Comments: Very pleasant, follows commands, alert to self/person/situation     Labs reviewed: Recent Labs    02/22/21 1518 03/13/21 1631 09/14/21 1411 11/06/21 0000  NA 139 138 138 143  K 3.8 4.5 3.5 3.9  CL 102 99 99 106  CO2 '28 31 29 '$ 31*  GLUCOSE 93 96 118*  --   BUN 32* 34* 28* 33*  CREATININE 1.14 1.26* 1.50* 1.2  CALCIUM 8.3* 8.6* 8.4* 8.2*  MG  --  3.0*  --   --    Recent Labs    11/06/21 0000  AST 11*  ALT 5*  ALKPHOS 48  ALBUMIN 3.3*   Recent Labs    11/06/21 0000  WBC 7.1  HGB 12.7*  HCT 39*  PLT 308   Lab Results  Component Value Date   TSH 3.07 11/06/2021   No results found for: "HGBA1C" No results found for: "CHOL", "HDL", "LDLCALC", "LDLDIRECT", "TRIG", "CHOLHDL"  Significant Diagnostic Results in last 30 days:  No results found.  Assessment/Plan 1. Left hand pain - fall 07/15- fell on left hand - left hand bruised, FROM, hand grip 4/5, small abrasion to pointer finger/ dorsal wrist, reports minimal pain - x ray left hand/wrist - cont tylenol prn  2. Fall, initial encounter - see above  3. Moderate dementia without behavioral disturbance, psychotic disturbance, mood disturbance, or anxiety, unspecified dementia type (HCC) - no behavioral outbursts - MMSE 23/30 - cont Aricept and Namenda  4. Recurrent pleural  effusion - followed by Dr. Darcey Nora - has pleurx drain- drain 2x/week - cont metolazone  5. Chest tube in place - see above  6. Yellow nail syndrome - see above  7. Chronic diastolic CHF (congestive heart failure) ([place]) - followed by cardiology - no recent weight fluctuations, sob or edema - cont torsemide and spirolactone    Family/ staff Communication: plan discussed with patient and nurse  Labs/tests ordered:  x ray left hand/wrist

## 2022-02-14 ENCOUNTER — Encounter: Payer: Self-pay | Admitting: Orthopedic Surgery

## 2022-02-14 ENCOUNTER — Non-Acute Institutional Stay: Payer: Medicare Other | Admitting: Orthopedic Surgery

## 2022-02-14 DIAGNOSIS — F4321 Adjustment disorder with depressed mood: Secondary | ICD-10-CM

## 2022-02-14 DIAGNOSIS — E039 Hypothyroidism, unspecified: Secondary | ICD-10-CM | POA: Diagnosis not present

## 2022-02-14 DIAGNOSIS — L605 Yellow nail syndrome: Secondary | ICD-10-CM

## 2022-02-14 DIAGNOSIS — M1A9XX0 Chronic gout, unspecified, without tophus (tophi): Secondary | ICD-10-CM | POA: Diagnosis not present

## 2022-02-14 DIAGNOSIS — M79642 Pain in left hand: Secondary | ICD-10-CM

## 2022-02-14 DIAGNOSIS — F03B0 Unspecified dementia, moderate, without behavioral disturbance, psychotic disturbance, mood disturbance, and anxiety: Secondary | ICD-10-CM

## 2022-02-14 DIAGNOSIS — J90 Pleural effusion, not elsewhere classified: Secondary | ICD-10-CM

## 2022-02-14 DIAGNOSIS — N400 Enlarged prostate without lower urinary tract symptoms: Secondary | ICD-10-CM | POA: Diagnosis not present

## 2022-02-14 DIAGNOSIS — R2681 Unsteadiness on feet: Secondary | ICD-10-CM | POA: Diagnosis not present

## 2022-02-14 DIAGNOSIS — I5032 Chronic diastolic (congestive) heart failure: Secondary | ICD-10-CM | POA: Diagnosis not present

## 2022-02-14 DIAGNOSIS — Z9689 Presence of other specified functional implants: Secondary | ICD-10-CM | POA: Diagnosis not present

## 2022-02-14 DIAGNOSIS — D692 Other nonthrombocytopenic purpura: Secondary | ICD-10-CM

## 2022-02-14 NOTE — Progress Notes (Signed)
Location:   [facility] Room Number: 28 Place of Service:  ALF [phone]) Provider:  Yvonna Alanis, NP  Virgie Dad, MD  Patient Care Team: Virgie Dad, MD as PCP - General (Internal Medicine) Bensimhon, Shaune Pascal, MD as PCP - Advanced Heart Failure (Cardiology)  Extended Emergency Contact Information Primary Emergency Contact: Schopf,Carol Address: [address]          [place] [address] [place] Phone: [phone] Relation: Spouse Secondary Emergency Contact: Rivero,[name] Mobile Phone: [phone] Relation: Son  Code Status:  DNR Goals of care: Advanced Directive information    02/14/2022   10:06 AM  Advanced Directives  Does Patient Have a Medical Advance Directive? Yes  Type of Paramedic of [place];Living will;Out of facility DNR (pink MOST or yellow form)  Does patient want to make changes to medical advance directive? No - Patient declined  Copy of [place] in Chart? Yes - validated most recent copy scanned in chart (See row information)  Pre-existing out of facility DNR order (yellow form or pink MOST form) Pink MOST form placed in chart (order not valid for inpatient use);Yellow form placed in chart (order not valid for inpatient use)     Chief Complaint  Patient presents with   Medical Management of Chronic Issues   Quality Metric Gaps    Verified matrix and NCIR patient is due for #2 shingrix.    HPI:  Pt is a 88 y.o. male seen today for medical management of chronic diseases.    He currently resides on the assisted nursing unit at [facility]. PMH: aortic atherosclerosis, temporal arteritis, H/o TIA, pleural effusion, GERD, optic neuritis, yellow nail syndrome, fibroma of prostate, gout, hyponatremia, and unstable gait.   Dementia- MMSE 23/30, 01/2021 CT head noted chronic vessel changes, no behaviors, doing well in AL, remains on Aricept and  Namenda Unstable gait- 07/17 mechanical fall, fell on left hand, left hand x-ray negative for fracture, ambulates with walker Left hand pain- see above, denies pain today Pleural effusion- followed by Dr. 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, has pleurx drain, remains on metolazone once week (wed) CHF- 03/13/2022 LVEF 55-60%, remains on torsemide and spirolactone Gout- remains on allopurinol Hypothyroidism- TSH 3.07 11/06/2021, remains on levothyroxine Grieving- admitted to [facility] after wife death earlier this year, no mood changes, good family support BPH- no urinary issues, remains on Cardura Weight gain- see trends below  Recent blood pressures:  07/19- 136/76  07/17- 137/79  07/15- 112/61  Recent weights:  07/05- 136 lbs  06/01- 131.8 lbs  05/01- 131.8 lbs  03/30- 130.8 lbs     Past Medical History:  Diagnosis Date   Actinic keratoses    Arthritis    CHF (congestive heart failure) (HCC)    Chronic bilateral pleural effusions    Chronic sinusitis    Colon polyps    Dyspnea 07/13/2013   BNP normal   Edema 07/13/2013   GERD (gastroesophageal reflux disease)    omeprazole 1-2 times daily   H/O TIA (transient ischemic attack) and stroke    on statins and plavix -saw Dr.Sethi in the past   Headache    History of BPH    History of TIA (transient ischemic attack) 07/13/2013   Left inguinal hernia    Left inguinal hernia 01/27/2019   Lumbar radiculopathy    Need for shingles vaccine    Optic neuritis 10/2015   left eye  Perennial allergic rhinitis    Polymyalgia rheumatica ([place]) 07/13/2013   Daily prednisone   Restless leg syndrome    Retinal disease    right eye since birth    Shingles    Temporal arteritis ([place]) 10/2015   Wears glasses    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   Past Surgical History:  Procedure Laterality Date   BRAVO [place] STUDY N/A 04/18/2016   Procedure: BRAVO [place];  Surgeon: Wilford Corner, MD;  Location: [facility];  Service: Endoscopy;   Laterality: N/A;   CHEST TUBE INSERTION Right 02/13/2018   Procedure: INSERTION PLEURAL DRAINAGE CATHETER;  Surgeon: Ivin Poot, MD;  Location: [facility];  Service: Thoracic;  Laterality: Right;   CHEST TUBE INSERTION Left 11/27/2019   Procedure: REDO INSERTION PLEURAL DRAINAGE CATHETER USING 15.5 FR. Bartholomew CATH;  Surgeon: [name], Collier Salina, MD;  Location: [facility];  Service: Thoracic;  Laterality: Left;   COLONOSCOPY     drropy eyelid surgery     ESOPHAGOGASTRODUODENOSCOPY (EGD) WITH PROPOFOL N/A 04/18/2016   Procedure: ESOPHAGOGASTRODUODENOSCOPY (EGD) WITH PROPOFOL;  Surgeon: Wilford Corner, MD;  Location: [facility];  Service: Endoscopy;  Laterality: N/A;   INGUINAL HERNIA REPAIR Left 01/27/2019   Procedure: OPEN REPAIR LEFT INGUINAL HERNIA WITH MESH;  Surgeon: Fanny Skates, MD;  Location: [facility];  Service: General;  Laterality: Left;  GENERAL AND TAP BLOCK ANESTHESIA   INSERTION OF MESH Left 01/27/2019   Procedure: Insertion Of Mesh;  Surgeon: Fanny Skates, MD;  Location: [facility];  Service: General;  Laterality: Left;   IR THORACENTESIS ASP PLEURAL SPACE W/IMG GUIDE  02/04/2018   IR THORACENTESIS ASP PLEURAL SPACE W/IMG GUIDE  02/06/2018   REMOVAL OF PLEURAL DRAINAGE CATHETER Left 11/27/2019   Procedure: REMOVAL OF PLEURAL DRAINAGE CATHETER;  Surgeon: Ivin Poot, MD;  Location: [facility];  Service: Thoracic;  Laterality: Left;   REMOVAL OF PLEURAL DRAINAGE CATHETER Left 03/31/2020   Procedure: REMOVAL OF PLEURAL DRAINAGE CATHETER;  Surgeon: Ivin Poot, MD;  Location: [facility];  Service: Thoracic;  Laterality: Left;   RIGHT/LEFT HEART CATH AND CORONARY ANGIOGRAPHY N/A 06/24/2018   Procedure: RIGHT/LEFT HEART CATH AND CORONARY ANGIOGRAPHY;  Surgeon: Jolaine Artist, MD;  Location: [facility];  Service: Cardiovascular;  Laterality: N/A;   VASECTOMY      No Known Allergies  Allergies as of 02/14/2022   No Known Allergies      Medication List        Accurate as of February 14, 2022 10:06 AM. If you have any questions, ask your nurse or doctor.          acetaminophen 325 MG tablet Commonly known as: TYLENOL Take 650 mg by mouth 4 (four) times daily as needed.   allopurinol 300 MG tablet Commonly known as: ZYLOPRIM Take 150 mg by mouth in the morning.   aspirin EC 81 MG tablet Take 81 mg by mouth daily.   donepezil 10 MG disintegrating tablet Commonly known as: ARICEPT ODT Take 10 mg by mouth at bedtime.   doxazosin 8 MG tablet Commonly known as: CARDURA Take 8 mg by mouth daily.   famotidine 20 MG tablet Commonly known as: PEPCID Take 20 mg by mouth daily as needed for heartburn or indigestion.   levothyroxine 25 MCG tablet Commonly known as: SYNTHROID Take 25 mcg by mouth daily.   memantine 5 MG tablet Commonly known as: NAMENDA Take 5 mg by mouth 2 (two) times daily.   metolazone 2.5 MG  tablet Commonly known as: ZAROXOLYN Take 1 tablet (2.5 mg total) by mouth every Wednesday.   pantoprazole 20 MG tablet Commonly known as: PROTONIX Take 20 mg by mouth daily.   potassium chloride SA 20 MEQ tablet Commonly known as: KLOR-CON M Take 20 mEq by mouth daily.   potassium chloride SA 20 MEQ tablet Commonly known as: KLOR-CON M Take 40 mEq by mouth every morning.   simvastatin 20 MG tablet Commonly known as: ZOCOR Take 20 mg by mouth daily.   spironolactone 25 MG tablet Commonly known as: ALDACTONE Take 0.5 tablets (12.5 mg total) by mouth daily.   torsemide 20 MG tablet Commonly known as: DEMADEX Take 60 mg by mouth 2 (two) times daily.        Review of Systems  Constitutional:  Negative for activity change, appetite change, chills, fatigue and fever.  HENT:  Negative for congestion and sore throat.   Eyes:  Negative for visual disturbance.  Respiratory:  Negative for cough, shortness of breath and wheezing.   Cardiovascular:  Positive for leg swelling. Negative for chest pain.  Gastrointestinal:  Negative for abdominal  distention, abdominal pain, constipation, diarrhea, nausea and vomiting.  Genitourinary:  Positive for frequency. Negative for dysuria and hematuria.  Musculoskeletal:  Positive for gait problem.  Skin:  Negative for wound.  Neurological:  Positive for weakness. Negative for dizziness and headaches.  Psychiatric/Behavioral:  Positive for confusion. Negative for dysphoric mood and sleep disturbance. The patient is not nervous/anxious.     Immunization History  Administered Date(s) Administered   Influenza, High Dose Seasonal PF 04/15/2017, 05/16/2018   Influenza,inj,Quad PF,6+ Mos 04/23/2015   Moderna SARS-COV2 Booster Vaccination 12/28/2020, 04/20/2021   Moderna Sars-Covid-2 Vaccination 07/26/2019, 08/26/2019, 06/06/2020   Pneumococcal Conjugate-13 02/14/2016, 07/23/2016   Pneumococcal Polysaccharide-23 11/13/2012   Td 01/29/2006   Tdap 08/07/2018   Zoster Recombinat (Shingrix) 02/18/2019   Zoster, Live 02/18/2019, 07/01/2019   Pertinent  Health Maintenance Due  Topic Date Due   INFLUENZA VACCINE  02/20/2022      06/24/2018   10:54 AM 08/07/2018   12:05 PM 08/12/2018    3:27 PM 01/27/2019    7:08 AM 01/27/2019   10:15 AM  Fall Risk  Falls in the past year?   1    Was there an injury with Fall?   1    Fall Risk Category Calculator   2    Fall Risk Category   Moderate    Patient Fall Risk Level Moderate fall risk Low fall risk Moderate fall risk Moderate fall risk High fall risk  Patient at Risk for Falls Due to   Impaired balance/gait    Fall risk Follow up   Falls prevention discussed     Functional Status Survey:    Vitals:   02/14/22 0956  BP: 136/76  Pulse: 62  Resp: 18  Temp: (!) 97.1 F (36.2 C)  SpO2: 96%  Weight: 136 lb (61.7 kg)  Height: '5\' 6"'$  (1.676 m)   Body mass index is 21.95 kg/m. Physical Exam Vitals reviewed.  Constitutional:      General: He is not in acute distress. HENT:     Head: Normocephalic.     Right Ear: There is no impacted cerumen.      Left Ear: There is no impacted cerumen.     Nose: Nose normal.     Mouth/Throat:     Mouth: Mucous membranes are moist.  Eyes:     General:  Right eye: No discharge.        Left eye: No discharge.  Cardiovascular:     Rate and Rhythm: Normal rate and regular rhythm.     Pulses: Normal pulses.     Heart sounds: Normal heart sounds.  Pulmonary:     Effort: Pulmonary effort is normal. No respiratory distress.     Breath sounds: Rales present. No wheezing.  Abdominal:     General: Bowel sounds are normal. There is no distension.     Tenderness: There is no abdominal tenderness.  Musculoskeletal:     Cervical back: Neck supple.     Right lower leg: Edema present.     Left lower leg: Edema present.     Comments: Non pitting, ted hose on, left hand with FROM  Skin:    General: Skin is warm and dry.     Capillary Refill: Capillary refill takes less than 2 seconds.     Comments: Non tender purple lesions to extremities, no skin breakdown, vary in size, left and bruising resolved, fingernails thick and yellow  Neurological:     General: No focal deficit present.     Mental Status: He is alert. Mental status is at baseline.     Motor: Weakness present.     Gait: Gait abnormal.     Comments: walker  Psychiatric:        Mood and Affect: Mood normal.        Behavior: Behavior normal.        Cognition and Memory: Cognition is impaired. Memory is impaired.     Comments: Very pleasant, follows commands, alert to self/person/place     Labs reviewed: Recent Labs    02/22/21 1518 03/13/21 1631 09/14/21 1411 11/06/21 0000  NA 139 138 138 143  K 3.8 4.5 3.5 3.9  CL 102 99 99 106  CO2 '28 31 29 '$ 31*  GLUCOSE 93 96 118*  --   BUN 32* 34* 28* 33*  CREATININE 1.14 1.26* 1.50* 1.2  CALCIUM 8.3* 8.6* 8.4* 8.2*  MG  --  3.0*  --   --    Recent Labs    11/06/21 0000  AST 11*  ALT 5*  ALKPHOS 48  ALBUMIN 3.3*   Recent Labs    11/06/21 0000  WBC 7.1  HGB 12.7*  HCT 39*   PLT 308   Lab Results  Component Value Date   TSH 3.07 11/06/2021   No results found for: "HGBA1C" No results found for: "CHOL", "HDL", "LDLCALC", "LDLDIRECT", "TRIG", "CHOLHDL"  Significant Diagnostic Results in last 30 days:  No results found.  Assessment/Plan 1. Moderate dementia without behavioral disturbance, psychotic disturbance, mood disturbance, or anxiety, unspecified dementia type (HCC) - no behaviors - ambulates with walker - cont Aricept and Namenda  2. Unstable gait - 07/17 mechanical fall, bruising to left hand - cont falls safety precautions  3. Left hand pain - 07/17 x- ray negative for fracture - resolved at this time  4. Recurrent pleural effusion - followed by Dr. Lawson Fiscal - has pleurx drain- drains 2x/week - cont metolazone  5. Chest tube in place - see above  6. Yellow nail syndrome - see above  7. Chronic diastolic CHF (congestive heart failure) ([place]) - followed by cardiology - no weight fluctuations, some sob and ankle edema - cont torsemide and spirolactone  8. Grieving - no mood changes - wife passes earlier this year - good family support  82. Chronic gout without tophus, unspecified cause,  unspecified site - no recent flares - cont allopurinol  10. Acquired hypothyroidism - TSH stable - cont levothyroxine  11. Benign prostatic hyperplasia, unspecified whether lower urinary tract symptoms present - cont Cardura  12. Senile purpura ([place]) - education given    Family/ staff Communication: plan discussed with patient and nurse  Labs/tests ordered: none

## 2022-02-19 DIAGNOSIS — F331 Major depressive disorder, recurrent, moderate: Secondary | ICD-10-CM | POA: Diagnosis not present

## 2022-02-19 DIAGNOSIS — F4321 Adjustment disorder with depressed mood: Secondary | ICD-10-CM | POA: Diagnosis not present

## 2022-03-08 DIAGNOSIS — J918 Pleural effusion in other conditions classified elsewhere: Secondary | ICD-10-CM | POA: Diagnosis not present

## 2022-03-14 ENCOUNTER — Other Ambulatory Visit: Payer: Self-pay | Admitting: Orthopedic Surgery

## 2022-03-14 DIAGNOSIS — F03B0 Unspecified dementia, moderate, without behavioral disturbance, psychotic disturbance, mood disturbance, and anxiety: Secondary | ICD-10-CM

## 2022-03-14 MED ORDER — MEMANTINE HCL 5 MG PO TABS: 5.0000 mg | ORAL_TABLET | Freq: Two times a day (BID) | ORAL | 5 refills | Status: DC

## 2022-03-19 ENCOUNTER — Encounter: Payer: Self-pay | Admitting: Orthopedic Surgery

## 2022-03-19 ENCOUNTER — Non-Acute Institutional Stay: Payer: Medicare Other | Admitting: Orthopedic Surgery

## 2022-03-19 DIAGNOSIS — F331 Major depressive disorder, recurrent, moderate: Secondary | ICD-10-CM | POA: Diagnosis not present

## 2022-03-19 DIAGNOSIS — Z0000 Encounter for general adult medical examination without abnormal findings: Secondary | ICD-10-CM | POA: Diagnosis not present

## 2022-03-19 DIAGNOSIS — F03C0 Unspecified dementia, severe, without behavioral disturbance, psychotic disturbance, mood disturbance, and anxiety: Secondary | ICD-10-CM | POA: Diagnosis not present

## 2022-03-19 DIAGNOSIS — F4321 Adjustment disorder with depressed mood: Secondary | ICD-10-CM | POA: Diagnosis not present

## 2022-03-19 NOTE — Progress Notes (Signed)
Subjective:   Stuart Watson is a 88 y.o. male who presents for Medicare Annual/Subsequent preventive examination.  Place of Service: [facility]- assisted living Provider: Windell Moulding, AGNP-C   Review of Systems           Objective:    Today's Vitals   03/19/22 1250  BP: 131/86  Pulse: 64  Resp: 20  Temp: 98.7 F (37.1 C)  SpO2: 96%  Weight: 134 lb 12.8 oz (61.1 kg)  Height: '5\' 6"'$  (1.676 m)   Body mass index is 21.76 kg/m.     02/14/2022   10:06 AM 02/05/2022   11:04 AM 11/02/2021    2:05 PM 11/27/2019    7:24 AM 01/19/2019    9:22 AM 08/12/2018    3:28 PM 08/07/2018   12:05 PM  Advanced Directives  Does Patient Have a Medical Advance Directive? Yes Yes Yes No Yes Yes Yes  Type of Paramedic of [place];Living will;Out of facility DNR (pink MOST or yellow form) [place];Living will;Out of facility DNR (pink MOST or yellow form) Healthcare Power of [place];Living will [place];Living will [place];Living will  Does patient want to make changes to medical advance directive? No - Patient declined No - Patient declined No - Patient declined  No - Patient declined No - Patient declined   Copy of [place] in Chart? Yes - validated most recent copy scanned in chart (See row information) Yes - validated most recent copy scanned in chart (See row information) Yes - validated most recent copy scanned in chart (See row information)  No - copy requested Yes - validated most recent copy scanned in chart (See row information)   Pre-existing out of facility DNR order (yellow form or pink MOST form) Pink MOST form placed in chart (order not valid for inpatient use);Yellow form placed in chart (order not valid for inpatient use) Pink MOST form placed in chart (order not valid for inpatient use);Yellow form placed in chart (order not valid for inpatient use)          Current Medications (verified) Outpatient Encounter Medications as of 03/19/2022  Medication Sig   acetaminophen (TYLENOL) 325 MG tablet Take 650 mg by mouth 4 (four) times daily as needed.   allopurinol (ZYLOPRIM) 300 MG tablet Take 150 mg by mouth in the morning.   aspirin EC 81 MG tablet Take 81 mg by mouth daily.   donepezil (ARICEPT ODT) 10 MG disintegrating tablet Take 10 mg by mouth at bedtime.   doxazosin (CARDURA) 8 MG tablet Take 8 mg by mouth daily.   famotidine (PEPCID) 20 MG tablet Take 20 mg by mouth daily as needed for heartburn or indigestion.   levothyroxine (SYNTHROID) 25 MCG tablet Take 25 mcg by mouth daily.   memantine (NAMENDA) 5 MG tablet Take 1 tablet (5 mg total) by mouth 2 (two) times daily.   metolazone (ZAROXOLYN) 2.5 MG tablet Take 1 tablet (2.5 mg total) by mouth every Wednesday.   pantoprazole (PROTONIX) 20 MG tablet Take 20 mg by mouth daily.    potassium chloride SA (KLOR-CON M) 20 MEQ tablet Take 20 mEq by mouth daily.   potassium chloride SA (KLOR-CON M) 20 MEQ tablet Take 40 mEq by mouth every morning.   simvastatin (ZOCOR) 20 MG tablet Take 20 mg by mouth daily.   spironolactone (ALDACTONE) 25 MG tablet Take 0.5 tablets (12.5 mg total) by  mouth daily.   torsemide (DEMADEX) 20 MG tablet Take 60 mg by mouth 2 (two) times daily.   No facility-administered encounter medications on file as of 03/19/2022.    Allergies (verified) Patient has no known allergies.   History: Past Medical History:  Diagnosis Date   Actinic keratoses    Arthritis    CHF (congestive heart failure) (HCC)    Chronic bilateral pleural effusions    Chronic sinusitis    Colon polyps    Dyspnea 07/13/2013   BNP normal   Edema 07/13/2013   GERD (gastroesophageal reflux disease)    omeprazole 1-2 times daily   H/O TIA (transient ischemic attack) and stroke    on statins and plavix -saw Dr.Sethi in the past   Headache    History of BPH    History of TIA (transient  ischemic attack) 07/13/2013   Left inguinal hernia    Left inguinal hernia 01/27/2019   Lumbar radiculopathy    Need for shingles vaccine    Optic neuritis 10/2015   left eye   Perennial allergic rhinitis    Polymyalgia rheumatica ([place]) 07/13/2013   Daily prednisone   Restless leg syndrome    Retinal disease    right eye since birth    Shingles    Temporal arteritis ([place]) 10/2015   Wears glasses    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   Past Surgical History:  Procedure Laterality Date   BRAVO [place] STUDY N/A 04/18/2016   Procedure: BRAVO [place] STUDY;  Surgeon: Wilford Corner, MD;  Location: [facility];  Service: Endoscopy;  Laterality: N/A;   CHEST TUBE INSERTION Right 02/13/2018   Procedure: INSERTION PLEURAL DRAINAGE CATHETER;  Surgeon: Ivin Poot, MD;  Location: [facility];  Service: Thoracic;  Laterality: Right;   CHEST TUBE INSERTION Left 11/27/2019   Procedure: REDO INSERTION PLEURAL DRAINAGE CATHETER USING 15.5 FR. [place] CATH;  Surgeon: [name], Collier Salina, MD;  Location: [facility];  Service: Thoracic;  Laterality: Left;   COLONOSCOPY     drropy eyelid surgery     ESOPHAGOGASTRODUODENOSCOPY (EGD) WITH PROPOFOL N/A 04/18/2016   Procedure: ESOPHAGOGASTRODUODENOSCOPY (EGD) WITH PROPOFOL;  Surgeon: Wilford Corner, MD;  Location: [facility];  Service: Endoscopy;  Laterality: N/A;   INGUINAL HERNIA REPAIR Left 01/27/2019   Procedure: OPEN REPAIR LEFT INGUINAL HERNIA WITH MESH;  Surgeon: Fanny Skates, MD;  Location: [facility];  Service: General;  Laterality: Left;  GENERAL AND TAP BLOCK ANESTHESIA   INSERTION OF MESH Left 01/27/2019   Procedure: Insertion Of Mesh;  Surgeon: Fanny Skates, MD;  Location: [facility];  Service: General;  Laterality: Left;   IR THORACENTESIS ASP PLEURAL SPACE W/IMG GUIDE  02/04/2018   IR THORACENTESIS ASP PLEURAL SPACE W/IMG GUIDE  02/06/2018   REMOVAL OF PLEURAL DRAINAGE CATHETER Left 11/27/2019   Procedure: REMOVAL OF PLEURAL DRAINAGE CATHETER;  Surgeon: Ivin Poot, MD;   Location: [facility];  Service: Thoracic;  Laterality: Left;   REMOVAL OF PLEURAL DRAINAGE CATHETER Left 03/31/2020   Procedure: REMOVAL OF PLEURAL DRAINAGE CATHETER;  Surgeon: Ivin Poot, MD;  Location: [facility];  Service: Thoracic;  Laterality: Left;   RIGHT/LEFT HEART CATH AND CORONARY ANGIOGRAPHY N/A 06/24/2018   Procedure: RIGHT/LEFT HEART CATH AND CORONARY ANGIOGRAPHY;  Surgeon: Jolaine Artist, MD;  Location: [facility];  Service: Cardiovascular;  Laterality: N/A;   VASECTOMY     Family History  Problem Relation Age of Onset   Stroke Mother    Coronary artery disease Mother  Diabetes Mother    Breast cancer Mother    Heart attack Father    Coronary artery disease Father    Diabetes Father    Graves' disease Sister    Social History   Socioeconomic History   Marital status: Married    Spouse name: Not on file   Number of children: 2   Years of education: Not on file   Highest education level: Not on file  Occupational History   Occupation: retired  Tobacco Use   Smoking status: Former    Packs/day: 1.00    Years: 15.00    Total pack years: 15.00    Types: Cigarettes    Quit date: 07/23/1975    Years since quitting: 46.6   Smokeless tobacco: Never  Vaping Use   Vaping Use: Never used  Substance and Sexual Activity   Alcohol use: Yes    Alcohol/week: 7.0 standard drinks of alcohol    Types: 7 Glasses of wine per week    Comment: 1 glass red wine daily   Drug use: No   Sexual activity: Not on file  Other Topics Concern   Not on file  Social History Narrative   Not on file   Social Determinants of Health   Financial Resource Strain: Not on file  Food Insecurity: Not on file  Transportation Needs: Not on file  Physical Activity: Not on file  Stress: Not on file  Social Connections: Not on file    Tobacco Counseling Counseling given: Not Answered   Clinical Intake:                 Diabetic? No         Activities of Daily  Living     No data to display          Patient Care Team: Virgie Dad, MD as PCP - General (Internal Medicine) Bensimhon, Shaune Pascal, MD as PCP - Advanced Heart Failure (Cardiology)  Indicate any recent Medical Services you may have received from other than [facility] providers in the past year (date may be approximate).     Assessment:   This is a routine wellness examination for [place].  Hearing/Vision screen No results found.  Dietary issues and exercise activities discussed:     Goals Addressed   None    Depression Screen    08/12/2018    3:27 PM  PHQ 2/9 Scores  PHQ - 2 Score 0    Fall Risk    08/12/2018    3:27 PM  [place] in the past year? 1  Number falls in past yr: 0  Injury with Fall? 1  Risk for fall due to : Impaired balance/gait  Follow up Falls prevention discussed    FALL RISK PREVENTION PERTAINING TO THE HOME:  Any stairs in or around the home? No  If so, are there any without handrails? No  Home free of loose throw rugs in walkways, pet beds, electrical cords, etc? Yes  Adequate lighting in your home to reduce risk of falls? Yes   ASSISTIVE DEVICES UTILIZED TO PREVENT FALLS:  Life alert? No  Use of a cane, walker or w/c? No  Grab bars in the bathroom? No  Shower chair or bench in shower? No  Elevated toilet seat or a handicapped toilet? Yes   TIMED UP AND GO:  Was the test performed? No .  Length of time to ambulate 10 feet: N/A sec.   Gait slow and  steady with assistive device  Cognitive Function:        Immunizations Immunization History  Administered Date(s) Administered   Influenza, High Dose Seasonal PF 04/15/2017, 05/16/2018   Influenza,inj,Quad PF,6+ Mos 04/23/2015   Moderna SARS-COV2 Booster Vaccination 12/28/2020, 04/20/2021   Moderna Sars-Covid-2 Vaccination 07/26/2019, 08/26/2019, 06/06/2020   Pneumococcal Conjugate-13 02/14/2016, 07/23/2016   Pneumococcal Polysaccharide-23 11/13/2012   Td 01/29/2006    Tdap 08/07/2018   Zoster Recombinat (Shingrix) 02/18/2019   Zoster, Live 02/18/2019, 07/01/2019    TDAP status: Up to date  Flu Vaccine status: Due, Education has been provided regarding the importance of this vaccine. Advised may receive this vaccine at local pharmacy or Health Dept. Aware to provide a copy of the vaccination record if obtained from local pharmacy or Health Dept. Verbalized acceptance and understanding.  Pneumococcal vaccine status: Up to date  Covid-19 vaccine status: Completed vaccines  Qualifies for Shingles Vaccine? Yes   Zostavax completed Yes   Shingrix Completed?: Yes  Screening Tests Health Maintenance  Topic Date Due   Zoster Vaccines- Shingrix (2 of 2) 04/15/2019   INFLUENZA VACCINE  02/20/2022   COVID-19 Vaccine (4 - Moderna series) 06/20/2022 (Originally 06/15/2021)   TETANUS/TDAP  08/07/2028   Pneumonia Vaccine 71+ Years old  Completed   HPV VACCINES  Aged Out    Health Maintenance  Health Maintenance Due  Topic Date Due   Zoster Vaccines- Shingrix (2 of 2) 04/15/2019   INFLUENZA VACCINE  02/20/2022    Colorectal cancer screening: No longer required.   Lung Cancer Screening: (Low Dose CT Chest recommended if Age 16-80 years, 30 pack-year currently smoking OR have quit w/in 15years.) does not qualify.   Lung Cancer Screening Referral: No  Additional Screening:  Hepatitis C Screening: does not qualify; Completed   Vision Screening: Recommended annual ophthalmology exams for early detection of glaucoma and other disorders of the eye. Is the patient up to date with their annual eye exam?  No  Who is the provider or what is the name of the office in which the patient attends annual eye exams? Cannot recall If pt is not established with a provider, would they like to be referred to a provider to establish care? No .   Dental Screening: Recommended annual dental exams for proper oral hygiene  Community Resource Referral / Chronic Care  Management: CRR required this visit?  No   CCM required this visit?  No      Plan:     I have personally reviewed and noted the following in the patient's chart:   Medical and social history Use of alcohol, tobacco or illicit drugs  Current medications and supplements including opioid prescriptions. Patient is not currently taking opioid prescriptions. Functional ability and status Nutritional status Physical activity Advanced directives List of other physicians Hospitalizations, surgeries, and ER visits in previous 12 months Vitals Screenings to include cognitive, depression, and falls Referrals and appointments  In addition, I have reviewed and discussed with patient certain preventive protocols, quality metrics, and best practice recommendations. A written personalized care plan for preventive services as well as general preventive health recommendations were provided to patient.     Yvonna Alanis, NP   03/19/2022

## 2022-03-19 NOTE — Patient Instructions (Signed)
  Mr. Lecomte , Thank you for taking time to come for your Medicare Wellness Visit. I appreciate your ongoing commitment to your health goals. Please review the following plan we discussed and let me know if I can assist you in the future.   These are the goals we discussed:  Goals      Maintain Mobility and Function     Evidence-based guidance:  Emphasize the importance of physical activity and aerobic exercise as included in treatment plan; assess barriers to adherence; consider patient's abilities and preferences.  Encourage gradual increase in activity or exercise instead of stopping if pain occurs.  Reinforce individual therapy exercise prescription, such as strengthening, stabilization and stretching programs.  Promote optimal body mechanics to stabilize the spine with lifting and functional activity.  Encourage activity and mobility modifications to facilitate optimal function, such as using a log roll for bed mobility or dressing from a seated position.  Reinforce individual adaptive equipment recommendations to limit excessive spinal movements, such as a Systems analyst.  Assess adequacy of sleep; encourage use of sleep hygiene techniques, such as bedtime routine; use of white noise; dark, cool bedroom; avoiding daytime naps, heavy meals or exercise before bedtime.  Promote positions and modification to optimize sleep and sexual activity; consider pillows or positioning devices to assist in maintaining neutral spine.  Explore options for applying ergonomic principles at work and home, such as frequent position changes, using ergonomically designed equipment and working at optimal height.  Promote modifications to increase comfort with driving such as lumbar support, optimizing seat and steering wheel position, using cruise control and taking frequent rest stops to stretch and walk.   Notes:         This is a list of the screening recommended for you and due dates:  Health  Maintenance  Topic Date Due   Zoster (Shingles) Vaccine (2 of 2) 04/15/2019   Flu Shot  02/20/2022   COVID-19 Vaccine (4 - Moderna series) 06/20/2022*   Tetanus Vaccine  08/07/2028   Pneumonia Vaccine  Completed   HPV Vaccine  Aged Out  *Topic was postponed. The date shown is not the original due date.

## 2022-04-02 ENCOUNTER — Telehealth: Payer: Self-pay | Admitting: *Deleted

## 2022-04-02 ENCOUNTER — Other Ambulatory Visit: Payer: Self-pay | Admitting: *Deleted

## 2022-04-02 NOTE — Telephone Encounter (Signed)
Patient's son Stuart Watson, contacted the office stating patient's last three draining's yielded 151m, 1080m and 7568mSon wishing to change drainage frequency from twice weekly to once weekly. Son with concerns over whether or not draining once weekly will increase drainage amounts. Patient has a follow up virtual appt with Dr. VanPrescott Gumxt Monday. Advised to wait to speak with Dr. VanPrescott Gumen to discuss concerns. [name] verbalized understanding.

## 2022-04-09 ENCOUNTER — Ambulatory Visit (INDEPENDENT_AMBULATORY_CARE_PROVIDER_SITE_OTHER): Payer: Medicare Other | Admitting: Cardiothoracic Surgery

## 2022-04-09 DIAGNOSIS — J949 Pleural condition, unspecified: Secondary | ICD-10-CM | POA: Insufficient documentation

## 2022-04-09 DIAGNOSIS — F331 Major depressive disorder, recurrent, moderate: Secondary | ICD-10-CM | POA: Diagnosis not present

## 2022-04-09 NOTE — Progress Notes (Signed)
Patient ID: Stuart Watson, male   DOB: [birth year], 88 y.o.   MRN: [id number]      [name] VIRTUAL OFFICE NOTE  Referring Provider is Stuart Dad, MD Primary Cardiologist is None PCP is Stuart Dad, MD   HPI:  I spoke with Stuart Watson (DOB 03/23/1932 ) via telephone on 04/09/2022 at 5:06 PM and verified that I was speaking with the correct person using more than one form of identification.  We discussed the reason(s) for conducting our visit virtually instead of in-person.  The patient expressed understanding the circumstances and agreed to proceed as described.  Phone call completed to review function of his right Pleurx catheter for recurrent benign pleural effusion.  Catheter drainage schedule is now twice a week Monday-Thursday.  Last 5 drainage volumes going back from today are 75 cc, 75 cc, 100 cc, 150 cc, 150 cc.  No new shortness of breath.  No drainage or signs of infection at the exit site.  Facility RNs are doing the drainage procedures.  No change in weight in the past 2 months.  These drainage volumes are significantly lower so we will change drainage schedule to once weekly on Mondays in order to reduce manipulation of the catheter and to reduce risk of infection.  Patient and son counseled on the importance of maintaining good protein in his diet to reduce the amount of pleural fluid. Current Outpatient Medications  Medication Sig Dispense Refill   acetaminophen (TYLENOL) 325 MG tablet Take 650 mg by mouth 4 (four) times daily as needed.     allopurinol (ZYLOPRIM) 300 MG tablet Take 150 mg by mouth in the morning.     aspirin EC 81 MG tablet Take 81 mg by mouth daily.     donepezil (ARICEPT ODT) 10 MG disintegrating tablet Take 10 mg by mouth at bedtime.     doxazosin (CARDURA) 8 MG tablet Take 8 mg by mouth daily.     famotidine (PEPCID) 20 MG tablet Take  20 mg by mouth daily as needed for heartburn or indigestion.     levothyroxine (SYNTHROID) 25 MCG tablet Take 25 mcg by mouth daily.     memantine (NAMENDA) 5 MG tablet Take 1 tablet (5 mg total) by mouth 2 (two) times daily. 60 tablet 5   metolazone (ZAROXOLYN) 2.5 MG tablet Take 1 tablet (2.5 mg total) by mouth every Wednesday. 20 tablet 3   pantoprazole (PROTONIX) 20 MG tablet Take 20 mg by mouth daily.      potassium chloride SA (KLOR-CON M) 20 MEQ tablet Take 20 mEq by mouth daily.     potassium chloride SA (KLOR-CON M) 20 MEQ tablet Take 40 mEq by mouth every morning.     simvastatin (ZOCOR) 20 MG tablet Take 20 mg by mouth daily.     spironolactone (ALDACTONE) 25 MG tablet Take 0.5 tablets (12.5 mg total) by mouth daily. 45 tablet 3   torsemide (DEMADEX) 20 MG tablet Take 60 mg by mouth 2 (two) times daily.     No current facility-administered medications for this visit.     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 Decreasing drainage volumes over the past 2 to 3 weeks using a twice weekly drainage schedule. Plan: Reduce  drainage schedule to once a week every Monday.  We will contact the facility RN for orders.  Patient will let us know if he has any change in his breathing pattern or discomfort with his change. Plan repeat phone call visit in 2 months.    I discussed limitations of evaluation and management via telephone.  The patient was advised to call back for repeat telephone consultation or to seek an in-person evaluation if questions arise or the patient's clinical condition changes in any significant manner.  I spent in excess of 5 minutes of non-face-to-face time during the conduct of this telephone virtual office consultation.  Level 1  (99441)             5-10 minutes Level 2  (99442)            11-20 minutes Level 3  (99443)            21-30 minutes    04/09/2022 5:06 PM

## 2022-04-10 ENCOUNTER — Other Ambulatory Visit: Payer: Self-pay | Admitting: *Deleted

## 2022-04-10 DIAGNOSIS — J90 Pleural effusion, not elsewhere classified: Secondary | ICD-10-CM

## 2022-04-10 NOTE — Progress Notes (Signed)
Per Dr. [name], order to change pleurx catheter drainage to once per week on Monday's faxed to [facility] at [phone].

## 2022-04-16 DIAGNOSIS — F4321 Adjustment disorder with depressed mood: Secondary | ICD-10-CM | POA: Diagnosis not present

## 2022-04-16 DIAGNOSIS — F331 Major depressive disorder, recurrent, moderate: Secondary | ICD-10-CM | POA: Diagnosis not present

## 2022-04-16 DIAGNOSIS — F03C0 Unspecified dementia, severe, without behavioral disturbance, psychotic disturbance, mood disturbance, and anxiety: Secondary | ICD-10-CM | POA: Diagnosis not present

## 2022-04-16 DIAGNOSIS — J918 Pleural effusion in other conditions classified elsewhere: Secondary | ICD-10-CM | POA: Diagnosis not present

## 2022-05-10 ENCOUNTER — Non-Acute Institutional Stay: Payer: Medicare Other | Admitting: Internal Medicine

## 2022-05-10 ENCOUNTER — Encounter: Payer: Self-pay | Admitting: Internal Medicine

## 2022-05-10 DIAGNOSIS — I5032 Chronic diastolic (congestive) heart failure: Secondary | ICD-10-CM

## 2022-05-10 DIAGNOSIS — M1A9XX0 Chronic gout, unspecified, without tophus (tophi): Secondary | ICD-10-CM

## 2022-05-10 DIAGNOSIS — Z9689 Presence of other specified functional implants: Secondary | ICD-10-CM

## 2022-05-10 DIAGNOSIS — F03B0 Unspecified dementia, moderate, without behavioral disturbance, psychotic disturbance, mood disturbance, and anxiety: Secondary | ICD-10-CM | POA: Diagnosis not present

## 2022-05-10 DIAGNOSIS — E039 Hypothyroidism, unspecified: Secondary | ICD-10-CM | POA: Diagnosis not present

## 2022-05-10 DIAGNOSIS — J90 Pleural effusion, not elsewhere classified: Secondary | ICD-10-CM | POA: Diagnosis not present

## 2022-05-10 NOTE — Progress Notes (Signed)
Location:  [facility] of Service:  ALF [phone]) Provider:  Virgie Dad, MD   Virgie Dad, MD  Patient Care Team: Virgie Dad, MD as PCP - General (Internal Medicine) Bensimhon, Shaune Pascal, MD as PCP - Advanced Heart Failure (Cardiology) Wenda Low, MD as Consulting Physician (Internal Medicine)  Extended Emergency Contact Information Primary Emergency Contact: [place] Mobile Phone: [phone] Relation: Son Secondary Emergency Contact: Dun[place],Carol Address: [address]          [place] [address] Johnnette Litter of [place] Mobile Phone: [phone] Relation: Spouse  Code Status:  DNR Goals of care: Advanced Directive information    05/10/2022    3:46 PM  Advanced Directives  Does Patient Have a Medical Advance Directive? Yes  Type of Paramedic of [place];Living will;Out of facility DNR (pink MOST or yellow form)  Does patient want to make changes to medical advance directive? No - Patient declined  Copy of [place] in Chart? Yes - validated most recent copy scanned in chart (See row information)     Chief Complaint  Patient presents with   Medical Management of Chronic Issues    Patient being seen for a routine vist   Immunizations    Discussed the need for shingles and COVID Vaccine    HPI:  Pt is a 88 y.o. male seen today for medical management of chronic diseases.    Lives in [facility] with no assist Recently having issues with Aphasia worsening. Also Urinary Incontinence with few accidents but mostly able to do his ADLS C/o Cough which is chronic  Wt Readings from Last 3 Encounters:  05/10/22 143 lb 9.6 oz (65.1 kg)  03/19/22 134 lb 12.8 oz (61.1 kg)  02/14/22 136 lb (61.7 kg)     Patient also has h/o  CHF Followed By Dr [name] 2 D echo has shown EF of 55%. 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 But Now has right Catheter. Drains 1/week.   Dementia Patient has been noticed to be having Memory issues for past few years  Has been on Aricept MMSE done here in Facility by Speech showed He missed out on calculations and Recall Also Failed Clock Drawing    Past Medical History:  Diagnosis Date   Actinic keratoses    Arthritis    CHF (congestive heart failure) ([place])    Chronic bilateral pleural effusions    Chronic sinusitis    Colon polyps    Dyspnea 07/13/2013   BNP normal   Edema 07/13/2013   GERD (gastroesophageal reflux disease)    omeprazole 1-2 times daily   H/O TIA (transient ischemic attack) and stroke    on statins and plavix -saw Dr.Sethi in the past   Headache    History of BPH    History of TIA (transient ischemic attack) 07/13/2013   Left inguinal hernia    Left inguinal hernia 01/27/2019   Lumbar radiculopathy    Need for shingles vaccine    Optic neuritis 10/2015   left eye   Perennial allergic rhinitis    Polymyalgia rheumatica ([place]) 07/13/2013   Daily prednisone   Restless leg syndrome    Retinal disease    right eye since birth    Shingles    Temporal arteritis ([place]) 10/2015   Wears glasses    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  Past Surgical History:  Procedure Laterality Date   BRAVO [place] STUDY N/A 04/18/2016   Procedure: BRAVO [place];  Surgeon: Wilford Corner, MD;  Location: [facility];  Service: Endoscopy;  Laterality: N/A;   CHEST TUBE INSERTION Right 02/13/2018   Procedure: INSERTION PLEURAL DRAINAGE CATHETER;  Surgeon: Ivin Poot, MD;  Location: [place];  Service: Thoracic;  Laterality: Right;   CHEST TUBE INSERTION Left 11/27/2019   Procedure: REDO INSERTION PLEURAL DRAINAGE CATHETER USING 15.5 FR. [place] CATH;  Surgeon: [name], Collier Salina, MD;  Location: [place];  Service: Thoracic;  Laterality: Left;   COLONOSCOPY     drropy eyelid surgery     ESOPHAGOGASTRODUODENOSCOPY (EGD) WITH PROPOFOL N/A  04/18/2016   Procedure: ESOPHAGOGASTRODUODENOSCOPY (EGD) WITH PROPOFOL;  Surgeon: Wilford Corner, MD;  [facility];  Service: Endoscopy;  Laterality: N/A;   INGUINAL HERNIA REPAIR Left 01/27/2019   Procedure: OPEN REPAIR LEFT INGUINAL HERNIA WITH MESH;  Surgeon: Fanny Skates, MD;  Location: [place];  Service: General;  Laterality: Left;  GENERAL AND TAP BLOCK ANESTHESIA   INSERTION OF MESH Left 01/27/2019   Procedure: Insertion Of Mesh;  Surgeon: Fanny Skates, MD;  Location: [place];  Service: General;  Laterality: Left;   IR THORACENTESIS ASP PLEURAL SPACE W/IMG GUIDE  02/04/2018   IR THORACENTESIS ASP PLEURAL SPACE W/IMG GUIDE  02/06/2018   REMOVAL OF PLEURAL DRAINAGE CATHETER Left 11/27/2019   Procedure: REMOVAL OF PLEURAL DRAINAGE CATHETER;  Surgeon: Ivin Poot, MD;  Location: [place];  Service: Thoracic;  Laterality: Left;   REMOVAL OF PLEURAL DRAINAGE CATHETER Left 03/31/2020   Procedure: REMOVAL OF PLEURAL DRAINAGE CATHETER;  Surgeon: Ivin Poot, MD;  Location: [place];  Service: Thoracic;  Laterality: Left;   RIGHT/LEFT HEART CATH AND CORONARY ANGIOGRAPHY N/A 06/24/2018   Procedure: RIGHT/LEFT HEART CATH AND CORONARY ANGIOGRAPHY;  Surgeon: Jolaine Artist, MD;  Location: [place] [facility];  Service: Cardiovascular;  Laterality: N/A;   VASECTOMY      No Known Allergies  Outpatient Encounter Medications as of 05/10/2022  Medication Sig   acetaminophen (TYLENOL) 325 MG tablet Take 650 mg by mouth 4 (four) times daily as needed.   allopurinol (ZYLOPRIM) 300 MG tablet Take 150 mg by mouth in the morning.   aspirin EC 81 MG tablet Take 81 mg by mouth daily.   donepezil (ARICEPT ODT) 10 MG disintegrating tablet Take 10 mg by mouth at bedtime.   doxazosin (CARDURA) 8 MG tablet Take 8 mg by mouth daily.   famotidine (PEPCID) 20 MG tablet Take 20 mg by mouth daily as needed for heartburn or indigestion.   levothyroxine (SYNTHROID) 25 MCG tablet Take 25 mcg by mouth daily.    memantine (NAMENDA) 5 MG tablet Take 1 tablet (5 mg total) by mouth 2 (two) times daily.   metolazone (ZAROXOLYN) 2.5 MG tablet Take 1 tablet (2.5 mg total) by mouth every Wednesday.   pantoprazole (PROTONIX) 20 MG tablet Take 20 mg by mouth daily.    potassium chloride SA (KLOR-CON M) 20 MEQ tablet Take 20 mEq by mouth daily.   potassium chloride SA (KLOR-CON M) 20 MEQ tablet Take 20 mEq by mouth daily. Give 2 tablets by mouth in the morning for mineral/supplement   simvastatin (ZOCOR) 20 MG tablet Take 20 mg by mouth daily.   spironolactone (ALDACTONE) 25 MG tablet Take 0.5 tablets (12.5 mg total) by mouth daily.   torsemide (DEMADEX) 20 MG tablet Take 60 mg by mouth 2 (two) times  daily.   No facility-administered encounter medications on file as of 05/10/2022.    Review of Systems  Constitutional:  Negative for activity change, appetite change and unexpected weight change.  HENT: Negative.    Respiratory:  Positive for cough. Negative for shortness of breath.   Cardiovascular:  Negative for leg swelling.  Gastrointestinal:  Negative for constipation.  Genitourinary:  Positive for frequency.  Musculoskeletal:  Negative for arthralgias, gait problem and myalgias.  Skin: Negative.  Negative for rash.  Neurological:  Negative for dizziness and weakness.  Psychiatric/Behavioral:  Positive for confusion. Negative for sleep disturbance.   All other systems reviewed and are negative.   Immunization History  Administered Date(s) Administered   Fluad Quad(high Dose 65+) 04/27/2021   Influenza, High Dose Seasonal PF 04/15/2017, 05/16/2018   Influenza,inj,Quad PF,6+ Mos 04/23/2015   Moderna SARS-COV2 Booster Vaccination 12/28/2020, 04/20/2021   Moderna Sars-Covid-2 Vaccination 07/26/2019, 08/26/2019, 06/06/2020   Pneumococcal Conjugate-13 02/14/2016, 07/23/2016   Pneumococcal Polysaccharide-23 11/13/2012   Td 01/29/2006   Tdap 08/07/2018   Zoster Recombinat (Shingrix) 02/18/2019    Zoster, Live 02/18/2019, 07/01/2019   Pertinent  Health Maintenance Due  Topic Date Due   INFLUENZA VACCINE  02/20/2022      08/12/2018    3:27 PM 01/27/2019    7:08 AM 01/27/2019   10:15 AM 03/19/2022    1:03 PM 05/10/2022    3:46 PM  Fall Risk  Falls in the past year? '1   1 1  '$ Was there an injury with Fall? 1   0 0  Fall Risk Category Calculator '2   2 2  '$ Fall Risk Category Moderate   Moderate Moderate  Patient Fall Risk Level Moderate fall risk Moderate fall risk High fall risk Moderate fall risk Moderate fall risk  Patient at Risk for Falls Due to Impaired balance/gait   History of fall(s);Impaired balance/gait;Impaired mobility History of fall(s);Impaired balance/gait;Impaired mobility  Fall risk Follow up Falls prevention discussed   Falls evaluation completed;Falls prevention discussed;Education provided Falls evaluation completed   Functional Status Survey:    Vitals:   05/10/22 1537  BP: 118/80  Pulse: 65  Resp: 18  Temp: (!) 97.5 F (36.4 C)  SpO2: 98%  Weight: 143 lb 9.6 oz (65.1 kg)  Height: '5\' 6"'$  (1.676 m)   Body mass index is 23.18 kg/m. Physical Exam Vitals reviewed.  Constitutional:      Appearance: Normal appearance.  HENT:     Head: Normocephalic.     Nose: Nose normal.     Mouth/Throat:     Mouth: Mucous membranes are moist.     Pharynx: Oropharynx is clear.  Eyes:     Pupils: Pupils are equal, round, and reactive to light.  Cardiovascular:     Rate and Rhythm: Normal rate and regular rhythm.     Pulses: Normal pulses.     Heart sounds: Murmur heard.  Pulmonary:     Effort: Pulmonary effort is normal. No respiratory distress.     Breath sounds: Rales present.  Abdominal:     General: Abdomen is flat. Bowel sounds are normal.     Palpations: Abdomen is soft.  Musculoskeletal:        General: No swelling.     Cervical back: Neck supple.  Skin:    General: Skin is warm.  Neurological:     General: No focal deficit present.     Mental Status:  He is alert.     Comments: Has aphasia and was having difficult  time expressing himselves  Psychiatric:        Mood and Affect: Mood normal.        Thought Content: Thought content normal.     Labs reviewed: Recent Labs    09/14/21 1411 11/06/21 0000  NA 138 143  K 3.5 3.9  CL 99 106  CO2 29 31*  GLUCOSE 118*  --   BUN 28* 33*  CREATININE 1.50* 1.2  CALCIUM 8.4* 8.2*   Recent Labs    11/06/21 0000  AST 11*  ALT 5*  ALKPHOS 48  ALBUMIN 3.3*   Recent Labs    11/06/21 0000  WBC 7.1  HGB 12.7*  HCT 39*  PLT 308   Lab Results  Component Value Date   TSH 3.07 11/06/2021   No results found for: "HGBA1C" No results found for: "CHOL", "HDL", "LDLCALC", "LDLDIRECT", "TRIG", "CHOLHDL"  Significant Diagnostic Results in last 30 days:  No results found.  Assessment/Plan 1. Moderate dementia without behavioral disturbance, psychotic disturbance, mood disturbance, or anxiety, unspecified dementia type (HCC) On Aricept and Namenda Increase his namenda to 10 mg BID CT scan in 07/22 showed Chronic Vessel changes Worsening Aphasia 2. Recurrent pleural effusion Follows with Dr Nils Pyle Now tube drain emptying q weekly  3. Chest tube in place Emptying Q Weekly  4. Chronic diastolic CHF (congestive heart failure) (HCC) On Torsemide and Aldactone He has gained some weight and has rales on Exam with Cough Will repeat Chest Xray  5. Acquired hypothyroidism TSH normal in 04/23  6. Chronic gout without tophus, unspecified cause, unspecified site On Allopurinol    Family/ staff Communication:   Labs/tests ordered:  CBC,CMP,Chest Xray

## 2022-05-14 DIAGNOSIS — D649 Anemia, unspecified: Secondary | ICD-10-CM | POA: Diagnosis not present

## 2022-05-14 DIAGNOSIS — R059 Cough, unspecified: Secondary | ICD-10-CM | POA: Diagnosis not present

## 2022-05-14 DIAGNOSIS — E785 Hyperlipidemia, unspecified: Secondary | ICD-10-CM | POA: Diagnosis not present

## 2022-05-23 ENCOUNTER — Encounter: Payer: Self-pay | Admitting: Orthopedic Surgery

## 2022-05-23 ENCOUNTER — Non-Acute Institutional Stay: Payer: Medicare Other | Admitting: Orthopedic Surgery

## 2022-05-23 DIAGNOSIS — M6281 Muscle weakness (generalized): Secondary | ICD-10-CM | POA: Diagnosis not present

## 2022-05-23 DIAGNOSIS — N400 Enlarged prostate without lower urinary tract symptoms: Secondary | ICD-10-CM | POA: Diagnosis not present

## 2022-05-23 DIAGNOSIS — F03B0 Unspecified dementia, moderate, without behavioral disturbance, psychotic disturbance, mood disturbance, and anxiety: Secondary | ICD-10-CM

## 2022-05-23 DIAGNOSIS — I5032 Chronic diastolic (congestive) heart failure: Secondary | ICD-10-CM

## 2022-05-23 DIAGNOSIS — N39498 Other specified urinary incontinence: Secondary | ICD-10-CM | POA: Diagnosis not present

## 2022-05-23 DIAGNOSIS — M25561 Pain in right knee: Secondary | ICD-10-CM | POA: Diagnosis not present

## 2022-05-23 DIAGNOSIS — N3941 Urge incontinence: Secondary | ICD-10-CM | POA: Diagnosis not present

## 2022-05-23 DIAGNOSIS — Z9689 Presence of other specified functional implants: Secondary | ICD-10-CM | POA: Diagnosis not present

## 2022-05-23 DIAGNOSIS — R2689 Other abnormalities of gait and mobility: Secondary | ICD-10-CM | POA: Diagnosis not present

## 2022-05-23 DIAGNOSIS — R2681 Unsteadiness on feet: Secondary | ICD-10-CM | POA: Diagnosis not present

## 2022-05-23 DIAGNOSIS — J90 Pleural effusion, not elsewhere classified: Secondary | ICD-10-CM | POA: Diagnosis not present

## 2022-05-23 DIAGNOSIS — R41841 Cognitive communication deficit: Secondary | ICD-10-CM | POA: Diagnosis not present

## 2022-05-23 DIAGNOSIS — R1312 Dysphagia, oropharyngeal phase: Secondary | ICD-10-CM | POA: Diagnosis not present

## 2022-05-23 MED ORDER — MEMANTINE HCL 10 MG PO TABS: 10.0000 mg | ORAL_TABLET | Freq: Two times a day (BID) | ORAL | 0 refills | Status: DC

## 2022-05-23 NOTE — Progress Notes (Unsigned)
Location:  [facility] [facility] Room Number: 28/A Place of Service:  ALF [phone]) Provider:  Yvonna Alanis, NP   Virgie Dad, MD  Patient Care Team: Virgie Dad, MD as PCP - General (Internal Medicine) Bensimhon, Shaune Pascal, MD as PCP - Advanced Heart Failure (Cardiology) Wenda Low, MD as Consulting Physician (Internal Medicine)  Extended Emergency Contact Information Primary Emergency Contact: [place] Mobile Phone: [phone] Relation: Son Secondary Emergency Contact: [place],Carol Address: [address]           [address] Johnnette Litter of [employer] Phone: [phone] Relation: Spouse  Code Status:  DNR Goals of care: Advanced Directive information    05/10/2022    3:46 PM  Advanced Directives  Does Patient Have a Medical Advance Directive? Yes  Type of Paramedic of [place];Living will;Out of facility DNR (pink MOST or yellow form)  Does patient want to make changes to medical advance directive? No - Patient declined  Copy of [place] in Chart? Yes - validated most recent copy scanned in chart (See row information)     Chief Complaint  Patient presents with   Acute Visit    Urinary incontinence    HPI:  Pt is a 88 y.o. male seen today for acute visit due to increased urinary incontinence.   He currently resides on the assisted nursing unit at [facility]. PMH: aortic atherosclerosis, temporal arteritis, H/o TIA, pleural effusion, GERD, optic neuritis, yellow nail syndrome, fibroma of prostate, gout, hyponatremia, and unstable gait.   Nursing reports increased urinary incontinence for the past few weeks. He has been observed soiled in his clothes. Bedding also soiled when he awkanes in the morning. Family has provided briefs, but patient not compliant with use. Nursing staff also reports increased agitation when trying to promote brief use. Strong urine odor in room during  encounter. He reports urge to go during the day. He is unable to use urinal. Briefs located in closet and not bathroom. He denies dysuria, frequency, flank pain, or fever. He is on 3 different diuretics due to recurrent pleural effusion/CHF.   Dementia- MMSE 23/30, 01/2021 CT head noted chronic vessel changes, see above, doing well in AL, remains on Aricept and Namenda (increased 10/19) Pleural effusion- followed by Dr. 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, has pleurx drain, 10/19 CXR indicated mild pulmonary edema, remains on metolazone once week (wed) CHF- 03/13/2022 LVEF 55-60%, remains on torsemide and spirolactone BPH- no urinary issues, remains on Cardura  Past Medical History:  Diagnosis Date   Actinic keratoses    Arthritis    CHF (congestive heart failure) ([place])    Chronic bilateral pleural effusions    Chronic sinusitis    Colon polyps    Dyspnea 07/13/2013   BNP normal   Edema 07/13/2013   GERD (gastroesophageal reflux disease)    omeprazole 1-2 times daily   H/O TIA (transient ischemic attack) and stroke    on statins and plavix -saw Dr.Sethi in the past   Headache    History of BPH    History of TIA (transient ischemic attack) 07/13/2013   Left inguinal hernia    Left inguinal hernia 01/27/2019   Lumbar radiculopathy    Need for shingles vaccine    Optic neuritis 10/2015   left eye   Perennial allergic rhinitis    Polymyalgia rheumatica ([place]) 07/13/2013   Daily prednisone   Restless leg syndrome    Retinal disease  right eye since birth    Shingles    Temporal arteritis ([place]) 10/2015   Wears glasses    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   Past Surgical History:  Procedure Laterality Date   BRAVO [place] STUDY N/A 04/18/2016   Procedure: BRAVO [place];  Surgeon: Wilford Corner, MD;  [facility];  Service: Endoscopy;  Laterality: N/A;   CHEST TUBE INSERTION Right 02/13/2018   Procedure: INSERTION PLEURAL DRAINAGE CATHETER;  Surgeon: Ivin Poot, MD;   Location: [place];  Service: Thoracic;  Laterality: Right;   CHEST TUBE INSERTION Left 11/27/2019   Procedure: REDO INSERTION PLEURAL DRAINAGE CATHETER USING 15.5 FR. [place] CATH;  Surgeon: [name], Collier Salina, MD;  Location: [place];  Service: Thoracic;  Laterality: Left;   COLONOSCOPY     drropy eyelid surgery     ESOPHAGOGASTRODUODENOSCOPY (EGD) WITH PROPOFOL N/A 04/18/2016   Procedure: ESOPHAGOGASTRODUODENOSCOPY (EGD) WITH PROPOFOL;  Surgeon: Wilford Corner, MD;  [facility];  Service: Endoscopy;  Laterality: N/A;   INGUINAL HERNIA REPAIR Left 01/27/2019   Procedure: OPEN REPAIR LEFT INGUINAL HERNIA WITH MESH;  Surgeon: Fanny Skates, MD;  Location: [place];  Service: General;  Laterality: Left;  GENERAL AND TAP BLOCK ANESTHESIA   INSERTION OF MESH Left 01/27/2019   Procedure: Insertion Of Mesh;  Surgeon: Fanny Skates, MD;  Location: [place];  Service: General;  Laterality: Left;   IR THORACENTESIS ASP PLEURAL SPACE W/IMG GUIDE  02/04/2018   IR THORACENTESIS ASP PLEURAL SPACE W/IMG GUIDE  02/06/2018   REMOVAL OF PLEURAL DRAINAGE CATHETER Left 11/27/2019   Procedure: REMOVAL OF PLEURAL DRAINAGE CATHETER;  Surgeon: Ivin Poot, MD;  Location: [place];  Service: Thoracic;  Laterality: Left;   REMOVAL OF PLEURAL DRAINAGE CATHETER Left 03/31/2020   Procedure: REMOVAL OF PLEURAL DRAINAGE CATHETER;  Surgeon: Ivin Poot, MD;  Location: [place];  Service: Thoracic;  Laterality: Left;   RIGHT/LEFT HEART CATH AND CORONARY ANGIOGRAPHY N/A 06/24/2018   Procedure: RIGHT/LEFT HEART CATH AND CORONARY ANGIOGRAPHY;  Surgeon: Jolaine Artist, MD;  Location: [place] [facility];  Service: Cardiovascular;  Laterality: N/A;   VASECTOMY      No Known Allergies  Outpatient Encounter Medications as of 05/23/2022  Medication Sig   acetaminophen (TYLENOL) 325 MG tablet Take 650 mg by mouth 4 (four) times daily as needed.   allopurinol (ZYLOPRIM) 300 MG tablet Take 150 mg by mouth in the morning.   aspirin EC  81 MG tablet Take 81 mg by mouth daily.   donepezil (ARICEPT ODT) 10 MG disintegrating tablet Take 10 mg by mouth at bedtime.   doxazosin (CARDURA) 8 MG tablet Take 8 mg by mouth daily.   famotidine (PEPCID) 20 MG tablet Take 20 mg by mouth daily as needed for heartburn or indigestion.   levothyroxine (SYNTHROID) 25 MCG tablet Take 25 mcg by mouth daily.   memantine (NAMENDA) 5 MG tablet Take 1 tablet (5 mg total) by mouth 2 (two) times daily.   metolazone (ZAROXOLYN) 2.5 MG tablet Take 1 tablet (2.5 mg total) by mouth every Wednesday.   pantoprazole (PROTONIX) 20 MG tablet Take 20 mg by mouth daily.    potassium chloride SA (KLOR-CON M) 20 MEQ tablet Take 20 mEq by mouth daily.   potassium chloride SA (KLOR-CON M) 20 MEQ tablet Take 20 mEq by mouth daily. Give 2 tablets by mouth in the morning for mineral/supplement   simvastatin (ZOCOR) 20 MG tablet Take 20 mg by mouth daily.   spironolactone (  ALDACTONE) 25 MG tablet Take 0.5 tablets (12.5 mg total) by mouth daily.   torsemide (DEMADEX) 20 MG tablet Take 60 mg by mouth 2 (two) times daily.   No facility-administered encounter medications on file as of 05/23/2022.    Review of Systems  Immunization History  Administered Date(s) Administered   Fluad Quad(high Dose 65+) 04/27/2021   Influenza, High Dose Seasonal PF 04/15/2017, 05/16/2018   Influenza,inj,Quad PF,6+ Mos 04/23/2015   Moderna SARS-COV2 Booster Vaccination 12/28/2020, 04/20/2021   Moderna Sars-Covid-2 Vaccination 07/26/2019, 08/26/2019, 06/06/2020   Pneumococcal Conjugate-13 02/14/2016, 07/23/2016   Pneumococcal Polysaccharide-23 11/13/2012   Td 01/29/2006   Tdap 08/07/2018   Zoster Recombinat (Shingrix) 02/18/2019   Zoster, Live 02/18/2019, 07/01/2019   Pertinent  Health Maintenance Due  Topic Date Due   INFLUENZA VACCINE  02/20/2022      08/12/2018    3:27 PM 01/27/2019    7:08 AM 01/27/2019   10:15 AM 03/19/2022    1:03 PM 05/10/2022    3:46 PM  Fall Risk  Falls in  the past year? '1   1 1  '$ Was there an injury with Fall? 1   0 0  Fall Risk Category Calculator '2   2 2  '$ Fall Risk Category Moderate   Moderate Moderate  Patient Fall Risk Level Moderate fall risk Moderate fall risk High fall risk Moderate fall risk Moderate fall risk  Patient at Risk for Falls Due to Impaired balance/gait   History of fall(s);Impaired balance/gait;Impaired mobility History of fall(s);Impaired balance/gait;Impaired mobility  Fall risk Follow up Falls prevention discussed   Falls evaluation completed;Falls prevention discussed;Education provided Falls evaluation completed   Functional Status Survey:    Vitals:   05/23/22 1547  BP: 109/61  Pulse: 81  Resp: 14  Temp: 98.4 F (36.9 C)  SpO2: 95%  Weight: 137 lb 12.8 oz (62.5 kg)  Height: '5\' 6"'$  (1.676 m)   Body mass index is 22.24 kg/m. Physical Exam  Labs reviewed: Recent Labs    09/14/21 1411 11/06/21 0000  NA 138 143  K 3.5 3.9  CL 99 106  CO2 29 31*  GLUCOSE 118*  --   BUN 28* 33*  CREATININE 1.50* 1.2  CALCIUM 8.4* 8.2*   Recent Labs    11/06/21 0000  AST 11*  ALT 5*  ALKPHOS 48  ALBUMIN 3.3*   Recent Labs    11/06/21 0000  WBC 7.1  HGB 12.7*  HCT 39*  PLT 308   Lab Results  Component Value Date   TSH 3.07 11/06/2021   No results found for: "HGBA1C" No results found for: "CHOL", "HDL", "LDLCALC", "LDLDIRECT", "TRIG", "CHOLHDL"  Significant Diagnostic Results in last 30 days:  No results found.  Assessment/Plan There are no diagnoses linked to this encounter.   Family/ staff Communication: ***  Labs/tests ordered:  ***

## 2022-05-24 DIAGNOSIS — N39498 Other specified urinary incontinence: Secondary | ICD-10-CM | POA: Diagnosis not present

## 2022-05-24 DIAGNOSIS — R2681 Unsteadiness on feet: Secondary | ICD-10-CM | POA: Diagnosis not present

## 2022-05-24 DIAGNOSIS — R41841 Cognitive communication deficit: Secondary | ICD-10-CM | POA: Diagnosis not present

## 2022-05-24 DIAGNOSIS — M25561 Pain in right knee: Secondary | ICD-10-CM | POA: Diagnosis not present

## 2022-05-24 DIAGNOSIS — M6281 Muscle weakness (generalized): Secondary | ICD-10-CM | POA: Diagnosis not present

## 2022-05-24 DIAGNOSIS — R1312 Dysphagia, oropharyngeal phase: Secondary | ICD-10-CM | POA: Diagnosis not present

## 2022-05-30 DIAGNOSIS — M25561 Pain in right knee: Secondary | ICD-10-CM | POA: Diagnosis not present

## 2022-05-30 DIAGNOSIS — N39498 Other specified urinary incontinence: Secondary | ICD-10-CM | POA: Diagnosis not present

## 2022-05-30 DIAGNOSIS — M6281 Muscle weakness (generalized): Secondary | ICD-10-CM | POA: Diagnosis not present

## 2022-05-30 DIAGNOSIS — R41841 Cognitive communication deficit: Secondary | ICD-10-CM | POA: Diagnosis not present

## 2022-05-30 DIAGNOSIS — R1312 Dysphagia, oropharyngeal phase: Secondary | ICD-10-CM | POA: Diagnosis not present

## 2022-05-30 DIAGNOSIS — R2681 Unsteadiness on feet: Secondary | ICD-10-CM | POA: Diagnosis not present

## 2022-05-31 DIAGNOSIS — R2681 Unsteadiness on feet: Secondary | ICD-10-CM | POA: Diagnosis not present

## 2022-05-31 DIAGNOSIS — R41841 Cognitive communication deficit: Secondary | ICD-10-CM | POA: Diagnosis not present

## 2022-05-31 DIAGNOSIS — N39498 Other specified urinary incontinence: Secondary | ICD-10-CM | POA: Diagnosis not present

## 2022-05-31 DIAGNOSIS — M6281 Muscle weakness (generalized): Secondary | ICD-10-CM | POA: Diagnosis not present

## 2022-05-31 DIAGNOSIS — M25561 Pain in right knee: Secondary | ICD-10-CM | POA: Diagnosis not present

## 2022-05-31 DIAGNOSIS — R1312 Dysphagia, oropharyngeal phase: Secondary | ICD-10-CM | POA: Diagnosis not present

## 2022-06-04 DIAGNOSIS — R41841 Cognitive communication deficit: Secondary | ICD-10-CM | POA: Diagnosis not present

## 2022-06-04 DIAGNOSIS — R2681 Unsteadiness on feet: Secondary | ICD-10-CM | POA: Diagnosis not present

## 2022-06-04 DIAGNOSIS — N39498 Other specified urinary incontinence: Secondary | ICD-10-CM | POA: Diagnosis not present

## 2022-06-04 DIAGNOSIS — R1312 Dysphagia, oropharyngeal phase: Secondary | ICD-10-CM | POA: Diagnosis not present

## 2022-06-04 DIAGNOSIS — M6281 Muscle weakness (generalized): Secondary | ICD-10-CM | POA: Diagnosis not present

## 2022-06-04 DIAGNOSIS — M25561 Pain in right knee: Secondary | ICD-10-CM | POA: Diagnosis not present

## 2022-06-05 ENCOUNTER — Non-Acute Institutional Stay: Payer: Medicare Other | Admitting: Adult Health

## 2022-06-05 ENCOUNTER — Encounter: Payer: Self-pay | Admitting: Adult Health

## 2022-06-05 DIAGNOSIS — F03B0 Unspecified dementia, moderate, without behavioral disturbance, psychotic disturbance, mood disturbance, and anxiety: Secondary | ICD-10-CM | POA: Diagnosis not present

## 2022-06-05 DIAGNOSIS — M1A9XX0 Chronic gout, unspecified, without tophus (tophi): Secondary | ICD-10-CM | POA: Diagnosis not present

## 2022-06-05 DIAGNOSIS — M25561 Pain in right knee: Secondary | ICD-10-CM | POA: Diagnosis not present

## 2022-06-05 NOTE — Progress Notes (Unsigned)
Location:  [facility] Room Number: Al 28-A Place of Service:  ALF [phone]) Provider: Durenda Age NP   Virgie Dad, MD  Patient Care Team: Virgie Dad, MD as PCP - General (Internal Medicine) Bensimhon, Shaune Pascal, MD as PCP - Advanced Heart Failure (Cardiology) Wenda Low, MD as Consulting Physician (Internal Medicine)  Extended Emergency Contact Information Primary Emergency Contact: [place] Mobile Phone: [phone] Relation: Son Secondary Emergency Contact: Shadwick,Carol Address: [address]          [place] [address] Johnnette Litter of [place] Mobile Phone: [phone] Relation: Spouse  Code Status:DNR   Goals of care: Advanced Directive information    06/05/2022   12:36 PM  Advanced Directives  Does Patient Have a Medical Advance Directive? Yes  Type of Paramedic of [place];Living will;Out of facility DNR (pink MOST or yellow form)  Does patient want to make changes to medical advance directive? No - Patient declined  Copy of [place] in Chart? Yes - validated most recent copy scanned in chart (See row information)  Pre-existing out of facility DNR order (yellow form or pink MOST form) Pink Most/Yellow Form available - Physician notified to receive inpatient order;Yellow form placed in chart (order not valid for inpatient use)     Chief Complaint  Patient presents with   Acute Visit    Right knee pain    HPI:  Pt is a 88 y.o. male seen today for an acute visit for right knee pain. He has a PMH of CHF, TIA, BPH and gout. He complained of right knee pain which started 2 weeks ago.   Past Medical History:  Diagnosis Date   Actinic keratoses    Arthritis    CHF (congestive heart failure) (HCC)    Chronic bilateral pleural effusions    Chronic sinusitis    Colon polyps    Dyspnea 07/13/2013   BNP normal   Edema 07/13/2013   GERD (gastroesophageal reflux disease)     omeprazole 1-2 times daily   H/O TIA (transient ischemic attack) and stroke    on statins and plavix -saw Dr.Sethi in the past   Headache    History of BPH    History of TIA (transient ischemic attack) 07/13/2013   Left inguinal hernia    Left inguinal hernia 01/27/2019   Lumbar radiculopathy    Need for shingles vaccine    Optic neuritis 10/2015   left eye   Perennial allergic rhinitis    Polymyalgia rheumatica ([place]) 07/13/2013   Daily prednisone   Restless leg syndrome    Retinal disease    right eye since birth    Shingles    Temporal arteritis ([place]) 10/2015   Wears glasses    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   Past Surgical History:  Procedure Laterality Date   BRAVO [employer] STUDY N/A 04/18/2016   Procedure: BRAVO [place] STUDY;  Surgeon: Wilford Corner, MD;  [facility];  Service: Endoscopy;  Laterality: N/A;   CHEST TUBE INSERTION Right 02/13/2018   Procedure: INSERTION PLEURAL DRAINAGE CATHETER;  Surgeon: Ivin Poot, MD;  Location: [place];  Service: Thoracic;  Laterality: Right;   CHEST TUBE INSERTION Left 11/27/2019   Procedure: REDO INSERTION PLEURAL DRAINAGE CATHETER USING 15.5 FR. [place] CATH;  Surgeon: [name], Collier Salina, MD;  [facility]: [facility] OR;  Service: Thoracic;  Laterality: Left;   COLONOSCOPY     drropy eyelid surgery     ESOPHAGOGASTRODUODENOSCOPY (EGD)  WITH PROPOFOL N/A 04/18/2016   Procedure: ESOPHAGOGASTRODUODENOSCOPY (EGD) WITH PROPOFOL;  Surgeon: Wilford Corner, MD;  [facility];  Service: Endoscopy;  Laterality: N/A;   INGUINAL HERNIA REPAIR Left 01/27/2019   Procedure: OPEN REPAIR LEFT INGUINAL HERNIA WITH MESH;  Surgeon: Fanny Skates, MD;  Location: [place];  Service: General;  Laterality: Left;  GENERAL AND TAP BLOCK ANESTHESIA   INSERTION OF MESH Left 01/27/2019   Procedure: Insertion Of Mesh;  Surgeon: Fanny Skates, MD;  Location: [place];  Service: General;  Laterality: Left;   IR THORACENTESIS ASP PLEURAL SPACE W/IMG GUIDE  02/04/2018   IR  THORACENTESIS ASP PLEURAL SPACE W/IMG GUIDE  02/06/2018   REMOVAL OF PLEURAL DRAINAGE CATHETER Left 11/27/2019   Procedure: REMOVAL OF PLEURAL DRAINAGE CATHETER;  Surgeon: Ivin Poot, MD;  Location: [place];  Service: Thoracic;  Laterality: Left;   REMOVAL OF PLEURAL DRAINAGE CATHETER Left 03/31/2020   Procedure: REMOVAL OF PLEURAL DRAINAGE CATHETER;  Surgeon: Ivin Poot, MD;  Location: [facility];  Service: Thoracic;  Laterality: Left;   RIGHT/LEFT HEART CATH AND CORONARY ANGIOGRAPHY N/A 06/24/2018   Procedure: RIGHT/LEFT HEART CATH AND CORONARY ANGIOGRAPHY;  Surgeon: Jolaine Artist, MD;  Location: [facility] LAB;  Service: Cardiovascular;  Laterality: N/A;   VASECTOMY      No Known Allergies  Allergies as of 06/05/2022   No Known Allergies      Medication List        Accurate as of June 05, 2022  2:53 PM. If you have any questions, ask your nurse or doctor.          acetaminophen 325 MG tablet Commonly known as: TYLENOL Take 650 mg by mouth every 6 (six) hours as needed.   allopurinol 300 MG tablet Commonly known as: ZYLOPRIM Take 150 mg by mouth in the morning. For gout   aspirin 81 MG chewable tablet Chew 81 mg by mouth daily. Chew and swallow tablet What changed: Another medication with the same name was removed. Continue taking this medication, and follow the directions you see here. Changed by: Durenda Age, NP   donepezil 10 MG disintegrating tablet Commonly known as: ARICEPT ODT Take 10 mg by mouth at bedtime. For co++ +   doxazosin 8 MG tablet Commonly known as: CARDURA Take 8 mg by mouth daily.   famotidine 20 MG tablet Commonly known as: PEPCID Take 20 mg by mouth daily as needed for heartburn or indigestion.   levothyroxine 25 MCG tablet Commonly known as: SYNTHROID Take 25 mcg by mouth daily.   memantine 10 MG tablet Commonly known as: NAMENDA Take 1 tablet (10 mg total) by mouth 2 (two) times daily.   metolazone 2.5 MG  tablet Commonly known as: ZAROXOLYN Take 1 tablet (2.5 mg total) by mouth every Wednesday.   pantoprazole 20 MG tablet Commonly known as: PROTONIX Take 20 mg by mouth daily.   potassium chloride SA 20 MEQ tablet Commonly known as: KLOR-CON M Take 20 mEq by mouth daily.   potassium chloride SA 20 MEQ tablet Commonly known as: KLOR-CON M Take 20 mEq by mouth daily. Give 2 tablets by mouth in the morning for mineral/supplement   simvastatin 20 MG tablet Commonly known as: ZOCOR Take 20 mg by mouth daily.   spironolactone 25 MG tablet Commonly known as: ALDACTONE Take 0.5 tablets (12.5 mg total) by mouth daily.   torsemide 20 MG tablet Commonly known as: DEMADEX Take 60 mg by mouth 2 (two) times daily.  3 Tablets        Review of Systems  Immunization History  Administered Date(s) Administered   Fluad Quad(high Dose 65+) 04/27/2021   Influenza, High Dose Seasonal PF 04/15/2017, 05/16/2018   Influenza,inj,Quad PF,6+ Mos 04/23/2015   Moderna SARS-COV2 Booster Vaccination 12/28/2020, 04/20/2021   Moderna Sars-Covid-2 Vaccination 07/26/2019, 08/26/2019, 06/06/2020   Pneumococcal Conjugate-13 02/14/2016, 07/23/2016   Pneumococcal Polysaccharide-23 11/13/2012   Td 01/29/2006   Tdap 08/07/2018   Zoster Recombinat (Shingrix) 02/18/2019   Zoster, Live 02/18/2019, 07/01/2019   Pertinent  Health Maintenance Due  Topic Date Due   INFLUENZA VACCINE  02/20/2022      08/12/2018    3:27 PM 01/27/2019    7:08 AM 01/27/2019   10:15 AM 03/19/2022    1:03 PM 05/10/2022    3:46 PM  Fall Risk  Falls in the past year? '1   1 1  '$ Was there an injury with Fall? 1   0 0  Fall Risk Category Calculator '2   2 2  '$ Fall Risk Category Moderate   Moderate Moderate  Patient Fall Risk Level Moderate fall risk Moderate fall risk High fall risk Moderate fall risk Moderate fall risk  Patient at Risk for Falls Due to Impaired balance/gait   History of fall(s);Impaired balance/gait;Impaired mobility  History of fall(s);Impaired balance/gait;Impaired mobility  Fall risk Follow up Falls prevention discussed   Falls evaluation completed;Falls prevention discussed;Education provided Falls evaluation completed   Functional Status Survey:    Vitals:   06/05/22 1147  BP: 107/76  Pulse: 89  Resp: 16  Temp: 97.7 F (36.5 C)  SpO2: 94%  Weight: 137 lb 12.8 oz (62.5 kg)  Height: '5\' 6"'$  (1.676 m)   Body mass index is 22.24 kg/m. Physical Exam  Labs reviewed: Recent Labs    09/14/21 1411 11/06/21 0000  NA 138 143  K 3.5 3.9  CL 99 106  CO2 29 31*  GLUCOSE 118*  --   BUN 28* 33*  CREATININE 1.50* 1.2  CALCIUM 8.4* 8.2*   Recent Labs    11/06/21 0000  AST 11*  ALT 5*  ALKPHOS 48  ALBUMIN 3.3*   Recent Labs    11/06/21 0000  WBC 7.1  HGB 12.7*  HCT 39*  PLT 308   Lab Results  Component Value Date   TSH 3.07 11/06/2021   No results found for: "HGBA1C" No results found for: "CHOL", "HDL", "LDLCALC", "LDLDIRECT", "TRIG", "CHOLHDL"  Significant Diagnostic Results in last 30 days:  No results found.  Assessment/Plan There are no diagnoses linked to this encounter.   Family/ staff Communication:   Labs/tests ordered:

## 2022-06-06 ENCOUNTER — Other Ambulatory Visit: Payer: Self-pay

## 2022-06-06 DIAGNOSIS — R1312 Dysphagia, oropharyngeal phase: Secondary | ICD-10-CM | POA: Diagnosis not present

## 2022-06-06 DIAGNOSIS — R41841 Cognitive communication deficit: Secondary | ICD-10-CM | POA: Diagnosis not present

## 2022-06-06 DIAGNOSIS — M6281 Muscle weakness (generalized): Secondary | ICD-10-CM | POA: Diagnosis not present

## 2022-06-06 DIAGNOSIS — M25561 Pain in right knee: Secondary | ICD-10-CM | POA: Diagnosis not present

## 2022-06-06 DIAGNOSIS — N39498 Other specified urinary incontinence: Secondary | ICD-10-CM | POA: Diagnosis not present

## 2022-06-06 DIAGNOSIS — R2681 Unsteadiness on feet: Secondary | ICD-10-CM | POA: Diagnosis not present

## 2022-06-06 NOTE — Telephone Encounter (Signed)
Patient has request refill on medications Donepezil '10mg'$ , and Spironolactone '25mg'$  through [facility]. Medications have High Risk Warnings. Medications pend and sent to PCP Virgie Dad, MD for approval.

## 2022-06-07 DIAGNOSIS — R1312 Dysphagia, oropharyngeal phase: Secondary | ICD-10-CM | POA: Diagnosis not present

## 2022-06-07 DIAGNOSIS — M25561 Pain in right knee: Secondary | ICD-10-CM | POA: Diagnosis not present

## 2022-06-07 DIAGNOSIS — R2681 Unsteadiness on feet: Secondary | ICD-10-CM | POA: Diagnosis not present

## 2022-06-07 DIAGNOSIS — M6281 Muscle weakness (generalized): Secondary | ICD-10-CM | POA: Diagnosis not present

## 2022-06-07 DIAGNOSIS — R41841 Cognitive communication deficit: Secondary | ICD-10-CM | POA: Diagnosis not present

## 2022-06-07 DIAGNOSIS — N39498 Other specified urinary incontinence: Secondary | ICD-10-CM | POA: Diagnosis not present

## 2022-06-07 MED ORDER — DONEPEZIL HCL 10 MG PO TBDP: 10.0000 mg | ORAL_TABLET | Freq: Every day | ORAL | 1 refills | Status: DC

## 2022-06-07 MED ORDER — SPIRONOLACTONE 25 MG PO TABS: 12.5000 mg | ORAL_TABLET | Freq: Every day | ORAL | 1 refills | Status: DC

## 2022-06-10 NOTE — Progress Notes (Incomplete)
Location:  [facility] Room Number: Al 28-A Place of Service:  ALF [phone]) Provider: Durenda Age NP   Virgie Dad, MD  Patient Care Team: Virgie Dad, MD as PCP - General (Internal Medicine) Bensimhon, Shaune Pascal, MD as PCP - Advanced Heart Failure (Cardiology) Wenda Low, MD as Consulting Physician (Internal Medicine)  Extended Emergency Contact Information Primary Emergency Contact: [place] Mobile Phone: [phone] Relation: Son Secondary Emergency Contact: Malerba,Carol Address: [address]          [place] [address] Johnnette Litter of [place] Mobile Phone: [phone] Relation: Spouse  Code Status:DNR   Goals of care: Advanced Directive information    06/05/2022   12:36 PM  Advanced Directives  Does Patient Have a Medical Advance Directive? Yes  Type of Paramedic of [place];Living will;Out of facility DNR (pink MOST or yellow form)  Does patient want to make changes to medical advance directive? No - Patient declined  Copy of [place] in Chart? Yes - validated most recent copy scanned in chart (See row information)  Pre-existing out of facility DNR order (yellow form or pink MOST form) Pink Most/Yellow Form available - Physician notified to receive inpatient order;Yellow form placed in chart (order not valid for inpatient use)     Chief Complaint  Patient presents with  . Acute Visit    Right knee pain    HPI:  Pt is a 88 y.o. male seen today for an acute visit for right knee pain. He has a PMH of CHF, TIA, BPH and gout. He complained of right knee pain which started 2 weeks ago.   Past Medical History:  Diagnosis Date  . Actinic keratoses   . Arthritis   . CHF (congestive heart failure) ([place])   . Chronic bilateral pleural effusions   . Chronic sinusitis   . Colon polyps   . Dyspnea 07/13/2013   BNP normal  . Edema 07/13/2013  . GERD (gastroesophageal reflux  disease)    omeprazole 1-2 times daily  . H/O TIA (transient ischemic attack) and stroke    on statins and plavix -saw Dr.Sethi in the past  . Headache   . History of BPH   . History of TIA (transient ischemic attack) 07/13/2013  . Left inguinal hernia   . Left inguinal hernia 01/27/2019  . Lumbar radiculopathy   . Need for shingles vaccine   . Optic neuritis 10/2015   left eye  . Perennial allergic rhinitis   . Polymyalgia rheumatica ([place]) 07/13/2013   Daily prednisone  . Restless leg syndrome   . Retinal disease    right eye since birth   . Shingles   . Temporal arteritis ([place]) 10/2015  . Wears glasses   . 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 BRAVO [place] STUDY N/A 04/18/2016   Procedure: BRAVO [place];  Surgeon: Wilford Corner, MD;  [facility];  Service: Endoscopy;  Laterality: N/A;  . CHEST TUBE INSERTION Right 02/13/2018   Procedure: INSERTION PLEURAL DRAINAGE CATHETER;  Surgeon: Ivin Poot, MD;  Location: [place];  Service: Thoracic;  Laterality: Right;  . CHEST TUBE INSERTION Left 11/27/2019   Procedure: REDO INSERTION PLEURAL DRAINAGE CATHETER USING 15.5 FR. [place] CATH;  Surgeon: [name], Collier Salina, MD;  Location: [place];  Service: Thoracic;  Laterality: Left;  . COLONOSCOPY    . drropy eyelid surgery    . ESOPHAGOGASTRODUODENOSCOPY (EGD)  WITH PROPOFOL N/A 04/18/2016   Procedure: ESOPHAGOGASTRODUODENOSCOPY (EGD) WITH PROPOFOL;  Surgeon: Wilford Corner, MD;  [facility];  Service: Endoscopy;  Laterality: N/A;  . INGUINAL HERNIA REPAIR Left 01/27/2019   Procedure: OPEN REPAIR LEFT INGUINAL HERNIA WITH MESH;  Surgeon: Fanny Skates, MD;  Location: [place];  Service: General;  Laterality: Left;  GENERAL AND TAP BLOCK ANESTHESIA  . INSERTION OF MESH Left 01/27/2019   Procedure: Insertion Of Mesh;  Surgeon: Fanny Skates, MD;  Location: [facility];  Service: General;  Laterality: Left;  . IR THORACENTESIS ASP PLEURAL  SPACE W/IMG GUIDE  02/04/2018  . IR THORACENTESIS ASP PLEURAL SPACE W/IMG GUIDE  02/06/2018  . REMOVAL OF PLEURAL DRAINAGE CATHETER Left 11/27/2019   Procedure: REMOVAL OF PLEURAL DRAINAGE CATHETER;  Surgeon: Ivin Poot, MD;  Location: [facility];  Service: Thoracic;  Laterality: Left;  . REMOVAL OF PLEURAL DRAINAGE CATHETER Left 03/31/2020   Procedure: REMOVAL OF PLEURAL DRAINAGE CATHETER;  Surgeon: Ivin Poot, MD;  Location: [place];  Service: Thoracic;  Laterality: Left;  . RIGHT/LEFT HEART CATH AND CORONARY ANGIOGRAPHY N/A 06/24/2018   Procedure: RIGHT/LEFT HEART CATH AND CORONARY ANGIOGRAPHY;  Surgeon: Jolaine Artist, MD;  Location: [facility];  Service: Cardiovascular;  Laterality: N/A;  . VASECTOMY      No Known Allergies  Allergies as of 06/05/2022   No Known Allergies      Medication List        Accurate as of June 05, 2022  2:53 PM. If you have any questions, ask your nurse or doctor.          acetaminophen 325 MG tablet Commonly known as: TYLENOL Take 650 mg by mouth every 6 (six) hours as needed.   allopurinol 300 MG tablet Commonly known as: ZYLOPRIM Take 150 mg by mouth in the morning. For gout   aspirin 81 MG chewable tablet Chew 81 mg by mouth daily. Chew and swallow tablet What changed: Another medication with the same name was removed. Continue taking this medication, and follow the directions you see here. Changed by: Durenda Age, NP   donepezil 10 MG disintegrating tablet Commonly known as: ARICEPT ODT Take 10 mg by mouth at bedtime. For co++ +   doxazosin 8 MG tablet Commonly known as: CARDURA Take 8 mg by mouth daily.   famotidine 20 MG tablet Commonly known as: PEPCID Take 20 mg by mouth daily as needed for heartburn or indigestion.   levothyroxine 25 MCG tablet Commonly known as: SYNTHROID Take 25 mcg by mouth daily.   memantine 10 MG tablet Commonly known as: NAMENDA Take 1 tablet (10 mg total) by mouth 2  (two) times daily.   metolazone 2.5 MG tablet Commonly known as: ZAROXOLYN Take 1 tablet (2.5 mg total) by mouth every Wednesday.   pantoprazole 20 MG tablet Commonly known as: PROTONIX Take 20 mg by mouth daily.   potassium chloride SA 20 MEQ tablet Commonly known as: KLOR-CON M Take 20 mEq by mouth daily.   potassium chloride SA 20 MEQ tablet Commonly known as: KLOR-CON M Take 20 mEq by mouth daily. Give 2 tablets by mouth in the morning for mineral/supplement   simvastatin 20 MG tablet Commonly known as: ZOCOR Take 20 mg by mouth daily.   spironolactone 25 MG tablet Commonly known as: ALDACTONE Take 0.5 tablets (12.5 mg total) by mouth daily.   torsemide 20 MG tablet Commonly known as: DEMADEX Take 60 mg by mouth 2 (two) times daily.  3 Tablets        Review of Systems  Immunization History  Administered Date(s) Administered  . Fluad Quad(high Dose 65+) 04/27/2021  . Influenza, High Dose Seasonal PF 04/15/2017, 05/16/2018  . Influenza,inj,Quad PF,6+ Mos 04/23/2015  . Moderna SARS-COV2 Booster Vaccination 12/28/2020, 04/20/2021  . Moderna Sars-Covid-2 Vaccination 07/26/2019, 08/26/2019, 06/06/2020  . Pneumococcal Conjugate-13 02/14/2016, 07/23/2016  . Pneumococcal Polysaccharide-23 11/13/2012  . Td 01/29/2006  . Tdap 08/07/2018  . Zoster Recombinat (Shingrix) 02/18/2019  . Zoster, Live 02/18/2019, 07/01/2019   Pertinent  Health Maintenance Due  Topic Date Due  . INFLUENZA VACCINE  02/20/2022      08/12/2018    3:27 PM 01/27/2019    7:08 AM 01/27/2019   10:15 AM 03/19/2022    1:03 PM 05/10/2022    3:46 PM  Fall Risk  Falls in the past year? '1   1 1  '$ Was there an injury with Fall? 1   0 0  Fall Risk Category Calculator '2   2 2  '$ Fall Risk Category Moderate   Moderate Moderate  Patient Fall Risk Level Moderate fall risk Moderate fall risk High fall risk Moderate fall risk Moderate fall risk  Patient at Risk for Falls Due to Impaired balance/gait   History of  fall(s);Impaired balance/gait;Impaired mobility History of fall(s);Impaired balance/gait;Impaired mobility  Fall risk Follow up Falls prevention discussed   Falls evaluation completed;Falls prevention discussed;Education provided Falls evaluation completed   Functional Status Survey:    Vitals:   06/05/22 1147  BP: 107/76  Pulse: 89  Resp: 16  Temp: 97.7 F (36.5 C)  SpO2: 94%  Weight: 137 lb 12.8 oz (62.5 kg)  Height: '5\' 6"'$  (1.676 m)   Body mass index is 22.24 kg/m. Physical Exam  Labs reviewed: Recent Labs    09/14/21 1411 11/06/21 0000  NA 138 143  K 3.5 3.9  CL 99 106  CO2 29 31*  GLUCOSE 118*  --   BUN 28* 33*  CREATININE 1.50* 1.2  CALCIUM 8.4* 8.2*   Recent Labs    11/06/21 0000  AST 11*  ALT 5*  ALKPHOS 48  ALBUMIN 3.3*   Recent Labs    11/06/21 0000  WBC 7.1  HGB 12.7*  HCT 39*  PLT 308   Lab Results  Component Value Date   TSH 3.07 11/06/2021   No results found for: "HGBA1C" No results found for: "CHOL", "HDL", "LDLCALC", "LDLDIRECT", "TRIG", "CHOLHDL"  Significant Diagnostic Results in last 30 days:  No results found.  Assessment/Plan There are no diagnoses linked to this encounter.   Family/ staff Communication:   Labs/tests ordered:

## 2022-06-11 ENCOUNTER — Ambulatory Visit (INDEPENDENT_AMBULATORY_CARE_PROVIDER_SITE_OTHER): Payer: Medicare Other | Admitting: Cardiothoracic Surgery

## 2022-06-11 DIAGNOSIS — M6281 Muscle weakness (generalized): Secondary | ICD-10-CM | POA: Diagnosis not present

## 2022-06-11 DIAGNOSIS — R1312 Dysphagia, oropharyngeal phase: Secondary | ICD-10-CM | POA: Diagnosis not present

## 2022-06-11 DIAGNOSIS — J90 Pleural effusion, not elsewhere classified: Secondary | ICD-10-CM

## 2022-06-11 DIAGNOSIS — N39498 Other specified urinary incontinence: Secondary | ICD-10-CM | POA: Diagnosis not present

## 2022-06-11 DIAGNOSIS — R41841 Cognitive communication deficit: Secondary | ICD-10-CM | POA: Diagnosis not present

## 2022-06-11 DIAGNOSIS — M25561 Pain in right knee: Secondary | ICD-10-CM | POA: Diagnosis not present

## 2022-06-11 DIAGNOSIS — R2681 Unsteadiness on feet: Secondary | ICD-10-CM | POA: Diagnosis not present

## 2022-06-11 NOTE — Progress Notes (Signed)
Patient ID: Stuart Watson, male   DOB: 1931-09-17, 88 y.o.   MRN: [id number]      [address]       [place] [address]             [phone]     CARDIOTHORACIC SURGERY TELEPHONE VIRTUAL OFFICE NOTE  Referring Provider is Wenda Low, MD Primary Cardiologist is None PCP is Stuart Dad, MD   HPI:  I spoke with Stuart Watson (DOB Dec 02, 1931 ) via telephone on 06/11/2022 at 4:50 PM and verified that I was speaking with the correct person using more than one form of identification.  We discussed the reason(s) for conducting our visit virtually instead of in-person.  The patient expressed understanding the circumstances and agreed to proceed as described.  The phone visit was to review functioning right Pleurx catheter which is draining a recurrent malignant right pleural effusion.  At the last visit 8 weeks ago his drainage schedule of the Pleurx catheter was changed from twice a week to once a week every Monday.  Since then he has had minimal output with the last 4 weeks being 200 cc, 150 cc, 100 cc, 100 cc. The patient has had no associated shortness of breath or chest discomfort as the drainage output has decreased, making it unlikely that fluid is accumulating in his chest.  The plan will be for [facility] RN at [facility] to drain his Pleurx every Monday on a weekly basis.  If the output drops less than 50 cc for 2 consecutive weeks then the drainage schedule can cut back to every other week.  We will check back with the patient with a phone visit in 2 months after the holidays.  Patient is currently being evaluated for right knee pain which came on fairly suddenly over a couple weeks.   Current Outpatient Medications  Medication Sig Dispense Refill   acetaminophen (TYLENOL) 325 MG tablet Take 650 mg by mouth every 6 (six) hours as needed.     allopurinol (ZYLOPRIM) 300 MG tablet Take 150 mg by mouth in the morning. For gout     aspirin 81 MG chewable tablet  Chew 81 mg by mouth daily. Chew and swallow tablet     donepezil (ARICEPT ODT) 10 MG disintegrating tablet Take 1 tablet (10 mg total) by mouth at bedtime. For co++ + 90 tablet 1   doxazosin (CARDURA) 8 MG tablet Take 8 mg by mouth daily.     famotidine (PEPCID) 20 MG tablet Take 20 mg by mouth daily as needed for heartburn or indigestion.     levothyroxine (SYNTHROID) 25 MCG tablet Take 25 mcg by mouth daily.     memantine (NAMENDA) 10 MG tablet Take 1 tablet (10 mg total) by mouth 2 (two) times daily. 60 tablet 0   metolazone (ZAROXOLYN) 2.5 MG tablet Take 1 tablet (2.5 mg total) by mouth every Wednesday. 20 tablet 3   pantoprazole (PROTONIX) 20 MG tablet Take 20 mg by mouth daily.      potassium chloride SA (KLOR-CON M) 20 MEQ tablet Take 20 mEq by mouth daily.     potassium chloride SA (KLOR-CON M) 20 MEQ tablet Take 20 mEq by mouth daily. Give 2 tablets by mouth in the morning for mineral/supplement     simvastatin (ZOCOR) 20 MG tablet Take 20 mg by mouth daily.     spironolactone (ALDACTONE) 25 MG tablet Take 0.5 tablets (12.5 mg total) by mouth daily. 45 tablet 1  torsemide (DEMADEX) 20 MG tablet Take 60 mg by mouth 2 (two) times daily. 3 Tablets     No current facility-administered medications for this visit.     Diagnostic Tests:  None n/a   Impression:  Recurrent benign right pleural effusion drained with a Pleurx catheter functioning well  Plan:  Continue weekly drainage of the Pleurx catheter by the facility RN as noted above.    I discussed limitations of evaluation and management via telephone.  The patient was advised to call back for repeat telephone consultation or to seek an in-person evaluation if questions arise or the patient's clinical condition changes in any significant manner.  I spent in excess of 5 minutes of non-face-to-face time during the conduct of this telephone virtual office consultation.  Level 1  (99441)             5-10 minutes Level 2   (99442)            11-20 minutes Level 3  (99443)            21-30 minutes   Reschedule phone visit for 8 weeks. 06/11/2022 4:50 PM

## 2022-06-12 DIAGNOSIS — N39498 Other specified urinary incontinence: Secondary | ICD-10-CM | POA: Diagnosis not present

## 2022-06-12 DIAGNOSIS — R1312 Dysphagia, oropharyngeal phase: Secondary | ICD-10-CM | POA: Diagnosis not present

## 2022-06-12 DIAGNOSIS — M6281 Muscle weakness (generalized): Secondary | ICD-10-CM | POA: Diagnosis not present

## 2022-06-12 DIAGNOSIS — M25561 Pain in right knee: Secondary | ICD-10-CM | POA: Diagnosis not present

## 2022-06-12 DIAGNOSIS — M25551 Pain in right hip: Secondary | ICD-10-CM | POA: Diagnosis not present

## 2022-06-12 DIAGNOSIS — R41841 Cognitive communication deficit: Secondary | ICD-10-CM | POA: Diagnosis not present

## 2022-06-12 DIAGNOSIS — R2681 Unsteadiness on feet: Secondary | ICD-10-CM | POA: Diagnosis not present

## 2022-06-13 DIAGNOSIS — N39498 Other specified urinary incontinence: Secondary | ICD-10-CM | POA: Diagnosis not present

## 2022-06-13 DIAGNOSIS — R2681 Unsteadiness on feet: Secondary | ICD-10-CM | POA: Diagnosis not present

## 2022-06-13 DIAGNOSIS — M6281 Muscle weakness (generalized): Secondary | ICD-10-CM | POA: Diagnosis not present

## 2022-06-13 DIAGNOSIS — R1312 Dysphagia, oropharyngeal phase: Secondary | ICD-10-CM | POA: Diagnosis not present

## 2022-06-13 DIAGNOSIS — R41841 Cognitive communication deficit: Secondary | ICD-10-CM | POA: Diagnosis not present

## 2022-06-13 DIAGNOSIS — M25561 Pain in right knee: Secondary | ICD-10-CM | POA: Diagnosis not present

## 2022-06-15 DIAGNOSIS — R2681 Unsteadiness on feet: Secondary | ICD-10-CM | POA: Diagnosis not present

## 2022-06-15 DIAGNOSIS — M6281 Muscle weakness (generalized): Secondary | ICD-10-CM | POA: Diagnosis not present

## 2022-06-15 DIAGNOSIS — R1312 Dysphagia, oropharyngeal phase: Secondary | ICD-10-CM | POA: Diagnosis not present

## 2022-06-15 DIAGNOSIS — N39498 Other specified urinary incontinence: Secondary | ICD-10-CM | POA: Diagnosis not present

## 2022-06-15 DIAGNOSIS — M25561 Pain in right knee: Secondary | ICD-10-CM | POA: Diagnosis not present

## 2022-06-15 DIAGNOSIS — R41841 Cognitive communication deficit: Secondary | ICD-10-CM | POA: Diagnosis not present

## 2022-06-18 DIAGNOSIS — M25561 Pain in right knee: Secondary | ICD-10-CM | POA: Diagnosis not present

## 2022-06-18 DIAGNOSIS — N39498 Other specified urinary incontinence: Secondary | ICD-10-CM | POA: Diagnosis not present

## 2022-06-18 DIAGNOSIS — J918 Pleural effusion in other conditions classified elsewhere: Secondary | ICD-10-CM | POA: Diagnosis not present

## 2022-06-18 DIAGNOSIS — R41841 Cognitive communication deficit: Secondary | ICD-10-CM | POA: Diagnosis not present

## 2022-06-18 DIAGNOSIS — R2681 Unsteadiness on feet: Secondary | ICD-10-CM | POA: Diagnosis not present

## 2022-06-18 DIAGNOSIS — R1312 Dysphagia, oropharyngeal phase: Secondary | ICD-10-CM | POA: Diagnosis not present

## 2022-06-18 DIAGNOSIS — M6281 Muscle weakness (generalized): Secondary | ICD-10-CM | POA: Diagnosis not present

## 2022-06-19 DIAGNOSIS — M25561 Pain in right knee: Secondary | ICD-10-CM | POA: Diagnosis not present

## 2022-06-19 DIAGNOSIS — R41841 Cognitive communication deficit: Secondary | ICD-10-CM | POA: Diagnosis not present

## 2022-06-19 DIAGNOSIS — R1312 Dysphagia, oropharyngeal phase: Secondary | ICD-10-CM | POA: Diagnosis not present

## 2022-06-19 DIAGNOSIS — N39498 Other specified urinary incontinence: Secondary | ICD-10-CM | POA: Diagnosis not present

## 2022-06-19 DIAGNOSIS — M6281 Muscle weakness (generalized): Secondary | ICD-10-CM | POA: Diagnosis not present

## 2022-06-19 DIAGNOSIS — R2681 Unsteadiness on feet: Secondary | ICD-10-CM | POA: Diagnosis not present

## 2022-06-20 DIAGNOSIS — N39498 Other specified urinary incontinence: Secondary | ICD-10-CM | POA: Diagnosis not present

## 2022-06-20 DIAGNOSIS — R41841 Cognitive communication deficit: Secondary | ICD-10-CM | POA: Diagnosis not present

## 2022-06-20 DIAGNOSIS — R1312 Dysphagia, oropharyngeal phase: Secondary | ICD-10-CM | POA: Diagnosis not present

## 2022-06-20 DIAGNOSIS — R2681 Unsteadiness on feet: Secondary | ICD-10-CM | POA: Diagnosis not present

## 2022-06-20 DIAGNOSIS — M25561 Pain in right knee: Secondary | ICD-10-CM | POA: Diagnosis not present

## 2022-06-20 DIAGNOSIS — M6281 Muscle weakness (generalized): Secondary | ICD-10-CM | POA: Diagnosis not present

## 2022-06-21 DIAGNOSIS — R2681 Unsteadiness on feet: Secondary | ICD-10-CM | POA: Diagnosis not present

## 2022-06-21 DIAGNOSIS — N39498 Other specified urinary incontinence: Secondary | ICD-10-CM | POA: Diagnosis not present

## 2022-06-21 DIAGNOSIS — M25561 Pain in right knee: Secondary | ICD-10-CM | POA: Diagnosis not present

## 2022-06-21 DIAGNOSIS — R1312 Dysphagia, oropharyngeal phase: Secondary | ICD-10-CM | POA: Diagnosis not present

## 2022-06-21 DIAGNOSIS — R41841 Cognitive communication deficit: Secondary | ICD-10-CM | POA: Diagnosis not present

## 2022-06-21 DIAGNOSIS — M6281 Muscle weakness (generalized): Secondary | ICD-10-CM | POA: Diagnosis not present

## 2022-06-22 DIAGNOSIS — M6281 Muscle weakness (generalized): Secondary | ICD-10-CM | POA: Diagnosis not present

## 2022-06-22 DIAGNOSIS — R2689 Other abnormalities of gait and mobility: Secondary | ICD-10-CM | POA: Diagnosis not present

## 2022-06-22 DIAGNOSIS — R41841 Cognitive communication deficit: Secondary | ICD-10-CM | POA: Diagnosis not present

## 2022-06-22 DIAGNOSIS — R2681 Unsteadiness on feet: Secondary | ICD-10-CM | POA: Diagnosis not present

## 2022-06-22 DIAGNOSIS — N39498 Other specified urinary incontinence: Secondary | ICD-10-CM | POA: Diagnosis not present

## 2022-06-22 DIAGNOSIS — R1312 Dysphagia, oropharyngeal phase: Secondary | ICD-10-CM | POA: Diagnosis not present

## 2022-06-22 DIAGNOSIS — M25561 Pain in right knee: Secondary | ICD-10-CM | POA: Diagnosis not present

## 2022-06-23 ENCOUNTER — Other Ambulatory Visit (HOSPITAL_COMMUNITY): Payer: Self-pay | Admitting: Internal Medicine

## 2022-06-25 ENCOUNTER — Emergency Department (HOSPITAL_COMMUNITY): Payer: Medicare Other

## 2022-06-25 ENCOUNTER — Non-Acute Institutional Stay: Payer: Medicare Other | Admitting: Orthopedic Surgery

## 2022-06-25 ENCOUNTER — Emergency Department (HOSPITAL_COMMUNITY)
Admission: EM | Admit: 2022-06-25 | Discharge: 2022-06-25 | Disposition: A | Discharge status: Home / Self Care | Payer: Medicare Other | Attending: Emergency Medicine | Admitting: Emergency Medicine

## 2022-06-25 ENCOUNTER — Telehealth: Payer: Self-pay | Admitting: *Deleted

## 2022-06-25 ENCOUNTER — Encounter: Payer: Self-pay | Admitting: Orthopedic Surgery

## 2022-06-25 ENCOUNTER — Other Ambulatory Visit: Payer: Self-pay | Admitting: *Deleted

## 2022-06-25 DIAGNOSIS — S0990XA Unspecified injury of head, initial encounter: Secondary | ICD-10-CM

## 2022-06-25 DIAGNOSIS — Z7982 Long term (current) use of aspirin: Secondary | ICD-10-CM | POA: Diagnosis not present

## 2022-06-25 DIAGNOSIS — N39498 Other specified urinary incontinence: Secondary | ICD-10-CM | POA: Diagnosis not present

## 2022-06-25 DIAGNOSIS — F0390 Unspecified dementia without behavioral disturbance: Secondary | ICD-10-CM | POA: Diagnosis not present

## 2022-06-25 DIAGNOSIS — I7091 Generalized atherosclerosis: Secondary | ICD-10-CM | POA: Diagnosis not present

## 2022-06-25 DIAGNOSIS — R2681 Unsteadiness on feet: Secondary | ICD-10-CM | POA: Diagnosis not present

## 2022-06-25 DIAGNOSIS — W19XXXA Unspecified fall, initial encounter: Secondary | ICD-10-CM | POA: Insufficient documentation

## 2022-06-25 DIAGNOSIS — G8929 Other chronic pain: Secondary | ICD-10-CM

## 2022-06-25 DIAGNOSIS — F03B0 Unspecified dementia, moderate, without behavioral disturbance, psychotic disturbance, mood disturbance, and anxiety: Secondary | ICD-10-CM | POA: Diagnosis not present

## 2022-06-25 DIAGNOSIS — I5032 Chronic diastolic (congestive) heart failure: Secondary | ICD-10-CM

## 2022-06-25 DIAGNOSIS — Z9689 Presence of other specified functional implants: Secondary | ICD-10-CM | POA: Diagnosis not present

## 2022-06-25 DIAGNOSIS — G319 Degenerative disease of nervous system, unspecified: Secondary | ICD-10-CM | POA: Diagnosis not present

## 2022-06-25 DIAGNOSIS — R41841 Cognitive communication deficit: Secondary | ICD-10-CM | POA: Diagnosis not present

## 2022-06-25 DIAGNOSIS — D649 Anemia, unspecified: Secondary | ICD-10-CM | POA: Diagnosis not present

## 2022-06-25 DIAGNOSIS — M25461 Effusion, right knee: Secondary | ICD-10-CM | POA: Diagnosis not present

## 2022-06-25 DIAGNOSIS — Z79899 Other long term (current) drug therapy: Secondary | ICD-10-CM | POA: Diagnosis not present

## 2022-06-25 DIAGNOSIS — M25561 Pain in right knee: Secondary | ICD-10-CM | POA: Diagnosis not present

## 2022-06-25 DIAGNOSIS — J90 Pleural effusion, not elsewhere classified: Secondary | ICD-10-CM | POA: Diagnosis not present

## 2022-06-25 DIAGNOSIS — N3941 Urge incontinence: Secondary | ICD-10-CM

## 2022-06-25 DIAGNOSIS — R1312 Dysphagia, oropharyngeal phase: Secondary | ICD-10-CM | POA: Diagnosis not present

## 2022-06-25 DIAGNOSIS — M6281 Muscle weakness (generalized): Secondary | ICD-10-CM | POA: Diagnosis not present

## 2022-06-25 DIAGNOSIS — Z043 Encounter for examination and observation following other accident: Secondary | ICD-10-CM | POA: Diagnosis not present

## 2022-06-25 DIAGNOSIS — S0993XA Unspecified injury of face, initial encounter: Secondary | ICD-10-CM | POA: Diagnosis present

## 2022-06-25 DIAGNOSIS — J9811 Atelectasis: Secondary | ICD-10-CM | POA: Diagnosis not present

## 2022-06-25 DIAGNOSIS — S0083XA Contusion of other part of head, initial encounter: Secondary | ICD-10-CM | POA: Diagnosis not present

## 2022-06-25 DIAGNOSIS — R601 Generalized edema: Secondary | ICD-10-CM | POA: Diagnosis not present

## 2022-06-25 LAB — BASIC METABOLIC PANEL
BUN: 49 — AB (ref 4–21)
CO2: 29 — AB (ref 13–22)
Chloride: 103 (ref 99–108)
Creatinine: 1.5 — AB (ref 0.6–1.3)
Glucose: 105
Potassium: 4 mEq/L (ref 3.5–5.1)
Sodium: 141 (ref 137–147)

## 2022-06-25 LAB — HEPATIC FUNCTION PANEL
ALT: 13 U/L (ref 10–40)
AST: 27 (ref 14–40)
Alkaline Phosphatase: 59 (ref 25–125)
Bilirubin, Total: 0.5

## 2022-06-25 LAB — COMPREHENSIVE METABOLIC PANEL
Albumin: 3.3 — AB (ref 3.5–5.0)
Calcium: 8.4 — AB (ref 8.7–10.7)
Globulin: 3
eGFR: 45

## 2022-06-25 LAB — CBC AND DIFFERENTIAL
HCT: 37 — AB (ref 41–53)
Hemoglobin: 12.5 — AB (ref 13.5–17.5)
Neutrophils Absolute: 7124
Platelets: 403 10*3/uL — AB (ref 150–400)
WBC: 10.4

## 2022-06-25 LAB — CBC: RBC: 3.91 (ref 3.87–5.11)

## 2022-06-25 MED ORDER — ACETAMINOPHEN 500 MG PO TABS: 1000.0000 mg | ORAL_TABLET | Freq: Two times a day (BID) | ORAL | 0 refills | Status: DC

## 2022-06-25 MED ORDER — VOLTAREN ARTHRITIS PAIN 1 % EX GEL: 2.0000 g | Freq: Three times a day (TID) | CUTANEOUS | 0 refills | Status: DC

## 2022-06-25 MED ORDER — ACETAMINOPHEN 500 MG PO TABS: 1000.0000 mg | ORAL_TABLET | Freq: Every day | ORAL | 0 refills | Status: DC | PRN

## 2022-06-25 MED ORDER — ACETAMINOPHEN 325 MG PO TABS
650.0000 mg | ORAL_TABLET | Freq: Once | ORAL | Status: AC
  Administered 2022-06-25: 650 mg via ORAL
  Filled 2022-06-25: qty 2

## 2022-06-25 NOTE — Telephone Encounter (Signed)
Katie, RN at [facility], contacted the office requesting an earlier in person visit to follow up on pleurx catheter drainage. Per RN, patient's drainage is more blood tinged in color. RN states drainage began to change colors last week. RN reports today's drainage was an even darker red than last week. RN states drainage is viscous with clots present in catheter. Patient was seen in the ED today for an unwitnessed fall. Patient also evaluated by NP at [facility] who is requesting patient be seen by Dr. [name] in follow up. Appt scheduled, per request, for next week for patient to be evaluated by Dr. [name]. RN also states patient has pitting ankle edema. Advised RN to reach out to Dr. Clayborne Dana office for fluid retention. RN verbalized understanding.

## 2022-06-25 NOTE — Progress Notes (Addendum)
Location:  [place] Room Number: 28/A Place of Service:  ALF [phone]) Provider:  Yvonna Alanis, NP   Virgie Dad, MD  Patient Care Team: Virgie Dad, MD as PCP - General (Internal Medicine) Bensimhon, Shaune Pascal, MD as PCP - Advanced Heart Failure (Cardiology) Wenda Low, MD as Consulting Physician (Internal Medicine)  Extended Emergency Contact Information Primary Emergency Contact: [place] Mobile Phone: [phone] Relation: Son Secondary Emergency Contact: E[place],[name] Mobile Phone: [phone] Relation: Son  Code Status:  DNR Goals of care: Advanced Directive information    06/05/2022   12:36 PM  Advanced Directives  Does Patient Have a Medical Advance Directive? Yes  Type of Paramedic of [place];Living will;Out of facility DNR (pink MOST or yellow form)  Does patient want to make changes to medical advance directive? No - Patient declined  Copy of [place] in Chart? Yes - validated most recent copy scanned in chart (See row information)  Pre-existing out of facility DNR order (yellow form or pink MOST form) Pink Most/Yellow Form available - Physician notified to receive inpatient order;Yellow form placed in chart (order not valid for inpatient use)     Chief Complaint  Patient presents with   Hospitalization Follow-up    HPI:  Pt is a 88 y.o. male seen today for f/u s/p ED visit 06/25/2022.   Sons present during encounter.   He currently resides on the assisted nursing unit at [facility]. PMH: aortic atherosclerosis, temporal arteritis, H/o TIA, pleural effusion, GERD, optic neuritis, yellow nail syndrome, fibroma of prostate, gout, hyponatremia, and unstable gait.   This morning he was found by staff on the floor. Blood to the back of his head. He was sent to [facility] ED for evaluation. Routine labs deferred by family. Encouraged to recheck in outpatient setting. CT head/spine  negative for acute intracranial abnormality to head or spine, chronic ischemic microangiopathy with generalized atrophy noted. CXR noted bilateral effusions, R>L with bibasilar atelectasis. Right knee xray noted advanced lateral femorotibial joint space narrowing with osteophytes. He was discharged back to [facility] within a few hours.   Today, he denies generalized pain, headaches, N/V. He appears less talkative. He has trouble answering questions. He was more active and social a few weeks ago. Sons report he stays in his room and less ambulatory. Nursing reports increased urinary incontinence. He is not asking for assistance to bathroom anymore. Pleurx drain emptied by staff, blood tinged x 2 weeks. BLE 3+ pitting edema. He remains on metolazone weekly, spirolactone/torsemide daily.     Past Medical History:  Diagnosis Date   Actinic keratoses    Arthritis    CHF (congestive heart failure) (HCC)    Chronic bilateral pleural effusions    Chronic sinusitis    Colon polyps    Dyspnea 07/13/2013   BNP normal   Edema 07/13/2013   GERD (gastroesophageal reflux disease)    omeprazole 1-2 times daily   H/O TIA (transient ischemic attack) and stroke    on statins and plavix -saw Dr.Sethi in the past   Headache    History of BPH    History of TIA (transient ischemic attack) 07/13/2013   Left inguinal hernia    Left inguinal hernia 01/27/2019   Lumbar radiculopathy    Need for shingles vaccine    Optic neuritis 10/2015   left eye   Perennial allergic rhinitis    Polymyalgia rheumatica ([place]) 07/13/2013   Daily prednisone  Restless leg syndrome    Retinal disease    right eye since birth    Shingles    Temporal arteritis ([place]) 10/2015   Wears glasses    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   Past Surgical History:  Procedure Laterality Date   BRAVO [place] STUDY N/A 04/18/2016   Procedure: BRAVO [place];  Surgeon: Wilford Corner, MD;  Location: [facility];  Service: Endoscopy;   Laterality: N/A;   CHEST TUBE INSERTION Right 02/13/2018   Procedure: INSERTION PLEURAL DRAINAGE CATHETER;  Surgeon: Ivin Poot, MD;  Location: [place];  Service: Thoracic;  Laterality: Right;   CHEST TUBE INSERTION Left 11/27/2019   Procedure: REDO INSERTION PLEURAL DRAINAGE CATHETER USING 15.5 FR. [place] CATH;  Surgeon: [name], Collier Salina, MD;  Location: [place];  Service: Thoracic;  Laterality: Left;   COLONOSCOPY     drropy eyelid surgery     ESOPHAGOGASTRODUODENOSCOPY (EGD) WITH PROPOFOL N/A 04/18/2016   Procedure: ESOPHAGOGASTRODUODENOSCOPY (EGD) WITH PROPOFOL;  Surgeon: Wilford Corner, MD;  Location: [facility];  Service: Endoscopy;  Laterality: N/A;   INGUINAL HERNIA REPAIR Left 01/27/2019   Procedure: OPEN REPAIR LEFT INGUINAL HERNIA WITH MESH;  Surgeon: Fanny Skates, MD;  Location: [place];  Service: General;  Laterality: Left;  GENERAL AND TAP BLOCK ANESTHESIA   INSERTION OF MESH Left 01/27/2019   Procedure: Insertion Of Mesh;  Surgeon: Fanny Skates, MD;  Location: [place];  Service: General;  Laterality: Left;   IR THORACENTESIS ASP PLEURAL SPACE W/IMG GUIDE  02/04/2018   IR THORACENTESIS ASP PLEURAL SPACE W/IMG GUIDE  02/06/2018   REMOVAL OF PLEURAL DRAINAGE CATHETER Left 11/27/2019   Procedure: REMOVAL OF PLEURAL DRAINAGE CATHETER;  Surgeon: Ivin Poot, MD;  Location: [place];  Service: Thoracic;  Laterality: Left;   REMOVAL OF PLEURAL DRAINAGE CATHETER Left 03/31/2020   Procedure: REMOVAL OF PLEURAL DRAINAGE CATHETER;  Surgeon: Ivin Poot, MD;  Location: [place];  Service: Thoracic;  Laterality: Left;   RIGHT/LEFT HEART CATH AND CORONARY ANGIOGRAPHY N/A 06/24/2018   Procedure: RIGHT/LEFT HEART CATH AND CORONARY ANGIOGRAPHY;  Surgeon: Jolaine Artist, MD;  Location: [place] CV LAB;  Service: Cardiovascular;  Laterality: N/A;   VASECTOMY      No Known Allergies  Outpatient Encounter Medications as of 06/25/2022  Medication Sig   acetaminophen (TYLENOL) 325 MG tablet  Take 650 mg by mouth every 6 (six) hours as needed.   allopurinol (ZYLOPRIM) 300 MG tablet Take 150 mg by mouth in the morning. For gout   aspirin 81 MG chewable tablet Chew 81 mg by mouth daily. Chew and swallow tablet   donepezil (ARICEPT ODT) 10 MG disintegrating tablet Take 1 tablet (10 mg total) by mouth at bedtime. For co++ +   doxazosin (CARDURA) 8 MG tablet Take 8 mg by mouth daily.   famotidine (PEPCID) 20 MG tablet Take 20 mg by mouth daily as needed for heartburn or indigestion.   levothyroxine (SYNTHROID) 25 MCG tablet Take 25 mcg by mouth daily.   memantine (NAMENDA) 10 MG tablet Take 1 tablet (10 mg total) by mouth 2 (two) times daily.   metolazone (ZAROXOLYN) 2.5 MG tablet Take 1 tablet (2.5 mg total) by mouth every Wednesday.   pantoprazole (PROTONIX) 20 MG tablet Take 20 mg by mouth daily.    potassium chloride SA (KLOR-CON M) 20 MEQ tablet Take 20 mEq by mouth daily.   potassium chloride SA (KLOR-CON M) 20 MEQ tablet Take 20 mEq by mouth daily. Give 2 tablets  by mouth in the morning for mineral/supplement   potassium chloride SA (KLOR-CON M) 20 MEQ tablet TAKE 3 TABLETS BY MOUTH DAILY ,  TAKE EXTRA 2 TABLETS BY MOUTH  WHEN YOU TAKE METOLAZONE   simvastatin (ZOCOR) 20 MG tablet Take 20 mg by mouth daily.   spironolactone (ALDACTONE) 25 MG tablet Take 0.5 tablets (12.5 mg total) by mouth daily.   torsemide (DEMADEX) 20 MG tablet Take 60 mg by mouth 2 (two) times daily. 3 Tablets   No facility-administered encounter medications on file as of 06/25/2022.    Review of Systems  Unable to perform ROS: Dementia    Immunization History  Administered Date(s) Administered   Fluad Quad(high Dose 65+) 04/27/2021   Influenza, High Dose Seasonal PF 04/15/2017, 05/16/2018   Influenza,inj,Quad PF,6+ Mos 04/23/2015   Moderna SARS-COV2 Booster Vaccination 12/28/2020, 04/20/2021   Moderna Sars-Covid-2 Vaccination 07/26/2019, 08/26/2019, 06/06/2020   Pneumococcal Conjugate-13 02/14/2016,  07/23/2016   Pneumococcal Polysaccharide-23 11/13/2012   Td 01/29/2006   Tdap 08/07/2018   Zoster Recombinat (Shingrix) 02/18/2019   Zoster, Live 02/18/2019, 07/01/2019   Pertinent  Health Maintenance Due  Topic Date Due   INFLUENZA VACCINE  02/20/2022      01/27/2019    7:08 AM 01/27/2019   10:15 AM 03/19/2022    1:03 PM 05/10/2022    3:46 PM 06/25/2022    2:44 AM  Fall Risk  Falls in the past year?   1 1   Was there an injury with Fall?   0 0   Fall Risk Category Calculator   2 2   Fall Risk Category   Moderate Moderate   Patient Fall Risk Level Moderate fall risk High fall risk Moderate fall risk Moderate fall risk High fall risk  Patient at Risk for Falls Due to   History of fall(s);Impaired balance/gait;Impaired mobility History of fall(s);Impaired balance/gait;Impaired mobility   Fall risk Follow up   Falls evaluation completed;Falls prevention discussed;Education provided Falls evaluation completed    Functional Status Survey:    Vitals:   06/25/22 1137  BP: (!) 92/56  Pulse: 71  Resp: 19  Temp: 98.3 F (36.8 C)  SpO2: 93%  Weight: 139 lb 12.8 oz (63.4 kg)  Height: '5\' 6"'$  (1.676 m)   Body mass index is 22.56 kg/m. Physical Exam Vitals reviewed.  Constitutional:      General: He is not in acute distress. HENT:     Head: Normocephalic. Contusion present. No raccoon eyes or Battle's sign.     Comments: Approx 1-2 cm bruise to occipital portion of head, no skin breakdown, mild tenderness noted.  Eyes:     General:        Right eye: No discharge.        Left eye: No discharge.     Pupils: Pupils are equal, round, and reactive to light.  Cardiovascular:     Rate and Rhythm: Normal rate and regular rhythm.     Pulses: Normal pulses.     Heart sounds: Murmur heard.  Pulmonary:     Effort: Pulmonary effort is normal. No respiratory distress.     Breath sounds: Examination of the right-middle field reveals rales. Examination of the left-middle field reveals rales.  Rales present. No wheezing.  Abdominal:     General: Bowel sounds are normal. There is no distension.     Palpations: Abdomen is soft.     Tenderness: There is no abdominal tenderness.     Comments: Pleurx drain to RUQ, insertion site  CDI, output blood tinged  Musculoskeletal:     Cervical back: Neck supple.     Right knee: Swelling and crepitus present. No erythema. Decreased range of motion. Tenderness present.     Right lower leg: Edema present.     Left lower leg: Edema present.     Comments: 3+ pitting  Skin:    General: Skin is warm and dry.     Capillary Refill: Capillary refill takes less than 2 seconds.  Neurological:     General: No focal deficit present.     Mental Status: He is alert. Mental status is at baseline.     Motor: Weakness present.     Gait: Gait abnormal.  Psychiatric:        Mood and Affect: Mood normal.     Comments: Alert to self/person, less talkative, follows commands      Labs reviewed: Recent Labs    09/14/21 1411 11/06/21 0000  NA 138 143  K 3.5 3.9  CL 99 106  CO2 29 31*  GLUCOSE 118*  --   BUN 28* 33*  CREATININE 1.50* 1.2  CALCIUM 8.4* 8.2*   Recent Labs    11/06/21 0000  AST 11*  ALT 5*  ALKPHOS 48  ALBUMIN 3.3*   Recent Labs    11/06/21 0000  WBC 7.1  HGB 12.7*  HCT 39*  PLT 308   Lab Results  Component Value Date   TSH 3.07 11/06/2021   No results found for: "HGBA1C" No results found for: "CHOL", "HDL", "LDLCALC", "LDLDIRECT", "TRIG", "CHOLHDL"  Significant Diagnostic Results in last 30 days:  DG Knee Complete 4 Views Right  Result Date: 06/25/2022 CLINICAL DATA:  Several weeks history of knee pain.  Fell today. EXAM: RIGHT KNEE - COMPLETE 4+ VIEW COMPARISON:  None Available. FINDINGS: There is osteopenia without evidence of fractures. Small suprapatellar bursal effusion. Mild generalized edema in the superficial soft tissues noted with calcifications in the femoral, popliteal and popliteal trifurcation arteries.  There is bone-on-bone loss of the lateral femorotibial joint space with small marginal osteophytes. Other joint spaces are maintained. Trace spurring patellofemoral joint. No loose body is seen. No erosive arthropathy. Trace enthesopathy anterior patella. IMPRESSION: 1. Osteopenia and small effusion without acute fracture. 2. Advanced lateral femorotibial joint space narrowing with small marginal osteophytes. 3. Vascular calcifications. Electronically Signed   By: Telford Nab M.D.   On: 06/25/2022 06:23   DG Chest 2 View  Result Date: 06/25/2022 CLINICAL DATA:  Fall EXAM: CHEST - 2 VIEW COMPARISON:  08/21/2021 FINDINGS: Bilateral pleural effusions which appear loculated, right larger than left. PleurX catheter noted in the right lower chest. Bibasilar opacities, likely atelectasis. Heart is normal size. No acute bony abnormality. IMPRESSION: Loculated bilateral effusions, right greater than left. Bibasilar atelectasis. Electronically Signed   By: Rolm Baptise M.D.   On: 06/25/2022 03:55   CT Head Wo Contrast  Result Date: 06/25/2022 CLINICAL DATA:  Fall EXAM: CT HEAD WITHOUT CONTRAST CT CERVICAL SPINE WITHOUT CONTRAST TECHNIQUE: Multidetector CT imaging of the head and cervical spine was performed following the standard protocol without intravenous contrast. Multiplanar CT image reconstructions of the cervical spine were also generated. RADIATION DOSE REDUCTION: This exam was performed according to the departmental dose-optimization program which includes automated exposure control, adjustment of the mA and/or kV according to patient size and/or use of iterative reconstruction technique. COMPARISON:  None Available. FINDINGS: CT HEAD FINDINGS Brain: There is no mass, hemorrhage or extra-axial collection. There is generalized atrophy  without lobar predilection. There is hypoattenuation of the periventricular white matter, most commonly indicating chronic ischemic microangiopathy. Vascular: No abnormal  hyperdensity of the major intracranial arteries or dural venous sinuses. No intracranial atherosclerosis. Skull: The visualized skull base, calvarium and extracranial soft tissues are normal. Sinuses/Orbits: Chronic maxillary and sphenoid sinusitis. The orbits are normal. CT CERVICAL SPINE FINDINGS Alignment: Grade 1 retrolisthesis at C4-5 facets are aligned. Occipital condyles are normally positioned. Skull base and vertebrae: No acute fracture. Soft tissues and spinal canal: No prevertebral fluid or swelling. No visible canal hematoma. Disc levels: No advanced spinal canal or neural foraminal stenosis. Upper chest: No pneumothorax, pulmonary nodule or pleural effusion. Other: Normal visualized paraspinal cervical soft tissues. IMPRESSION: 1. No acute intracranial abnormality. 2. Chronic ischemic microangiopathy and generalized atrophy. 3. No acute abnormality of the cervical spine. Electronically Signed   By: Ulyses Jarred M.D.   On: 06/25/2022 03:43   CT Cervical Spine Wo Contrast  Result Date: 06/25/2022 CLINICAL DATA:  Fall EXAM: CT HEAD WITHOUT CONTRAST CT CERVICAL SPINE WITHOUT CONTRAST TECHNIQUE: Multidetector CT imaging of the head and cervical spine was performed following the standard protocol without intravenous contrast. Multiplanar CT image reconstructions of the cervical spine were also generated. RADIATION DOSE REDUCTION: This exam was performed according to the departmental dose-optimization program which includes automated exposure control, adjustment of the mA and/or kV according to patient size and/or use of iterative reconstruction technique. COMPARISON:  None Available. FINDINGS: CT HEAD FINDINGS Brain: There is no mass, hemorrhage or extra-axial collection. There is generalized atrophy without lobar predilection. There is hypoattenuation of the periventricular white matter, most commonly indicating chronic ischemic microangiopathy. Vascular: No abnormal hyperdensity of the major  intracranial arteries or dural venous sinuses. No intracranial atherosclerosis. Skull: The visualized skull base, calvarium and extracranial soft tissues are normal. Sinuses/Orbits: Chronic maxillary and sphenoid sinusitis. The orbits are normal. CT CERVICAL SPINE FINDINGS Alignment: Grade 1 retrolisthesis at C4-5 facets are aligned. Occipital condyles are normally positioned. Skull base and vertebrae: No acute fracture. Soft tissues and spinal canal: No prevertebral fluid or swelling. No visible canal hematoma. Disc levels: No advanced spinal canal or neural foraminal stenosis. Upper chest: No pneumothorax, pulmonary nodule or pleural effusion. Other: Normal visualized paraspinal cervical soft tissues. IMPRESSION: 1. No acute intracranial abnormality. 2. Chronic ischemic microangiopathy and generalized atrophy. 3. No acute abnormality of the cervical spine. Electronically Signed   By: Ulyses Jarred M.D.   On: 06/25/2022 03:43    Assessment/Plan Head injury, initial encounter - 12/04 mechanical fall- found on floor with head injury - CT head/spine negative for acute abnormality  2. Moderate dementia without behavioral disturbance, psychotic disturbance, mood disturbance, or anxiety, unspecified dementia type ([place]) - talking less, ambulating less, increased incontinence, cannot answer questions easily - no behaviors - may need Healthcare in future - cont Nam[place]  3. Urge incontinence - ongoing - using briefs now - forgetting to use bathroom - related to diuretic use for recurrent pleural effusion  4. Chronic pain of right knee - xray noted advanced lateral femorotibial joint space narrowing with osteophytes - ortho appointment cancelled - start tylenol 1000 mg po bid - start voltaren gel- 2% - apply to right knee TID   5. Recurrent pleural effusion - followed by Dr. Darcey Nora - CXR noted bilateral effusions, R>L with bibasilar atelectasis - pleurx drain with blood tinged output - rales on  exam - schedule appointment with Dr. Darcey Nora - cbc/diff - cont metolazone, spirolactone, torsemide   6. Chest  tube in place - see above  7. Chronic diastolic CHF (congestive heart failure) (HCC) - weight stable, no sob - 3+ pitting edema - SBP averaging 100-130 - cmp, BNP    Family/ staff Communication: plan discussed with patient and nurse  Labs/tests ordered:  cbc/diff, cmp, BNP 06/25/2022

## 2022-06-25 NOTE — ED Notes (Signed)
Pt is alert and able to answer orientation questions however he does not remember how or why he fell.  Reports pain to back but nothing else.

## 2022-06-25 NOTE — ED Triage Notes (Signed)
Pt arrives via [facility] from [facility]. Per medic, pt has hx of dementia, unwitnessed fall at facility. Hit the back of his head. Hematoma to the back of his head, no blood thinners. 144/78, hr 78, 16, 97% ra, cbg 152.

## 2022-06-25 NOTE — ED Provider Notes (Signed)
[facility] DEPT Provider Note   CSN: [id number] Arrival date & time: 06/25/22  0230     History  Chief Complaint  Patient presents with   Stuart Watson is a 88 y.o. male.  The history is provided by the patient and medical records.  Stuart Watson is a 88 y.o. male who presents to the Emergency Department complaining of fall.  Level 5 caveat due to dementia.  History is provided by the patient, EMS.  He presents [facility] the emergency department from [facility] [place] for evaluation of injuries following an unwitnessed fall.  Patient does recall falling, is unsure what made him fall.  He complains of pain to the back of his head.  Unknown if there were any preceding symptoms or loss of consciousness.  Patient denies any additional complaints. Additional history obtained by [facility] from [facility] as well as patient's son over the phone.  Nurse states this was an unwitnessed fall, no known recent illnesses.  Son states that the patient has been complaining of knee pain for the last several weeks and is scheduled to see orthopedics later today.  He does request a knee x-ray to help and assist in his orthopedics visit today.  No reports of fevers, chest pain, difficulty breathing, vomiting.    Home Medications Prior to Admission medications   Medication Sig Start Date End Date Taking? Authorizing Provider  acetaminophen (TYLENOL) 325 MG tablet Take 650 mg by mouth every 6 (six) hours as needed.    [provider]  allopurinol (ZYLOPRIM) 300 MG tablet Take 150 mg by mouth in the morning. For gout    [provider]  aspirin 81 MG chewable tablet Chew 81 mg by mouth daily. Chew and swallow tablet    [provider]  donepezil (ARICEPT ODT) 10 MG disintegrating tablet Take 1 tablet (10 mg total) by mouth at bedtime. For co++ + 06/07/22   Virgie Dad, MD  doxazosin (CARDURA) 8 MG tablet Take 8 mg by mouth daily.    [provider]  famotidine (PEPCID) 20 MG tablet Take 20 mg by mouth daily as needed for heartburn or indigestion.    [provider]  levothyroxine (SYNTHROID) 25 MCG tablet Take 25 mcg by mouth daily. 12/29/19   [provider]  memantine (NAMENDA) 10 MG tablet Take 1 tablet (10 mg total) by mouth 2 (two) times daily. 05/23/22   Yvonna Alanis, NP  metolazone (ZAROXOLYN) 2.5 MG tablet Take 1 tablet (2.5 mg total) by mouth every Wednesday. 09/06/21   Bensimhon, Shaune Pascal, MD  pantoprazole (PROTONIX) 20 MG tablet Take 20 mg by mouth daily.     [provider]  potassium chloride SA (KLOR-CON M) 20 MEQ tablet Take 20 mEq by mouth daily.    [provider]  potassium chloride SA (KLOR-CON M) 20 MEQ tablet Take 20 mEq by mouth daily. Give 2 tablets by mouth in the morning for mineral/supplement    [provider]  simvastatin (ZOCOR) 20 MG tablet Take 20 mg by mouth daily.    [provider]  spironolactone (ALDACTONE) 25 MG tablet Take 0.5 tablets (12.5 mg total) by mouth daily. 06/07/22   Virgie Dad, MD  torsemide (DEMADEX) 20 MG tablet Take 60 mg by mouth 2 (two) times daily. 3 Tablets    [provider]      Allergies    Patient has no known allergies.  Review of Systems   Review of Systems  All other systems reviewed and are negative.   Physical Exam Updated Vital Signs BP 127/72   Pulse 84   Temp 98.6 F (37 C) (Oral)   Resp 16   SpO2 96%  Physical Exam Vitals and nursing note reviewed.  Constitutional:      Appearance: He is well-developed.  HENT:     Head: Normocephalic.     Comments: Hematoma/abrasion to the occipital scalp Cardiovascular:     Rate and Rhythm: Normal rate and regular rhythm.  Pulmonary:     Effort: Pulmonary effort is normal. No respiratory distress.  Abdominal:     Palpations: Abdomen is soft.     Tenderness: There is no abdominal tenderness. There is no guarding or rebound.   Musculoskeletal:        General: No tenderness.     Comments: 3+ pitting edema to bilateral lower extremities  Skin:    General: Skin is warm and dry.  Neurological:     Mental Status: He is alert.     Comments: Oriented to person.  Disoriented to place, time and recent events.  Moves all extremities symmetrically  Psychiatric:        Behavior: Behavior normal.     ED Results / Procedures / Treatments   Labs (all labs ordered are listed, but only abnormal results are displayed) Labs Reviewed - No data to display  EKG None  Radiology DG Knee Complete 4 Views Right  Result Date: 06/25/2022 CLINICAL DATA:  Several weeks history of knee pain.  Fell today. EXAM: RIGHT KNEE - COMPLETE 4+ VIEW COMPARISON:  None Available. FINDINGS: There is osteopenia without evidence of fractures. Small suprapatellar bursal effusion. Mild generalized edema in the superficial soft tissues noted with calcifications in the femoral, popliteal and popliteal trifurcation arteries. There is bone-on-bone loss of the lateral femorotibial joint space with small marginal osteophytes. Other joint spaces are maintained. Trace spurring patellofemoral joint. No loose body is seen. No erosive arthropathy. Trace enthesopathy anterior patella. IMPRESSION: 1. Osteopenia and small effusion without acute fracture. 2. Advanced lateral femorotibial joint space narrowing with small marginal osteophytes. 3. Vascular calcifications. Electronically Signed   By: Telford Nab M.D.   On: 06/25/2022 06:23   DG Chest 2 View  Result Date: 06/25/2022 CLINICAL DATA:  Fall EXAM: CHEST - 2 VIEW COMPARISON:  08/21/2021 FINDINGS: Bilateral pleural effusions which appear loculated, right larger than left. PleurX catheter noted in the right lower chest. Bibasilar opacities, likely atelectasis. Heart is normal size. No acute bony abnormality. IMPRESSION: Loculated bilateral effusions, right greater than left. Bibasilar atelectasis. Electronically  Signed   By: Rolm Baptise M.D.   On: 06/25/2022 03:55   CT Head Wo Contrast  Result Date: 06/25/2022 CLINICAL DATA:  Fall EXAM: CT HEAD WITHOUT CONTRAST CT CERVICAL SPINE WITHOUT CONTRAST TECHNIQUE: Multidetector CT imaging of the head and cervical spine was performed following the standard protocol without intravenous contrast. Multiplanar CT image reconstructions of the cervical spine were also generated. RADIATION DOSE REDUCTION: This exam was performed according to the departmental dose-optimization program which includes automated exposure control, adjustment of the mA and/or kV according to patient size and/or use of iterative reconstruction technique. COMPARISON:  None Available. FINDINGS: CT HEAD FINDINGS Brain: There is no mass, hemorrhage or extra-axial collection. There is generalized atrophy without lobar predilection. There is hypoattenuation of the periventricular white matter, most commonly indicating chronic ischemic microangiopathy. Vascular: No abnormal hyperdensity of the major intracranial arteries  or dural venous sinuses. No intracranial atherosclerosis. Skull: The visualized skull base, calvarium and extracranial soft tissues are normal. Sinuses/Orbits: Chronic maxillary and sphenoid sinusitis. The orbits are normal. CT CERVICAL SPINE FINDINGS Alignment: Grade 1 retrolisthesis at C4-5 facets are aligned. Occipital condyles are normally positioned. Skull base and vertebrae: No acute fracture. Soft tissues and spinal canal: No prevertebral fluid or swelling. No visible canal hematoma. Disc levels: No advanced spinal canal or neural foraminal stenosis. Upper chest: No pneumothorax, pulmonary nodule or pleural effusion. Other: Normal visualized paraspinal cervical soft tissues. IMPRESSION: 1. No acute intracranial abnormality. 2. Chronic ischemic microangiopathy and generalized atrophy. 3. No acute abnormality of the cervical spine. Electronically Signed   By: Ulyses Jarred M.D.   On:  06/25/2022 03:43   CT Cervical Spine Wo Contrast  Result Date: 06/25/2022 CLINICAL DATA:  Fall EXAM: CT HEAD WITHOUT CONTRAST CT CERVICAL SPINE WITHOUT CONTRAST TECHNIQUE: Multidetector CT imaging of the head and cervical spine was performed following the standard protocol without intravenous contrast. Multiplanar CT image reconstructions of the cervical spine were also generated. RADIATION DOSE REDUCTION: This exam was performed according to the departmental dose-optimization program which includes automated exposure control, adjustment of the mA and/or kV according to patient size and/or use of iterative reconstruction technique. COMPARISON:  None Available. FINDINGS: CT HEAD FINDINGS Brain: There is no mass, hemorrhage or extra-axial collection. There is generalized atrophy without lobar predilection. There is hypoattenuation of the periventricular white matter, most commonly indicating chronic ischemic microangiopathy. Vascular: No abnormal hyperdensity of the major intracranial arteries or dural venous sinuses. No intracranial atherosclerosis. Skull: The visualized skull base, calvarium and extracranial soft tissues are normal. Sinuses/Orbits: Chronic maxillary and sphenoid sinusitis. The orbits are normal. CT CERVICAL SPINE FINDINGS Alignment: Grade 1 retrolisthesis at C4-5 facets are aligned. Occipital condyles are normally positioned. Skull base and vertebrae: No acute fracture. Soft tissues and spinal canal: No prevertebral fluid or swelling. No visible canal hematoma. Disc levels: No advanced spinal canal or neural foraminal stenosis. Upper chest: No pneumothorax, pulmonary nodule or pleural effusion. Other: Normal visualized paraspinal cervical soft tissues. IMPRESSION: 1. No acute intracranial abnormality. 2. Chronic ischemic microangiopathy and generalized atrophy. 3. No acute abnormality of the cervical spine. Electronically Signed   By: Ulyses Jarred M.D.   On: 06/25/2022 03:43     Procedures Procedures    Medications Ordered in ED Medications  acetaminophen (TYLENOL) tablet 650 mg (has no administration in time range)    ED Course/ Medical Decision Making/ A&P                           Medical Decision Making Amount and/or Complexity of Data Reviewed Radiology: ordered.  Risk OTC drugs.   Patient here for evaluation following an unwitnessed fall, has a history of dementia, chronic pleural effusion with pleural catheter in place.  Patient complains of soreness to his head, no additional complaints.  He is confused.  Sounds like aside from knee pain, which he is scheduled to see orthopedics later today he has not had any recent illnesses or issues.  Discussed with son regarding checking routine labs in the emergency department-plan to defer at this time for outpatient testing.  In terms of his fall, he does have an abrasion and hematoma to the occipital scalp-CT head and C-spine are without acute abnormalities.  Chest x-ray does demonstrate bilateral effusions-patient does have a right-sided pleural catheter in place with clean and dry dressing intact.  His effusions can be followed up by CT surgery as an outpatient.  Plan to discharge with outpatient resources and return precautions.        Final Clinical Impression(s) / ED Diagnoses Final diagnoses:  Fall, initial encounter  Contusion of other part of head, initial encounter    Rx / DC Orders ED Discharge Orders     None         Quintella Reichert, MD 06/25/22 [phone]

## 2022-06-25 NOTE — ED Notes (Signed)
Pt ambulated down the hall with a steady gate using a walker.

## 2022-06-26 DIAGNOSIS — M25561 Pain in right knee: Secondary | ICD-10-CM | POA: Diagnosis not present

## 2022-06-26 DIAGNOSIS — N39498 Other specified urinary incontinence: Secondary | ICD-10-CM | POA: Diagnosis not present

## 2022-06-26 DIAGNOSIS — R2681 Unsteadiness on feet: Secondary | ICD-10-CM | POA: Diagnosis not present

## 2022-06-26 DIAGNOSIS — J90 Pleural effusion, not elsewhere classified: Secondary | ICD-10-CM | POA: Diagnosis not present

## 2022-06-26 DIAGNOSIS — M6281 Muscle weakness (generalized): Secondary | ICD-10-CM | POA: Diagnosis not present

## 2022-06-26 DIAGNOSIS — I5032 Chronic diastolic (congestive) heart failure: Secondary | ICD-10-CM | POA: Diagnosis not present

## 2022-06-26 DIAGNOSIS — R1312 Dysphagia, oropharyngeal phase: Secondary | ICD-10-CM | POA: Diagnosis not present

## 2022-06-26 DIAGNOSIS — R41841 Cognitive communication deficit: Secondary | ICD-10-CM | POA: Diagnosis not present

## 2022-06-27 DIAGNOSIS — N39498 Other specified urinary incontinence: Secondary | ICD-10-CM | POA: Diagnosis not present

## 2022-06-27 DIAGNOSIS — R2681 Unsteadiness on feet: Secondary | ICD-10-CM | POA: Diagnosis not present

## 2022-06-27 DIAGNOSIS — R1312 Dysphagia, oropharyngeal phase: Secondary | ICD-10-CM | POA: Diagnosis not present

## 2022-06-27 DIAGNOSIS — M25561 Pain in right knee: Secondary | ICD-10-CM | POA: Diagnosis not present

## 2022-06-27 DIAGNOSIS — M6281 Muscle weakness (generalized): Secondary | ICD-10-CM | POA: Diagnosis not present

## 2022-06-27 DIAGNOSIS — R41841 Cognitive communication deficit: Secondary | ICD-10-CM | POA: Diagnosis not present

## 2022-06-28 ENCOUNTER — Other Ambulatory Visit: Payer: Self-pay | Admitting: Cardiothoracic Surgery

## 2022-06-28 DIAGNOSIS — M6281 Muscle weakness (generalized): Secondary | ICD-10-CM | POA: Diagnosis not present

## 2022-06-28 DIAGNOSIS — R2681 Unsteadiness on feet: Secondary | ICD-10-CM | POA: Diagnosis not present

## 2022-06-28 DIAGNOSIS — J90 Pleural effusion, not elsewhere classified: Secondary | ICD-10-CM

## 2022-06-28 DIAGNOSIS — R1312 Dysphagia, oropharyngeal phase: Secondary | ICD-10-CM | POA: Diagnosis not present

## 2022-06-28 DIAGNOSIS — M25561 Pain in right knee: Secondary | ICD-10-CM | POA: Diagnosis not present

## 2022-06-28 DIAGNOSIS — R41841 Cognitive communication deficit: Secondary | ICD-10-CM | POA: Diagnosis not present

## 2022-06-28 DIAGNOSIS — N39498 Other specified urinary incontinence: Secondary | ICD-10-CM | POA: Diagnosis not present

## 2022-06-29 DIAGNOSIS — R2681 Unsteadiness on feet: Secondary | ICD-10-CM | POA: Diagnosis not present

## 2022-06-29 DIAGNOSIS — R41841 Cognitive communication deficit: Secondary | ICD-10-CM | POA: Diagnosis not present

## 2022-06-29 DIAGNOSIS — M6281 Muscle weakness (generalized): Secondary | ICD-10-CM | POA: Diagnosis not present

## 2022-06-29 DIAGNOSIS — M25561 Pain in right knee: Secondary | ICD-10-CM | POA: Diagnosis not present

## 2022-06-29 DIAGNOSIS — N39498 Other specified urinary incontinence: Secondary | ICD-10-CM | POA: Diagnosis not present

## 2022-06-29 DIAGNOSIS — R1312 Dysphagia, oropharyngeal phase: Secondary | ICD-10-CM | POA: Diagnosis not present

## 2022-07-02 ENCOUNTER — Ambulatory Visit (INDEPENDENT_AMBULATORY_CARE_PROVIDER_SITE_OTHER): Payer: Medicare Other | Admitting: Cardiothoracic Surgery

## 2022-07-02 ENCOUNTER — Ambulatory Visit
Admission: RE | Admit: 2022-07-02 | Discharge: 2022-07-02 | Disposition: A | Discharge status: Home / Self Care | Payer: Medicare Other | Source: Ambulatory Visit | Attending: Cardiothoracic Surgery | Admitting: Cardiothoracic Surgery

## 2022-07-02 ENCOUNTER — Encounter: Payer: Self-pay | Admitting: Cardiothoracic Surgery

## 2022-07-02 VITALS — BP 99/50 | HR 67 | Resp 20 | Ht 66.0 in | Wt 139.0 lb

## 2022-07-02 DIAGNOSIS — R41841 Cognitive communication deficit: Secondary | ICD-10-CM | POA: Diagnosis not present

## 2022-07-02 DIAGNOSIS — J90 Pleural effusion, not elsewhere classified: Secondary | ICD-10-CM | POA: Diagnosis not present

## 2022-07-02 DIAGNOSIS — R2681 Unsteadiness on feet: Secondary | ICD-10-CM | POA: Diagnosis not present

## 2022-07-02 DIAGNOSIS — Z938 Other artificial opening status: Secondary | ICD-10-CM | POA: Diagnosis not present

## 2022-07-02 DIAGNOSIS — M25561 Pain in right knee: Secondary | ICD-10-CM | POA: Diagnosis not present

## 2022-07-02 DIAGNOSIS — R0602 Shortness of breath: Secondary | ICD-10-CM | POA: Diagnosis not present

## 2022-07-02 DIAGNOSIS — Z4682 Encounter for fitting and adjustment of non-vascular catheter: Secondary | ICD-10-CM | POA: Diagnosis not present

## 2022-07-02 DIAGNOSIS — M6281 Muscle weakness (generalized): Secondary | ICD-10-CM | POA: Diagnosis not present

## 2022-07-02 DIAGNOSIS — R1312 Dysphagia, oropharyngeal phase: Secondary | ICD-10-CM | POA: Diagnosis not present

## 2022-07-02 DIAGNOSIS — N39498 Other specified urinary incontinence: Secondary | ICD-10-CM | POA: Diagnosis not present

## 2022-07-02 NOTE — Progress Notes (Signed)
HPI: Patient presents for evaluation and drainage of a right heart catheter placed 5 years ago for a recurrent benign pleural effusion.  The catheter was drained on a weekly schedule at his skilled nursing facility.  Last drainage was 1 week ago 250 cc.  Patient recently had a fall with an ER evaluation.  CT scan of the skull was negative.  Chest x-ray showed no rib fracture and Pleurx catheter remained in position but with a small right pleural loculated effusion or hematoma.  The patient presents today breathing comfortably on room air.  The Pleurx catheter is drained using sterile technique and there is scant bloody output.  Chest x-ray performed today is personally reviewed showing mild loculated effusion on the right in the greater fissure.  No significant reaccumulation of pleural fluid despite scant output from the chest tube.  Current Outpatient Medications  Medication Sig Dispense Refill   acetaminophen (TYLENOL) 500 MG tablet Take 2 tablets (1,000 mg total) by mouth in the morning and at bedtime. 30 tablet 0   acetaminophen (TYLENOL) 500 MG tablet Take 2 tablets (1,000 mg total) by mouth daily as needed. 30 tablet 0   allopurinol (ZYLOPRIM) 300 MG tablet Take 150 mg by mouth in the morning. For gout     aspirin 81 MG chewable tablet Chew 81 mg by mouth daily. Chew and swallow tablet     donepezil (ARICEPT ODT) 10 MG disintegrating tablet Take 1 tablet (10 mg total) by mouth at bedtime. For co++ + 90 tablet 1   doxazosin (CARDURA) 8 MG tablet Take 8 mg by mouth daily.     famotidine (PEPCID) 20 MG tablet Take 20 mg by mouth daily as needed for heartburn or indigestion.     levothyroxine (SYNTHROID) 25 MCG tablet Take 25 mcg by mouth daily.     memantine (NAMENDA) 10 MG tablet Take 1 tablet (10 mg total) by mouth 2 (two) times daily. 60 tablet 0   metolazone (ZAROXOLYN) 2.5 MG tablet Take 1 tablet (2.5 mg total) by mouth every Wednesday. 20 tablet 3   pantoprazole (PROTONIX) 20 MG tablet  Take 20 mg by mouth daily.      potassium chloride SA (KLOR-CON M) 20 MEQ tablet Take 20 mEq by mouth daily.     potassium chloride SA (KLOR-CON M) 20 MEQ tablet Take 20 mEq by mouth daily. Give 2 tablets by mouth in the morning for mineral/supplement     potassium chloride SA (KLOR-CON M) 20 MEQ tablet TAKE 3 TABLETS BY MOUTH DAILY ,  TAKE EXTRA 2 TABLETS BY MOUTH  WHEN YOU TAKE METOLAZONE 299 tablet 3   simvastatin (ZOCOR) 20 MG tablet Take 20 mg by mouth daily.     spironolactone (ALDACTONE) 25 MG tablet Take 0.5 tablets (12.5 mg total) by mouth daily. 45 tablet 1   torsemide (DEMADEX) 20 MG tablet Take 60 mg by mouth 2 (two) times daily. 3 Tablets     VOLTAREN ARTHRITIS PAIN 1 % GEL Apply 2 g topically 3 (three) times daily. Apply to right knee 2 g 0   No current facility-administered medications for this visit.     Physical Exam: Blood pressure (!) 99/50, pulse 67, resp. rate 20, height '5\' 6"'$  (1.676 m), weight 139 lb (63 kg), SpO2 97 %.  Exam Patient has a mildly congested cough Breath sounds with very mild scattered rhonchi bilaterally Heart rhythm regular 2/6 systolic ejection murmur  Diagnostic Tests: Chest x-ray today shows Pleurx catheter in good position with slight  loculated right effusion  Impression: Right pleural effusion appears to be significantly diminished despite no evidence of reaccumulation of the right pleural space.  Probably some mild pleural inflammation/bleeding from the fall has sealed off the site of the fluid.  Plan: Continue weekly Pleurx drainage to confirm that the effusion is resolving Return for reevaluation with chest x-ray and pleural catheter drainage on December 27. Patient given prescription for Mucinex 300 mg twice daily for his congested cough.  [name], MD [facility] [phone]

## 2022-07-03 DIAGNOSIS — M6281 Muscle weakness (generalized): Secondary | ICD-10-CM | POA: Diagnosis not present

## 2022-07-03 DIAGNOSIS — R1312 Dysphagia, oropharyngeal phase: Secondary | ICD-10-CM | POA: Diagnosis not present

## 2022-07-03 DIAGNOSIS — M25561 Pain in right knee: Secondary | ICD-10-CM | POA: Diagnosis not present

## 2022-07-03 DIAGNOSIS — R41841 Cognitive communication deficit: Secondary | ICD-10-CM | POA: Diagnosis not present

## 2022-07-03 DIAGNOSIS — R2681 Unsteadiness on feet: Secondary | ICD-10-CM | POA: Diagnosis not present

## 2022-07-03 DIAGNOSIS — N39498 Other specified urinary incontinence: Secondary | ICD-10-CM | POA: Diagnosis not present

## 2022-07-04 DIAGNOSIS — R41841 Cognitive communication deficit: Secondary | ICD-10-CM | POA: Diagnosis not present

## 2022-07-04 DIAGNOSIS — M6281 Muscle weakness (generalized): Secondary | ICD-10-CM | POA: Diagnosis not present

## 2022-07-04 DIAGNOSIS — R2681 Unsteadiness on feet: Secondary | ICD-10-CM | POA: Diagnosis not present

## 2022-07-04 DIAGNOSIS — R1312 Dysphagia, oropharyngeal phase: Secondary | ICD-10-CM | POA: Diagnosis not present

## 2022-07-04 DIAGNOSIS — M25561 Pain in right knee: Secondary | ICD-10-CM | POA: Diagnosis not present

## 2022-07-04 DIAGNOSIS — N39498 Other specified urinary incontinence: Secondary | ICD-10-CM | POA: Diagnosis not present

## 2022-07-05 DIAGNOSIS — R1312 Dysphagia, oropharyngeal phase: Secondary | ICD-10-CM | POA: Diagnosis not present

## 2022-07-05 DIAGNOSIS — N39498 Other specified urinary incontinence: Secondary | ICD-10-CM | POA: Diagnosis not present

## 2022-07-05 DIAGNOSIS — M6281 Muscle weakness (generalized): Secondary | ICD-10-CM | POA: Diagnosis not present

## 2022-07-05 DIAGNOSIS — M25561 Pain in right knee: Secondary | ICD-10-CM | POA: Diagnosis not present

## 2022-07-05 DIAGNOSIS — R2681 Unsteadiness on feet: Secondary | ICD-10-CM | POA: Diagnosis not present

## 2022-07-05 DIAGNOSIS — R41841 Cognitive communication deficit: Secondary | ICD-10-CM | POA: Diagnosis not present

## 2022-07-06 DIAGNOSIS — M6281 Muscle weakness (generalized): Secondary | ICD-10-CM | POA: Diagnosis not present

## 2022-07-06 DIAGNOSIS — M25561 Pain in right knee: Secondary | ICD-10-CM | POA: Diagnosis not present

## 2022-07-06 DIAGNOSIS — R41841 Cognitive communication deficit: Secondary | ICD-10-CM | POA: Diagnosis not present

## 2022-07-06 DIAGNOSIS — R2681 Unsteadiness on feet: Secondary | ICD-10-CM | POA: Diagnosis not present

## 2022-07-06 DIAGNOSIS — R1312 Dysphagia, oropharyngeal phase: Secondary | ICD-10-CM | POA: Diagnosis not present

## 2022-07-06 DIAGNOSIS — N39498 Other specified urinary incontinence: Secondary | ICD-10-CM | POA: Diagnosis not present

## 2022-07-07 DIAGNOSIS — R2681 Unsteadiness on feet: Secondary | ICD-10-CM | POA: Diagnosis not present

## 2022-07-07 DIAGNOSIS — R41841 Cognitive communication deficit: Secondary | ICD-10-CM | POA: Diagnosis not present

## 2022-07-07 DIAGNOSIS — N39498 Other specified urinary incontinence: Secondary | ICD-10-CM | POA: Diagnosis not present

## 2022-07-07 DIAGNOSIS — R1312 Dysphagia, oropharyngeal phase: Secondary | ICD-10-CM | POA: Diagnosis not present

## 2022-07-07 DIAGNOSIS — M25561 Pain in right knee: Secondary | ICD-10-CM | POA: Diagnosis not present

## 2022-07-07 DIAGNOSIS — M6281 Muscle weakness (generalized): Secondary | ICD-10-CM | POA: Diagnosis not present

## 2022-07-09 DIAGNOSIS — R41841 Cognitive communication deficit: Secondary | ICD-10-CM | POA: Diagnosis not present

## 2022-07-09 DIAGNOSIS — R1312 Dysphagia, oropharyngeal phase: Secondary | ICD-10-CM | POA: Diagnosis not present

## 2022-07-09 DIAGNOSIS — M25561 Pain in right knee: Secondary | ICD-10-CM | POA: Diagnosis not present

## 2022-07-09 DIAGNOSIS — M6281 Muscle weakness (generalized): Secondary | ICD-10-CM | POA: Diagnosis not present

## 2022-07-09 DIAGNOSIS — N39498 Other specified urinary incontinence: Secondary | ICD-10-CM | POA: Diagnosis not present

## 2022-07-09 DIAGNOSIS — R2681 Unsteadiness on feet: Secondary | ICD-10-CM | POA: Diagnosis not present

## 2022-07-10 DIAGNOSIS — R2681 Unsteadiness on feet: Secondary | ICD-10-CM | POA: Diagnosis not present

## 2022-07-10 DIAGNOSIS — R41841 Cognitive communication deficit: Secondary | ICD-10-CM | POA: Diagnosis not present

## 2022-07-10 DIAGNOSIS — M6281 Muscle weakness (generalized): Secondary | ICD-10-CM | POA: Diagnosis not present

## 2022-07-10 DIAGNOSIS — R1312 Dysphagia, oropharyngeal phase: Secondary | ICD-10-CM | POA: Diagnosis not present

## 2022-07-10 DIAGNOSIS — N39498 Other specified urinary incontinence: Secondary | ICD-10-CM | POA: Diagnosis not present

## 2022-07-10 DIAGNOSIS — M25561 Pain in right knee: Secondary | ICD-10-CM | POA: Diagnosis not present

## 2022-07-11 DIAGNOSIS — N39498 Other specified urinary incontinence: Secondary | ICD-10-CM | POA: Diagnosis not present

## 2022-07-11 DIAGNOSIS — M6281 Muscle weakness (generalized): Secondary | ICD-10-CM | POA: Diagnosis not present

## 2022-07-11 DIAGNOSIS — R41841 Cognitive communication deficit: Secondary | ICD-10-CM | POA: Diagnosis not present

## 2022-07-11 DIAGNOSIS — R2681 Unsteadiness on feet: Secondary | ICD-10-CM | POA: Diagnosis not present

## 2022-07-11 DIAGNOSIS — R1312 Dysphagia, oropharyngeal phase: Secondary | ICD-10-CM | POA: Diagnosis not present

## 2022-07-11 DIAGNOSIS — M25561 Pain in right knee: Secondary | ICD-10-CM | POA: Diagnosis not present

## 2022-07-12 DIAGNOSIS — R41841 Cognitive communication deficit: Secondary | ICD-10-CM | POA: Diagnosis not present

## 2022-07-12 DIAGNOSIS — R1312 Dysphagia, oropharyngeal phase: Secondary | ICD-10-CM | POA: Diagnosis not present

## 2022-07-12 DIAGNOSIS — N39498 Other specified urinary incontinence: Secondary | ICD-10-CM | POA: Diagnosis not present

## 2022-07-12 DIAGNOSIS — M6281 Muscle weakness (generalized): Secondary | ICD-10-CM | POA: Diagnosis not present

## 2022-07-12 DIAGNOSIS — M25561 Pain in right knee: Secondary | ICD-10-CM | POA: Diagnosis not present

## 2022-07-12 DIAGNOSIS — R2681 Unsteadiness on feet: Secondary | ICD-10-CM | POA: Diagnosis not present

## 2022-07-13 DIAGNOSIS — R1312 Dysphagia, oropharyngeal phase: Secondary | ICD-10-CM | POA: Diagnosis not present

## 2022-07-13 DIAGNOSIS — M6281 Muscle weakness (generalized): Secondary | ICD-10-CM | POA: Diagnosis not present

## 2022-07-13 DIAGNOSIS — R41841 Cognitive communication deficit: Secondary | ICD-10-CM | POA: Diagnosis not present

## 2022-07-13 DIAGNOSIS — R2681 Unsteadiness on feet: Secondary | ICD-10-CM | POA: Diagnosis not present

## 2022-07-13 DIAGNOSIS — N39498 Other specified urinary incontinence: Secondary | ICD-10-CM | POA: Diagnosis not present

## 2022-07-13 DIAGNOSIS — M25561 Pain in right knee: Secondary | ICD-10-CM | POA: Diagnosis not present

## 2022-07-17 DIAGNOSIS — M25561 Pain in right knee: Secondary | ICD-10-CM | POA: Diagnosis not present

## 2022-07-17 DIAGNOSIS — R1312 Dysphagia, oropharyngeal phase: Secondary | ICD-10-CM | POA: Diagnosis not present

## 2022-07-17 DIAGNOSIS — R2681 Unsteadiness on feet: Secondary | ICD-10-CM | POA: Diagnosis not present

## 2022-07-17 DIAGNOSIS — M6281 Muscle weakness (generalized): Secondary | ICD-10-CM | POA: Diagnosis not present

## 2022-07-17 DIAGNOSIS — N39498 Other specified urinary incontinence: Secondary | ICD-10-CM | POA: Diagnosis not present

## 2022-07-17 DIAGNOSIS — R41841 Cognitive communication deficit: Secondary | ICD-10-CM | POA: Diagnosis not present

## 2022-07-18 ENCOUNTER — Ambulatory Visit: Payer: Medicare Other | Admitting: Cardiothoracic Surgery

## 2022-07-18 DIAGNOSIS — N39498 Other specified urinary incontinence: Secondary | ICD-10-CM | POA: Diagnosis not present

## 2022-07-18 DIAGNOSIS — M6281 Muscle weakness (generalized): Secondary | ICD-10-CM | POA: Diagnosis not present

## 2022-07-18 DIAGNOSIS — R2681 Unsteadiness on feet: Secondary | ICD-10-CM | POA: Diagnosis not present

## 2022-07-18 DIAGNOSIS — R1312 Dysphagia, oropharyngeal phase: Secondary | ICD-10-CM | POA: Diagnosis not present

## 2022-07-18 DIAGNOSIS — M25561 Pain in right knee: Secondary | ICD-10-CM | POA: Diagnosis not present

## 2022-07-18 DIAGNOSIS — R41841 Cognitive communication deficit: Secondary | ICD-10-CM | POA: Diagnosis not present

## 2022-07-19 DIAGNOSIS — R41841 Cognitive communication deficit: Secondary | ICD-10-CM | POA: Diagnosis not present

## 2022-07-19 DIAGNOSIS — R2681 Unsteadiness on feet: Secondary | ICD-10-CM | POA: Diagnosis not present

## 2022-07-19 DIAGNOSIS — R1312 Dysphagia, oropharyngeal phase: Secondary | ICD-10-CM | POA: Diagnosis not present

## 2022-07-19 DIAGNOSIS — N39498 Other specified urinary incontinence: Secondary | ICD-10-CM | POA: Diagnosis not present

## 2022-07-19 DIAGNOSIS — M6281 Muscle weakness (generalized): Secondary | ICD-10-CM | POA: Diagnosis not present

## 2022-07-19 DIAGNOSIS — M25561 Pain in right knee: Secondary | ICD-10-CM | POA: Diagnosis not present

## 2022-07-20 DIAGNOSIS — M25561 Pain in right knee: Secondary | ICD-10-CM | POA: Diagnosis not present

## 2022-07-20 DIAGNOSIS — R1312 Dysphagia, oropharyngeal phase: Secondary | ICD-10-CM | POA: Diagnosis not present

## 2022-07-20 DIAGNOSIS — R41841 Cognitive communication deficit: Secondary | ICD-10-CM | POA: Diagnosis not present

## 2022-07-20 DIAGNOSIS — R2681 Unsteadiness on feet: Secondary | ICD-10-CM | POA: Diagnosis not present

## 2022-07-20 DIAGNOSIS — M6281 Muscle weakness (generalized): Secondary | ICD-10-CM | POA: Diagnosis not present

## 2022-07-20 DIAGNOSIS — N39498 Other specified urinary incontinence: Secondary | ICD-10-CM | POA: Diagnosis not present

## 2022-07-23 DIAGNOSIS — M25561 Pain in right knee: Secondary | ICD-10-CM | POA: Diagnosis not present

## 2022-07-23 DIAGNOSIS — N39498 Other specified urinary incontinence: Secondary | ICD-10-CM | POA: Diagnosis not present

## 2022-07-23 DIAGNOSIS — M6281 Muscle weakness (generalized): Secondary | ICD-10-CM | POA: Diagnosis not present

## 2022-07-23 DIAGNOSIS — R2689 Other abnormalities of gait and mobility: Secondary | ICD-10-CM | POA: Diagnosis not present

## 2022-07-23 DIAGNOSIS — R41841 Cognitive communication deficit: Secondary | ICD-10-CM | POA: Diagnosis not present

## 2022-07-23 DIAGNOSIS — R2681 Unsteadiness on feet: Secondary | ICD-10-CM | POA: Diagnosis not present

## 2022-07-23 DIAGNOSIS — R1312 Dysphagia, oropharyngeal phase: Secondary | ICD-10-CM | POA: Diagnosis not present

## 2022-07-24 ENCOUNTER — Encounter: Payer: Self-pay | Admitting: Cardiothoracic Surgery

## 2022-07-24 ENCOUNTER — Other Ambulatory Visit: Payer: Self-pay | Admitting: *Deleted

## 2022-07-24 ENCOUNTER — Ambulatory Visit
Admission: RE | Admit: 2022-07-24 | Discharge: 2022-07-24 | Disposition: A | Discharge status: Home / Self Care | Payer: Medicare Other | Source: Ambulatory Visit | Attending: Cardiothoracic Surgery | Admitting: Cardiothoracic Surgery

## 2022-07-24 ENCOUNTER — Ambulatory Visit (INDEPENDENT_AMBULATORY_CARE_PROVIDER_SITE_OTHER): Payer: Medicare Other | Admitting: Cardiothoracic Surgery

## 2022-07-24 VITALS — BP 100/51 | HR 66 | Resp 20 | Ht 66.0 in | Wt 139.0 lb

## 2022-07-24 DIAGNOSIS — J90 Pleural effusion, not elsewhere classified: Secondary | ICD-10-CM

## 2022-07-24 DIAGNOSIS — Z9689 Presence of other specified functional implants: Secondary | ICD-10-CM

## 2022-07-24 NOTE — Progress Notes (Signed)
HPI: The patient returns for follow-up of a right Pleurx catheter placed in 2019 for recurrent benign pleural effusions.  After a fall 1 month ago the catheter output has remained scant, possibly related to pleural hematoma and scarring.  The catheter was last drained yesterday at his skilled nursing facility by the RN and was less than 25 cc.  The patient had a chest x-ray today which shows no evidence of significant reaccumulation and is unchanged from his x-ray 1 month ago. Patient denies any symptoms of increased shortness of breath or chest discomfort, his O2 saturation is 95% on room air.  We will arrange for the right Pleurx catheter to be removed in the OR short stay treatment room next week on Tuesday, January 9 using local anesthesia.  The patient is on no significant anticoagulation and the procedure and preparation have been reviewed with the patient and his son. Current Outpatient Medications  Medication Sig Dispense Refill   acetaminophen (TYLENOL) 500 MG tablet Take 2 tablets (1,000 mg total) by mouth in the morning and at bedtime. 30 tablet 0   acetaminophen (TYLENOL) 500 MG tablet Take 2 tablets (1,000 mg total) by mouth daily as needed. 30 tablet 0   allopurinol (ZYLOPRIM) 300 MG tablet Take 150 mg by mouth in the morning. For gout     aspirin 81 MG chewable tablet Chew 81 mg by mouth daily. Chew and swallow tablet     donepezil (ARICEPT ODT) 10 MG disintegrating tablet Take 1 tablet (10 mg total) by mouth at bedtime. For co++ + 90 tablet 1   doxazosin (CARDURA) 8 MG tablet Take 8 mg by mouth daily.     famotidine (PEPCID) 20 MG tablet Take 20 mg by mouth daily as needed for heartburn or indigestion.     levothyroxine (SYNTHROID) 25 MCG tablet Take 25 mcg by mouth daily.     memantine (NAMENDA) 10 MG tablet Take 1 tablet (10 mg total) by mouth 2 (two) times daily. 60 tablet 0   metolazone (ZAROXOLYN) 2.5 MG tablet Take 1 tablet (2.5 mg total) by mouth every Wednesday. 20 tablet 3    pantoprazole (PROTONIX) 20 MG tablet Take 20 mg by mouth daily.      potassium chloride SA (KLOR-CON M) 20 MEQ tablet Take 20 mEq by mouth daily.     potassium chloride SA (KLOR-CON M) 20 MEQ tablet Take 20 mEq by mouth daily. Give 2 tablets by mouth in the morning for mineral/supplement     potassium chloride SA (KLOR-CON M) 20 MEQ tablet TAKE 3 TABLETS BY MOUTH DAILY ,  TAKE EXTRA 2 TABLETS BY MOUTH  WHEN YOU TAKE METOLAZONE 299 tablet 3   simvastatin (ZOCOR) 20 MG tablet Take 20 mg by mouth daily.     spironolactone (ALDACTONE) 25 MG tablet Take 0.5 tablets (12.5 mg total) by mouth daily. 45 tablet 1   torsemide (DEMADEX) 20 MG tablet Take 60 mg by mouth 2 (two) times daily. 3 Tablets     VOLTAREN ARTHRITIS PAIN 1 % GEL Apply 2 g topically 3 (three) times daily. Apply to right knee 2 g 0   No current facility-administered medications for this visit.     Physical Exam:  Blood pressure (!) 100/51, pulse 66, resp. rate 20, height '5\' 6"'$  (1.676 m), weight 139 lb (63 kg), SpO2 95 %.         Exam    General- alert and comfortable    Neck- no JVD, no cervical adenopathy palpable, no  carotid bruit   Lungs- clear without rales, wheezes.  Right Pleurx catheter dressing dry and intact.   Cor- regular rate and rhythm, stable 2/6 flow murmur without, gallop   Abdomen- soft, non-tender   Extremities - warm, non-tender, minimal, chronic bilateral lower leg edema   Neuro- oriented, appropriate, no focal weakness  Diagnostic Tests: Chest x-ray images today personally reviewed showing no reaccumulation of right pleural effusion.  Right pleural catheter is in good position.  There is scant fluid in the interlobar greater fissure.  Impression: History recurrent bilateral pleural effusions treated with bilateral Pleurx catheters.  The remaining right Pleurx catheter is now ready to be removed under local anesthesia in outpatient treatment room in short stay at [facility].  This will be scheduled  for Tuesday early afternoon January 9.  Plan: Patient the patient will return for suture removal with chest x-ray in about 1 week after catheter removal.   [name], MD [facility] [phone]

## 2022-07-25 ENCOUNTER — Other Ambulatory Visit: Payer: Self-pay | Admitting: Orthopedic Surgery

## 2022-07-25 ENCOUNTER — Other Ambulatory Visit: Payer: Self-pay

## 2022-07-25 DIAGNOSIS — R2681 Unsteadiness on feet: Secondary | ICD-10-CM | POA: Diagnosis not present

## 2022-07-25 DIAGNOSIS — M6281 Muscle weakness (generalized): Secondary | ICD-10-CM | POA: Diagnosis not present

## 2022-07-25 DIAGNOSIS — R41841 Cognitive communication deficit: Secondary | ICD-10-CM | POA: Diagnosis not present

## 2022-07-25 DIAGNOSIS — R1312 Dysphagia, oropharyngeal phase: Secondary | ICD-10-CM | POA: Diagnosis not present

## 2022-07-25 DIAGNOSIS — F03B0 Unspecified dementia, moderate, without behavioral disturbance, psychotic disturbance, mood disturbance, and anxiety: Secondary | ICD-10-CM

## 2022-07-25 DIAGNOSIS — M25561 Pain in right knee: Secondary | ICD-10-CM | POA: Diagnosis not present

## 2022-07-25 DIAGNOSIS — N39498 Other specified urinary incontinence: Secondary | ICD-10-CM | POA: Diagnosis not present

## 2022-07-25 MED ORDER — MEMANTINE HCL 10 MG PO TABS: 10.0000 mg | ORAL_TABLET | Freq: Two times a day (BID) | ORAL | 1 refills | Status: DC

## 2022-07-26 ENCOUNTER — Non-Acute Institutional Stay: Payer: Medicare Other | Admitting: Internal Medicine

## 2022-07-26 DIAGNOSIS — N3941 Urge incontinence: Secondary | ICD-10-CM

## 2022-07-26 DIAGNOSIS — F03B0 Unspecified dementia, moderate, without behavioral disturbance, psychotic disturbance, mood disturbance, and anxiety: Secondary | ICD-10-CM

## 2022-07-26 DIAGNOSIS — F331 Major depressive disorder, recurrent, moderate: Secondary | ICD-10-CM | POA: Diagnosis not present

## 2022-07-26 DIAGNOSIS — M6281 Muscle weakness (generalized): Secondary | ICD-10-CM | POA: Diagnosis not present

## 2022-07-26 DIAGNOSIS — J90 Pleural effusion, not elsewhere classified: Secondary | ICD-10-CM | POA: Diagnosis not present

## 2022-07-26 DIAGNOSIS — E039 Hypothyroidism, unspecified: Secondary | ICD-10-CM

## 2022-07-26 DIAGNOSIS — M25561 Pain in right knee: Secondary | ICD-10-CM

## 2022-07-26 DIAGNOSIS — I5032 Chronic diastolic (congestive) heart failure: Secondary | ICD-10-CM

## 2022-07-26 DIAGNOSIS — R2681 Unsteadiness on feet: Secondary | ICD-10-CM | POA: Diagnosis not present

## 2022-07-26 DIAGNOSIS — R41841 Cognitive communication deficit: Secondary | ICD-10-CM | POA: Diagnosis not present

## 2022-07-26 DIAGNOSIS — N39498 Other specified urinary incontinence: Secondary | ICD-10-CM | POA: Diagnosis not present

## 2022-07-26 DIAGNOSIS — R1312 Dysphagia, oropharyngeal phase: Secondary | ICD-10-CM | POA: Diagnosis not present

## 2022-07-26 DIAGNOSIS — G8929 Other chronic pain: Secondary | ICD-10-CM | POA: Diagnosis not present

## 2022-07-26 DIAGNOSIS — N400 Enlarged prostate without lower urinary tract symptoms: Secondary | ICD-10-CM

## 2022-07-26 NOTE — Progress Notes (Signed)
Location: [facility] of Service:  ALF (13)  Provider:   Code Status: DNR Goals of Care:     06/05/2022   12:36 PM  Advanced Directives  Does Patient Have a Medical Advance Directive? Yes  Type of Paramedic of [place];Living will;Out of facility DNR (pink MOST or yellow form)  Does patient want to make changes to medical advance directive? No - Patient declined  Copy of [place] in Chart? Yes - validated most recent copy scanned in chart (See row information)  Pre-existing out of facility DNR order (yellow form or pink MOST form) Pink Most/Yellow Form available - Physician notified to receive inpatient order;Yellow form placed in chart (order not valid for inpatient use)     Chief Complaint  Patient presents with  . Acute Visit    HPI: Patient is a 89 y.o. male seen today for an acute visit for Depression and Worsenign Dementia  Lives in [place] Patient  has h/o  CHF Followed By Dr [name] 2 D echo has shown EF of 55%. I  Moderate Aortic sclerosis   Recurent Pleural Effusion 2017 Follows with Dr Lucianne Lei Tright Suspected Yellow Nail Syndrome S/p Bilateral Pleurex Tubes. But Now has right Catheter. Drains 1/week.  Plan to remove the tube as drainage has reduced    Dementia Has Progressesed Patient had worsening aphasia. Son very concerned.  Patient has not had detailed workup like MRI and has not seen neurology before.  Patient's son wanted to know if he is having small stroke.  His CT in the past that showed chronic vascular disease Depression Patient lost his wife a year ago.  Recently has been more depressed stays mostly in his room has lost weight appetite is poor Will make stent allergy incontinence  Past Medical History:  Diagnosis Date  . Actinic keratoses   . Arthritis   . CHF (congestive heart failure) ([place])   . Chronic bilateral pleural effusions   . Chronic sinusitis   . Colon polyps   . Dyspnea  07/13/2013   BNP normal  . Edema 07/13/2013  . GERD (gastroesophageal reflux disease)    omeprazole 1-2 times daily  . H/O TIA (transient ischemic attack) and stroke    on statins and plavix -saw Dr.Sethi in the past  . Headache   . History of BPH   . History of TIA (transient ischemic attack) 07/13/2013  . Left inguinal hernia   . Left inguinal hernia 01/27/2019  . Lumbar radiculopathy   . Need for shingles vaccine   . Optic neuritis 10/2015   left eye  . Perennial allergic rhinitis   . Polymyalgia rheumatica ([place]) 07/13/2013   Daily prednisone  . Restless leg syndrome   . Retinal disease    right eye since birth   . Shingles   . Temporal arteritis ([place]) 10/2015  . Wears glasses   . 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 BRAVO [place] STUDY N/A 04/18/2016   Procedure: BRAVO [place];  Surgeon: Wilford Corner, MD;  Location: [facility];  Service: Endoscopy;  Laterality: N/A;  . CHEST TUBE INSERTION Right 02/13/2018   Procedure: INSERTION PLEURAL DRAINAGE CATHETER;  Surgeon: Ivin Poot, MD;  Location: [facility];  Service: Thoracic;  Laterality: Right;  . CHEST TUBE INSERTION Left 11/27/2019   Procedure: REDO INSERTION PLEURAL DRAINAGE CATHETER USING 15.5 FR. [place] CATH;  Surgeon: [name], Collier Salina, MD;  Location: [facility];  Service: Thoracic;  Laterality: Left;  . COLONOSCOPY    . drropy eyelid surgery    . ESOPHAGOGASTRODUODENOSCOPY (EGD) WITH PROPOFOL N/A 04/18/2016   Procedure: ESOPHAGOGASTRODUODENOSCOPY (EGD) WITH PROPOFOL;  Surgeon: Wilford Corner, MD;  Location: [facility];  Service: Endoscopy;  Laterality: N/A;  . INGUINAL HERNIA REPAIR Left 01/27/2019   Procedure: OPEN REPAIR LEFT INGUINAL HERNIA WITH MESH;  Surgeon: Fanny Skates, MD;  Location: [facility];  Service: General;  Laterality: Left;  GENERAL AND TAP BLOCK ANESTHESIA  . INSERTION OF MESH Left 01/27/2019   Procedure: Insertion Of Mesh;  Surgeon: Fanny Skates, MD;   Location: [facility];  Service: General;  Laterality: Left;  . IR THORACENTESIS ASP PLEURAL SPACE W/IMG GUIDE  02/04/2018  . IR THORACENTESIS ASP PLEURAL SPACE W/IMG GUIDE  02/06/2018  . REMOVAL OF PLEURAL DRAINAGE CATHETER Left 11/27/2019   Procedure: REMOVAL OF PLEURAL DRAINAGE CATHETER;  Surgeon: Ivin Poot, MD;  Location: [facility];  Service: Thoracic;  Laterality: Left;  . REMOVAL OF PLEURAL DRAINAGE CATHETER Left 03/31/2020   Procedure: REMOVAL OF PLEURAL DRAINAGE CATHETER;  Surgeon: Ivin Poot, MD;  Location: [facility];  Service: Thoracic;  Laterality: Left;  . RIGHT/LEFT HEART CATH AND CORONARY ANGIOGRAPHY N/A 06/24/2018   Procedure: RIGHT/LEFT HEART CATH AND CORONARY ANGIOGRAPHY;  Surgeon: Jolaine Artist, MD;  Location: [facility];  Service: Cardiovascular;  Laterality: N/A;  . VASECTOMY      No Known Allergies  Outpatient Encounter Medications as of 07/26/2022  Medication Sig  . acetaminophen (TYLENOL) 500 MG tablet Take 2 tablets (1,000 mg total) by mouth in the morning and at bedtime.  Marland Kitchen acetaminophen (TYLENOL) 500 MG tablet Take 2 tablets (1,000 mg total) by mouth daily as needed.  Marland Kitchen allopurinol (ZYLOPRIM) 300 MG tablet Take 150 mg by mouth in the morning. For gout  . aspirin 81 MG chewable tablet Chew 81 mg by mouth daily. Chew and swallow tablet  . donepezil (ARICEPT ODT) 10 MG disintegrating tablet Take 1 tablet (10 mg total) by mouth at bedtime. For co++ +  . doxazosin (CARDURA) 8 MG tablet Take 8 mg by mouth daily.  . famotidine (PEPCID) 20 MG tablet Take 20 mg by mouth daily as needed for heartburn or indigestion.  Marland Kitchen levothyroxine (SYNTHROID) 25 MCG tablet Take 25 mcg by mouth daily.  . memantine (NAMENDA) 10 MG tablet Take 1 tablet (10 mg total) by mouth 2 (two) times daily.  . metolazone (ZAROXOLYN) 2.5 MG tablet Take 1 tablet (2.5 mg total) by mouth every Wednesday.  . pantoprazole (PROTONIX) 20 MG tablet Take 20 mg by mouth daily.   . potassium chloride SA  (KLOR-CON M) 20 MEQ tablet Take 20 mEq by mouth daily.  . potassium chloride SA (KLOR-CON M) 20 MEQ tablet Take 20 mEq by mouth daily. Give 2 tablets by mouth in the morning for mineral/supplement  . potassium chloride SA (KLOR-CON M) 20 MEQ tablet TAKE 3 TABLETS BY MOUTH DAILY ,  TAKE EXTRA 2 TABLETS BY MOUTH  WHEN YOU TAKE METOLAZONE  . simvastatin (ZOCOR) 20 MG tablet Take 20 mg by mouth daily.  Marland Kitchen spironolactone (ALDACTONE) 25 MG tablet Take 0.5 tablets (12.5 mg total) by mouth daily.  Marland Kitchen torsemide (DEMADEX) 20 MG tablet Take 60 mg by mouth 2 (two) times daily. 3 Tablets  . VOLTAREN ARTHRITIS PAIN 1 % GEL Apply 2 g topically 3 (three) times daily. Apply to right knee   No facility-administered encounter medications on  file as of 07/26/2022.    Review of Systems:  Review of Systems  Health Maintenance  Topic Date Due  . Zoster Vaccines- Shingrix (2 of 2) 04/15/2019  . INFLUENZA VACCINE  02/20/2022  . COVID-19 Vaccine (6 - 2023-24 season) 03/23/2022  . Medicare Annual Wellness (AWV)  03/20/2023  . DTaP/Tdap/Td (3 - Td or Tdap) 08/07/2028  . Pneumonia Vaccine 56+ Years old  Completed  . HPV VACCINES  Aged Out    Physical Exam: There were no vitals filed for this visit. There is no height or weight on file to calculate BMI. Physical Exam  Labs reviewed: Basic Metabolic Panel: Recent Labs    09/14/21 1411 11/06/21 0000  NA 138 143  K 3.5 3.9  CL 99 106  CO2 29 31*  GLUCOSE 118*  --   BUN 28* 33*  CREATININE 1.50* 1.2  CALCIUM 8.4* 8.2*  TSH  --  3.07   Liver Function Tests: Recent Labs    11/06/21 0000  AST 11*  ALT 5*  ALKPHOS 48  ALBUMIN 3.3*   No results for input(s): "LIPASE", "AMYLASE" in the last 8760 hours. No results for input(s): "AMMONIA" in the last 8760 hours. CBC: Recent Labs    11/06/21 0000  WBC 7.1  HGB 12.7*  HCT 39*  PLT 308   Lipid Panel: No results for input(s): "CHOL", "HDL", "LDLCALC", "TRIG", "CHOLHDL", "LDLDIRECT" in the last 8760  hours. No results found for: "HGBA1C"  Procedures since last visit: DG Chest 2 View  Result Date: 07/24/2022 CLINICAL DATA:  Pleural effusion. EXAM: CHEST - 2 VIEW COMPARISON:  July 02, 2022. FINDINGS: The heart size and mediastinal contours are within normal limits. Stable small loculated pleural effusions are noted with associated bibasilar atelectasis or scarring. Stable right-sided pleural drainage catheter is noted. The visualized skeletal structures are unremarkable. IMPRESSION: Stable right-sided pleural drainage catheter. Stable small loculated pleural effusions are noted with associated bibasilar atelectasis or scarring. Electronically Signed   By: Marijo Conception M.D.   On: 07/24/2022 13:20   DG Chest 2 View  Result Date: 07/02/2022 CLINICAL DATA:  Chronic pleural effusion. Shortness of breath. PleurX in place. EXAM: CHEST - 2 VIEW COMPARISON:  Radiographs 06/25/2022 and 08/21/2021.  CT 11/17/2020. FINDINGS: Unchanged PleurX catheter posteriorly and inferiorly in the right pleural space. Partially loculated bilateral pleural effusions are similar to the most recent radiographs, although on the right there may be more extension into the inferior aspect of the major fissure. No pneumothorax. The heart size and mediastinal contours are stable. The bones appear unremarkable. IMPRESSION: Possible interval increased fluid inferiorly in the right major fissure. Otherwise similar appearance of partially loculated bilateral pleural effusions with right-sided PleurX catheter in place. No pneumothorax. Electronically Signed   By: Richardean Sale M.D.   On: 07/02/2022 10:55    Assessment/Plan There are no diagnoses linked to this encounter.   Labs/tests ordered:  * No order type specified * Next appt:  Visit date not found

## 2022-07-27 ENCOUNTER — Encounter: Payer: Self-pay | Admitting: Internal Medicine

## 2022-07-27 ENCOUNTER — Other Ambulatory Visit: Payer: Self-pay | Admitting: Orthopedic Surgery

## 2022-07-27 ENCOUNTER — Other Ambulatory Visit: Payer: Self-pay | Admitting: *Deleted

## 2022-07-27 DIAGNOSIS — M25561 Pain in right knee: Secondary | ICD-10-CM | POA: Diagnosis not present

## 2022-07-27 DIAGNOSIS — N39498 Other specified urinary incontinence: Secondary | ICD-10-CM | POA: Diagnosis not present

## 2022-07-27 DIAGNOSIS — N3941 Urge incontinence: Secondary | ICD-10-CM

## 2022-07-27 DIAGNOSIS — R1312 Dysphagia, oropharyngeal phase: Secondary | ICD-10-CM | POA: Diagnosis not present

## 2022-07-27 DIAGNOSIS — R2681 Unsteadiness on feet: Secondary | ICD-10-CM | POA: Diagnosis not present

## 2022-07-27 DIAGNOSIS — M6281 Muscle weakness (generalized): Secondary | ICD-10-CM | POA: Diagnosis not present

## 2022-07-27 DIAGNOSIS — F331 Major depressive disorder, recurrent, moderate: Secondary | ICD-10-CM

## 2022-07-27 DIAGNOSIS — R41841 Cognitive communication deficit: Secondary | ICD-10-CM | POA: Diagnosis not present

## 2022-07-27 MED ORDER — ESCITALOPRAM OXALATE 10 MG PO TABS: 10.0000 mg | ORAL_TABLET | Freq: Every day | ORAL | 6 refills | Status: DC

## 2022-07-27 MED ORDER — DOXAZOSIN MESYLATE 8 MG PO TABS: 4.0000 mg | ORAL_TABLET | Freq: Every day | ORAL | 2 refills | Status: DC

## 2022-07-27 NOTE — Telephone Encounter (Signed)
Pharmacy requested refill Pended Rx and sent to Dr. Gupta for approval.  

## 2022-07-30 DIAGNOSIS — M6281 Muscle weakness (generalized): Secondary | ICD-10-CM | POA: Diagnosis not present

## 2022-07-30 DIAGNOSIS — R41841 Cognitive communication deficit: Secondary | ICD-10-CM | POA: Diagnosis not present

## 2022-07-30 DIAGNOSIS — R1312 Dysphagia, oropharyngeal phase: Secondary | ICD-10-CM | POA: Diagnosis not present

## 2022-07-30 DIAGNOSIS — R2681 Unsteadiness on feet: Secondary | ICD-10-CM | POA: Diagnosis not present

## 2022-07-30 DIAGNOSIS — M25561 Pain in right knee: Secondary | ICD-10-CM | POA: Diagnosis not present

## 2022-07-30 DIAGNOSIS — N39498 Other specified urinary incontinence: Secondary | ICD-10-CM | POA: Diagnosis not present

## 2022-07-31 ENCOUNTER — Encounter (HOSPITAL_COMMUNITY)
Admission: RE | Disposition: A | Discharge status: Home / Self Care | Payer: Self-pay | Source: Home / Self Care | Attending: Cardiothoracic Surgery

## 2022-07-31 ENCOUNTER — Ambulatory Visit (HOSPITAL_COMMUNITY)
Admission: RE | Admit: 2022-07-31 | Discharge: 2022-07-31 | Disposition: A | Discharge status: Home / Self Care | Payer: Medicare Other | Attending: Cardiothoracic Surgery | Admitting: Cardiothoracic Surgery

## 2022-07-31 DIAGNOSIS — J90 Pleural effusion, not elsewhere classified: Secondary | ICD-10-CM | POA: Diagnosis not present

## 2022-07-31 DIAGNOSIS — N39498 Other specified urinary incontinence: Secondary | ICD-10-CM | POA: Diagnosis not present

## 2022-07-31 DIAGNOSIS — R1312 Dysphagia, oropharyngeal phase: Secondary | ICD-10-CM | POA: Diagnosis not present

## 2022-07-31 DIAGNOSIS — M25561 Pain in right knee: Secondary | ICD-10-CM | POA: Diagnosis not present

## 2022-07-31 DIAGNOSIS — Z9689 Presence of other specified functional implants: Secondary | ICD-10-CM

## 2022-07-31 DIAGNOSIS — M6281 Muscle weakness (generalized): Secondary | ICD-10-CM | POA: Diagnosis not present

## 2022-07-31 DIAGNOSIS — R2681 Unsteadiness on feet: Secondary | ICD-10-CM | POA: Diagnosis not present

## 2022-07-31 DIAGNOSIS — R41841 Cognitive communication deficit: Secondary | ICD-10-CM | POA: Diagnosis not present

## 2022-07-31 SURGERY — REMOVAL, CLOSED DRAINAGE CATHETER SYSTEM, PLEURAL
Anesthesia: Monitor Anesthesia Care | Laterality: Right

## 2022-07-31 MED ORDER — LIDOCAINE HCL (PF) 1 % IJ SOLN
INTRAMUSCULAR | Status: AC
  Filled 2022-07-31: qty 30

## 2022-07-31 NOTE — Progress Notes (Signed)
Vital signs stable at discharge. Discharged to [facility] with son. Procedure site is clean, dry and intact. No hematoma or bleeding present on dressing. Follow up appointment on 08/06/22 at 1:30pm.

## 2022-07-31 NOTE — H&P (Signed)
HPI: The patient returns for follow-up of a right Pleurx catheter placed in 2019 for recurrent benign pleural effusions.  After a fall 1 month ago the catheter output has remained scant, possibly related to pleural hematoma and scarring.  The catheter was last drained yesterday at his skilled nursing facility by the RN and was less than 25 cc.  The patient had a chest x-ray today which shows no evidence of significant reaccumulation and is unchanged from his x-ray 1 month ago. Patient denies any symptoms of increased shortness of breath or chest discomfort, his O2 saturation is 95% on room air.  We will arrange for the right Pleurx catheter to be removed in the OR short stay treatment room next week on Tuesday, January 9 using local anesthesia.  The patient is on no significant anticoagulation and the procedure and preparation have been reviewed with the patient and his son. Current Facility-Administered Medications  Medication Dose Route Frequency Provider Last Rate Last Admin   lidocaine (PF) (XYLOCAINE) 1 % injection              Physical Exam:  Blood pressure (!) 100/51, pulse 66, resp. rate 20, height '5\' 6"'$  (1.676 m), weight 139 lb (63 kg), SpO2 95 %.         Exam    General- alert and comfortable    Neck- no JVD, no cervical adenopathy palpable, no carotid bruit   Lungs- clear without rales, wheezes.  Right Pleurx catheter dressing dry and intact.   Cor- regular rate and rhythm, stable 2/6 flow murmur without, gallop   Abdomen- soft, non-tender   Extremities - warm, non-tender, minimal, chronic bilateral lower leg edema   Neuro- oriented, appropriate, no focal weakness  Diagnostic Tests: Chest x-ray images today personally reviewed showing no reaccumulation of right pleural effusion.  Right pleural catheter is in good position.  There is scant fluid in the interlobar greater fissure.  Impression: History recurrent bilateral pleural effusions treated with bilateral Pleurx  catheters.  The remaining right Pleurx catheter is now ready to be removed under local anesthesia in outpatient treatment room in short stay at [facility].  This will be scheduled for Tuesday early afternoon January 9.  Plan: Patient the patient will return for suture removal with chest x-ray in about 1 week after catheter removal.   Stuart Byes, MD [facility] [phone]  07-31-22  Blood pressure (!) 106/49, pulse 68, temperature 98.6 F (37 C), temperature source Oral, resp. rate 16, height '5\' 6"'$  (1.676 m), weight 56.2 kg, SpO2 96 %.   No change in exam Pre Procedure note for inpatients:   Stuart Watson has been scheduled for Procedure(s): REMOVAL OF RIGHT PLEURAL DRAINAGE CATHETER (Right) today. The various methods of treatment have been discussed with the patient. After consideration of the risks, benefits and treatment options the patient has consented to the planned procedure.   The patient has been seen and labs reviewed. There are no changes in the patient's condition to prevent proceeding with the planned procedure today.  Recent labs: CXR last week demonstrates no significant right pleural effusion with R pleurx catheter in place   Stuart Byes, MD 07/31/2022 11:35 AM

## 2022-07-31 NOTE — Procedures (Addendum)
Stuart Watson [id number] Feb 11, 1932   Pre Procedure Diagnosis: Right Pleural Effusion   Procedure: Removal of Right Sided Pleur-x drainage catheter  Procedure:  Patient and correct side identified.  The patient's right chest was prepped and draped in sterile fashion.  He underwent local anesthesia with 1% Xylocaine with 20 cc.  Once anesthesia achieved the patient's cuff was visible outside of the skin.  There was a visible skin band that was dissected.  This released the chest drainage catheter and was removed without difficulty.  The incision was close with 2 interruped 3-0 Nylon suture.  The patient tolerated the procedure without difficulty.  Blood loss: Minimal  Condition: Tolerated procedure without difficulty  Follow up in our office on 1/15 with CXR prior to appointment  Ellwood Handler, PA-C 12:05 PM 07/31/22    patient examined and medical record reviewed,agree with above note. [name] 07/31/2022

## 2022-08-01 DIAGNOSIS — R1312 Dysphagia, oropharyngeal phase: Secondary | ICD-10-CM | POA: Diagnosis not present

## 2022-08-01 DIAGNOSIS — M25561 Pain in right knee: Secondary | ICD-10-CM | POA: Diagnosis not present

## 2022-08-01 DIAGNOSIS — R41841 Cognitive communication deficit: Secondary | ICD-10-CM | POA: Diagnosis not present

## 2022-08-01 DIAGNOSIS — R2681 Unsteadiness on feet: Secondary | ICD-10-CM | POA: Diagnosis not present

## 2022-08-01 DIAGNOSIS — M6281 Muscle weakness (generalized): Secondary | ICD-10-CM | POA: Diagnosis not present

## 2022-08-01 DIAGNOSIS — N39498 Other specified urinary incontinence: Secondary | ICD-10-CM | POA: Diagnosis not present

## 2022-08-02 DIAGNOSIS — R1312 Dysphagia, oropharyngeal phase: Secondary | ICD-10-CM | POA: Diagnosis not present

## 2022-08-02 DIAGNOSIS — R2681 Unsteadiness on feet: Secondary | ICD-10-CM | POA: Diagnosis not present

## 2022-08-02 DIAGNOSIS — N39498 Other specified urinary incontinence: Secondary | ICD-10-CM | POA: Diagnosis not present

## 2022-08-02 DIAGNOSIS — M6281 Muscle weakness (generalized): Secondary | ICD-10-CM | POA: Diagnosis not present

## 2022-08-02 DIAGNOSIS — M25561 Pain in right knee: Secondary | ICD-10-CM | POA: Diagnosis not present

## 2022-08-02 DIAGNOSIS — R41841 Cognitive communication deficit: Secondary | ICD-10-CM | POA: Diagnosis not present

## 2022-08-03 DIAGNOSIS — N39498 Other specified urinary incontinence: Secondary | ICD-10-CM | POA: Diagnosis not present

## 2022-08-03 DIAGNOSIS — R1312 Dysphagia, oropharyngeal phase: Secondary | ICD-10-CM | POA: Diagnosis not present

## 2022-08-03 DIAGNOSIS — R2681 Unsteadiness on feet: Secondary | ICD-10-CM | POA: Diagnosis not present

## 2022-08-03 DIAGNOSIS — M25561 Pain in right knee: Secondary | ICD-10-CM | POA: Diagnosis not present

## 2022-08-03 DIAGNOSIS — R41841 Cognitive communication deficit: Secondary | ICD-10-CM | POA: Diagnosis not present

## 2022-08-03 DIAGNOSIS — M6281 Muscle weakness (generalized): Secondary | ICD-10-CM | POA: Diagnosis not present

## 2022-08-06 ENCOUNTER — Ambulatory Visit: Payer: Medicare Other | Admitting: Cardiothoracic Surgery

## 2022-08-06 ENCOUNTER — Telehealth: Payer: Medicare Other | Admitting: Cardiothoracic Surgery

## 2022-08-06 DIAGNOSIS — R41841 Cognitive communication deficit: Secondary | ICD-10-CM | POA: Diagnosis not present

## 2022-08-06 DIAGNOSIS — R2681 Unsteadiness on feet: Secondary | ICD-10-CM | POA: Diagnosis not present

## 2022-08-06 DIAGNOSIS — M6281 Muscle weakness (generalized): Secondary | ICD-10-CM | POA: Diagnosis not present

## 2022-08-06 DIAGNOSIS — R1312 Dysphagia, oropharyngeal phase: Secondary | ICD-10-CM | POA: Diagnosis not present

## 2022-08-06 DIAGNOSIS — M25561 Pain in right knee: Secondary | ICD-10-CM | POA: Diagnosis not present

## 2022-08-06 DIAGNOSIS — N39498 Other specified urinary incontinence: Secondary | ICD-10-CM | POA: Diagnosis not present

## 2022-08-07 DIAGNOSIS — R2681 Unsteadiness on feet: Secondary | ICD-10-CM | POA: Diagnosis not present

## 2022-08-07 DIAGNOSIS — M6281 Muscle weakness (generalized): Secondary | ICD-10-CM | POA: Diagnosis not present

## 2022-08-07 DIAGNOSIS — R41841 Cognitive communication deficit: Secondary | ICD-10-CM | POA: Diagnosis not present

## 2022-08-07 DIAGNOSIS — R1312 Dysphagia, oropharyngeal phase: Secondary | ICD-10-CM | POA: Diagnosis not present

## 2022-08-07 DIAGNOSIS — N39498 Other specified urinary incontinence: Secondary | ICD-10-CM | POA: Diagnosis not present

## 2022-08-07 DIAGNOSIS — M25561 Pain in right knee: Secondary | ICD-10-CM | POA: Diagnosis not present

## 2022-08-08 ENCOUNTER — Non-Acute Institutional Stay: Payer: Medicare Other | Admitting: Orthopedic Surgery

## 2022-08-08 ENCOUNTER — Encounter: Payer: Self-pay | Admitting: Orthopedic Surgery

## 2022-08-08 ENCOUNTER — Other Ambulatory Visit: Payer: Self-pay | Admitting: Cardiothoracic Surgery

## 2022-08-08 DIAGNOSIS — F331 Major depressive disorder, recurrent, moderate: Secondary | ICD-10-CM

## 2022-08-08 DIAGNOSIS — N400 Enlarged prostate without lower urinary tract symptoms: Secondary | ICD-10-CM

## 2022-08-08 DIAGNOSIS — J90 Pleural effusion, not elsewhere classified: Secondary | ICD-10-CM

## 2022-08-08 DIAGNOSIS — M6281 Muscle weakness (generalized): Secondary | ICD-10-CM | POA: Diagnosis not present

## 2022-08-08 DIAGNOSIS — M25561 Pain in right knee: Secondary | ICD-10-CM

## 2022-08-08 DIAGNOSIS — F03B0 Unspecified dementia, moderate, without behavioral disturbance, psychotic disturbance, mood disturbance, and anxiety: Secondary | ICD-10-CM | POA: Diagnosis not present

## 2022-08-08 DIAGNOSIS — N39498 Other specified urinary incontinence: Secondary | ICD-10-CM | POA: Diagnosis not present

## 2022-08-08 DIAGNOSIS — R2681 Unsteadiness on feet: Secondary | ICD-10-CM

## 2022-08-08 DIAGNOSIS — G8929 Other chronic pain: Secondary | ICD-10-CM | POA: Diagnosis not present

## 2022-08-08 DIAGNOSIS — I5032 Chronic diastolic (congestive) heart failure: Secondary | ICD-10-CM | POA: Diagnosis not present

## 2022-08-08 DIAGNOSIS — E039 Hypothyroidism, unspecified: Secondary | ICD-10-CM

## 2022-08-08 DIAGNOSIS — K219 Gastro-esophageal reflux disease without esophagitis: Secondary | ICD-10-CM

## 2022-08-08 DIAGNOSIS — M1A9XX0 Chronic gout, unspecified, without tophus (tophi): Secondary | ICD-10-CM

## 2022-08-08 DIAGNOSIS — N3941 Urge incontinence: Secondary | ICD-10-CM

## 2022-08-08 DIAGNOSIS — R41841 Cognitive communication deficit: Secondary | ICD-10-CM | POA: Diagnosis not present

## 2022-08-08 DIAGNOSIS — R1312 Dysphagia, oropharyngeal phase: Secondary | ICD-10-CM | POA: Diagnosis not present

## 2022-08-08 NOTE — Progress Notes (Addendum)
[address]       [place], [address]             [phone]  HPI: This is a 89 year old male who had placement of bilateral PleurX catheters for recurrent pleural effusions placed on 02/13/2018.  Left pleur X catheter  was non functioning and was replaced on 05/07/021. Most recently, right pleur X catheter was removed on 07/31/2022. He presents today for follow up after removal. Per patient, his breathing is "pretty good".   Current Outpatient Medications  Medication Sig Dispense Refill   acetaminophen (TYLENOL) 500 MG tablet Take 2 tablets (1,000 mg total) by mouth in the morning and at bedtime. 30 tablet 0   allopurinol (ZYLOPRIM) 300 MG tablet Take 150 mg by mouth in the morning. For gout     aspirin 81 MG chewable tablet Chew 81 mg by mouth in the morning. Chew and swallow tablet     donepezil (ARICEPT ODT) 10 MG disintegrating tablet Take 1 tablet (10 mg total) by mouth at bedtime. For co++ + 90 tablet 1   doxazosin (CARDURA) 8 MG tablet Take 0.5 tablets (4 mg total) by mouth daily. 90 tablet 2   escitalopram (LEXAPRO) 10 MG tablet Take 1 tablet (10 mg total) by mouth daily. Take 1/2 tablet po daily x 7 days, then increase to 1 tablet daily. 30 tablet 6   famotidine (PEPCID) 20 MG tablet Take 20 mg by mouth daily as needed for indigestion.     guaiFENesin (MUCINEX) 600 MG 12 hr tablet Take 600 mg by mouth 2 (two) times daily as needed for cough.     levothyroxine (SYNTHROID) 25 MCG tablet Take 25 mcg by mouth daily before breakfast.     memantine (NAMENDA) 10 MG tablet Take 1 tablet (10 mg total) by mouth 2 (two) times daily. 180 tablet 1   metolazone (ZAROXOLYN) 2.5 MG tablet Take 1 tablet (2.5 mg total) by mouth every Wednesday. 20 tablet 3   neomycin-bacitracin-polymyxin (NEOSPORIN) OINT Apply 1 Application topically every Monday. Applied to exit after draining right pleural catheter.     pantoprazole (PROTONIX) 20 MG tablet Take 20 mg by mouth daily before  breakfast.     potassium chloride SA (KLOR-CON M) 20 MEQ tablet Take 20-40 mEq by mouth See admin instructions. Take 2 tablets (40 meq) by mouth in the morning & take 1 tablet (20 meq) by mouth at bedtime.     simvastatin (ZOCOR) 20 MG tablet Take 20 mg by mouth every evening.     spironolactone (ALDACTONE) 25 MG tablet Take 0.5 tablets (12.5 mg total) by mouth daily. 45 tablet 1   torsemide (DEMADEX) 20 MG tablet Take 60 mg by mouth 2 (two) times daily.     VOLTAREN ARTHRITIS PAIN 1 % GEL Apply 2 g topically 3 (three) times daily. Apply to right knee 2 g 0   zinc oxide 20 % ointment Apply 1 Application topically as needed (after every incontinent episode/redness.).    Vital Signs: Vitals:   08/09/22 1310  BP: (!) 92/52  Pulse: 62  Resp: 20  SpO2: 96%      Physical Exam:  Pulmonary-Clear to auscultation bilaterally Wounds-2 Nylon sutures in place on right;these were removed without difficulty. Wound is clean and dry.   Diagnostic Tests: CXR to be taken. Per son, if [facility] is still full with patients, son will ask [facility] to take portable CXR and will bring disc in to  view. I will follow up results and contact patient's son accordingly  Impression and Plan: Once CXR results obtained, will decide about future follow up appointment    Nani Skillern, PA-C [facility] [phone]

## 2022-08-08 NOTE — Progress Notes (Signed)
Location:  [place] Room Number: [place] of Service:  ALF [phone]) Provider:  Windell Moulding, NP   Patient Care Team: Virgie Dad, MD as PCP - General (Internal Medicine) Bensimhon, Shaune Pascal, MD as PCP - Advanced Heart Failure (Cardiology) Wenda Low, MD as Consulting Physician (Internal Medicine)  Extended Emergency Contact Information Primary Emergency Contact: [place] Mobile Phone: [phone] Relation: Son Secondary Emergency Contact: Rauh,[name] Mobile Phone: [phone] Relation: Son  Code Status:  DNR Goals of care: Advanced Directive information    08/08/2022   10:17 AM  Advanced Directives  Does Patient Have a Medical Advance Directive? Yes  Type of Paramedic of [place];Living will;Out of facility DNR (pink MOST or yellow form)  Does patient want to make changes to medical advance directive? No - Patient declined  Copy of [place] in Chart? Yes - validated most recent copy scanned in chart (See row information)  Pre-existing out of facility DNR order (yellow form or pink MOST form) Pink MOST form placed in chart (order not valid for inpatient use);Yellow form placed in chart (order not valid for inpatient use)     Chief Complaint  Patient presents with   Medical Management of Chronic Issues    Routine Visit    HPI:  Pt is a 89 y.o. male seen today for medical management of chronic diseases.    He currently resides on the assisted nursing unit at [facility]. PMH: aortic atherosclerosis, temporal arteritis, H/o TIA, pleural effusion, GERD, optic neuritis, yellow nail syndrome, fibroma of prostate, gout, hyponatremia, and unstable gait.    Pleural effusion- followed by Dr. 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, pleurx drain removed 01/09> tolerated procedure well> suture removal 01/18,  denies sob, remains on metolazone once week Dementia- MMSE 23/30, 01/2021 CT head noted  chronic vessel changes, no recent agitation, remains on Aricept and Namenda  Urinary/urge incontinence- now using briefs, reduced episodes of soiling bed linens per nursing CHF- 03/13/2022 LVEF 55-60%, remains on torsemide and spirolactone BPH- Cardura reduced due to hypotension Gout- remains on allopurinol Hypothyroidism- TSH 3.07 11/06/2021, remains on levothyroxine GERD- hgb 12.5 06/25/2022, remains on Protonix Depression- 01/04 Lexapro started, f/u bmp not completed Right knee pain- remains on scheduled tylenol and voltaren gel Unstable gait- ambulates with walker, ambulation improved per son, able to walk to [facility] for lunch  Recent blood pressures:  01/10- 108/56  01/03- 110/63  12/27- 96/54  Recent weights:  01/08- 135.4 lbs  12/01- 139.8 lbs  11/01- 137.8 lbs       Past Medical History:  Diagnosis Date   Actinic keratoses    Arthritis    CHF (congestive heart failure) (HCC)    Chronic bilateral pleural effusions    Chronic sinusitis    Colon polyps    Dyspnea 07/13/2013   BNP normal   Edema 07/13/2013   GERD (gastroesophageal reflux disease)    omeprazole 1-2 times daily   H/O TIA (transient ischemic attack) and stroke    on statins and plavix -saw Dr.Sethi in the past   Headache    History of BPH    History of TIA (transient ischemic attack) 07/13/2013   Left inguinal hernia    Left inguinal hernia 01/27/2019   Lumbar radiculopathy    Need for shingles vaccine    Optic neuritis 10/2015   left eye   Perennial allergic rhinitis    Polymyalgia rheumatica ([place]) 07/13/2013   Daily prednisone   Restless leg  syndrome    Retinal disease    right eye since birth    Shingles    Temporal arteritis ([place]) 10/2015   Wears glasses    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   Past Surgical History:  Procedure Laterality Date   BRAVO [place] STUDY N/A 04/18/2016   Procedure: BRAVO [place];  Surgeon: Wilford Corner, MD;  Location: [facility];  Service: Endoscopy;  Laterality:  N/A;   CHEST TUBE INSERTION Right 02/13/2018   Procedure: INSERTION PLEURAL DRAINAGE CATHETER;  Surgeon: Ivin Poot, MD;  Location: [place];  Service: Thoracic;  Laterality: Right;   CHEST TUBE INSERTION Left 11/27/2019   Procedure: REDO INSERTION PLEURAL DRAINAGE CATHETER USING 15.5 FR. [place] CATH;  Surgeon: [name], Collier Salina, MD;  Location: [place];  Service: Thoracic;  Laterality: Left;   COLONOSCOPY     drropy eyelid surgery     ESOPHAGOGASTRODUODENOSCOPY (EGD) WITH PROPOFOL N/A 04/18/2016   Procedure: ESOPHAGOGASTRODUODENOSCOPY (EGD) WITH PROPOFOL;  Surgeon: Wilford Corner, MD;  Location: [facility];  Service: Endoscopy;  Laterality: N/A;   INGUINAL HERNIA REPAIR Left 01/27/2019   Procedure: OPEN REPAIR LEFT INGUINAL HERNIA WITH MESH;  Surgeon: Fanny Skates, MD;  Location: [place];  Service: General;  Laterality: Left;  GENERAL AND TAP BLOCK ANESTHESIA   INSERTION OF MESH Left 01/27/2019   Procedure: Insertion Of Mesh;  Surgeon: Fanny Skates, MD;  Location: [place];  Service: General;  Laterality: Left;   IR THORACENTESIS ASP PLEURAL SPACE W/IMG GUIDE  02/04/2018   IR THORACENTESIS ASP PLEURAL SPACE W/IMG GUIDE  02/06/2018   REMOVAL OF PLEURAL DRAINAGE CATHETER Left 11/27/2019   Procedure: REMOVAL OF PLEURAL DRAINAGE CATHETER;  Surgeon: Ivin Poot, MD;  Location: [place];  Service: Thoracic;  Laterality: Left;   REMOVAL OF PLEURAL DRAINAGE CATHETER Left 03/31/2020   Procedure: REMOVAL OF PLEURAL DRAINAGE CATHETER;  Surgeon: Ivin Poot, MD;  Location: [place];  Service: Thoracic;  Laterality: Left;   RIGHT/LEFT HEART CATH AND CORONARY ANGIOGRAPHY N/A 06/24/2018   Procedure: RIGHT/LEFT HEART CATH AND CORONARY ANGIOGRAPHY;  Surgeon: Jolaine Artist, MD;  Location: [place] [facility];  Service: Cardiovascular;  Laterality: N/A;   VASECTOMY      No Known Allergies  Outpatient Encounter Medications as of 08/08/2022  Medication Sig   acetaminophen (TYLENOL) 500 MG tablet Take 2 tablets  (1,000 mg total) by mouth in the morning and at bedtime.   allopurinol (ZYLOPRIM) 300 MG tablet Take 150 mg by mouth in the morning. For gout   aspirin 81 MG chewable tablet Chew 81 mg by mouth in the morning. Chew and swallow tablet   donepezil (ARICEPT ODT) 10 MG disintegrating tablet Take 1 tablet (10 mg total) by mouth at bedtime. For co++ +   doxazosin (CARDURA) 8 MG tablet Take 0.5 tablets (4 mg total) by mouth daily.   escitalopram (LEXAPRO) 10 MG tablet Take 1 tablet (10 mg total) by mouth daily. Take 1/2 tablet po daily x 7 days, then increase to 1 tablet daily.   famotidine (PEPCID) 20 MG tablet Take 20 mg by mouth daily as needed for indigestion.   guaiFENesin (MUCINEX) 600 MG 12 hr tablet Take 600 mg by mouth 2 (two) times daily as needed for cough.   levothyroxine (SYNTHROID) 25 MCG tablet Take 25 mcg by mouth daily before breakfast.   memantine (NAMENDA) 10 MG tablet Take 1 tablet (10 mg total) by mouth 2 (two) times daily.   metolazone (ZAROXOLYN) 2.5 MG tablet Take  1 tablet (2.5 mg total) by mouth every Wednesday.   neomycin-bacitracin-polymyxin (NEOSPORIN) OINT Apply 1 Application topically every Monday. Applied to exit after draining right pleural catheter.   pantoprazole (PROTONIX) 20 MG tablet Take 20 mg by mouth daily before breakfast.   potassium chloride SA (KLOR-CON M) 20 MEQ tablet Take 20-40 mEq by mouth See admin instructions. Take 2 tablets (40 meq) by mouth in the morning & take 1 tablet (20 meq) by mouth at bedtime.   simvastatin (ZOCOR) 20 MG tablet Take 20 mg by mouth every evening.   spironolactone (ALDACTONE) 25 MG tablet Take 0.5 tablets (12.5 mg total) by mouth daily.   torsemide (DEMADEX) 20 MG tablet Take 60 mg by mouth 2 (two) times daily.   VOLTAREN ARTHRITIS PAIN 1 % GEL Apply 2 g topically 3 (three) times daily. Apply to right knee   zinc oxide 20 % ointment Apply 1 Application topically as needed (after every incontinent episode/redness.).   No  facility-administered encounter medications on file as of 08/08/2022.    Review of Systems  Unable to perform ROS: Dementia    Immunization History  Administered Date(s) Administered   Fluad Quad(high Dose 65+) 04/27/2021, 05/16/2022   Influenza, High Dose Seasonal PF 04/15/2017, 05/16/2018   Influenza,inj,Quad PF,6+ Mos 04/23/2015   Moderna SARS-COV2 Booster Vaccination 12/28/2020, 04/20/2021   Moderna Sars-Covid-2 Vaccination 07/26/2019, 08/26/2019, 06/06/2020   Pneumococcal Conjugate-13 02/14/2016, 07/23/2016   Pneumococcal Polysaccharide-23 11/13/2012   Td 01/29/2006   Tdap 08/07/2018   Zoster Recombinat (Shingrix) 02/18/2019   Zoster, Live 02/18/2019, 07/01/2019   Pertinent  Health Maintenance Due  Topic Date Due   INFLUENZA VACCINE  Completed      01/27/2019   10:15 AM 03/19/2022    1:03 PM 05/10/2022    3:46 PM 06/25/2022    2:44 AM 08/08/2022   10:15 AM  Fall Risk  Falls in the past year?  1 1  0  Was there an injury with Fall?  0 0  0  Fall Risk Category Calculator  2 2  0  Fall Risk Category (Retired)  Moderate Moderate    (RETIRED) Patient Fall Risk Level High fall risk Moderate fall risk Moderate fall risk High fall risk   Patient at Risk for Falls Due to  History of fall(s);Impaired balance/gait;Impaired mobility History of fall(s);Impaired balance/gait;Impaired mobility  No Fall Risks  Fall risk Follow up  Falls evaluation completed;Falls prevention discussed;Education provided Falls evaluation completed  Falls evaluation completed   Functional Status Survey:    Vitals:   08/08/22 1007  BP: (!) 108/56  Pulse: 82  Resp: 20  Temp: 97.9 F (36.6 C)  SpO2: 92%  Weight: 135 lb 4 oz (61.3 kg)  Height: '5\' 6"'$  (1.676 m)   Body mass index is 21.83 kg/m. Physical Exam Vitals reviewed.  Constitutional:      General: He is not in acute distress. HENT:     Head: Normocephalic.     Right Ear: There is no impacted cerumen.     Left Ear: There is no impacted  cerumen.     Nose: Nose normal.     Mouth/Throat:     Mouth: Mucous membranes are moist.  Eyes:     General:        Right eye: No discharge.        Left eye: No discharge.  Cardiovascular:     Rate and Rhythm: Normal rate and regular rhythm.     Pulses: Normal pulses.  Heart sounds: Murmur heard.  Pulmonary:     Effort: Pulmonary effort is normal. No respiratory distress.     Breath sounds: Normal breath sounds. No wheezing.  Abdominal:     General: Bowel sounds are normal. There is no distension.     Tenderness: There is no abdominal tenderness.  Musculoskeletal:     Cervical back: Neck supple.     Right lower leg: Edema present.     Left lower leg: Edema present.     Comments: 1+ pitting  Skin:    General: Skin is warm and dry.     Capillary Refill: Capillary refill takes less than 2 seconds.     Comments: UTA pleurx incision  Neurological:     General: No focal deficit present.     Mental Status: He is alert and oriented to person, place, and time.     Motor: Weakness present.     Gait: Gait abnormal.     Comments: walker  Psychiatric:        Mood and Affect: Mood normal.        Behavior: Behavior normal.     Comments: Very pleasant, slowed speech, alert to self/place/familiar face     Labs reviewed: Recent Labs    09/14/21 1411 11/06/21 0000 06/25/22 1330  NA 138 143 141  K 3.5 3.9 4.0  CL 99 106 103  CO2 29 31* 29*  GLUCOSE 118*  --   --   BUN 28* 33* 49*  CREATININE 1.50* 1.2 1.5*  CALCIUM 8.4* 8.2* 8.4*   Recent Labs    11/06/21 0000 06/25/22 1330  AST 11* 27  ALT 5* 13  ALKPHOS 48 59  ALBUMIN 3.3* 3.3*   Recent Labs    11/06/21 0000 06/25/22 1330  WBC 7.1 10.4  NEUTROABS  --  7,124.00  HGB 12.7* 12.5*  HCT 39* 37*  PLT 308 403*   Lab Results  Component Value Date   TSH 3.07 11/06/2021   No results found for: "HGBA1C" No results found for: "CHOL", "HDL", "LDLCALC", "LDLDIRECT", "TRIG", "CHOLHDL"  Significant Diagnostic Results  in last 30 days:  DG Chest 2 View  Result Date: 07/24/2022 CLINICAL DATA:  Pleural effusion. EXAM: CHEST - 2 VIEW COMPARISON:  July 02, 2022. FINDINGS: The heart size and mediastinal contours are within normal limits. Stable small loculated pleural effusions are noted with associated bibasilar atelectasis or scarring. Stable right-sided pleural drainage catheter is noted. The visualized skeletal structures are unremarkable. IMPRESSION: Stable right-sided pleural drainage catheter. Stable small loculated pleural effusions are noted with associated bibasilar atelectasis or scarring. Electronically Signed   By: Marijo Conception M.D.   On: 07/24/2022 13:20    Assessment/Plan 1. Recurrent pleural effusion - followed by Dr. Darcey Nora - 01/09 pleurx drain removed> tolerated well - lung sounds clear, UTA assess sutures - 01/18 suture removal  2. Moderate dementia without behavioral disturbance, psychotic disturbance, mood disturbance, or anxiety, unspecified dementia type ([place]) - no behaviors - cont Aricept and Namenda - family requesting conservative management, no neurology consult at this time  3. Urge incontinence - no wearing briefs - no urology consult  4. Chronic diastolic CHF (congestive heart failure) (HCC) - 1+ pitting edema, weights stable - remains on high dose diuretics   5. Benign prostatic hyperplasia, unspecified whether lower urinary tract symptoms present - Cardura reduced due to hypotension  6. Chronic gout without tophus, unspecified cause, unspecified site - no recent episodes - cont allopurinol  7. Acquired hypothyroidism -  TSH stable - cont levothyroxine  8. Gastroesophageal reflux disease without esophagitis - hgb stable - cont Protonix  9. Moderate episode of recurrent major depressive disorder (HCC) - recently started on Lexapro - bmp- future  10. Chronic pain of right knee - stable with scheduled tylenol and Voltaren gel  11. Unstable gait - no  recent falls - ambulating with walker well   Family/ staff Communication: plan discussed with   Labs/tests ordered:  bmp

## 2022-08-09 ENCOUNTER — Ambulatory Visit (INDEPENDENT_AMBULATORY_CARE_PROVIDER_SITE_OTHER): Payer: Medicare Other | Admitting: Physician Assistant

## 2022-08-09 VITALS — BP 92/52 | HR 62 | Resp 20

## 2022-08-09 DIAGNOSIS — M6281 Muscle weakness (generalized): Secondary | ICD-10-CM | POA: Diagnosis not present

## 2022-08-09 DIAGNOSIS — R1312 Dysphagia, oropharyngeal phase: Secondary | ICD-10-CM | POA: Diagnosis not present

## 2022-08-09 DIAGNOSIS — J90 Pleural effusion, not elsewhere classified: Secondary | ICD-10-CM

## 2022-08-09 DIAGNOSIS — R2681 Unsteadiness on feet: Secondary | ICD-10-CM | POA: Diagnosis not present

## 2022-08-09 DIAGNOSIS — R41841 Cognitive communication deficit: Secondary | ICD-10-CM | POA: Diagnosis not present

## 2022-08-09 DIAGNOSIS — N39498 Other specified urinary incontinence: Secondary | ICD-10-CM | POA: Diagnosis not present

## 2022-08-09 DIAGNOSIS — M25561 Pain in right knee: Secondary | ICD-10-CM | POA: Diagnosis not present

## 2022-08-10 DIAGNOSIS — R2681 Unsteadiness on feet: Secondary | ICD-10-CM | POA: Diagnosis not present

## 2022-08-10 DIAGNOSIS — R41841 Cognitive communication deficit: Secondary | ICD-10-CM | POA: Diagnosis not present

## 2022-08-10 DIAGNOSIS — N39498 Other specified urinary incontinence: Secondary | ICD-10-CM | POA: Diagnosis not present

## 2022-08-10 DIAGNOSIS — M25561 Pain in right knee: Secondary | ICD-10-CM | POA: Diagnosis not present

## 2022-08-10 DIAGNOSIS — M6281 Muscle weakness (generalized): Secondary | ICD-10-CM | POA: Diagnosis not present

## 2022-08-10 DIAGNOSIS — R1312 Dysphagia, oropharyngeal phase: Secondary | ICD-10-CM | POA: Diagnosis not present

## 2022-08-13 DIAGNOSIS — R2681 Unsteadiness on feet: Secondary | ICD-10-CM | POA: Diagnosis not present

## 2022-08-13 DIAGNOSIS — N39498 Other specified urinary incontinence: Secondary | ICD-10-CM | POA: Diagnosis not present

## 2022-08-13 DIAGNOSIS — M6281 Muscle weakness (generalized): Secondary | ICD-10-CM | POA: Diagnosis not present

## 2022-08-13 DIAGNOSIS — R41841 Cognitive communication deficit: Secondary | ICD-10-CM | POA: Diagnosis not present

## 2022-08-13 DIAGNOSIS — M25561 Pain in right knee: Secondary | ICD-10-CM | POA: Diagnosis not present

## 2022-08-13 DIAGNOSIS — R1312 Dysphagia, oropharyngeal phase: Secondary | ICD-10-CM | POA: Diagnosis not present

## 2022-08-13 MED ORDER — MEMANTINE HCL 10 MG PO TABS: 10.0000 mg | ORAL_TABLET | Freq: Two times a day (BID) | ORAL | 1 refills | Status: DC

## 2022-08-13 NOTE — Addendum Note (Signed)
Addended by: Logan Bores on: 08/13/2022 12:26 PM   Modules accepted: Orders

## 2022-08-14 DIAGNOSIS — R1312 Dysphagia, oropharyngeal phase: Secondary | ICD-10-CM | POA: Diagnosis not present

## 2022-08-14 DIAGNOSIS — R41841 Cognitive communication deficit: Secondary | ICD-10-CM | POA: Diagnosis not present

## 2022-08-14 DIAGNOSIS — M6281 Muscle weakness (generalized): Secondary | ICD-10-CM | POA: Diagnosis not present

## 2022-08-14 DIAGNOSIS — N39498 Other specified urinary incontinence: Secondary | ICD-10-CM | POA: Diagnosis not present

## 2022-08-14 DIAGNOSIS — M25561 Pain in right knee: Secondary | ICD-10-CM | POA: Diagnosis not present

## 2022-08-14 DIAGNOSIS — R2681 Unsteadiness on feet: Secondary | ICD-10-CM | POA: Diagnosis not present

## 2022-08-15 DIAGNOSIS — R41841 Cognitive communication deficit: Secondary | ICD-10-CM | POA: Diagnosis not present

## 2022-08-15 DIAGNOSIS — R2681 Unsteadiness on feet: Secondary | ICD-10-CM | POA: Diagnosis not present

## 2022-08-15 DIAGNOSIS — N39498 Other specified urinary incontinence: Secondary | ICD-10-CM | POA: Diagnosis not present

## 2022-08-15 DIAGNOSIS — M25561 Pain in right knee: Secondary | ICD-10-CM | POA: Diagnosis not present

## 2022-08-15 DIAGNOSIS — M6281 Muscle weakness (generalized): Secondary | ICD-10-CM | POA: Diagnosis not present

## 2022-08-15 DIAGNOSIS — R1312 Dysphagia, oropharyngeal phase: Secondary | ICD-10-CM | POA: Diagnosis not present

## 2022-08-16 DIAGNOSIS — I10 Essential (primary) hypertension: Secondary | ICD-10-CM | POA: Diagnosis not present

## 2022-08-16 DIAGNOSIS — M6281 Muscle weakness (generalized): Secondary | ICD-10-CM | POA: Diagnosis not present

## 2022-08-16 DIAGNOSIS — R2681 Unsteadiness on feet: Secondary | ICD-10-CM | POA: Diagnosis not present

## 2022-08-16 DIAGNOSIS — R1312 Dysphagia, oropharyngeal phase: Secondary | ICD-10-CM | POA: Diagnosis not present

## 2022-08-16 DIAGNOSIS — R41841 Cognitive communication deficit: Secondary | ICD-10-CM | POA: Diagnosis not present

## 2022-08-16 DIAGNOSIS — N39498 Other specified urinary incontinence: Secondary | ICD-10-CM | POA: Diagnosis not present

## 2022-08-16 DIAGNOSIS — M25561 Pain in right knee: Secondary | ICD-10-CM | POA: Diagnosis not present

## 2022-08-16 LAB — COMPREHENSIVE METABOLIC PANEL
Calcium: 8.9 (ref 8.7–10.7)
eGFR: 45

## 2022-08-16 LAB — BASIC METABOLIC PANEL
BUN: 47 — AB (ref 4–21)
CO2: 33 — AB (ref 13–22)
Chloride: 99 (ref 99–108)
Creatinine: 1.5 — AB (ref 0.6–1.3)
Glucose: 117
Potassium: 3.5 mEq/L (ref 3.5–5.1)
Sodium: 141 (ref 137–147)

## 2022-08-17 DIAGNOSIS — R1312 Dysphagia, oropharyngeal phase: Secondary | ICD-10-CM | POA: Diagnosis not present

## 2022-08-17 DIAGNOSIS — M6281 Muscle weakness (generalized): Secondary | ICD-10-CM | POA: Diagnosis not present

## 2022-08-17 DIAGNOSIS — N39498 Other specified urinary incontinence: Secondary | ICD-10-CM | POA: Diagnosis not present

## 2022-08-17 DIAGNOSIS — R2681 Unsteadiness on feet: Secondary | ICD-10-CM | POA: Diagnosis not present

## 2022-08-17 DIAGNOSIS — M25561 Pain in right knee: Secondary | ICD-10-CM | POA: Diagnosis not present

## 2022-08-17 DIAGNOSIS — R41841 Cognitive communication deficit: Secondary | ICD-10-CM | POA: Diagnosis not present

## 2022-08-20 DIAGNOSIS — R41841 Cognitive communication deficit: Secondary | ICD-10-CM | POA: Diagnosis not present

## 2022-08-20 DIAGNOSIS — M6281 Muscle weakness (generalized): Secondary | ICD-10-CM | POA: Diagnosis not present

## 2022-08-20 DIAGNOSIS — N39498 Other specified urinary incontinence: Secondary | ICD-10-CM | POA: Diagnosis not present

## 2022-08-20 DIAGNOSIS — R1312 Dysphagia, oropharyngeal phase: Secondary | ICD-10-CM | POA: Diagnosis not present

## 2022-08-20 DIAGNOSIS — M25561 Pain in right knee: Secondary | ICD-10-CM | POA: Diagnosis not present

## 2022-08-20 DIAGNOSIS — R2681 Unsteadiness on feet: Secondary | ICD-10-CM | POA: Diagnosis not present

## 2022-08-21 DIAGNOSIS — M6281 Muscle weakness (generalized): Secondary | ICD-10-CM | POA: Diagnosis not present

## 2022-08-21 DIAGNOSIS — M25561 Pain in right knee: Secondary | ICD-10-CM | POA: Diagnosis not present

## 2022-08-21 DIAGNOSIS — R2681 Unsteadiness on feet: Secondary | ICD-10-CM | POA: Diagnosis not present

## 2022-08-21 DIAGNOSIS — R41841 Cognitive communication deficit: Secondary | ICD-10-CM | POA: Diagnosis not present

## 2022-08-21 DIAGNOSIS — N39498 Other specified urinary incontinence: Secondary | ICD-10-CM | POA: Diagnosis not present

## 2022-08-21 DIAGNOSIS — R1312 Dysphagia, oropharyngeal phase: Secondary | ICD-10-CM | POA: Diagnosis not present

## 2022-08-22 DIAGNOSIS — M6281 Muscle weakness (generalized): Secondary | ICD-10-CM | POA: Diagnosis not present

## 2022-08-22 DIAGNOSIS — N39498 Other specified urinary incontinence: Secondary | ICD-10-CM | POA: Diagnosis not present

## 2022-08-22 DIAGNOSIS — R41841 Cognitive communication deficit: Secondary | ICD-10-CM | POA: Diagnosis not present

## 2022-08-22 DIAGNOSIS — R1312 Dysphagia, oropharyngeal phase: Secondary | ICD-10-CM | POA: Diagnosis not present

## 2022-08-22 DIAGNOSIS — M25561 Pain in right knee: Secondary | ICD-10-CM | POA: Diagnosis not present

## 2022-08-22 DIAGNOSIS — R2681 Unsteadiness on feet: Secondary | ICD-10-CM | POA: Diagnosis not present

## 2022-08-23 DIAGNOSIS — R2689 Other abnormalities of gait and mobility: Secondary | ICD-10-CM | POA: Diagnosis not present

## 2022-08-23 DIAGNOSIS — R1312 Dysphagia, oropharyngeal phase: Secondary | ICD-10-CM | POA: Diagnosis not present

## 2022-08-23 DIAGNOSIS — M6281 Muscle weakness (generalized): Secondary | ICD-10-CM | POA: Diagnosis not present

## 2022-08-23 DIAGNOSIS — M25561 Pain in right knee: Secondary | ICD-10-CM | POA: Diagnosis not present

## 2022-08-23 DIAGNOSIS — R2681 Unsteadiness on feet: Secondary | ICD-10-CM | POA: Diagnosis not present

## 2022-08-23 DIAGNOSIS — N39498 Other specified urinary incontinence: Secondary | ICD-10-CM | POA: Diagnosis not present

## 2022-08-23 DIAGNOSIS — R41841 Cognitive communication deficit: Secondary | ICD-10-CM | POA: Diagnosis not present

## 2022-08-24 DIAGNOSIS — R41841 Cognitive communication deficit: Secondary | ICD-10-CM | POA: Diagnosis not present

## 2022-08-24 DIAGNOSIS — M6281 Muscle weakness (generalized): Secondary | ICD-10-CM | POA: Diagnosis not present

## 2022-08-24 DIAGNOSIS — M25561 Pain in right knee: Secondary | ICD-10-CM | POA: Diagnosis not present

## 2022-08-24 DIAGNOSIS — N39498 Other specified urinary incontinence: Secondary | ICD-10-CM | POA: Diagnosis not present

## 2022-08-24 DIAGNOSIS — R2681 Unsteadiness on feet: Secondary | ICD-10-CM | POA: Diagnosis not present

## 2022-08-24 DIAGNOSIS — R1312 Dysphagia, oropharyngeal phase: Secondary | ICD-10-CM | POA: Diagnosis not present

## 2022-08-25 ENCOUNTER — Other Ambulatory Visit (HOSPITAL_COMMUNITY): Payer: Self-pay | Admitting: Internal Medicine

## 2022-08-27 DIAGNOSIS — M6281 Muscle weakness (generalized): Secondary | ICD-10-CM | POA: Diagnosis not present

## 2022-08-27 DIAGNOSIS — R1312 Dysphagia, oropharyngeal phase: Secondary | ICD-10-CM | POA: Diagnosis not present

## 2022-08-27 DIAGNOSIS — N39498 Other specified urinary incontinence: Secondary | ICD-10-CM | POA: Diagnosis not present

## 2022-08-27 DIAGNOSIS — R2681 Unsteadiness on feet: Secondary | ICD-10-CM | POA: Diagnosis not present

## 2022-08-27 DIAGNOSIS — M25561 Pain in right knee: Secondary | ICD-10-CM | POA: Diagnosis not present

## 2022-08-27 DIAGNOSIS — R41841 Cognitive communication deficit: Secondary | ICD-10-CM | POA: Diagnosis not present

## 2022-08-28 DIAGNOSIS — M6281 Muscle weakness (generalized): Secondary | ICD-10-CM | POA: Diagnosis not present

## 2022-08-28 DIAGNOSIS — M25561 Pain in right knee: Secondary | ICD-10-CM | POA: Diagnosis not present

## 2022-08-28 DIAGNOSIS — R1312 Dysphagia, oropharyngeal phase: Secondary | ICD-10-CM | POA: Diagnosis not present

## 2022-08-28 DIAGNOSIS — N39498 Other specified urinary incontinence: Secondary | ICD-10-CM | POA: Diagnosis not present

## 2022-08-28 DIAGNOSIS — R2681 Unsteadiness on feet: Secondary | ICD-10-CM | POA: Diagnosis not present

## 2022-08-28 DIAGNOSIS — R41841 Cognitive communication deficit: Secondary | ICD-10-CM | POA: Diagnosis not present

## 2022-08-29 DIAGNOSIS — M25561 Pain in right knee: Secondary | ICD-10-CM | POA: Diagnosis not present

## 2022-08-29 DIAGNOSIS — N39498 Other specified urinary incontinence: Secondary | ICD-10-CM | POA: Diagnosis not present

## 2022-08-29 DIAGNOSIS — M6281 Muscle weakness (generalized): Secondary | ICD-10-CM | POA: Diagnosis not present

## 2022-08-29 DIAGNOSIS — R41841 Cognitive communication deficit: Secondary | ICD-10-CM | POA: Diagnosis not present

## 2022-08-29 DIAGNOSIS — R1312 Dysphagia, oropharyngeal phase: Secondary | ICD-10-CM | POA: Diagnosis not present

## 2022-08-29 DIAGNOSIS — R2681 Unsteadiness on feet: Secondary | ICD-10-CM | POA: Diagnosis not present

## 2022-08-30 DIAGNOSIS — M6281 Muscle weakness (generalized): Secondary | ICD-10-CM | POA: Diagnosis not present

## 2022-08-30 DIAGNOSIS — M25561 Pain in right knee: Secondary | ICD-10-CM | POA: Diagnosis not present

## 2022-08-30 DIAGNOSIS — R41841 Cognitive communication deficit: Secondary | ICD-10-CM | POA: Diagnosis not present

## 2022-08-30 DIAGNOSIS — R1312 Dysphagia, oropharyngeal phase: Secondary | ICD-10-CM | POA: Diagnosis not present

## 2022-08-30 DIAGNOSIS — N39498 Other specified urinary incontinence: Secondary | ICD-10-CM | POA: Diagnosis not present

## 2022-08-30 DIAGNOSIS — R2681 Unsteadiness on feet: Secondary | ICD-10-CM | POA: Diagnosis not present

## 2022-08-31 DIAGNOSIS — M6281 Muscle weakness (generalized): Secondary | ICD-10-CM | POA: Diagnosis not present

## 2022-08-31 DIAGNOSIS — R1312 Dysphagia, oropharyngeal phase: Secondary | ICD-10-CM | POA: Diagnosis not present

## 2022-08-31 DIAGNOSIS — R2681 Unsteadiness on feet: Secondary | ICD-10-CM | POA: Diagnosis not present

## 2022-08-31 DIAGNOSIS — R41841 Cognitive communication deficit: Secondary | ICD-10-CM | POA: Diagnosis not present

## 2022-08-31 DIAGNOSIS — N39498 Other specified urinary incontinence: Secondary | ICD-10-CM | POA: Diagnosis not present

## 2022-08-31 DIAGNOSIS — M25561 Pain in right knee: Secondary | ICD-10-CM | POA: Diagnosis not present

## 2022-09-03 DIAGNOSIS — R41841 Cognitive communication deficit: Secondary | ICD-10-CM | POA: Diagnosis not present

## 2022-09-03 DIAGNOSIS — R1312 Dysphagia, oropharyngeal phase: Secondary | ICD-10-CM | POA: Diagnosis not present

## 2022-09-03 DIAGNOSIS — M25561 Pain in right knee: Secondary | ICD-10-CM | POA: Diagnosis not present

## 2022-09-03 DIAGNOSIS — N39498 Other specified urinary incontinence: Secondary | ICD-10-CM | POA: Diagnosis not present

## 2022-09-03 DIAGNOSIS — M6281 Muscle weakness (generalized): Secondary | ICD-10-CM | POA: Diagnosis not present

## 2022-09-03 DIAGNOSIS — R2681 Unsteadiness on feet: Secondary | ICD-10-CM | POA: Diagnosis not present

## 2022-09-04 DIAGNOSIS — R41841 Cognitive communication deficit: Secondary | ICD-10-CM | POA: Diagnosis not present

## 2022-09-04 DIAGNOSIS — N39498 Other specified urinary incontinence: Secondary | ICD-10-CM | POA: Diagnosis not present

## 2022-09-04 DIAGNOSIS — M25561 Pain in right knee: Secondary | ICD-10-CM | POA: Diagnosis not present

## 2022-09-04 DIAGNOSIS — R2681 Unsteadiness on feet: Secondary | ICD-10-CM | POA: Diagnosis not present

## 2022-09-04 DIAGNOSIS — M6281 Muscle weakness (generalized): Secondary | ICD-10-CM | POA: Diagnosis not present

## 2022-09-04 DIAGNOSIS — R1312 Dysphagia, oropharyngeal phase: Secondary | ICD-10-CM | POA: Diagnosis not present

## 2022-09-05 DIAGNOSIS — R1312 Dysphagia, oropharyngeal phase: Secondary | ICD-10-CM | POA: Diagnosis not present

## 2022-09-05 DIAGNOSIS — N39498 Other specified urinary incontinence: Secondary | ICD-10-CM | POA: Diagnosis not present

## 2022-09-05 DIAGNOSIS — M6281 Muscle weakness (generalized): Secondary | ICD-10-CM | POA: Diagnosis not present

## 2022-09-05 DIAGNOSIS — R2681 Unsteadiness on feet: Secondary | ICD-10-CM | POA: Diagnosis not present

## 2022-09-05 DIAGNOSIS — R41841 Cognitive communication deficit: Secondary | ICD-10-CM | POA: Diagnosis not present

## 2022-09-05 DIAGNOSIS — M25561 Pain in right knee: Secondary | ICD-10-CM | POA: Diagnosis not present

## 2022-09-06 DIAGNOSIS — R1312 Dysphagia, oropharyngeal phase: Secondary | ICD-10-CM | POA: Diagnosis not present

## 2022-09-06 DIAGNOSIS — R2681 Unsteadiness on feet: Secondary | ICD-10-CM | POA: Diagnosis not present

## 2022-09-06 DIAGNOSIS — R41841 Cognitive communication deficit: Secondary | ICD-10-CM | POA: Diagnosis not present

## 2022-09-06 DIAGNOSIS — M6281 Muscle weakness (generalized): Secondary | ICD-10-CM | POA: Diagnosis not present

## 2022-09-06 DIAGNOSIS — M25561 Pain in right knee: Secondary | ICD-10-CM | POA: Diagnosis not present

## 2022-09-06 DIAGNOSIS — N39498 Other specified urinary incontinence: Secondary | ICD-10-CM | POA: Diagnosis not present

## 2022-09-07 DIAGNOSIS — M25561 Pain in right knee: Secondary | ICD-10-CM | POA: Diagnosis not present

## 2022-09-07 DIAGNOSIS — N39498 Other specified urinary incontinence: Secondary | ICD-10-CM | POA: Diagnosis not present

## 2022-09-07 DIAGNOSIS — M6281 Muscle weakness (generalized): Secondary | ICD-10-CM | POA: Diagnosis not present

## 2022-09-07 DIAGNOSIS — R1312 Dysphagia, oropharyngeal phase: Secondary | ICD-10-CM | POA: Diagnosis not present

## 2022-09-07 DIAGNOSIS — R41841 Cognitive communication deficit: Secondary | ICD-10-CM | POA: Diagnosis not present

## 2022-09-07 DIAGNOSIS — R2681 Unsteadiness on feet: Secondary | ICD-10-CM | POA: Diagnosis not present

## 2022-09-11 DIAGNOSIS — R41841 Cognitive communication deficit: Secondary | ICD-10-CM | POA: Diagnosis not present

## 2022-09-11 DIAGNOSIS — R1312 Dysphagia, oropharyngeal phase: Secondary | ICD-10-CM | POA: Diagnosis not present

## 2022-09-11 DIAGNOSIS — N39498 Other specified urinary incontinence: Secondary | ICD-10-CM | POA: Diagnosis not present

## 2022-09-11 DIAGNOSIS — R2681 Unsteadiness on feet: Secondary | ICD-10-CM | POA: Diagnosis not present

## 2022-09-11 DIAGNOSIS — M6281 Muscle weakness (generalized): Secondary | ICD-10-CM | POA: Diagnosis not present

## 2022-09-11 DIAGNOSIS — M25561 Pain in right knee: Secondary | ICD-10-CM | POA: Diagnosis not present

## 2022-09-18 DIAGNOSIS — M25561 Pain in right knee: Secondary | ICD-10-CM | POA: Diagnosis not present

## 2022-09-18 DIAGNOSIS — R41841 Cognitive communication deficit: Secondary | ICD-10-CM | POA: Diagnosis not present

## 2022-09-18 DIAGNOSIS — R1312 Dysphagia, oropharyngeal phase: Secondary | ICD-10-CM | POA: Diagnosis not present

## 2022-09-18 DIAGNOSIS — M6281 Muscle weakness (generalized): Secondary | ICD-10-CM | POA: Diagnosis not present

## 2022-09-18 DIAGNOSIS — R2681 Unsteadiness on feet: Secondary | ICD-10-CM | POA: Diagnosis not present

## 2022-09-18 DIAGNOSIS — N39498 Other specified urinary incontinence: Secondary | ICD-10-CM | POA: Diagnosis not present

## 2022-09-20 DIAGNOSIS — M6281 Muscle weakness (generalized): Secondary | ICD-10-CM | POA: Diagnosis not present

## 2022-09-20 DIAGNOSIS — R41841 Cognitive communication deficit: Secondary | ICD-10-CM | POA: Diagnosis not present

## 2022-09-20 DIAGNOSIS — M25561 Pain in right knee: Secondary | ICD-10-CM | POA: Diagnosis not present

## 2022-09-20 DIAGNOSIS — R1312 Dysphagia, oropharyngeal phase: Secondary | ICD-10-CM | POA: Diagnosis not present

## 2022-09-20 DIAGNOSIS — N39498 Other specified urinary incontinence: Secondary | ICD-10-CM | POA: Diagnosis not present

## 2022-09-20 DIAGNOSIS — R2681 Unsteadiness on feet: Secondary | ICD-10-CM | POA: Diagnosis not present

## 2022-09-26 DIAGNOSIS — R1312 Dysphagia, oropharyngeal phase: Secondary | ICD-10-CM | POA: Diagnosis not present

## 2022-09-26 DIAGNOSIS — R41841 Cognitive communication deficit: Secondary | ICD-10-CM | POA: Diagnosis not present

## 2022-09-27 ENCOUNTER — Encounter (HOSPITAL_COMMUNITY): Payer: Medicare Other | Admitting: Internal Medicine

## 2022-09-27 DIAGNOSIS — R41841 Cognitive communication deficit: Secondary | ICD-10-CM | POA: Diagnosis not present

## 2022-09-27 DIAGNOSIS — R1312 Dysphagia, oropharyngeal phase: Secondary | ICD-10-CM | POA: Diagnosis not present

## 2022-10-01 DIAGNOSIS — R41841 Cognitive communication deficit: Secondary | ICD-10-CM | POA: Diagnosis not present

## 2022-10-01 DIAGNOSIS — R1312 Dysphagia, oropharyngeal phase: Secondary | ICD-10-CM | POA: Diagnosis not present

## 2022-10-04 DIAGNOSIS — R1312 Dysphagia, oropharyngeal phase: Secondary | ICD-10-CM | POA: Diagnosis not present

## 2022-10-04 DIAGNOSIS — R41841 Cognitive communication deficit: Secondary | ICD-10-CM | POA: Diagnosis not present

## 2022-10-08 DIAGNOSIS — R1312 Dysphagia, oropharyngeal phase: Secondary | ICD-10-CM | POA: Diagnosis not present

## 2022-10-08 DIAGNOSIS — R41841 Cognitive communication deficit: Secondary | ICD-10-CM | POA: Diagnosis not present

## 2022-10-15 ENCOUNTER — Other Ambulatory Visit (HOSPITAL_COMMUNITY): Payer: Self-pay | Admitting: Internal Medicine

## 2022-10-23 ENCOUNTER — Other Ambulatory Visit: Payer: Self-pay | Admitting: Internal Medicine

## 2022-10-23 NOTE — Telephone Encounter (Signed)
Pharmacy requested refill.  °Pended Rx and sent to Dr. Gupta for approval due to HIGH ALERT Warning.  °

## 2022-10-25 ENCOUNTER — Encounter: Payer: Self-pay | Admitting: Internal Medicine

## 2022-10-25 ENCOUNTER — Non-Acute Institutional Stay: Payer: Medicare Other | Admitting: Internal Medicine

## 2022-10-25 DIAGNOSIS — M1A9XX0 Chronic gout, unspecified, without tophus (tophi): Secondary | ICD-10-CM | POA: Diagnosis not present

## 2022-10-25 DIAGNOSIS — N3941 Urge incontinence: Secondary | ICD-10-CM

## 2022-10-25 DIAGNOSIS — N400 Enlarged prostate without lower urinary tract symptoms: Secondary | ICD-10-CM | POA: Diagnosis not present

## 2022-10-25 DIAGNOSIS — G8929 Other chronic pain: Secondary | ICD-10-CM

## 2022-10-25 DIAGNOSIS — I5032 Chronic diastolic (congestive) heart failure: Secondary | ICD-10-CM | POA: Diagnosis not present

## 2022-10-25 DIAGNOSIS — K219 Gastro-esophageal reflux disease without esophagitis: Secondary | ICD-10-CM | POA: Diagnosis not present

## 2022-10-25 DIAGNOSIS — M25561 Pain in right knee: Secondary | ICD-10-CM | POA: Diagnosis not present

## 2022-10-25 DIAGNOSIS — E039 Hypothyroidism, unspecified: Secondary | ICD-10-CM

## 2022-10-25 DIAGNOSIS — J90 Pleural effusion, not elsewhere classified: Secondary | ICD-10-CM | POA: Diagnosis not present

## 2022-10-25 DIAGNOSIS — F331 Major depressive disorder, recurrent, moderate: Secondary | ICD-10-CM

## 2022-10-25 DIAGNOSIS — F03B0 Unspecified dementia, moderate, without behavioral disturbance, psychotic disturbance, mood disturbance, and anxiety: Secondary | ICD-10-CM | POA: Diagnosis not present

## 2022-10-25 NOTE — Progress Notes (Signed)
Location:  [facility] Room Number: 28A Place of Service:  ALF [phone]) Provider:  Mahlon Gammon, MD   Mahlon Gammon, MD  Patient Care Team: Mahlon Gammon, MD as PCP - General (Internal Medicine) Bensimhon, Bevelyn Buckles, MD as PCP - Advanced Heart Failure (Cardiology) Georgann Housekeeper, MD as Consulting Physician (Internal Medicine)  Extended Emergency Contact Information Primary Emergency Contact: Older,[name] Mobile Phone: [phone] Relation: Son Secondary Emergency Contact: Collyer,[name] Mobile Phone: [phone] Relation: Son  Code Status:  DNR  Goals of care: Advanced Directive information    10/25/2022   12:40 PM  Advanced Directives  Does Patient Have a Medical Advance Directive? Yes  Type of Advance Directive Out of facility DNR (pink MOST or yellow form)  Does patient want to make changes to medical advance directive? No - Patient declined     Chief Complaint  Patient presents with   Medical Management of Chronic Issues    Patient is being seen for a routine visit     HPI:  Pt is a 89 y.o. male seen today for medical management of chronic diseases.    Lives in AL in [facility]  Patient  has h/o  CHF Followed By Dr Gala Romney 2 D echo has shown EF of 55%.   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 Now needing more help with his ADLS Mainly stays in his room Had discussed with son before and opted for conservative approach Depression Some better with Lexapro  Knee pain  Uses Voltaren and it is helping  No other acute issues Wt Readings from Last 3 Encounters:  10/25/22 136 lb (61.7 kg)  08/08/22 135 lb 4 oz (61.3 kg)  07/31/22 124 lb (56.2 kg)       Past Medical History:  Diagnosis  Date   Actinic keratoses    Arthritis    CHF (congestive heart failure)    Chronic bilateral pleural effusions    Chronic sinusitis    Colon polyps    Dyspnea 07/13/2013   BNP normal   Edema 07/13/2013   GERD (gastroesophageal reflux disease)    omeprazole 1-2 times daily   H/O TIA (transient ischemic attack) and stroke    on statins and plavix -saw Dr.Sethi in the past   Headache    History of BPH    History of TIA (transient ischemic attack) 07/13/2013   Left inguinal hernia    Left inguinal hernia 01/27/2019   Lumbar radiculopathy    Need for shingles vaccine    Optic neuritis 10/2015   left eye   Perennial allergic rhinitis    Polymyalgia rheumatica 07/13/2013   Daily prednisone   Restless leg syndrome    Retinal disease    right eye since birth    Shingles    Temporal arteritis 10/2015   Wears glasses    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   Past Surgical History:  Procedure Laterality Date   BRAVO PH STUDY N/A 04/18/2016   Procedure: BRAVO PH STUDY;  Surgeon: Charlott Rakes, MD;  Location: [facility];  Service: Endoscopy;  Laterality: N/A;   CHEST TUBE INSERTION Right 02/13/2018   Procedure: INSERTION PLEURAL DRAINAGE CATHETER;  Surgeon: Kerin Perna, MD;  Location: [facility] OR;  Service: Thoracic;  Laterality: Right;  CHEST TUBE INSERTION Left 11/27/2019   Procedure: REDO INSERTION PLEURAL DRAINAGE CATHETER USING 15.5 FR. PLEURX CATH;  Surgeon: Donata Clay, Theron Arista, MD;  Location: [facility] OR;  Service: Thoracic;  Laterality: Left;   COLONOSCOPY     drropy eyelid surgery     ESOPHAGOGASTRODUODENOSCOPY (EGD) WITH PROPOFOL N/A 04/18/2016   Procedure: ESOPHAGOGASTRODUODENOSCOPY (EGD) WITH PROPOFOL;  Surgeon: Charlott Rakes, MD;  Location: [facility];  Service: Endoscopy;  Laterality: N/A;   INGUINAL HERNIA REPAIR Left 01/27/2019   Procedure: OPEN REPAIR LEFT INGUINAL HERNIA WITH MESH;  Surgeon: Claud Kelp, MD;  Location: [facility] OR;  Service: General;  Laterality: Left;  GENERAL AND  TAP BLOCK ANESTHESIA   INSERTION OF MESH Left 01/27/2019   Procedure: Insertion Of Mesh;  Surgeon: Claud Kelp, MD;  Location: [facility] OR;  Service: General;  Laterality: Left;   IR THORACENTESIS ASP PLEURAL SPACE W/IMG GUIDE  02/04/2018   IR THORACENTESIS ASP PLEURAL SPACE W/IMG GUIDE  02/06/2018   REMOVAL OF PLEURAL DRAINAGE CATHETER Left 11/27/2019   Procedure: REMOVAL OF PLEURAL DRAINAGE CATHETER;  Surgeon: Kerin Perna, MD;  Location: [facility] OR;  Service: Thoracic;  Laterality: Left;   REMOVAL OF PLEURAL DRAINAGE CATHETER Left 03/31/2020   Procedure: REMOVAL OF PLEURAL DRAINAGE CATHETER;  Surgeon: Kerin Perna, MD;  Location: [facility] OR;  Service: Thoracic;  Laterality: Left;   RIGHT/LEFT HEART CATH AND CORONARY ANGIOGRAPHY N/A 06/24/2018   Procedure: RIGHT/LEFT HEART CATH AND CORONARY ANGIOGRAPHY;  Surgeon: Dolores Patty, MD;  Location: [facility] INVAS[facility] CV LAB;  Service: Cardiovascular;  Laterality: N/A;   VASECTOMY      Not on File  Outpatient Encounter Medications as of 10/25/2022  Medication Sig   allopurinol (ZYLOPRIM) 300 MG tablet Take 150 mg by mouth in the morning. For gout   aspirin 81 MG chewable tablet Chew 81 mg by mouth in the morning. Chew and swallow tablet   donepezil (ARICEPT ODT) 10 MG disintegrating tablet Take 1 tablet (10 mg total) by mouth at bedtime. For co++ +   doxazosin (CARDURA) 8 MG tablet Take 0.5 tablets (4 mg total) by mouth daily.   escitalopram (LEXAPRO) 10 MG tablet Take 1 tablet (10 mg total) by mouth daily. Take 1/2 tablet po daily x 7 days, then increase to 1 tablet daily.   guaiFENesin (MUCINEX) 600 MG 12 hr tablet Take 600 mg by mouth 2 (two) times daily as needed for cough.   levothyroxine (SYNTHROID) 25 MCG tablet Take 25 mcg by mouth daily before breakfast.   memantine (NAMENDA) 10 MG tablet Take 1 tablet (10 mg total) by mouth 2 (two) times daily.   metolazone (ZAROXOLYN) 2.5 MG tablet TAKE 1 TABLET BY MOUTH EVERY  WEDNESDAY   pantoprazole (PROTONIX)  20 MG tablet Take 20 mg by mouth daily before breakfast.   potassium chloride SA (KLOR-CON M) 20 MEQ tablet Take 20-40 mEq by mouth See admin instructions. Take 2 tablets (40 meq) by mouth in the morning & take 1 tablet (20 meq) by mouth at bedtime.   simvastatin (ZOCOR) 20 MG tablet Take 20 mg by mouth every evening.   spironolactone (ALDACTONE) 25 MG tablet TAKE ONE-HALF TABLET BY MOUTH  DAILY   torsemide (DEMADEX) 20 MG tablet TAKE 3 TABLETS BY MOUTH IN THE  MORNING AND 2 TABLETS BY MOUTH  IN THE EVENING   VOLTAREN ARTHRITIS PAIN 1 % GEL Apply 2 g topically 3 (three) times daily. Apply to right knee   zinc oxide 20 % ointment Apply 1  Application topically as needed (after every incontinent episode/redness.).   acetaminophen (TYLENOL) 500 MG tablet Take 2 tablets (1,000 mg total) by mouth in the morning and at bedtime.   famotidine (PEPCID) 20 MG tablet Take 20 mg by mouth daily as needed for indigestion.   neomycin-bacitracin-polymyxin (NEOSPORIN) OINT Apply 1 Application topically every Monday. Applied to exit after draining right pleural catheter. (Patient not taking: Reported on 10/25/2022)   No facility-administered encounter medications on file as of 10/25/2022.    Review of Systems  Unable to perform ROS: Dementia    Immunization History  Administered Date(s) Administered   Fluad Quad(high Dose 65+) 04/27/2021, 05/16/2022   Influenza, High Dose Seasonal PF 04/15/2017, 05/16/2018   Influenza,inj,Quad PF,6+ Mos 04/23/2015   Moderna SARS-COV2 Booster Vaccination 12/28/2020, 04/20/2021   Moderna Sars-Covid-2 Vaccination 07/26/2019, 08/26/2019, 06/06/2020   Pneumococcal Conjugate-13 02/14/2016, 07/23/2016   Pneumococcal Polysaccharide-23 11/13/2012   Td 01/29/2006   Tdap 08/07/2018   Zoster Recombinat (Shingrix) 02/18/2019   Zoster, Live 02/18/2019, 07/01/2019   Pertinent  Health Maintenance Due  Topic Date Due   INFLUENZA VACCINE  02/21/2023      01/27/2019   10:15 AM 03/19/2022     1:03 PM 05/10/2022    3:46 PM 06/25/2022    2:44 AM 08/08/2022   10:15 AM  Fall Risk  Falls in the past year?  1 1  0  Was there an injury with Fall?  0 0  0  Fall Risk Category Calculator  2 2  0  Fall Risk Category (Retired)  Moderate Moderate    (RETIRED) Patient Fall Risk Level High fall risk Moderate fall risk Moderate fall risk High fall risk   Patient at Risk for Falls Due to  History of fall(s);Impaired balance/gait;Impaired mobility History of fall(s);Impaired balance/gait;Impaired mobility  No Fall Risks  Fall risk Follow up  Falls evaluation completed;Falls prevention discussed;Education provided Falls evaluation completed  Falls evaluation completed   Functional Status Survey:    Vitals:   10/25/22 1235  BP: (!) 98/54  Pulse: 62  Resp: 18  Temp: 97.8 F (36.6 C)  TempSrc: Temporal  SpO2: 94%  Weight: 136 lb (61.7 kg)  Height: 5\' 6"  (1.676 m)   Body mass index is 21.95 kg/m. Physical Exam Vitals reviewed.  Constitutional:      Appearance: Normal appearance.  HENT:     Head: Normocephalic.     Nose: Nose normal.     Mouth/Throat:     Mouth: Mucous membranes are moist.     Pharynx: Oropharynx is clear.  Eyes:     Pupils: Pupils are equal, round, and reactive to light.  Cardiovascular:     Rate and Rhythm: Normal rate and regular rhythm.     Pulses: Normal pulses.     Heart sounds: Murmur heard.  Pulmonary:     Effort: Pulmonary effort is normal. No respiratory distress.     Breath sounds: Normal breath sounds. No rales.  Abdominal:     General: Abdomen is flat. Bowel sounds are normal.     Palpations: Abdomen is soft.  Musculoskeletal:        General: Swelling present.     Cervical back: Neck supple.  Skin:    General: Skin is warm.  Neurological:     General: No focal deficit present.     Mental Status: He is alert.  Psychiatric:        Mood and Affect: Mood normal.        Thought Content:  Thought content normal.     Labs reviewed: Recent  Labs    11/06/21 0000 06/25/22 1330  NA 143 141  K 3.9 4.0  CL 106 103  CO2 31* 29*  BUN 33* 49*  CREATININE 1.2 1.5*  CALCIUM 8.2* 8.4*   Recent Labs    11/06/21 0000 06/25/22 1330  AST 11* 27  ALT 5* 13  ALKPHOS 48 59  ALBUMIN 3.3* 3.3*   Recent Labs    11/06/21 0000 06/25/22 1330  WBC 7.1 10.4  NEUTROABS  --  7,124.00  HGB 12.7* 12.5*  HCT 39* 37*  PLT 308 403*   Lab Results  Component Value Date   TSH 3.07 11/06/2021   No results found for: "HGBA1C" No results found for: "CHOL", "HDL", "LDLCALC", "LDLDIRECT", "TRIG", "CHOLHDL"  Significant Diagnostic Results in last 30 days:  No results found.  Assessment/Plan 1. Moderate dementia  He is on Aricept and Namenda Have d/w the son before and he wants more conservative approach  He continues to need Cueing for his ADLS 2. Chronic diastolic CHF (congestive heart failure) On High dose of Diuretics Seems stable  3. Recurrent pleural effusion Tube removed No Recurrence since then  4. Benign prostatic hyperplasia,  On Cardura  5. Urge incontinence He is Incontinent and uses Diapers   6. Chronic gout without tophus, unspecified cause, unspecified site Allopurinol  7. Acquired hypothyroidism TSH normal in 04/24  8. Gastroesophageal reflux disease without esophagitis Protonix  9. Moderate episode of recurrent major depressive disorder Lexapro  10. Chronic pain of right knee Voltaren and Tylenol  11 HLD On statin  Family/ staff Communication:   Labs/tests ordered:  CBC,CMP,TSH,Lipid

## 2022-11-05 DIAGNOSIS — I10 Essential (primary) hypertension: Secondary | ICD-10-CM | POA: Diagnosis not present

## 2022-11-06 DIAGNOSIS — N183 Chronic kidney disease, stage 3 unspecified: Secondary | ICD-10-CM | POA: Insufficient documentation

## 2022-11-06 LAB — BASIC METABOLIC PANEL
BUN: 54 — AB (ref 4–21)
CO2: 28 — AB (ref 13–22)
Chloride: 98 — AB (ref 99–108)
Creatinine: 1.4 — AB (ref 0.6–1.3)
Glucose: 99
Potassium: 3.5 mEq/L (ref 3.5–5.1)
Sodium: 138 (ref 137–147)

## 2022-11-06 LAB — COMPREHENSIVE METABOLIC PANEL: Calcium: 8.6 — AB (ref 8.7–10.7)

## 2022-11-18 ENCOUNTER — Other Ambulatory Visit (HOSPITAL_COMMUNITY): Payer: Self-pay | Admitting: Internal Medicine

## 2022-11-22 DIAGNOSIS — E079 Disorder of thyroid, unspecified: Secondary | ICD-10-CM | POA: Diagnosis not present

## 2022-11-22 DIAGNOSIS — I5032 Chronic diastolic (congestive) heart failure: Secondary | ICD-10-CM | POA: Diagnosis not present

## 2022-11-22 DIAGNOSIS — D649 Anemia, unspecified: Secondary | ICD-10-CM | POA: Diagnosis not present

## 2022-11-22 DIAGNOSIS — K219 Gastro-esophageal reflux disease without esophagitis: Secondary | ICD-10-CM | POA: Diagnosis not present

## 2022-11-22 DIAGNOSIS — E039 Hypothyroidism, unspecified: Secondary | ICD-10-CM | POA: Diagnosis not present

## 2022-11-22 DIAGNOSIS — F331 Major depressive disorder, recurrent, moderate: Secondary | ICD-10-CM | POA: Diagnosis not present

## 2022-11-23 ENCOUNTER — Encounter: Payer: Self-pay | Admitting: Orthopedic Surgery

## 2022-11-23 ENCOUNTER — Non-Acute Institutional Stay: Payer: Medicare Other | Admitting: Orthopedic Surgery

## 2022-11-23 DIAGNOSIS — E876 Hypokalemia: Secondary | ICD-10-CM

## 2022-11-23 DIAGNOSIS — F03B0 Unspecified dementia, moderate, without behavioral disturbance, psychotic disturbance, mood disturbance, and anxiety: Secondary | ICD-10-CM | POA: Diagnosis not present

## 2022-11-23 DIAGNOSIS — I5032 Chronic diastolic (congestive) heart failure: Secondary | ICD-10-CM

## 2022-11-23 LAB — HEPATIC FUNCTION PANEL
ALT: 11 U/L (ref 10–40)
AST: 15 (ref 14–40)
Alkaline Phosphatase: 59 (ref 25–125)
Bilirubin, Total: 0.6

## 2022-11-23 LAB — COMPREHENSIVE METABOLIC PANEL
Albumin: 3.9 (ref 3.5–5.0)
Calcium: 9.1 (ref 8.7–10.7)
Globulin: 3.1

## 2022-11-23 LAB — CBC AND DIFFERENTIAL
HCT: 40 — AB (ref 41–53)
Hemoglobin: 13.1 — AB (ref 13.5–17.5)
Neutrophils Absolute: 4409
Platelets: 334 10*3/uL (ref 150–400)
WBC: 8.8

## 2022-11-23 LAB — BASIC METABOLIC PANEL
BUN: 52 — AB (ref 4–21)
CO2: 31 — AB (ref 13–22)
Chloride: 100 (ref 99–108)
Creatinine: 1.5 — AB (ref 0.6–1.3)
Glucose: 97
Potassium: 3.3 mEq/L — AB (ref 3.5–5.1)
Sodium: 141 (ref 137–147)

## 2022-11-23 LAB — LIPID PANEL
Cholesterol: 121 (ref 0–200)
HDL: 48 (ref 35–70)
LDL Cholesterol: 50
Triglycerides: 144 (ref 40–160)

## 2022-11-23 LAB — CBC: RBC: 3.99 (ref 3.87–5.11)

## 2022-11-23 LAB — TSH: TSH: 2.36 (ref 0.41–5.90)

## 2022-11-23 NOTE — Progress Notes (Signed)
Location:   [facility] Room Number: 28-A Place of Service:  ALF [phone]) Provider:  Hazle Nordmann, NP  PCP: Mahlon Gammon, MD  Patient Care Team: Mahlon Gammon, MD as PCP - General (Internal Medicine) Bensimhon, Bevelyn Buckles, MD as PCP - Advanced Heart Failure (Cardiology) Georgann Housekeeper, MD as Consulting Physician (Internal Medicine)  Extended Emergency Contact Information Primary Emergency Contact: Leske,[name] Mobile Phone: [phone] Relation: Son Secondary Emergency Contact: Keiper,[name] Mobile Phone: [phone] Relation: Son  Code Status:  DNR Goals of care: Advanced Directive information    11/23/2022   10:16 AM  Advanced Directives  Does Patient Have a Medical Advance Directive? Yes  Type of Advance Directive Out of facility DNR (pink MOST or yellow form)  Does patient want to make changes to medical advance directive? No - Patient declined     Chief Complaint  Patient presents with   Acute Visit    Hypolalemia.    HPI:  Pt is a 89 y.o. male seen today for an acute visit due to hypokalemia.   He currently resides on the assisted living unit at [facility]. PMH: aortic atherosclerosis, temporal arteritis, H/o TIA, pleural effusion> pleurx drain removed 07/31/2022, GERD, optic neuritis, yellow nail syndrome, fibroma of prostate, gout, hyponatremia, and unstable gait.     K+3.3 11/22/2022. He is currently taking spirolactone and torsemide for CHF. LVEF 55-60% 2022. He does receive KCL 40 meq qam and 20 meq qhs. Poor historian due to dementia. He denies chest pain, sob, palpitations or ankle swelling today. Afebrile. Vitals stable.   No behaviors. Now on Aricept and Namenda. He needs more cueing to complete ADLs.   Past Surgical History:  Procedure Laterality Date   BRAVO PH STUDY N/A 04/18/2016   Procedure: BRAVO PH STUDY;  Surgeon: Charlott Rakes, MD;  Location: [facility];  Service: Endoscopy;  Laterality: N/A;   CHEST TUBE  INSERTION Right 02/13/2018   Procedure: INSERTION PLEURAL DRAINAGE CATHETER;  Surgeon: Kerin Perna, MD;  Location: [facility] OR;  Service: Thoracic;  Laterality: Right;   CHEST TUBE INSERTION Left 11/27/2019   Procedure: REDO INSERTION PLEURAL DRAINAGE CATHETER USING 15.5 FR. PLEURX CATH;  Surgeon: Donata Clay, Theron Arista, MD;  Location: [facility] OR;  Service: Thoracic;  Laterality: Left;   COLONOSCOPY     drropy eyelid surgery     ESOPHAGOGASTRODUODENOSCOPY (EGD) WITH PROPOFOL N/A 04/18/2016   Procedure: ESOPHAGOGASTRODUODENOSCOPY (EGD) WITH PROPOFOL;  Surgeon: Charlott Rakes, MD;  Location: [facility];  Service: Endoscopy;  Laterality: N/A;   INGUINAL HERNIA REPAIR Left 01/27/2019   Procedure: OPEN REPAIR LEFT INGUINAL HERNIA WITH MESH;  Surgeon: Claud Kelp, MD;  Location: [facility] OR;  Service: General;  Laterality: Left;  GENERAL AND TAP BLOCK ANESTHESIA   INSERTION OF MESH Left 01/27/2019   Procedure: Insertion Of Mesh;  Surgeon: Claud Kelp, MD;  Location: [facility] OR;  Service: General;  Laterality: Left;   IR THORACENTESIS ASP PLEURAL SPACE W/IMG GUIDE  02/04/2018   IR THORACENTESIS ASP PLEURAL SPACE W/IMG GUIDE  02/06/2018   REMOVAL OF PLEURAL DRAINAGE CATHETER Left 11/27/2019   Procedure: REMOVAL OF PLEURAL DRAINAGE CATHETER;  Surgeon: Kerin Perna, MD;  Location: [facility] OR;  Service: Thoracic;  Laterality: Left;   REMOVAL OF PLEURAL DRAINAGE CATHETER Left 03/31/2020   Procedure: REMOVAL OF PLEURAL DRAINAGE CATHETER;  Surgeon: Kerin Perna, MD;  Location: [facility] OR;  Service: Thoracic;  Laterality: Left;   RIGHT/LEFT HEART CATH AND CORONARY ANGIOGRAPHY N/A 06/24/2018   Procedure:  RIGHT/LEFT HEART CATH AND CORONARY ANGIOGRAPHY;  Surgeon: Dolores Patty, MD;  Location: [facility] CV LAB;  Service: Cardiovascular;  Laterality: N/A;   VASECTOMY      No Known Allergies  Allergies as of 11/23/2022   No Known Allergies      Medication List        Accurate as of Nov 23, 2022 10:16 AM. If you have any  questions, ask your nurse or doctor.          STOP taking these medications    Voltaren Arthritis Pain 1 % Gel Generic drug: diclofenac Sodium Stopped by: Octavia Heir, NP       TAKE these medications    acetaminophen 500 MG tablet Commonly known as: TYLENOL Take 2 tablets (1,000 mg total) by mouth in the morning and at bedtime.   allopurinol 300 MG tablet Commonly known as: ZYLOPRIM Take 150 mg by mouth in the morning. For gout   aspirin 81 MG chewable tablet Chew 81 mg by mouth in the morning. Chew and swallow tablet   donepezil 10 MG disintegrating tablet Commonly known as: ARICEPT ODT Take 1 tablet (10 mg total) by mouth at bedtime. For co++ +   doxazosin 8 MG tablet Commonly known as: CARDURA Take 0.5 tablets (4 mg total) by mouth daily.   escitalopram 10 MG tablet Commonly known as: Lexapro Take 1 tablet (10 mg total) by mouth daily. Take 1/2 tablet po daily x 7 days, then increase to 1 tablet daily.   famotidine 20 MG tablet Commonly known as: PEPCID Take 20 mg by mouth daily as needed for indigestion.   guaiFENesin 600 MG 12 hr tablet Commonly known as: MUCINEX Take 600 mg by mouth 2 (two) times daily as needed for cough.   levothyroxine 25 MCG tablet Commonly known as: SYNTHROID Take 25 mcg by mouth daily before breakfast.   memantine 10 MG tablet Commonly known as: NAMENDA Take 1 tablet (10 mg total) by mouth 2 (two) times daily.   metolazone 2.5 MG tablet Commonly known as: ZAROXOLYN TAKE 1 TABLET BY MOUTH EVERY  WEDNESDAY   pantoprazole 20 MG tablet Commonly known as: PROTONIX Take 20 mg by mouth daily before breakfast.   potassium chloride SA 20 MEQ tablet Commonly known as: KLOR-CON M Take 20-40 mEq by mouth See admin instructions. Take 2 tablets (40 meq) by mouth in the morning & take 1 tablet (20 meq) by mouth at bedtime.   simvastatin 20 MG tablet Commonly known as: ZOCOR Take 20 mg by mouth every evening.   spironolactone 25 MG  tablet Commonly known as: ALDACTONE TAKE ONE-HALF TABLET BY MOUTH  DAILY   torsemide 20 MG tablet Commonly known as: DEMADEX TAKE 3 TABLETS BY MOUTH IN THE  MORNING AND 2 TABLETS BY MOUTH  IN THE EVENING   zinc oxide 20 % ointment Apply 1 Application topically as needed (after every incontinent episode/redness.).        Review of Systems  Unable to perform ROS: Dementia    Immunization History  Administered Date(s) Administered   Fluad Quad(high Dose 65+) 04/27/2021, 05/16/2022   Influenza, High Dose Seasonal PF 04/15/2017, 05/16/2018   Influenza,inj,Quad PF,6+ Mos 04/23/2015   Moderna SARS-COV2 Booster Vaccination 12/28/2020, 04/20/2021   Moderna Sars-Covid-2 Vaccination 07/26/2019, 08/26/2019, 06/06/2020   Pneumococcal Conjugate-13 02/14/2016, 07/23/2016   Pneumococcal Polysaccharide-23 11/13/2012   Td 01/29/2006   Tdap 08/07/2018   Zoster Recombinat (Shingrix) 02/18/2019   Zoster, Live 02/18/2019, 07/01/2019   Pertinent  Health Maintenance Due  Topic Date Due   INFLUENZA VACCINE  02/21/2023      01/27/2019   10:15 AM 03/19/2022    1:03 PM 05/10/2022    3:46 PM 06/25/2022    2:44 AM 08/08/2022   10:15 AM  Fall Risk  Falls in the past year?  1 1  0  Was there an injury with Fall?  0 0  0  Fall Risk Category Calculator  2 2  0  Fall Risk Category (Retired)  Moderate Moderate    (RETIRED) Patient Fall Risk Level High fall risk Moderate fall risk Moderate fall risk High fall risk   Patient at Risk for Falls Due to  History of fall(s);Impaired balance/gait;Impaired mobility History of fall(s);Impaired balance/gait;Impaired mobility  No Fall Risks  Fall risk Follow up  Falls evaluation completed;Falls prevention discussed;Education provided Falls evaluation completed  Falls evaluation completed   Functional Status Survey:    Vitals:   11/23/22 1007  BP: (!) 126/56  Pulse: 62  Resp: 18  Temp: 97.7 F (36.5 C)  SpO2: 95%  Weight: 132 lb 6.4 oz (60.1 kg)  Height:  5\' 6"  (1.676 m)   Body mass index is 21.37 kg/m. Physical Exam Vitals reviewed.  Constitutional:      General: He is not in acute distress. Eyes:     General:        Right eye: No discharge.        Left eye: No discharge.  Cardiovascular:     Rate and Rhythm: Normal rate and regular rhythm.     Pulses: Normal pulses.     Heart sounds: Normal heart sounds.  Pulmonary:     Effort: Pulmonary effort is normal. No respiratory distress.     Breath sounds: Normal breath sounds. No wheezing or rales.  Abdominal:     General: Bowel sounds are normal.     Palpations: Abdomen is soft.  Musculoskeletal:     Cervical back: Neck supple.     Right lower leg: No edema.     Left lower leg: No edema.  Skin:    General: Skin is warm and dry.     Capillary Refill: Capillary refill takes less than 2 seconds.  Neurological:     General: No focal deficit present.     Mental Status: He is alert. Mental status is at baseline.     Motor: Weakness present.     Gait: Gait abnormal.  Psychiatric:        Mood and Affect: Mood normal.     Labs reviewed: Recent Labs    08/16/22 0000 11/06/22 0000 11/23/22 0000  NA 141 138 141  K 3.5 3.5 3.3*  CL 99 98* 100  CO2 33* 28* 31*  BUN 47* 54* 52*  CREATININE 1.5* 1.4* 1.5*  CALCIUM 8.9 8.6* 9.1   Recent Labs    06/25/22 1330 11/23/22 0000  AST 27 15  ALT 13 11  ALKPHOS 59 59  ALBUMIN 3.3* 3.9   Recent Labs    06/25/22 1330 11/23/22 0000  WBC 10.4 8.8  NEUTROABS 7,124.00 4,409.00  HGB 12.5* 13.1*  HCT 37* 40*  PLT 403* 334   Lab Results  Component Value Date   TSH 3.07 11/06/2021   No results found for: "HGBA1C" Lab Results  Component Value Date   CHOL 121 11/23/2022   HDL 48 11/23/2022   LDLCALC 50 11/23/2022   TRIG 144 11/23/2022    Significant Diagnostic Results in last 30  days:  No results found.  Assessment/Plan 1. Hypokalemia - K+ 3.3 11/22/2022 - on diuretics for CHF/ h/o recurrent pleural effusion - cont  KCL 40 mg qam and 20 mg qhs - will add KCL 20 mg po qhs x 3 days - bmp 05/06  2. Chronic diastolic CHF (congestive heart failure) (HCC) - no weight fluctuations, ankle edema - LVEF 55-60% in 2022 - cont spirolactone and torsemide  3. Moderate dementia without behavioral disturbance, psychotic disturbance, mood disturbance, or anxiety, unspecified dementia type (HCC) - no behaviors - needs cues to complete ADLs - cont Aricept and Namenda    Family/ staff Communication: plan discussed with patient and nurse  Labs/tests ordered: none

## 2022-11-27 ENCOUNTER — Encounter: Payer: Self-pay | Admitting: Adult Health

## 2022-11-27 ENCOUNTER — Non-Acute Institutional Stay: Payer: Medicare Other | Admitting: Adult Health

## 2022-11-27 DIAGNOSIS — F03B0 Unspecified dementia, moderate, without behavioral disturbance, psychotic disturbance, mood disturbance, and anxiety: Secondary | ICD-10-CM | POA: Diagnosis not present

## 2022-11-27 DIAGNOSIS — E039 Hypothyroidism, unspecified: Secondary | ICD-10-CM

## 2022-11-27 DIAGNOSIS — R001 Bradycardia, unspecified: Secondary | ICD-10-CM | POA: Diagnosis not present

## 2022-11-27 NOTE — Progress Notes (Signed)
Location:  [facility] Room Number: 28-A Place of Service:  ALF [phone]) Provider:  Margit Banda. Grayce Sessions, NP   Patient Care Team: Mahlon Gammon, MD as PCP - General (Internal Medicine) Bensimhon, Bevelyn Buckles, MD as PCP - Advanced Heart Failure (Cardiology) Georgann Housekeeper, MD as Consulting Physician (Internal Medicine)  Extended Emergency Contact Information Primary Emergency Contact: Anfinson,[name] Mobile Phone: [phone] Relation: Son Secondary Emergency Contact: Gerads,[name] Mobile Phone: [phone] Relation: Son  Code Status:  DNR Goals of care: Advanced Directive information    11/27/2022   10:17 AM  Advanced Directives  Does Patient Have a Medical Advance Directive? Yes  Type of Estate agent of [place];Living will;Out of facility DNR (pink MOST or yellow form)  Does patient want to make changes to medical advance directive? No - Patient declined  Copy of Healthcare Power of Attorney in Chart? Yes - validated most recent copy scanned in chart (See row information)  Pre-existing out of facility DNR order (yellow form or pink MOST form) Pink MOST form placed in chart (order not valid for inpatient use);Yellow form placed in chart (order not valid for inpatient use)     Chief Complaint  Patient presents with   Acute Visit    Low pulse rate    HPI:  Pt is a 89 y.o. male seen today for an acute visit for low pulse rate. He is a resident of [facility]. He has a PMH of CHF, TIA, BPH and gout. He was reported to have a pulse of 42. He was seen in his room and denies dizziness. Pulse ranging from 42 - 70. He takes Levothyroxine for hypothyroidism with tsh 2.36, 11/22/22. He has dementia with MMSE score 11/30, 10/23/22.   Past Medical History:  Diagnosis Date   Actinic keratoses    Arthritis    CHF (congestive heart failure) (HCC)    Chronic bilateral pleural effusions    Chronic sinusitis    Colon polyps    Dyspnea  07/13/2013   BNP normal   Edema 07/13/2013   GERD (gastroesophageal reflux disease)    omeprazole 1-2 times daily   H/O TIA (transient ischemic attack) and stroke    on statins and plavix -saw Dr.Sethi in the past   Headache    History of BPH    History of TIA (transient ischemic attack) 07/13/2013   Left inguinal hernia    Left inguinal hernia 01/27/2019   Lumbar radiculopathy    Need for shingles vaccine    Optic neuritis 10/2015   left eye   Perennial allergic rhinitis    Polymyalgia rheumatica (HCC) 07/13/2013   Daily prednisone   Restless leg syndrome    Retinal disease    right eye since birth    Shingles    Temporal arteritis (HCC) 10/2015   Wears glasses    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   Past Surgical History:  Procedure Laterality Date   BRAVO PH STUDY N/A 04/18/2016   Procedure: BRAVO PH STUDY;  Surgeon: Charlott Rakes, MD;  Location: [facility];  Service: Endoscopy;  Laterality: N/A;   CHEST TUBE INSERTION Right 02/13/2018   Procedure: INSERTION PLEURAL DRAINAGE CATHETER;  Surgeon: Kerin Perna, MD;  Location: [facility] OR;  Service: Thoracic;  Laterality: Right;   CHEST TUBE INSERTION Left 11/27/2019   Procedure: REDO INSERTION PLEURAL DRAINAGE CATHETER USING 15.5 FR. PLEURX CATH;  Surgeon: Donata Clay, Theron Arista, MD;  Location: [facility] OR;  Service: Thoracic;  Laterality: Left;  COLONOSCOPY     drropy eyelid surgery     ESOPHAGOGASTRODUODENOSCOPY (EGD) WITH PROPOFOL N/A 04/18/2016   Procedure: ESOPHAGOGASTRODUODENOSCOPY (EGD) WITH PROPOFOL;  Surgeon: Charlott Rakes, MD;  Location: [facility];  Service: Endoscopy;  Laterality: N/A;   INGUINAL HERNIA REPAIR Left 01/27/2019   Procedure: OPEN REPAIR LEFT INGUINAL HERNIA WITH MESH;  Surgeon: Claud Kelp, MD;  Location: [facility] OR;  Service: General;  Laterality: Left;  GENERAL AND TAP BLOCK ANESTHESIA   INSERTION OF MESH Left 01/27/2019   Procedure: Insertion Of Mesh;  Surgeon: Claud Kelp, MD;  Location: [facility] OR;  Service:  General;  Laterality: Left;   IR THORACENTESIS ASP PLEURAL SPACE W/IMG GUIDE  02/04/2018   IR THORACENTESIS ASP PLEURAL SPACE W/IMG GUIDE  02/06/2018   REMOVAL OF PLEURAL DRAINAGE CATHETER Left 11/27/2019   Procedure: REMOVAL OF PLEURAL DRAINAGE CATHETER;  Surgeon: Kerin Perna, MD;  Location: [facility] OR;  Service: Thoracic;  Laterality: Left;   REMOVAL OF PLEURAL DRAINAGE CATHETER Left 03/31/2020   Procedure: REMOVAL OF PLEURAL DRAINAGE CATHETER;  Surgeon: Kerin Perna, MD;  Location: [facility] OR;  Service: Thoracic;  Laterality: Left;   RIGHT/LEFT HEART CATH AND CORONARY ANGIOGRAPHY N/A 06/24/2018   Procedure: RIGHT/LEFT HEART CATH AND CORONARY ANGIOGRAPHY;  Surgeon: Dolores Patty, MD;  Location: [facility]VAS[facility] LAB;  Service: Cardiovascular;  Laterality: N/A;   VASECTOMY      No Known Allergies  Outpatient Encounter Medications as of 11/27/2022  Medication Sig   acetaminophen (TYLENOL) 500 MG tablet Take 2 tablets (1,000 mg total) by mouth in the morning and at bedtime.   allopurinol (ZYLOPRIM) 300 MG tablet Take 150 mg by mouth in the morning. For gout   aspirin 81 MG chewable tablet Chew 81 mg by mouth in the morning. Chew and swallow tablet   donepezil (ARICEPT ODT) 10 MG disintegrating tablet Take 1 tablet (10 mg total) by mouth at bedtime. For co++ +   doxazosin (CARDURA) 8 MG tablet Take 0.5 tablets (4 mg total) by mouth daily.   escitalopram (LEXAPRO) 10 MG tablet Take 1 tablet (10 mg total) by mouth daily. Take 1/2 tablet po daily x 7 days, then increase to 1 tablet daily.   famotidine (PEPCID) 20 MG tablet Take 20 mg by mouth daily as needed for indigestion.   guaiFENesin (MUCINEX) 600 MG 12 hr tablet Take 600 mg by mouth 2 (two) times daily as needed for cough.   levothyroxine (SYNTHROID) 25 MCG tablet Take 25 mcg by mouth daily before breakfast.   memantine (NAMENDA) 10 MG tablet Take 1 tablet (10 mg total) by mouth 2 (two) times daily.   metolazone (ZAROXOLYN) 2.5 MG tablet TAKE 1  TABLET BY MOUTH EVERY  WEDNESDAY   pantoprazole (PROTONIX) 20 MG tablet Take 20 mg by mouth daily before breakfast.   potassium chloride SA (KLOR-CON M) 20 MEQ tablet Take 20-40 mEq by mouth See admin instructions. Take 2 tablets (40 meq) by mouth in the morning & take 1 tablet (20 meq) by mouth at bedtime.   simvastatin (ZOCOR) 20 MG tablet Take 20 mg by mouth every evening.   spironolactone (ALDACTONE) 25 MG tablet TAKE ONE-HALF TABLET BY MOUTH  DAILY   torsemide (DEMADEX) 20 MG tablet TAKE 3 TABLETS BY MOUTH IN THE  MORNING AND 2 TABLETS BY MOUTH  IN THE EVENING   zinc oxide 20 % ointment Apply 1 Application topically as needed (after every incontinent episode/redness.).   No facility-administered encounter medications on file as of  11/27/2022.    Review of Systems  Unable to obtain due to dementia.  Immunization History  Administered Date(s) Administered   Fluad Quad(high Dose 65+) 04/27/2021, 05/16/2022   Influenza, High Dose Seasonal PF 04/15/2017, 05/16/2018   Influenza,inj,Quad PF,6+ Mos 04/23/2015   Moderna SARS-COV2 Booster Vaccination 12/28/2020, 04/20/2021   Moderna Sars-Covid-2 Vaccination 07/26/2019, 08/26/2019, 06/06/2020   Pneumococcal Conjugate-13 02/14/2016, 07/23/2016   Pneumococcal Polysaccharide-23 11/13/2012   Td 01/29/2006   Tdap 08/07/2018   Zoster Recombinat (Shingrix) 02/18/2019   Zoster, Live 02/18/2019, 07/01/2019   Pertinent  Health Maintenance Due  Topic Date Due   INFLUENZA VACCINE  02/21/2023      01/27/2019   10:15 AM 03/19/2022    1:03 PM 05/10/2022    3:46 PM 06/25/2022    2:44 AM 08/08/2022   10:15 AM  Fall Risk  Falls in the past year?  1 1  0  Was there an injury with Fall?  0 0  0  Fall Risk Category Calculator  2 2  0  Fall Risk Category (Retired)  Moderate Moderate    (RETIRED) Patient Fall Risk Level High fall risk Moderate fall risk Moderate fall risk High fall risk   Patient at Risk for Falls Due to  History of fall(s);Impaired  balance/gait;Impaired mobility History of fall(s);Impaired balance/gait;Impaired mobility  No Fall Risks  Fall risk Follow up  Falls evaluation completed;Falls prevention discussed;Education provided Falls evaluation completed  Falls evaluation completed   Functional Status Survey:    Vitals:   11/27/22 1001  BP: (!) 114/56  Pulse: (!) 42  Resp: 18  Temp: 97.7 F (36.5 C)  SpO2: 95%  Weight: 129 lb 8 oz (58.7 kg)  Height: 5\' 9"  (1.753 m)   Body mass index is 19.12 kg/m. Physical Exam Constitutional:      General: He is not in acute distress.    Appearance: Normal appearance.  HENT:     Head: Normocephalic and atraumatic.     Mouth/Throat:     Mouth: Mucous membranes are moist.  Eyes:     Conjunctiva/sclera: Conjunctivae normal.  Cardiovascular:     Rate and Rhythm: Regular rhythm. Bradycardia present.     Pulses: Normal pulses.     Heart sounds: Normal heart sounds.  Pulmonary:     Effort: Pulmonary effort is normal.     Breath sounds: Normal breath sounds.  Abdominal:     General: Bowel sounds are normal.     Palpations: Abdomen is soft.  Musculoskeletal:        General: No swelling. Normal range of motion.     Cervical back: Normal range of motion.  Skin:    General: Skin is warm and dry.  Neurological:     Mental Status: He is alert. Mental status is at baseline.  Psychiatric:        Mood and Affect: Mood normal.        Behavior: Behavior normal.     Labs reviewed: Recent Labs    08/16/22 0000 11/06/22 0000 11/23/22 0000  NA 141 138 141  K 3.5 3.5 3.3*  CL 99 98* 100  CO2 33* 28* 31*  BUN 47* 54* 52*  CREATININE 1.5* 1.4* 1.5*  CALCIUM 8.9 8.6* 9.1   Recent Labs    06/25/22 1330 11/23/22 0000  AST 27 15  ALT 13 11  ALKPHOS 59 59  ALBUMIN 3.3* 3.9   Recent Labs    06/25/22 1330 11/23/22 0000  WBC 10.4 8.8  NEUTROABS 7,124.00  4,409.00  HGB 12.5* 13.1*  HCT 37* 40*  PLT 403* 334   Lab Results  Component Value Date   TSH 3.07  11/06/2021   No results found for: "HGBA1C" Lab Results  Component Value Date   CHOL 121 11/23/2022   HDL 48 11/23/2022   LDLCALC 50 11/23/2022   TRIG 144 11/23/2022    Significant Diagnostic Results in last 30 days:  No results found.  Assessment/Plan  1. Bradycardia -  PR 42 -  denies dizziness nor chest pain -  will get EKG done  2. Acquired hypothyroidism Lab Results  Component Value Date   TSH 3.07 11/06/2021   -  tsh 2.36, 11/22/22 -  continue Levothyroxine  3. Moderate dementia without behavioral disturbance, psychotic disturbance, mood disturbance, or anxiety, unspecified dementia type (HCC) -  MMSE 11/24, 10/23/22, ranging in moderate cognitive impairment -  continue Donepezil and Memantine -  continue supportive care    Family/ staff Communication:  Discussed plan of care with charge nurse.  Labs/tests ordered:   EKG

## 2022-11-28 ENCOUNTER — Encounter: Payer: Self-pay | Admitting: Orthopedic Surgery

## 2022-11-28 ENCOUNTER — Non-Acute Institutional Stay: Payer: Medicare Other | Admitting: Orthopedic Surgery

## 2022-11-28 DIAGNOSIS — R001 Bradycardia, unspecified: Secondary | ICD-10-CM

## 2022-11-28 DIAGNOSIS — F03B0 Unspecified dementia, moderate, without behavioral disturbance, psychotic disturbance, mood disturbance, and anxiety: Secondary | ICD-10-CM

## 2022-11-28 DIAGNOSIS — E876 Hypokalemia: Secondary | ICD-10-CM

## 2022-11-28 NOTE — Progress Notes (Signed)
Location:   [facility] Room Number: 28 Place of Service:  ALF [phone]) Provider:  Hazle Nordmann, NP  Mahlon Gammon, MD  Patient Care Team: Mahlon Gammon, MD as PCP - General (Internal Medicine) Bensimhon, Bevelyn Buckles, MD as PCP - Advanced Heart Failure (Cardiology) Georgann Housekeeper, MD as Consulting Physician (Internal Medicine)  Extended Emergency Contact Information Primary Emergency Contact: Husser,[name] Mobile Phone: [phone] Relation: Son Secondary Emergency Contact: Hollenback,[name] Mobile Phone: [phone] Relation: Son  Code Status:  DNR Goals of care: Advanced Directive information    11/28/2022   11:44 AM  Advanced Directives  Does Patient Have a Medical Advance Directive? Yes  Type of Estate agent of [place];Living will;Out of facility DNR (pink MOST or yellow form)  Does patient want to make changes to medical advance directive? No - Patient declined  Copy of Healthcare Power of Attorney in Chart? Yes - validated most recent copy scanned in chart (See row information)  Pre-existing out of facility DNR order (yellow form or pink MOST form) Pink MOST form placed in chart (order not valid for inpatient use);Yellow form placed in chart (order not valid for inpatient use)     Chief Complaint  Patient presents with   Acute Visit    Bradycardia    HPI:  Pt is a 89 y.o. male seen today for an acute visit due to bradycardia.   He currently resides on the assisted living unit at [facility]. PMH: aortic atherosclerosis, temporal arteritis, H/o TIA, pleural effusion> pleurx drain removed 07/31/2022, GERD, optic neuritis, yellow nail syndrome, fibroma of prostate, gout, hyponatremia, and unstable gait.    05/07 seen due to reported HR of 42. EKG performed> NSR with atrial bigeminy> "electrical artifact makes interpretation very difficult." HR averaging 60-70's within past few months. He is a poor historian due to dementia. He  denies chest pain, shortness of breath or lightheadedness. Apical pulse 71 on exam. Afebrile. Vitals stable.    K+ 3.3 11/22/2022. He was given extra KCL 20 meq x 3 days. He does receive scheduled KCL 60 meq daily. BMP scheduled 05/09.   No behaviors. Good family support.   Past Medical History:  Diagnosis Date   Actinic keratoses    Arthritis    CHF (congestive heart failure) (HCC)    Chronic bilateral pleural effusions    Chronic sinusitis    Colon polyps    Dyspnea 07/13/2013   BNP normal   Edema 07/13/2013   GERD (gastroesophageal reflux disease)    omeprazole 1-2 times daily   H/O TIA (transient ischemic attack) and stroke    on statins and plavix -saw Dr.Sethi in the past   Headache    History of BPH    History of TIA (transient ischemic attack) 07/13/2013   Left inguinal hernia    Left inguinal hernia 01/27/2019   Lumbar radiculopathy    Need for shingles vaccine    Optic neuritis 10/2015   left eye   Perennial allergic rhinitis    Polymyalgia rheumatica (HCC) 07/13/2013   Daily prednisone   Restless leg syndrome    Retinal disease    right eye since birth    Shingles    Temporal arteritis (HCC) 10/2015   Wears glasses    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   Past Surgical History:  Procedure Laterality Date   BRAVO PH STUDY N/A 04/18/2016   Procedure: BRAVO PH STUDY;  Surgeon: Charlott Rakes, MD;  Location: [facility];  Service:  Endoscopy;  Laterality: N/A;   CHEST TUBE INSERTION Right 02/13/2018   Procedure: INSERTION PLEURAL DRAINAGE CATHETER;  Surgeon: Kerin Perna, MD;  Location: [facility] OR;  Service: Thoracic;  Laterality: Right;   CHEST TUBE INSERTION Left 11/27/2019   Procedure: REDO INSERTION PLEURAL DRAINAGE CATHETER USING 15.5 FR. PLEURX CATH;  Surgeon: Donata Clay, Theron Arista, MD;  Location: [facility] OR;  Service: Thoracic;  Laterality: Left;   COLONOSCOPY     drropy eyelid surgery     ESOPHAGOGASTRODUODENOSCOPY (EGD) WITH PROPOFOL N/A 04/18/2016   Procedure:  ESOPHAGOGASTRODUODENOSCOPY (EGD) WITH PROPOFOL;  Surgeon: Charlott Rakes, MD;  Location: [facility];  Service: Endoscopy;  Laterality: N/A;   INGUINAL HERNIA REPAIR Left 01/27/2019   Procedure: OPEN REPAIR LEFT INGUINAL HERNIA WITH MESH;  Surgeon: Claud Kelp, MD;  Location: [facility] OR;  Service: General;  Laterality: Left;  GENERAL AND TAP BLOCK ANESTHESIA   INSERTION OF MESH Left 01/27/2019   Procedure: Insertion Of Mesh;  Surgeon: Claud Kelp, MD;  Location: [facility] OR;  Service: General;  Laterality: Left;   IR THORACENTESIS ASP PLEURAL SPACE W/IMG GUIDE  02/04/2018   IR THORACENTESIS ASP PLEURAL SPACE W/IMG GUIDE  02/06/2018   REMOVAL OF PLEURAL DRAINAGE CATHETER Left 11/27/2019   Procedure: REMOVAL OF PLEURAL DRAINAGE CATHETER;  Surgeon: Kerin Perna, MD;  Location: [facility] OR;  Service: Thoracic;  Laterality: Left;   REMOVAL OF PLEURAL DRAINAGE CATHETER Left 03/31/2020   Procedure: REMOVAL OF PLEURAL DRAINAGE CATHETER;  Surgeon: Kerin Perna, MD;  Location: [facility] OR;  Service: Thoracic;  Laterality: Left;   RIGHT/LEFT HEART CATH AND CORONARY ANGIOGRAPHY N/A 06/24/2018   Procedure: RIGHT/LEFT HEART CATH AND CORONARY ANGIOGRAPHY;  Surgeon: Dolores Patty, MD;  Location: [facility];  Service: Cardiovascular;  Laterality: N/A;   VASECTOMY      No Known Allergies  Allergies as of 11/28/2022   No Known Allergies      Medication List        Accurate as of Nov 28, 2022 12:00 PM. If you have any questions, ask your nurse or doctor.          STOP taking these medications    guaiFENesin 600 MG 12 hr tablet Commonly known as: MUCINEX Stopped by: Octavia Heir, NP       TAKE these medications    acetaminophen 500 MG tablet Commonly known as: TYLENOL Take 2 tablets (1,000 mg total) by mouth in the morning and at bedtime.   allopurinol 300 MG tablet Commonly known as: ZYLOPRIM Take 150 mg by mouth in the morning. For gout   aspirin 81 MG chewable tablet Chew 81 mg by mouth  in the morning. Chew and swallow tablet   donepezil 10 MG disintegrating tablet Commonly known as: ARICEPT ODT Take 1 tablet (10 mg total) by mouth at bedtime. For co++ + What changed: additional instructions   doxazosin 4 MG tablet Commonly known as: CARDURA Take 4 mg by mouth daily. What changed: Another medication with the same name was removed. Continue taking this medication, and follow the directions you see here. Changed by: Octavia Heir, NP   escitalopram 10 MG tablet Commonly known as: LEXAPRO Take 10 mg by mouth daily. What changed: Another medication with the same name was removed. Continue taking this medication, and follow the directions you see here. Changed by: Octavia Heir, NP   famotidine 20 MG tablet Commonly known as: PEPCID Take 20 mg by mouth daily as needed for indigestion.  levothyroxine 25 MCG tablet Commonly known as: SYNTHROID Take 25 mcg by mouth daily before breakfast.   memantine 10 MG tablet Commonly known as: NAMENDA Take 1 tablet (10 mg total) by mouth 2 (two) times daily.   metolazone 2.5 MG tablet Commonly known as: ZAROXOLYN TAKE 1 TABLET BY MOUTH EVERY  WEDNESDAY   pantoprazole 20 MG tablet Commonly known as: PROTONIX Take 20 mg by mouth daily before breakfast.   potassium chloride SA 20 MEQ tablet Commonly known as: KLOR-CON M Take 20 mEq by mouth at bedtime.   potassium chloride SA 20 MEQ tablet Commonly known as: KLOR-CON M Take 40 mEq by mouth daily.   simvastatin 20 MG tablet Commonly known as: ZOCOR Take 20 mg by mouth every evening.   spironolactone 25 MG tablet Commonly known as: ALDACTONE TAKE ONE-HALF TABLET BY MOUTH  DAILY   torsemide 20 MG tablet Commonly known as: DEMADEX Take 60 mg by mouth 2 (two) times daily. Give 3 tablet by mouth What changed: Another medication with the same name was removed. Continue taking this medication, and follow the directions you see here. Changed by: Octavia Heir, NP   zinc  oxide 20 % ointment Apply 1 Application topically as needed (after every incontinent episode/redness.).        Review of Systems  Unable to perform ROS: Dementia    Immunization History  Administered Date(s) Administered   Fluad Quad(high Dose 65+) 04/27/2021, 05/16/2022   Influenza, High Dose Seasonal PF 04/15/2017, 05/16/2018   Influenza,inj,Quad PF,6+ Mos 04/23/2015   Moderna SARS-COV2 Booster Vaccination 12/28/2020, 04/20/2021   Moderna Sars-Covid-2 Vaccination 07/26/2019, 08/26/2019, 06/06/2020   Pneumococcal Conjugate-13 02/14/2016, 07/23/2016   Pneumococcal Polysaccharide-23 11/13/2012   Td 01/29/2006   Tdap 08/07/2018   Zoster Recombinat (Shingrix) 02/18/2019   Zoster, Live 02/18/2019, 07/01/2019   Pertinent  Health Maintenance Due  Topic Date Due   INFLUENZA VACCINE  02/21/2023      01/27/2019   10:15 AM 03/19/2022    1:03 PM 05/10/2022    3:46 PM 06/25/2022    2:44 AM 08/08/2022   10:15 AM  Fall Risk  Falls in the past year?  1 1  0  Was there an injury with Fall?  0 0  0  Fall Risk Category Calculator  2 2  0  Fall Risk Category (Retired)  Moderate Moderate    (RETIRED) Patient Fall Risk Level High fall risk Moderate fall risk Moderate fall risk High fall risk   Patient at Risk for Falls Due to  History of fall(s);Impaired balance/gait;Impaired mobility History of fall(s);Impaired balance/gait;Impaired mobility  No Fall Risks  Fall risk Follow up  Falls evaluation completed;Falls prevention discussed;Education provided Falls evaluation completed  Falls evaluation completed   Functional Status Survey:    Vitals:   11/28/22 1141  BP: (!) 126/56  Pulse: (!) 42  Resp: 18  Temp: 97.7 F (36.5 C)  SpO2: 95%  Weight: 131 lb 12.8 oz (59.8 kg)  Height: 5\' 9"  (1.753 m)   Body mass index is 19.46 kg/m. Physical Exam Vitals reviewed.  Constitutional:      General: He is not in acute distress. HENT:     Head: Normocephalic.  Eyes:     General:         Right eye: No discharge.        Left eye: No discharge.  Cardiovascular:     Rate and Rhythm: Normal rate and regular rhythm.     Pulses: Normal pulses.  Heart sounds: Murmur heard.  Pulmonary:     Effort: Pulmonary effort is normal. No respiratory distress.     Breath sounds: Normal breath sounds. No wheezing or rales.  Abdominal:     General: Bowel sounds are normal.     Palpations: Abdomen is soft.  Musculoskeletal:     Cervical back: Neck supple.     Right lower leg: No edema.     Left lower leg: No edema.  Skin:    General: Skin is warm.     Capillary Refill: Capillary refill takes less than 2 seconds.  Neurological:     General: No focal deficit present.     Mental Status: He is alert. Mental status is at baseline.     Motor: Weakness present.     Gait: Gait abnormal.  Psychiatric:        Mood and Affect: Mood normal.     Comments: Very pleasant, follows commands, alert to self/familiar face     Labs reviewed: Recent Labs    08/16/22 0000 11/06/22 0000 11/23/22 0000  NA 141 138 141  K 3.5 3.5 3.3*  CL 99 98* 100  CO2 33* 28* 31*  BUN 47* 54* 52*  CREATININE 1.5* 1.4* 1.5*  CALCIUM 8.9 8.6* 9.1   Recent Labs    06/25/22 1330 11/23/22 0000  AST 27 15  ALT 13 11  ALKPHOS 59 59  ALBUMIN 3.3* 3.9   Recent Labs    06/25/22 1330 11/23/22 0000  WBC 10.4 8.8  NEUTROABS 7,124.00 4,409.00  HGB 12.5* 13.1*  HCT 37* 40*  PLT 403* 334   Lab Results  Component Value Date   TSH 2.36 11/23/2022   No results found for: "HGBA1C" Lab Results  Component Value Date   CHOL 121 11/23/2022   HDL 48 11/23/2022   LDLCALC 50 11/23/2022   TRIG 144 11/23/2022    Significant Diagnostic Results in last 30 days:  No results found.  Assessment/Plan 1. Bradycardia - 05/07 HR 42 - EKG performed> NSR with atrial bigeminy> "electrical artifact makes interpretation very difficult." - not on beta blocker - asymptomatic - no other reports of bradycardia - Apical  pulse 71 today - suspect equipment error - start HR TID x 7 days> give report to PCP  2. Hypokalemia - K+ 3.3 5/02> given extra KCL 20 meq x 3 days - cont scheduled KCL - also on spirolactone - bmp scheduled 05/09  3. Moderate dementia without behavioral disturbance, psychotic disturbance, mood disturbance, or anxiety, unspecified dementia type (HCC) - needing more cues to complete tasks - supportive family - no agitation - cont Aricept and Pension scheme manager Communication: plan discussed with patient and nurse  Labs/tests ordered: none

## 2022-11-29 DIAGNOSIS — I10 Essential (primary) hypertension: Secondary | ICD-10-CM | POA: Diagnosis not present

## 2022-12-03 DIAGNOSIS — I10 Essential (primary) hypertension: Secondary | ICD-10-CM | POA: Diagnosis not present

## 2022-12-07 ENCOUNTER — Encounter: Payer: Self-pay | Admitting: Orthopedic Surgery

## 2022-12-07 ENCOUNTER — Non-Acute Institutional Stay: Payer: Medicare Other | Admitting: Orthopedic Surgery

## 2022-12-07 DIAGNOSIS — R001 Bradycardia, unspecified: Secondary | ICD-10-CM | POA: Diagnosis not present

## 2022-12-07 DIAGNOSIS — E876 Hypokalemia: Secondary | ICD-10-CM | POA: Diagnosis not present

## 2022-12-07 DIAGNOSIS — F03B0 Unspecified dementia, moderate, without behavioral disturbance, psychotic disturbance, mood disturbance, and anxiety: Secondary | ICD-10-CM | POA: Diagnosis not present

## 2022-12-07 MED ORDER — DONEPEZIL HCL 5 MG PO TBDP
5.0000 mg | ORAL_TABLET | Freq: Every day | ORAL | 0 refills | Status: AC
Start: 2022-12-07 — End: ?

## 2022-12-07 NOTE — Progress Notes (Signed)
Location:  [facility] Room Number: NO/28/A Place of Service:  ALF [phone]) Provider:  Hazle Nordmann, NP  Mahlon Gammon, MD  Patient Care Team: Mahlon Gammon, MD as PCP - General (Internal Medicine) Bensimhon, Bevelyn Buckles, MD as PCP - Advanced Heart Failure (Cardiology) Georgann Housekeeper, MD as Consulting Physician (Internal Medicine)  Extended Emergency Contact Information Primary Emergency Contact: Kardell,[name] Mobile Phone: [phone] Relation: Son Secondary Emergency Contact: Aamodt,[name] Mobile Phone: [phone] Relation: Son  Code Status:  DNR Goals of care: Advanced Directive information    11/28/2022   11:44 AM  Advanced Directives  Does Patient Have a Medical Advance Directive? Yes  Type of Estate agent of [place];Living will;Out of facility DNR (pink MOST or yellow form)  Does patient want to make changes to medical advance directive? No - Patient declined  Copy of Healthcare Power of Attorney in Chart? Yes - validated most recent copy scanned in chart (See row information)  Pre-existing out of facility DNR order (yellow form or pink MOST form) Pink MOST form placed in chart (order not valid for inpatient use);Yellow form placed in chart (order not valid for inpatient use)     Chief Complaint  Patient presents with   Acute Visit    Patient is being seen for bradycardia    HPI:  Pt is a 89 y.o. male seen today for an acute visit due to bradycardia.   He currently resides on the assisted living unit at [facility]. PMH: aortic atherosclerosis, temporal arteritis, H/o TIA, pleural effusion> pleurx drain removed 07/31/2022, GERD, optic neuritis, yellow nail syndrome, fibroma of prostate, gout, hyponatremia, and unstable gait.     05/07 HR reported as 42. EKG performed> NSR with atrial bigeminy> "electrical artifact makes interpretation very difficult." Nursing staff has been checking HR TID x 7 days. See trends below.  Poor historian due to dementia. He denies chest pain or sob.   Recent HR:  05/15- 57  05/14- 61, 70, 71  05/13- 65, 62  05/12- 61, 62, 55  05/11- 62, 85, 65     Past Medical History:  Diagnosis Date   Actinic keratoses    Arthritis    CHF (congestive heart failure) (HCC)    Chronic bilateral pleural effusions    Chronic sinusitis    Colon polyps    Dyspnea 07/13/2013   BNP normal   Edema 07/13/2013   GERD (gastroesophageal reflux disease)    omeprazole 1-2 times daily   H/O TIA (transient ischemic attack) and stroke    on statins and plavix -saw Dr.Sethi in the past   Headache    History of BPH    History of TIA (transient ischemic attack) 07/13/2013   Left inguinal hernia    Left inguinal hernia 01/27/2019   Lumbar radiculopathy    Need for shingles vaccine    Optic neuritis 10/2015   left eye   Perennial allergic rhinitis    Polymyalgia rheumatica (HCC) 07/13/2013   Daily prednisone   Restless leg syndrome    Retinal disease    right eye since birth    Shingles    Temporal arteritis (HCC) 10/2015   Wears glasses    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   Past Surgical History:  Procedure Laterality Date   BRAVO PH STUDY N/A 04/18/2016   Procedure: BRAVO PH STUDY;  Surgeon: Charlott Rakes, MD;  Location: [facility];  Service: Endoscopy;  Laterality: N/A;   CHEST TUBE INSERTION Right 02/13/2018  Procedure: INSERTION PLEURAL DRAINAGE CATHETER;  Surgeon: Kerin Perna, MD;  Location: [facility] OR;  Service: Thoracic;  Laterality: Right;   CHEST TUBE INSERTION Left 11/27/2019   Procedure: REDO INSERTION PLEURAL DRAINAGE CATHETER USING 15.5 FR. PLEURX CATH;  Surgeon: Donata Clay, Theron Arista, MD;  Location: [facility] OR;  Service: Thoracic;  Laterality: Left;   COLONOSCOPY     drropy eyelid surgery     ESOPHAGOGASTRODUODENOSCOPY (EGD) WITH PROPOFOL N/A 04/18/2016   Procedure: ESOPHAGOGASTRODUODENOSCOPY (EGD) WITH PROPOFOL;  Surgeon: Charlott Rakes, MD;  Location: [facility];  Service:  Endoscopy;  Laterality: N/A;   INGUINAL HERNIA REPAIR Left 01/27/2019   Procedure: OPEN REPAIR LEFT INGUINAL HERNIA WITH MESH;  Surgeon: Claud Kelp, MD;  Location: [facility] OR;  Service: General;  Laterality: Left;  GENERAL AND TAP BLOCK ANESTHESIA   INSERTION OF MESH Left 01/27/2019   Procedure: Insertion Of Mesh;  Surgeon: Claud Kelp, MD;  Location: [facility] OR;  Service: General;  Laterality: Left;   IR THORACENTESIS ASP PLEURAL SPACE W/IMG GUIDE  02/04/2018   IR THORACENTESIS ASP PLEURAL SPACE W/IMG GUIDE  02/06/2018   REMOVAL OF PLEURAL DRAINAGE CATHETER Left 11/27/2019   Procedure: REMOVAL OF PLEURAL DRAINAGE CATHETER;  Surgeon: Kerin Perna, MD;  Location: [facility] OR;  Service: Thoracic;  Laterality: Left;   REMOVAL OF PLEURAL DRAINAGE CATHETER Left 03/31/2020   Procedure: REMOVAL OF PLEURAL DRAINAGE CATHETER;  Surgeon: Kerin Perna, MD;  Location: [facility] OR;  Service: Thoracic;  Laterality: Left;   RIGHT/LEFT HEART CATH AND CORONARY ANGIOGRAPHY N/A 06/24/2018   Procedure: RIGHT/LEFT HEART CATH AND CORONARY ANGIOGRAPHY;  Surgeon: Dolores Patty, MD;  Location: [facility];  Service: Cardiovascular;  Laterality: N/A;   VASECTOMY      No Known Allergies  Outpatient Encounter Medications as of 12/07/2022  Medication Sig   acetaminophen (TYLENOL) 500 MG tablet Take 2 tablets (1,000 mg total) by mouth in the morning and at bedtime.   allopurinol (ZYLOPRIM) 300 MG tablet Take 150 mg by mouth in the morning. For gout   aspirin 81 MG chewable tablet Chew 81 mg by mouth in the morning. Chew and swallow tablet   donepezil (ARICEPT ODT) 10 MG disintegrating tablet Take 1 tablet (10 mg total) by mouth at bedtime. For co++ + (Patient taking differently: Take 10 mg by mouth at bedtime. for cognition enhancer/dementi)   doxazosin (CARDURA) 4 MG tablet Take 4 mg by mouth daily.   escitalopram (LEXAPRO) 10 MG tablet Take 10 mg by mouth daily.   famotidine (PEPCID) 20 MG tablet Take 20 mg by mouth daily  as needed for indigestion.   levothyroxine (SYNTHROID) 25 MCG tablet Take 25 mcg by mouth daily before breakfast.   memantine (NAMENDA) 10 MG tablet Take 1 tablet (10 mg total) by mouth 2 (two) times daily.   metolazone (ZAROXOLYN) 2.5 MG tablet TAKE 1 TABLET BY MOUTH EVERY  WEDNESDAY   pantoprazole (PROTONIX) 20 MG tablet Take 20 mg by mouth daily before breakfast.   potassium chloride SA (KLOR-CON M) 20 MEQ tablet Take 20 mEq by mouth at bedtime.   potassium chloride SA (KLOR-CON M) 20 MEQ tablet Take 40 mEq by mouth daily.   simvastatin (ZOCOR) 20 MG tablet Take 20 mg by mouth every evening.   spironolactone (ALDACTONE) 25 MG tablet TAKE ONE-HALF TABLET BY MOUTH  DAILY   torsemide (DEMADEX) 20 MG tablet Take 60 mg by mouth 2 (two) times daily. Give 3 tablet by mouth   zinc oxide 20 %  ointment Apply 1 Application topically as needed (after every incontinent episode/redness.).   No facility-administered encounter medications on file as of 12/07/2022.    Review of Systems  Unable to perform ROS: Dementia    Immunization History  Administered Date(s) Administered   Fluad Quad(high Dose 65+) 04/27/2021, 05/16/2022   Influenza, High Dose Seasonal PF 04/15/2017, 05/16/2018   Influenza,inj,Quad PF,6+ Mos 04/23/2015   Moderna SARS-COV2 Booster Vaccination 12/28/2020, 04/20/2021   Moderna Sars-Covid-2 Vaccination 07/26/2019, 08/26/2019, 06/06/2020   Pneumococcal Conjugate-13 02/14/2016, 07/23/2016   Pneumococcal Polysaccharide-23 11/13/2012   Td 01/29/2006   Tdap 08/07/2018   Zoster Recombinat (Shingrix) 02/18/2019   Zoster, Live 02/18/2019, 07/01/2019   Pertinent  Health Maintenance Due  Topic Date Due   INFLUENZA VACCINE  02/21/2023      01/27/2019   10:15 AM 03/19/2022    1:03 PM 05/10/2022    3:46 PM 06/25/2022    2:44 AM 08/08/2022   10:15 AM  Fall Risk  Falls in the past year?  1 1  0  Was there an injury with Fall?  0 0  0  Fall Risk Category Calculator  2 2  0  Fall Risk  Category (Retired)  Moderate Moderate    (RETIRED) Patient Fall Risk Level High fall risk Moderate fall risk Moderate fall risk High fall risk   Patient at Risk for Falls Due to  History of fall(s);Impaired balance/gait;Impaired mobility History of fall(s);Impaired balance/gait;Impaired mobility  No Fall Risks  Fall risk Follow up  Falls evaluation completed;Falls prevention discussed;Education provided Falls evaluation completed  Falls evaluation completed   Functional Status Survey:    There were no vitals filed for this visit. There is no height or weight on file to calculate BMI. Physical Exam Vitals reviewed.  Constitutional:      General: He is not in acute distress. HENT:     Head: Normocephalic.  Eyes:     General:        Right eye: No discharge.        Left eye: No discharge.  Cardiovascular:     Rate and Rhythm: Normal rate and regular rhythm.     Heart sounds: Murmur heard.  Pulmonary:     Effort: Pulmonary effort is normal. No respiratory distress.     Breath sounds: Normal breath sounds. No wheezing or rales.  Abdominal:     General: Bowel sounds are normal.     Palpations: Abdomen is soft.  Musculoskeletal:     Cervical back: Neck supple.     Right lower leg: No edema.     Left lower leg: No edema.  Skin:    General: Skin is warm.     Capillary Refill: Capillary refill takes less than 2 seconds.  Neurological:     General: No focal deficit present.     Mental Status: He is alert and oriented to person, place, and time.  Psychiatric:        Mood and Affect: Mood normal.        Behavior: Behavior normal.     Labs reviewed: Recent Labs    08/16/22 0000 11/06/22 0000 11/23/22 0000  NA 141 138 141  K 3.5 3.5 3.3*  CL 99 98* 100  CO2 33* 28* 31*  BUN 47* 54* 52*  CREATININE 1.5* 1.4* 1.5*  CALCIUM 8.9 8.6* 9.1   Recent Labs    06/25/22 1330 11/23/22 0000  AST 27 15  ALT 13 11  ALKPHOS 59 59  ALBUMIN 3.3* 3.9  Recent Labs    06/25/22 1330  11/23/22 0000  WBC 10.4 8.8  NEUTROABS 7,124.00 4,409.00  HGB 12.5* 13.1*  HCT 37* 40*  PLT 403* 334   Lab Results  Component Value Date   TSH 2.36 11/23/2022   No results found for: "HGBA1C" Lab Results  Component Value Date   CHOL 121 11/23/2022   HDL 48 11/23/2022   LDLCALC 50 11/23/2022   TRIG 144 11/23/2022    Significant Diagnostic Results in last 30 days:  No results found.  Assessment/Plan 1. Bradycardia - HR averaging 55-75 - appears asymptomatic - EKG> NSR with atrial bigeminy> "electrical artifact makes interpretation very difficult."  - TSH 2.36 - K+ 3.4 - plans to reduce Aricept to 5mg  po qhs per pharmacy recommendation  2. Hypokalemia - K+ 3.4 - will increase bedtime KCL to 30 meq qhs - bmp in 1 week  3. Moderate dementia without behavioral disturbance, psychotic disturbance, mood disturbance, or anxiety, unspecified dementia type (HCC) - no behaviors - requiring more assistance - sons help with care daily - see above - will reduce Aricept to 5 mg po qhs   Family/ staff Communication: plans discussed with patient and nurse  Labs/tests ordered:  bmp in 1 week

## 2022-12-08 ENCOUNTER — Emergency Department (HOSPITAL_COMMUNITY): Payer: Medicare Other

## 2022-12-08 ENCOUNTER — Inpatient Hospital Stay (HOSPITAL_COMMUNITY)
Admission: EM | Admit: 2022-12-08 | Discharge: 2022-12-13 | DRG: 312 | Disposition: A | Discharge status: Skilled Nursing Facility | Payer: Medicare Other | Source: Skilled Nursing Facility | Attending: Internal Medicine | Admitting: Internal Medicine

## 2022-12-08 ENCOUNTER — Other Ambulatory Visit: Payer: Self-pay

## 2022-12-08 ENCOUNTER — Encounter (HOSPITAL_COMMUNITY): Payer: Self-pay | Admitting: Emergency Medicine

## 2022-12-08 ENCOUNTER — Other Ambulatory Visit: Payer: Self-pay | Admitting: Internal Medicine

## 2022-12-08 ENCOUNTER — Other Ambulatory Visit: Payer: Self-pay | Admitting: Orthopedic Surgery

## 2022-12-08 DIAGNOSIS — Z87891 Personal history of nicotine dependence: Secondary | ICD-10-CM | POA: Diagnosis not present

## 2022-12-08 DIAGNOSIS — Y92091 Bathroom in other non-institutional residence as the place of occurrence of the external cause: Secondary | ICD-10-CM

## 2022-12-08 DIAGNOSIS — K219 Gastro-esophageal reflux disease without esophagitis: Secondary | ICD-10-CM | POA: Diagnosis present

## 2022-12-08 DIAGNOSIS — G909 Disorder of the autonomic nervous system, unspecified: Secondary | ICD-10-CM | POA: Diagnosis present

## 2022-12-08 DIAGNOSIS — E860 Dehydration: Secondary | ICD-10-CM | POA: Diagnosis not present

## 2022-12-08 DIAGNOSIS — E039 Hypothyroidism, unspecified: Secondary | ICD-10-CM | POA: Diagnosis present

## 2022-12-08 DIAGNOSIS — M6281 Muscle weakness (generalized): Secondary | ICD-10-CM | POA: Diagnosis not present

## 2022-12-08 DIAGNOSIS — R2689 Other abnormalities of gait and mobility: Secondary | ICD-10-CM | POA: Diagnosis not present

## 2022-12-08 DIAGNOSIS — R059 Cough, unspecified: Secondary | ICD-10-CM | POA: Diagnosis not present

## 2022-12-08 DIAGNOSIS — Z8673 Personal history of transient ischemic attack (TIA), and cerebral infarction without residual deficits: Secondary | ICD-10-CM

## 2022-12-08 DIAGNOSIS — R55 Syncope and collapse: Principal | ICD-10-CM | POA: Insufficient documentation

## 2022-12-08 DIAGNOSIS — E876 Hypokalemia: Secondary | ICD-10-CM | POA: Insufficient documentation

## 2022-12-08 DIAGNOSIS — G8911 Acute pain due to trauma: Secondary | ICD-10-CM | POA: Diagnosis not present

## 2022-12-08 DIAGNOSIS — E871 Hypo-osmolality and hyponatremia: Secondary | ICD-10-CM | POA: Diagnosis present

## 2022-12-08 DIAGNOSIS — L605 Yellow nail syndrome: Secondary | ICD-10-CM | POA: Diagnosis not present

## 2022-12-08 DIAGNOSIS — S0101XA Laceration without foreign body of scalp, initial encounter: Secondary | ICD-10-CM | POA: Diagnosis not present

## 2022-12-08 DIAGNOSIS — R109 Unspecified abdominal pain: Secondary | ICD-10-CM | POA: Diagnosis not present

## 2022-12-08 DIAGNOSIS — N1832 Chronic kidney disease, stage 3b: Secondary | ICD-10-CM | POA: Diagnosis present

## 2022-12-08 DIAGNOSIS — Z91118 Patient's noncompliance with dietary regimen for other reason: Secondary | ICD-10-CM | POA: Diagnosis not present

## 2022-12-08 DIAGNOSIS — Z803 Family history of malignant neoplasm of breast: Secondary | ICD-10-CM

## 2022-12-08 DIAGNOSIS — Z823 Family history of stroke: Secondary | ICD-10-CM

## 2022-12-08 DIAGNOSIS — Z8249 Family history of ischemic heart disease and other diseases of the circulatory system: Secondary | ICD-10-CM

## 2022-12-08 DIAGNOSIS — E782 Mixed hyperlipidemia: Secondary | ICD-10-CM | POA: Diagnosis not present

## 2022-12-08 DIAGNOSIS — G309 Alzheimer's disease, unspecified: Secondary | ICD-10-CM | POA: Diagnosis not present

## 2022-12-08 DIAGNOSIS — Z66 Do not resuscitate: Secondary | ICD-10-CM | POA: Diagnosis present

## 2022-12-08 DIAGNOSIS — M1991 Primary osteoarthritis, unspecified site: Secondary | ICD-10-CM | POA: Diagnosis not present

## 2022-12-08 DIAGNOSIS — J90 Pleural effusion, not elsewhere classified: Secondary | ICD-10-CM

## 2022-12-08 DIAGNOSIS — Z7982 Long term (current) use of aspirin: Secondary | ICD-10-CM

## 2022-12-08 DIAGNOSIS — S0990XA Unspecified injury of head, initial encounter: Secondary | ICD-10-CM | POA: Diagnosis not present

## 2022-12-08 DIAGNOSIS — G4489 Other headache syndrome: Secondary | ICD-10-CM | POA: Diagnosis not present

## 2022-12-08 DIAGNOSIS — I959 Hypotension, unspecified: Secondary | ICD-10-CM | POA: Diagnosis not present

## 2022-12-08 DIAGNOSIS — M79631 Pain in right forearm: Secondary | ICD-10-CM | POA: Diagnosis not present

## 2022-12-08 DIAGNOSIS — Z91119 Patient's noncompliance with dietary regimen due to unspecified reason: Secondary | ICD-10-CM | POA: Diagnosis not present

## 2022-12-08 DIAGNOSIS — M109 Gout, unspecified: Secondary | ICD-10-CM | POA: Diagnosis not present

## 2022-12-08 DIAGNOSIS — I130 Hypertensive heart and chronic kidney disease with heart failure and stage 1 through stage 4 chronic kidney disease, or unspecified chronic kidney disease: Secondary | ICD-10-CM | POA: Diagnosis present

## 2022-12-08 DIAGNOSIS — W19XXXA Unspecified fall, initial encounter: Secondary | ICD-10-CM

## 2022-12-08 DIAGNOSIS — F0390 Unspecified dementia without behavioral disturbance: Secondary | ICD-10-CM | POA: Diagnosis present

## 2022-12-08 DIAGNOSIS — I5022 Chronic systolic (congestive) heart failure: Secondary | ICD-10-CM | POA: Diagnosis present

## 2022-12-08 DIAGNOSIS — R1312 Dysphagia, oropharyngeal phase: Secondary | ICD-10-CM | POA: Diagnosis not present

## 2022-12-08 DIAGNOSIS — W1830XA Fall on same level, unspecified, initial encounter: Secondary | ICD-10-CM | POA: Diagnosis present

## 2022-12-08 DIAGNOSIS — Z79899 Other long term (current) drug therapy: Secondary | ICD-10-CM | POA: Diagnosis not present

## 2022-12-08 DIAGNOSIS — M47812 Spondylosis without myelopathy or radiculopathy, cervical region: Secondary | ICD-10-CM | POA: Diagnosis not present

## 2022-12-08 DIAGNOSIS — Z79890 Hormone replacement therapy (postmenopausal): Secondary | ICD-10-CM | POA: Diagnosis not present

## 2022-12-08 DIAGNOSIS — B351 Tinea unguium: Secondary | ICD-10-CM | POA: Diagnosis not present

## 2022-12-08 DIAGNOSIS — Z9889 Other specified postprocedural states: Secondary | ICD-10-CM | POA: Diagnosis not present

## 2022-12-08 DIAGNOSIS — N183 Chronic kidney disease, stage 3 unspecified: Secondary | ICD-10-CM | POA: Diagnosis present

## 2022-12-08 DIAGNOSIS — Z833 Family history of diabetes mellitus: Secondary | ICD-10-CM | POA: Diagnosis not present

## 2022-12-08 DIAGNOSIS — M353 Polymyalgia rheumatica: Secondary | ICD-10-CM | POA: Diagnosis not present

## 2022-12-08 DIAGNOSIS — R404 Transient alteration of awareness: Secondary | ICD-10-CM | POA: Diagnosis not present

## 2022-12-08 DIAGNOSIS — I951 Orthostatic hypotension: Principal | ICD-10-CM | POA: Diagnosis present

## 2022-12-08 DIAGNOSIS — Z7401 Bed confinement status: Secondary | ICD-10-CM | POA: Diagnosis not present

## 2022-12-08 DIAGNOSIS — J942 Hemothorax: Secondary | ICD-10-CM | POA: Diagnosis not present

## 2022-12-08 DIAGNOSIS — J9819 Other pulmonary collapse: Secondary | ICD-10-CM | POA: Diagnosis not present

## 2022-12-08 DIAGNOSIS — N400 Enlarged prostate without lower urinary tract symptoms: Secondary | ICD-10-CM | POA: Diagnosis present

## 2022-12-08 DIAGNOSIS — F02A11 Dementia in other diseases classified elsewhere, mild, with agitation: Secondary | ICD-10-CM | POA: Diagnosis not present

## 2022-12-08 DIAGNOSIS — E559 Vitamin D deficiency, unspecified: Secondary | ICD-10-CM | POA: Diagnosis not present

## 2022-12-08 DIAGNOSIS — F331 Major depressive disorder, recurrent, moderate: Secondary | ICD-10-CM | POA: Diagnosis not present

## 2022-12-08 LAB — CBC WITH DIFFERENTIAL/PLATELET
Abs Immature Granulocytes: 0.19 10*3/uL — ABNORMAL HIGH (ref 0.00–0.07)
Basophils Absolute: 0.1 10*3/uL (ref 0.0–0.1)
Basophils Relative: 1 %
Eosinophils Absolute: 0.9 10*3/uL — ABNORMAL HIGH (ref 0.0–0.5)
Eosinophils Relative: 9 %
HCT: 38 % — ABNORMAL LOW (ref 39.0–52.0)
Hemoglobin: 12.7 g/dL — ABNORMAL LOW (ref 13.0–17.0)
Immature Granulocytes: 2 %
Lymphocytes Relative: 28 %
Lymphs Abs: 2.9 10*3/uL (ref 0.7–4.0)
MCH: 33.2 pg (ref 26.0–34.0)
MCHC: 33.4 g/dL (ref 30.0–36.0)
MCV: 99.5 fL (ref 80.0–100.0)
Monocytes Absolute: 0.7 10*3/uL (ref 0.1–1.0)
Monocytes Relative: 7 %
Neutro Abs: 5.8 10*3/uL (ref 1.7–7.7)
Neutrophils Relative %: 53 %
Platelets: 334 10*3/uL (ref 150–400)
RBC: 3.82 MIL/uL — ABNORMAL LOW (ref 4.22–5.81)
RDW: 14 % (ref 11.5–15.5)
WBC: 10.6 10*3/uL — ABNORMAL HIGH (ref 4.0–10.5)
nRBC: 0 % (ref 0.0–0.2)

## 2022-12-08 LAB — COMPREHENSIVE METABOLIC PANEL
ALT: 13 U/L (ref 0–44)
AST: 23 U/L (ref 15–41)
Albumin: 3.3 g/dL — ABNORMAL LOW (ref 3.5–5.0)
Alkaline Phosphatase: 53 U/L (ref 38–126)
Anion gap: 13 (ref 5–15)
BUN: 51 mg/dL — ABNORMAL HIGH (ref 8–23)
CO2: 25 mmol/L (ref 22–32)
Calcium: 8.6 mg/dL — ABNORMAL LOW (ref 8.9–10.3)
Chloride: 96 mmol/L — ABNORMAL LOW (ref 98–111)
Creatinine, Ser: 1.48 mg/dL — ABNORMAL HIGH (ref 0.61–1.24)
GFR, Estimated: 45 mL/min — ABNORMAL LOW (ref >60)
Glucose, Bld: 107 mg/dL — ABNORMAL HIGH (ref 70–99)
Potassium: 2.6 mmol/L — CL (ref 3.5–5.1)
Sodium: 134 mmol/L — ABNORMAL LOW (ref 135–145)
Total Bilirubin: 0.5 mg/dL (ref 0.3–1.2)
Total Protein: 6.5 g/dL (ref 6.5–8.1)

## 2022-12-08 LAB — TROPONIN I (HIGH SENSITIVITY)
Troponin I (High Sensitivity): 9 ng/L (ref <18)
Troponin I (High Sensitivity): 10 ng/L (ref <18)

## 2022-12-08 LAB — URINALYSIS, W/ REFLEX TO CULTURE (INFECTION SUSPECTED)
Bacteria, UA: NONE SEEN
Bilirubin Urine: NEGATIVE
Glucose, UA: NEGATIVE mg/dL
Hgb urine dipstick: NEGATIVE
Ketones, ur: NEGATIVE mg/dL
Leukocytes,Ua: NEGATIVE
Nitrite: NEGATIVE
Protein, ur: NEGATIVE mg/dL
Specific Gravity, Urine: 1.01 (ref 1.005–1.030)
pH: 6 (ref 5.0–8.0)

## 2022-12-08 LAB — MAGNESIUM: Magnesium: 2.4 mg/dL (ref 1.7–2.4)

## 2022-12-08 LAB — BRAIN NATRIURETIC PEPTIDE: B Natriuretic Peptide: 36.4 pg/mL (ref 0.0–100.0)

## 2022-12-08 LAB — PHOSPHORUS: Phosphorus: 3.4 mg/dL (ref 2.5–4.6)

## 2022-12-08 LAB — POTASSIUM: Potassium: 3.7 mmol/L (ref 3.5–5.1)

## 2022-12-08 MED ORDER — POTASSIUM CHLORIDE 10 MEQ/100ML IV SOLN
10.0000 meq | INTRAVENOUS | Status: AC
  Administered 2022-12-08 (×6): 10 meq via INTRAVENOUS
  Filled 2022-12-08 (×4): qty 100

## 2022-12-08 MED ORDER — MEMANTINE HCL 10 MG PO TABS
10.0000 mg | ORAL_TABLET | Freq: Two times a day (BID) | ORAL | Status: DC
  Administered 2022-12-08 – 2022-12-13 (×10): 10 mg via ORAL
  Filled 2022-12-08 (×10): qty 1

## 2022-12-08 MED ORDER — DONEPEZIL HCL 5 MG PO TBDP: 5.0000 mg | ORAL_TABLET | Freq: Every day | ORAL | Status: DC

## 2022-12-08 MED ORDER — POTASSIUM CHLORIDE CRYS ER 20 MEQ PO TBCR
40.0000 meq | EXTENDED_RELEASE_TABLET | Freq: Every day | ORAL | Status: DC
  Administered 2022-12-09 – 2022-12-13 (×5): 40 meq via ORAL
  Filled 2022-12-08 (×5): qty 2

## 2022-12-08 MED ORDER — SIMVASTATIN 20 MG PO TABS
20.0000 mg | ORAL_TABLET | Freq: Every evening | ORAL | Status: DC
  Administered 2022-12-08 – 2022-12-12 (×5): 20 mg via ORAL
  Filled 2022-12-08 (×5): qty 1

## 2022-12-08 MED ORDER — HEPARIN SODIUM (PORCINE) 5000 UNIT/ML IJ SOLN
5000.0000 [IU] | Freq: Two times a day (BID) | INTRAMUSCULAR | Status: DC
  Administered 2022-12-08 – 2022-12-13 (×10): 5000 [IU] via SUBCUTANEOUS
  Filled 2022-12-08 (×10): qty 1

## 2022-12-08 MED ORDER — DOXAZOSIN MESYLATE 4 MG PO TABS
4.0000 mg | ORAL_TABLET | Freq: Every day | ORAL | Status: DC
  Administered 2022-12-09 – 2022-12-13 (×5): 4 mg via ORAL
  Filled 2022-12-08 (×5): qty 1

## 2022-12-08 MED ORDER — ACETAMINOPHEN 500 MG PO TABS: 1000.0000 mg | ORAL_TABLET | Freq: Four times a day (QID) | ORAL | Status: DC | PRN

## 2022-12-08 MED ORDER — DONEPEZIL HCL 5 MG PO TABS
5.0000 mg | ORAL_TABLET | Freq: Every day | ORAL | Status: DC
  Administered 2022-12-08 – 2022-12-12 (×5): 5 mg via ORAL
  Filled 2022-12-08 (×5): qty 1

## 2022-12-08 MED ORDER — SODIUM CHLORIDE 0.9% FLUSH
3.0000 mL | Freq: Two times a day (BID) | INTRAVENOUS | Status: DC
  Administered 2022-12-08 – 2022-12-13 (×6): 3 mL via INTRAVENOUS

## 2022-12-08 MED ORDER — POTASSIUM CHLORIDE CRYS ER 20 MEQ PO TBCR
40.0000 meq | EXTENDED_RELEASE_TABLET | Freq: Once | ORAL | Status: AC
  Administered 2022-12-08: 40 meq via ORAL
  Filled 2022-12-08: qty 2

## 2022-12-08 MED ORDER — ASPIRIN 81 MG PO CHEW
81.0000 mg | CHEWABLE_TABLET | Freq: Every morning | ORAL | Status: DC
  Administered 2022-12-09 – 2022-12-13 (×5): 81 mg via ORAL
  Filled 2022-12-08 (×5): qty 1

## 2022-12-08 MED ORDER — LEVOTHYROXINE SODIUM 25 MCG PO TABS
25.0000 ug | ORAL_TABLET | Freq: Every day | ORAL | Status: DC
  Administered 2022-12-09 – 2022-12-13 (×5): 25 ug via ORAL
  Filled 2022-12-08 (×5): qty 1

## 2022-12-08 MED ORDER — ALLOPURINOL 300 MG PO TABS
150.0000 mg | ORAL_TABLET | Freq: Every morning | ORAL | Status: DC
  Administered 2022-12-09 – 2022-12-13 (×5): 150 mg via ORAL
  Filled 2022-12-08 (×5): qty 1

## 2022-12-08 MED ORDER — SPIRONOLACTONE 12.5 MG HALF TABLET: 12.5000 mg | ORAL_TABLET | Freq: Every day | ORAL | Status: DC

## 2022-12-08 MED ORDER — PANTOPRAZOLE SODIUM 20 MG PO TBEC
20.0000 mg | DELAYED_RELEASE_TABLET | Freq: Every day | ORAL | Status: DC
  Administered 2022-12-09 – 2022-12-13 (×5): 20 mg via ORAL
  Filled 2022-12-08 (×5): qty 1

## 2022-12-08 MED ORDER — ESCITALOPRAM OXALATE 10 MG PO TABS
10.0000 mg | ORAL_TABLET | Freq: Every day | ORAL | Status: DC
  Administered 2022-12-08 – 2022-12-13 (×6): 10 mg via ORAL
  Filled 2022-12-08 (×6): qty 1

## 2022-12-08 MED ORDER — TORSEMIDE 20 MG PO TABS: 60.0000 mg | ORAL_TABLET | Freq: Two times a day (BID) | ORAL | Status: DC

## 2022-12-08 NOTE — ED Notes (Signed)
ED TO INPATIENT HANDOFF REPORT  ED Nurse Name and Phone #: Gweneth Dimitri Name/Age/Gender Stuart Watson 89 y.o. male Room/Bed: 033C/033C  Code Status   Code Status: DNR  Home/SNF/Other Skilled nursing facility Patient oriented to: self Is this baseline? Yes   Triage Complete: Triage complete  Chief Complaint Syncope [R55]  Triage Note Pt to ER via EMS from [facility].  Pt has hx of being unsteady on his feet, was with staff at sink brushing his teeth when he "tensed up and fell straight back".  Pt did have LOC lasting approximately 5 mins.  Pt has noted hematoma to left side of back of head.  Pt does not take blood thinners.   Allergies No Known Allergies  Level of Care/Admitting Diagnosis ED Disposition     ED Disposition  Admit   Condition  --   Comment  Hospital Area: [facility] [[id number]]  Level of Care: Telemetry Cardiac [103]  May place patient in observation at [facility] or [facility] if equivalent level of care is available:: No  Covid Evaluation: Asymptomatic - no recent exposure (last 10 days) testing not required  Diagnosis: Syncope [206001]  Admitting Physician: Emeline General [[id number]]  Attending Physician: Emeline General [[id number]]          B Medical/Surgery History Past Medical History:  Diagnosis Date   Actinic keratoses    Arthritis    CHF (congestive heart failure) (HCC)    Chronic bilateral pleural effusions    Chronic sinusitis    Colon polyps    Dyspnea 07/13/2013   BNP normal   Edema 07/13/2013   GERD (gastroesophageal reflux disease)    omeprazole 1-2 times daily   H/O TIA (transient ischemic attack) and stroke    on statins and plavix -saw Dr.Sethi in the past   Headache    History of BPH    History of TIA (transient ischemic attack) 07/13/2013   Left inguinal hernia    Left inguinal hernia 01/27/2019   Lumbar radiculopathy    Need for shingles vaccine    Optic neuritis 10/2015   left eye   Perennial  allergic rhinitis    Polymyalgia rheumatica (HCC) 07/13/2013   Daily prednisone   Restless leg syndrome    Retinal disease    right eye since birth    Shingles    Temporal arteritis (HCC) 10/2015   Wears glasses    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   Past Surgical History:  Procedure Laterality Date   BRAVO PH STUDY N/A 04/18/2016   Procedure: BRAVO PH STUDY;  Surgeon: Charlott Rakes, MD;  Location: [facility]COPY;  Service: Endoscopy;  Laterality: N/A;   CHEST TUBE INSERTION Right 02/13/2018   Procedure: INSERTION PLEURAL DRAINAGE CATHETER;  Surgeon: Kerin Perna, MD;  Location: [facility] OR;  Service: Thoracic;  Laterality: Right;   CHEST TUBE INSERTION Left 11/27/2019   Procedure: REDO INSERTION PLEURAL DRAINAGE CATHETER USING 15.5 FR. PLEURX CATH;  Surgeon: Donata Clay, Theron Arista, MD;  Location: [facility] OR;  Service: Thoracic;  Laterality: Left;   COLONOSCOPY     drropy eyelid surgery     ESOPHAGOGASTRODUODENOSCOPY (EGD) WITH PROPOFOL N/A 04/18/2016   Procedure: ESOPHAGOGASTRODUODENOSCOPY (EGD) WITH PROPOFOL;  Surgeon: Charlott Rakes, MD;  Location: [facility] ENDOSCOPY;  Service: Endoscopy;  Laterality: N/A;   INGUINAL HERNIA REPAIR Left 01/27/2019   Procedure: OPEN REPAIR LEFT INGUINAL HERNIA WITH MESH;  Surgeon: Claud Kelp, MD;  Location: [facility] OR;  Service: General;  Laterality:  Left;  GENERAL AND TAP BLOCK ANESTHESIA   INSERTION OF MESH Left 01/27/2019   Procedure: Insertion Of Mesh;  Surgeon: Claud Kelp, MD;  Location: [facility] OR;  Service: General;  Laterality: Left;   IR THORACENTESIS ASP PLEURAL SPACE W/IMG GUIDE  02/04/2018   IR THORACENTESIS ASP PLEURAL SPACE W/IMG GUIDE  02/06/2018   REMOVAL OF PLEURAL DRAINAGE CATHETER Left 11/27/2019   Procedure: REMOVAL OF PLEURAL DRAINAGE CATHETER;  Surgeon: Kerin Perna, MD;  Location: [facility] OR;  Service: Thoracic;  Laterality: Left;   REMOVAL OF PLEURAL DRAINAGE CATHETER Left 03/31/2020   Procedure: REMOVAL OF PLEURAL DRAINAGE CATHETER;  Surgeon: Kerin Perna,  MD;  Location: [facility] OR;  Service: Thoracic;  Laterality: Left;   RIGHT/LEFT HEART CATH AND CORONARY ANGIOGRAPHY N/A 06/24/2018   Procedure: RIGHT/LEFT HEART CATH AND CORONARY ANGIOGRAPHY;  Surgeon: Dolores Patty, MD;  Location: [facility] INVASIVE CV LAB;  Service: Cardiovascular;  Laterality: N/A;   VASECTOMY       A IV Location/Drains/Wounds Patient Lines/Drains/Airways Status     Active Line/Drains/Airways     Name Placement date Placement time Site Days   Peripheral IV 12/08/22 20 G 1" Anterior;Left Forearm 12/08/22  1150  Forearm  less than 1   Chest Tube 2 Left Pleural 15 Fr. 02/13/18  1419  Pleural  1759   Chest Tube 1 Left Pleural 15.5 Fr. 11/27/19  1121  Pleural  1107            Intake/Output Last 24 hours No intake or output data in the 24 hours ending 12/08/22 1459  Labs/Imaging Results for orders placed or performed during the hospital encounter of 12/08/22 (from the past 48 hour(s))  Comprehensive metabolic panel     Status: Abnormal   Collection Time: 12/08/22  9:20 AM  Result Value Ref Range   Sodium 134 (L) 135 - 145 mmol/L   Potassium 2.6 (LL) 3.5 - 5.1 mmol/L    Comment: CRITICAL RESULT CALLED TO, READ BACK BY AND VERIFIED WITH M,Kyran Connaughton RN @1058  12/08/22 E,[place]   Chloride 96 (L) 98 - 111 mmol/L   CO2 25 22 - 32 mmol/L   Glucose, Bld 107 (H) 70 - 99 mg/dL    Comment: Glucose reference range applies only to samples taken after fasting for at least 8 hours.   BUN 51 (H) 8 - 23 mg/dL   Creatinine, Ser 1.61 (H) 0.61 - 1.24 mg/dL   Calcium 8.6 (L) 8.9 - 10.3 mg/dL   Total Protein 6.5 6.5 - 8.1 g/dL   Albumin 3.3 (L) 3.5 - 5.0 g/dL   AST 23 15 - 41 U/L   ALT 13 0 - 44 U/L   Alkaline Phosphatase 53 38 - 126 U/L   Total Bilirubin 0.5 0.3 - 1.2 mg/dL   GFR, Estimated 45 (L) >60 mL/min    Comment: (NOTE) Calculated using the CKD-EPI Creatinine Equation (2021)    Anion gap 13 5 - 15    Comment: Performed at [facility], [address], [place], [place]  [address]  Troponin I (High Sensitivity)     Status: None   Collection Time: 12/08/22  9:20 AM  Result Value Ref Range   Troponin I (High Sensitivity) 9 <18 ng/L    Comment: (NOTE) Elevated high sensitivity troponin I (hsTnI) values and significant  changes across serial measurements may suggest ACS but many other  chronic and acute conditions are known to elevate hsTnI results.  Refer to the "Links" section for chest  pain algorithms and additional  guidance. Performed at [facility], [address] [place], [place] [address]   Brain natriuretic peptide     Status: None   Collection Time: 12/08/22  9:20 AM  Result Value Ref Range   B Natriuretic Peptide 36.4 0.0 - 100.0 pg/mL    Comment: Performed at [facility], [address], [place], [place] [address]  CBC with Differential     Status: Abnormal   Collection Time: 12/08/22  9:20 AM  Result Value Ref Range   WBC 10.6 (H) 4.0 - 10.5 K/uL   RBC 3.82 (L) 4.22 - 5.81 MIL/uL   Hemoglobin 12.7 (L) 13.0 - 17.0 g/dL   HCT 09.8 (L) 11.9 - 14.7 %   MCV 99.5 80.0 - 100.0 fL   MCH 33.2 26.0 - 34.0 pg   MCHC 33.4 30.0 - 36.0 g/dL   RDW 82.9 56.2 - 13.0 %   Platelets 334 150 - 400 K/uL   nRBC 0.0 0.0 - 0.2 %   Neutrophils Relative % 53 %   Neutro Abs 5.8 1.7 - 7.7 K/uL   Lymphocytes Relative 28 %   Lymphs Abs 2.9 0.7 - 4.0 K/uL   Monocytes Relative 7 %   Monocytes Absolute 0.7 0.1 - 1.0 K/uL   Eosinophils Relative 9 %   Eosinophils Absolute 0.9 (H) 0.0 - 0.5 K/uL   Basophils Relative 1 %   Basophils Absolute 0.1 0.0 - 0.1 K/uL   Immature Granulocytes 2 %   Abs Immature Granulocytes 0.19 (H) 0.00 - 0.07 K/uL    Comment: Performed at [facility], [address], [place], [place] [address]  Troponin I (High Sensitivity)     Status: None   Collection Time: 12/08/22 11:35 AM  Result Value Ref Range   Troponin I (High Sensitivity) 10 <18 ng/L    Comment: (NOTE) Elevated high sensitivity troponin I (hsTnI) values and  significant  changes across serial measurements may suggest ACS but many other  chronic and acute conditions are known to elevate hsTnI results.  Refer to the "Links" section for chest pain algorithms and additional  guidance. Performed at [facility], [address], [place], [place] [address]   Magnesium     Status: None   Collection Time: 12/08/22 11:35 AM  Result Value Ref Range   Magnesium 2.4 1.7 - 2.4 mg/dL    Comment: Performed at [facility], [address], [place], [place] [address]   DG Forearm Right  Result Date: 12/08/2022 CLINICAL DATA:  Fall with forearm pain EXAM: RIGHT FOREARM - 2 VIEW COMPARISON:  None Available. FINDINGS: No acute fracture or dislocation. No elbow joint effusion. No significant soft tissue swelling or radiopaque foreign object. IMPRESSION: No acute osseous abnormality. Electronically Signed   By: Jeronimo Greaves M.D.   On: 12/08/2022 11:39   DG Chest Port 1 View  Result Date: 12/08/2022 CLINICAL DATA:  89 year old male status post fall. EXAM: PORTABLE CHEST 1 VIEW COMPARISON:  Cervical spine CT 1004 hours today. Chest radiographs 07/24/2022. FINDINGS: Portable AP view at 1013 hours. Ongoing blunting of both lung bases suggesting chronic pleural effusions. Lung base ventilation appears mildly improved from January. No superimposed pneumothorax, pulmonary edema, air bronchograms. Normal cardiac size and mediastinal contours. Visualized tracheal air column is within normal limits. No acute osseous abnormality identified. Negative visible bowel gas. IMPRESSION: 1. Bilateral pleural effusions persist since January with mildly improved lung base ventilation since that  time. 2. No acute traumatic injury identified. Electronically Signed   By: Odessa Fleming M.D.   On: 12/08/2022 10:21   CT CERVICAL SPINE WO CONTRAST  Result Date: 12/08/2022 CLINICAL DATA:  Polytrauma, blunt.  Fall. EXAM: CT CERVICAL SPINE WITHOUT CONTRAST TECHNIQUE: Multidetector CT imaging  of the cervical spine was performed without intravenous contrast. Multiplanar CT image reconstructions were also generated. RADIATION DOSE REDUCTION: This exam was performed according to the departmental dose-optimization program which includes automated exposure control, adjustment of the mA and/or kV according to patient size and/or use of iterative reconstruction technique. COMPARISON:  06/25/2022 FINDINGS: Alignment: No subluxation. Skull base and vertebrae: No acute fracture. No primary bone lesion or focal pathologic process. Soft tissues and spinal canal: No prevertebral fluid or swelling. No visible canal hematoma. Disc levels: Moderate to advanced diffuse degenerative disc disease with disc space narrowing and spurring, and bilateral degenerative facet disease. Upper chest: No acute findings. Other: None IMPRESSION: Diffuse cervical spondylosis.  No acute bony abnormality. Electronically Signed   By: Charlett Nose M.D.   On: 12/08/2022 10:12   CT HEAD WO CONTRAST  Result Date: 12/08/2022 CLINICAL DATA:  Head trauma, moderate-severe.  Fall. EXAM: CT HEAD WITHOUT CONTRAST TECHNIQUE: Contiguous axial images were obtained from the base of the skull through the vertex without intravenous contrast. RADIATION DOSE REDUCTION: This exam was performed according to the departmental dose-optimization program which includes automated exposure control, adjustment of the mA and/or kV according to patient size and/or use of iterative reconstruction technique. COMPARISON:  06/25/2022 FINDINGS: Brain: There is atrophy and chronic small vessel disease changes. No acute intracranial abnormality. Specifically, no hemorrhage, hydrocephalus, mass lesion, acute infarction, or significant intracranial injury. Vascular: No hyperdense vessel or unexpected calcification. Skull: No acute calvarial abnormality. Sinuses/Orbits: No acute findings. Mucosal thickening throughout the paranasal sinuses. Mastoid air cells clear. Other: Soft  tissue swelling in the left posterior parietal scalp. IMPRESSION: Atrophy, chronic microvascular disease. No acute intracranial abnormality. Electronically Signed   By: Charlett Nose M.D.   On: 12/08/2022 10:10    Pending Labs Unresulted Labs (From admission, onward)     Start     Ordered   12/09/22 0500  Basic metabolic panel  Tomorrow morning,   R        12/08/22 1434   12/08/22 2200  Potassium  Once-Timed,   STAT        12/08/22 1431   12/08/22 0932  Urinalysis, w/ Reflex to Culture (Infection Suspected) -Urine, Clean Catch  Once,   URGENT       Question:  Specimen Source  Answer:  Urine, Clean Catch   12/08/22 0932            Vitals/Pain Today's Vitals   12/08/22 1230 12/08/22 1315 12/08/22 1345 12/08/22 1429  BP: 102/62 108/61 109/61   Pulse: 72 69 69   Resp: 16 16 17    Temp:    98 F (36.7 C)  TempSrc:      SpO2: 100% 99% 100%   Weight:      Height:        Isolation Precautions No active isolations  Medications Medications  potassium chloride 10 mEq in 100 mL IVPB (10 mEq Intravenous New Bag/Given 12/08/22 1353)  acetaminophen (TYLENOL) tablet 1,000 mg (has no administration in time range)  allopurinol (ZYLOPRIM) tablet 150 mg (has no administration in time range)  aspirin chewable tablet 81 mg (has no administration in time range)  doxazosin (CARDURA) tablet 4 mg (has no  administration in time range)  simvastatin (ZOCOR) tablet 20 mg (has no administration in time range)  escitalopram (LEXAPRO) tablet 10 mg (has no administration in time range)  memantine (NAMENDA) tablet 10 mg (has no administration in time range)  levothyroxine (SYNTHROID) tablet 25 mcg (has no administration in time range)  pantoprazole (PROTONIX) EC tablet 20 mg (has no administration in time range)  potassium chloride SA (KLOR-CON M) CR tablet 40 mEq (has no administration in time range)  sodium chloride flush (NS) 0.9 % injection 3 mL (has no administration in time range)  heparin  injection 5,000 Units (has no administration in time range)  donepezil (ARICEPT) tablet 5 mg (has no administration in time range)  potassium chloride SA (KLOR-CON M) CR tablet 40 mEq (40 mEq Oral Given 12/08/22 1200)    Mobility walks with person assist     Focused Assessments Fall, laceration to the head   R Recommendations: See Admitting Provider Note  Report given to:   Additional Notes: Pt fell this morning, staples to the back of the head, K 2.6, getting runs now, family at bedside,incont to a brief, meds with apple sauce, son at bedside now

## 2022-12-08 NOTE — ED Notes (Signed)
Patient currently eating lunch 

## 2022-12-08 NOTE — ED Notes (Signed)
Patient leaving in stable condition with his belongings, family and staff.

## 2022-12-08 NOTE — ED Triage Notes (Signed)
Pt to ER via EMS from [facility].  Pt has hx of being unsteady on his feet, was with staff at sink brushing his teeth when he "tensed up and fell straight back".  Pt did have LOC lasting approximately 5 mins.  Pt has noted hematoma to left side of back of head.  Pt does not take blood thinners.

## 2022-12-08 NOTE — H&P (Signed)
History and Physical    Stuart Watson:[id number] DOB: [birth year] DOA: 12/08/2022  PCP: Mahlon Gammon, MD (Confirm with patient/family/NH records and if not entered, this has to be entered at [facility] point of entry) Patient coming from: Assisted living  I have personally briefly reviewed patient's old medical records in [facility]  Chief Complaint: Patient had a syncope today  HPI: JEB ROESEL is a 89 y.o. male with medical history significant of advanced dementia, temporal arthritis, bilateral benign pleural effusion s/p Pleurx, chronic HFpEF, CKD stage IIIb, HTN, BPH, brought in to ED after a syncope episode.  Patient has advanced dementia and nonverbal at baseline.  History provided by son at bedside.  Patient had witnessed syncope episode and fell backward while being helped by facility staff to brush teeth this morning, during the episode he hit his backside of the head with scalp laceration and bleeding and hematoma.  No reported LOC.  Son reported that for last 1 to 2 weeks patient has been weaker than his baseline, has to be held up from sitting to standing and had several episodes of near falls.  Other than this, patient has been eating and drinking as usual, did not complain any pains.  No changes of his CHF/diuresis medication recently.  ED Course: Afebrile, none tachycardia nonhypotensive O2 (98% on room air.  Chest x-ray showed small left-sided pleural effusion but no other signs of pulmonary congestion.  Blood work showed K2.6, magnesium 2.0, creatinine 1.4 about his baseline, BUN 51, WBC 10.6.  Patient was given both IV and p.o. K replacement  Review of Systems: Unable to perform, baseline nonverbal and advanced dementia.  Past Medical History:  Diagnosis Date   Actinic keratoses    Arthritis    CHF (congestive heart failure) (HCC)    Chronic bilateral pleural effusions    Chronic sinusitis    Colon polyps    Dyspnea 07/13/2013   BNP normal   Edema 07/13/2013    GERD (gastroesophageal reflux disease)    omeprazole 1-2 times daily   H/O TIA (transient ischemic attack) and stroke    on statins and plavix -saw Dr.Sethi in the past   Headache    History of BPH    History of TIA (transient ischemic attack) 07/13/2013   Left inguinal hernia    Left inguinal hernia 01/27/2019   Lumbar radiculopathy    Need for shingles vaccine    Optic neuritis 10/2015   left eye   Perennial allergic rhinitis    Polymyalgia rheumatica (HCC) 07/13/2013   Daily prednisone   Restless leg syndrome    Retinal disease    right eye since birth    Shingles    Temporal arteritis (HCC) 10/2015   Wears glasses    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    Past Surgical History:  Procedure Laterality Date   BRAVO PH STUDY N/A 04/18/2016   Procedure: BRAVO PH STUDY;  Surgeon: Charlott Rakes, MD;  Location: [facility] ENDOSCOPY;  Service: Endoscopy;  Laterality: N/A;   CHEST TUBE INSERTION Right 02/13/2018   Procedure: INSERTION PLEURAL DRAINAGE CATHETER;  Surgeon: Kerin Perna, MD;  Location: [facility] OR;  Service: Thoracic;  Laterality: Right;   CHEST TUBE INSERTION Left 11/27/2019   Procedure: REDO INSERTION PLEURAL DRAINAGE CATHETER USING 15.5 FR. PLEURX CATH;  Surgeon: Donata Clay, Theron Arista, MD;  Location: [facility] OR;  Service: Thoracic;  Laterality: Left;   COLONOSCOPY     drropy eyelid surgery     ESOPHAGOGASTRODUODENOSCOPY (EGD)  WITH PROPOFOL N/A 04/18/2016   Procedure: ESOPHAGOGASTRODUODENOSCOPY (EGD) WITH PROPOFOL;  Surgeon: Charlott Rakes, MD;  Location: [facility] ENDOSCOPY;  Service: Endoscopy;  Laterality: N/A;   INGUINAL HERNIA REPAIR Left 01/27/2019   Procedure: OPEN REPAIR LEFT INGUINAL HERNIA WITH MESH;  Surgeon: Claud Kelp, MD;  Location: [facility] OR;  Service: General;  Laterality: Left;  GENERAL AND TAP BLOCK ANESTHESIA   INSERTION OF MESH Left 01/27/2019   Procedure: Insertion Of Mesh;  Surgeon: Claud Kelp, MD;  Location: [facility] OR;  Service: General;  Laterality: Left;   IR THORACENTESIS  ASP PLEURAL SPACE W/IMG GUIDE  02/04/2018   IR THORACENTESIS ASP PLEURAL SPACE W/IMG GUIDE  02/06/2018   REMOVAL OF PLEURAL DRAINAGE CATHETER Left 11/27/2019   Procedure: REMOVAL OF PLEURAL DRAINAGE CATHETER;  Surgeon: Kerin Perna, MD;  Location: [facility] OR;  Service: Thoracic;  Laterality: Left;   REMOVAL OF PLEURAL DRAINAGE CATHETER Left 03/31/2020   Procedure: REMOVAL OF PLEURAL DRAINAGE CATHETER;  Surgeon: Kerin Perna, MD;  Location: [facility] OR;  Service: Thoracic;  Laterality: Left;   RIGHT/LEFT HEART CATH AND CORONARY ANGIOGRAPHY N/A 06/24/2018   Procedure: RIGHT/LEFT HEART CATH AND CORONARY ANGIOGRAPHY;  Surgeon: Dolores Patty, MD;  Location: [facility] INVASIVE CV LAB;  Service: Cardiovascular;  Laterality: N/A;   VASECTOMY       reports that he quit smoking about 47 years ago. His smoking use included cigarettes. He has a 15.00 pack-year smoking history. He has never used smokeless tobacco. He reports current alcohol use of about 7.0 standard drinks of alcohol per week. He reports that he does not use drugs.  No Known Allergies  Family History  Problem Relation Age of Onset   Stroke Mother    Coronary artery disease Mother    Diabetes Mother    Breast cancer Mother    Heart attack Father    Coronary artery disease Father    Diabetes Father    Luiz Blare' disease Sister      Prior to Admission medications   Medication Sig Start Date End Date Taking? Authorizing Provider  acetaminophen (TYLENOL) 500 MG tablet Take 2 tablets (1,000 mg total) by mouth in the morning and at bedtime. 06/25/22  Yes Fargo, Amy E, NP  allopurinol (ZYLOPRIM) 300 MG tablet Take 150 mg by mouth in the morning. For gout   Yes [provider]  aspirin 81 MG chewable tablet Chew 81 mg by mouth in the morning. Chew and swallow tablet   Yes [provider]  donepezil (ARICEPT ODT) 5 MG disintegrating tablet Take 1 tablet (5 mg total) by mouth at bedtime. For co++ + Patient taking differently: Take 5 mg  by mouth at bedtime. 12/07/22  Yes Fargo, Amy E, NP  doxazosin (CARDURA) 4 MG tablet Take 4 mg by mouth daily.   Yes [provider]  escitalopram (LEXAPRO) 10 MG tablet Take 10 mg by mouth daily.   Yes [provider]  levothyroxine (SYNTHROID) 25 MCG tablet Take 25 mcg by mouth daily before breakfast. 12/29/19  Yes [provider]  memantine (NAMENDA) 10 MG tablet Take 1 tablet (10 mg total) by mouth 2 (two) times daily. 08/13/22  Yes Mahlon Gammon, MD  metolazone (ZAROXOLYN) 2.5 MG tablet TAKE 1 TABLET BY MOUTH EVERY  WEDNESDAY Patient taking differently: Take 2.5 mg by mouth once a week. wednesday 11/19/22  Yes Bensimhon, Bevelyn Buckles, MD  pantoprazole (PROTONIX) 20 MG tablet Take 20 mg by mouth daily before breakfast.   Yes [provider]  potassium chloride SA (KLOR-CON M) 20 MEQ tablet Take 40 mEq by mouth daily.   Yes [provider]  simvastatin (ZOCOR) 20 MG tablet Take 20 mg by mouth every evening.   Yes [provider]  spironolactone (ALDACTONE) 25 MG tablet TAKE ONE-HALF TABLET BY MOUTH  DAILY 10/23/22  Yes Mahlon Gammon, MD  torsemide (DEMADEX) 20 MG tablet Take 60 mg by mouth 2 (two) times daily. Give 3 tablet by mouth   Yes [provider]    Physical Exam: Vitals:   12/08/22 1230 12/08/22 1315 12/08/22 1345 12/08/22 1429  BP: 102/62 108/61 109/61   Pulse: 72 69 69   Resp: 16 16 17    Temp:    98 F (36.7 C)  TempSrc:      SpO2: 100% 99% 100%   Weight:      Height:        Constitutional: NAD, calm, comfortable Vitals:   12/08/22 1230 12/08/22 1315 12/08/22 1345 12/08/22 1429  BP: 102/62 108/61 109/61   Pulse: 72 69 69   Resp: 16 16 17    Temp:    98 F (36.7 C)  TempSrc:      SpO2: 100% 99% 100%   Weight:      Height:       Eyes: PERRL, lids and conjunctivae normal ENMT: Mucous membranes are moist. Posterior pharynx clear of any exudate or lesions.Normal dentition.  Neck: normal, supple, no masses, no  thyromegaly Respiratory: clear to auscultation bilaterally, no wheezing, no crackles. Normal respiratory effort. No accessory muscle use.  Cardiovascular: Regular rate and rhythm, loud systolic murmur on apex.  1+ extremity edema. 2+ pedal pulses. No carotid bruits.  Abdomen: no tenderness, no masses palpated. No hepatosplenomegaly. Bowel sounds positive.  Musculoskeletal: no clubbing / cyanosis. No joint deformity upper and lower extremities. Good ROM, no contractures. Normal muscle tone.  Skin: no rashes, lesions, ulcers. No induration Neurologic: No facial droops, moving all limbs Psychiatric: Awake, nonverbal    Labs on Admission: I have personally reviewed following labs and imaging studies  CBC: Recent Labs  Lab 12/08/22 0920  WBC 10.6*  NEUTROABS 5.8  HGB 12.7*  HCT 38.0*  MCV 99.5  PLT 334   Basic Metabolic Panel: Recent Labs  Lab 12/08/22 0920 12/08/22 1135  NA 134*  --   K 2.6*  --   CL 96*  --   CO2 25  --   GLUCOSE 107*  --   BUN 51*  --   CREATININE 1.48*  --   CALCIUM 8.6*  --   MG  --  2.4   GFR: Estimated Creatinine Clearance: 29.3 mL/min (A) (by C-G formula based on SCr of 1.48 mg/dL (H)). Liver Function Tests: Recent Labs  Lab 12/08/22 0920  AST 23  ALT 13  ALKPHOS 53  BILITOT 0.5  PROT 6.5  ALBUMIN 3.3*   No results for input(s): "LIPASE", "AMYLASE" in the last 168 hours. No results for input(s): "AMMONIA" in the last 168 hours. Coagulation Profile: No results for input(s): "INR", "PROTIME" in the last 168 hours. Cardiac Enzymes: No results for input(s): "CKTOTAL", "CKMB", "CKMBINDEX", "TROPONINI" in the last 168 hours. BNP (last 3 results) No results for input(s): "PROBNP" in the last 8760 hours. HbA1C: No results for input(s): "HGBA1C" in the last 72 hours. CBG: No results for input(s): "GLUCAP" in the last 168 hours. Lipid Profile: No results for input(s): "CHOL", "HDL", "LDLCALC", "TRIG", "CHOLHDL", "LDLDIRECT" in the last 72  hours.  Thyroid Function Tests: No results for input(s): "TSH", "T4TOTAL", "FREET4", "T3FREE", "THYROIDAB" in the last 72 hours. Anemia Panel: No results for input(s): "VITAMINB12", "FOLATE", "FERRITIN", "TIBC", "IRON", "RETICCTPCT" in the last 72 hours. Urine analysis:    Component Value Date/Time   COLORURINE YELLOW 11/27/2019 1005   APPEARANCEUR CLEAR 11/27/2019 1005   LABSPEC 1.023 11/27/2019 1005   PHURINE 6.0 11/27/2019 1005   GLUCOSEU NEGATIVE 11/27/2019 1005   HGBUR NEGATIVE 11/27/2019 1005   BILIRUBINUR NEGATIVE 11/27/2019 1005   KETONESUR NEGATIVE 11/27/2019 1005   PROTEINUR NEGATIVE 11/27/2019 1005   NITRITE NEGATIVE 11/27/2019 1005   LEUKOCYTESUR NEGATIVE 11/27/2019 1005    Radiological Exams on Admission: DG Forearm Right  Result Date: 12/08/2022 CLINICAL DATA:  Fall with forearm pain EXAM: RIGHT FOREARM - 2 VIEW COMPARISON:  None Available. FINDINGS: No acute fracture or dislocation. No elbow joint effusion. No significant soft tissue swelling or radiopaque foreign object. IMPRESSION: No acute osseous abnormality. Electronically Signed   By: Jeronimo Greaves M.D.   On: 12/08/2022 11:39   DG Chest Port 1 View  Result Date: 12/08/2022 CLINICAL DATA:  89 year old male status post fall. EXAM: PORTABLE CHEST 1 VIEW COMPARISON:  Cervical spine CT 1004 hours today. Chest radiographs 07/24/2022. FINDINGS: Portable AP view at 1013 hours. Ongoing blunting of both lung bases suggesting chronic pleural effusions. Lung base ventilation appears mildly improved from January. No superimposed pneumothorax, pulmonary edema, air bronchograms. Normal cardiac size and mediastinal contours. Visualized tracheal air column is within normal limits. No acute osseous abnormality identified. Negative visible bowel gas. IMPRESSION: 1. Bilateral pleural effusions persist since January with mildly improved lung base ventilation since that time. 2. No acute traumatic injury identified. Electronically Signed    By: Odessa Fleming M.D.   On: 12/08/2022 10:21   CT CERVICAL SPINE WO CONTRAST  Result Date: 12/08/2022 CLINICAL DATA:  Polytrauma, blunt.  Fall. EXAM: CT CERVICAL SPINE WITHOUT CONTRAST TECHNIQUE: Multidetector CT imaging of the cervical spine was performed without intravenous contrast. Multiplanar CT image reconstructions were also generated. RADIATION DOSE REDUCTION: This exam was performed according to the departmental dose-optimization program which includes automated exposure control, adjustment of the mA and/or kV according to patient size and/or use of iterative reconstruction technique. COMPARISON:  06/25/2022 FINDINGS: Alignment: No subluxation. Skull base and vertebrae: No acute fracture. No primary bone lesion or focal pathologic process. Soft tissues and spinal canal: No prevertebral fluid or swelling. No visible canal hematoma. Disc levels: Moderate to advanced diffuse degenerative disc disease with disc space narrowing and spurring, and bilateral degenerative facet disease. Upper chest: No acute findings. Other: None IMPRESSION: Diffuse cervical spondylosis.  No acute bony abnormality. Electronically Signed   By: Charlett Nose M.D.   On: 12/08/2022 10:12   CT HEAD WO CONTRAST  Result Date: 12/08/2022 CLINICAL DATA:  Head trauma, moderate-severe.  Fall. EXAM: CT HEAD WITHOUT CONTRAST TECHNIQUE: Contiguous axial images were obtained from the base of the skull through the vertex without intravenous contrast. RADIATION DOSE REDUCTION: This exam was performed according to the departmental dose-optimization program which includes automated exposure control, adjustment of the mA and/or kV according to patient size and/or use of iterative reconstruction technique. COMPARISON:  06/25/2022 FINDINGS: Brain: There is atrophy and chronic small vessel disease changes. No acute intracranial abnormality. Specifically, no hemorrhage, hydrocephalus, mass lesion, acute infarction, or significant intracranial injury.  Vascular: No hyperdense vessel or unexpected calcification. Skull: No acute calvarial abnormality. Sinuses/Orbits: No acute findings. Mucosal thickening throughout the paranasal sinuses. Mastoid air cells  clear. Other: Soft tissue swelling in the left posterior parietal scalp. IMPRESSION: Atrophy, chronic microvascular disease. No acute intracranial abnormality. Electronically Signed   By: Charlett Nose M.D.   On: 12/08/2022 10:10    EKG: Independently reviewed.  Sinus rhythm, flattened T waves on all leads  Assessment/Plan Principal Problem:   Syncope Active Problems:   CKD (chronic kidney disease) stage 3, GFR 30-59 ml/min (HCC)   Hypokalemia  (please populate well all problems here in Problem List. (For example, if patient is on BP meds at home and you resume or decide to hold them, it is a problem that needs to be her. Same for CAD, COPD, HLD and so on)  Severe hypokalemia -Probably related to chronic diuresis -Continue PO and IV replacement -Recheck K level at 2200 and in AM, continue to replace if needd -Mg ok, will check Phos  Syncope -Probably related to arrhythmia secondary to severe hyponatremia.  Replace K as above, continue telemonitoring x 24 hours. -Vasovagal also possible as this happened while patient was being helped to brush his teeth -Other DDx, review patient's old echo record did not show any valvular abnormalities, and physical exam showed patient has a loud systolic murmur on apex, suspect new mitral regurgitation, check echocardiogram -Orthostatic vital signs  Chronic HFpEF/HTN -Blood pressure borderline low, will hold off diuresis and spironolactone today.  Discussed with pharmacy  BPH -Continue doxazosin but recommend consider change to Flomax given risk of fall.  Advanced dementia -Mentation is baseline as per family, continue Aricept and memantine  Hypothyroidism -Continue Synthroid  CKD stage IIIb -Creatinine level stable, euvolemic-mild overload, hold  off diuresis today for borderline low blood pressure  DVT prophylaxis: Heparin subcu Code Status: DNR Family Communication: Son at bedside Disposition Plan: Expect less than 2 midnight hospital stay Consults called: None Admission status: Telemetry observation   Emeline General MD [facility] Pager [phone]  12/08/2022, 3:10 PM

## 2022-12-08 NOTE — ED Provider Notes (Signed)
[place] EMERGENCY DEPARTMENT AT [facility] Provider Note   CSN: [id number] Arrival date & time: 12/08/22  4098     History  Chief Complaint  Patient presents with   Fall   Loss of Consciousness    Stuart Watson is a 89 y.o. male.   Fall  Loss of Consciousness    89 year old male with medical history significant for dementia, TIA, polymyalgia rheumatica, CHF who presents to the emergency department after syncopal episode.  [facility] when he was noted to have been in the bathroom and tensed up, losing consciousness and falling straight backwards.  He had positive LOC for 5 minutes.  He sustained an abrasion/laceration with associated hematoma to the posterior occiput.  His tetanus is up-to-date.  He has since returned close to his baseline per his son who presents bedside with him.  He arrives GCS 14, ABC intact.  Home Medications Prior to Admission medications   Medication Sig Start Date End Date Taking? Authorizing Provider  acetaminophen (TYLENOL) 500 MG tablet Take 2 tablets (1,000 mg total) by mouth in the morning and at bedtime. 06/25/22  Yes Fargo, Amy E, NP  allopurinol (ZYLOPRIM) 300 MG tablet Take 150 mg by mouth in the morning. For gout   Yes [provider]  aspirin 81 MG chewable tablet Chew 81 mg by mouth in the morning. Chew and swallow tablet   Yes [provider]  donepezil (ARICEPT ODT) 5 MG disintegrating tablet Take 1 tablet (5 mg total) by mouth at bedtime. For co++ + Patient taking differently: Take 5 mg by mouth at bedtime. 12/07/22  Yes Fargo, Amy E, NP  doxazosin (CARDURA) 4 MG tablet Take 4 mg by mouth daily.   Yes [provider]  escitalopram (LEXAPRO) 10 MG tablet Take 10 mg by mouth daily.   Yes [provider]  levothyroxine (SYNTHROID) 25 MCG tablet Take 25 mcg by mouth daily before breakfast. 12/29/19  Yes [provider]  memantine (NAMENDA) 10 MG  tablet Take 1 tablet (10 mg total) by mouth 2 (two) times daily. 08/13/22  Yes Mahlon Gammon, MD  metolazone (ZAROXOLYN) 2.5 MG tablet TAKE 1 TABLET BY MOUTH EVERY  WEDNESDAY Patient taking differently: Take 2.5 mg by mouth once a week. wednesday 11/19/22  Yes Bensimhon, Bevelyn Buckles, MD  pantoprazole (PROTONIX) 20 MG tablet Take 20 mg by mouth daily before breakfast.   Yes [provider]  potassium chloride SA (KLOR-CON M) 20 MEQ tablet Take 40 mEq by mouth daily.   Yes [provider]  simvastatin (ZOCOR) 20 MG tablet Take 20 mg by mouth every evening.   Yes [provider]  spironolactone (ALDACTONE) 25 MG tablet TAKE ONE-HALF TABLET BY MOUTH  DAILY 10/23/22  Yes Mahlon Gammon, MD  torsemide (DEMADEX) 20 MG tablet Take 60 mg by mouth 2 (two) times daily. Give 3 tablet by mouth   Yes [provider]      Allergies    Patient has no known allergies.    Review of Systems   Review of Systems  Unable to perform ROS: Dementia  Cardiovascular:  Positive for syncope.    Physical Exam Updated Vital Signs BP (!) 91/49 (BP Location: Right Arm)   Pulse 76   Temp 98.6 F (37 C) (Oral)   Resp 19   Ht 5\' 9"  (1.753 m)   Wt 60 kg   SpO2 100%   BMI 19.53 kg/m  Physical Exam Vitals and nursing note reviewed.  Constitutional:      Appearance: He is well-developed.     Comments: GCS 15, ABC intact  HENT:     Head: Normocephalic.     Comments: Posterior occipital hematoma and abrasion with matted blood present Eyes:     Conjunctiva/sclera: Conjunctivae normal.  Neck:     Comments: No midline tenderness to palpation of the cervical spine. C-collar in place Cardiovascular:     Rate and Rhythm: Normal rate and regular rhythm.  Pulmonary:     Effort: Pulmonary effort is normal. No respiratory distress.     Breath sounds: Normal breath sounds.  Chest:     Comments: Chest wall stable and non-tender to AP and lateral compression. Clavicles stable and non-tender  to AP compression Abdominal:     Palpations: Abdomen is soft.     Tenderness: There is no abdominal tenderness.     Comments: Pelvis stable to lateral compression.  Musculoskeletal:     Cervical back: Neck supple.     Comments: No midline tenderness to palpation of the thoracic or lumbar spine. Extremities atraumatic with intact ROM with the exception of skin tear to right forearm  Skin:    General: Skin is warm and dry.  Neurological:     Mental Status: He is alert.     Comments: CN II-XII grossly intact. Moving all four extremities spontaneously and sensation grossly intact.     ED Results / Procedures / Treatments   Labs (all labs ordered are listed, but only abnormal results are displayed) Labs Reviewed  COMPREHENSIVE METABOLIC PANEL - Abnormal; Notable for the following components:      Result Value   Sodium 134 (*)    Potassium 2.6 (*)    Chloride 96 (*)    Glucose, Bld 107 (*)    BUN 51 (*)    Creatinine, Ser 1.48 (*)    Calcium 8.6 (*)    Albumin 3.3 (*)    GFR, Estimated 45 (*)    All other components within normal limits  CBC WITH DIFFERENTIAL/PLATELET - Abnormal; Notable for the following components:   WBC 10.6 (*)    RBC 3.82 (*)    Hemoglobin 12.7 (*)    HCT 38.0 (*)    Eosinophils Absolute 0.9 (*)    Abs Immature Granulocytes 0.19 (*)    All other components within normal limits  BRAIN NATRIURETIC PEPTIDE  URINALYSIS, W/ REFLEX TO CULTURE (INFECTION SUSPECTED)  MAGNESIUM  PHOSPHORUS  POTASSIUM  BASIC METABOLIC PANEL  TROPONIN I (HIGH SENSITIVITY)  TROPONIN I (HIGH SENSITIVITY)    EKG EKG Interpretation  Date/Time:  Saturday Dec 08 2022 09:44:35 EDT Ventricular Rate:  64 PR Interval:  180 QRS Duration: 111 QT Interval:  453 QTC Calculation: 468 R Axis:   111 Text Interpretation: Sinus rhythm Consider right ventricular hypertrophy T wave flattening, new compared to prior EKG Confirmed by Ernie Avena ([id number]) on 12/08/2022 10:28:06  AM  Radiology DG Forearm Right  Result Date: 12/08/2022 CLINICAL DATA:  Fall with forearm pain EXAM: RIGHT FOREARM - 2 VIEW COMPARISON:  None Available. FINDINGS: No acute fracture or dislocation. No elbow joint effusion. No significant soft tissue swelling or radiopaque foreign object. IMPRESSION: No acute osseous abnormality. Electronically Signed   By: Jeronimo Greaves M.D.   On: 12/08/2022 11:39   DG Chest Port 1 View  Result Date: 12/08/2022 CLINICAL DATA:  89 year old male status post fall. EXAM: PORTABLE CHEST 1 VIEW COMPARISON:  Cervical  spine CT 1004 hours today. Chest radiographs 07/24/2022. FINDINGS: Portable AP view at 1013 hours. Ongoing blunting of both lung bases suggesting chronic pleural effusions. Lung base ventilation appears mildly improved from January. No superimposed pneumothorax, pulmonary edema, air bronchograms. Normal cardiac size and mediastinal contours. Visualized tracheal air column is within normal limits. No acute osseous abnormality identified. Negative visible bowel gas. IMPRESSION: 1. Bilateral pleural effusions persist since January with mildly improved lung base ventilation since that time. 2. No acute traumatic injury identified. Electronically Signed   By: Odessa Fleming M.D.   On: 12/08/2022 10:21   CT CERVICAL SPINE WO CONTRAST  Result Date: 12/08/2022 CLINICAL DATA:  Polytrauma, blunt.  Fall. EXAM: CT CERVICAL SPINE WITHOUT CONTRAST TECHNIQUE: Multidetector CT imaging of the cervical spine was performed without intravenous contrast. Multiplanar CT image reconstructions were also generated. RADIATION DOSE REDUCTION: This exam was performed according to the departmental dose-optimization program which includes automated exposure control, adjustment of the mA and/or kV according to patient size and/or use of iterative reconstruction technique. COMPARISON:  06/25/2022 FINDINGS: Alignment: No subluxation. Skull base and vertebrae: No acute fracture. No primary bone lesion or  focal pathologic process. Soft tissues and spinal canal: No prevertebral fluid or swelling. No visible canal hematoma. Disc levels: Moderate to advanced diffuse degenerative disc disease with disc space narrowing and spurring, and bilateral degenerative facet disease. Upper chest: No acute findings. Other: None IMPRESSION: Diffuse cervical spondylosis.  No acute bony abnormality. Electronically Signed   By: Charlett Nose M.D.   On: 12/08/2022 10:12   CT HEAD WO CONTRAST  Result Date: 12/08/2022 CLINICAL DATA:  Head trauma, moderate-severe.  Fall. EXAM: CT HEAD WITHOUT CONTRAST TECHNIQUE: Contiguous axial images were obtained from the base of the skull through the vertex without intravenous contrast. RADIATION DOSE REDUCTION: This exam was performed according to the departmental dose-optimization program which includes automated exposure control, adjustment of the mA and/or kV according to patient size and/or use of iterative reconstruction technique. COMPARISON:  06/25/2022 FINDINGS: Brain: There is atrophy and chronic small vessel disease changes. No acute intracranial abnormality. Specifically, no hemorrhage, hydrocephalus, mass lesion, acute infarction, or significant intracranial injury. Vascular: No hyperdense vessel or unexpected calcification. Skull: No acute calvarial abnormality. Sinuses/Orbits: No acute findings. Mucosal thickening throughout the paranasal sinuses. Mastoid air cells clear. Other: Soft tissue swelling in the left posterior parietal scalp. IMPRESSION: Atrophy, chronic microvascular disease. No acute intracranial abnormality. Electronically Signed   By: Charlett Nose M.D.   On: 12/08/2022 10:10    Procedures .Marland KitchenLaceration Repair  Date/Time: 12/08/2022 12:43 PM  Performed by: Ernie Avena, MD Authorized by: Ernie Avena, MD   Consent:    Consent obtained:  Verbal   Consent given by:  Healthcare agent   Risks discussed:  Infection Universal protocol:    Patient identity  confirmed:  Arm band Laceration details:    Location:  Scalp   Scalp location:  Occipital   Length (cm):  4   Depth (mm):  2 Treatment:    Area cleansed with:  Saline   Amount of cleaning:  Standard Skin repair:    Repair method:  Staples   Number of staples:  9 Approximation:    Approximation:  Close Repair type:    Repair type:  Simple Post-procedure details:    Dressing:  Open (no dressing)   Procedure completion:  Tolerated     Medications Ordered in ED Medications  acetaminophen (TYLENOL) tablet 1,000 mg (has no administration in time range)  allopurinol (  ZYLOPRIM) tablet 150 mg (has no administration in time range)  aspirin chewable tablet 81 mg (has no administration in time range)  doxazosin (CARDURA) tablet 4 mg (has no administration in time range)  simvastatin (ZOCOR) tablet 20 mg (20 mg Oral Given 12/08/22 1740)  escitalopram (LEXAPRO) tablet 10 mg (10 mg Oral Given 12/08/22 1741)  memantine (NAMENDA) tablet 10 mg (has no administration in time range)  levothyroxine (SYNTHROID) tablet 25 mcg (has no administration in time range)  pantoprazole (PROTONIX) EC tablet 20 mg (has no administration in time range)  potassium chloride SA (KLOR-CON M) CR tablet 40 mEq (has no administration in time range)  sodium chloride flush (NS) 0.9 % injection 3 mL (3 mLs Intravenous Not Given 12/08/22 1555)  heparin injection 5,000 Units (has no administration in time range)  donepezil (ARICEPT) tablet 5 mg (has no administration in time range)  potassium chloride 10 mEq in 100 mL IVPB (10 mEq Intravenous New Bag/Given 12/08/22 1715)  potassium chloride SA (KLOR-CON M) CR tablet 40 mEq (40 mEq Oral Given 12/08/22 1200)    ED Course/ Medical Decision Making/ A&P                             Medical Decision Making Amount and/or Complexity of Data Reviewed Labs: ordered. Radiology: ordered.  Risk Prescription drug management. Decision regarding hospitalization.     89 year old  male with medical history significant for dementia, TIA, polymyalgia rheumatica, CHF who presents to the emergency department after syncopal episode.  [facility] when he was noted to have been in the bathroom and tensed up, losing consciousness and falling straight backwards.  He had positive LOC for 5 minutes.  He sustained an abrasion/laceration with associated hematoma to the posterior occiput.  His tetanus is up-to-date.  He has since returned close to his baseline per his son who presents bedside with him.  He arrives GCS 14, ABC intact.  Medical Decision Making:   BURFORD WHEATLEY is a 89 y.o. male who presented to the ED today with a syncopal episode detailed above.    Additional history discussed with patient's family/caregivers.  Patient placed on continuous vitals and telemetry monitoring while in ED which was reviewed periodically.  Complete initial physical exam performed, notably the patient  was found to have a scalp laceration.    Reviewed and confirmed nursing documentation for past medical history, family history, social history.    Initial Assessment:   With the patient's presentation of syncope, most likely diagnosis is orthostatic hypotension vs vasovagal episode vs cardiogenic etiology. Other diagnoses were considered including (but not limited to) valvular abnormality, PE, aortic dissection. These are considered less likely due to history of present illness and physical exam findings.    This is most consistent with an acute life/limb threatening illness complicated by underlying chronic conditions.  Initial Plan:  Trauma workup, CT Head and Cervical Spine Laceration repair Screening labs including CBC and Metabolic panel to evaluate for infectious or metabolic etiology of disease.  Urinalysis with reflex culture ordered to evaluate for UTI or relevant urologic/nephrologic pathology.  CXR to evaluate for structural/infectious  intrathoracic pathology.  EKG to evaluate for cardiac pathology.  Troponin and BNP Objective evaluation as below reviewed after administration of IVF/Telemetry monitoring  Initial Study Results:   Laboratory  All laboratory results reviewed without evidence of clinically relevant pathology.   Exceptions include: hypokalemia to 2.6, replenished orally  and IV, mild anemia to 12.7   EKG EKG was reviewed independently. Rate, rhythm, axis, intervals all examined and without medically relevant abnormality. ST segments without concerns for elevations.  Mild T wave flattening present  Radiology:  All images reviewed independently. Agree with radiology report at this time.   DG Forearm Right  Result Date: 12/08/2022 CLINICAL DATA:  Fall with forearm pain EXAM: RIGHT FOREARM - 2 VIEW COMPARISON:  None Available. FINDINGS: No acute fracture or dislocation. No elbow joint effusion. No significant soft tissue swelling or radiopaque foreign object. IMPRESSION: No acute osseous abnormality. Electronically Signed   By: Jeronimo Greaves M.D.   On: 12/08/2022 11:39   DG Chest Port 1 View  Result Date: 12/08/2022 CLINICAL DATA:  89 year old male status post fall. EXAM: PORTABLE CHEST 1 VIEW COMPARISON:  Cervical spine CT 1004 hours today. Chest radiographs 07/24/2022. FINDINGS: Portable AP view at 1013 hours. Ongoing blunting of both lung bases suggesting chronic pleural effusions. Lung base ventilation appears mildly improved from January. No superimposed pneumothorax, pulmonary edema, air bronchograms. Normal cardiac size and mediastinal contours. Visualized tracheal air column is within normal limits. No acute osseous abnormality identified. Negative visible bowel gas. IMPRESSION: 1. Bilateral pleural effusions persist since January with mildly improved lung base ventilation since that time. 2. No acute traumatic injury identified. Electronically Signed   By: Odessa Fleming M.D.   On: 12/08/2022 10:21   CT CERVICAL  SPINE WO CONTRAST  Result Date: 12/08/2022 CLINICAL DATA:  Polytrauma, blunt.  Fall. EXAM: CT CERVICAL SPINE WITHOUT CONTRAST TECHNIQUE: Multidetector CT imaging of the cervical spine was performed without intravenous contrast. Multiplanar CT image reconstructions were also generated. RADIATION DOSE REDUCTION: This exam was performed according to the departmental dose-optimization program which includes automated exposure control, adjustment of the mA and/or kV according to patient size and/or use of iterative reconstruction technique. COMPARISON:  06/25/2022 FINDINGS: Alignment: No subluxation. Skull base and vertebrae: No acute fracture. No primary bone lesion or focal pathologic process. Soft tissues and spinal canal: No prevertebral fluid or swelling. No visible canal hematoma. Disc levels: Moderate to advanced diffuse degenerative disc disease with disc space narrowing and spurring, and bilateral degenerative facet disease. Upper chest: No acute findings. Other: None IMPRESSION: Diffuse cervical spondylosis.  No acute bony abnormality. Electronically Signed   By: Charlett Nose M.D.   On: 12/08/2022 10:12   CT HEAD WO CONTRAST  Result Date: 12/08/2022 CLINICAL DATA:  Head trauma, moderate-severe.  Fall. EXAM: CT HEAD WITHOUT CONTRAST TECHNIQUE: Contiguous axial images were obtained from the base of the skull through the vertex without intravenous contrast. RADIATION DOSE REDUCTION: This exam was performed according to the departmental dose-optimization program which includes automated exposure control, adjustment of the mA and/or kV according to patient size and/or use of iterative reconstruction technique. COMPARISON:  06/25/2022 FINDINGS: Brain: There is atrophy and chronic small vessel disease changes. No acute intracranial abnormality. Specifically, no hemorrhage, hydrocephalus, mass lesion, acute infarction, or significant intracranial injury. Vascular: No hyperdense vessel or unexpected calcification.  Skull: No acute calvarial abnormality. Sinuses/Orbits: No acute findings. Mucosal thickening throughout the paranasal sinuses. Mastoid air cells clear. Other: Soft tissue swelling in the left posterior parietal scalp. IMPRESSION: Atrophy, chronic microvascular disease. No acute intracranial abnormality. Electronically Signed   By: Charlett Nose M.D.   On: 12/08/2022 10:10      The patient's scalp laceration was repaired with staples as per the procedure note above. Pt found to have bilateral pleural effusions, similar to previous,  previously treated with pleur-x catheters with CT surgery. Not worsened since removal.  Final Assessment and Plan:   Plan for admission for telemetry monitoring and continued electrolyte replenishment.     Clinical Impression:  1. Syncope and collapse   2. Fall, initial encounter   3. Laceration of scalp, initial encounter   4. Pleural effusion, bilateral      Admit     Final Clinical Impression(s) / ED Diagnoses Final diagnoses:  Fall, initial encounter  Syncope and collapse  Laceration of scalp, initial encounter  Pleural effusion, bilateral    Rx / DC Orders ED Discharge Orders     None         Ernie Avena, MD 12/08/22 1940

## 2022-12-09 ENCOUNTER — Inpatient Hospital Stay (HOSPITAL_COMMUNITY): Payer: Medicare Other

## 2022-12-09 DIAGNOSIS — N1832 Chronic kidney disease, stage 3b: Secondary | ICD-10-CM | POA: Diagnosis present

## 2022-12-09 DIAGNOSIS — Z91118 Patient's noncompliance with dietary regimen for other reason: Secondary | ICD-10-CM | POA: Diagnosis not present

## 2022-12-09 DIAGNOSIS — W19XXXA Unspecified fall, initial encounter: Secondary | ICD-10-CM | POA: Diagnosis not present

## 2022-12-09 DIAGNOSIS — Z823 Family history of stroke: Secondary | ICD-10-CM | POA: Diagnosis not present

## 2022-12-09 DIAGNOSIS — Z8673 Personal history of transient ischemic attack (TIA), and cerebral infarction without residual deficits: Secondary | ICD-10-CM | POA: Diagnosis not present

## 2022-12-09 DIAGNOSIS — F331 Major depressive disorder, recurrent, moderate: Secondary | ICD-10-CM | POA: Diagnosis not present

## 2022-12-09 DIAGNOSIS — R55 Syncope and collapse: Secondary | ICD-10-CM | POA: Diagnosis present

## 2022-12-09 DIAGNOSIS — Z8249 Family history of ischemic heart disease and other diseases of the circulatory system: Secondary | ICD-10-CM | POA: Diagnosis not present

## 2022-12-09 DIAGNOSIS — E559 Vitamin D deficiency, unspecified: Secondary | ICD-10-CM | POA: Diagnosis not present

## 2022-12-09 DIAGNOSIS — F02A11 Dementia in other diseases classified elsewhere, mild, with agitation: Secondary | ICD-10-CM | POA: Diagnosis not present

## 2022-12-09 DIAGNOSIS — J9819 Other pulmonary collapse: Secondary | ICD-10-CM | POA: Diagnosis not present

## 2022-12-09 DIAGNOSIS — Z7982 Long term (current) use of aspirin: Secondary | ICD-10-CM | POA: Diagnosis not present

## 2022-12-09 DIAGNOSIS — Z803 Family history of malignant neoplasm of breast: Secondary | ICD-10-CM | POA: Diagnosis not present

## 2022-12-09 DIAGNOSIS — G8911 Acute pain due to trauma: Secondary | ICD-10-CM | POA: Diagnosis not present

## 2022-12-09 DIAGNOSIS — Z66 Do not resuscitate: Secondary | ICD-10-CM | POA: Diagnosis present

## 2022-12-09 DIAGNOSIS — G309 Alzheimer's disease, unspecified: Secondary | ICD-10-CM | POA: Diagnosis not present

## 2022-12-09 DIAGNOSIS — Z87891 Personal history of nicotine dependence: Secondary | ICD-10-CM | POA: Diagnosis not present

## 2022-12-09 DIAGNOSIS — R059 Cough, unspecified: Secondary | ICD-10-CM | POA: Diagnosis not present

## 2022-12-09 DIAGNOSIS — E039 Hypothyroidism, unspecified: Secondary | ICD-10-CM | POA: Diagnosis present

## 2022-12-09 DIAGNOSIS — Z79890 Hormone replacement therapy (postmenopausal): Secondary | ICD-10-CM | POA: Diagnosis not present

## 2022-12-09 DIAGNOSIS — Z91119 Patient's noncompliance with dietary regimen due to unspecified reason: Secondary | ICD-10-CM | POA: Diagnosis not present

## 2022-12-09 DIAGNOSIS — Z833 Family history of diabetes mellitus: Secondary | ICD-10-CM | POA: Diagnosis not present

## 2022-12-09 DIAGNOSIS — E860 Dehydration: Secondary | ICD-10-CM | POA: Diagnosis present

## 2022-12-09 DIAGNOSIS — Z7401 Bed confinement status: Secondary | ICD-10-CM | POA: Diagnosis not present

## 2022-12-09 DIAGNOSIS — I5022 Chronic systolic (congestive) heart failure: Secondary | ICD-10-CM | POA: Diagnosis present

## 2022-12-09 DIAGNOSIS — E871 Hypo-osmolality and hyponatremia: Secondary | ICD-10-CM | POA: Diagnosis present

## 2022-12-09 DIAGNOSIS — R404 Transient alteration of awareness: Secondary | ICD-10-CM | POA: Diagnosis not present

## 2022-12-09 DIAGNOSIS — W1830XA Fall on same level, unspecified, initial encounter: Secondary | ICD-10-CM | POA: Diagnosis present

## 2022-12-09 DIAGNOSIS — I959 Hypotension, unspecified: Secondary | ICD-10-CM | POA: Diagnosis not present

## 2022-12-09 DIAGNOSIS — Y92091 Bathroom in other non-institutional residence as the place of occurrence of the external cause: Secondary | ICD-10-CM | POA: Diagnosis not present

## 2022-12-09 DIAGNOSIS — K219 Gastro-esophageal reflux disease without esophagitis: Secondary | ICD-10-CM | POA: Diagnosis present

## 2022-12-09 DIAGNOSIS — I130 Hypertensive heart and chronic kidney disease with heart failure and stage 1 through stage 4 chronic kidney disease, or unspecified chronic kidney disease: Secondary | ICD-10-CM | POA: Diagnosis present

## 2022-12-09 DIAGNOSIS — Z79899 Other long term (current) drug therapy: Secondary | ICD-10-CM | POA: Diagnosis not present

## 2022-12-09 DIAGNOSIS — N400 Enlarged prostate without lower urinary tract symptoms: Secondary | ICD-10-CM | POA: Diagnosis present

## 2022-12-09 DIAGNOSIS — F0390 Unspecified dementia without behavioral disturbance: Secondary | ICD-10-CM | POA: Diagnosis present

## 2022-12-09 DIAGNOSIS — I951 Orthostatic hypotension: Secondary | ICD-10-CM | POA: Diagnosis present

## 2022-12-09 DIAGNOSIS — N183 Chronic kidney disease, stage 3 unspecified: Secondary | ICD-10-CM | POA: Diagnosis not present

## 2022-12-09 DIAGNOSIS — J942 Hemothorax: Secondary | ICD-10-CM | POA: Diagnosis not present

## 2022-12-09 DIAGNOSIS — M1991 Primary osteoarthritis, unspecified site: Secondary | ICD-10-CM | POA: Diagnosis not present

## 2022-12-09 DIAGNOSIS — L605 Yellow nail syndrome: Secondary | ICD-10-CM | POA: Diagnosis not present

## 2022-12-09 DIAGNOSIS — S0101XA Laceration without foreign body of scalp, initial encounter: Secondary | ICD-10-CM | POA: Diagnosis present

## 2022-12-09 DIAGNOSIS — G909 Disorder of the autonomic nervous system, unspecified: Secondary | ICD-10-CM | POA: Diagnosis present

## 2022-12-09 DIAGNOSIS — R109 Unspecified abdominal pain: Secondary | ICD-10-CM | POA: Diagnosis not present

## 2022-12-09 DIAGNOSIS — E782 Mixed hyperlipidemia: Secondary | ICD-10-CM | POA: Diagnosis not present

## 2022-12-09 DIAGNOSIS — Z9889 Other specified postprocedural states: Secondary | ICD-10-CM | POA: Diagnosis not present

## 2022-12-09 DIAGNOSIS — M353 Polymyalgia rheumatica: Secondary | ICD-10-CM | POA: Diagnosis present

## 2022-12-09 DIAGNOSIS — M109 Gout, unspecified: Secondary | ICD-10-CM | POA: Diagnosis not present

## 2022-12-09 DIAGNOSIS — E876 Hypokalemia: Secondary | ICD-10-CM | POA: Diagnosis present

## 2022-12-09 DIAGNOSIS — B351 Tinea unguium: Secondary | ICD-10-CM | POA: Diagnosis not present

## 2022-12-09 LAB — BASIC METABOLIC PANEL
Anion gap: 8 (ref 5–15)
BUN: 34 mg/dL — ABNORMAL HIGH (ref 8–23)
CO2: 24 mmol/L (ref 22–32)
Calcium: 8.3 mg/dL — ABNORMAL LOW (ref 8.9–10.3)
Chloride: 103 mmol/L (ref 98–111)
Creatinine, Ser: 1.15 mg/dL (ref 0.61–1.24)
GFR, Estimated: >60 mL/min (ref >60)
Glucose, Bld: 106 mg/dL — ABNORMAL HIGH (ref 70–99)
Potassium: 3.3 mmol/L — ABNORMAL LOW (ref 3.5–5.1)
Sodium: 135 mmol/L (ref 135–145)

## 2022-12-09 LAB — ECHOCARDIOGRAM COMPLETE
AR max vel: 1.27 cm2
AV Area VTI: 1.39 cm2
AV Area mean vel: 1.21 cm2
AV Mean grad: 10.7 mmHg
AV Peak grad: 19.8 mmHg
Ao pk vel: 2.22 m/s
Area-P 1/2: 2.42 cm2
Height: 69 in
S' Lateral: 2.4 cm
Weight: 2077.62 oz

## 2022-12-09 LAB — GLUCOSE, CAPILLARY
Glucose-Capillary: 117 mg/dL — ABNORMAL HIGH (ref 70–99)
Glucose-Capillary: 101 mg/dL — ABNORMAL HIGH (ref 70–99)

## 2022-12-09 MED ORDER — POTASSIUM CHLORIDE IN NACL 40-0.9 MEQ/L-% IV SOLN
INTRAVENOUS | Status: AC
  Filled 2022-12-09 (×2): qty 1000

## 2022-12-09 NOTE — Progress Notes (Signed)
Unable to obtain history and physical this shift.  Pt alert to self only and no family present at bedside.

## 2022-12-09 NOTE — Progress Notes (Signed)
  Echocardiogram 2D Echocardiogram has been performed.  Delcie Roch 12/09/2022, 6:01 PM

## 2022-12-09 NOTE — TOC CAGE-AID Note (Signed)
Transition of Care [facility]) - CAGE-AID Screening  Patient Details  Name: Stuart Watson MRN: [id number] Date of Birth: Feb 18, 1932  Clinical Narrative:  Patient with known history of falls, previous admissions after he loses footing and falls straight backwards. Patient endorses 1 drink of alcohol daily, denies that it is a problem or plans to cut back. Patient denies any illicit drug use. Patient denies need for substance abuse resources at this time.  CAGE-AID Screening:    Have You Ever Felt You Ought to Cut Down on Your Drinking or Drug Use?: No Have People Annoyed You By Critizing Your Drinking Or Drug Use?: No Have You Felt Bad Or Guilty About Your Drinking Or Drug Use?: No Have You Ever Had a Drink or Used Drugs First Thing In The Morning to Steady Your Nerves or to Get Rid of a Hangover?: No CAGE-AID Score: 0  Substance Abuse Education Offered: Yes

## 2022-12-09 NOTE — Evaluation (Signed)
Physical Therapy Evaluation Patient Details Name: Stuart Watson MRN: [id number] DOB: 10-08-1931 Today's Date: 12/09/2022  History of Present Illness  Pt is a 89 year old male admitted on 12/08/22 for a syncopal episode at home. Past medical history significant of advanced dementia, temporal arthritis, bilateral benign pleural effusion s/p Pleurx, chronic HFpEF, CKD stage IIIb, HTN, BPH.  Clinical Impression  Pt presents with admitting diagnosis above. Pt son present for today session and able to provide history as pt has advanced dementia and expressive difficulties at baseline. Today pt required +2 Mod A for all mobility. Recommend SNF at pt ALF upon DC. PT will continue to follow.       Recommendations for follow up therapy are one component of a multi-disciplinary discharge planning process, led by the attending physician.  Recommendations may be updated based on patient status, additional functional criteria and insurance authorization.  Follow Up Recommendations Can patient physically be transported by private vehicle: No     Assistance Recommended at Discharge Frequent or constant Supervision/Assistance  Patient can return home with the following  Two people to help with walking and/or transfers;A lot of help with bathing/dressing/bathroom;Assistance with cooking/housework;Assistance with feeding;Direct supervision/assist for medications management;Direct supervision/assist for financial management;Assist for transportation;Help with stairs or ramp for entrance    Equipment Recommendations Other (comment) (Per accepting facility)  Recommendations for Other Services  OT consult;Speech consult    Functional Status Assessment Patient has had a recent decline in their functional status and demonstrates the ability to make significant improvements in function in a reasonable and predictable amount of time.     Precautions / Restrictions Precautions Precautions:  Fall Restrictions Weight Bearing Restrictions: No      Mobility  Bed Mobility Overal bed mobility: Needs Assistance Bed Mobility: Supine to Sit, Sit to Supine     Supine to sit: +2 for physical assistance, Mod assist Sit to supine: +2 for physical assistance, Mod assist        Transfers Overall transfer level: Needs assistance Equipment used: Rolling walker (2 wheels) Transfers: Sit to/from Stand, Bed to chair/wheelchair/BSC Sit to Stand: +2 physical assistance, Mod assist   Step pivot transfers: +2 physical assistance, Mod assist            Ambulation/Gait               General Gait Details: Deferred for safety  Stairs            Wheelchair Mobility    Modified Rankin (Stroke Patients Only)       Balance Overall balance assessment: Needs assistance Sitting-balance support: Bilateral upper extremity supported, Feet supported Sitting balance-Leahy Scale: Fair     Standing balance support: Bilateral upper extremity supported, During functional activity, Reliant on assistive device for balance Standing balance-Leahy Scale: Poor Standing balance comment: Reliant on RW                             Pertinent Vitals/Pain Pain Assessment Pain Assessment: No/denies pain    Home Living Family/patient expects to be discharged to:: Assisted living                 Home Equipment: Agricultural consultant (2 wheels);Other (comment) (Lift chair)      Prior Function Prior Level of Function : Needs assist;History of Falls (last six months) (1 fall at home that led to admission)             Mobility Comments:  Pt son reports that pt needed assistance with RW to walk to bathroom ADLs Comments: Staff at ALF assisted with ADLs     Hand Dominance   Dominant Hand: Right    Extremity/Trunk Assessment   Upper Extremity Assessment Upper Extremity Assessment: Generalized weakness    Lower Extremity Assessment Lower Extremity Assessment:  Generalized weakness    Cervical / Trunk Assessment Cervical / Trunk Assessment: Normal  Communication   Communication: Expressive difficulties;HOH  Cognition Arousal/Alertness: Lethargic Behavior During Therapy: Flat affect Overall Cognitive Status: History of cognitive impairments - at baseline                                 General Comments: Pt has advanced dementia at baseline. Pt also noted with slurred speech and expressive difficulties. Pt able to follow commands with increased time        General Comments General comments (skin integrity, edema, etc.): VSS on RA    Exercises     Assessment/Plan    PT Assessment Patient needs continued PT services  PT Problem List Decreased range of motion;Decreased strength;Decreased activity tolerance;Decreased balance;Decreased mobility;Decreased coordination;Decreased cognition;Decreased knowledge of use of DME;Decreased safety awareness;Cardiopulmonary status limiting activity       PT Treatment Interventions DME instruction;Gait training;Functional mobility training;Therapeutic activities;Therapeutic exercise;Balance training;Neuromuscular re-education;Cognitive remediation;Patient/family education    PT Goals (Current goals can be found in the Care Plan section)  Acute Rehab PT Goals Patient Stated Goal: to go home PT Goal Formulation: With patient/family Time For Goal Achievement: 12/23/22 Potential to Achieve Goals: Poor    Frequency Min 1X/week     Co-evaluation               AM-PAC PT "6 Clicks" Mobility  Outcome Measure Help needed turning from your back to your side while in a flat bed without using bedrails?: A Lot Help needed moving from lying on your back to sitting on the side of a flat bed without using bedrails?: A Lot Help needed moving to and from a bed to a chair (including a wheelchair)?: A Lot Help needed standing up from a chair using your arms (e.g., wheelchair or bedside chair)?:  A Lot Help needed to walk in hospital room?: Total Help needed climbing 3-5 steps with a railing? : Total 6 Click Score: 10    End of Session Equipment Utilized During Treatment: Gait belt Activity Tolerance: Patient tolerated treatment well Patient left: in bed;with call bell/phone within reach;with bed alarm set;with family/visitor present Nurse Communication: Mobility status PT Visit Diagnosis: Other abnormalities of gait and mobility (R26.89);Repeated falls (R29.6);Muscle weakness (generalized) (M62.81);Difficulty in walking, not elsewhere classified (R26.2)    Time: 1191-4782 PT Time Calculation (min) (ACUTE ONLY): 38 min   Charges:   PT Evaluation $PT Eval High Complexity: 1 High PT Treatments $Therapeutic Activity: 23-37 mins        Shela Nevin, PT, DPT Acute Rehab Services [phone]   Gladys Damme 12/09/2022, 5:31 PM

## 2022-12-09 NOTE — Progress Notes (Signed)
Attempted orthostatic BP this am.  Pt unable to pull to a standing position with 2 assist and walker.  Lying and sitting bp obtained.

## 2022-12-09 NOTE — Progress Notes (Signed)
PROGRESS NOTE    CROSS GOLDBECK  ZOX:[id number] DOB: 1932-05-29 DOA: 12/08/2022 PCP: Mahlon Gammon, MD   Brief Narrative: 89 year old male with a history of dementia, TIA, systolic heart failure, TIA, pleural effusion plurals drain removed 07/31/2022, GERD, CKD stage IIIb, hypertension, BPH optic neuritis, hyponatremia, unsteady gait, fibroma of prostate, admitted with syncopal episode which probably lasted for 5 minutes.  Patient was at the sink brushing his teeth with the help of an aide when he fell backwards and hit his back of his head with a hematoma on the back of his head.  He has been getting more unsteady on his feet.  History is limited due to dementia,  Obtained from his son and medical records. He is on torsemide 20 mg twice a day with Aldactone 12.5 mg daily and Zaroxolyn 2.5 mg once a week.  Patient saw the primary care physician on 12/07/2022 with concern for bradycardia.  Heart rate was reported as 42 at that time.  EKG done showed normal sinus rhythm.  His heart rate the lowest that has been recorded was 57. TSH was 2.36. In the ER EKG shows normal sinus rhythm heart rate in the 60s, blood pressure 108/48, afebrile, on room air. He Appeared weak and dry. Orthostatics show patient is orthostatic dropped his blood pressure to 92 from 113 from laying down to sitting up.  They were unable to stand him up as patient was extremely weak.  Assessment & Plan:   Principal Problem:   Syncope Active Problems:   CKD (chronic kidney disease) stage 3, GFR 30-59 ml/min (HCC)   Hypokalemia  #1 syncope likely from orthostatic hypotension.  Patient with history of advanced dementia unsteady gait to begin with.  He is on Demadex Aldactone prior to admission.  Will continue to hold these and start him on slow normal saline.  Patient is orthostatic. Echo ordered by the admitting physician is pending. Continue IV fluids cautiously with history of systolic heart failure.  #2 severe hypokalemia  being repleted. Checking mag level  #3 history of chronic systolic heart failure currently appears very dry hold diuresis slow IV fluids and monitor closely  #4 history of hypothyroidism normal TSH continue Synthroid  #5 advanced dementia on Aricept and Namenda.  Aricept dose was decreased 2 days ago.  #6 BPH on doxazosin.  #7 CKD stage IIIb-stable monitor  Estimated body mass index is 19.18 kg/m as calculated from the following:   Height as of this encounter: 5\' 9"  (1.753 m).   Weight as of this encounter: 58.9 kg.  DVT prophylaxis: Heparin  code Status: DNR  family Communication: Discussed with son  disposition Plan:  Status is: Observation The patient will require care spanning > 2 midnights and should be moved to inpatient because: Orthostatic syncope   Consultants:  None  Procedures: None Antimicrobials: None  Subjective: He is resting in bed very weak ill-appearing scalp hematoma covered with a dressing he is mumbling not able to follow conversation or follow commands  Objective: Vitals:   12/08/22 2034 12/09/22 0008 12/09/22 0532 12/09/22 0744  BP: (!) 106/90 (!) 109/52 (!) 109/50 (!) 108/48  Pulse: 81 75 67 70  Resp: 14 17 18 15   Temp: 98.1 F (36.7 C) 99.4 F (37.4 C) 98.8 F (37.1 C) 98.2 F (36.8 C)  TempSrc: Oral Axillary Axillary Oral  SpO2: 100% 96% 91% 96%  Weight:   58.9 kg   Height:        Intake/Output Summary (Last 24 hours)  at 12/09/2022 1011 Last data filed at 12/09/2022 0533 Gross per 24 hour  Intake 243 ml  Output 950 ml  Net -707 ml   Filed Weights   12/08/22 0915 12/09/22 0532  Weight: 60 kg 58.9 kg    Examination:  General exam: Appears in no acute distress but appears chronically ill and weak Respiratory system: Clear to auscultation. Respiratory effort normal. Cardiovascular system: S1 & S2 heard, RRR. No JVD, murmurs, rubs, gallops or clicks. No pedal edema. Gastrointestinal system: Abdomen is nondistended, soft and  nontender. No organomegaly or masses felt. Normal bowel sounds heard. Central nervous system: Alert and oriented. No focal neurological deficits. Extremities: No edema   Data Reviewed: I have personally reviewed following labs and imaging studies  CBC: Recent Labs  Lab 12/08/22 0920  WBC 10.6*  NEUTROABS 5.8  HGB 12.7*  HCT 38.0*  MCV 99.5  PLT 334   Basic Metabolic Panel: Recent Labs  Lab 12/08/22 0920 12/08/22 1135 12/08/22 1559 12/08/22 2145 12/09/22 0220  NA 134*  --   --   --  135  K 2.6*  --   --  3.7 3.3*  CL 96*  --   --   --  103  CO2 25  --   --   --  24  GLUCOSE 107*  --   --   --  106*  BUN 51*  --   --   --  34*  CREATININE 1.48*  --   --   --  1.15  CALCIUM 8.6*  --   --   --  8.3*  MG  --  2.4  --   --   --   PHOS  --   --  3.4  --   --    GFR: Estimated Creatinine Clearance: 37 mL/min (by C-G formula based on SCr of 1.15 mg/dL). Liver Function Tests: Recent Labs  Lab 12/08/22 0920  AST 23  ALT 13  ALKPHOS 53  BILITOT 0.5  PROT 6.5  ALBUMIN 3.3*   No results for input(s): "LIPASE", "AMYLASE" in the last 168 hours. No results for input(s): "AMMONIA" in the last 168 hours. Coagulation Profile: No results for input(s): "INR", "PROTIME" in the last 168 hours. Cardiac Enzymes: No results for input(s): "CKTOTAL", "CKMB", "CKMBINDEX", "TROPONINI" in the last 168 hours. BNP (last 3 results) No results for input(s): "PROBNP" in the last 8760 hours. HbA1C: No results for input(s): "HGBA1C" in the last 72 hours. CBG: Recent Labs  Lab 12/09/22 0745  GLUCAP 117*   Lipid Profile: No results for input(s): "CHOL", "HDL", "LDLCALC", "TRIG", "CHOLHDL", "LDLDIRECT" in the last 72 hours. Thyroid Function Tests: No results for input(s): "TSH", "T4TOTAL", "FREET4", "T3FREE", "THYROIDAB" in the last 72 hours. Anemia Panel: No results for input(s): "VITAMINB12", "FOLATE", "FERRITIN", "TIBC", "IRON", "RETICCTPCT" in the last 72 hours. Sepsis Labs: No  results for input(s): "PROCALCITON", "LATICACIDVEN" in the last 168 hours.  No results found for this or any previous visit (from the past 240 hour(s)).       Radiology Studies: DG Forearm Right  Result Date: 12/08/2022 CLINICAL DATA:  Fall with forearm pain EXAM: RIGHT FOREARM - 2 VIEW COMPARISON:  None Available. FINDINGS: No acute fracture or dislocation. No elbow joint effusion. No significant soft tissue swelling or radiopaque foreign object. IMPRESSION: No acute osseous abnormality. Electronically Signed   By: Jeronimo Greaves M.D.   On: 12/08/2022 11:39   DG Chest Port 1 View  Result Date: 12/08/2022 CLINICAL DATA:  89 year old male status post fall. EXAM: PORTABLE CHEST 1 VIEW COMPARISON:  Cervical spine CT 1004 hours today. Chest radiographs 07/24/2022. FINDINGS: Portable AP view at 1013 hours. Ongoing blunting of both lung bases suggesting chronic pleural effusions. Lung base ventilation appears mildly improved from January. No superimposed pneumothorax, pulmonary edema, air bronchograms. Normal cardiac size and mediastinal contours. Visualized tracheal air column is within normal limits. No acute osseous abnormality identified. Negative visible bowel gas. IMPRESSION: 1. Bilateral pleural effusions persist since January with mildly improved lung base ventilation since that time. 2. No acute traumatic injury identified. Electronically Signed   By: Odessa Fleming M.D.   On: 12/08/2022 10:21   CT CERVICAL SPINE WO CONTRAST  Result Date: 12/08/2022 CLINICAL DATA:  Polytrauma, blunt.  Fall. EXAM: CT CERVICAL SPINE WITHOUT CONTRAST TECHNIQUE: Multidetector CT imaging of the cervical spine was performed without intravenous contrast. Multiplanar CT image reconstructions were also generated. RADIATION DOSE REDUCTION: This exam was performed according to the departmental dose-optimization program which includes automated exposure control, adjustment of the mA and/or kV according to patient size and/or use of  iterative reconstruction technique. COMPARISON:  06/25/2022 FINDINGS: Alignment: No subluxation. Skull base and vertebrae: No acute fracture. No primary bone lesion or focal pathologic process. Soft tissues and spinal canal: No prevertebral fluid or swelling. No visible canal hematoma. Disc levels: Moderate to advanced diffuse degenerative disc disease with disc space narrowing and spurring, and bilateral degenerative facet disease. Upper chest: No acute findings. Other: None IMPRESSION: Diffuse cervical spondylosis.  No acute bony abnormality. Electronically Signed   By: Charlett Nose M.D.   On: 12/08/2022 10:12   CT HEAD WO CONTRAST  Result Date: 12/08/2022 CLINICAL DATA:  Head trauma, moderate-severe.  Fall. EXAM: CT HEAD WITHOUT CONTRAST TECHNIQUE: Contiguous axial images were obtained from the base of the skull through the vertex without intravenous contrast. RADIATION DOSE REDUCTION: This exam was performed according to the departmental dose-optimization program which includes automated exposure control, adjustment of the mA and/or kV according to patient size and/or use of iterative reconstruction technique. COMPARISON:  06/25/2022 FINDINGS: Brain: There is atrophy and chronic small vessel disease changes. No acute intracranial abnormality. Specifically, no hemorrhage, hydrocephalus, mass lesion, acute infarction, or significant intracranial injury. Vascular: No hyperdense vessel or unexpected calcification. Skull: No acute calvarial abnormality. Sinuses/Orbits: No acute findings. Mucosal thickening throughout the paranasal sinuses. Mastoid air cells clear. Other: Soft tissue swelling in the left posterior parietal scalp. IMPRESSION: Atrophy, chronic microvascular disease. No acute intracranial abnormality. Electronically Signed   By: Charlett Nose M.D.   On: 12/08/2022 10:10        Scheduled Meds:  allopurinol  150 mg Oral q AM   aspirin  81 mg Oral q AM   donepezil  5 mg Oral QHS   doxazosin  4  mg Oral Daily   escitalopram  10 mg Oral Daily   heparin  5,000 Units Subcutaneous Q12H   levothyroxine  25 mcg Oral QAC breakfast   memantine  10 mg Oral BID   pantoprazole  20 mg Oral QAC breakfast   potassium chloride SA  40 mEq Oral Daily   simvastatin  20 mg Oral QPM   sodium chloride flush  3 mL Intravenous Q12H   Continuous Infusions:   LOS: 0 days    Time spent: 39 min  Alwyn Ren, MD 12/09/2022, 10:11 AM

## 2022-12-10 ENCOUNTER — Other Ambulatory Visit: Payer: Self-pay

## 2022-12-10 DIAGNOSIS — R55 Syncope and collapse: Secondary | ICD-10-CM

## 2022-12-10 LAB — CBC
HCT: 35.8 % — ABNORMAL LOW (ref 39.0–52.0)
Hemoglobin: 11.5 g/dL — ABNORMAL LOW (ref 13.0–17.0)
MCH: 32.2 pg (ref 26.0–34.0)
MCHC: 32.1 g/dL (ref 30.0–36.0)
MCV: 100.3 fL — ABNORMAL HIGH (ref 80.0–100.0)
Platelets: 314 10*3/uL (ref 150–400)
RBC: 3.57 MIL/uL — ABNORMAL LOW (ref 4.22–5.81)
RDW: 14.3 % (ref 11.5–15.5)
WBC: 9.1 10*3/uL (ref 4.0–10.5)
nRBC: 0 % (ref 0.0–0.2)

## 2022-12-10 LAB — COMPREHENSIVE METABOLIC PANEL
ALT: 12 U/L (ref 0–44)
AST: 17 U/L (ref 15–41)
Albumin: 2.6 g/dL — ABNORMAL LOW (ref 3.5–5.0)
Alkaline Phosphatase: 45 U/L (ref 38–126)
Anion gap: 6 (ref 5–15)
BUN: 24 mg/dL — ABNORMAL HIGH (ref 8–23)
CO2: 24 mmol/L (ref 22–32)
Calcium: 8.1 mg/dL — ABNORMAL LOW (ref 8.9–10.3)
Chloride: 114 mmol/L — ABNORMAL HIGH (ref 98–111)
Creatinine, Ser: 0.99 mg/dL (ref 0.61–1.24)
GFR, Estimated: >60 mL/min (ref >60)
Glucose, Bld: 100 mg/dL — ABNORMAL HIGH (ref 70–99)
Potassium: 4.4 mmol/L (ref 3.5–5.1)
Sodium: 144 mmol/L (ref 135–145)
Total Bilirubin: 0.4 mg/dL (ref 0.3–1.2)
Total Protein: 5.6 g/dL — ABNORMAL LOW (ref 6.5–8.1)

## 2022-12-10 LAB — GLUCOSE, CAPILLARY: Glucose-Capillary: 98 mg/dL (ref 70–99)

## 2022-12-10 MED ORDER — ESCITALOPRAM OXALATE 10 MG PO TABS: 10.0000 mg | ORAL_TABLET | Freq: Every day | ORAL | 3 refills | Status: DC

## 2022-12-10 NOTE — TOC Initial Note (Addendum)
Transition of Care [facility]) - Initial/Assessment Note    Patient Details  Name: Stuart Watson MRN: [id number] Date of Birth: May 23, 1932  [facility] CM/SW Contact:    Delilah Shan, LCSWA Phone Number: 12/10/2022, 1:08 PM  Clinical Narrative:                  CSW received consult for possible SNF placement at time of discharge. Due to patients current orientation CSW spoke with patients son Karren Burly regarding PT recommendation of SNF placement at time of discharge. PTA patients son reports patient comes from [facility].Patients son  expressed understanding of PT recommendation and is agreeable to SNF placement for patient at [facility] at time of discharge. CSW discussed insurance authorization process .   No further questions reported at this time. CSW called and LVM with Katie in admissions with [facility]. CSW awaiting call back.CSW to continue to follow and assist with discharge planning needs.   Amber with [facility] called CSW back and confirmed SNF bed for patient.  Amber telephone # [phone].    Barriers to Discharge: Continued Medical Work up   Patient Goals and CMS Choice   CMS Medicare.gov Compare Post Acute Care list provided to:: Patient Represenative (must comment) (Patients son)        Expected Discharge Plan and Services In-house Referral: Clinical Social Work     Living arrangements for the past 2 months: Assisted Living Facility ([facility])                                      Prior Living Arrangements/Services Living arrangements for the past 2 months: Assisted Living Facility ([facility]) Lives with:: Facility Resident Patient language and need for interpreter reviewed:: Yes Do you feel safe going back to the place where you live?: No   SNF  Need for Family Participation in Patient Care: Yes (Comment) Care giver support system in place?: Yes (comment)   Criminal Activity/Legal  Involvement Pertinent to Current Situation/Hospitalization: No - Comment as needed  Activities of Daily Living      Permission Sought/Granted Permission sought to share information with : Case Manager, Family Supports, Oceanographer granted to share information with : No  Share Information with NAME: Due to patients current orientation CSW spoke with patients son Karren Burly  Permission granted to share info w AGENCY: Due to patients current orientation CSW spoke with patients son [facility] and [facility]F  Permission granted to share info w Relationship: Due to patients current orientation CSW spoke with patients son Karren Burly  Permission granted to share info w Contact Information: Due to patients current orientation CSW spoke with patients son Karren Burly [phone]  Emotional Assessment       Orientation: : Oriented to Self Alcohol / Substance Use: Not Applicable Psych Involvement: No (comment)  Admission diagnosis:  Syncope and collapse [R55] Syncope [R55] Pleural effusion, bilateral [J90] Fall, initial encounter [W19.XXXA] Laceration of scalp, initial encounter [S01.01XA] Patient Active Problem List   Diagnosis Date Noted   Fall 12/09/2022   Syncope 12/08/2022   Hypokalemia 12/08/2022   CKD (chronic kidney disease) stage 3, GFR 30-59 ml/min (HCC) 11/06/2022   Need for management of chest tube 07/02/2022   Pleural disease 04/09/2022   History of placement of chest tube 01/08/2022   Gout 11/07/2021   At risk for shortness of breath  08/21/2021   Visit for suture removal 04/06/2020   Postop check 02/03/2020   Encounter for post surgical wound check 12/24/2019   Hemothorax on left 12/09/2019   Trapped lung 11/26/2019   Loculated pleural effusion 11/24/2019   Chest tube in place 10/07/2019   Left inguinal hernia 01/27/2019   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   Abnormal chest xray 01/31/2018   Allergic rhinitis 01/21/2018   Arthritis, degenerative 01/21/2018    Benign fibroma of prostate 01/21/2018   Blood glucose elevated 01/21/2018   Restless leg 01/21/2018   Pure hypercholesterolemia 01/21/2018   Lung nodule, solitary 01/21/2018   Temporal arteritis (HCC) 01/21/2018   Temporary cerebral vascular dysfunction 01/21/2018   Thoracic, thoracolumbar and lumbosacral intervertebral disc disorder 01/21/2018   Bilateral pleural effusion 12/08/2017   Pedal edema- chronic 12/08/2017   Yellow nails 12/08/2017   Cough in adult 12/07/2017   Pleural effusion on left 12/06/2017   Hyponatremia 11/22/2017   Esophageal reflux 04/18/2016   Pneumonia 02/22/2016   Nail fungus 11/09/2015   Optic neuritis 11/08/2015   Chronic sinusitis 06/22/2015   Perirectal abscess 12/25/2013   Coronary artery calcification 08/18/2013   Shortness of breath 07/13/2013   History of TIA (transient ischemic attack) 07/13/2013   Edema 07/13/2013   Aortic atherosclerosis (HCC) 07/13/2013   PCP:  Mahlon Gammon, MD Pharmacy:   [facility]) - [place], [place] - [address] [facility] [address] [place] [address] [place] [address] Phone: [phone] Fax: [phone]  [id number] [place], [place] - [place] [address] [place] [address] Phone: [phone] Fax: [phone]  [facility] [address] [place] [address] Phone: [phone] Fax: [phone]  [facility] - [place], [place] - [address] [place] [address] Phone: [phone] Fax: [phone]     Social Determinants of Health (SDOH) Social History: SDOH Screenings   Food Insecurity: No Food Insecurity (03/19/2022)  Transportation Needs: No Transportation Needs (03/19/2022)  Alcohol Screen: Low Risk  (03/19/2022)  Depression (PHQ2-9): Low Risk  (08/08/2022)  Financial Resource Strain: Low Risk  (03/19/2022)  Physical Activity: Insufficiently Active  (03/19/2022)  Social Connections: Socially Isolated (03/19/2022)  Stress: No Stress Concern Present (03/19/2022)  Tobacco Use: Medium Risk (12/08/2022)   SDOH Interventions:     Readmission Risk Interventions     No data to display

## 2022-12-10 NOTE — [place] FL2 (Signed)
[place] MEDICAID FL2 LEVEL OF CARE FORM     IDENTIFICATION  Patient Name: Stuart Watson Birthdate: 1932/04/15 Sex: male Admission Date (Current Location): 12/08/2022  [facility] and [place] Number:  Producer, television/film/video and Address:  [facility] [place], [address], [place], [place] [address]      Provider Number: [id number]  Attending Physician Name and Address:  Alwyn Ren, MD  Relative Name and Phone Number:  Kendal Gaw [phone]    Current Level of Care: Hospital Recommended Level of Care: Skilled Nursing Facility Prior Approval Number:    Date Approved/Denied:   PASRR Number: [id number]  Discharge Plan: SNF    Current Diagnoses: Patient Active Problem List   Diagnosis Date Noted   Fall 12/09/2022   Syncope and collapse 12/08/2022   Hypokalemia 12/08/2022   CKD (chronic kidney disease) stage 3, GFR 30-59 ml/min (HCC) 11/06/2022   Need for management of chest tube 07/02/2022   Pleural disease 04/09/2022   History of placement of chest tube 01/08/2022   Gout 11/07/2021   At risk for shortness of breath 08/21/2021   Visit for suture removal 04/06/2020   Postop check 02/03/2020   Encounter for post surgical wound check 12/24/2019   Hemothorax on left 12/09/2019   Trapped lung 11/26/2019   Loculated pleural effusion 11/24/2019   Chest tube in place 10/07/2019   Left inguinal hernia 01/27/2019   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   Abnormal chest xray 01/31/2018   Allergic rhinitis 01/21/2018   Arthritis, degenerative 01/21/2018   Benign fibroma of prostate 01/21/2018   Blood glucose elevated 01/21/2018   Restless leg 01/21/2018   Pure hypercholesterolemia 01/21/2018   Lung nodule, solitary 01/21/2018   Temporal arteritis (HCC) 01/21/2018   Temporary cerebral vascular dysfunction 01/21/2018   Thoracic, thoracolumbar and lumbosacral intervertebral disc disorder 01/21/2018   Bilateral pleural effusion 12/08/2017   Pedal  edema- chronic 12/08/2017   Yellow nails 12/08/2017   Cough in adult 12/07/2017   Pleural effusion on left 12/06/2017   Hyponatremia 11/22/2017   Esophageal reflux 04/18/2016   Pneumonia 02/22/2016   Nail fungus 11/09/2015   Optic neuritis 11/08/2015   Chronic sinusitis 06/22/2015   Perirectal abscess 12/25/2013   Coronary artery calcification 08/18/2013   Shortness of breath 07/13/2013   History of TIA (transient ischemic attack) 07/13/2013   Edema 07/13/2013   Aortic atherosclerosis (HCC) 07/13/2013    Orientation RESPIRATION BLADDER Height & Weight     Self  Normal Incontinent, External catheter Weight: 125 lb 7.1 oz (56.9 kg) Height:  5\' 9"  (175.3 cm)  BEHAVIORAL SYMPTOMS/MOOD NEUROLOGICAL BOWEL NUTRITION STATUS      Incontinent Diet (see dc summary)  AMBULATORY STATUS COMMUNICATION OF NEEDS Skin   Extensive Assist Verbally Surgical wounds (5/18 Closed incision, head, left side)                       Personal Care Assistance Level of Assistance  Bathing, Feeding, Dressing Bathing Assistance: Limited assistance Feeding assistance: Limited assistance Dressing Assistance: Limited assistance     Functional Limitations Info  Sight, Hearing, Speech Sight Info: Impaired Hearing Info: Adequate Speech Info: Adequate    SPECIAL CARE FACTORS FREQUENCY  PT (By licensed PT), OT (By licensed OT)     PT Frequency: 5x week OT Frequency: 5x week            Contractures Contractures Info: Not present    Additional Factors Info  Code Status, Allergies Code  Status Info: DNR Allergies Info: NKA           Current Medications (12/10/2022):  This is the current hospital active medication list Current Facility-Administered Medications  Medication Dose Route Frequency Provider Last Rate Last Admin   acetaminophen (TYLENOL) tablet 1,000 mg  1,000 mg Oral Q6H PRN Mikey College T, MD       allopurinol (ZYLOPRIM) tablet 150 mg  150 mg Oral q AM Mikey College T, MD   150 mg  at 12/10/22 1610   aspirin chewable tablet 81 mg  81 mg Oral q AM Emeline General, MD   81 mg at 12/10/22 0859   donepezil (ARICEPT) tablet 5 mg  5 mg Oral QHS Mikey College T, MD   5 mg at 12/09/22 2001   doxazosin (CARDURA) tablet 4 mg  4 mg Oral Daily Mikey College T, MD   4 mg at 12/10/22 0858   escitalopram (LEXAPRO) tablet 10 mg  10 mg Oral Daily Mikey College T, MD   10 mg at 12/10/22 0859   heparin injection 5,000 Units  5,000 Units Subcutaneous Q12H Emeline General, MD   5,000 Units at 12/10/22 0859   levothyroxine (SYNTHROID) tablet 25 mcg  25 mcg Oral QAC breakfast Mikey College T, MD   25 mcg at 12/10/22 0550   memantine (NAMENDA) tablet 10 mg  10 mg Oral BID Mikey College T, MD   10 mg at 12/10/22 0859   pantoprazole (PROTONIX) EC tablet 20 mg  20 mg Oral QAC breakfast Mikey College T, MD   20 mg at 12/10/22 0859   potassium chloride SA (KLOR-CON M) CR tablet 40 mEq  40 mEq Oral Daily Mikey College T, MD   40 mEq at 12/10/22 0858   simvastatin (ZOCOR) tablet 20 mg  20 mg Oral QPM Mikey College T, MD   20 mg at 12/09/22 1750   sodium chloride flush (NS) 0.9 % injection 3 mL  3 mL Intravenous Q12H Mikey College T, MD   3 mL at 12/08/22 2226     Discharge Medications: Please see discharge summary for a list of discharge medications.  Relevant Imaging Results:  Relevant Lab Results:   Additional Information SSN [id number]  Carmina Miller, LCSWA

## 2022-12-10 NOTE — Progress Notes (Signed)
PROGRESS NOTE    Stuart Watson  ZOX:[id number] DOB: 23-Jul-1932 DOA: 12/08/2022 PCP: Mahlon Gammon, MD   Brief Narrative: 89 year old male with a history of dementia, TIA, systolic heart failure, TIA, pleural effusion plurals drain removed 07/31/2022, GERD, CKD stage IIIb, hypertension, BPH optic neuritis, hyponatremia, unsteady gait, fibroma of prostate, admitted with syncopal episode which probably lasted for 5 minutes.  Patient was at the sink brushing his teeth with the help of an aide when he fell backwards and hit his back of his head with a hematoma on the back of his head.  He has been getting more unsteady on his feet.  History is limited due to dementia,  Obtained from his son and medical records. He is on torsemide 20 mg twice a day with Aldactone 12.5 mg daily and Zaroxolyn 2.5 mg once a week.  Patient saw the primary care physician on 12/07/2022 with concern for bradycardia.  Heart rate was reported as 42 at that time.  EKG done showed normal sinus rhythm.  His heart rate the lowest that has been recorded was 57. TSH was 2.36. In the ER EKG shows normal sinus rhythm heart rate in the 60s, blood pressure 108/48, afebrile, on room air. He Appeared weak and dry. Orthostatics show patient is orthostatic dropped his blood pressure to 92 from 113 from laying down to sitting up.  They were unable to stand him up as patient was extremely weak.  Assessment & Plan:   Principal Problem:   Syncope Active Problems:   CKD (chronic kidney disease) stage 3, GFR 30-59 ml/min (HCC)   Hypokalemia   Fall  #1 syncope likely from orthostatic hypotension.  Patient with history of advanced dementia unsteady gait to begin with.  He is on Demadex Aldactone prior to admission.  Will continue to hold these and start him on slow normal saline.  Patient is orthostatic. Echo -ejection fraction 60 to 65%.  Grade 1 diastolic dysfunction.  Normal pulmonary artery systolic pressure.  Moderate calcification of the  aortic valve.  Mild aortic valve stenosis.    #2 severe hypokalemia resolved magnesium 2.4  #3 history of chronic systolic heart failure currently appears very dry hold diuresis slow IV fluids and monitor closely  #4 history of hypothyroidism normal TSH continue Synthroid  #5 advanced dementia on Aricept and Namenda.  Aricept dose was decreased 2 days ago.  #6 BPH on doxazosin.  #7 CKD stage IIIb-stable monitor  Estimated body mass index is 18.52 kg/m as calculated from the following:   Height as of this encounter: 5\' 9"  (1.753 m).   Weight as of this encounter: 56.9 kg.  DVT prophylaxis: Heparin  code Status: DNR  family Communication: Discussed with son  disposition Plan:  Status is: Observation The patient will require care spanning > 2 midnights and should be moved to inpatient because: Orthostatic syncope   Consultants:  None  Procedures: None Antimicrobials: None  Subjective: Very weak  Objective: Vitals:   12/09/22 2002 12/10/22 0531 12/10/22 0556 12/10/22 0756  BP: (!) 114/45 (!) 115/59 (!) 135/59 (!) 107/52  Pulse:  81  68  Resp: 14 15 14 15   Temp: 97.8 F (36.6 C) 98.4 F (36.9 C)  97.8 F (36.6 C)  TempSrc: Oral Oral  Oral  SpO2: 95% 94%  96%  Weight:  56.9 kg    Height:        Intake/Output Summary (Last 24 hours) at 12/10/2022 1404 Last data filed at 12/10/2022 1334 Gross per 24  hour  Intake 277.64 ml  Output 1800 ml  Net -1522.36 ml    Filed Weights   12/08/22 0915 12/09/22 0532 12/10/22 0531  Weight: 60 kg 58.9 kg 56.9 kg    Examination:  General exam: Appears in no acute distress but appears chronically ill and weak Respiratory system: Clear to auscultation. Respiratory effort normal. Cardiovascular system: S1 & S2 heard, RRR. No JVD, murmurs, rubs, gallops or clicks. No pedal edema. Gastrointestinal system: Abdomen is nondistended, soft and nontender. No organomegaly or masses felt. Normal bowel sounds heard. Central nervous system:  Alert and oriented. No focal neurological deficits. Extremities: No edema   Data Reviewed: I have personally reviewed following labs and imaging studies  CBC: Recent Labs  Lab 12/08/22 0920 12/10/22 0711  WBC 10.6* 9.1  NEUTROABS 5.8  --   HGB 12.7* 11.5*  HCT 38.0* 35.8*  MCV 99.5 100.3*  PLT 334 314    Basic Metabolic Panel: Recent Labs  Lab 12/08/22 0920 12/08/22 1135 12/08/22 1559 12/08/22 2145 12/09/22 0220 12/10/22 0711  NA 134*  --   --   --  135 144  K 2.6*  --   --  3.7 3.3* 4.4  CL 96*  --   --   --  103 114*  CO2 25  --   --   --  24 24  GLUCOSE 107*  --   --   --  106* 100*  BUN 51*  --   --   --  34* 24*  CREATININE 1.48*  --   --   --  1.15 0.99  CALCIUM 8.6*  --   --   --  8.3* 8.1*  MG  --  2.4  --   --   --   --   PHOS  --   --  3.4  --   --   --     GFR: Estimated Creatinine Clearance: 41.5 mL/min (by C-G formula based on SCr of 0.99 mg/dL). Liver Function Tests: Recent Labs  Lab 12/08/22 0920 12/10/22 0711  AST 23 17  ALT 13 12  ALKPHOS 53 45  BILITOT 0.5 0.4  PROT 6.5 5.6*  ALBUMIN 3.3* 2.6*    No results for input(s): "LIPASE", "AMYLASE" in the last 168 hours. No results for input(s): "AMMONIA" in the last 168 hours. Coagulation Profile: No results for input(s): "INR", "PROTIME" in the last 168 hours. Cardiac Enzymes: No results for input(s): "CKTOTAL", "CKMB", "CKMBINDEX", "TROPONINI" in the last 168 hours. BNP (last 3 results) No results for input(s): "PROBNP" in the last 8760 hours. HbA1C: No results for input(s): "HGBA1C" in the last 72 hours. CBG: Recent Labs  Lab 12/09/22 0745 12/09/22 2105 12/10/22 0538  GLUCAP 117* 101* 98    Lipid Profile: No results for input(s): "CHOL", "HDL", "LDLCALC", "TRIG", "CHOLHDL", "LDLDIRECT" in the last 72 hours. Thyroid Function Tests: No results for input(s): "TSH", "T4TOTAL", "FREET4", "T3FREE", "THYROIDAB" in the last 72 hours. Anemia Panel: No results for input(s):  "VITAMINB12", "FOLATE", "FERRITIN", "TIBC", "IRON", "RETICCTPCT" in the last 72 hours. Sepsis Labs: No results for input(s): "PROCALCITON", "LATICACIDVEN" in the last 168 hours.  No results found for this or any previous visit (from the past 240 hour(s)).       Radiology Studies: ECHOCARDIOGRAM COMPLETE  Result Date: 12/09/2022    ECHOCARDIOGRAM REPORT   Patient Name:   RANBIR YOHEY Date of Exam: 12/09/2022 Medical Rec #:  [id number]      Height:  69.0 in Accession #:    [id number]     Weight:       129.9 lb Date of Birth:  11-04-1931      BSA:          1.720 m Patient Age:    89 years       BP:           122/52 mmHg Patient Gender: M              HR:           66 bpm. Exam Location:  Inpatient Procedure: 2D Echo, Color Doppler and Cardiac Doppler Indications:    syncope  History:        Patient has prior history of Echocardiogram examinations, most                 recent 03/13/2021. Chronic kidney disease; Risk                 Factors:Dyslipidemia.  Sonographer:    Delcie Roch RDCS Referring Phys: [id number] Emeline General  Sonographer Comments: Image acquisition challenging due to uncooperative patient and dementia. IMPRESSIONS  1. Left ventricular ejection fraction, by estimation, is 60 to 65%. The left ventricle has normal function. The left ventricle has no regional wall motion abnormalities. Left ventricular diastolic parameters are consistent with Grade I diastolic dysfunction (impaired relaxation).  2. Right ventricular systolic function is normal. The right ventricular size is normal. There is normal pulmonary artery systolic pressure. The estimated right ventricular systolic pressure is 19.3 mmHg.  3. The mitral valve is grossly normal. Mild mitral valve regurgitation. No evidence of mitral stenosis.  4. The aortic valve is tricuspid. There is moderate calcification of the aortic valve. There is moderate thickening of the aortic valve. Aortic valve regurgitation is not visualized. Mild  aortic valve stenosis. Aortic valve mean gradient measures 10.7 mmHg. Aortic valve Vmax measures 2.22 m/s.  5. The inferior vena cava is normal in size with greater than 50% respiratory variability, suggesting right atrial pressure of 3 mmHg. Comparison(s): No significant change from prior study. FINDINGS  Left Ventricle: Left ventricular ejection fraction, by estimation, is 60 to 65%. The left ventricle has normal function. The left ventricle has no regional wall motion abnormalities. The left ventricular internal cavity size was normal in size. There is  no left ventricular hypertrophy. Left ventricular diastolic parameters are consistent with Grade I diastolic dysfunction (impaired relaxation). Right Ventricle: The right ventricular size is normal. No increase in right ventricular wall thickness. Right ventricular systolic function is normal. There is normal pulmonary artery systolic pressure. The tricuspid regurgitant velocity is 2.02 m/s, and  with an assumed right atrial pressure of 3 mmHg, the estimated right ventricular systolic pressure is 19.3 mmHg. Left Atrium: Left atrial size was normal in size. Right Atrium: Right atrial size was normal in size. Pericardium: Trivial pericardial effusion is present. Mitral Valve: The mitral valve is grossly normal. Mild mitral valve regurgitation. No evidence of mitral valve stenosis. Tricuspid Valve: The tricuspid valve is grossly normal. Tricuspid valve regurgitation is trivial. No evidence of tricuspid stenosis. Aortic Valve: The aortic valve is tricuspid. There is moderate calcification of the aortic valve. There is moderate thickening of the aortic valve. Aortic valve regurgitation is not visualized. Mild aortic stenosis is present. Aortic valve mean gradient measures 10.7 mmHg. Aortic valve peak gradient measures 19.8 mmHg. Aortic valve area, by VTI measures 1.39 cm. Pulmonic Valve: The pulmonic valve was grossly normal.  Pulmonic valve regurgitation is mild. No  evidence of pulmonic stenosis. Aorta: The aortic root and ascending aorta are structurally normal, with no evidence of dilitation. Venous: The inferior vena cava is normal in size with greater than 50% respiratory variability, suggesting right atrial pressure of 3 mmHg. IAS/Shunts: The atrial septum is grossly normal.  LEFT VENTRICLE PLAX 2D LVIDd:         3.80 cm   Diastology LVIDs:         2.40 cm   LV e' medial:    8.70 cm/s LV PW:         0.90 cm   LV E/e' medial:  10.9 LV IVS:        0.90 cm   LV e' lateral:   7.83 cm/s LVOT diam:     2.10 cm   LV E/e' lateral: 12.1 LV SV:         64 LV SV Index:   37 LVOT Area:     3.46 cm  RIGHT VENTRICLE             IVC RV S prime:     15.10 cm/s  IVC diam: 1.30 cm TAPSE (M-mode): 2.3 cm LEFT ATRIUM             Index        RIGHT ATRIUM           Index LA diam:        3.00 cm 1.74 cm/m   RA Area:     15.30 cm LA Vol (A2C):   36.4 ml 21.17 ml/m  RA Volume:   39.60 ml  23.03 ml/m LA Vol (A4C):   51.5 ml 29.95 ml/m LA Biplane Vol: 47.7 ml 27.74 ml/m  AORTIC VALVE AV Area (Vmax):    1.27 cm AV Area (Vmean):   1.21 cm AV Area (VTI):     1.39 cm AV Vmax:           222.33 cm/s AV Vmean:          149.000 cm/s AV VTI:            0.460 m AV Peak Grad:      19.8 mmHg AV Mean Grad:      10.7 mmHg LVOT Vmax:         81.30 cm/s LVOT Vmean:        52.100 cm/s LVOT VTI:          0.184 m LVOT/AV VTI ratio: 0.40  AORTA Ao Root diam: 3.00 cm Ao Asc diam:  2.60 cm MITRAL VALVE               TRICUSPID VALVE MV Area (PHT): 2.42 cm    TR Peak grad:   16.3 mmHg MV Decel Time: 313 msec    TR Vmax:        202.00 cm/s MV E velocity: 94.70 cm/s MV A velocity: 90.10 cm/s  SHUNTS MV E/A ratio:  1.05        Systemic VTI:  0.18 m                            Systemic Diam: 2.10 cm Lennie Odor MD Electronically signed by Lennie Odor MD Signature Date/Time: 12/09/2022/6:09:25 PM    Final         Scheduled Meds:  allopurinol  150 mg Oral q AM   aspirin  81 mg Oral q AM  donepezil  5 mg  Oral QHS   doxazosin  4 mg Oral Daily   escitalopram  10 mg Oral Daily   heparin  5,000 Units Subcutaneous Q12H   levothyroxine  25 mcg Oral QAC breakfast   memantine  10 mg Oral BID   pantoprazole  20 mg Oral QAC breakfast   potassium chloride SA  40 mEq Oral Daily   simvastatin  20 mg Oral QPM   sodium chloride flush  3 mL Intravenous Q12H   Continuous Infusions:   LOS: 1 day    Time spent: 39 min  Alwyn Ren, MD 12/10/2022, 2:04 PM

## 2022-12-11 ENCOUNTER — Inpatient Hospital Stay (HOSPITAL_COMMUNITY): Payer: Medicare Other

## 2022-12-11 DIAGNOSIS — R55 Syncope and collapse: Secondary | ICD-10-CM | POA: Diagnosis not present

## 2022-12-11 LAB — GLUCOSE, CAPILLARY: Glucose-Capillary: 115 mg/dL — ABNORMAL HIGH (ref 70–99)

## 2022-12-11 MED ORDER — ORAL CARE MOUTH RINSE
15.0000 mL | OROMUCOSAL | Status: DC
  Administered 2022-12-11 – 2022-12-13 (×8): 15 mL via OROMUCOSAL

## 2022-12-11 MED ORDER — ORAL CARE MOUTH RINSE: 15.0000 mL | OROMUCOSAL | Status: DC | PRN

## 2022-12-11 NOTE — TOC Progression Note (Signed)
Transition of Care [facility]) - Progression Note    Patient Details  Name: Stuart Watson MRN: [id number] Date of Birth: Jan 01, 1932  [facility] of [facility] CM/SW Contact  Delilah Shan, LCSWA Phone Number: 12/11/2022, 4:01 PM  Clinical Narrative:     Patient has SNF bed at [facility] when medically ready. CSW will continue to follow and assist with patients dc planning needs.     Barriers to Discharge: Continued Medical Work up  Expected Discharge Plan and Services In-house Referral: Clinical Social Work     Living arrangements for the past 2 months: Assisted Living Facility ([facility])                                       Social Determinants of Health (SDOH) Interventions SDOH Screenings   Food Insecurity: No Food Insecurity (03/19/2022)  Transportation Needs: No Transportation Needs (03/19/2022)  Alcohol Screen: Low Risk  (03/19/2022)  Depression (PHQ2-9): Low Risk  (08/08/2022)  Financial Resource Strain: Low Risk  (03/19/2022)  Physical Activity: Insufficiently Active (03/19/2022)  Social Connections: Socially Isolated (03/19/2022)  Stress: No Stress Concern Present (03/19/2022)  Tobacco Use: Medium Risk (12/08/2022)    Readmission Risk Interventions     No data to display

## 2022-12-11 NOTE — Progress Notes (Addendum)
PROGRESS NOTE    Stuart Watson  GNF:[id number] DOB: Dec 04, 1931 DOA: 12/08/2022 PCP: Mahlon Gammon, MD   Brief Narrative: 89 year old male with a history of dementia, TIA, systolic heart failure, TIA, pleural effusion plurals drain removed 07/31/2022, GERD, CKD stage IIIb, hypertension, BPH optic neuritis, hyponatremia, unsteady gait, fibroma of prostate, admitted with syncopal episode which probably lasted for 5 minutes.  Patient was at the sink brushing his teeth with the help of an aide when he fell backwards and hit his back of his head with a hematoma on the back of his head.  He has been getting more unsteady on his feet.  History is limited due to dementia,  Obtained from his son and medical records. He is on torsemide 20 mg twice a day with Aldactone 12.5 mg daily and Zaroxolyn 2.5 mg once a week.  Patient saw the primary care physician on 12/07/2022 with concern for bradycardia.  Heart rate was reported as 42 at that time.  EKG done showed normal sinus rhythm.  His heart rate the lowest that has been recorded was 57. TSH was 2.36. In the ER EKG shows normal sinus rhythm heart rate in the 60s, blood pressure 108/48, afebrile, on room air. He Appeared weak and dry. Orthostatics show patient is orthostatic dropped his blood pressure to 92 from 113 from laying down to sitting up.  They were unable to stand him up as patient was extremely weak.  Assessment & Plan:   Principal Problem:   Syncope and collapse Active Problems:   CKD (chronic kidney disease) stage 3, GFR 30-59 ml/min (HCC)   Hypokalemia   Fall  #1 syncope likely from orthostatic hypotension.  Patient with history of advanced dementia unsteady gait to begin with.  He is on Demadex Aldactone prior to admission.  Will continue to hold these, he received IV fluids.  Patient was orthostatic He was also started on low-dose midodrine. Echo -ejection fraction 60 to 65%.  Grade 1 diastolic dysfunction.  Normal pulmonary artery  systolic pressure.  Moderate calcification of the aortic valve.  Mild aortic valve stenosis.    #2 severe hypokalemia resolved magnesium 2.4  #3 history of chronic systolic heart failure currently appears very dry hold diuresis slow IV fluids and monitor closely  #4 history of hypothyroidism normal TSH continue Synthroid  #5 advanced dementia on Aricept and Namenda.  Aricept dose was decreased 2 days ago.  #6 BPH on doxazosin.  #7 CKD stage IIIb-stable monitor  Estimated body mass index is 18.82 kg/m as calculated from the following:   Height as of this encounter: 5\' 9"  (1.753 m).   Weight as of this encounter: 57.8 kg.  DVT prophylaxis: Heparin  code Status: DNR  family Communication: Discussed with son 12/09/2022 disposition Plan:  Status is: Inpatient  the patient will require care spanning > 2 midnights and should be moved to inpatient because: Orthostatic syncope   Consultants:  None  Procedures: None Antimicrobials: None  Subjective:  Resting in bed in no acute distress very hard of hearing and cannot understand what he is trying to say which is his baseline  Objective: Vitals:   12/11/22 0024 12/11/22 0313 12/11/22 0831 12/11/22 1145  BP: 106/68 (!) 107/57 (!) 115/54 (!) 125/53  Pulse: 67 69 66 68  Resp: 18 20 12 19   Temp: 98.6 F (37 C) 98.6 F (37 C) 98.8 F (37.1 C) 98.8 F (37.1 C)  TempSrc: Axillary Axillary Oral Oral  SpO2: 95% 96% 94% 97%  Weight:  57.8 kg    Height:        Intake/Output Summary (Last 24 hours) at 12/11/2022 1237 Last data filed at 12/11/2022 1610 Gross per 24 hour  Intake --  Output 1101 ml  Net -1101 ml    Filed Weights   12/09/22 0532 12/10/22 0531 12/11/22 0313  Weight: 58.9 kg 56.9 kg 57.8 kg    Examination:  General exam: Appears in no acute distress but appears chronically ill and weak Respiratory system: Clear to auscultation. Respiratory effort normal. Cardiovascular system: S1 & S2 heard, RRR. No JVD, murmurs,  rubs, gallops or clicks. No pedal edema. Gastrointestinal system: Abdomen is distended, soft and nontender. No organomegaly or masses felt. Normal bowel sounds heard. Central nervous system: Awake.  Tries to mumble something and talk which is his baseline does not really follow commands hard of hearing Extremities: No edema   Data Reviewed: I have personally reviewed following labs and imaging studies  CBC: Recent Labs  Lab 12/08/22 0920 12/10/22 0711  WBC 10.6* 9.1  NEUTROABS 5.8  --   HGB 12.7* 11.5*  HCT 38.0* 35.8*  MCV 99.5 100.3*  PLT 334 314    Basic Metabolic Panel: Recent Labs  Lab 12/08/22 0920 12/08/22 1135 12/08/22 1559 12/08/22 2145 12/09/22 0220 12/10/22 0711  NA 134*  --   --   --  135 144  K 2.6*  --   --  3.7 3.3* 4.4  CL 96*  --   --   --  103 114*  CO2 25  --   --   --  24 24  GLUCOSE 107*  --   --   --  106* 100*  BUN 51*  --   --   --  34* 24*  CREATININE 1.48*  --   --   --  1.15 0.99  CALCIUM 8.6*  --   --   --  8.3* 8.1*  MG  --  2.4  --   --   --   --   PHOS  --   --  3.4  --   --   --     GFR: Estimated Creatinine Clearance: 42.2 mL/min (by C-G formula based on SCr of 0.99 mg/dL). Liver Function Tests: Recent Labs  Lab 12/08/22 0920 12/10/22 0711  AST 23 17  ALT 13 12  ALKPHOS 53 45  BILITOT 0.5 0.4  PROT 6.5 5.6*  ALBUMIN 3.3* 2.6*    No results for input(s): "LIPASE", "AMYLASE" in the last 168 hours. No results for input(s): "AMMONIA" in the last 168 hours. Coagulation Profile: No results for input(s): "INR", "PROTIME" in the last 168 hours. Cardiac Enzymes: No results for input(s): "CKTOTAL", "CKMB", "CKMBINDEX", "TROPONINI" in the last 168 hours. BNP (last 3 results) No results for input(s): "PROBNP" in the last 8760 hours. HbA1C: No results for input(s): "HGBA1C" in the last 72 hours. CBG: Recent Labs  Lab 12/09/22 0745 12/09/22 2105 12/10/22 0538 12/11/22 0536  GLUCAP 117* 101* 98 115*    Lipid Profile: No  results for input(s): "CHOL", "HDL", "LDLCALC", "TRIG", "CHOLHDL", "LDLDIRECT" in the last 72 hours. Thyroid Function Tests: No results for input(s): "TSH", "T4TOTAL", "FREET4", "T3FREE", "THYROIDAB" in the last 72 hours. Anemia Panel: No results for input(s): "VITAMINB12", "FOLATE", "FERRITIN", "TIBC", "IRON", "RETICCTPCT" in the last 72 hours. Sepsis Labs: No results for input(s): "PROCALCITON", "LATICACIDVEN" in the last 168 hours.  No results found for this or any previous visit (from the past 240  hour(s)).       Radiology Studies: ECHOCARDIOGRAM COMPLETE  Result Date: 12/09/2022    ECHOCARDIOGRAM REPORT   Patient Name:   Stuart Watson Date of Exam: 12/09/2022 Medical Rec #:  [id number]      Height:       69.0 in Accession #:    [id number]     Weight:       129.9 lb Date of Birth:  1932/04/18      BSA:          1.720 m Patient Age:    89 years       BP:           122/52 mmHg Patient Gender: M              HR:           66 bpm. Exam Location:  Inpatient Procedure: 2D Echo, Color Doppler and Cardiac Doppler Indications:    syncope  History:        Patient has prior history of Echocardiogram examinations, most                 recent 03/13/2021. Chronic kidney disease; Risk                 Factors:Dyslipidemia.  Sonographer:    Delcie Roch RDCS Referring Phys: [id number] [facility]  Sonographer Comments: Image acquisition challenging due to uncooperative patient and dementia. IMPRESSIONS  1. Left ventricular ejection fraction, by estimation, is 60 to 65%. The left ventricle has normal function. The left ventricle has no regional wall motion abnormalities. Left ventricular diastolic parameters are consistent with Grade I diastolic dysfunction (impaired relaxation).  2. Right ventricular systolic function is normal. The right ventricular size is normal. There is normal pulmonary artery systolic pressure. The estimated right ventricular systolic pressure is 19.3 mmHg.  3. The mitral valve is grossly  normal. Mild mitral valve regurgitation. No evidence of mitral stenosis.  4. The aortic valve is tricuspid. There is moderate calcification of the aortic valve. There is moderate thickening of the aortic valve. Aortic valve regurgitation is not visualized. Mild aortic valve stenosis. Aortic valve mean gradient measures 10.7 mmHg. Aortic valve Vmax measures 2.22 m/s.  5. The inferior vena cava is normal in size with greater than 50% respiratory variability, suggesting right atrial pressure of 3 mmHg. Comparison(s): No significant change from prior study. FINDINGS  Left Ventricle: Left ventricular ejection fraction, by estimation, is 60 to 65%. The left ventricle has normal function. The left ventricle has no regional wall motion abnormalities. The left ventricular internal cavity size was normal in size. There is  no left ventricular hypertrophy. Left ventricular diastolic parameters are consistent with Grade I diastolic dysfunction (impaired relaxation). Right Ventricle: The right ventricular size is normal. No increase in right ventricular wall thickness. Right ventricular systolic function is normal. There is normal pulmonary artery systolic pressure. The tricuspid regurgitant velocity is 2.02 m/s, and  with an assumed right atrial pressure of 3 mmHg, the estimated right ventricular systolic pressure is 19.3 mmHg. Left Atrium: Left atrial size was normal in size. Right Atrium: Right atrial size was normal in size. Pericardium: Trivial pericardial effusion is present. Mitral Valve: The mitral valve is grossly normal. Mild mitral valve regurgitation. No evidence of mitral valve stenosis. Tricuspid Valve: The tricuspid valve is grossly normal. Tricuspid valve regurgitation is trivial. No evidence of tricuspid stenosis. Aortic Valve: The aortic valve is tricuspid. There is moderate calcification of the aortic  valve. There is moderate thickening of the aortic valve. Aortic valve regurgitation is not visualized. Mild  aortic stenosis is present. Aortic valve mean gradient measures 10.7 mmHg. Aortic valve peak gradient measures 19.8 mmHg. Aortic valve area, by VTI measures 1.39 cm. Pulmonic Valve: The pulmonic valve was grossly normal. Pulmonic valve regurgitation is mild. No evidence of pulmonic stenosis. Aorta: The aortic root and ascending aorta are structurally normal, with no evidence of dilitation. Venous: The inferior vena cava is normal in size with greater than 50% respiratory variability, suggesting right atrial pressure of 3 mmHg. IAS/Shunts: The atrial septum is grossly normal.  LEFT VENTRICLE PLAX 2D LVIDd:         3.80 cm   Diastology LVIDs:         2.40 cm   LV e' medial:    8.70 cm/s LV PW:         0.90 cm   LV E/e' medial:  10.9 LV IVS:        0.90 cm   LV e' lateral:   7.83 cm/s LVOT diam:     2.10 cm   LV E/e' lateral: 12.1 LV SV:         64 LV SV Index:   37 LVOT Area:     3.46 cm  RIGHT VENTRICLE             IVC RV S prime:     15.10 cm/s  IVC diam: 1.30 cm TAPSE (M-mode): 2.3 cm LEFT ATRIUM             Index        RIGHT ATRIUM           Index LA diam:        3.00 cm 1.74 cm/m   RA Area:     15.30 cm LA Vol (A2C):   36.4 ml 21.17 ml/m  RA Volume:   39.60 ml  23.03 ml/m LA Vol (A4C):   51.5 ml 29.95 ml/m LA Biplane Vol: 47.7 ml 27.74 ml/m  AORTIC VALVE AV Area (Vmax):    1.27 cm AV Area (Vmean):   1.21 cm AV Area (VTI):     1.39 cm AV Vmax:           222.33 cm/s AV Vmean:          149.000 cm/s AV VTI:            0.460 m AV Peak Grad:      19.8 mmHg AV Mean Grad:      10.7 mmHg LVOT Vmax:         81.30 cm/s LVOT Vmean:        52.100 cm/s LVOT VTI:          0.184 m LVOT/AV VTI ratio: 0.40  AORTA Ao Root diam: 3.00 cm Ao Asc diam:  2.60 cm MITRAL VALVE               TRICUSPID VALVE MV Area (PHT): 2.42 cm    TR Peak grad:   16.3 mmHg MV Decel Time: 313 msec    TR Vmax:        202.00 cm/s MV E velocity: 94.70 cm/s MV A velocity: 90.10 cm/s  SHUNTS MV E/A ratio:  1.05        Systemic VTI:  0.18 m  Systemic Diam: 2.10 cm Lennie Odor MD Electronically signed by Lennie Odor MD Signature Date/Time: 12/09/2022/6:09:25 PM    Final         Scheduled Meds:  allopurinol  150 mg Oral q AM   aspirin  81 mg Oral q AM   donepezil  5 mg Oral QHS   doxazosin  4 mg Oral Daily   escitalopram  10 mg Oral Daily   heparin  5,000 Units Subcutaneous Q12H   levothyroxine  25 mcg Oral QAC breakfast   memantine  10 mg Oral BID   mouth rinse  15 mL Mouth Rinse 4 times per day   pantoprazole  20 mg Oral QAC breakfast   potassium chloride SA  40 mEq Oral Daily   simvastatin  20 mg Oral QPM   sodium chloride flush  3 mL Intravenous Q12H   Continuous Infusions:   LOS: 2 days    Time spent: 39 min  Alwyn Ren, MD 12/11/2022, 12:37 PM

## 2022-12-12 DIAGNOSIS — R55 Syncope and collapse: Secondary | ICD-10-CM | POA: Diagnosis not present

## 2022-12-12 LAB — GLUCOSE, CAPILLARY: Glucose-Capillary: 99 mg/dL (ref 70–99)

## 2022-12-12 MED ORDER — TORSEMIDE 20 MG PO TABS: 20.0000 mg | ORAL_TABLET | Freq: Every day | ORAL | Status: DC

## 2022-12-12 NOTE — Discharge Summary (Signed)
Physician Discharge Summary  [name]:[id number] DOB: 1932/06/28 DOA: 12/08/2022  PCP: Mahlon Gammon, MD  Admit date: 12/08/2022 Discharge date: 12/12/2022  Admitted From: Assisted living facility Disposition: Skilled nursing facility  Recommendations for Outpatient Follow-up:  Follow up with PCP in 1-2 weeks Please obtain BMP/CBC in one week   Discharge Condition: Fair CODE STATUS: DNR Diet recommendation: Low-salt diet  Discharge summary: Patient is a 89 year old gentleman with history of dementia, TIA, systolic heart failure, GERD, CKD stage IIIb from assisted living facility presented with syncopal episode.  Patient was at the sink brushing his teeth with the help of an aide when he fell back and hit his head with a hematoma on the back of his head.  Recently has been getting more unsteady on his feet.  Patient also on torsemide and Aldactone as well as Zaroxolyn.  Recently seen by primary care physician on 5/17 with concern for bradycardia with heart rate of 42.  In the emergency room hemodynamically stable.  Blood pressure 108/48.  Afebrile.  On room air.  Was looking weak and dry.  Had significant orthostatic drop in blood pressure and unable to stand up so admitted to the hospital.     Assessment & Plan of care:   Syncope and collapse secondary to orthostatic hypotension: Multifactorial.  Autonomic dysfunction, multiple antihypertensive medications and diuretics. Resuscitated with gentle IV fluids.  Supine blood pressures are adequate and now tolerating standing up.  No orthostatic drop in blood pressure today.  Off IV fluids.  Patient on Demadex 60 mg twice daily and Aldactone at home.  He is euvolemic and high risk of dehydration.  Will go back on Demadex 20 mg daily along with potassium supplementation.  Will discontinue Aldactone.  Echocardiogram with ejection fraction 60 to 65%, grade 1 diastolic dysfunction.  Clinically euvolemic. Continue to work with PT OT and  refer to SNF level of care.  Follow with risk of hematoma: Patient has repaired posterior scalp wound with staples.  Remove his staples in 1 week 5/27.   Electrolytes, severe hypokalemia: Adequately replaced.  Magnesium is adequate.  Continue potassium supplementation.   Chronic systolic heart failure: Presented with clinical dehydration.  Treated with IV fluids.  Currently euvolemic and on room air.  Will resume low-dose of diuretics.   Hypothyroidism, on Synthroid Advanced dementia, on Aricept and Namenda BPH, on doxazosin CKD 3B, fairly stable.   Patient from ALF.  PT recommended skilled nursing facility.  Stable to transfer to SNF. Discharge Diagnoses:  Principal Problem:   Syncope and collapse Active Problems:   CKD (chronic kidney disease) stage 3, GFR 30-59 ml/min (HCC)   Hypokalemia   Fall    Discharge Instructions  Discharge Instructions     Diet - low sodium heart healthy   Complete by: As directed    Discharge instructions   Complete by: As directed    Remove scalp staple on 5/27   Discharge wound care:   Complete by: As directed    Dry dressing   Increase activity slowly   Complete by: As directed       Allergies as of 12/12/2022   No Known Allergies      Medication List     STOP taking these medications    metolazone 2.5 MG tablet Commonly known as: ZAROXOLYN   spironolactone 25 MG tablet Commonly known as: ALDACTONE       TAKE these medications    acetaminophen 500 MG tablet Commonly known as: TYLENOL Take  2 tablets (1,000 mg total) by mouth in the morning and at bedtime.   allopurinol 300 MG tablet Commonly known as: ZYLOPRIM Take 150 mg by mouth in the morning. For gout   aspirin 81 MG chewable tablet Chew 81 mg by mouth in the morning. Chew and swallow tablet   donepezil 5 MG disintegrating tablet Commonly known as: ARICEPT ODT Take 1 tablet (5 mg total) by mouth at bedtime. For co++ + What changed: additional instructions    doxazosin 4 MG tablet Commonly known as: CARDURA Take 4 mg by mouth daily.   escitalopram 10 MG tablet Commonly known as: LEXAPRO Take 1 tablet (10 mg total) by mouth daily.   levothyroxine 25 MCG tablet Commonly known as: SYNTHROID Take 25 mcg by mouth daily before breakfast.   memantine 10 MG tablet Commonly known as: NAMENDA Take 1 tablet (10 mg total) by mouth 2 (two) times daily.   pantoprazole 20 MG tablet Commonly known as: PROTONIX Take 20 mg by mouth daily before breakfast.   potassium chloride SA 20 MEQ tablet Commonly known as: KLOR-CON M Take 40 mEq by mouth daily.   simvastatin 20 MG tablet Commonly known as: ZOCOR Take 20 mg by mouth every evening.   torsemide 20 MG tablet Commonly known as: DEMADEX Take 1 tablet (20 mg total) by mouth daily. Give 3 tablet by mouth What changed:  how much to take when to take this               Discharge Care Instructions  (From admission, onward)           Start     Ordered   12/12/22 0000  Discharge wound care:       Comments: Dry dressing   12/12/22 1453            No Known Allergies  Consultations: None   Procedures/Studies: DG Abd 1 View  Result Date: 12/11/2022 CLINICAL DATA:  Abdominal pain for a few days EXAM: ABDOMEN - 1 VIEW COMPARISON:  X-ray 12/11/2022 FINDINGS: Scattered colonic stool. Overall moderate stool burden. Gas is seen in nondilated loops of small and large bowel. There is air along the rectum. Stomach is mildly distended. No obvious free air on these supine radiographs. Note is made of bilateral pleural effusions and adjacent lung opacities. Please correlate with the x-ray of the chest from 12/08/2022. Scattered vascular calcifications. Osteopenia and degenerative changes. IMPRESSION: Moderate colonic stool.  Nonspecific bowel gas pattern Electronically Signed   By: Karen Kays M.D.   On: 12/11/2022 16:19   ECHOCARDIOGRAM COMPLETE  Result Date: 12/09/2022     ECHOCARDIOGRAM REPORT   Patient Name:   Stuart Watson Date of Exam: 12/09/2022 Medical Rec #:  [id number]      Height:       69.0 in Accession #:    [id number]     Weight:       129.9 lb Date of Birth:  May 26, 1932      BSA:          1.720 m Patient Age:    89 years       BP:           122/52 mmHg Patient Gender: M              HR:           66 bpm. Exam Location:  Inpatient Procedure: 2D Echo, Color Doppler and Cardiac Doppler Indications:    syncope  History:  Patient has prior history of Echocardiogram examinations, most                 recent 03/13/2021. Chronic kidney disease; Risk                 Factors:Dyslipidemia.  Sonographer:    Delcie Roch RDCS Referring Phys: [id number] [facility]  Sonographer Comments: Image acquisition challenging due to uncooperative patient and dementia. IMPRESSIONS  1. Left ventricular ejection fraction, by estimation, is 60 to 65%. The left ventricle has normal function. The left ventricle has no regional wall motion abnormalities. Left ventricular diastolic parameters are consistent with Grade I diastolic dysfunction (impaired relaxation).  2. Right ventricular systolic function is normal. The right ventricular size is normal. There is normal pulmonary artery systolic pressure. The estimated right ventricular systolic pressure is 19.3 mmHg.  3. The mitral valve is grossly normal. Mild mitral valve regurgitation. No evidence of mitral stenosis.  4. The aortic valve is tricuspid. There is moderate calcification of the aortic valve. There is moderate thickening of the aortic valve. Aortic valve regurgitation is not visualized. Mild aortic valve stenosis. Aortic valve mean gradient measures 10.7 mmHg. Aortic valve Vmax measures 2.22 m/s.  5. The inferior vena cava is normal in size with greater than 50% respiratory variability, suggesting right atrial pressure of 3 mmHg. Comparison(s): No significant change from prior study. FINDINGS  Left Ventricle: Left ventricular  ejection fraction, by estimation, is 60 to 65%. The left ventricle has normal function. The left ventricle has no regional wall motion abnormalities. The left ventricular internal cavity size was normal in size. There is  no left ventricular hypertrophy. Left ventricular diastolic parameters are consistent with Grade I diastolic dysfunction (impaired relaxation). Right Ventricle: The right ventricular size is normal. No increase in right ventricular wall thickness. Right ventricular systolic function is normal. There is normal pulmonary artery systolic pressure. The tricuspid regurgitant velocity is 2.02 m/s, and  with an assumed right atrial pressure of 3 mmHg, the estimated right ventricular systolic pressure is 19.3 mmHg. Left Atrium: Left atrial size was normal in size. Right Atrium: Right atrial size was normal in size. Pericardium: Trivial pericardial effusion is present. Mitral Valve: The mitral valve is grossly normal. Mild mitral valve regurgitation. No evidence of mitral valve stenosis. Tricuspid Valve: The tricuspid valve is grossly normal. Tricuspid valve regurgitation is trivial. No evidence of tricuspid stenosis. Aortic Valve: The aortic valve is tricuspid. There is moderate calcification of the aortic valve. There is moderate thickening of the aortic valve. Aortic valve regurgitation is not visualized. Mild aortic stenosis is present. Aortic valve mean gradient measures 10.7 mmHg. Aortic valve peak gradient measures 19.8 mmHg. Aortic valve area, by VTI measures 1.39 cm. Pulmonic Valve: The pulmonic valve was grossly normal. Pulmonic valve regurgitation is mild. No evidence of pulmonic stenosis. Aorta: The aortic root and ascending aorta are structurally normal, with no evidence of dilitation. Venous: The inferior vena cava is normal in size with greater than 50% respiratory variability, suggesting right atrial pressure of 3 mmHg. IAS/Shunts: The atrial septum is grossly normal.  LEFT VENTRICLE PLAX  2D LVIDd:         3.80 cm   Diastology LVIDs:         2.40 cm   LV e' medial:    8.70 cm/s LV PW:         0.90 cm   LV E/e' medial:  10.9 LV IVS:        0.90  cm   LV e' lateral:   7.83 cm/s LVOT diam:     2.10 cm   LV E/e' lateral: 12.1 LV SV:         64 LV SV Index:   37 LVOT Area:     3.46 cm  RIGHT VENTRICLE             IVC RV S prime:     15.10 cm/s  IVC diam: 1.30 cm TAPSE (M-mode): 2.3 cm LEFT ATRIUM             Index        RIGHT ATRIUM           Index LA diam:        3.00 cm 1.74 cm/m   RA Area:     15.30 cm LA Vol (A2C):   36.4 ml 21.17 ml/m  RA Volume:   39.60 ml  23.03 ml/m LA Vol (A4C):   51.5 ml 29.95 ml/m LA Biplane Vol: 47.7 ml 27.74 ml/m  AORTIC VALVE AV Area (Vmax):    1.27 cm AV Area (Vmean):   1.21 cm AV Area (VTI):     1.39 cm AV Vmax:           222.33 cm/s AV Vmean:          149.000 cm/s AV VTI:            0.460 m AV Peak Grad:      19.8 mmHg AV Mean Grad:      10.7 mmHg LVOT Vmax:         81.30 cm/s LVOT Vmean:        52.100 cm/s LVOT VTI:          0.184 m LVOT/AV VTI ratio: 0.40  AORTA Ao Root diam: 3.00 cm Ao Asc diam:  2.60 cm MITRAL VALVE               TRICUSPID VALVE MV Area (PHT): 2.42 cm    TR Peak grad:   16.3 mmHg MV Decel Time: 313 msec    TR Vmax:        202.00 cm/s MV E velocity: 94.70 cm/s MV A velocity: 90.10 cm/s  SHUNTS MV E/A ratio:  1.05        Systemic VTI:  0.18 m                            Systemic Diam: 2.10 cm Lennie Odor MD Electronically signed by Lennie Odor MD Signature Date/Time: 12/09/2022/6:09:25 PM    Final    DG Forearm Right  Result Date: 12/08/2022 CLINICAL DATA:  Fall with forearm pain EXAM: RIGHT FOREARM - 2 VIEW COMPARISON:  None Available. FINDINGS: No acute fracture or dislocation. No elbow joint effusion. No significant soft tissue swelling or radiopaque foreign object. IMPRESSION: No acute osseous abnormality. Electronically Signed   By: Jeronimo Greaves M.D.   On: 12/08/2022 11:39   DG Chest Port 1 View  Result Date:  12/08/2022 CLINICAL DATA:  89 year old male status post fall. EXAM: PORTABLE CHEST 1 VIEW COMPARISON:  Cervical spine CT 1004 hours today. Chest radiographs 07/24/2022. FINDINGS: Portable AP view at 1013 hours. Ongoing blunting of both lung bases suggesting chronic pleural effusions. Lung base ventilation appears mildly improved from January. No superimposed pneumothorax, pulmonary edema, air bronchograms. Normal cardiac size and mediastinal contours. Visualized tracheal air column is within normal limits. No acute osseous abnormality identified. Negative  visible bowel gas. IMPRESSION: 1. Bilateral pleural effusions persist since January with mildly improved lung base ventilation since that time. 2. No acute traumatic injury identified. Electronically Signed   By: Odessa Fleming M.D.   On: 12/08/2022 10:21   CT CERVICAL SPINE WO CONTRAST  Result Date: 12/08/2022 CLINICAL DATA:  Polytrauma, blunt.  Fall. EXAM: CT CERVICAL SPINE WITHOUT CONTRAST TECHNIQUE: Multidetector CT imaging of the cervical spine was performed without intravenous contrast. Multiplanar CT image reconstructions were also generated. RADIATION DOSE REDUCTION: This exam was performed according to the departmental dose-optimization program which includes automated exposure control, adjustment of the mA and/or kV according to patient size and/or use of iterative reconstruction technique. COMPARISON:  06/25/2022 FINDINGS: Alignment: No subluxation. Skull base and vertebrae: No acute fracture. No primary bone lesion or focal pathologic process. Soft tissues and spinal canal: No prevertebral fluid or swelling. No visible canal hematoma. Disc levels: Moderate to advanced diffuse degenerative disc disease with disc space narrowing and spurring, and bilateral degenerative facet disease. Upper chest: No acute findings. Other: None IMPRESSION: Diffuse cervical spondylosis.  No acute bony abnormality. Electronically Signed   By: Charlett Nose M.D.   On: 12/08/2022  10:12   CT HEAD WO CONTRAST  Result Date: 12/08/2022 CLINICAL DATA:  Head trauma, moderate-severe.  Fall. EXAM: CT HEAD WITHOUT CONTRAST TECHNIQUE: Contiguous axial images were obtained from the base of the skull through the vertex without intravenous contrast. RADIATION DOSE REDUCTION: This exam was performed according to the departmental dose-optimization program which includes automated exposure control, adjustment of the mA and/or kV according to patient size and/or use of iterative reconstruction technique. COMPARISON:  06/25/2022 FINDINGS: Brain: There is atrophy and chronic small vessel disease changes. No acute intracranial abnormality. Specifically, no hemorrhage, hydrocephalus, mass lesion, acute infarction, or significant intracranial injury. Vascular: No hyperdense vessel or unexpected calcification. Skull: No acute calvarial abnormality. Sinuses/Orbits: No acute findings. Mucosal thickening throughout the paranasal sinuses. Mastoid air cells clear. Other: Soft tissue swelling in the left posterior parietal scalp. IMPRESSION: Atrophy, chronic microvascular disease. No acute intracranial abnormality. Electronically Signed   By: Charlett Nose M.D.   On: 12/08/2022 10:10   (Echo, Carotid, EGD, Colonoscopy, ERCP)    Subjective: Patient seen and examined.  Pleasant.  Unable to express any concerns.   Discharge Exam: Vitals:   12/12/22 0842 12/12/22 1234  BP: (!) 111/58 (!) 112/51  Pulse: 62 70  Resp: 16 19  Temp: 97.8 F (36.6 C) 97.6 F (36.4 C)  SpO2: 96% 95%   Vitals:   12/12/22 0344 12/12/22 0500 12/12/22 0842 12/12/22 1234  BP: 117/70  (!) 111/58 (!) 112/51  Pulse: 70  62 70  Resp: 15  16 19   Temp: 98.3 F (36.8 C)  97.8 F (36.6 C) 97.6 F (36.4 C)  TempSrc: Oral  Oral   SpO2: 96%  96% 95%  Weight:  57.6 kg    Height:       General: Chronically sick looking, frail and debilitated gentleman laying in bed. Dressing on the posterior scalp, intact and  dry. Cardiovascular: S1-S2 normal.  Regular rate rhythm. Respiratory: Bilateral clear. Gastrointestinal: Soft.  Nontender. Ext: No swelling or edema. Neuro: Patient is alert and awake but not oriented.  Mumbles something but incomprehensible speech.  Moves all extremities.  Very hard of hearing.     The results of significant diagnostics from this hospitalization (including imaging, microbiology, ancillary and laboratory) are listed below for reference.     Microbiology: No results found  for this or any previous visit (from the past 240 hour(s)).   Labs: BNP (last 3 results) Recent Labs    12/08/22 0920  BNP 36.4   Basic Metabolic Panel: Recent Labs  Lab 12/08/22 0920 12/08/22 1135 12/08/22 1559 12/08/22 2145 12/09/22 0220 12/10/22 0711  NA 134*  --   --   --  135 144  K 2.6*  --   --  3.7 3.3* 4.4  CL 96*  --   --   --  103 114*  CO2 25  --   --   --  24 24  GLUCOSE 107*  --   --   --  106* 100*  BUN 51*  --   --   --  34* 24*  CREATININE 1.48*  --   --   --  1.15 0.99  CALCIUM 8.6*  --   --   --  8.3* 8.1*  MG  --  2.4  --   --   --   --   PHOS  --   --  3.4  --   --   --    Liver Function Tests: Recent Labs  Lab 12/08/22 0920 12/10/22 0711  AST 23 17  ALT 13 12  ALKPHOS 53 45  BILITOT 0.5 0.4  PROT 6.5 5.6*  ALBUMIN 3.3* 2.6*   No results for input(s): "LIPASE", "AMYLASE" in the last 168 hours. No results for input(s): "AMMONIA" in the last 168 hours. CBC: Recent Labs  Lab 12/08/22 0920 12/10/22 0711  WBC 10.6* 9.1  NEUTROABS 5.8  --   HGB 12.7* 11.5*  HCT 38.0* 35.8*  MCV 99.5 100.3*  PLT 334 314   Cardiac Enzymes: No results for input(s): "CKTOTAL", "CKMB", "CKMBINDEX", "TROPONINI" in the last 168 hours. BNP: Invalid input(s): "POCBNP" CBG: Recent Labs  Lab 12/09/22 0745 12/09/22 2105 12/10/22 0538 12/11/22 0536 12/12/22 0515  GLUCAP 117* 101* 98 115* 99   D-Dimer No results for input(s): "DDIMER" in the last 72 hours. Hgb  A1c No results for input(s): "HGBA1C" in the last 72 hours. Lipid Profile No results for input(s): "CHOL", "HDL", "LDLCALC", "TRIG", "CHOLHDL", "LDLDIRECT" in the last 72 hours. Thyroid function studies No results for input(s): "TSH", "T4TOTAL", "T3FREE", "THYROIDAB" in the last 72 hours.  Invalid input(s): "FREET3" Anemia work up No results for input(s): "VITAMINB12", "FOLATE", "FERRITIN", "TIBC", "IRON", "RETICCTPCT" in the last 72 hours. Urinalysis    Component Value Date/Time   COLORURINE YELLOW 12/08/2022 0932   APPEARANCEUR CLEAR 12/08/2022 0932   LABSPEC 1.010 12/08/2022 0932   PHURINE 6.0 12/08/2022 0932   GLUCOSEU NEGATIVE 12/08/2022 0932   HGBUR NEGATIVE 12/08/2022 0932   BILIRUBINUR NEGATIVE 12/08/2022 0932   KETONESUR NEGATIVE 12/08/2022 0932   PROTEINUR NEGATIVE 12/08/2022 0932   NITRITE NEGATIVE 12/08/2022 0932   LEUKOCYTESUR NEGATIVE 12/08/2022 0932   Sepsis Labs Recent Labs  Lab 12/08/22 0920 12/10/22 0711  WBC 10.6* 9.1   Microbiology No results found for this or any previous visit (from the past 240 hour(s)).   Time coordinating discharge: 32 minutes  SIGNED:   Dorcas Carrow, MD  [facility] 12/12/2022, 2:53 PM

## 2022-12-12 NOTE — Progress Notes (Addendum)
Physical Therapy Treatment Patient Details Name: Stuart Watson MRN: [id number] DOB: 08/13/1931 Today's Date: 12/12/2022   History of Present Illness Pt is a 89 year old male admitted on 12/08/22 for a syncopal episode at home. Past medical history significant of advanced dementia, temporal arthritis, bilateral benign pleural effusion s/p Pleurx, chronic HFpEF, CKD stage IIIb, HTN, BPH.    PT Comments    Pt tolerated treatment well today. Pt was able to stand at bedside today with +2 HHA Max A for orthostatics. BP: 101/53 supine, BP: 102/59 seated, BP: 96/73 standing, BP: 116/55 standing after 3 minutes. Pt demonstrated much improved cognition today and was able to hold a conversation. No change in DC/DME recs at this time. PT will continue to follow.   Recommendations for follow up therapy are one component of a multi-disciplinary discharge planning process, led by the attending physician.  Recommendations may be updated based on patient status, additional functional criteria and insurance authorization.  Follow Up Recommendations  Can patient physically be transported by private vehicle: No    Assistance Recommended at Discharge Frequent or constant Supervision/Assistance  Patient can return home with the following Two people to help with walking and/or transfers;A lot of help with bathing/dressing/bathroom;Assistance with cooking/housework;Assistance with feeding;Direct supervision/assist for medications management;Direct supervision/assist for financial management;Assist for transportation;Help with stairs or ramp for entrance   Equipment Recommendations  Other (comment) (Per accepting facility)    Recommendations for Other Services       Precautions / Restrictions Precautions Precautions: Fall Restrictions Weight Bearing Restrictions: No     Mobility  Bed Mobility Overal bed mobility: Needs Assistance Bed Mobility: Supine to Sit, Sit to Supine     Supine to sit: +2 for  physical assistance, Max assist Sit to supine: +2 for physical assistance, Max assist   General bed mobility comments: Assistance with trunk and BLE management.    Transfers Overall transfer level: Needs assistance Equipment used: 2 person hand held assist Transfers: Sit to/from Stand Sit to Stand: +2 physical assistance, Max assist           General transfer comment: Pt able to stand ~5 minutes with Mod A however demonstrated heavy posterior lean. Pt also able to take side steps along EOB.    Ambulation/Gait                   Stairs             Wheelchair Mobility    Modified Rankin (Stroke Patients Only)       Balance Overall balance assessment: Needs assistance Sitting-balance support: Bilateral upper extremity supported, Feet supported Sitting balance-Leahy Scale: Fair   Postural control: Posterior lean Standing balance support: Bilateral upper extremity supported, During functional activity, Reliant on assistive device for balance Standing balance-Leahy Scale: Poor Standing balance comment: Reliant on +2 HHA                            Cognition Arousal/Alertness: Awake/alert Behavior During Therapy: Flat affect Overall Cognitive Status: History of cognitive impairments - at baseline                                 General Comments: Pt demonstrated much improved cognition today and was able to follow commands consistently as well as hold a conversation.        Exercises      General Comments  General comments (skin integrity, edema, etc.): BP: 101/53 supine, BP: 102/59 seated, BP: 96/73 standing, BP: 116/55 standing 3 minutes      Pertinent Vitals/Pain Pain Assessment Pain Assessment: No/denies pain    Home Living                          Prior Function            PT Goals (current goals can now be found in the care plan section) Progress towards PT goals: Progressing toward goals     Frequency    Min 1X/week      PT Plan Current plan remains appropriate    Co-evaluation              AM-PAC PT "6 Clicks" Mobility   Outcome Measure  Help needed turning from your back to your side while in a flat bed without using bedrails?: A Lot Help needed moving from lying on your back to sitting on the side of a flat bed without using bedrails?: A Lot Help needed moving to and from a bed to a chair (including a wheelchair)?: A Lot Help needed standing up from a chair using your arms (e.g., wheelchair or bedside chair)?: A Lot Help needed to walk in hospital room?: Total Help needed climbing 3-5 steps with a railing? : Total 6 Click Score: 10    End of Session Equipment Utilized During Treatment: Gait belt Activity Tolerance: Patient tolerated treatment well Patient left: in bed;with call bell/phone within reach;with bed alarm set Nurse Communication: Mobility status PT Visit Diagnosis: Other abnormalities of gait and mobility (R26.89);Repeated falls (R29.6);Muscle weakness (generalized) (M62.81);Difficulty in walking, not elsewhere classified (R26.2)     Time: 1610-9604 PT Time Calculation (min) (ACUTE ONLY): 18 min  Charges:  $Therapeutic Activity: 8-22 mins                     Shela Nevin, PT, DPT Acute Rehab Services [phone]    Gladys Damme 12/12/2022, 2:51 PM

## 2022-12-12 NOTE — TOC Progression Note (Signed)
Transition of Care [facility]) - Progression Note    Patient Details  Name: Stuart Watson MRN: [id number] Date of Birth: [birth year]  [facility] CM/SW Contact  Delilah Shan, LCSWA Phone Number: 12/12/2022, 3:54 PM  Clinical Narrative:     Patient has SNF bed at [facility]. Katie with [facility] confirmed they can accept patient tomorrow if medically ready. CSW informed MD. CSW will continue to follow and assist with patients dc planning needs.    Barriers to Discharge: Continued Medical Work up  Expected Discharge Plan and Services In-house Referral: Clinical Social Work     Living arrangements for the past 2 months: Assisted Living Facility ([facility]) Expected Discharge Date: 12/12/22                                     Social Determinants of Health (SDOH) Interventions SDOH Screenings   Food Insecurity: No Food Insecurity (03/19/2022)  Transportation Needs: No Transportation Needs (03/19/2022)  Alcohol Screen: Low Risk  (03/19/2022)  Depression (PHQ2-9): Low Risk  (08/08/2022)  Financial Resource Strain: Low Risk  (03/19/2022)  Physical Activity: Insufficiently Active (03/19/2022)  Social Connections: Socially Isolated (03/19/2022)  Stress: No Stress Concern Present (03/19/2022)  Tobacco Use: Medium Risk (12/08/2022)    Readmission Risk Interventions     No data to display

## 2022-12-12 NOTE — Care Management Important Message (Signed)
Important Message  Patient Details  Name: Stuart Watson MRN: [id number] Date of Birth: 09/21/1931   Medicare Important Message Given:  Yes     Sherilyn Banker 12/12/2022, 12:10 PM

## 2022-12-12 NOTE — Progress Notes (Signed)
PROGRESS NOTE    Stuart Watson  ZOX:[id number] DOB: [birth year] DOA: 12/08/2022 PCP: Mahlon Gammon, MD    Brief Narrative:  Patient is a 89 year old gentleman with history of dementia, TIA, systolic heart failure, GERD, CKD stage IIIb from assisted living facility presented with syncopal episode.  Patient was at the sink brushing his teeth with the help of an aide when he fell back and hit his head with a hematoma on the back of his head.  Recently has been getting more unsteady on his feet.  Patient also on torsemide and Aldactone as well as Zaroxolyn.  Recently seen by primary care physician on 5/17 with concern for bradycardia with heart rate of 42.  In the emergency room hemodynamically stable.  Blood pressure 108/48.  Afebrile.  On room air.  Was looking weak and dry.  Had significant orthostatic drop in blood pressure and unable to stand up so admitted to the hospital.   Assessment & Plan:   Syncope and collapse secondary to orthostatic hypotension: Multifactorial.  Autonomic dysfunction, multiple antihypertensive medications and diuretics. Resuscitated with gentle IV fluids.  Supine blood pressures are adequate.  Will check orthostatic blood pressures. IV fluid discontinued.  Demadex and Aldactone discontinued. Echocardiogram with ejection fraction 60 to 65%, grade 1 diastolic dysfunction.  Clinically euvolemic. Continue to work with PT OT and refer to SNF level of care.  Electrolytes, severe hypokalemia: Adequately replaced.  Magnesium is adequate.  Chronic systolic heart failure: Presented with clinical dehydration.  Treated with IV fluids.  Currently euvolemic and on room air.  Will discontinue all diuretics.  Hypothyroidism, on Synthroid Advanced dementia, on Aricept and Namenda BPH, on doxazosin CKD 3B, fairly stable.  Patient from ALF.  PT recommended skilled nursing facility.  Will make sure patient can participate with therapy and does not collapse understanding before  discharging him to the SNF   DVT prophylaxis: heparin injection 5,000 Units Start: 12/08/22 2200   Code Status: DNR Family Communication: None at the bedside Disposition Plan: Status is: Inpatient Remains inpatient appropriate because: Significant orthostatic hypotension     Consultants:  None  Procedures:  None  Antimicrobials:  None   Subjective: Patient seen and examined.  Poor historian.  Cannot relate any interview questions.  Last worked with physical therapy on 5/19 and was unable to stand.  He has not work with physical therapy yet. Needs more than 2 people support to get out of the bed, orthostatics not checked.  Objective: Vitals:   12/12/22 0344 12/12/22 0500 12/12/22 0842 12/12/22 1234  BP: 117/70  (!) 111/58 (!) 112/51  Pulse: 70  62 70  Resp: 15  16 19   Temp: 98.3 F (36.8 C)  97.8 F (36.6 C) 97.6 F (36.4 C)  TempSrc: Oral  Oral   SpO2: 96%  96% 95%  Weight:  57.6 kg    Height:        Intake/Output Summary (Last 24 hours) at 12/12/2022 1428 Last data filed at 12/12/2022 1401 Gross per 24 hour  Intake 472 ml  Output 1650 ml  Net -1178 ml   Filed Weights   12/10/22 0531 12/11/22 0313 12/12/22 0500  Weight: 56.9 kg 57.8 kg 57.6 kg    Examination:  General: Chronically sick looking, frail and debilitated gentleman laying in bed. Cardiovascular: S1-S2 normal.  Regular rate rhythm. Respiratory: Bilateral clear. Gastrointestinal: Soft.  Nontender. Ext: No swelling or edema. Neuro: Patient is alert and awake but not oriented.  Mumbles something but incomprehensible speech.  Moves all extremities.  Very hard of hearing.     Data Reviewed: I have personally reviewed following labs and imaging studies  CBC: Recent Labs  Lab 12/08/22 0920 12/10/22 0711  WBC 10.6* 9.1  NEUTROABS 5.8  --   HGB 12.7* 11.5*  HCT 38.0* 35.8*  MCV 99.5 100.3*  PLT 334 314   Basic Metabolic Panel: Recent Labs  Lab 12/08/22 0920 12/08/22 1135  12/08/22 1559 12/08/22 2145 12/09/22 0220 12/10/22 0711  NA 134*  --   --   --  135 144  K 2.6*  --   --  3.7 3.3* 4.4  CL 96*  --   --   --  103 114*  CO2 25  --   --   --  24 24  GLUCOSE 107*  --   --   --  106* 100*  BUN 51*  --   --   --  34* 24*  CREATININE 1.48*  --   --   --  1.15 0.99  CALCIUM 8.6*  --   --   --  8.3* 8.1*  MG  --  2.4  --   --   --   --   PHOS  --   --  3.4  --   --   --    GFR: Estimated Creatinine Clearance: 42 mL/min (by C-G formula based on SCr of 0.99 mg/dL). Liver Function Tests: Recent Labs  Lab 12/08/22 0920 12/10/22 0711  AST 23 17  ALT 13 12  ALKPHOS 53 45  BILITOT 0.5 0.4  PROT 6.5 5.6*  ALBUMIN 3.3* 2.6*   No results for input(s): "LIPASE", "AMYLASE" in the last 168 hours. No results for input(s): "AMMONIA" in the last 168 hours. Coagulation Profile: No results for input(s): "INR", "PROTIME" in the last 168 hours. Cardiac Enzymes: No results for input(s): "CKTOTAL", "CKMB", "CKMBINDEX", "TROPONINI" in the last 168 hours. BNP (last 3 results) No results for input(s): "PROBNP" in the last 8760 hours. HbA1C: No results for input(s): "HGBA1C" in the last 72 hours. CBG: Recent Labs  Lab 12/09/22 0745 12/09/22 2105 12/10/22 0538 12/11/22 0536 12/12/22 0515  GLUCAP 117* 101* 98 115* 99   Lipid Profile: No results for input(s): "CHOL", "HDL", "LDLCALC", "TRIG", "CHOLHDL", "LDLDIRECT" in the last 72 hours. Thyroid Function Tests: No results for input(s): "TSH", "T4TOTAL", "FREET4", "T3FREE", "THYROIDAB" in the last 72 hours. Anemia Panel: No results for input(s): "VITAMINB12", "FOLATE", "FERRITIN", "TIBC", "IRON", "RETICCTPCT" in the last 72 hours. Sepsis Labs: No results for input(s): "PROCALCITON", "LATICACIDVEN" in the last 168 hours.  No results found for this or any previous visit (from the past 240 hour(s)).       Radiology Studies: DG Abd 1 View  Result Date: 12/11/2022 CLINICAL DATA:  Abdominal pain for a few  days EXAM: ABDOMEN - 1 VIEW COMPARISON:  X-ray 12/11/2022 FINDINGS: Scattered colonic stool. Overall moderate stool burden. Gas is seen in nondilated loops of small and large bowel. There is air along the rectum. Stomach is mildly distended. No obvious free air on these supine radiographs. Note is made of bilateral pleural effusions and adjacent lung opacities. Please correlate with the x-ray of the chest from 12/08/2022. Scattered vascular calcifications. Osteopenia and degenerative changes. IMPRESSION: Moderate colonic stool.  Nonspecific bowel gas pattern Electronically Signed   By: Karen Kays M.D.   On: 12/11/2022 16:19        Scheduled Meds:  allopurinol  150 mg Oral q AM  aspirin  81 mg Oral q AM   donepezil  5 mg Oral QHS   doxazosin  4 mg Oral Daily   escitalopram  10 mg Oral Daily   heparin  5,000 Units Subcutaneous Q12H   levothyroxine  25 mcg Oral QAC breakfast   memantine  10 mg Oral BID   mouth rinse  15 mL Mouth Rinse 4 times per day   pantoprazole  20 mg Oral QAC breakfast   potassium chloride SA  40 mEq Oral Daily   simvastatin  20 mg Oral QPM   sodium chloride flush  3 mL Intravenous Q12H   Continuous Infusions:   LOS: 3 days    Time spent: 35 minutes    Dorcas Carrow, MD [facility] Pager [phone]

## 2022-12-13 DIAGNOSIS — E039 Hypothyroidism, unspecified: Secondary | ICD-10-CM | POA: Diagnosis not present

## 2022-12-13 DIAGNOSIS — R4189 Other symptoms and signs involving cognitive functions and awareness: Secondary | ICD-10-CM | POA: Diagnosis not present

## 2022-12-13 DIAGNOSIS — K219 Gastro-esophageal reflux disease without esophagitis: Secondary | ICD-10-CM | POA: Diagnosis not present

## 2022-12-13 DIAGNOSIS — M109 Gout, unspecified: Secondary | ICD-10-CM | POA: Diagnosis not present

## 2022-12-13 DIAGNOSIS — Z7401 Bed confinement status: Secondary | ICD-10-CM | POA: Diagnosis not present

## 2022-12-13 DIAGNOSIS — E785 Hyperlipidemia, unspecified: Secondary | ICD-10-CM | POA: Diagnosis not present

## 2022-12-13 DIAGNOSIS — R350 Frequency of micturition: Secondary | ICD-10-CM | POA: Diagnosis not present

## 2022-12-13 DIAGNOSIS — J9811 Atelectasis: Secondary | ICD-10-CM | POA: Diagnosis not present

## 2022-12-13 DIAGNOSIS — J9809 Other diseases of bronchus, not elsewhere classified: Secondary | ICD-10-CM | POA: Diagnosis not present

## 2022-12-13 DIAGNOSIS — R1312 Dysphagia, oropharyngeal phase: Secondary | ICD-10-CM | POA: Diagnosis not present

## 2022-12-13 DIAGNOSIS — E782 Mixed hyperlipidemia: Secondary | ICD-10-CM | POA: Diagnosis not present

## 2022-12-13 DIAGNOSIS — J90 Pleural effusion, not elsewhere classified: Secondary | ICD-10-CM | POA: Diagnosis not present

## 2022-12-13 DIAGNOSIS — Z9889 Other specified postprocedural states: Secondary | ICD-10-CM | POA: Diagnosis not present

## 2022-12-13 DIAGNOSIS — N400 Enlarged prostate without lower urinary tract symptoms: Secondary | ICD-10-CM | POA: Diagnosis not present

## 2022-12-13 DIAGNOSIS — F339 Major depressive disorder, recurrent, unspecified: Secondary | ICD-10-CM | POA: Diagnosis not present

## 2022-12-13 DIAGNOSIS — R059 Cough, unspecified: Secondary | ICD-10-CM | POA: Diagnosis not present

## 2022-12-13 DIAGNOSIS — I10 Essential (primary) hypertension: Secondary | ICD-10-CM | POA: Diagnosis not present

## 2022-12-13 DIAGNOSIS — B351 Tinea unguium: Secondary | ICD-10-CM | POA: Diagnosis not present

## 2022-12-13 DIAGNOSIS — E876 Hypokalemia: Secondary | ICD-10-CM | POA: Diagnosis not present

## 2022-12-13 DIAGNOSIS — I700 Atherosclerosis of aorta: Secondary | ICD-10-CM | POA: Diagnosis not present

## 2022-12-13 DIAGNOSIS — S0003XD Contusion of scalp, subsequent encounter: Secondary | ICD-10-CM | POA: Diagnosis not present

## 2022-12-13 DIAGNOSIS — I5022 Chronic systolic (congestive) heart failure: Secondary | ICD-10-CM | POA: Diagnosis not present

## 2022-12-13 DIAGNOSIS — J9819 Other pulmonary collapse: Secondary | ICD-10-CM | POA: Diagnosis not present

## 2022-12-13 DIAGNOSIS — J942 Hemothorax: Secondary | ICD-10-CM | POA: Diagnosis not present

## 2022-12-13 DIAGNOSIS — R55 Syncope and collapse: Secondary | ICD-10-CM | POA: Diagnosis not present

## 2022-12-13 DIAGNOSIS — G8911 Acute pain due to trauma: Secondary | ICD-10-CM | POA: Diagnosis not present

## 2022-12-13 DIAGNOSIS — N183 Chronic kidney disease, stage 3 unspecified: Secondary | ICD-10-CM | POA: Diagnosis not present

## 2022-12-13 DIAGNOSIS — G309 Alzheimer's disease, unspecified: Secondary | ICD-10-CM | POA: Diagnosis not present

## 2022-12-13 DIAGNOSIS — M1991 Primary osteoarthritis, unspecified site: Secondary | ICD-10-CM | POA: Diagnosis not present

## 2022-12-13 DIAGNOSIS — R918 Other nonspecific abnormal finding of lung field: Secondary | ICD-10-CM | POA: Diagnosis not present

## 2022-12-13 DIAGNOSIS — M1990 Unspecified osteoarthritis, unspecified site: Secondary | ICD-10-CM | POA: Diagnosis not present

## 2022-12-13 DIAGNOSIS — Z8673 Personal history of transient ischemic attack (TIA), and cerebral infarction without residual deficits: Secondary | ICD-10-CM | POA: Diagnosis not present

## 2022-12-13 DIAGNOSIS — F0390 Unspecified dementia without behavioral disturbance: Secondary | ICD-10-CM | POA: Diagnosis not present

## 2022-12-13 DIAGNOSIS — R2689 Other abnormalities of gait and mobility: Secondary | ICD-10-CM | POA: Diagnosis not present

## 2022-12-13 DIAGNOSIS — E559 Vitamin D deficiency, unspecified: Secondary | ICD-10-CM | POA: Diagnosis not present

## 2022-12-13 DIAGNOSIS — L605 Yellow nail syndrome: Secondary | ICD-10-CM | POA: Diagnosis not present

## 2022-12-13 DIAGNOSIS — F331 Major depressive disorder, recurrent, moderate: Secondary | ICD-10-CM | POA: Diagnosis not present

## 2022-12-13 DIAGNOSIS — I959 Hypotension, unspecified: Secondary | ICD-10-CM | POA: Diagnosis not present

## 2022-12-13 DIAGNOSIS — M1A9XX0 Chronic gout, unspecified, without tophus (tophi): Secondary | ICD-10-CM | POA: Diagnosis not present

## 2022-12-13 DIAGNOSIS — R1310 Dysphagia, unspecified: Secondary | ICD-10-CM | POA: Diagnosis not present

## 2022-12-13 DIAGNOSIS — N189 Chronic kidney disease, unspecified: Secondary | ICD-10-CM | POA: Diagnosis not present

## 2022-12-13 DIAGNOSIS — M6281 Muscle weakness (generalized): Secondary | ICD-10-CM | POA: Diagnosis not present

## 2022-12-13 DIAGNOSIS — N1831 Chronic kidney disease, stage 3a: Secondary | ICD-10-CM | POA: Diagnosis not present

## 2022-12-13 DIAGNOSIS — F02A11 Dementia in other diseases classified elsewhere, mild, with agitation: Secondary | ICD-10-CM | POA: Diagnosis not present

## 2022-12-13 DIAGNOSIS — Z91118 Patient's noncompliance with dietary regimen for other reason: Secondary | ICD-10-CM | POA: Diagnosis not present

## 2022-12-13 DIAGNOSIS — Z91119 Patient's noncompliance with dietary regimen due to unspecified reason: Secondary | ICD-10-CM | POA: Diagnosis not present

## 2022-12-13 DIAGNOSIS — R404 Transient alteration of awareness: Secondary | ICD-10-CM | POA: Diagnosis not present

## 2022-12-13 LAB — GLUCOSE, CAPILLARY: Glucose-Capillary: 102 mg/dL — ABNORMAL HIGH (ref 70–99)

## 2022-12-13 NOTE — TOC Transition Note (Addendum)
Transition of Care [facility]) - CM/SW Discharge Note   Patient Details  Name: Stuart Watson MRN: [id number] Date of Birth: Dec 22, 1931  [facility] CM/SW Contact:  Delilah Shan, LCSWA Phone Number: 12/13/2022, 11:16 AM   Clinical Narrative:     CSW spoke with Katie with [facility] who confirmed patient can DC over today if medically ready. CSW informed MD.  Patient will DC to: [facility]   Anticipated DC date: 12/13/2022  Family notified: Chief Executive Officer by: Sharin Mons  ?  Per MD patient ready for DC to [facility] . RN, patient, patient's family, and facility notified of DC. Discharge Summary sent to facility. RN given number for report tele# [phone] Ex:[id number] RM# 61 [place]. DC packet on chart. DNR signed by MD attached to patients DC packet.Ambulance transport requested for patient.  CSW signing off.   Final next level of care: Skilled Nursing Facility Barriers to Discharge: No Barriers Identified   Patient Goals and CMS Choice CMS Medicare.gov Compare Post Acute Care list provided to:: Patient Represenative (must comment) (patients son) Choice offered to / list presented to : Adult Children  Discharge Placement                Patient chooses bed at: [facility] Patient to be transferred to facility by: [facility] Name of family member notified: [name] Patient and family notified of of transfer: 12/13/22  Discharge Plan and Services Additional resources added to the After Visit Summary for   In-house Referral: Clinical Social Work                                   Social Determinants of Health (SDOH) Interventions SDOH Screenings   Food Insecurity: No Food Insecurity (03/19/2022)  Transportation Needs: No Transportation Needs (03/19/2022)  Alcohol Screen: Low Risk  (03/19/2022)  Depression (PHQ2-9): Low Risk  (08/08/2022)  Financial Resource Strain: Low Risk  (03/19/2022)  Physical Activity: Insufficiently Active  (03/19/2022)  Social Connections: Socially Isolated (03/19/2022)  Stress: No Stress Concern Present (03/19/2022)  Tobacco Use: Medium Risk (12/08/2022)     Readmission Risk Interventions     No data to display

## 2022-12-13 NOTE — Discharge Summary (Signed)
Physician Discharge Summary  Stuart Watson ZOX:[id number] DOB: February 06, 1932 DOA: 12/08/2022  PCP: Mahlon Gammon, MD  Admit date: 12/08/2022 Discharge date: 12/13/2022  Admitted From: Assisted living facility Disposition: Skilled nursing facility  Recommendations for Outpatient Follow-up:  Follow up with PCP in 1-2 weeks Please obtain BMP/CBC in one week   Discharge Condition: Fair CODE STATUS: DNR Diet recommendation: Low-salt diet  Discharge summary: Patient is a 89 year old gentleman with history of dementia, TIA, systolic heart failure, GERD, CKD stage IIIb from assisted living facility presented with syncopal episode.  Patient was at the sink brushing his teeth with the help of an aide when he fell back and hit his head with a hematoma on the back of his head.  Recently has been getting more unsteady on his feet.  Patient also on torsemide and Aldactone as well as Zaroxolyn.  Recently seen by primary care physician on 5/17 with concern for bradycardia with heart rate of 42.  In the emergency room hemodynamically stable.  Blood pressure 108/48.  Afebrile.  On room air.  Was looking weak and dry.  Had significant orthostatic drop in blood pressure and unable to stand up so admitted to the hospital.     Assessment & Plan of care:   Syncope and collapse secondary to orthostatic hypotension: Multifactorial.  Autonomic dysfunction, multiple antihypertensive medications and diuretics. Resuscitated with gentle IV fluids.  Supine blood pressures are adequate and now tolerating standing up.  No orthostatic drop in blood pressure today.  Off IV fluids.  Patient on Demadex 60 mg twice daily and Aldactone at home.  He is euvolemic and high risk of dehydration.  Will go back on Demadex 20 mg daily along with potassium supplementation.  Will discontinue Aldactone.  Echocardiogram with ejection fraction 60 to 65%, grade 1 diastolic dysfunction.  Clinically euvolemic. Continue to work with PT OT and  refer to SNF level of care.  Follow with risk of hematoma: Patient has repaired posterior scalp wound with staples.  Remove his staples in 1 week 5/27.   Electrolytes, severe hypokalemia: Adequately replaced.  Magnesium is adequate.  Continue potassium supplementation.   Chronic systolic heart failure: Presented with clinical dehydration.  Treated with IV fluids.  Currently euvolemic and on room air.  Will resume low-dose of diuretics.   Hypothyroidism, on Synthroid Advanced dementia, on Aricept and Namenda BPH, on doxazosin CKD 3B, fairly stable.   Patient from ALF.  PT recommended skilled nursing facility.  Stable to transfer to SNF. Discharge Diagnoses:  Principal Problem:   Syncope and collapse Active Problems:   CKD (chronic kidney disease) stage 3, GFR 30-59 ml/min (HCC)   Hypokalemia   Fall    Discharge Instructions  Discharge Instructions     Diet - low sodium heart healthy   Complete by: As directed    Discharge instructions   Complete by: As directed    Remove scalp staple on 5/27   Discharge wound care:   Complete by: As directed    Dry dressing   Increase activity slowly   Complete by: As directed       Allergies as of 12/13/2022   No Known Allergies      Medication List     STOP taking these medications    metolazone 2.5 MG tablet Commonly known as: ZAROXOLYN   spironolactone 25 MG tablet Commonly known as: ALDACTONE       TAKE these medications    acetaminophen 500 MG tablet Commonly known as: TYLENOL Take  2 tablets (1,000 mg total) by mouth in the morning and at bedtime.   allopurinol 300 MG tablet Commonly known as: ZYLOPRIM Take 150 mg by mouth in the morning. For gout   aspirin 81 MG chewable tablet Chew 81 mg by mouth in the morning. Chew and swallow tablet   donepezil 5 MG disintegrating tablet Commonly known as: ARICEPT ODT Take 1 tablet (5 mg total) by mouth at bedtime. For co++ + What changed: additional instructions    doxazosin 4 MG tablet Commonly known as: CARDURA Take 4 mg by mouth daily.   escitalopram 10 MG tablet Commonly known as: LEXAPRO Take 1 tablet (10 mg total) by mouth daily.   levothyroxine 25 MCG tablet Commonly known as: SYNTHROID Take 25 mcg by mouth daily before breakfast.   memantine 10 MG tablet Commonly known as: NAMENDA Take 1 tablet (10 mg total) by mouth 2 (two) times daily.   pantoprazole 20 MG tablet Commonly known as: PROTONIX Take 20 mg by mouth daily before breakfast.   potassium chloride SA 20 MEQ tablet Commonly known as: KLOR-CON M Take 40 mEq by mouth daily.   simvastatin 20 MG tablet Commonly known as: ZOCOR Take 20 mg by mouth every evening.   torsemide 20 MG tablet Commonly known as: DEMADEX Take 1 tablet (20 mg total) by mouth daily. Give 3 tablet by mouth What changed:  how much to take when to take this               Discharge Care Instructions  (From admission, onward)           Start     Ordered   12/12/22 0000  Discharge wound care:       Comments: Dry dressing   12/12/22 1453            No Known Allergies  Consultations: None   Procedures/Studies: DG Abd 1 View  Result Date: 12/11/2022 CLINICAL DATA:  Abdominal pain for a few days EXAM: ABDOMEN - 1 VIEW COMPARISON:  X-ray 12/11/2022 FINDINGS: Scattered colonic stool. Overall moderate stool burden. Gas is seen in nondilated loops of small and large bowel. There is air along the rectum. Stomach is mildly distended. No obvious free air on these supine radiographs. Note is made of bilateral pleural effusions and adjacent lung opacities. Please correlate with the x-ray of the chest from 12/08/2022. Scattered vascular calcifications. Osteopenia and degenerative changes. IMPRESSION: Moderate colonic stool.  Nonspecific bowel gas pattern Electronically Signed   By: Karen Kays M.D.   On: 12/11/2022 16:19   ECHOCARDIOGRAM COMPLETE  Result Date: 12/09/2022     ECHOCARDIOGRAM REPORT   Patient Name:   Stuart Watson Date of Exam: 12/09/2022 Medical Rec #:  [id number]      Height:       69.0 in Accession #:    [id number]     Weight:       129.9 lb Date of Birth:  Mar 20, 1932      BSA:          1.720 m Patient Age:    89 years       BP:           122/52 mmHg Patient Gender: M              HR:           66 bpm. Exam Location:  Inpatient Procedure: 2D Echo, Color Doppler and Cardiac Doppler Indications:    syncope  History:  Patient has prior history of Echocardiogram examinations, most                 recent 03/13/2021. Chronic kidney disease; Risk                 Factors:Dyslipidemia.  Sonographer:    Delcie Roch RDCS Referring Phys: [id number] [facility]  Sonographer Comments: Image acquisition challenging due to uncooperative patient and dementia. IMPRESSIONS  1. Left ventricular ejection fraction, by estimation, is 60 to 65%. The left ventricle has normal function. The left ventricle has no regional wall motion abnormalities. Left ventricular diastolic parameters are consistent with Grade I diastolic dysfunction (impaired relaxation).  2. Right ventricular systolic function is normal. The right ventricular size is normal. There is normal pulmonary artery systolic pressure. The estimated right ventricular systolic pressure is 19.3 mmHg.  3. The mitral valve is grossly normal. Mild mitral valve regurgitation. No evidence of mitral stenosis.  4. The aortic valve is tricuspid. There is moderate calcification of the aortic valve. There is moderate thickening of the aortic valve. Aortic valve regurgitation is not visualized. Mild aortic valve stenosis. Aortic valve mean gradient measures 10.7 mmHg. Aortic valve Vmax measures 2.22 m/s.  5. The inferior vena cava is normal in size with greater than 50% respiratory variability, suggesting right atrial pressure of 3 mmHg. Comparison(s): No significant change from prior study. FINDINGS  Left Ventricle: Left ventricular  ejection fraction, by estimation, is 60 to 65%. The left ventricle has normal function. The left ventricle has no regional wall motion abnormalities. The left ventricular internal cavity size was normal in size. There is  no left ventricular hypertrophy. Left ventricular diastolic parameters are consistent with Grade I diastolic dysfunction (impaired relaxation). Right Ventricle: The right ventricular size is normal. No increase in right ventricular wall thickness. Right ventricular systolic function is normal. There is normal pulmonary artery systolic pressure. The tricuspid regurgitant velocity is 2.02 m/s, and  with an assumed right atrial pressure of 3 mmHg, the estimated right ventricular systolic pressure is 19.3 mmHg. Left Atrium: Left atrial size was normal in size. Right Atrium: Right atrial size was normal in size. Pericardium: Trivial pericardial effusion is present. Mitral Valve: The mitral valve is grossly normal. Mild mitral valve regurgitation. No evidence of mitral valve stenosis. Tricuspid Valve: The tricuspid valve is grossly normal. Tricuspid valve regurgitation is trivial. No evidence of tricuspid stenosis. Aortic Valve: The aortic valve is tricuspid. There is moderate calcification of the aortic valve. There is moderate thickening of the aortic valve. Aortic valve regurgitation is not visualized. Mild aortic stenosis is present. Aortic valve mean gradient measures 10.7 mmHg. Aortic valve peak gradient measures 19.8 mmHg. Aortic valve area, by VTI measures 1.39 cm. Pulmonic Valve: The pulmonic valve was grossly normal. Pulmonic valve regurgitation is mild. No evidence of pulmonic stenosis. Aorta: The aortic root and ascending aorta are structurally normal, with no evidence of dilitation. Venous: The inferior vena cava is normal in size with greater than 50% respiratory variability, suggesting right atrial pressure of 3 mmHg. IAS/Shunts: The atrial septum is grossly normal.  LEFT VENTRICLE PLAX  2D LVIDd:         3.80 cm   Diastology LVIDs:         2.40 cm   LV e' medial:    8.70 cm/s LV PW:         0.90 cm   LV E/e' medial:  10.9 LV IVS:        0.90  cm   LV e' lateral:   7.83 cm/s LVOT diam:     2.10 cm   LV E/e' lateral: 12.1 LV SV:         64 LV SV Index:   37 LVOT Area:     3.46 cm  RIGHT VENTRICLE             IVC RV S prime:     15.10 cm/s  IVC diam: 1.30 cm TAPSE (M-mode): 2.3 cm LEFT ATRIUM             Index        RIGHT ATRIUM           Index LA diam:        3.00 cm 1.74 cm/m   RA Area:     15.30 cm LA Vol (A2C):   36.4 ml 21.17 ml/m  RA Volume:   39.60 ml  23.03 ml/m LA Vol (A4C):   51.5 ml 29.95 ml/m LA Biplane Vol: 47.7 ml 27.74 ml/m  AORTIC VALVE AV Area (Vmax):    1.27 cm AV Area (Vmean):   1.21 cm AV Area (VTI):     1.39 cm AV Vmax:           222.33 cm/s AV Vmean:          149.000 cm/s AV VTI:            0.460 m AV Peak Grad:      19.8 mmHg AV Mean Grad:      10.7 mmHg LVOT Vmax:         81.30 cm/s LVOT Vmean:        52.100 cm/s LVOT VTI:          0.184 m LVOT/AV VTI ratio: 0.40  AORTA Ao Root diam: 3.00 cm Ao Asc diam:  2.60 cm MITRAL VALVE               TRICUSPID VALVE MV Area (PHT): 2.42 cm    TR Peak grad:   16.3 mmHg MV Decel Time: 313 msec    TR Vmax:        202.00 cm/s MV E velocity: 94.70 cm/s MV A velocity: 90.10 cm/s  SHUNTS MV E/A ratio:  1.05        Systemic VTI:  0.18 m                            Systemic Diam: 2.10 cm Lennie Odor MD Electronically signed by Lennie Odor MD Signature Date/Time: 12/09/2022/6:09:25 PM    Final    DG Forearm Right  Result Date: 12/08/2022 CLINICAL DATA:  Fall with forearm pain EXAM: RIGHT FOREARM - 2 VIEW COMPARISON:  None Available. FINDINGS: No acute fracture or dislocation. No elbow joint effusion. No significant soft tissue swelling or radiopaque foreign object. IMPRESSION: No acute osseous abnormality. Electronically Signed   By: Jeronimo Greaves M.D.   On: 12/08/2022 11:39   DG Chest Port 1 View  Result Date:  12/08/2022 CLINICAL DATA:  89 year old male status post fall. EXAM: PORTABLE CHEST 1 VIEW COMPARISON:  Cervical spine CT 1004 hours today. Chest radiographs 07/24/2022. FINDINGS: Portable AP view at 1013 hours. Ongoing blunting of both lung bases suggesting chronic pleural effusions. Lung base ventilation appears mildly improved from January. No superimposed pneumothorax, pulmonary edema, air bronchograms. Normal cardiac size and mediastinal contours. Visualized tracheal air column is within normal limits. No acute osseous abnormality identified. Negative  visible bowel gas. IMPRESSION: 1. Bilateral pleural effusions persist since January with mildly improved lung base ventilation since that time. 2. No acute traumatic injury identified. Electronically Signed   By: Odessa Fleming M.D.   On: 12/08/2022 10:21   CT CERVICAL SPINE WO CONTRAST  Result Date: 12/08/2022 CLINICAL DATA:  Polytrauma, blunt.  Fall. EXAM: CT CERVICAL SPINE WITHOUT CONTRAST TECHNIQUE: Multidetector CT imaging of the cervical spine was performed without intravenous contrast. Multiplanar CT image reconstructions were also generated. RADIATION DOSE REDUCTION: This exam was performed according to the departmental dose-optimization program which includes automated exposure control, adjustment of the mA and/or kV according to patient size and/or use of iterative reconstruction technique. COMPARISON:  06/25/2022 FINDINGS: Alignment: No subluxation. Skull base and vertebrae: No acute fracture. No primary bone lesion or focal pathologic process. Soft tissues and spinal canal: No prevertebral fluid or swelling. No visible canal hematoma. Disc levels: Moderate to advanced diffuse degenerative disc disease with disc space narrowing and spurring, and bilateral degenerative facet disease. Upper chest: No acute findings. Other: None IMPRESSION: Diffuse cervical spondylosis.  No acute bony abnormality. Electronically Signed   By: Charlett Nose M.D.   On: 12/08/2022  10:12   CT HEAD WO CONTRAST  Result Date: 12/08/2022 CLINICAL DATA:  Head trauma, moderate-severe.  Fall. EXAM: CT HEAD WITHOUT CONTRAST TECHNIQUE: Contiguous axial images were obtained from the base of the skull through the vertex without intravenous contrast. RADIATION DOSE REDUCTION: This exam was performed according to the departmental dose-optimization program which includes automated exposure control, adjustment of the mA and/or kV according to patient size and/or use of iterative reconstruction technique. COMPARISON:  06/25/2022 FINDINGS: Brain: There is atrophy and chronic small vessel disease changes. No acute intracranial abnormality. Specifically, no hemorrhage, hydrocephalus, mass lesion, acute infarction, or significant intracranial injury. Vascular: No hyperdense vessel or unexpected calcification. Skull: No acute calvarial abnormality. Sinuses/Orbits: No acute findings. Mucosal thickening throughout the paranasal sinuses. Mastoid air cells clear. Other: Soft tissue swelling in the left posterior parietal scalp. IMPRESSION: Atrophy, chronic microvascular disease. No acute intracranial abnormality. Electronically Signed   By: Charlett Nose M.D.   On: 12/08/2022 10:10   (Echo, Carotid, EGD, Colonoscopy, ERCP)    Subjective: Patient seen and examined.  Pleasant.  Unable to express any concerns.   Discharge Exam: Vitals:   12/13/22 0757 12/13/22 0946  BP: (!) 120/58 (!) 109/52  Pulse: 75   Resp: (!) 21   Temp: (!) 97.5 F (36.4 C)   SpO2: 96%    Vitals:   12/12/22 2201 12/13/22 0419 12/13/22 0757 12/13/22 0946  BP: 105/68 (!) 110/52 (!) 120/58 (!) 109/52  Pulse: 71 67 75   Resp: 20 18 (!) 21   Temp: 99.1 F (37.3 C) 97.6 F (36.4 C) (!) 97.5 F (36.4 C)   TempSrc: Oral Axillary Oral   SpO2:  97% 96%   Weight:  54.8 kg    Height:       General: Chronically sick looking, frail and debilitated gentleman laying in bed. Dressing on the posterior scalp, intact and  dry. Cardiovascular: S1-S2 normal.  Regular rate rhythm. Respiratory: Bilateral clear. Gastrointestinal: Soft.  Nontender. Ext: No swelling or edema. Neuro: Patient is alert and awake but not oriented.  Mumbles something but incomprehensible speech.  Moves all extremities.  Very hard of hearing.     The results of significant diagnostics from this hospitalization (including imaging, microbiology, ancillary and laboratory) are listed below for reference.     Microbiology: No  results found for this or any previous visit (from the past 240 hour(s)).   Labs: BNP (last 3 results) Recent Labs    12/08/22 0920  BNP 36.4   Basic Metabolic Panel: Recent Labs  Lab 12/08/22 0920 12/08/22 1135 12/08/22 1559 12/08/22 2145 12/09/22 0220 12/10/22 0711  NA 134*  --   --   --  135 144  K 2.6*  --   --  3.7 3.3* 4.4  CL 96*  --   --   --  103 114*  CO2 25  --   --   --  24 24  GLUCOSE 107*  --   --   --  106* 100*  BUN 51*  --   --   --  34* 24*  CREATININE 1.48*  --   --   --  1.15 0.99  CALCIUM 8.6*  --   --   --  8.3* 8.1*  MG  --  2.4  --   --   --   --   PHOS  --   --  3.4  --   --   --    Liver Function Tests: Recent Labs  Lab 12/08/22 0920 12/10/22 0711  AST 23 17  ALT 13 12  ALKPHOS 53 45  BILITOT 0.5 0.4  PROT 6.5 5.6*  ALBUMIN 3.3* 2.6*   No results for input(s): "LIPASE", "AMYLASE" in the last 168 hours. No results for input(s): "AMMONIA" in the last 168 hours. CBC: Recent Labs  Lab 12/08/22 0920 12/10/22 0711  WBC 10.6* 9.1  NEUTROABS 5.8  --   HGB 12.7* 11.5*  HCT 38.0* 35.8*  MCV 99.5 100.3*  PLT 334 314   Cardiac Enzymes: No results for input(s): "CKTOTAL", "CKMB", "CKMBINDEX", "TROPONINI" in the last 168 hours. BNP: Invalid input(s): "POCBNP" CBG: Recent Labs  Lab 12/09/22 2105 12/10/22 0538 12/11/22 0536 12/12/22 0515 12/13/22 0515  GLUCAP 101* 98 115* 99 102*   D-Dimer No results for input(s): "DDIMER" in the last 72 hours. Hgb  A1c No results for input(s): "HGBA1C" in the last 72 hours. Lipid Profile No results for input(s): "CHOL", "HDL", "LDLCALC", "TRIG", "CHOLHDL", "LDLDIRECT" in the last 72 hours. Thyroid function studies No results for input(s): "TSH", "T4TOTAL", "T3FREE", "THYROIDAB" in the last 72 hours.  Invalid input(s): "FREET3" Anemia work up No results for input(s): "VITAMINB12", "FOLATE", "FERRITIN", "TIBC", "IRON", "RETICCTPCT" in the last 72 hours. Urinalysis    Component Value Date/Time   COLORURINE YELLOW 12/08/2022 0932   APPEARANCEUR CLEAR 12/08/2022 0932   LABSPEC 1.010 12/08/2022 0932   PHURINE 6.0 12/08/2022 0932   GLUCOSEU NEGATIVE 12/08/2022 0932   HGBUR NEGATIVE 12/08/2022 0932   BILIRUBINUR NEGATIVE 12/08/2022 0932   KETONESUR NEGATIVE 12/08/2022 0932   PROTEINUR NEGATIVE 12/08/2022 0932   NITRITE NEGATIVE 12/08/2022 0932   LEUKOCYTESUR NEGATIVE 12/08/2022 0932   Sepsis Labs Recent Labs  Lab 12/08/22 0920 12/10/22 0711  WBC 10.6* 9.1   Microbiology No results found for this or any previous visit (from the past 240 hour(s)).   Time coordinating discharge: 32 minutes  SIGNED:   Dorcas Carrow, MD  [facility] 12/13/2022, 10:46 AM

## 2022-12-13 NOTE — Progress Notes (Signed)
Report called to [facility]. Spoke to [employer].

## 2022-12-14 ENCOUNTER — Non-Acute Institutional Stay (SKILLED_NURSING_FACILITY): Payer: Medicare Other | Admitting: Nurse Practitioner

## 2022-12-14 ENCOUNTER — Encounter: Payer: Self-pay | Admitting: Nurse Practitioner

## 2022-12-14 DIAGNOSIS — S0003XD Contusion of scalp, subsequent encounter: Secondary | ICD-10-CM

## 2022-12-14 DIAGNOSIS — R55 Syncope and collapse: Secondary | ICD-10-CM

## 2022-12-14 DIAGNOSIS — E039 Hypothyroidism, unspecified: Secondary | ICD-10-CM

## 2022-12-14 DIAGNOSIS — N1831 Chronic kidney disease, stage 3a: Secondary | ICD-10-CM

## 2022-12-14 DIAGNOSIS — I5022 Chronic systolic (congestive) heart failure: Secondary | ICD-10-CM | POA: Diagnosis not present

## 2022-12-14 DIAGNOSIS — Z8673 Personal history of transient ischemic attack (TIA), and cerebral infarction without residual deficits: Secondary | ICD-10-CM

## 2022-12-14 DIAGNOSIS — E785 Hyperlipidemia, unspecified: Secondary | ICD-10-CM | POA: Diagnosis not present

## 2022-12-14 DIAGNOSIS — K219 Gastro-esophageal reflux disease without esophagitis: Secondary | ICD-10-CM | POA: Diagnosis not present

## 2022-12-14 DIAGNOSIS — M1A9XX0 Chronic gout, unspecified, without tophus (tophi): Secondary | ICD-10-CM

## 2022-12-14 DIAGNOSIS — R059 Cough, unspecified: Secondary | ICD-10-CM

## 2022-12-14 DIAGNOSIS — F339 Major depressive disorder, recurrent, unspecified: Secondary | ICD-10-CM | POA: Diagnosis not present

## 2022-12-14 DIAGNOSIS — R4189 Other symptoms and signs involving cognitive functions and awareness: Secondary | ICD-10-CM

## 2022-12-14 DIAGNOSIS — M1990 Unspecified osteoarthritis, unspecified site: Secondary | ICD-10-CM

## 2022-12-14 DIAGNOSIS — E876 Hypokalemia: Secondary | ICD-10-CM | POA: Diagnosis not present

## 2022-12-14 DIAGNOSIS — R350 Frequency of micturition: Secondary | ICD-10-CM

## 2022-12-14 DIAGNOSIS — I5020 Unspecified systolic (congestive) heart failure: Secondary | ICD-10-CM | POA: Insufficient documentation

## 2022-12-14 DIAGNOSIS — S0003XA Contusion of scalp, initial encounter: Secondary | ICD-10-CM | POA: Insufficient documentation

## 2022-12-14 NOTE — Assessment & Plan Note (Signed)
taking Allopurinol

## 2022-12-14 NOTE — Assessment & Plan Note (Signed)
on Doxazosin

## 2022-12-14 NOTE — Assessment & Plan Note (Signed)
2/2 to hypotension related to multiple diuretics/antihypertensive agents, autonomic dysfunction.

## 2022-12-14 NOTE — Assessment & Plan Note (Signed)
Kcl, K 4.4 12/10/22

## 2022-12-14 NOTE — Assessment & Plan Note (Signed)
on Simvastatin 

## 2022-12-14 NOTE — Assessment & Plan Note (Signed)
Hx of

## 2022-12-14 NOTE — Assessment & Plan Note (Signed)
n Levothyroxine, TSH 2.36 11/23/22

## 2022-12-14 NOTE — Assessment & Plan Note (Addendum)
DNR, MOST comfort measures IVF/ABT for trial period. Obtain MMSE, on Donepezil, Memantine

## 2022-12-14 NOTE — Assessment & Plan Note (Signed)
Bun/creat 24/0.99 12/10/22

## 2022-12-14 NOTE — Progress Notes (Signed)
Location:  [facility] Room Number: NO/61/A Place of Service:  SNF (31) Provider:  Kazoua Gossen X, NP Stuart Gammon, MD  Patient Care Team: Stuart Gammon, MD as PCP - General (Internal Medicine) Bensimhon, Stuart Buckles, MD as PCP - Advanced Heart Failure (Cardiology) Stuart Housekeeper, MD as Consulting Physician (Internal Medicine)  Extended Emergency Contact Information Primary Emergency Contact: Stuart Watson,Stuart Watson: [phone] Relation: Son Secondary Emergency Contact: Stuart Watson,Stuart Watson: [phone] Relation: Son  Code Status:  DNR Goals of care: Advanced Directive information    12/14/2022   10:26 AM  Advanced Directives  Does Patient Have a Medical Advance Directive? Yes  Type of Estate agent of [place];Living will;Out of facility DNR (pink MOST or yellow form)  Does patient want to make changes to medical advance directive? No - Patient declined  Copy of Healthcare Power of Attorney in Chart? Yes - validated most recent copy scanned in chart (See row information)  Pre-existing out of facility DNR order (yellow form or pink MOST form) Pink MOST form placed in chart (order not valid for inpatient use);Yellow form placed in chart (order not valid for inpatient use)     Chief Complaint  Patient presents with   Follow-up    Patient is here for follow up after hospital stay     HPI:  Pt is a 89 y.o. male seen today for an acute visit for medication review following hospital stay   Hospitalized 12/08/22-12/13/22 for syncope work up, fell backward while brushing teeth, resulted a hematoma back of head-staples removal 12/17/22,  2/2 to hypotension, multiple factorials: multiple diuretics/antihypertensive agents, autonomic dysfunction. Resolved after fluid resuscitation.   Cough associated with swallowing, pending ST  CKD Bun/creat 24/0.99 12/10/22  Syncope 2/2 to hypotension related to multiple diuretics/antihypertensive  agents, autonomic dysfunction.   Hypokalemia, Kcl, K 4.4 12/10/22  Dementia, on Donepezil, Memantine  TIA, Hx of  Systolic CHF, minimal edema BLE, cough, rhonchi, on Torsemide 20mg  qd now(was Torsemide 60mg  bid, off Spironolactone bid), EF 60-65%, CXR 12/08/22 no acute process.   GERD, taking Pantoprazole, Hgb 11.5 12/10/22  HLD, on Simvastatin  Gout, taking Allopurinol  Urinary frequency, on Doxazosin  Depression, on Escitalopram  Hypothyroidism, on Levothyroxine, TSH 2.36 11/23/22  OA, uses walker, w/c to go further, taking Tylenol   Past Medical History:  Diagnosis Date   Actinic keratoses    Arthritis    CHF (congestive heart failure) (HCC)    Chronic bilateral pleural effusions    Chronic sinusitis    Colon polyps    Dyspnea 07/13/2013   BNP normal   Edema 07/13/2013   GERD (gastroesophageal reflux disease)    omeprazole 1-2 times daily   H/O TIA (transient ischemic attack) and stroke    on statins and plavix -saw Dr.Sethi in the past   Headache    History of BPH    History of TIA (transient ischemic attack) 07/13/2013   Left inguinal hernia    Left inguinal hernia 01/27/2019   Lumbar radiculopathy    Need for shingles vaccine    Optic neuritis 10/2015   left eye   Perennial allergic rhinitis    Polymyalgia rheumatica (HCC) 07/13/2013   Daily prednisone   Restless leg syndrome    Retinal disease    right eye since birth    Shingles    Temporal arteritis (HCC) 10/2015   Wears glasses    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   Past Surgical History:  Procedure Laterality  Date   BRAVO [facility] STUDY N/A 04/18/2016   Procedure: BRAVO PH STUDY;  Surgeon: Charlott Rakes, MD;  Location: [facility] ENDOSCOPY;  Service: Endoscopy;  Laterality: N/A;   CHEST TUBE INSERTION Right 02/13/2018   Procedure: INSERTION PLEURAL DRAINAGE CATHETER;  Surgeon: Kerin Perna, MD;  Location: [facility] OR;  Service: Thoracic;  Laterality: Right;   CHEST TUBE INSERTION Left 11/27/2019   Procedure: REDO INSERTION  PLEURAL DRAINAGE CATHETER USING 15.5 FR. PLEURX CATH;  Surgeon: Donata Clay, Theron Arista, MD;  Location: [facility] OR;  Service: Thoracic;  Laterality: Left;   COLONOSCOPY     drropy eyelid surgery     ESOPHAGOGASTRODUODENOSCOPY (EGD) WITH PROPOFOL N/A 04/18/2016   Procedure: ESOPHAGOGASTRODUODENOSCOPY (EGD) WITH PROPOFOL;  Surgeon: Charlott Rakes, MD;  Location: [facility] ENDOSCOPY;  Service: Endoscopy;  Laterality: N/A;   INGUINAL HERNIA REPAIR Left 01/27/2019   Procedure: OPEN REPAIR LEFT INGUINAL HERNIA WITH MESH;  Surgeon: Claud Kelp, MD;  Location: [facility] OR;  Service: General;  Laterality: Left;  GENERAL AND TAP BLOCK ANESTHESIA   INSERTION OF MESH Left 01/27/2019   Procedure: Insertion Of Mesh;  Surgeon: Claud Kelp, MD;  Location: [facility] OR;  Service: General;  Laterality: Left;   IR THORACENTESIS ASP PLEURAL SPACE W/IMG GUIDE  02/04/2018   IR THORACENTESIS ASP PLEURAL SPACE W/IMG GUIDE  02/06/2018   REMOVAL OF PLEURAL DRAINAGE CATHETER Left 11/27/2019   Procedure: REMOVAL OF PLEURAL DRAINAGE CATHETER;  Surgeon: Kerin Perna, MD;  Location: [facility] OR;  Service: Thoracic;  Laterality: Left;   REMOVAL OF PLEURAL DRAINAGE CATHETER Left 03/31/2020   Procedure: REMOVAL OF PLEURAL DRAINAGE CATHETER;  Surgeon: Kerin Perna, MD;  Location: [facility] OR;  Service: Thoracic;  Laterality: Left;   RIGHT/LEFT HEART CATH AND CORONARY ANGIOGRAPHY N/A 06/24/2018   Procedure: RIGHT/LEFT HEART CATH AND CORONARY ANGIOGRAPHY;  Surgeon: Dolores Patty, MD;  Location: [facility] INVASIVE CV LAB;  Service: Cardiovascular;  Laterality: N/A;   VASECTOMY      No Known Allergies  Outpatient Encounter Medications as of 12/14/2022  Medication Sig   acetaminophen (TYLENOL) 500 MG tablet Take 2 tablets (1,000 mg total) by mouth in the morning and at bedtime.   allopurinol (ZYLOPRIM) 300 MG tablet Take 150 mg by mouth in the morning. For gout   aspirin 81 MG chewable tablet Chew 81 mg by mouth in the morning. Chew and swallow tablet   donepezil  (ARICEPT ODT) 5 MG disintegrating tablet Take 1 tablet (5 mg total) by mouth at bedtime. For co++ + (Patient taking differently: Take 5 mg by mouth at bedtime.)   doxazosin (CARDURA) 4 MG tablet Take 4 mg by mouth daily.   escitalopram (LEXAPRO) 10 MG tablet Take 1 tablet (10 mg total) by mouth daily.   levothyroxine (SYNTHROID) 25 MCG tablet Take 25 mcg by mouth daily before breakfast.   memantine (NAMENDA) 10 MG tablet Take 1 tablet (10 mg total) by mouth 2 (two) times daily.   pantoprazole (PROTONIX) 20 MG tablet Take 20 mg by mouth daily before breakfast.   potassium chloride SA (KLOR-CON M) 20 MEQ tablet Take 40 mEq by mouth daily.   simvastatin (ZOCOR) 20 MG tablet Take 20 mg by mouth every evening.   torsemide (DEMADEX) 20 MG tablet Take 1 tablet (20 mg total) by mouth daily. Give 3 tablet by mouth   No facility-administered encounter medications on file as of 12/14/2022.    Review of Systems  Unable to perform ROS: Dementia    Immunization History  Administered Date(s)  Administered   Fluad Quad(high Dose 65+) 04/27/2021, 05/16/2022   Influenza, High Dose Seasonal PF 04/15/2017, 05/16/2018   Influenza,inj,Quad PF,6+ Mos 04/23/2015   Moderna SARS-COV2 Booster Vaccination 12/28/2020, 04/20/2021   Moderna Sars-Covid-2 Vaccination 07/26/2019, 08/26/2019, 06/06/2020   Pneumococcal Conjugate-13 02/14/2016, 07/23/2016   Pneumococcal Polysaccharide-23 11/13/2012   Td 01/29/2006   Tdap 08/07/2018   Zoster Recombinat (Shingrix) 02/18/2019   Zoster, Live 02/18/2019, 07/01/2019   Pertinent  Health Maintenance Due  Topic Date Due   INFLUENZA VACCINE  02/21/2023      03/19/2022    1:03 PM 05/10/2022    3:46 PM 06/25/2022    2:44 AM 08/08/2022   10:15 AM 12/14/2022   10:26 AM  Fall Risk  Falls in the past year? 1 1  0 1  Was there an injury with Fall? 0 0  0 1  Fall Risk Category Calculator 2 2  0 3  Fall Risk Category (Retired) Moderate Moderate     (RETIRED) Patient Fall Risk  Level Moderate fall risk Moderate fall risk High fall risk    Patient at Risk for Falls Due to History of fall(s);Impaired balance/gait;Impaired mobility History of fall(s);Impaired balance/gait;Impaired mobility  No Fall Risks History of fall(s);Impaired balance/gait;Impaired mobility;Impaired vision  Fall risk Follow up Falls evaluation completed;Falls prevention discussed;Education provided Falls evaluation completed  Falls evaluation completed Falls prevention discussed;Falls evaluation completed   Functional Status Survey:    Vitals:   12/14/22 1025  BP: (!) 112/58  Pulse: (!) 59  Resp: 16  Temp: 97.6 F (36.4 C)  SpO2: 93%  Weight: 120 lb (54.4 kg)  Height: 5\' 9"  (1.753 m)   Body mass index is 17.72 kg/m. Physical Exam Vitals and nursing note reviewed.  Constitutional:      Comments: weak  HENT:     Head: Normocephalic.     Comments: Back of head staples.     Nose: Nose normal.     Mouth/Throat:     Mouth: Mucous membranes are moist.  Eyes:     Extraocular Movements: Extraocular movements intact.     Conjunctiva/sclera: Conjunctivae normal.     Pupils: Pupils are equal, round, and reactive to light.  Cardiovascular:     Rate and Rhythm: Normal rate and regular rhythm.     Heart sounds: Murmur heard.  Pulmonary:     Effort: Pulmonary effort is normal.     Breath sounds: Rhonchi present. No wheezing or rales.     Comments: diffused Abdominal:     General: Bowel sounds are normal.     Palpations: Abdomen is soft.     Tenderness: There is no abdominal tenderness.     Hernia: A hernia is present.     Comments: Hx of left inguinal hernia.   Musculoskeletal:     Cervical back: Normal range of motion and neck supple.     Right lower leg: Edema present.     Left lower leg: Edema present.     Comments: 1+ edema BLE  Skin:    General: Skin is warm and dry.     Comments: Staples scalp back of head, no active bleeding or s/s of infection.   Neurological:     General:  No focal deficit present.     Mental Status: He is alert. Mental status is at baseline.     Motor: No weakness.     Coordination: Coordination normal.     Gait: Gait abnormal.     Comments: Oriented to person, place.  Psychiatric:        Mood and Affect: Mood normal.        Behavior: Behavior normal.     Labs reviewed: Recent Labs    12/08/22 0920 12/08/22 1135 12/08/22 1559 12/08/22 2145 12/09/22 0220 12/10/22 0711  NA 134*  --   --   --  135 144  K 2.6*  --   --  3.7 3.3* 4.4  CL 96*  --   --   --  103 114*  CO2 25  --   --   --  24 24  GLUCOSE 107*  --   --   --  106* 100*  BUN 51*  --   --   --  34* 24*  CREATININE 1.48*  --   --   --  1.15 0.99  CALCIUM 8.6*  --   --   --  8.3* 8.1*  MG  --  2.4  --   --   --   --   PHOS  --   --  3.4  --   --   --    Recent Labs    11/23/22 0000 12/08/22 0920 12/10/22 0711  AST 15 23 17   ALT 11 13 12   ALKPHOS 59 53 45  BILITOT  --  0.5 0.4  PROT  --  6.5 5.6*  ALBUMIN 3.9 3.3* 2.6*   Recent Labs    06/25/22 1330 11/23/22 0000 12/08/22 0920 12/10/22 0711  WBC 10.4 8.8 10.6* 9.1  NEUTROABS 7,124.00 4,409.00 5.8  --   HGB 12.5* 13.1* 12.7* 11.5*  HCT 37* 40* 38.0* 35.8*  MCV  --   --  99.5 100.3*  PLT 403* 334 334 314   Lab Results  Component Value Date   TSH 2.36 11/23/2022   No results found for: "HGBA1C" Lab Results  Component Value Date   CHOL 121 11/23/2022   HDL 48 11/23/2022   LDLCALC 50 11/23/2022   TRIG 144 11/23/2022    Significant Diagnostic Results in last 30 days:  DG Abd 1 View  Result Date: 12/11/2022 CLINICAL DATA:  Abdominal pain for a few days EXAM: ABDOMEN - 1 VIEW COMPARISON:  Watson-ray 12/11/2022 FINDINGS: Scattered colonic stool. Overall moderate stool burden. Gas is seen in nondilated loops of small and large bowel. There is air along the rectum. Stomach is mildly distended. No obvious free air on these supine radiographs. Note is made of bilateral pleural effusions and adjacent lung  opacities. Please correlate with the Watson-ray of the chest from 12/08/2022. Scattered vascular calcifications. Osteopenia and degenerative changes. IMPRESSION: Moderate colonic stool.  Nonspecific bowel gas pattern Electronically Signed   By: Karen Kays M.D.   On: 12/11/2022 16:19   ECHOCARDIOGRAM COMPLETE  Result Date: 12/09/2022    ECHOCARDIOGRAM REPORT   Patient Name:   Stuart Watson Date of Exam: 12/09/2022 Medical Rec #:  [id number]      Height:       69.0 in Accession #:    [id number]     Weight:       129.9 lb Date of Birth:  February 12, 1932      BSA:          1.720 m Patient Age:    89 years       BP:           122/52 mmHg Patient Gender: M              HR:  66 bpm. Exam Location:  Inpatient Procedure: 2D Echo, Color Doppler and Cardiac Doppler Indications:    syncope  History:        Patient has prior history of Echocardiogram examinations, most                 recent 03/13/2021. Chronic kidney disease; Risk                 Factors:Dyslipidemia.  Sonographer:    Delcie Roch RDCS Referring Phys: [id number] Emeline General  Sonographer Comments: Image acquisition challenging due to uncooperative patient and dementia. IMPRESSIONS  1. Left ventricular ejection fraction, by estimation, is 60 to 65%. The left ventricle has normal function. The left ventricle has no regional wall motion abnormalities. Left ventricular diastolic parameters are consistent with Grade I diastolic dysfunction (impaired relaxation).  2. Right ventricular systolic function is normal. The right ventricular size is normal. There is normal pulmonary artery systolic pressure. The estimated right ventricular systolic pressure is 19.3 mmHg.  3. The mitral valve is grossly normal. Mild mitral valve regurgitation. No evidence of mitral stenosis.  4. The aortic valve is tricuspid. There is moderate calcification of the aortic valve. There is moderate thickening of the aortic valve. Aortic valve regurgitation is not visualized. Mild aortic  valve stenosis. Aortic valve mean gradient measures 10.7 mmHg. Aortic valve Vmax measures 2.22 m/s.  5. The inferior vena cava is normal in size with greater than 50% respiratory variability, suggesting right atrial pressure of 3 mmHg. Comparison(s): No significant change from prior study. FINDINGS  Left Ventricle: Left ventricular ejection fraction, by estimation, is 60 to 65%. The left ventricle has normal function. The left ventricle has no regional wall motion abnormalities. The left ventricular internal cavity size was normal in size. There is  no left ventricular hypertrophy. Left ventricular diastolic parameters are consistent with Grade I diastolic dysfunction (impaired relaxation). Right Ventricle: The right ventricular size is normal. No increase in right ventricular wall thickness. Right ventricular systolic function is normal. There is normal pulmonary artery systolic pressure. The tricuspid regurgitant velocity is 2.02 m/s, and  with an assumed right atrial pressure of 3 mmHg, the estimated right ventricular systolic pressure is 19.3 mmHg. Left Atrium: Left atrial size was normal in size. Right Atrium: Right atrial size was normal in size. Pericardium: Trivial pericardial effusion is present. Mitral Valve: The mitral valve is grossly normal. Mild mitral valve regurgitation. No evidence of mitral valve stenosis. Tricuspid Valve: The tricuspid valve is grossly normal. Tricuspid valve regurgitation is trivial. No evidence of tricuspid stenosis. Aortic Valve: The aortic valve is tricuspid. There is moderate calcification of the aortic valve. There is moderate thickening of the aortic valve. Aortic valve regurgitation is not visualized. Mild aortic stenosis is present. Aortic valve mean gradient measures 10.7 mmHg. Aortic valve peak gradient measures 19.8 mmHg. Aortic valve area, by VTI measures 1.39 cm. Pulmonic Valve: The pulmonic valve was grossly normal. Pulmonic valve regurgitation is mild. No evidence  of pulmonic stenosis. Aorta: The aortic root and ascending aorta are structurally normal, with no evidence of dilitation. Venous: The inferior vena cava is normal in size with greater than 50% respiratory variability, suggesting right atrial pressure of 3 mmHg. IAS/Shunts: The atrial septum is grossly normal.  LEFT VENTRICLE PLAX 2D LVIDd:         3.80 cm   Diastology LVIDs:         2.40 cm   LV e' medial:    8.70 cm/s LV  PW:         0.90 cm   LV E/e' medial:  10.9 LV IVS:        0.90 cm   LV e' lateral:   7.83 cm/s LVOT diam:     2.10 cm   LV E/e' lateral: 12.1 LV SV:         64 LV SV Index:   37 LVOT Area:     3.46 cm  RIGHT VENTRICLE             IVC RV S prime:     15.10 cm/s  IVC diam: 1.30 cm TAPSE (M-mode): 2.3 cm LEFT ATRIUM             Index        RIGHT ATRIUM           Index LA diam:        3.00 cm 1.74 cm/m   RA Area:     15.30 cm LA Vol (A2C):   36.4 ml 21.17 ml/m  RA Volume:   39.60 ml  23.03 ml/m LA Vol (A4C):   51.5 ml 29.95 ml/m LA Biplane Vol: 47.7 ml 27.74 ml/m  AORTIC VALVE AV Area (Vmax):    1.27 cm AV Area (Vmean):   1.21 cm AV Area (VTI):     1.39 cm AV Vmax:           222.33 cm/s AV Vmean:          149.000 cm/s AV VTI:            0.460 m AV Peak Grad:      19.8 mmHg AV Mean Grad:      10.7 mmHg LVOT Vmax:         81.30 cm/s LVOT Vmean:        52.100 cm/s LVOT VTI:          0.184 m LVOT/AV VTI ratio: 0.40  AORTA Ao Root diam: 3.00 cm Ao Asc diam:  2.60 cm MITRAL VALVE               TRICUSPID VALVE MV Area (PHT): 2.42 cm    TR Peak grad:   16.3 mmHg MV Decel Time: 313 msec    TR Vmax:        202.00 cm/s MV E velocity: 94.70 cm/s MV A velocity: 90.10 cm/s  SHUNTS MV E/A ratio:  1.05        Systemic VTI:  0.18 m                            Systemic Diam: 2.10 cm Lennie Odor MD Electronically signed by Lennie Odor MD Signature Date/Time: 12/09/2022/6:09:25 PM    Final    DG Forearm Right  Result Date: 12/08/2022 CLINICAL DATA:  Fall with forearm pain EXAM: RIGHT FOREARM - 2 VIEW  COMPARISON:  None Available. FINDINGS: No acute fracture or dislocation. No elbow joint effusion. No significant soft tissue swelling or radiopaque foreign object. IMPRESSION: No acute osseous abnormality. Electronically Signed   By: Jeronimo Greaves M.D.   On: 12/08/2022 11:39   DG Chest Port 1 View  Result Date: 12/08/2022 CLINICAL DATA:  89 year old male status post fall. EXAM: PORTABLE CHEST 1 VIEW COMPARISON:  Cervical spine CT 1004 hours today. Chest radiographs 07/24/2022. FINDINGS: Portable AP view at 1013 hours. Ongoing blunting of both lung bases suggesting chronic pleural effusions. Lung base ventilation appears mildly improved from  January. No superimposed pneumothorax, pulmonary edema, air bronchograms. Normal cardiac size and mediastinal contours. Visualized tracheal air column is within normal limits. No acute osseous abnormality identified. Negative visible bowel gas. IMPRESSION: 1. Bilateral pleural effusions persist since January with mildly improved lung base ventilation since that time. 2. No acute traumatic injury identified. Electronically Signed   By: Odessa Fleming M.D.   On: 12/08/2022 10:21   CT CERVICAL SPINE WO CONTRAST  Result Date: 12/08/2022 CLINICAL DATA:  Polytrauma, blunt.  Fall. EXAM: CT CERVICAL SPINE WITHOUT CONTRAST TECHNIQUE: Multidetector CT imaging of the cervical spine was performed without intravenous contrast. Multiplanar CT image reconstructions were also generated. RADIATION DOSE REDUCTION: This exam was performed according to the departmental dose-optimization program which includes automated exposure control, adjustment of the mA and/or kV according to patient size and/or use of iterative reconstruction technique. COMPARISON:  06/25/2022 FINDINGS: Alignment: No subluxation. Skull base and vertebrae: No acute fracture. No primary bone lesion or focal pathologic process. Soft tissues and spinal canal: No prevertebral fluid or swelling. No visible canal hematoma. Disc  levels: Moderate to advanced diffuse degenerative disc disease with disc space narrowing and spurring, and bilateral degenerative facet disease. Upper chest: No acute findings. Other: None IMPRESSION: Diffuse cervical spondylosis.  No acute bony abnormality. Electronically Signed   By: Charlett Nose M.D.   On: 12/08/2022 10:12   CT HEAD WO CONTRAST  Result Date: 12/08/2022 CLINICAL DATA:  Head trauma, moderate-severe.  Fall. EXAM: CT HEAD WITHOUT CONTRAST TECHNIQUE: Contiguous axial images were obtained from the base of the skull through the vertex without intravenous contrast. RADIATION DOSE REDUCTION: This exam was performed according to the departmental dose-optimization program which includes automated exposure control, adjustment of the mA and/or kV according to patient size and/or use of iterative reconstruction technique. COMPARISON:  06/25/2022 FINDINGS: Brain: There is atrophy and chronic small vessel disease changes. No acute intracranial abnormality. Specifically, no hemorrhage, hydrocephalus, mass lesion, acute infarction, or significant intracranial injury. Vascular: No hyperdense vessel or unexpected calcification. Skull: No acute calvarial abnormality. Sinuses/Orbits: No acute findings. Mucosal thickening throughout the paranasal sinuses. Mastoid air cells clear. Other: Soft tissue swelling in the left posterior parietal scalp. IMPRESSION: Atrophy, chronic microvascular disease. No acute intracranial abnormality. Electronically Signed   By: Charlett Nose M.D.   On: 12/08/2022 10:10    Assessment/Plan Cognitive impairment DNR, MOST comfort measures IVF/ABT for trial period. Obtain MMSE, on Donepezil, Memantine  Systolic CHF (HCC) minimal edema BLE, cough, rhonchi, on Torsemide 20mg  qd now(was Torsemide 60mg  bid, off Spironolactone bid), EF 60-65%, CXR 12/08/22 no acute process.  Obtain CXR to evaluate further, DuoNeb q8hr x72hrs, CBC/BMP one week.   Syncope and collapse  2/2 to hypotension  related to multiple diuretics/antihypertensive agents, autonomic dysfunction.   Scalp hematoma fell backward while brushing teeth, resulted a hematoma back of head-staples removal 12/17/22  Cough in adult associated with swallowing, pending ST 12/14/22 CXR left base infiltrate, staff reported the patient's cough got better, observe  CKD (chronic kidney disease) stage 3, GFR 30-59 ml/min (HCC) Bun/creat 24/0.99 12/10/22  Hypokalemia  Kcl, K 4.4 12/10/22  History of TIA (transient ischemic attack) Hx of  Esophageal reflux taking Pantoprazole, Hgb 11.5 12/10/22  HLD (hyperlipidemia)  on Simvastatin  Gout  taking Allopurinol  Recurrent depression (HCC) on Escitalopram  Urinary frequency  on Doxazosin  Hypothyroidism n Levothyroxine, TSH 2.36 11/23/22  Arthritis, degenerative uses walker, w/c to go further, taking Tylenol     Family/ staff Communication: plan of care  reviewed with the patient, the patient's son, and charge nurse.   Labs/tests ordered: CBC, BMP 12/20/22, CXR ap/lateral  Time spend 35 minutes.

## 2022-12-14 NOTE — Assessment & Plan Note (Signed)
associated with swallowing, pending ST 12/14/22 CXR left base infiltrate, staff reported the patient's cough got better, observe

## 2022-12-14 NOTE — Assessment & Plan Note (Signed)
uses walker, w/c to go further, taking Tylenol

## 2022-12-14 NOTE — Assessment & Plan Note (Signed)
taking Pantoprazole, Hgb 11.5 12/10/22

## 2022-12-14 NOTE — Assessment & Plan Note (Signed)
fell backward while brushing teeth, resulted a hematoma back of head-staples removal 12/17/22

## 2022-12-14 NOTE — Assessment & Plan Note (Signed)
on Escitalopram  

## 2022-12-14 NOTE — Assessment & Plan Note (Signed)
minimal edema BLE, cough, rhonchi, on Torsemide 20mg  qd now(was Torsemide 60mg  bid, off Spironolactone bid), EF 60-65%, CXR 12/08/22 no acute process.  Obtain CXR to evaluate further, DuoNeb q8hr x72hrs, CBC/BMP one week.

## 2022-12-17 ENCOUNTER — Other Ambulatory Visit (HOSPITAL_COMMUNITY): Payer: Self-pay | Admitting: Internal Medicine

## 2022-12-18 ENCOUNTER — Non-Acute Institutional Stay (SKILLED_NURSING_FACILITY): Payer: Medicare Other | Admitting: Family Medicine

## 2022-12-18 DIAGNOSIS — N1831 Chronic kidney disease, stage 3a: Secondary | ICD-10-CM

## 2022-12-18 DIAGNOSIS — I700 Atherosclerosis of aorta: Secondary | ICD-10-CM

## 2022-12-18 DIAGNOSIS — R4189 Other symptoms and signs involving cognitive functions and awareness: Secondary | ICD-10-CM | POA: Diagnosis not present

## 2022-12-18 DIAGNOSIS — R55 Syncope and collapse: Secondary | ICD-10-CM

## 2022-12-18 DIAGNOSIS — E039 Hypothyroidism, unspecified: Secondary | ICD-10-CM | POA: Diagnosis not present

## 2022-12-18 NOTE — Progress Notes (Signed)
Provider:  Jacalyn Lefevre, MD Location:      Place of Service:     PCP: Mahlon Gammon, MD Patient Care Team: Mahlon Gammon, MD as PCP - General (Internal Medicine) Gala Romney, Bevelyn Buckles, MD as PCP - Advanced Heart Failure (Cardiology) Georgann Housekeeper, MD as Consulting Physician (Internal Medicine)  Extended Emergency Contact Information Primary Emergency Contact: Vanostrand,[name] Mobile Phone: [phone] Relation: Son Secondary Emergency Contact: Gault,[name] Mobile Phone: [phone] Relation: Son  Code Status:  Goals of Care: Advanced Directive information    12/14/2022   10:26 AM  Advanced Directives  Does Patient Have a Medical Advance Directive? Yes  Type of Estate agent of [place];Living will;Out of facility DNR (pink MOST or yellow form)  Does patient want to make changes to medical advance directive? No - Patient declined  Copy of Healthcare Power of Attorney in Chart? Yes - validated most recent copy scanned in chart (See row information)  Pre-existing out of facility DNR order (yellow form or pink MOST form) Pink MOST form placed in chart (order not valid for inpatient use);Yellow form placed in chart (order not valid for inpatient use)      No chief complaint on file.   HPI: Patient is a 89 y.o. male seen today for admission to [facility].  He had been living in an assisted living at friend's home when asked, but on the day of admission, apparently suffered a syncopal episode in which she fell backwards striking the back of his head suffering hematoma as well as scalp laceration.  He was taken to the emergency room for further evaluation.  Historically he had been becoming more unsteady on his feet recently and had been on fairly rigorous doses of torsemide to 60 mg a day as well as Aldactone and Zaroxolyn.  There was a significant orthostatic drop in blood pressure noted in the emergency room and he was admitted to the  hospital.  He was resuscitated with gentle IV fluids, and discharged here for therapy. He has advanced dementia and is on Aricept and Namenda.  There is also a history of chronic kidney disease stage IIIb and chronic systolic heart failure that had been treated with aggressive diuretics.  Past Medical History:  Diagnosis Date   Actinic keratoses    Arthritis    CHF (congestive heart failure) (HCC)    Chronic bilateral pleural effusions    Chronic sinusitis    Colon polyps    Dyspnea 07/13/2013   BNP normal   Edema 07/13/2013   GERD (gastroesophageal reflux disease)    omeprazole 1-2 times daily   H/O TIA (transient ischemic attack) and stroke    on statins and plavix -saw Dr.Sethi in the past   Headache    History of BPH    History of TIA (transient ischemic attack) 07/13/2013   Left inguinal hernia    Left inguinal hernia 01/27/2019   Lumbar radiculopathy    Need for shingles vaccine    Optic neuritis 10/2015   left eye   Perennial allergic rhinitis    Polymyalgia rheumatica (HCC) 07/13/2013   Daily prednisone   Restless leg syndrome    Retinal disease    right eye since birth    Shingles    Temporal arteritis (HCC) 10/2015   Wears glasses    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   Past Surgical History:  Procedure Laterality Date   BRAVO [facility] STUDY N/A 04/18/2016   Procedure: BRAVO PH STUDY;  Surgeon: Oswaldo Done  Bosie Clos, MD;  Location: [facility];  Service: Endoscopy;  Laterality: N/A;   CHEST TUBE INSERTION Right 02/13/2018   Procedure: INSERTION PLEURAL DRAINAGE CATHETER;  Surgeon: Kerin Perna, MD;  Location: [facility] OR;  Service: Thoracic;  Laterality: Right;   CHEST TUBE INSERTION Left 11/27/2019   Procedure: REDO INSERTION PLEURAL DRAINAGE CATHETER USING 15.5 FR. PLEURX CATH;  Surgeon: Donata Clay, Theron Arista, MD;  Location: [facility] OR;  Service: Thoracic;  Laterality: Left;   COLONOSCOPY     drropy eyelid surgery     ESOPHAGOGASTRODUODENOSCOPY (EGD) WITH PROPOFOL N/A 04/18/2016    Procedure: ESOPHAGOGASTRODUODENOSCOPY (EGD) WITH PROPOFOL;  Surgeon: Charlott Rakes, MD;  Location: [facility] ENDOSCOPY;  Service: Endoscopy;  Laterality: N/A;   INGUINAL HERNIA REPAIR Left 01/27/2019   Procedure: OPEN REPAIR LEFT INGUINAL HERNIA WITH MESH;  Surgeon: Claud Kelp, MD;  Location: [facility] OR;  Service: General;  Laterality: Left;  GENERAL AND TAP BLOCK ANESTHESIA   INSERTION OF MESH Left 01/27/2019   Procedure: Insertion Of Mesh;  Surgeon: Claud Kelp, MD;  Location: [facility] OR;  Service: General;  Laterality: Left;   IR THORACENTESIS ASP PLEURAL SPACE W/IMG GUIDE  02/04/2018   IR THORACENTESIS ASP PLEURAL SPACE W/IMG GUIDE  02/06/2018   REMOVAL OF PLEURAL DRAINAGE CATHETER Left 11/27/2019   Procedure: REMOVAL OF PLEURAL DRAINAGE CATHETER;  Surgeon: Kerin Perna, MD;  Location: [facility] OR;  Service: Thoracic;  Laterality: Left;   REMOVAL OF PLEURAL DRAINAGE CATHETER Left 03/31/2020   Procedure: REMOVAL OF PLEURAL DRAINAGE CATHETER;  Surgeon: Kerin Perna, MD;  Location: [facility] OR;  Service: Thoracic;  Laterality: Left;   RIGHT/LEFT HEART CATH AND CORONARY ANGIOGRAPHY N/A 06/24/2018   Procedure: RIGHT/LEFT HEART CATH AND CORONARY ANGIOGRAPHY;  Surgeon: Dolores Patty, MD;  Location: [facility] INVASIVE CV LAB;  Service: Cardiovascular;  Laterality: N/A;   VASECTOMY      reports that he quit smoking about 47 years ago. His smoking use included cigarettes. He has a 15.00 pack-year smoking history. He has never used smokeless tobacco. He reports current alcohol use of about 7.0 standard drinks of alcohol per week. He reports that he does not use drugs. Social History   Socioeconomic History   Marital status: Widowed    Spouse name: Not on file   Number of children: 2   Years of education: Not on file   Highest education level: Not on file  Occupational History   Occupation: retired  Tobacco Use   Smoking status: Former    Packs/day: 1.00    Years: 15.00    Additional pack years: 0.00    Total pack  years: 15.00    Types: Cigarettes    Quit date: 07/23/1975    Years since quitting: 47.4   Smokeless tobacco: Never  Vaping Use   Vaping Use: Never used  Substance and Sexual Activity   Alcohol use: Yes    Alcohol/week: 7.0 standard drinks of alcohol    Types: 7 Glasses of wine per week    Comment: 1 glass red wine daily   Drug use: No   Sexual activity: Not on file  Other Topics Concern   Not on file  Social History Narrative   Not on file   Social Determinants of Health   Financial Resource Strain: Low Risk  (03/19/2022)   Overall Financial Resource Strain (CARDIA)    Difficulty of Paying Living Expenses: Not hard at all  Food Insecurity: No Food Insecurity (03/19/2022)   Hunger Vital Sign    Worried  About Running Out of Food in the Last Year: Never true    Ran Out of Food in the Last Year: Never true  Transportation Needs: No Transportation Needs (03/19/2022)   PRAPARE - Administrator, Civil Service (Medical): No    Lack of Transportation (Non-Medical): No  Physical Activity: Insufficiently Active (03/19/2022)   Exercise Vital Sign    Days of Exercise per Week: 7 days    Minutes of Exercise per Session: 10 min  Stress: No Stress Concern Present (03/19/2022)   [employer] of Occupational Health - Occupational Stress Questionnaire    Feeling of Stress : Not at all  Social Connections: Socially Isolated (03/19/2022)   Social Connection and Isolation Panel [NHANES]    Frequency of Communication with Friends and Family: Three times a week    Frequency of Social Gatherings with Friends and Family: More than three times a week    Attends Religious Services: Never    Database administrator or Organizations: No    Attends Banker Meetings: Never    Marital Status: Widowed  Intimate Partner Violence: Not At Risk (03/19/2022)   Humiliation, Afraid, Rape, and Kick questionnaire    Fear of Current or Ex-Partner: No    Emotionally Abused: No     Physically Abused: No    Sexually Abused: No    Functional Status Survey:    Family History  Problem Relation Age of Onset   Stroke Mother    Coronary artery disease Mother    Diabetes Mother    Breast cancer Mother    Heart attack Father    Coronary artery disease Father    Diabetes Father    Luiz Blare' disease Sister     Health Maintenance  Topic Date Due   COVID-19 Vaccine (6 - 2023-24 season) 03/23/2022   Zoster Vaccines- Shingrix (2 of 2) 01/24/2023 (Originally 04/15/2019)   INFLUENZA VACCINE  02/21/2023   Medicare Annual Wellness (AWV)  03/20/2023   DTaP/Tdap/Td (3 - Td or Tdap) 08/07/2028   Pneumonia Vaccine 1+ Years old  Completed   HPV VACCINES  Aged Out    No Known Allergies  Outpatient Encounter Medications as of 12/18/2022  Medication Sig   acetaminophen (TYLENOL) 500 MG tablet Take 2 tablets (1,000 mg total) by mouth in the morning and at bedtime.   allopurinol (ZYLOPRIM) 300 MG tablet Take 150 mg by mouth in the morning. For gout   aspirin 81 MG chewable tablet Chew 81 mg by mouth in the morning. Chew and swallow tablet   donepezil (ARICEPT ODT) 5 MG disintegrating tablet Take 1 tablet (5 mg total) by mouth at bedtime. For co++ + (Patient taking differently: Take 5 mg by mouth at bedtime.)   doxazosin (CARDURA) 4 MG tablet Take 4 mg by mouth daily.   escitalopram (LEXAPRO) 10 MG tablet Take 1 tablet (10 mg total) by mouth daily.   levothyroxine (SYNTHROID) 25 MCG tablet Take 25 mcg by mouth daily before breakfast.   memantine (NAMENDA) 10 MG tablet Take 1 tablet (10 mg total) by mouth 2 (two) times daily.   pantoprazole (PROTONIX) 20 MG tablet Take 20 mg by mouth daily before breakfast.   potassium chloride SA (KLOR-CON M) 20 MEQ tablet Take 40 mEq by mouth daily.   simvastatin (ZOCOR) 20 MG tablet Take 20 mg by mouth every evening.   torsemide (DEMADEX) 20 MG tablet Take 1 tablet (20 mg total) by mouth daily. Give 3 tablet by mouth  No facility-administered  encounter medications on file as of 12/18/2022.    Review of Systems  Unable to perform ROS: Dementia    There were no vitals filed for this visit. There is no height or weight on file to calculate BMI. Physical Exam Vitals and nursing note reviewed.  Constitutional:      Appearance: Normal appearance.  Eyes:     Extraocular Movements: Extraocular movements intact.     Conjunctiva/sclera: Conjunctivae normal.     Pupils: Pupils are equal, round, and reactive to light.  Cardiovascular:     Rate and Rhythm: Normal rate and regular rhythm.  Pulmonary:     Effort: Pulmonary effort is normal.     Breath sounds: Normal breath sounds.  Abdominal:     General: Bowel sounds are normal.     Palpations: Abdomen is soft.  Neurological:     General: No focal deficit present.     Mental Status: He is alert.     Comments: Oriented to name only  Psychiatric:        Mood and Affect: Mood normal.        Behavior: Behavior normal.     Labs reviewed: Basic Metabolic Panel: Recent Labs    12/08/22 0920 12/08/22 1135 12/08/22 1559 12/08/22 2145 12/09/22 0220 12/10/22 0711  NA 134*  --   --   --  135 144  K 2.6*  --   --  3.7 3.3* 4.4  CL 96*  --   --   --  103 114*  CO2 25  --   --   --  24 24  GLUCOSE 107*  --   --   --  106* 100*  BUN 51*  --   --   --  34* 24*  CREATININE 1.48*  --   --   --  1.15 0.99  CALCIUM 8.6*  --   --   --  8.3* 8.1*  MG  --  2.4  --   --   --   --   PHOS  --   --  3.4  --   --   --    Liver Function Tests: Recent Labs    11/23/22 0000 12/08/22 0920 12/10/22 0711  AST 15 23 17   ALT 11 13 12   ALKPHOS 59 53 45  BILITOT  --  0.5 0.4  PROT  --  6.5 5.6*  ALBUMIN 3.9 3.3* 2.6*   No results for input(s): "LIPASE", "AMYLASE" in the last 8760 hours. No results for input(s): "AMMONIA" in the last 8760 hours. CBC: Recent Labs    06/25/22 1330 11/23/22 0000 12/08/22 0920 12/10/22 0711  WBC 10.4 8.8 10.6* 9.1  NEUTROABS 7,124.00 4,409.00 5.8  --    HGB 12.5* 13.1* 12.7* 11.5*  HCT 37* 40* 38.0* 35.8*  MCV  --   --  99.5 100.3*  PLT 403* 334 334 314   Cardiac Enzymes: No results for input(s): "CKTOTAL", "CKMB", "CKMBINDEX", "TROPONINI" in the last 8760 hours. BNP: Invalid input(s): "POCBNP" No results found for: "HGBA1C" Lab Results  Component Value Date   TSH 2.36 11/23/2022   No results found for: "VITAMINB12" No results found for: "FOLATE" No results found for: "IRON", "TIBC", "FERRITIN"  Imaging and Procedures obtained prior to SNF admission: ECHOCARDIOGRAM COMPLETE  Result Date: 12/09/2022    ECHOCARDIOGRAM REPORT   Patient Name:   URYAH CLENDANIEL Date of Exam: 12/09/2022 Medical Rec #:  [id number]      Height:  69.0 in Accession #:    [id number]     Weight:       129.9 lb Date of Birth:  29-Mar-1932      BSA:          1.720 m Patient Age:    89 years       BP:           122/52 mmHg Patient Gender: M              HR:           66 bpm. Exam Location:  Inpatient Procedure: 2D Echo, Color Doppler and Cardiac Doppler Indications:    syncope  History:        Patient has prior history of Echocardiogram examinations, most                 recent 03/13/2021. Chronic kidney disease; Risk                 Factors:Dyslipidemia.  Sonographer:    Delcie Roch RDCS Referring Phys: [id number] [place] General  Sonographer Comments: Image acquisition challenging due to uncooperative patient and dementia. IMPRESSIONS  1. Left ventricular ejection fraction, by estimation, is 60 to 65%. The left ventricle has normal function. The left ventricle has no regional wall motion abnormalities. Left ventricular diastolic parameters are consistent with Grade I diastolic dysfunction (impaired relaxation).  2. Right ventricular systolic function is normal. The right ventricular size is normal. There is normal pulmonary artery systolic pressure. The estimated right ventricular systolic pressure is 19.3 mmHg.  3. The mitral valve is grossly normal. Mild mitral valve  regurgitation. No evidence of mitral stenosis.  4. The aortic valve is tricuspid. There is moderate calcification of the aortic valve. There is moderate thickening of the aortic valve. Aortic valve regurgitation is not visualized. Mild aortic valve stenosis. Aortic valve mean gradient measures 10.7 mmHg. Aortic valve Vmax measures 2.22 m/s.  5. The inferior vena cava is normal in size with greater than 50% respiratory variability, suggesting right atrial pressure of 3 mmHg. Comparison(s): No significant change from prior study. FINDINGS  Left Ventricle: Left ventricular ejection fraction, by estimation, is 60 to 65%. The left ventricle has normal function. The left ventricle has no regional wall motion abnormalities. The left ventricular internal cavity size was normal in size. There is  no left ventricular hypertrophy. Left ventricular diastolic parameters are consistent with Grade I diastolic dysfunction (impaired relaxation). Right Ventricle: The right ventricular size is normal. No increase in right ventricular wall thickness. Right ventricular systolic function is normal. There is normal pulmonary artery systolic pressure. The tricuspid regurgitant velocity is 2.02 m/s, and  with an assumed right atrial pressure of 3 mmHg, the estimated right ventricular systolic pressure is 19.3 mmHg. Left Atrium: Left atrial size was normal in size. Right Atrium: Right atrial size was normal in size. Pericardium: Trivial pericardial effusion is present. Mitral Valve: The mitral valve is grossly normal. Mild mitral valve regurgitation. No evidence of mitral valve stenosis. Tricuspid Valve: The tricuspid valve is grossly normal. Tricuspid valve regurgitation is trivial. No evidence of tricuspid stenosis. Aortic Valve: The aortic valve is tricuspid. There is moderate calcification of the aortic valve. There is moderate thickening of the aortic valve. Aortic valve regurgitation is not visualized. Mild aortic stenosis is present.  Aortic valve mean gradient measures 10.7 mmHg. Aortic valve peak gradient measures 19.8 mmHg. Aortic valve area, by VTI measures 1.39 cm. Pulmonic Valve: The pulmonic valve was grossly  normal. Pulmonic valve regurgitation is mild. No evidence of pulmonic stenosis. Aorta: The aortic root and ascending aorta are structurally normal, with no evidence of dilitation. Venous: The inferior vena cava is normal in size with greater than 50% respiratory variability, suggesting right atrial pressure of 3 mmHg. IAS/Shunts: The atrial septum is grossly normal.  LEFT VENTRICLE PLAX 2D LVIDd:         3.80 cm   Diastology LVIDs:         2.40 cm   LV e' medial:    8.70 cm/s LV PW:         0.90 cm   LV E/e' medial:  10.9 LV IVS:        0.90 cm   LV e' lateral:   7.83 cm/s LVOT diam:     2.10 cm   LV E/e' lateral: 12.1 LV SV:         64 LV SV Index:   37 LVOT Area:     3.46 cm  RIGHT VENTRICLE             IVC RV S prime:     15.10 cm/s  IVC diam: 1.30 cm TAPSE (M-mode): 2.3 cm LEFT ATRIUM             Index        RIGHT ATRIUM           Index LA diam:        3.00 cm 1.74 cm/m   RA Area:     15.30 cm LA Vol (A2C):   36.4 ml 21.17 ml/m  RA Volume:   39.60 ml  23.03 ml/m LA Vol (A4C):   51.5 ml 29.95 ml/m LA Biplane Vol: 47.7 ml 27.74 ml/m  AORTIC VALVE AV Area (Vmax):    1.27 cm AV Area (Vmean):   1.21 cm AV Area (VTI):     1.39 cm AV Vmax:           222.33 cm/s AV Vmean:          149.000 cm/s AV VTI:            0.460 m AV Peak Grad:      19.8 mmHg AV Mean Grad:      10.7 mmHg LVOT Vmax:         81.30 cm/s LVOT Vmean:        52.100 cm/s LVOT VTI:          0.184 m LVOT/AV VTI ratio: 0.40  AORTA Ao Root diam: 3.00 cm Ao Asc diam:  2.60 cm MITRAL VALVE               TRICUSPID VALVE MV Area (PHT): 2.42 cm    TR Peak grad:   16.3 mmHg MV Decel Time: 313 msec    TR Vmax:        202.00 cm/s MV E velocity: 94.70 cm/s MV A velocity: 90.10 cm/s  SHUNTS MV E/A ratio:  1.05        Systemic VTI:  0.18 m                            Systemic  Diam: 2.10 cm Lennie Odor MD Electronically signed by Lennie Odor MD Signature Date/Time: 12/09/2022/6:09:25 PM    Final    DG Forearm Right  Result Date: 12/08/2022 CLINICAL DATA:  Fall with forearm pain EXAM: RIGHT FOREARM - 2 VIEW COMPARISON:  None Available. FINDINGS:  No acute fracture or dislocation. No elbow joint effusion. No significant soft tissue swelling or radiopaque foreign object. IMPRESSION: No acute osseous abnormality. Electronically Signed   By: Jeronimo Greaves M.D.   On: 12/08/2022 11:39   DG Chest Port 1 View  Result Date: 12/08/2022 CLINICAL DATA:  89 year old male status post fall. EXAM: PORTABLE CHEST 1 VIEW COMPARISON:  Cervical spine CT 1004 hours today. Chest radiographs 07/24/2022. FINDINGS: Portable AP view at 1013 hours. Ongoing blunting of both lung bases suggesting chronic pleural effusions. Lung base ventilation appears mildly improved from January. No superimposed pneumothorax, pulmonary edema, air bronchograms. Normal cardiac size and mediastinal contours. Visualized tracheal air column is within normal limits. No acute osseous abnormality identified. Negative visible bowel gas. IMPRESSION: 1. Bilateral pleural effusions persist since January with mildly improved lung base ventilation since that time. 2. No acute traumatic injury identified. Electronically Signed   By: Odessa Fleming M.D.   On: 12/08/2022 10:21   CT CERVICAL SPINE WO CONTRAST  Result Date: 12/08/2022 CLINICAL DATA:  Polytrauma, blunt.  Fall. EXAM: CT CERVICAL SPINE WITHOUT CONTRAST TECHNIQUE: Multidetector CT imaging of the cervical spine was performed without intravenous contrast. Multiplanar CT image reconstructions were also generated. RADIATION DOSE REDUCTION: This exam was performed according to the departmental dose-optimization program which includes automated exposure control, adjustment of the mA and/or kV according to patient size and/or use of iterative reconstruction technique. COMPARISON:   06/25/2022 FINDINGS: Alignment: No subluxation. Skull base and vertebrae: No acute fracture. No primary bone lesion or focal pathologic process. Soft tissues and spinal canal: No prevertebral fluid or swelling. No visible canal hematoma. Disc levels: Moderate to advanced diffuse degenerative disc disease with disc space narrowing and spurring, and bilateral degenerative facet disease. Upper chest: No acute findings. Other: None IMPRESSION: Diffuse cervical spondylosis.  No acute bony abnormality. Electronically Signed   By: Charlett Nose M.D.   On: 12/08/2022 10:12   CT HEAD WO CONTRAST  Result Date: 12/08/2022 CLINICAL DATA:  Head trauma, moderate-severe.  Fall. EXAM: CT HEAD WITHOUT CONTRAST TECHNIQUE: Contiguous axial images were obtained from the base of the skull through the vertex without intravenous contrast. RADIATION DOSE REDUCTION: This exam was performed according to the departmental dose-optimization program which includes automated exposure control, adjustment of the mA and/or kV according to patient size and/or use of iterative reconstruction technique. COMPARISON:  06/25/2022 FINDINGS: Brain: There is atrophy and chronic small vessel disease changes. No acute intracranial abnormality. Specifically, no hemorrhage, hydrocephalus, mass lesion, acute infarction, or significant intracranial injury. Vascular: No hyperdense vessel or unexpected calcification. Skull: No acute calvarial abnormality. Sinuses/Orbits: No acute findings. Mucosal thickening throughout the paranasal sinuses. Mastoid air cells clear. Other: Soft tissue swelling in the left posterior parietal scalp. IMPRESSION: Atrophy, chronic microvascular disease. No acute intracranial abnormality. Electronically Signed   By: Charlett Nose M.D.   On: 12/08/2022 10:10    Assessment/Plan 1. Aortic atherosclerosis (HCC) Patient treated for blood pressure as well as lipids  2. Stage 3a chronic kidney disease (HCC) Kidney function improved  significantly with IV fluids now stage IIIa  3. Cognitive impairment Fairly severe dementia on Aricept as well as Namenda  4. Hypothyroidism, unspecified type  Continues with low-dose levothyroxine 25 mcg 5. Syncope and collapse Patient resuscitated with gentle IV fluids in hospital no further syncope    Family/ staff Communication:   Labs/tests ordered:  Bertram Millard. Hyacinth Meeker, MD [facility] [address] [place], [place] [address] Office [phone]

## 2022-12-20 ENCOUNTER — Encounter: Payer: Self-pay | Admitting: Nurse Practitioner

## 2022-12-20 DIAGNOSIS — F0390 Unspecified dementia without behavioral disturbance: Secondary | ICD-10-CM | POA: Insufficient documentation

## 2022-12-21 ENCOUNTER — Encounter: Payer: Self-pay | Admitting: Nurse Practitioner

## 2022-12-21 ENCOUNTER — Non-Acute Institutional Stay (SKILLED_NURSING_FACILITY): Payer: Medicare Other | Admitting: Nurse Practitioner

## 2022-12-21 DIAGNOSIS — E039 Hypothyroidism, unspecified: Secondary | ICD-10-CM | POA: Diagnosis not present

## 2022-12-21 DIAGNOSIS — I5022 Chronic systolic (congestive) heart failure: Secondary | ICD-10-CM | POA: Diagnosis not present

## 2022-12-21 DIAGNOSIS — F339 Major depressive disorder, recurrent, unspecified: Secondary | ICD-10-CM

## 2022-12-21 DIAGNOSIS — K219 Gastro-esophageal reflux disease without esophagitis: Secondary | ICD-10-CM

## 2022-12-21 DIAGNOSIS — E876 Hypokalemia: Secondary | ICD-10-CM

## 2022-12-21 DIAGNOSIS — N1831 Chronic kidney disease, stage 3a: Secondary | ICD-10-CM | POA: Diagnosis not present

## 2022-12-21 NOTE — Assessment & Plan Note (Signed)
taking Pantoprazole, Hgb 11.3 12/20/22

## 2022-12-21 NOTE — Assessment & Plan Note (Signed)
Kcl, K 4.0 12/20/22

## 2022-12-21 NOTE — Assessment & Plan Note (Signed)
Bun/creat 24/0.87 12/20/22

## 2022-12-21 NOTE — Progress Notes (Signed)
Location:   SNF [facility] Room Number: 40 Place of Service:  SNF (31) Provider: Arna Snipe Edenilson Austad NP  Mahlon Gammon, MD  Patient Care Team: Mahlon Gammon, MD as PCP - General (Internal Medicine) Bensimhon, Bevelyn Buckles, MD as PCP - Advanced Heart Failure (Cardiology) Georgann Housekeeper, MD as Consulting Physician (Internal Medicine)  Extended Emergency Contact Information Primary Emergency Contact: Watson,Stuart Mobile Phone: [phone] Relation: Son Secondary Emergency Contact: Watson,Stuart Mobile Phone: [phone] Relation: Son  Code Status: DNR Goals of care: Advanced Directive information    12/14/2022   10:26 AM  Advanced Directives  Does Patient Have a Medical Advance Directive? Yes  Type of Estate agent of [place];Living will;Out of facility DNR (pink MOST or yellow form)  Does patient want to make changes to medical advance directive? No - Patient declined  Copy of Healthcare Power of Attorney in Chart? Yes - validated most recent copy scanned in chart (See row information)  Pre-existing out of facility DNR order (yellow form or pink MOST form) Pink MOST form placed in chart (order not valid for inpatient use);Yellow form placed in chart (order not valid for inpatient use)     Chief Complaint  Patient presents with   Acute Visit    Swelling BLE    HPI:  Pt is a 89 y.o. male seen today for an acute visit for increased edema BLE, moist cough, no O2 desaturation, afebrile.     Hospitalized 12/08/22-12/13/22 for syncope work up, fell backward while brushing teeth, resulted a hematoma back of head-staples removal 12/17/22,  2/2 to hypotension, multiple factorials: multiple diuretics/antihypertensive agents, autonomic dysfunction. Resolved after fluid resuscitation.              Cough associated with swallowing             CKD Bun/creat 24/0.87 12/20/22             Syncope 2/2 to hypotension related to multiple diuretics/antihypertensive agents,  autonomic dysfunction.              Hypokalemia, Kcl, K 4.0 12/20/22             Dementia, on Donepezil, Memantine             TIA, Hx of             Systolic CHF, moist cough, increased edema BLE, cough, rhonchi, on Torsemide 20mg  qd now(was Torsemide 60mg  bid, off Spironolactone bid), EF 60-65%, CXR 12/08/22 no acute process.              GERD, taking Pantoprazole, Hgb 11.3 12/20/22             HLD, on Simvastatin             Gout, taking Allopurinol             Urinary frequency, on Doxazosin             Depression, on Escitalopram             Hypothyroidism, on Levothyroxine, TSH 2.36 11/23/22             OA, uses walker, w/c to go further, taking Tylenol  Past Medical History:  Diagnosis Date   Actinic keratoses    Arthritis    CHF (congestive heart failure) (HCC)    Chronic bilateral pleural effusions    Chronic sinusitis    Colon polyps    Dyspnea 07/13/2013   BNP normal   Edema  07/13/2013   GERD (gastroesophageal reflux disease)    omeprazole 1-2 times daily   H/O TIA (transient ischemic attack) and stroke    on statins and plavix -saw Dr.Sethi in the past   Headache    History of BPH    History of TIA (transient ischemic attack) 07/13/2013   Left inguinal hernia    Left inguinal hernia 01/27/2019   Lumbar radiculopathy    Need for shingles vaccine    Optic neuritis 10/2015   left eye   Perennial allergic rhinitis    Polymyalgia rheumatica (HCC) 07/13/2013   Daily prednisone   Restless leg syndrome    Retinal disease    right eye since birth    Shingles    Temporal arteritis (HCC) 10/2015   Wears glasses    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   Past Surgical History:  Procedure Laterality Date   BRAVO PH STUDY N/A 04/18/2016   Procedure: BRAVO PH STUDY;  Surgeon: Charlott Rakes, MD;  Location: [facility];  Service: Endoscopy;  Laterality: N/A;   CHEST TUBE INSERTION Right 02/13/2018   Procedure: INSERTION PLEURAL DRAINAGE CATHETER;  Surgeon: Kerin Perna, MD;   Location: [facility] OR;  Service: Thoracic;  Laterality: Right;   CHEST TUBE INSERTION Left 11/27/2019   Procedure: REDO INSERTION PLEURAL DRAINAGE CATHETER USING 15.5 FR. PLEURX CATH;  Surgeon: Donata Clay, Theron Arista, MD;  Location: [facility] OR;  Service: Thoracic;  Laterality: Left;   COLONOSCOPY     drropy eyelid surgery     ESOPHAGOGASTRODUODENOSCOPY (EGD) WITH PROPOFOL N/A 04/18/2016   Procedure: ESOPHAGOGASTRODUODENOSCOPY (EGD) WITH PROPOFOL;  Surgeon: Charlott Rakes, MD;  Location: [facility];  Service: Endoscopy;  Laterality: N/A;   INGUINAL HERNIA REPAIR Left 01/27/2019   Procedure: OPEN REPAIR LEFT INGUINAL HERNIA WITH MESH;  Surgeon: Claud Kelp, MD;  Location: [facility] OR;  Service: General;  Laterality: Left;  GENERAL AND TAP BLOCK ANESTHESIA   INSERTION OF MESH Left 01/27/2019   Procedure: Insertion Of Mesh;  Surgeon: Claud Kelp, MD;  Location: [facility] OR;  Service: General;  Laterality: Left;   IR THORACENTESIS ASP PLEURAL SPACE W/IMG GUIDE  02/04/2018   IR THORACENTESIS ASP PLEURAL SPACE W/IMG GUIDE  02/06/2018   REMOVAL OF PLEURAL DRAINAGE CATHETER Left 11/27/2019   Procedure: REMOVAL OF PLEURAL DRAINAGE CATHETER;  Surgeon: Kerin Perna, MD;  Location: [facility] OR;  Service: Thoracic;  Laterality: Left;   REMOVAL OF PLEURAL DRAINAGE CATHETER Left 03/31/2020   Procedure: REMOVAL OF PLEURAL DRAINAGE CATHETER;  Surgeon: Kerin Perna, MD;  Location: [facility] OR;  Service: Thoracic;  Laterality: Left;   RIGHT/LEFT HEART CATH AND CORONARY ANGIOGRAPHY N/A 06/24/2018   Procedure: RIGHT/LEFT HEART CATH AND CORONARY ANGIOGRAPHY;  Surgeon: Dolores Patty, MD;  Location: [facility];  Service: Cardiovascular;  Laterality: N/A;   VASECTOMY      No Known Allergies  Allergies as of 12/21/2022   No Known Allergies      Medication List        Accurate as of Dec 21, 2022  1:46 PM. If you have any questions, ask your nurse or doctor.          acetaminophen 500 MG tablet Commonly known as: TYLENOL Take  2 tablets (1,000 mg total) by mouth in the morning and at bedtime.   allopurinol 300 MG tablet Commonly known as: ZYLOPRIM Take 150 mg by mouth in the morning. For gout   aspirin 81 MG chewable tablet Chew 81 mg by mouth in the morning.  Chew and swallow tablet   donepezil 5 MG disintegrating tablet Commonly known as: ARICEPT ODT Take 1 tablet (5 mg total) by mouth at bedtime. For co++ + What changed: additional instructions   doxazosin 4 MG tablet Commonly known as: CARDURA Take 4 mg by mouth daily.   escitalopram 10 MG tablet Commonly known as: LEXAPRO Take 1 tablet (10 mg total) by mouth daily.   levothyroxine 25 MCG tablet Commonly known as: SYNTHROID Take 25 mcg by mouth daily before breakfast.   memantine 10 MG tablet Commonly known as: NAMENDA Take 1 tablet (10 mg total) by mouth 2 (two) times daily.   pantoprazole 20 MG tablet Commonly known as: PROTONIX Take 20 mg by mouth daily before breakfast.   potassium chloride SA 20 MEQ tablet Commonly known as: KLOR-CON M Take 40 mEq by mouth daily.   simvastatin 20 MG tablet Commonly known as: ZOCOR Take 20 mg by mouth every evening.   torsemide 20 MG tablet Commonly known as: DEMADEX Take 1 tablet (20 mg total) by mouth daily. Give 3 tablet by mouth        Review of Systems  Unable to perform ROS: Dementia    Immunization History  Administered Date(s) Administered   Fluad Quad(high Dose 65+) 04/27/2021, 05/16/2022   Influenza, High Dose Seasonal PF 04/15/2017, 05/16/2018   Influenza,inj,Quad PF,6+ Mos 04/23/2015   Moderna SARS-COV2 Booster Vaccination 12/28/2020, 04/20/2021   Moderna Sars-Covid-2 Vaccination 07/26/2019, 08/26/2019, 06/06/2020   Pneumococcal Conjugate-13 02/14/2016, 07/23/2016   Pneumococcal Polysaccharide-23 11/13/2012   Td 01/29/2006   Tdap 08/07/2018   Zoster Recombinat (Shingrix) 02/18/2019   Zoster, Live 02/18/2019, 07/01/2019   Pertinent  Health Maintenance Due  Topic Date  Due   INFLUENZA VACCINE  02/21/2023      03/19/2022    1:03 PM 05/10/2022    3:46 PM 06/25/2022    2:44 AM 08/08/2022   10:15 AM 12/14/2022   10:26 AM  Fall Risk  Falls in the past year? 1 1  0 1  Was there an injury with Fall? 0 0  0 1  Fall Risk Category Calculator 2 2  0 3  Fall Risk Category (Retired) Moderate Moderate     (RETIRED) Patient Fall Risk Level Moderate fall risk Moderate fall risk High fall risk    Patient at Risk for Falls Due to History of fall(s);Impaired balance/gait;Impaired mobility History of fall(s);Impaired balance/gait;Impaired mobility  No Fall Risks History of fall(s);Impaired balance/gait;Impaired mobility;Impaired vision  Fall risk Follow up Falls evaluation completed;Falls prevention discussed;Education provided Falls evaluation completed  Falls evaluation completed Falls prevention discussed;Falls evaluation completed   Functional Status Survey:    Vitals:   12/21/22 1331  BP: 102/60  Pulse: 66  Resp: 19  Temp: 97.8 F (36.6 C)  SpO2: 98%   There is no height or weight on file to calculate BMI. Physical Exam Vitals and nursing note reviewed.  Constitutional:      Comments: weak  HENT:     Head: Normocephalic.     Comments: Back of head staples.     Nose: Nose normal.     Mouth/Throat:     Mouth: Mucous membranes are moist.  Eyes:     Extraocular Movements: Extraocular movements intact.     Conjunctiva/sclera: Conjunctivae normal.     Pupils: Pupils are equal, round, and reactive to light.  Cardiovascular:     Rate and Rhythm: Normal rate and regular rhythm.     Heart sounds: Murmur heard.  Pulmonary:  Effort: Pulmonary effort is normal.     Breath sounds: No wheezing, rhonchi or rales.  Abdominal:     General: Bowel sounds are normal.     Palpations: Abdomen is soft.     Tenderness: There is no abdominal tenderness.     Hernia: A hernia is present.     Comments: Hx of left inguinal hernia.   Musculoskeletal:     Cervical back:  Normal range of motion and neck supple.     Right lower leg: Edema present.     Left lower leg: Edema present.     Comments: 2-3+ edema BLE  Skin:    General: Skin is warm and dry.     Comments: Staples scalp back of head, no active bleeding or s/s of infection.   Neurological:     General: No focal deficit present.     Mental Status: He is alert. Mental status is at baseline.     Motor: No weakness.     Coordination: Coordination normal.     Gait: Gait abnormal.     Comments: Oriented to person, place.   Psychiatric:        Mood and Affect: Mood normal.        Behavior: Behavior normal.     Labs reviewed: Recent Labs    12/08/22 0920 12/08/22 1135 12/08/22 1559 12/08/22 2145 12/09/22 0220 12/10/22 0711  NA 134*  --   --   --  135 144  K 2.6*  --   --  3.7 3.3* 4.4  CL 96*  --   --   --  103 114*  CO2 25  --   --   --  24 24  GLUCOSE 107*  --   --   --  106* 100*  BUN 51*  --   --   --  34* 24*  CREATININE 1.48*  --   --   --  1.15 0.99  CALCIUM 8.6*  --   --   --  8.3* 8.1*  MG  --  2.4  --   --   --   --   PHOS  --   --  3.4  --   --   --    Recent Labs    11/23/22 0000 12/08/22 0920 12/10/22 0711  AST 15 23 17   ALT 11 13 12   ALKPHOS 59 53 45  BILITOT  --  0.5 0.4  PROT  --  6.5 5.6*  ALBUMIN 3.9 3.3* 2.6*   Recent Labs    06/25/22 1330 11/23/22 0000 12/08/22 0920 12/10/22 0711  WBC 10.4 8.8 10.6* 9.1  NEUTROABS 7,124.00 4,409.00 5.8  --   HGB 12.5* 13.1* 12.7* 11.5*  HCT 37* 40* 38.0* 35.8*  MCV  --   --  99.5 100.3*  PLT 403* 334 334 314   Lab Results  Component Value Date   TSH 2.36 11/23/2022   No results found for: "HGBA1C" Lab Results  Component Value Date   CHOL 121 11/23/2022   HDL 48 11/23/2022   LDLCALC 50 11/23/2022   TRIG 144 11/23/2022    Significant Diagnostic Results in last 30 days:  DG Abd 1 View  Result Date: 12/11/2022 CLINICAL DATA:  Abdominal pain for a few days EXAM: ABDOMEN - 1 VIEW COMPARISON:  X-ray 12/11/2022  FINDINGS: Scattered colonic stool. Overall moderate stool burden. Gas is seen in nondilated loops of small and large bowel. There is air along the rectum. Stomach is  mildly distended. No obvious free air on these supine radiographs. Note is made of bilateral pleural effusions and adjacent lung opacities. Please correlate with the x-ray of the chest from 12/08/2022. Scattered vascular calcifications. Osteopenia and degenerative changes. IMPRESSION: Moderate colonic stool.  Nonspecific bowel gas pattern Electronically Signed   By: Karen Kays M.D.   On: 12/11/2022 16:19   ECHOCARDIOGRAM COMPLETE  Result Date: 12/09/2022    ECHOCARDIOGRAM REPORT   Patient Name:   Stuart Watson Date of Exam: 12/09/2022 Medical Rec #:  [id number]      Height:       69.0 in Accession #:    [id number]     Weight:       129.9 lb Date of Birth:  August 08, 1931      BSA:          1.720 m Patient Age:    89 years       BP:           122/52 mmHg Patient Gender: M              HR:           66 bpm. Exam Location:  Inpatient Procedure: 2D Echo, Color Doppler and Cardiac Doppler Indications:    syncope  History:        Patient has prior history of Echocardiogram examinations, most                 recent 03/13/2021. Chronic kidney disease; Risk                 Factors:Dyslipidemia.  Sonographer:    Delcie Roch RDCS Referring Phys: [id number] [facility] General  Sonographer Comments: Image acquisition challenging due to uncooperative patient and dementia. IMPRESSIONS  1. Left ventricular ejection fraction, by estimation, is 60 to 65%. The left ventricle has normal function. The left ventricle has no regional wall motion abnormalities. Left ventricular diastolic parameters are consistent with Grade I diastolic dysfunction (impaired relaxation).  2. Right ventricular systolic function is normal. The right ventricular size is normal. There is normal pulmonary artery systolic pressure. The estimated right ventricular systolic pressure is 19.3 mmHg.  3. The  mitral valve is grossly normal. Mild mitral valve regurgitation. No evidence of mitral stenosis.  4. The aortic valve is tricuspid. There is moderate calcification of the aortic valve. There is moderate thickening of the aortic valve. Aortic valve regurgitation is not visualized. Mild aortic valve stenosis. Aortic valve mean gradient measures 10.7 mmHg. Aortic valve Vmax measures 2.22 m/s.  5. The inferior vena cava is normal in size with greater than 50% respiratory variability, suggesting right atrial pressure of 3 mmHg. Comparison(s): No significant change from prior study. FINDINGS  Left Ventricle: Left ventricular ejection fraction, by estimation, is 60 to 65%. The left ventricle has normal function. The left ventricle has no regional wall motion abnormalities. The left ventricular internal cavity size was normal in size. There is  no left ventricular hypertrophy. Left ventricular diastolic parameters are consistent with Grade I diastolic dysfunction (impaired relaxation). Right Ventricle: The right ventricular size is normal. No increase in right ventricular wall thickness. Right ventricular systolic function is normal. There is normal pulmonary artery systolic pressure. The tricuspid regurgitant velocity is 2.02 m/s, and  with an assumed right atrial pressure of 3 mmHg, the estimated right ventricular systolic pressure is 19.3 mmHg. Left Atrium: Left atrial size was normal in size. Right Atrium: Right atrial size was normal in size. Pericardium: Trivial  pericardial effusion is present. Mitral Valve: The mitral valve is grossly normal. Mild mitral valve regurgitation. No evidence of mitral valve stenosis. Tricuspid Valve: The tricuspid valve is grossly normal. Tricuspid valve regurgitation is trivial. No evidence of tricuspid stenosis. Aortic Valve: The aortic valve is tricuspid. There is moderate calcification of the aortic valve. There is moderate thickening of the aortic valve. Aortic valve regurgitation is  not visualized. Mild aortic stenosis is present. Aortic valve mean gradient measures 10.7 mmHg. Aortic valve peak gradient measures 19.8 mmHg. Aortic valve area, by VTI measures 1.39 cm. Pulmonic Valve: The pulmonic valve was grossly normal. Pulmonic valve regurgitation is mild. No evidence of pulmonic stenosis. Aorta: The aortic root and ascending aorta are structurally normal, with no evidence of dilitation. Venous: The inferior vena cava is normal in size with greater than 50% respiratory variability, suggesting right atrial pressure of 3 mmHg. IAS/Shunts: The atrial septum is grossly normal.  LEFT VENTRICLE PLAX 2D LVIDd:         3.80 cm   Diastology LVIDs:         2.40 cm   LV e' medial:    8.70 cm/s LV PW:         0.90 cm   LV E/e' medial:  10.9 LV IVS:        0.90 cm   LV e' lateral:   7.83 cm/s LVOT diam:     2.10 cm   LV E/e' lateral: 12.1 LV SV:         64 LV SV Index:   37 LVOT Area:     3.46 cm  RIGHT VENTRICLE             IVC RV S prime:     15.10 cm/s  IVC diam: 1.30 cm TAPSE (M-mode): 2.3 cm LEFT ATRIUM             Index        RIGHT ATRIUM           Index LA diam:        3.00 cm 1.74 cm/m   RA Area:     15.30 cm LA Vol (A2C):   36.4 ml 21.17 ml/m  RA Volume:   39.60 ml  23.03 ml/m LA Vol (A4C):   51.5 ml 29.95 ml/m LA Biplane Vol: 47.7 ml 27.74 ml/m  AORTIC VALVE AV Area (Vmax):    1.27 cm AV Area (Vmean):   1.21 cm AV Area (VTI):     1.39 cm AV Vmax:           222.33 cm/s AV Vmean:          149.000 cm/s AV VTI:            0.460 m AV Peak Grad:      19.8 mmHg AV Mean Grad:      10.7 mmHg LVOT Vmax:         81.30 cm/s LVOT Vmean:        52.100 cm/s LVOT VTI:          0.184 m LVOT/AV VTI ratio: 0.40  AORTA Ao Root diam: 3.00 cm Ao Asc diam:  2.60 cm MITRAL VALVE               TRICUSPID VALVE MV Area (PHT): 2.42 cm    TR Peak grad:   16.3 mmHg MV Decel Time: 313 msec    TR Vmax:        202.00 cm/s MV E  velocity: 94.70 cm/s MV A velocity: 90.10 cm/s  SHUNTS MV E/A ratio:  1.05        Systemic  VTI:  0.18 m                            Systemic Diam: 2.10 cm Lennie Odor MD Electronically signed by Lennie Odor MD Signature Date/Time: 12/09/2022/6:09:25 PM    Final    DG Forearm Right  Result Date: 12/08/2022 CLINICAL DATA:  Fall with forearm pain EXAM: RIGHT FOREARM - 2 VIEW COMPARISON:  None Available. FINDINGS: No acute fracture or dislocation. No elbow joint effusion. No significant soft tissue swelling or radiopaque foreign object. IMPRESSION: No acute osseous abnormality. Electronically Signed   By: Jeronimo Greaves M.D.   On: 12/08/2022 11:39   DG Chest Port 1 View  Result Date: 12/08/2022 CLINICAL DATA:  89 year old male status post fall. EXAM: PORTABLE CHEST 1 VIEW COMPARISON:  Cervical spine CT 1004 hours today. Chest radiographs 07/24/2022. FINDINGS: Portable AP view at 1013 hours. Ongoing blunting of both lung bases suggesting chronic pleural effusions. Lung base ventilation appears mildly improved from January. No superimposed pneumothorax, pulmonary edema, air bronchograms. Normal cardiac size and mediastinal contours. Visualized tracheal air column is within normal limits. No acute osseous abnormality identified. Negative visible bowel gas. IMPRESSION: 1. Bilateral pleural effusions persist since January with mildly improved lung base ventilation since that time. 2. No acute traumatic injury identified. Electronically Signed   By: Odessa Fleming M.D.   On: 12/08/2022 10:21   CT CERVICAL SPINE WO CONTRAST  Result Date: 12/08/2022 CLINICAL DATA:  Polytrauma, blunt.  Fall. EXAM: CT CERVICAL SPINE WITHOUT CONTRAST TECHNIQUE: Multidetector CT imaging of the cervical spine was performed without intravenous contrast. Multiplanar CT image reconstructions were also generated. RADIATION DOSE REDUCTION: This exam was performed according to the departmental dose-optimization program which includes automated exposure control, adjustment of the mA and/or kV according to patient size and/or use of  iterative reconstruction technique. COMPARISON:  06/25/2022 FINDINGS: Alignment: No subluxation. Skull base and vertebrae: No acute fracture. No primary bone lesion or focal pathologic process. Soft tissues and spinal canal: No prevertebral fluid or swelling. No visible canal hematoma. Disc levels: Moderate to advanced diffuse degenerative disc disease with disc space narrowing and spurring, and bilateral degenerative facet disease. Upper chest: No acute findings. Other: None IMPRESSION: Diffuse cervical spondylosis.  No acute bony abnormality. Electronically Signed   By: Charlett Nose M.D.   On: 12/08/2022 10:12   CT HEAD WO CONTRAST  Result Date: 12/08/2022 CLINICAL DATA:  Head trauma, moderate-severe.  Fall. EXAM: CT HEAD WITHOUT CONTRAST TECHNIQUE: Contiguous axial images were obtained from the base of the skull through the vertex without intravenous contrast. RADIATION DOSE REDUCTION: This exam was performed according to the departmental dose-optimization program which includes automated exposure control, adjustment of the mA and/or kV according to patient size and/or use of iterative reconstruction technique. COMPARISON:  06/25/2022 FINDINGS: Brain: There is atrophy and chronic small vessel disease changes. No acute intracranial abnormality. Specifically, no hemorrhage, hydrocephalus, mass lesion, acute infarction, or significant intracranial injury. Vascular: No hyperdense vessel or unexpected calcification. Skull: No acute calvarial abnormality. Sinuses/Orbits: No acute findings. Mucosal thickening throughout the paranasal sinuses. Mastoid air cells clear. Other: Soft tissue swelling in the left posterior parietal scalp. IMPRESSION: Atrophy, chronic microvascular disease. No acute intracranial abnormality. Electronically Signed   By: Charlett Nose M.D.   On: 12/08/2022 10:10  Assessment/Plan: Systolic CHF (HCC) moist cough, increased edema BLE, cough, rhonchi, on Torsemide 20mg  qd now(was Torsemide  60mg  bid, off Spironolactone bid), EF 60-65%, CXR 12/08/22 no acute process.  Increase Torsemide 40mg  qd, BMP one week.   Recurrent depression (HCC)  on Escitalopram  Hypothyroidism on Levothyroxine, TSH 2.36 11/23/22  Esophageal reflux  taking Pantoprazole, Hgb 11.3 12/20/22  Hypokalemia Kcl, K 4.0 12/20/22  CKD (chronic kidney disease) stage 3, GFR 30-59 ml/min (HCC) Bun/creat 24/0.87 12/20/22    Family/ staff Communication: plan of care reviewed with the patient and charge nurse.   Labs/tests ordered:  BMP one week  Time spend 35 minutes.

## 2022-12-21 NOTE — Assessment & Plan Note (Signed)
moist cough, increased edema BLE, cough, rhonchi, on Torsemide 20mg  qd now(was Torsemide 60mg  bid, off Spironolactone bid), EF 60-65%, CXR 12/08/22 no acute process.  Increase Torsemide 40mg  qd, BMP one week.

## 2022-12-21 NOTE — Assessment & Plan Note (Signed)
on Levothyroxine, TSH 2.36 11/23/22

## 2022-12-21 NOTE — Assessment & Plan Note (Signed)
on Escitalopram  

## 2022-12-24 ENCOUNTER — Other Ambulatory Visit: Payer: Self-pay | Admitting: Internal Medicine

## 2022-12-24 DIAGNOSIS — F03B0 Unspecified dementia, moderate, without behavioral disturbance, psychotic disturbance, mood disturbance, and anxiety: Secondary | ICD-10-CM

## 2022-12-25 ENCOUNTER — Non-Acute Institutional Stay (SKILLED_NURSING_FACILITY): Payer: Medicare Other | Admitting: Nurse Practitioner

## 2022-12-25 ENCOUNTER — Encounter: Payer: Self-pay | Admitting: Nurse Practitioner

## 2022-12-25 DIAGNOSIS — M1A9XX0 Chronic gout, unspecified, without tophus (tophi): Secondary | ICD-10-CM | POA: Diagnosis not present

## 2022-12-25 DIAGNOSIS — R350 Frequency of micturition: Secondary | ICD-10-CM | POA: Diagnosis not present

## 2022-12-25 DIAGNOSIS — F0390 Unspecified dementia without behavioral disturbance: Secondary | ICD-10-CM

## 2022-12-25 DIAGNOSIS — E785 Hyperlipidemia, unspecified: Secondary | ICD-10-CM | POA: Diagnosis not present

## 2022-12-25 DIAGNOSIS — I5022 Chronic systolic (congestive) heart failure: Secondary | ICD-10-CM

## 2022-12-25 DIAGNOSIS — R55 Syncope and collapse: Secondary | ICD-10-CM

## 2022-12-25 DIAGNOSIS — K219 Gastro-esophageal reflux disease without esophagitis: Secondary | ICD-10-CM | POA: Diagnosis not present

## 2022-12-25 DIAGNOSIS — E039 Hypothyroidism, unspecified: Secondary | ICD-10-CM | POA: Diagnosis not present

## 2022-12-25 DIAGNOSIS — F339 Major depressive disorder, recurrent, unspecified: Secondary | ICD-10-CM

## 2022-12-25 NOTE — Assessment & Plan Note (Signed)
No occurrence since admitted to SN[facility]. Syncope 2/2 to hypotension related to multiple diuretics/antihypertensive agents, autonomic dysfunction.

## 2022-12-25 NOTE — Assessment & Plan Note (Signed)
better moist cough, increased edema BLE, cough, rhonchi, on Torsemide 40mg  qd since 12/21/22(was Torsemide 60mg  bid, off Spironolactone bid), EF 60-65%, CXR 12/08/22 no acute process.

## 2022-12-25 NOTE — Assessment & Plan Note (Signed)
taking Pantoprazole, Hgb 11.3 12/20/22

## 2022-12-25 NOTE — Assessment & Plan Note (Signed)
Stable, on Escitalopram 

## 2022-12-25 NOTE — Assessment & Plan Note (Signed)
on Simvastatin 

## 2022-12-25 NOTE — Assessment & Plan Note (Signed)
Stable, taking Allopurinol

## 2022-12-25 NOTE — Assessment & Plan Note (Signed)
on Levothyroxine, TSH 2.36 11/23/22

## 2022-12-25 NOTE — Assessment & Plan Note (Signed)
No change,  on Doxazosin

## 2022-12-25 NOTE — Assessment & Plan Note (Signed)
on Donepezil, Memantine, MMSE 2/30 12/09/22

## 2022-12-25 NOTE — Progress Notes (Unsigned)
Location:  [facility] Room Number: NO/61/A Place of Service:  SNF (31) Provider:  Jonte Shiller X, NP   Patient Care Team: Mahlon Gammon, MD as PCP - General (Internal Medicine) Bensimhon, Bevelyn Buckles, MD as PCP - Advanced Heart Failure (Cardiology) Georgann Housekeeper, MD as Consulting Physician (Internal Medicine)  Extended Emergency Contact Information Primary Emergency Contact: Grizzle,[name] Mobile Phone: [phone] Relation: Son Secondary Emergency Contact: Caiazzo,[name] Mobile Phone: [phone] Relation: Son  Code Status:  DNR Goals of care: Advanced Directive information    12/25/2022   10:14 AM  Advanced Directives  Does Patient Have a Medical Advance Directive? Yes  Type of Estate agent of [place];Living will;Out of facility DNR (pink MOST or yellow form)  Does patient want to make changes to medical advance directive? No - Patient declined  Copy of Healthcare Power of Attorney in Chart? Yes - validated most recent copy scanned in chart (See row information)  Pre-existing out of facility DNR order (yellow form or pink MOST form) Pink MOST form placed in chart (order not valid for inpatient use);Yellow form placed in chart (order not valid for inpatient use)     Chief Complaint  Patient presents with   Medical Management of Chronic Issues    Patient is here for a follow up for chronic conditions, patient is also due for updated covid booster     HPI:  Pt is a 89 y.o. male seen today for medical management of chronic diseases.        Hospitalized 12/08/22-12/13/22 for syncope work up, fell backward while brushing teeth, resulted a hematoma back of head-staples removal 12/17/22,  2/2 to hypotension, multiple factorials: multiple diuretics/antihypertensive agents, autonomic dysfunction. Resolved after fluid resuscitation.              Cough associated with swallowing             CKD Bun/creat 24/0.87 12/20/22             Syncope  2/2 to hypotension related to multiple diuretics/antihypertensive agents, autonomic dysfunction.              Hypokalemia, Kcl, K 4.0 12/20/22             Dementia, on Donepezil, Memantine, MMSE 2/30 12/09/22             TIA, Hx of             Systolic CHF, better moist cough, increased edema BLE, cough, rhonchi, on Torsemide 40mg  qd since 12/21/22(was Torsemide 60mg  bid, off Spironolactone bid), EF 60-65%, CXR 12/08/22 no acute process.              GERD, taking Pantoprazole, Hgb 11.3 12/20/22             HLD, on Simvastatin             Gout, taking Allopurinol             Urinary frequency, on Doxazosin             Depression, on Escitalopram             Hypothyroidism, on Levothyroxine, TSH 2.36 11/23/22             OA, uses walker, w/c to go further, taking Tylenol   Past Medical History:  Diagnosis Date   Actinic keratoses    Arthritis    CHF (congestive heart failure) (HCC)    Chronic bilateral pleural effusions  Chronic sinusitis    Colon polyps    Dyspnea 07/13/2013   BNP normal   Edema 07/13/2013   GERD (gastroesophageal reflux disease)    omeprazole 1-2 times daily   H/O TIA (transient ischemic attack) and stroke    on statins and plavix -saw Dr.Sethi in the past   Headache    History of BPH    History of TIA (transient ischemic attack) 07/13/2013   Left inguinal hernia    Left inguinal hernia 01/27/2019   Lumbar radiculopathy    Need for shingles vaccine    Optic neuritis 10/2015   left eye   Perennial allergic rhinitis    Polymyalgia rheumatica (HCC) 07/13/2013   Daily prednisone   Restless leg syndrome    Retinal disease    right eye since birth    Shingles    Temporal arteritis (HCC) 10/2015   Wears glasses    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   Past Surgical History:  Procedure Laterality Date   BRAVO PH STUDY N/A 04/18/2016   Procedure: BRAVO PH STUDY;  Surgeon: Charlott Rakes, MD;  Location: [facility];  Service: Endoscopy;  Laterality: N/A;   CHEST TUBE  INSERTION Right 02/13/2018   Procedure: INSERTION PLEURAL DRAINAGE CATHETER;  Surgeon: Kerin Perna, MD;  Location: [facility] OR;  Service: Thoracic;  Laterality: Right;   CHEST TUBE INSERTION Left 11/27/2019   Procedure: REDO INSERTION PLEURAL DRAINAGE CATHETER USING 15.5 FR. PLEURX CATH;  Surgeon: Donata Clay, Theron Arista, MD;  Location: [facility] OR;  Service: Thoracic;  Laterality: Left;   COLONOSCOPY     drropy eyelid surgery     ESOPHAGOGASTRODUODENOSCOPY (EGD) WITH PROPOFOL N/A 04/18/2016   Procedure: ESOPHAGOGASTRODUODENOSCOPY (EGD) WITH PROPOFOL;  Surgeon: Charlott Rakes, MD;  Location: [facility];  Service: Endoscopy;  Laterality: N/A;   INGUINAL HERNIA REPAIR Left 01/27/2019   Procedure: OPEN REPAIR LEFT INGUINAL HERNIA WITH MESH;  Surgeon: Claud Kelp, MD;  Location: [facility] OR;  Service: General;  Laterality: Left;  GENERAL AND TAP BLOCK ANESTHESIA   INSERTION OF MESH Left 01/27/2019   Procedure: Insertion Of Mesh;  Surgeon: Claud Kelp, MD;  Location: [facility] OR;  Service: General;  Laterality: Left;   IR THORACENTESIS ASP PLEURAL SPACE W/IMG GUIDE  02/04/2018   IR THORACENTESIS ASP PLEURAL SPACE W/IMG GUIDE  02/06/2018   REMOVAL OF PLEURAL DRAINAGE CATHETER Left 11/27/2019   Procedure: REMOVAL OF PLEURAL DRAINAGE CATHETER;  Surgeon: Kerin Perna, MD;  Location: [facility] OR;  Service: Thoracic;  Laterality: Left;   REMOVAL OF PLEURAL DRAINAGE CATHETER Left 03/31/2020   Procedure: REMOVAL OF PLEURAL DRAINAGE CATHETER;  Surgeon: Kerin Perna, MD;  Location: [facility] OR;  Service: Thoracic;  Laterality: Left;   RIGHT/LEFT HEART CATH AND CORONARY ANGIOGRAPHY N/A 06/24/2018   Procedure: RIGHT/LEFT HEART CATH AND CORONARY ANGIOGRAPHY;  Surgeon: Dolores Patty, MD;  Location: [facility] INVASIVE CV LAB;  Service: Cardiovascular;  Laterality: N/A;   VASECTOMY      No Known Allergies  Outpatient Encounter Medications as of 12/25/2022  Medication Sig   acetaminophen (TYLENOL) 500 MG tablet Take 2 tablets (1,000 mg total) by  mouth in the morning and at bedtime.   allopurinol (ZYLOPRIM) 300 MG tablet Take 150 mg by mouth in the morning. For gout   aspirin 81 MG chewable tablet Chew 81 mg by mouth in the morning. Chew and swallow tablet   donepezil (ARICEPT ODT) 5 MG disintegrating tablet Take 1 tablet (5 mg total) by mouth at bedtime. For co++ + (Patient  taking differently: Take 5 mg by mouth at bedtime.)   doxazosin (CARDURA) 4 MG tablet Take 4 mg by mouth daily.   escitalopram (LEXAPRO) 10 MG tablet Take 1 tablet (10 mg total) by mouth daily.   levothyroxine (SYNTHROID) 25 MCG tablet Take 25 mcg by mouth daily before breakfast.   pantoprazole (PROTONIX) 20 MG tablet Take 20 mg by mouth daily before breakfast.   potassium chloride SA (KLOR-CON M) 20 MEQ tablet Take 40 mEq by mouth daily.   simvastatin (ZOCOR) 20 MG tablet Take 20 mg by mouth every evening.   torsemide (DEMADEX) 20 MG tablet Take 1 tablet (20 mg total) by mouth daily. Give 3 tablet by mouth   [DISCONTINUED] memantine (NAMENDA) 10 MG tablet Take 1 tablet (10 mg total) by mouth 2 (two) times daily.   No facility-administered encounter medications on file as of 12/25/2022.    Review of Systems  Unable to perform ROS: Dementia    Immunization History  Administered Date(s) Administered   Fluad Quad(high Dose 65+) 04/27/2021, 05/16/2022   Influenza, High Dose Seasonal PF 04/15/2017, 05/16/2018   Influenza,inj,Quad PF,6+ Mos 04/23/2015   Moderna SARS-COV2 Booster Vaccination 12/28/2020, 04/20/2021   Moderna Sars-Covid-2 Vaccination 07/26/2019, 08/26/2019, 06/06/2020   PPD Test 12/13/2022   Pneumococcal Conjugate-13 02/14/2016, 07/23/2016   Pneumococcal Polysaccharide-23 11/13/2012   Td 01/29/2006   Tdap 08/07/2018   Zoster Recombinat (Shingrix) 02/18/2019   Zoster, Live 02/18/2019, 07/01/2019   Pertinent  Health Maintenance Due  Topic Date Due   INFLUENZA VACCINE  02/21/2023      05/10/2022    3:46 PM 06/25/2022    2:44 AM 08/08/2022    10:15 AM 12/14/2022   10:26 AM 12/25/2022   10:14 AM  Fall Risk  Falls in the past year? 1  0 1 1  Was there an injury with Fall? 0  0 1 1  Fall Risk Category Calculator 2  0 3 3  Fall Risk Category (Retired) Moderate      (RETIRED) Patient Fall Risk Level Moderate fall risk High fall risk     Patient at Risk for Falls Due to History of fall(s);Impaired balance/gait;Impaired mobility  No Fall Risks History of fall(s);Impaired balance/gait;Impaired mobility;Impaired vision History of fall(s);Impaired balance/gait;Impaired mobility;Impaired vision  Fall risk Follow up Falls evaluation completed  Falls evaluation completed Falls prevention discussed;Falls evaluation completed Falls evaluation completed   Functional Status Survey:    Vitals:   12/25/22 1043  BP: (!) 110/57  Pulse: 71  Resp: 18  Temp: (!) 96.6 F (35.9 C)  SpO2: 94%   There is no height or weight on file to calculate BMI. Physical Exam Vitals and nursing note reviewed.  Constitutional:      Comments: weak  HENT:     Head: Normocephalic.     Nose: Nose normal.     Mouth/Throat:     Mouth: Mucous membranes are moist.  Eyes:     Extraocular Movements: Extraocular movements intact.     Conjunctiva/sclera: Conjunctivae normal.     Pupils: Pupils are equal, round, and reactive to light.  Cardiovascular:     Rate and Rhythm: Normal rate and regular rhythm.     Heart sounds: Murmur heard.  Pulmonary:     Effort: Pulmonary effort is normal.     Breath sounds: No wheezing, rhonchi or rales.  Abdominal:     General: Bowel sounds are normal.     Palpations: Abdomen is soft.     Tenderness: There is no abdominal  tenderness.     Hernia: A hernia is present.     Comments: Hx of left inguinal hernia.   Musculoskeletal:     Cervical back: Normal range of motion and neck supple.     Right lower leg: Edema present.     Left lower leg: Edema present.     Comments: 2+ edema BLE  Skin:    General: Skin is warm and dry.      Comments: Staples scalp back of head, no active bleeding or s/s of infection.   Neurological:     General: No focal deficit present.     Mental Status: He is alert. Mental status is at baseline.     Motor: No weakness.     Coordination: Coordination normal.     Gait: Gait abnormal.     Comments: Oriented to person, place.   Psychiatric:        Mood and Affect: Mood normal.        Behavior: Behavior normal.     Labs reviewed: Recent Labs    12/08/22 0920 12/08/22 1135 12/08/22 1559 12/08/22 2145 12/09/22 0220 12/10/22 0711  NA 134*  --   --   --  135 144  K 2.6*  --   --  3.7 3.3* 4.4  CL 96*  --   --   --  103 114*  CO2 25  --   --   --  24 24  GLUCOSE 107*  --   --   --  106* 100*  BUN 51*  --   --   --  34* 24*  CREATININE 1.48*  --   --   --  1.15 0.99  CALCIUM 8.6*  --   --   --  8.3* 8.1*  MG  --  2.4  --   --   --   --   PHOS  --   --  3.4  --   --   --    Recent Labs    11/23/22 0000 12/08/22 0920 12/10/22 0711  AST 15 23 17   ALT 11 13 12   ALKPHOS 59 53 45  BILITOT  --  0.5 0.4  PROT  --  6.5 5.6*  ALBUMIN 3.9 3.3* 2.6*   Recent Labs    06/25/22 1330 11/23/22 0000 12/08/22 0920 12/10/22 0711  WBC 10.4 8.8 10.6* 9.1  NEUTROABS 7,124.00 4,409.00 5.8  --   HGB 12.5* 13.1* 12.7* 11.5*  HCT 37* 40* 38.0* 35.8*  MCV  --   --  99.5 100.3*  PLT 403* 334 334 314   Lab Results  Component Value Date   TSH 2.36 11/23/2022   No results found for: "HGBA1C" Lab Results  Component Value Date   CHOL 121 11/23/2022   HDL 48 11/23/2022   LDLCALC 50 11/23/2022   TRIG 144 11/23/2022    Significant Diagnostic Results in last 30 days:  DG Abd 1 View  Result Date: 12/11/2022 CLINICAL DATA:  Abdominal pain for a few days EXAM: ABDOMEN - 1 VIEW COMPARISON:  X-ray 12/11/2022 FINDINGS: Scattered colonic stool. Overall moderate stool burden. Gas is seen in nondilated loops of small and large bowel. There is air along the rectum. Stomach is mildly distended. No  obvious free air on these supine radiographs. Note is made of bilateral pleural effusions and adjacent lung opacities. Please correlate with the x-ray of the chest from 12/08/2022. Scattered vascular calcifications. Osteopenia and degenerative changes. IMPRESSION: Moderate colonic stool.  Nonspecific  bowel gas pattern Electronically Signed   By: Karen Kays M.D.   On: 12/11/2022 16:19   ECHOCARDIOGRAM COMPLETE  Result Date: 12/09/2022    ECHOCARDIOGRAM REPORT   Patient Name:   KAYLER KOSKY Date of Exam: 12/09/2022 Medical Rec #:  [id number]      Height:       69.0 in Accession #:    [id number]     Weight:       129.9 lb Date of Birth:  11/28/1931      BSA:          1.720 m Patient Age:    89 years       BP:           122/52 mmHg Patient Gender: M              HR:           66 bpm. Exam Location:  Inpatient Procedure: 2D Echo, Color Doppler and Cardiac Doppler Indications:    syncope  History:        Patient has prior history of Echocardiogram examinations, most                 recent 03/13/2021. Chronic kidney disease; Risk                 Factors:Dyslipidemia.  Sonographer:    Delcie Roch RDCS Referring Phys: [id number] [place] General  Sonographer Comments: Image acquisition challenging due to uncooperative patient and dementia. IMPRESSIONS  1. Left ventricular ejection fraction, by estimation, is 60 to 65%. The left ventricle has normal function. The left ventricle has no regional wall motion abnormalities. Left ventricular diastolic parameters are consistent with Grade I diastolic dysfunction (impaired relaxation).  2. Right ventricular systolic function is normal. The right ventricular size is normal. There is normal pulmonary artery systolic pressure. The estimated right ventricular systolic pressure is 19.3 mmHg.  3. The mitral valve is grossly normal. Mild mitral valve regurgitation. No evidence of mitral stenosis.  4. The aortic valve is tricuspid. There is moderate calcification of the aortic valve.  There is moderate thickening of the aortic valve. Aortic valve regurgitation is not visualized. Mild aortic valve stenosis. Aortic valve mean gradient measures 10.7 mmHg. Aortic valve Vmax measures 2.22 m/s.  5. The inferior vena cava is normal in size with greater than 50% respiratory variability, suggesting right atrial pressure of 3 mmHg. Comparison(s): No significant change from prior study. FINDINGS  Left Ventricle: Left ventricular ejection fraction, by estimation, is 60 to 65%. The left ventricle has normal function. The left ventricle has no regional wall motion abnormalities. The left ventricular internal cavity size was normal in size. There is  no left ventricular hypertrophy. Left ventricular diastolic parameters are consistent with Grade I diastolic dysfunction (impaired relaxation). Right Ventricle: The right ventricular size is normal. No increase in right ventricular wall thickness. Right ventricular systolic function is normal. There is normal pulmonary artery systolic pressure. The tricuspid regurgitant velocity is 2.02 m/s, and  with an assumed right atrial pressure of 3 mmHg, the estimated right ventricular systolic pressure is 19.3 mmHg. Left Atrium: Left atrial size was normal in size. Right Atrium: Right atrial size was normal in size. Pericardium: Trivial pericardial effusion is present. Mitral Valve: The mitral valve is grossly normal. Mild mitral valve regurgitation. No evidence of mitral valve stenosis. Tricuspid Valve: The tricuspid valve is grossly normal. Tricuspid valve regurgitation is trivial. No evidence of tricuspid stenosis. Aortic Valve: The aortic  valve is tricuspid. There is moderate calcification of the aortic valve. There is moderate thickening of the aortic valve. Aortic valve regurgitation is not visualized. Mild aortic stenosis is present. Aortic valve mean gradient measures 10.7 mmHg. Aortic valve peak gradient measures 19.8 mmHg. Aortic valve area, by VTI measures 1.39  cm. Pulmonic Valve: The pulmonic valve was grossly normal. Pulmonic valve regurgitation is mild. No evidence of pulmonic stenosis. Aorta: The aortic root and ascending aorta are structurally normal, with no evidence of dilitation. Venous: The inferior vena cava is normal in size with greater than 50% respiratory variability, suggesting right atrial pressure of 3 mmHg. IAS/Shunts: The atrial septum is grossly normal.  LEFT VENTRICLE PLAX 2D LVIDd:         3.80 cm   Diastology LVIDs:         2.40 cm   LV e' medial:    8.70 cm/s LV PW:         0.90 cm   LV E/e' medial:  10.9 LV IVS:        0.90 cm   LV e' lateral:   7.83 cm/s LVOT diam:     2.10 cm   LV E/e' lateral: 12.1 LV SV:         64 LV SV Index:   37 LVOT Area:     3.46 cm  RIGHT VENTRICLE             IVC RV S prime:     15.10 cm/s  IVC diam: 1.30 cm TAPSE (M-mode): 2.3 cm LEFT ATRIUM             Index        RIGHT ATRIUM           Index LA diam:        3.00 cm 1.74 cm/m   RA Area:     15.30 cm LA Vol (A2C):   36.4 ml 21.17 ml/m  RA Volume:   39.60 ml  23.03 ml/m LA Vol (A4C):   51.5 ml 29.95 ml/m LA Biplane Vol: 47.7 ml 27.74 ml/m  AORTIC VALVE AV Area (Vmax):    1.27 cm AV Area (Vmean):   1.21 cm AV Area (VTI):     1.39 cm AV Vmax:           222.33 cm/s AV Vmean:          149.000 cm/s AV VTI:            0.460 m AV Peak Grad:      19.8 mmHg AV Mean Grad:      10.7 mmHg LVOT Vmax:         81.30 cm/s LVOT Vmean:        52.100 cm/s LVOT VTI:          0.184 m LVOT/AV VTI ratio: 0.40  AORTA Ao Root diam: 3.00 cm Ao Asc diam:  2.60 cm MITRAL VALVE               TRICUSPID VALVE MV Area (PHT): 2.42 cm    TR Peak grad:   16.3 mmHg MV Decel Time: 313 msec    TR Vmax:        202.00 cm/s MV E velocity: 94.70 cm/s MV A velocity: 90.10 cm/s  SHUNTS MV E/A ratio:  1.05        Systemic VTI:  0.18 m  Systemic Diam: 2.10 cm Lennie Odor MD Electronically signed by Lennie Odor MD Signature Date/Time: 12/09/2022/6:09:25 PM    Final    DG  Forearm Right  Result Date: 12/08/2022 CLINICAL DATA:  Fall with forearm pain EXAM: RIGHT FOREARM - 2 VIEW COMPARISON:  None Available. FINDINGS: No acute fracture or dislocation. No elbow joint effusion. No significant soft tissue swelling or radiopaque foreign object. IMPRESSION: No acute osseous abnormality. Electronically Signed   By: Jeronimo Greaves M.D.   On: 12/08/2022 11:39   DG Chest Port 1 View  Result Date: 12/08/2022 CLINICAL DATA:  89 year old male status post fall. EXAM: PORTABLE CHEST 1 VIEW COMPARISON:  Cervical spine CT 1004 hours today. Chest radiographs 07/24/2022. FINDINGS: Portable AP view at 1013 hours. Ongoing blunting of both lung bases suggesting chronic pleural effusions. Lung base ventilation appears mildly improved from January. No superimposed pneumothorax, pulmonary edema, air bronchograms. Normal cardiac size and mediastinal contours. Visualized tracheal air column is within normal limits. No acute osseous abnormality identified. Negative visible bowel gas. IMPRESSION: 1. Bilateral pleural effusions persist since January with mildly improved lung base ventilation since that time. 2. No acute traumatic injury identified. Electronically Signed   By: Odessa Fleming M.D.   On: 12/08/2022 10:21   CT CERVICAL SPINE WO CONTRAST  Result Date: 12/08/2022 CLINICAL DATA:  Polytrauma, blunt.  Fall. EXAM: CT CERVICAL SPINE WITHOUT CONTRAST TECHNIQUE: Multidetector CT imaging of the cervical spine was performed without intravenous contrast. Multiplanar CT image reconstructions were also generated. RADIATION DOSE REDUCTION: This exam was performed according to the departmental dose-optimization program which includes automated exposure control, adjustment of the mA and/or kV according to patient size and/or use of iterative reconstruction technique. COMPARISON:  06/25/2022 FINDINGS: Alignment: No subluxation. Skull base and vertebrae: No acute fracture. No primary bone lesion or focal pathologic  process. Soft tissues and spinal canal: No prevertebral fluid or swelling. No visible canal hematoma. Disc levels: Moderate to advanced diffuse degenerative disc disease with disc space narrowing and spurring, and bilateral degenerative facet disease. Upper chest: No acute findings. Other: None IMPRESSION: Diffuse cervical spondylosis.  No acute bony abnormality. Electronically Signed   By: Charlett Nose M.D.   On: 12/08/2022 10:12   CT HEAD WO CONTRAST  Result Date: 12/08/2022 CLINICAL DATA:  Head trauma, moderate-severe.  Fall. EXAM: CT HEAD WITHOUT CONTRAST TECHNIQUE: Contiguous axial images were obtained from the base of the skull through the vertex without intravenous contrast. RADIATION DOSE REDUCTION: This exam was performed according to the departmental dose-optimization program which includes automated exposure control, adjustment of the mA and/or kV according to patient size and/or use of iterative reconstruction technique. COMPARISON:  06/25/2022 FINDINGS: Brain: There is atrophy and chronic small vessel disease changes. No acute intracranial abnormality. Specifically, no hemorrhage, hydrocephalus, mass lesion, acute infarction, or significant intracranial injury. Vascular: No hyperdense vessel or unexpected calcification. Skull: No acute calvarial abnormality. Sinuses/Orbits: No acute findings. Mucosal thickening throughout the paranasal sinuses. Mastoid air cells clear. Other: Soft tissue swelling in the left posterior parietal scalp. IMPRESSION: Atrophy, chronic microvascular disease. No acute intracranial abnormality. Electronically Signed   By: Charlett Nose M.D.   On: 12/08/2022 10:10    Assessment/Plan Senile dementia (HCC) on Donepezil, Memantine, MMSE 2/30 12/09/22  Systolic CHF (HCC)  better moist cough, increased edema BLE, cough, rhonchi, on Torsemide 40mg  qd since 12/21/22(was Torsemide 60mg  bid, off Spironolactone bid), EF 60-65%, CXR 12/08/22 no acute process.   Esophageal reflux   taking Pantoprazole, Hgb 11.3 12/20/22  HLD (hyperlipidemia) on Simvastatin  Gout Stable, taking Allopurinol  Urinary frequency No change,  on Doxazosin  Recurrent depression (HCC) Stable,  on Escitalopram  Hypothyroidism on Levothyroxine, TSH 2.36 11/23/22  Syncope and collapse No occurrence since admitted to SNF [facility]. Syncope 2/2 to hypotension related to multiple diuretics/antihypertensive agents, autonomic dysfunction.      Family/ staff Communication: plan of care reviewed with the patient and charge nurse.   Labs/tests ordered:  none  Time spend 35 minutes.

## 2022-12-27 ENCOUNTER — Encounter: Payer: Self-pay | Admitting: Nurse Practitioner

## 2022-12-28 ENCOUNTER — Encounter: Payer: Self-pay | Admitting: Nurse Practitioner

## 2023-01-03 ENCOUNTER — Other Ambulatory Visit (HOSPITAL_COMMUNITY): Payer: Self-pay | Admitting: Internal Medicine

## 2023-01-21 ENCOUNTER — Encounter: Payer: Self-pay | Admitting: Nurse Practitioner

## 2023-01-21 ENCOUNTER — Non-Acute Institutional Stay (SKILLED_NURSING_FACILITY): Payer: Medicare Other | Admitting: Nurse Practitioner

## 2023-01-21 DIAGNOSIS — R350 Frequency of micturition: Secondary | ICD-10-CM | POA: Diagnosis not present

## 2023-01-21 DIAGNOSIS — M1990 Unspecified osteoarthritis, unspecified site: Secondary | ICD-10-CM

## 2023-01-21 DIAGNOSIS — E039 Hypothyroidism, unspecified: Secondary | ICD-10-CM | POA: Diagnosis not present

## 2023-01-21 DIAGNOSIS — M6281 Muscle weakness (generalized): Secondary | ICD-10-CM | POA: Diagnosis not present

## 2023-01-21 DIAGNOSIS — R55 Syncope and collapse: Secondary | ICD-10-CM | POA: Diagnosis not present

## 2023-01-21 DIAGNOSIS — I5022 Chronic systolic (congestive) heart failure: Secondary | ICD-10-CM

## 2023-01-21 DIAGNOSIS — E785 Hyperlipidemia, unspecified: Secondary | ICD-10-CM | POA: Diagnosis not present

## 2023-01-21 DIAGNOSIS — F339 Major depressive disorder, recurrent, unspecified: Secondary | ICD-10-CM | POA: Diagnosis not present

## 2023-01-21 DIAGNOSIS — F0390 Unspecified dementia without behavioral disturbance: Secondary | ICD-10-CM | POA: Diagnosis not present

## 2023-01-21 DIAGNOSIS — N1831 Chronic kidney disease, stage 3a: Secondary | ICD-10-CM

## 2023-01-21 DIAGNOSIS — M1A9XX0 Chronic gout, unspecified, without tophus (tophi): Secondary | ICD-10-CM

## 2023-01-21 DIAGNOSIS — R2689 Other abnormalities of gait and mobility: Secondary | ICD-10-CM | POA: Diagnosis not present

## 2023-01-21 DIAGNOSIS — K219 Gastro-esophageal reflux disease without esophagitis: Secondary | ICD-10-CM | POA: Diagnosis not present

## 2023-01-21 DIAGNOSIS — R1312 Dysphagia, oropharyngeal phase: Secondary | ICD-10-CM | POA: Diagnosis not present

## 2023-01-21 NOTE — Progress Notes (Unsigned)
Location:  [facility] Room Number: NO/61/A Place of Service:  SNF (31) Provider:  Kacyn Souder X, NP   Patient Care Team: Mahlon Gammon, MD as PCP - General (Internal Medicine) Bensimhon, Bevelyn Buckles, MD as PCP - Advanced Heart Failure (Cardiology) Georgann Housekeeper, MD as Consulting Physician (Internal Medicine)  Extended Emergency Contact Information Primary Emergency Contact: Sakurai,[name] Mobile Phone: [phone] Relation: Son Secondary Emergency Contact: Blankenbaker,[name] Mobile Phone: [phone] Relation: Son  Code Status:  DNR Goals of care: Advanced Directive information    01/21/2023   10:00 AM  Advanced Directives  Does Patient Have a Medical Advance Directive? Yes  Type of Estate agent of [place];Living will;Out of facility DNR (pink MOST or yellow form)  Does patient want to make changes to medical advance directive? No - Patient declined  Copy of Healthcare Power of Attorney in Chart? Yes - validated most recent copy scanned in chart (See row information)  Pre-existing out of facility DNR order (yellow form or pink MOST form) Pink MOST form placed in chart (order not valid for inpatient use);Yellow form placed in chart (order not valid for inpatient use)     Chief Complaint  Patient presents with   Medical Management of Chronic Issues    Patient is here for a follow up for chronic conditions    Quality Metric Gaps    Patient it due for updated covid vaccine    HPI:  Pt is a 89 y.o. male seen today for medical management of chronic diseases.     Past Medical History:  Diagnosis Date   Actinic keratoses    Arthritis    CHF (congestive heart failure) (HCC)    Chronic bilateral pleural effusions    Chronic sinusitis    Colon polyps    Dyspnea 07/13/2013   BNP normal   Edema 07/13/2013   GERD (gastroesophageal reflux disease)    omeprazole 1-2 times daily   H/O TIA (transient ischemic attack) and stroke    on  statins and plavix -saw Dr.Sethi in the past   Headache    History of BPH    History of TIA (transient ischemic attack) 07/13/2013   Left inguinal hernia    Left inguinal hernia 01/27/2019   Lumbar radiculopathy    Need for shingles vaccine    Optic neuritis 10/2015   left eye   Perennial allergic rhinitis    Polymyalgia rheumatica (HCC) 07/13/2013   Daily prednisone   Restless leg syndrome    Retinal disease    right eye since birth    Shingles    Temporal arteritis (HCC) 10/2015   Wears glasses    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   Past Surgical History:  Procedure Laterality Date   BRAVO PH STUDY N/A 04/18/2016   Procedure: BRAVO PH STUDY;  Surgeon: Charlott Rakes, MD;  Location: [facility];  Service: Endoscopy;  Laterality: N/A;   CHEST TUBE INSERTION Right 02/13/2018   Procedure: INSERTION PLEURAL DRAINAGE CATHETER;  Surgeon: Kerin Perna, MD;  Location: [facility] OR;  Service: Thoracic;  Laterality: Right;   CHEST TUBE INSERTION Left 11/27/2019   Procedure: REDO INSERTION PLEURAL DRAINAGE CATHETER USING 15.5 FR. PLEURX CATH;  Surgeon: Donata Clay, Theron Arista, MD;  Location: [facility] OR;  Service: Thoracic;  Laterality: Left;   COLONOSCOPY     drropy eyelid surgery     ESOPHAGOGASTRODUODENOSCOPY (EGD) WITH PROPOFOL N/A 04/18/2016   Procedure: ESOPHAGOGASTRODUODENOSCOPY (EGD) WITH PROPOFOL;  Surgeon: Charlott Rakes, MD;  Location: [facility]  END[facility]COPY;  Service: Endoscopy;  Laterality: N/A;   INGUINAL HERNIA REPAIR Left 01/27/2019   Procedure: OPEN REPAIR LEFT INGUINAL HERNIA WITH MESH;  Surgeon: Claud Kelp, MD;  Location: [facility] OR;  Service: General;  Laterality: Left;  GENERAL AND TAP BLOCK ANESTHESIA   INSERTION OF MESH Left 01/27/2019   Procedure: Insertion Of Mesh;  Surgeon: Claud Kelp, MD;  Location[facility] OR;  Service: General;  Laterality: Left;   IR THORACENTESIS ASP PLEURAL SPACE W/IMG GUIDE  02/04/2018   IR THORACENTESIS ASP PLEURAL SPACE W/IMG GUIDE  02/06/2018   REMOVAL OF PLEURAL DRAINAGE  CATHETER Left 11/27/2019   Procedure: REMOVAL OF PLEURAL DRAINAGE CATHETER;  Surgeon: Kerin Perna, MD;  Location: [facility] OR;  Service: Thoracic;  Laterality: Left;   REMOVAL OF PLEURAL DRAINAGE CATHETER Left 03/31/2020   Procedure: REMOVAL OF PLEURAL DRAINAGE CATHETER;  Surgeon: Kerin Perna, MD;  Location: [facility] OR;  Service: Thoracic;  Laterality: Left;   RIGHT/LEFT HEART CATH AND CORONARY ANGIOGRAPHY N/A 06/24/2018   Procedure: RIGHT/LEFT HEART CATH AND CORONARY ANGIOGRAPHY;  Surgeon: Dolores Patty, MD;  Location: [facility] INVASIVE CV LAB;  Service: Cardiovascular;  Laterality: N/A;   VASECTOMY      No Known Allergies  Outpatient Encounter Medications as of 01/21/2023  Medication Sig   acetaminophen (TYLENOL) 500 MG tablet Take 2 tablets (1,000 mg total) by mouth in the morning and at bedtime.   allopurinol (ZYLOPRIM) 300 MG tablet Take 150 mg by mouth in the morning. For gout   aspirin 81 MG chewable tablet Chew 81 mg by mouth in the morning. Chew and swallow tablet   donepezil (ARICEPT ODT) 5 MG disintegrating tablet Take 1 tablet (5 mg total) by mouth at bedtime. For co++ + (Patient taking differently: Take 5 mg by mouth at bedtime.)   doxazosin (CARDURA) 4 MG tablet Take 4 mg by mouth daily.   escitalopram (LEXAPRO) 10 MG tablet Take 1 tablet (10 mg total) by mouth daily.   levothyroxine (SYNTHROID) 25 MCG tablet Take 25 mcg by mouth daily before breakfast.   memantine (NAMENDA) 10 MG tablet TAKE 1 TABLET BY MOUTH TWICE  DAILY   pantoprazole (PROTONIX) 20 MG tablet Take 20 mg by mouth daily before breakfast.   potassium chloride SA (KLOR-CON M) 20 MEQ tablet Take 40 mEq by mouth daily.   simvastatin (ZOCOR) 20 MG tablet Take 20 mg by mouth every evening.   torsemide (DEMADEX) 20 MG tablet TAKE 3 TABLETS BY MOUTH IN THE  MORNING AND 2 TABLETS BY MOUTH  IN THE EVENING   No facility-administered encounter medications on file as of 01/21/2023.    Review of Systems  Immunization History   Administered Date(s) Administered   Fluad Quad(high Dose 65+) 04/27/2021, 05/16/2022   Influenza, High Dose Seasonal PF 04/15/2017, 05/16/2018   Influenza,inj,Quad PF,6+ Mos 04/23/2015   Moderna SARS-COV2 Booster Vaccination 12/28/2020, 04/20/2021   Moderna Sars-Covid-2 Vaccination 07/26/2019, 08/26/2019, 06/06/2020   PPD Test 12/13/2022   Pneumococcal Conjugate-13 02/14/2016, 07/23/2016   Pneumococcal Polysaccharide-23 11/13/2012   Td 01/29/2006   Tdap 08/07/2018   Zoster Recombinant(Shingrix) 02/18/2019   Zoster, Live 02/18/2019, 07/01/2019   Pertinent  Health Maintenance Due  Topic Date Due   INFLUENZA VACCINE  02/21/2023      06/25/2022    2:44 AM 08/08/2022   10:15 AM 12/14/2022   10:26 AM 12/25/2022   10:14 AM 01/21/2023   10:00 AM  Fall Risk  Falls in the past year?  0 1 1 1  Was there an injury with Fall?  0 1 1 1   Fall Risk Category Calculator  0 3 3 3   (RETIRED) Patient Fall Risk Level High fall risk      Patient at Risk for Falls Due to  No Fall Risks History of fall(s);Impaired balance/gait;Impaired mobility;Impaired vision History of fall(s);Impaired balance/gait;Impaired mobility;Impaired vision History of fall(s);Impaired balance/gait;Impaired mobility  Fall risk Follow up  Falls evaluation completed Falls prevention discussed;Falls evaluation completed Falls evaluation completed Falls evaluation completed   Functional Status Survey:    Vitals:   01/21/23 0959  BP: 124/75  Pulse: 60  Resp: 18  Temp: 97.8 F (36.6 C)  SpO2: 98%  Weight: 141 lb 14.4 oz (64.4 kg)  Height: 5\' 9"  (1.753 m)   Body mass index is 20.95 kg/m. Physical Exam  Labs reviewed: Recent Labs    12/08/22 0920 12/08/22 1135 12/08/22 1559 12/08/22 2145 12/09/22 0220 12/10/22 0711  NA 134*  --   --   --  135 144  K 2.6*  --   --  3.7 3.3* 4.4  CL 96*  --   --   --  103 114*  CO2 25  --   --   --  24 24  GLUCOSE 107*  --   --   --  106* 100*  BUN 51*  --   --   --  34* 24*   CREATININE 1.48*  --   --   --  1.15 0.99  CALCIUM 8.6*  --   --   --  8.3* 8.1*  MG  --  2.4  --   --   --   --   PHOS  --   --  3.4  --   --   --    Recent Labs    11/23/22 0000 12/08/22 0920 12/10/22 0711  AST 15 23 17   ALT 11 13 12   ALKPHOS 59 53 45  BILITOT  --  0.5 0.4  PROT  --  6.5 5.6*  ALBUMIN 3.9 3.3* 2.6*   Recent Labs    06/25/22 1330 11/23/22 0000 12/08/22 0920 12/10/22 0711  WBC 10.4 8.8 10.6* 9.1  NEUTROABS 7,124.00 4,409.00 5.8  --   HGB 12.5* 13.1* 12.7* 11.5*  HCT 37* 40* 38.0* 35.8*  MCV  --   --  99.5 100.3*  PLT 403* 334 334 314   Lab Results  Component Value Date   TSH 2.36 11/23/2022   No results found for: "HGBA1C" Lab Results  Component Value Date   CHOL 121 11/23/2022   HDL 48 11/23/2022   LDLCALC 50 11/23/2022   TRIG 144 11/23/2022    Significant Diagnostic Results in last 30 days:  No results found.  Assessment/Plan There are no diagnoses linked to this encounter.   Family/ staff Communication: ***  Labs/tests ordered:  ***

## 2023-01-22 ENCOUNTER — Encounter: Payer: Self-pay | Admitting: Nurse Practitioner

## 2023-01-22 NOTE — Assessment & Plan Note (Signed)
Bun/creat 24/0.87 12/20/22 

## 2023-01-22 NOTE — Assessment & Plan Note (Signed)
on Donepezil, Memantine, MMSE 2/30 12/09/22 

## 2023-01-22 NOTE — Assessment & Plan Note (Signed)
taking Allopurinol, no flare ups

## 2023-01-22 NOTE — Assessment & Plan Note (Signed)
Flat facial looks, on Escitalopram

## 2023-01-22 NOTE — Assessment & Plan Note (Signed)
on Simvastatin 

## 2023-01-22 NOTE — Assessment & Plan Note (Signed)
Compensated clinically, mild edema BLE, on Torsemide 40mg  qd since 12/21/22(was Torsemide 60mg  bid, off Spironolactone bid), EF 60-65%, CXR 12/08/22 no acute process.

## 2023-01-22 NOTE — Assessment & Plan Note (Signed)
Stable, taking Pantoprazole, Hgb 11.3 12/20/22

## 2023-01-22 NOTE — Assessment & Plan Note (Signed)
on Levothyroxine, TSH 2.36 11/23/22 

## 2023-01-22 NOTE — Assessment & Plan Note (Signed)
No further occurrence since admitted to SNF [facility],   Syncope 2/2 to hypotension related to multiple diuretics/antihypertensive agents, autonomic dysfunction.

## 2023-01-22 NOTE — Assessment & Plan Note (Signed)
No change,  on Doxazosin 

## 2023-01-22 NOTE — Assessment & Plan Note (Signed)
uses walker, w/c to go further, taking Tylenol  

## 2023-01-23 DIAGNOSIS — M6281 Muscle weakness (generalized): Secondary | ICD-10-CM | POA: Diagnosis not present

## 2023-01-23 DIAGNOSIS — R1312 Dysphagia, oropharyngeal phase: Secondary | ICD-10-CM | POA: Diagnosis not present

## 2023-01-23 DIAGNOSIS — R2689 Other abnormalities of gait and mobility: Secondary | ICD-10-CM | POA: Diagnosis not present

## 2023-01-24 DIAGNOSIS — R2689 Other abnormalities of gait and mobility: Secondary | ICD-10-CM | POA: Diagnosis not present

## 2023-01-24 DIAGNOSIS — M6281 Muscle weakness (generalized): Secondary | ICD-10-CM | POA: Diagnosis not present

## 2023-01-24 DIAGNOSIS — R1312 Dysphagia, oropharyngeal phase: Secondary | ICD-10-CM | POA: Diagnosis not present

## 2023-01-25 ENCOUNTER — Encounter: Payer: Self-pay | Admitting: Nurse Practitioner

## 2023-01-25 ENCOUNTER — Non-Acute Institutional Stay: Payer: Self-pay | Admitting: Nurse Practitioner

## 2023-01-25 DIAGNOSIS — R059 Cough, unspecified: Secondary | ICD-10-CM

## 2023-01-25 DIAGNOSIS — N1831 Chronic kidney disease, stage 3a: Secondary | ICD-10-CM | POA: Diagnosis not present

## 2023-01-25 DIAGNOSIS — R1312 Dysphagia, oropharyngeal phase: Secondary | ICD-10-CM | POA: Diagnosis not present

## 2023-01-25 DIAGNOSIS — I5022 Chronic systolic (congestive) heart failure: Secondary | ICD-10-CM

## 2023-01-25 DIAGNOSIS — J9811 Atelectasis: Secondary | ICD-10-CM | POA: Diagnosis not present

## 2023-01-25 DIAGNOSIS — I959 Hypotension, unspecified: Secondary | ICD-10-CM | POA: Diagnosis not present

## 2023-01-25 DIAGNOSIS — J90 Pleural effusion, not elsewhere classified: Secondary | ICD-10-CM | POA: Diagnosis not present

## 2023-01-25 DIAGNOSIS — R918 Other nonspecific abnormal finding of lung field: Secondary | ICD-10-CM | POA: Diagnosis not present

## 2023-01-25 DIAGNOSIS — R4189 Other symptoms and signs involving cognitive functions and awareness: Secondary | ICD-10-CM | POA: Diagnosis not present

## 2023-01-25 DIAGNOSIS — E039 Hypothyroidism, unspecified: Secondary | ICD-10-CM | POA: Diagnosis not present

## 2023-01-25 DIAGNOSIS — F339 Major depressive disorder, recurrent, unspecified: Secondary | ICD-10-CM

## 2023-01-25 DIAGNOSIS — M6281 Muscle weakness (generalized): Secondary | ICD-10-CM | POA: Diagnosis not present

## 2023-01-25 DIAGNOSIS — R1310 Dysphagia, unspecified: Secondary | ICD-10-CM | POA: Insufficient documentation

## 2023-01-25 DIAGNOSIS — R2689 Other abnormalities of gait and mobility: Secondary | ICD-10-CM | POA: Diagnosis not present

## 2023-01-25 DIAGNOSIS — R350 Frequency of micturition: Secondary | ICD-10-CM | POA: Diagnosis not present

## 2023-01-25 DIAGNOSIS — J9809 Other diseases of bronchus, not elsewhere classified: Secondary | ICD-10-CM | POA: Diagnosis not present

## 2023-01-25 NOTE — Assessment & Plan Note (Signed)
on Doxazosin, chronic

## 2023-01-25 NOTE — Assessment & Plan Note (Signed)
on Donepezil, Memantine, MMSE 2/30 12/09/22 

## 2023-01-25 NOTE — Assessment & Plan Note (Signed)
Bun/creat 24/0.87 12/20/22 

## 2023-01-25 NOTE — Assessment & Plan Note (Signed)
Nectar thick.

## 2023-01-25 NOTE — Assessment & Plan Note (Addendum)
reported wheezing, x1 DuoNeb was effective, noted congestive cough, moist rales bibasilar, weight gained about #4Ibs in the past week. No O2 desaturation, he is afebrile. Hx of cough with swallow, ST evaluated.   Hypotension, persisted, taking reduced dose of Torsemide CXR ap/lateral DuoNeb q8hr x 72 hours, Mucinex 600mg  bid x 3 days, Toresmide 40mg  x1 stat, daily weight x 3 days.  Update CBC/diff, CMP/eGFR, BNP  ST recommended nectar thick liquid CXR underlying emphysema.  Will try DuoNeb bid after initial q8hr x3 days, Zpk for congestive cough in setting of risk for aspiration and underlying emphysema.

## 2023-01-25 NOTE — Assessment & Plan Note (Signed)
Hospitalized 12/08/22-12/13/22 for syncope work up, fell backward while brushing teeth, resulted a hematoma back of head-staples removal 12/17/22,  2/2 to hypotension, multiple factorials: multiple diuretics/antihypertensive agents, autonomic dysfunction. Resolved after fluid resuscitation.   Hypotension, persisted, taking reduced dose of Torsemide

## 2023-01-25 NOTE — Assessment & Plan Note (Signed)
His mood is stable, on Escitalopram

## 2023-01-25 NOTE — Progress Notes (Addendum)
Location:  [facility] Room Number: NO/61/A Place of Service:  SNF (31) Provider:  Mynor Witkop X, NP  Patient Care Team: Mahlon Gammon, MD as PCP - General (Internal Medicine) Bensimhon, Bevelyn Buckles, MD as PCP - Advanced Heart Failure (Cardiology) Georgann Housekeeper, MD as Consulting Physician (Internal Medicine)  Extended Emergency Contact Information Primary Emergency Contact: Schall,[name] Mobile Phone: [phone] Relation: Son Secondary Emergency Contact: Thurgood,[name] Mobile Phone: [phone] Relation: Son  Code Status:  DNR Goals of care: Advanced Directive information    01/21/2023   10:00 AM  Advanced Directives  Does Patient Have a Medical Advance Directive? Yes  Type of Estate agent of [place];Living will;Out of facility DNR (pink MOST or yellow form)  Does patient want to make changes to medical advance directive? No - Patient declined  Copy of Healthcare Power of Attorney in Chart? Yes - validated most recent copy scanned in chart (See row information)  Pre-existing out of facility DNR order (yellow form or pink MOST form) Pink MOST form placed in chart (order not valid for inpatient use);Yellow form placed in chart (order not valid for inpatient use)     Chief Complaint  Patient presents with   Acute Visit    Patient is being seen for weezing    HPI:  Pt is a 89 y.o. male seen today for an acute visit for reported wheezing, x1 DuoNeb was effective, noted congestive cough, moist rales bibasilar, weight gained about #4Ibs in the past week. No O2 desaturation, he is afebrile.      Hospitalized 12/08/22-12/13/22 for syncope work up, fell backward while brushing teeth, resulted a hematoma back of head-staples removal 12/17/22,  2/2 to hypotension, multiple factorials: multiple diuretics/antihypertensive agents, autonomic dysfunction. Resolved after fluid resuscitation.   Hypotension, persisted, taking reduced dose of  Torsemide             Cough associated with swallowing             CKD Bun/creat 24/0.87 12/20/22             Syncope 2/2 to hypotension related to multiple diuretics/antihypertensive agents, autonomic dysfunction.              Hypokalemia, Kcl, K 4.0 12/20/22             Dementia, on Donepezil, Memantine, MMSE 2/30 12/09/22             TIA, Hx of             Systolic CHF, mild edema BLE, on Torsemide 40mg  qd since 12/21/22(was Torsemide 60mg  bid, off Spironolactone bid), EF 60-65%, CXR 12/08/22 no acute process.              GERD, taking Pantoprazole, Hgb 11.3 12/20/22             HLD, on Simvastatin             Gout, taking Allopurinol             Urinary frequency, on Doxazosin             Depression, on Escitalopram             Hypothyroidism, on Levothyroxine, TSH 2.36 11/23/22             OA, uses walker, w/c to go further, taking Tylenol         Past Medical History:  Diagnosis Date   Actinic keratoses    Arthritis  CHF (congestive heart failure) (HCC)    Chronic bilateral pleural effusions    Chronic sinusitis    Colon polyps    Dyspnea 07/13/2013   BNP normal   Edema 07/13/2013   GERD (gastroesophageal reflux disease)    omeprazole 1-2 times daily   H/O TIA (transient ischemic attack) and stroke    on statins and plavix -saw Dr.Sethi in the past   Headache    History of BPH    History of TIA (transient ischemic attack) 07/13/2013   Left inguinal hernia    Left inguinal hernia 01/27/2019   Lumbar radiculopathy    Need for shingles vaccine    Optic neuritis 10/2015   left eye   Perennial allergic rhinitis    Polymyalgia rheumatica (HCC) 07/13/2013   Daily prednisone   Restless leg syndrome    Retinal disease    right eye since birth    Shingles    Temporal arteritis (HCC) 10/2015   Wears glasses    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   Past Surgical History:  Procedure Laterality Date   BRAVO PH STUDY N/A 04/18/2016   Procedure: BRAVO PH STUDY;  Surgeon: Charlott Rakes, MD;  Location: [facility];  Service: Endoscopy;  Laterality: N/A;   CHEST TUBE INSERTION Right 02/13/2018   Procedure: INSERTION PLEURAL DRAINAGE CATHETER;  Surgeon: Kerin Perna, MD;  Location: [facility] OR;  Service: Thoracic;  Laterality: Right;   CHEST TUBE INSERTION Left 11/27/2019   Procedure: REDO INSERTION PLEURAL DRAINAGE CATHETER USING 15.5 FR. PLEURX CATH;  Surgeon: Donata Clay, Theron Arista, MD;  Location: [facility] OR;  Service: Thoracic;  Laterality: Left;   COLONOSCOPY     drropy eyelid surgery     ESOPHAGOGASTRODUODENOSCOPY (EGD) WITH PROPOFOL N/A 04/18/2016   Procedure: ESOPHAGOGASTRODUODENOSCOPY (EGD) WITH PROPOFOL;  Surgeon: Charlott Rakes, MD;  [facility];  Service: Endoscopy;  Laterality: N/A;   INGUINAL HERNIA REPAIR Left 01/27/2019   Procedure: OPEN REPAIR LEFT INGUINAL HERNIA WITH MESH;  Surgeon: Claud Kelp, MD;  Location: [facility] OR;  Service: General;  Laterality: Left;  GENERAL AND TAP BLOCK ANESTHESIA   INSERTION OF MESH Left 01/27/2019   Procedure: Insertion Of Mesh;  Surgeon: Claud Kelp, MD;  Location: [facility] OR;  Service: General;  Laterality: Left;   IR THORACENTESIS ASP PLEURAL SPACE W/IMG GUIDE  02/04/2018   IR THORACENTESIS ASP PLEURAL SPACE W/IMG GUIDE  02/06/2018   REMOVAL OF PLEURAL DRAINAGE CATHETER Left 11/27/2019   Procedure: REMOVAL OF PLEURAL DRAINAGE CATHETER;  Surgeon: Kerin Perna, MD;  Location: [facility] OR;  Service: Thoracic;  Laterality: Left;   REMOVAL OF PLEURAL DRAINAGE CATHETER Left 03/31/2020   Procedure: REMOVAL OF PLEURAL DRAINAGE CATHETER;  Surgeon: Kerin Perna, MD;  Location: [facility] OR;  Service: Thoracic;  Laterality: Left;   RIGHT/LEFT HEART CATH AND CORONARY ANGIOGRAPHY N/A 06/24/2018   Procedure: RIGHT/LEFT HEART CATH AND CORONARY ANGIOGRAPHY;  Surgeon: Dolores Patty, MD;  [facility]: [facility];  Service: Cardiovascular;  Laterality: N/A;   VASECTOMY      No Known Allergies  Outpatient Encounter Medications as of 01/25/2023   Medication Sig   acetaminophen (TYLENOL) 500 MG tablet Take 2 tablets (1,000 mg total) by mouth in the morning and at bedtime.   allopurinol (ZYLOPRIM) 300 MG tablet Take 150 mg by mouth in the morning. For gout   aspirin 81 MG chewable tablet Chew 81 mg by mouth in the morning. Chew and swallow tablet   donepezil (ARICEPT ODT) 5 MG disintegrating  tablet Take 1 tablet (5 mg total) by mouth at bedtime. For co++ + (Patient taking differently: Take 5 mg by mouth at bedtime.)   doxazosin (CARDURA) 4 MG tablet Take 4 mg by mouth daily.   escitalopram (LEXAPRO) 10 MG tablet Take 1 tablet (10 mg total) by mouth daily.   levothyroxine (SYNTHROID) 25 MCG tablet Take 25 mcg by mouth daily before breakfast.   memantine (NAMENDA) 10 MG tablet TAKE 1 TABLET BY MOUTH TWICE  DAILY   pantoprazole (PROTONIX) 20 MG tablet Take 20 mg by mouth daily before breakfast.   potassium chloride SA (KLOR-CON M) 20 MEQ tablet Take 40 mEq by mouth daily.   simvastatin (ZOCOR) 20 MG tablet Take 20 mg by mouth every evening.   torsemide (DEMADEX) 20 MG tablet TAKE 3 TABLETS BY MOUTH IN THE  MORNING AND 2 TABLETS BY MOUTH  IN THE EVENING   No facility-administered encounter medications on file as of 01/25/2023.    Review of Systems  Unable to perform ROS: Dementia    Immunization History  Administered Date(s) Administered   Fluad Quad(high Dose 65+) 04/27/2021, 05/16/2022   Influenza, High Dose Seasonal PF 04/15/2017, 05/16/2018   Influenza,inj,Quad PF,6+ Mos 04/23/2015   Moderna SARS-COV2 Booster Vaccination 12/28/2020, 04/20/2021   Moderna Sars-Covid-2 Vaccination 07/26/2019, 08/26/2019, 06/06/2020   PPD Test 12/13/2022   Pneumococcal Conjugate-13 02/14/2016, 07/23/2016   Pneumococcal Polysaccharide-23 11/13/2012   Td 01/29/2006   Tdap 08/07/2018   Zoster Recombinant(Shingrix) 02/18/2019   Zoster, Live 02/18/2019, 07/01/2019   Pertinent  Health Maintenance Due  Topic Date Due   INFLUENZA VACCINE  02/21/2023       06/25/2022    2:44 AM 08/08/2022   10:15 AM 12/14/2022   10:26 AM 12/25/2022   10:14 AM 01/21/2023   10:00 AM  Fall Risk  Falls in the past year?  0 1 1 1   Was there an injury with Fall?  0 1 1 1   Fall Risk Category Calculator  0 3 3 3   (RETIRED) Patient Fall Risk Level High fall risk      Patient at Risk for Falls Due to  No Fall Risks History of fall(s);Impaired balance/gait;Impaired mobility;Impaired vision History of fall(s);Impaired balance/gait;Impaired mobility;Impaired vision History of fall(s);Impaired balance/gait;Impaired mobility  Fall risk Follow up  Falls evaluation completed Falls prevention discussed;Falls evaluation completed Falls evaluation completed Falls evaluation completed   Functional Status Survey:    Vitals:   01/25/23 1018  BP: (!) 109/53  Pulse: 67  Resp: 18  Temp: 97.8 F (36.6 C)  Weight: 145 lb 11.2 oz (66.1 kg)  Height: 5\' 9"  (1.753 m)   Body mass index is 21.52 kg/m. Physical Exam Vitals and nursing note reviewed.  Constitutional:      Appearance: Normal appearance.  HENT:     Head: Normocephalic.     Nose: Nose normal.     Mouth/Throat:     Mouth: Mucous membranes are moist.  Eyes:     Extraocular Movements: Extraocular movements intact.     Conjunctiva/sclera: Conjunctivae normal.     Pupils: Pupils are equal, round, and reactive to light.  Cardiovascular:     Rate and Rhythm: Normal rate and regular rhythm.     Heart sounds: Murmur heard.  Pulmonary:     Effort: Pulmonary effort is normal.     Breath sounds: Wheezing and rales present. No rhonchi.     Comments: Congestive cough, bibasilar rales.  Abdominal:     General: Bowel sounds are normal.  Palpations: Abdomen is soft.     Tenderness: There is no abdominal tenderness.     Hernia: A hernia is present.     Comments: Hx of left inguinal hernia.   Musculoskeletal:     Cervical back: Normal range of motion and neck supple.     Right lower leg: Edema present.     Left  lower leg: Edema present.     Comments: 1+ edema BLE  Skin:    General: Skin is warm and dry.  Neurological:     General: No focal deficit present.     Mental Status: He is alert. Mental status is at baseline.     Coordination: Coordination normal.     Gait: Gait abnormal.     Comments: Oriented to person  Psychiatric:        Mood and Affect: Mood normal.        Behavior: Behavior normal.     Comments: Smiled during today's visit.      Labs reviewed: Recent Labs    12/08/22 0920 12/08/22 1135 12/08/22 1559 12/08/22 2145 12/09/22 0220 12/10/22 0711  NA 134*  --   --   --  135 144  K 2.6*  --   --  3.7 3.3* 4.4  CL 96*  --   --   --  103 114*  CO2 25  --   --   --  24 24  GLUCOSE 107*  --   --   --  106* 100*  BUN 51*  --   --   --  34* 24*  CREATININE 1.48*  --   --   --  1.15 0.99  CALCIUM 8.6*  --   --   --  8.3* 8.1*  MG  --  2.4  --   --   --   --   PHOS  --   --  3.4  --   --   --    Recent Labs    11/23/22 0000 12/08/22 0920 12/10/22 0711  AST 15 23 17   ALT 11 13 12   ALKPHOS 59 53 45  BILITOT  --  0.5 0.4  PROT  --  6.5 5.6*  ALBUMIN 3.9 3.3* 2.6*   Recent Labs    06/25/22 1330 11/23/22 0000 12/08/22 0920 12/10/22 0711  WBC 10.4 8.8 10.6* 9.1  NEUTROABS 7,124.00 4,409.00 5.8  --   HGB 12.5* 13.1* 12.7* 11.5*  HCT 37* 40* 38.0* 35.8*  MCV  --   --  99.5 100.3*  PLT 403* 334 334 314   Lab Results  Component Value Date   TSH 2.36 11/23/2022   No results found for: "HGBA1C" Lab Results  Component Value Date   CHOL 121 11/23/2022   HDL 48 11/23/2022   LDLCALC 50 11/23/2022   TRIG 144 11/23/2022    Significant Diagnostic Results in last 30 days:  No results found.  Assessment/Plan Cough in adult reported wheezing, x1 DuoNeb was effective, noted congestive cough, moist rales bibasilar, weight gained about #4Ibs in the past week. No O2 desaturation, he is afebrile. Hx of cough with swallow, ST evaluated.   Hypotension, persisted, taking  reduced dose of Torsemide CXR ap/lateral DuoNeb q8hr x 72 hours, Mucinex 600mg  bid x 3 days, Toresmide 40mg  x1 stat, daily weight x 3 days.  Update CBC/diff, CMP/eGFR, BNP  ST recommended nectar thick liquid CXR underlying emphysema.  Will try DuoNeb bid after initial q8hr x3 days, Zpk for congestive cough in  setting of risk for aspiration and underlying emphysema.   Systolic CHF (HCC) mild edema BLE, but weight gained about #4Ibs in the past week, noted wheezing and congestive cough, on Torsemide 40mg  qd since 12/21/22(was Torsemide 60mg  bid, off Spironolactone bid), EF 60-65%, CXR 12/08/22 no acute process.  Repeat CXR, extra dose of Torsemide stat.   Hypotension  Hospitalized 12/08/22-12/13/22 for syncope work up, fell backward while brushing teeth, resulted a hematoma back of head-staples removal 12/17/22,  2/2 to hypotension, multiple factorials: multiple diuretics/antihypertensive agents, autonomic dysfunction. Resolved after fluid resuscitation.   Hypotension, persisted, taking reduced dose of Torsemide  CKD (chronic kidney disease) stage 3, GFR 30-59 ml/min (HCC) Bun/creat 24/0.87 12/20/22  Cognitive impairment on Donepezil, Memantine, MMSE 2/30 12/09/22  Urinary frequency  on Doxazosin, chronic  Recurrent depression (HCC) His mood is stable, on Escitalopram  Hypothyroidism  on Levothyroxine, TSH 2.36 11/23/22  Dysphagia Nectar thick.      Family/ staff Communication: plan of care reviewed with the patient, and patient's son HPOA, and charge nurse  Labs/tests ordered:  CXR ap/lateral, CBC/diff, CMP/eGFR, BNP  Time spend 35 minutes.

## 2023-01-25 NOTE — Assessment & Plan Note (Signed)
mild edema BLE, but weight gained about #4Ibs in the past week, noted wheezing and congestive cough, on Torsemide 40mg  qd since 12/21/22(was Torsemide 60mg  bid, off Spironolactone bid), EF 60-65%, CXR 12/08/22 no acute process.  Repeat CXR, extra dose of Torsemide stat.

## 2023-01-25 NOTE — Assessment & Plan Note (Signed)
on Levothyroxine, TSH 2.36 11/23/22 

## 2023-01-25 NOTE — Progress Notes (Signed)
This encounter was created in error - please disregard.

## 2023-01-28 DIAGNOSIS — R2689 Other abnormalities of gait and mobility: Secondary | ICD-10-CM | POA: Diagnosis not present

## 2023-01-28 DIAGNOSIS — M6281 Muscle weakness (generalized): Secondary | ICD-10-CM | POA: Diagnosis not present

## 2023-01-28 DIAGNOSIS — R1312 Dysphagia, oropharyngeal phase: Secondary | ICD-10-CM | POA: Diagnosis not present

## 2023-01-29 DIAGNOSIS — R2689 Other abnormalities of gait and mobility: Secondary | ICD-10-CM | POA: Diagnosis not present

## 2023-01-29 DIAGNOSIS — M6281 Muscle weakness (generalized): Secondary | ICD-10-CM | POA: Diagnosis not present

## 2023-01-29 DIAGNOSIS — R1312 Dysphagia, oropharyngeal phase: Secondary | ICD-10-CM | POA: Diagnosis not present

## 2023-01-29 LAB — CBC AND DIFFERENTIAL
HCT: 36 — AB (ref 41–53)
Hemoglobin: 11.5 — AB (ref 13.5–17.5)
Neutrophils Absolute: 2870
Platelets: 328 10*3/uL (ref 150–400)
WBC: 6.9

## 2023-01-29 LAB — COMPREHENSIVE METABOLIC PANEL
Albumin: 2.8 — AB (ref 3.5–5.0)
Calcium: 8 — AB (ref 8.7–10.7)
Globulin: 2.4

## 2023-01-29 LAB — BASIC METABOLIC PANEL
BUN: 24 — AB (ref 4–21)
CO2: 27 — AB (ref 13–22)
Chloride: 106 (ref 99–108)
Creatinine: 1.1 (ref 0.6–1.3)
Glucose: 86
Potassium: 3.7 mEq/L (ref 3.5–5.1)
Sodium: 140 (ref 137–147)

## 2023-01-29 LAB — HEPATIC FUNCTION PANEL
ALT: 5 U/L — AB (ref 10–40)
AST: 13 — AB (ref 14–40)
Alkaline Phosphatase: 53 (ref 25–125)

## 2023-01-29 LAB — CBC: RBC: 3.54 — AB (ref 3.87–5.11)

## 2023-01-30 DIAGNOSIS — M6281 Muscle weakness (generalized): Secondary | ICD-10-CM | POA: Diagnosis not present

## 2023-01-30 DIAGNOSIS — R2689 Other abnormalities of gait and mobility: Secondary | ICD-10-CM | POA: Diagnosis not present

## 2023-01-30 DIAGNOSIS — R1312 Dysphagia, oropharyngeal phase: Secondary | ICD-10-CM | POA: Diagnosis not present

## 2023-01-31 ENCOUNTER — Other Ambulatory Visit: Payer: Self-pay | Admitting: Adult Health

## 2023-01-31 DIAGNOSIS — M6281 Muscle weakness (generalized): Secondary | ICD-10-CM | POA: Diagnosis not present

## 2023-01-31 DIAGNOSIS — R1312 Dysphagia, oropharyngeal phase: Secondary | ICD-10-CM | POA: Diagnosis not present

## 2023-01-31 DIAGNOSIS — R2689 Other abnormalities of gait and mobility: Secondary | ICD-10-CM | POA: Diagnosis not present

## 2023-02-01 DIAGNOSIS — R1312 Dysphagia, oropharyngeal phase: Secondary | ICD-10-CM | POA: Diagnosis not present

## 2023-02-01 DIAGNOSIS — R2689 Other abnormalities of gait and mobility: Secondary | ICD-10-CM | POA: Diagnosis not present

## 2023-02-01 DIAGNOSIS — M6281 Muscle weakness (generalized): Secondary | ICD-10-CM | POA: Diagnosis not present

## 2023-02-05 DIAGNOSIS — M6281 Muscle weakness (generalized): Secondary | ICD-10-CM | POA: Diagnosis not present

## 2023-02-05 DIAGNOSIS — R1312 Dysphagia, oropharyngeal phase: Secondary | ICD-10-CM | POA: Diagnosis not present

## 2023-02-05 DIAGNOSIS — R2689 Other abnormalities of gait and mobility: Secondary | ICD-10-CM | POA: Diagnosis not present

## 2023-02-06 DIAGNOSIS — M6281 Muscle weakness (generalized): Secondary | ICD-10-CM | POA: Diagnosis not present

## 2023-02-06 DIAGNOSIS — R1312 Dysphagia, oropharyngeal phase: Secondary | ICD-10-CM | POA: Diagnosis not present

## 2023-02-06 DIAGNOSIS — R2689 Other abnormalities of gait and mobility: Secondary | ICD-10-CM | POA: Diagnosis not present

## 2023-02-11 ENCOUNTER — Non-Acute Institutional Stay (SKILLED_NURSING_FACILITY): Payer: Medicare Other | Admitting: Orthopedic Surgery

## 2023-02-11 ENCOUNTER — Encounter: Payer: Self-pay | Admitting: Orthopedic Surgery

## 2023-02-11 DIAGNOSIS — J90 Pleural effusion, not elsewhere classified: Secondary | ICD-10-CM

## 2023-02-11 NOTE — Progress Notes (Unsigned)
Location:   [facility] Room Number: 61-A Place of Service:  SNF [phone]) Provider:  Hazle Nordmann, NP  PCP: Mahlon Gammon, MD  Patient Care Team: Mahlon Gammon, MD as PCP - General (Internal Medicine) Bensimhon, Bevelyn Buckles, MD as PCP - Advanced Heart Failure (Cardiology) Georgann Housekeeper, MD as Consulting Physician (Internal Medicine)  Extended Emergency Contact Information Primary Emergency Contact: Weseman,[name] Mobile Phone: [phone] Relation: Son Secondary Emergency Contact: Mastrogiovanni,[name] Mobile Phone: [phone] Relation: Son  Code Status:  DNR Goals of care: Advanced Directive information    02/11/2023    2:09 PM  Advanced Directives  Does Patient Have a Medical Advance Directive? Yes  Type of Estate agent of [place];Living will;Out of facility DNR (pink MOST or yellow form)  Does patient want to make changes to medical advance directive? No - Patient declined  Copy of Healthcare Power of Attorney in Chart? Yes - validated most recent copy scanned in chart (See row information)     Chief Complaint  Patient presents with   Acute Visit    Cough    HPI:  Pt is a 89 y.o. male seen today for an acute visit for    Past Medical History:  Diagnosis Date   Actinic keratoses    Arthritis    CHF (congestive heart failure) (HCC)    Chronic bilateral pleural effusions    Chronic sinusitis    Colon polyps    Dyspnea 07/13/2013   BNP normal   Edema 07/13/2013   GERD (gastroesophageal reflux disease)    omeprazole 1-2 times daily   H/O TIA (transient ischemic attack) and stroke    on statins and plavix -saw Dr.Sethi in the past   Headache    History of BPH    History of TIA (transient ischemic attack) 07/13/2013   Left inguinal hernia    Left inguinal hernia 01/27/2019   Lumbar radiculopathy    Need for shingles vaccine    Optic neuritis 10/2015   left eye   Perennial allergic rhinitis    Polymyalgia rheumatica  (HCC) 07/13/2013   Daily prednisone   Restless leg syndrome    Retinal disease    right eye since birth    Shingles    Temporal arteritis (HCC) 10/2015   Wears glasses    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   Past Surgical History:  Procedure Laterality Date   BRAVO PH STUDY N/A 04/18/2016   Procedure: BRAVO PH STUDY;  Surgeon: Charlott Rakes, MD;  [facility]: [facility];  Service: Endoscopy;  Laterality: N/A;   CHEST TUBE INSERTION Right 02/13/2018   Procedure: INSERTION PLEURAL DRAINAGE CATHETER;  Surgeon: Kerin Perna, MD;  Location: [facility] OR;  Service: Thoracic;  Laterality: Right;   CHEST TUBE INSERTION Left 11/27/2019   Procedure: REDO INSERTION PLEURAL DRAINAGE CATHETER USING 15.5 FR. PLEURX CATH;  Surgeon: Donata Clay, Theron Arista, MD;  Location: [facility] OR;  Service: Thoracic;  Laterality: Left;   COLONOSCOPY     drropy eyelid surgery     ESOPHAGOGASTRODUODENOSCOPY (EGD) WITH PROPOFOL N/A 04/18/2016   Procedure: ESOPHAGOGASTRODUODENOSCOPY (EGD) WITH PROPOFOL;  Surgeon: Charlott Rakes, MD;  [facility];  Service: Endoscopy;  Laterality: N/A;   INGUINAL HERNIA REPAIR Left 01/27/2019   Procedure: OPEN REPAIR LEFT INGUINAL HERNIA WITH MESH;  Surgeon: Claud Kelp, MD;  Location: [facility] OR;  Service: General;  Laterality: Left;  GENERAL AND TAP BLOCK ANESTHESIA   INSERTION OF MESH Left 01/27/2019   Procedure: Insertion Of  Mesh;  Surgeon: Claud Kelp, MD;  Location: [facility] OR;  Service: General;  Laterality: Left;   IR THORACENTESIS ASP PLEURAL SPACE W/IMG GUIDE  02/04/2018   IR THORACENTESIS ASP PLEURAL SPACE W/IMG GUIDE  02/06/2018   REMOVAL OF PLEURAL DRAINAGE CATHETER Left 11/27/2019   Procedure: REMOVAL OF PLEURAL DRAINAGE CATHETER;  Surgeon: Kerin Perna, MD;  Location: [facility] OR;  Service: Thoracic;  Laterality: Left;   REMOVAL OF PLEURAL DRAINAGE CATHETER Left 03/31/2020   Procedure: REMOVAL OF PLEURAL DRAINAGE CATHETER;  Surgeon: Kerin Perna, MD;  Location: [facility] OR;  Service: Thoracic;   Laterality: Left;   RIGHT/LEFT HEART CATH AND CORONARY ANGIOGRAPHY N/A 06/24/2018   Procedure: RIGHT/LEFT HEART CATH AND CORONARY ANGIOGRAPHY;  Surgeon: Dolores Patty, MD;  [facility];  Service: Cardiovascular;  Laterality: N/A;   VASECTOMY      No Known Allergies  Allergies as of 02/11/2023   No Known Allergies      Medication List        Accurate as of February 11, 2023  2:10 PM. If you have any questions, ask your nurse or doctor.          acetaminophen 500 MG tablet Commonly known as: TYLENOL Take 2 tablets (1,000 mg total) by mouth in the morning and at bedtime.   allopurinol 300 MG tablet Commonly known as: ZYLOPRIM Take 150 mg by mouth in the morning. For gout   aspirin 81 MG chewable tablet Chew 81 mg by mouth in the morning. Chew and swallow tablet   Calcium 600 600 MG Tabs tablet Generic drug: calcium carbonate Take 600 mg by mouth 2 (two) times daily.   donepezil 5 MG disintegrating tablet Commonly known as: ARICEPT ODT Take 1 tablet (5 mg total) by mouth at bedtime. For co++ +   doxazosin 4 MG tablet Commonly known as: CARDURA Take 4 mg by mouth daily.   escitalopram 10 MG tablet Commonly known as: LEXAPRO TAKE 1 TABLET BY MOUTH DAILY   ipratropium-albuterol 0.5-2.5 (3) MG/3ML Soln Commonly known as: DUONEB Take 3 mLs by nebulization in the morning and at bedtime.   levothyroxine 25 MCG tablet Commonly known as: SYNTHROID Take 25 mcg by mouth daily before breakfast.   memantine 10 MG tablet Commonly known as: NAMENDA TAKE 1 TABLET BY MOUTH TWICE  DAILY   pantoprazole 20 MG tablet Commonly known as: PROTONIX Take 20 mg by mouth daily.   potassium chloride 10 MEQ tablet Commonly known as: KLOR-CON Take 10 mEq by mouth 2 (two) times a week. Monday & Tuesday   potassium chloride SA 20 MEQ tablet Commonly known as: KLOR-CON M Take 40 mEq by mouth daily.   simvastatin 20 MG tablet Commonly known as: ZOCOR Take 20 mg  by mouth every evening.   Torsemide 40 MG Tabs Take 1 tablet by mouth daily. What changed: Another medication with the same name was removed. Continue taking this medication, and follow the directions you see here. Changed by: Octavia Heir   Torsemide 40 MG Tabs Take 1 tablet by mouth 2 (two) times a week. Monday & Tuesday What changed: Another medication with the same name was removed. Continue taking this medication, and follow the directions you see here. Changed by: Octavia Heir   Vitamin D3 50 MCG (2000 UT) Tabs Take 1 tablet by mouth daily.        Review of Systems  Immunization History  Administered Date(s) Administered   Fluad Quad(high Dose 65+)  04/27/2021, 05/16/2022   Influenza, High Dose Seasonal PF 04/15/2017, 05/16/2018   Influenza,inj,Quad PF,6+ Mos 04/23/2015   Moderna SARS-COV2 Booster Vaccination 12/28/2020, 04/20/2021   Moderna Sars-Covid-2 Vaccination 07/26/2019, 08/26/2019, 06/06/2020   PPD Test 12/13/2022   Pneumococcal Conjugate-13 02/14/2016, 07/23/2016   Pneumococcal Polysaccharide-23 11/13/2012   Td 01/29/2006   Tdap 08/07/2018   Zoster Recombinant(Shingrix) 02/18/2019   Zoster, Live 02/18/2019, 07/01/2019   Pertinent  Health Maintenance Due  Topic Date Due   INFLUENZA VACCINE  02/21/2023      06/25/2022    2:44 AM 08/08/2022   10:15 AM 12/14/2022   10:26 AM 12/25/2022   10:14 AM 01/21/2023   10:00 AM  Fall Risk  Falls in the past year?  0 1 1 1   Was there an injury with Fall?  0 1 1 1   Fall Risk Category Calculator  0 3 3 3   (RETIRED) Patient Fall Risk Level High fall risk      Patient at Risk for Falls Due to  No Fall Risks History of fall(s);Impaired balance/gait;Impaired mobility;Impaired vision History of fall(s);Impaired balance/gait;Impaired mobility;Impaired vision History of fall(s);Impaired balance/gait;Impaired mobility  Fall risk Follow up  Falls evaluation completed Falls prevention discussed;Falls evaluation completed Falls  evaluation completed Falls evaluation completed   Functional Status Survey:    Vitals:   02/11/23 1352  BP: (!) 79/42  Pulse: 90  Resp: 19  Temp: 97.8 F (36.6 C)  SpO2: 96%  Weight: 143 lb (64.9 kg)  Height: 5\' 9"  (1.753 m)   Body mass index is 21.12 kg/m. Physical Exam  Labs reviewed: Recent Labs    12/08/22 0920 12/08/22 1135 12/08/22 1559 12/08/22 2145 12/09/22 0220 12/10/22 0711 01/29/23 0000  NA 134*  --   --   --  135 144 140  K 2.6*  --   --    < > 3.3* 4.4 3.7  CL 96*  --   --   --  103 114* 106  CO2 25  --   --   --  24 24 27*  GLUCOSE 107*  --   --   --  106* 100*  --   BUN 51*  --   --   --  34* 24* 24*  CREATININE 1.48*  --   --   --  1.15 0.99 1.1  CALCIUM 8.6*  --   --   --  8.3* 8.1* 8.0*  MG  --  2.4  --   --   --   --   --   PHOS  --   --  3.4  --   --   --   --    < > = values in this interval not displayed.   Recent Labs    12/08/22 0920 12/10/22 0711 01/29/23 0000  AST 23 17 13*  ALT 13 12 5*  ALKPHOS 53 45 53  BILITOT 0.5 0.4  --   PROT 6.5 5.6*  --   ALBUMIN 3.3* 2.6* 2.8*   Recent Labs    11/23/22 0000 12/08/22 0920 12/10/22 0711 01/29/23 0000  WBC 8.8 10.6* 9.1 6.9  NEUTROABS 4,409.00 5.8  --  2,870.00  HGB 13.1* 12.7* 11.5* 11.5*  HCT 40* 38.0* 35.8* 36*  MCV  --  99.5 100.3*  --   PLT 334 334 314 328   Lab Results  Component Value Date   TSH 2.36 11/23/2022   No results found for: "HGBA1C" Lab Results  Component Value Date   CHOL  121 11/23/2022   HDL 48 11/23/2022   LDLCALC 50 11/23/2022   TRIG 144 11/23/2022    Significant Diagnostic Results in last 30 days:  No results found.  Assessment/Plan There are no diagnoses linked to this encounter.   Family/ staff Communication:   Labs/tests ordered:

## 2023-02-12 ENCOUNTER — Encounter: Payer: Self-pay | Admitting: Orthopedic Surgery

## 2023-02-13 DIAGNOSIS — R1312 Dysphagia, oropharyngeal phase: Secondary | ICD-10-CM | POA: Diagnosis not present

## 2023-02-13 DIAGNOSIS — M6281 Muscle weakness (generalized): Secondary | ICD-10-CM | POA: Diagnosis not present

## 2023-02-13 DIAGNOSIS — R2689 Other abnormalities of gait and mobility: Secondary | ICD-10-CM | POA: Diagnosis not present

## 2023-02-14 DIAGNOSIS — N189 Chronic kidney disease, unspecified: Secondary | ICD-10-CM | POA: Diagnosis not present

## 2023-02-14 DIAGNOSIS — M6281 Muscle weakness (generalized): Secondary | ICD-10-CM | POA: Diagnosis not present

## 2023-02-14 DIAGNOSIS — I10 Essential (primary) hypertension: Secondary | ICD-10-CM | POA: Diagnosis not present

## 2023-02-14 DIAGNOSIS — R1312 Dysphagia, oropharyngeal phase: Secondary | ICD-10-CM | POA: Diagnosis not present

## 2023-02-14 DIAGNOSIS — R2689 Other abnormalities of gait and mobility: Secondary | ICD-10-CM | POA: Diagnosis not present

## 2023-02-14 LAB — BASIC METABOLIC PANEL
BUN: 23 — AB (ref 4–21)
CO2: 28 — AB (ref 13–22)
Chloride: 107 (ref 99–108)
Creatinine: 1 (ref 0.6–1.3)
Glucose: 90
Potassium: 3.5 mEq/L (ref 3.5–5.1)
Sodium: 141 (ref 137–147)

## 2023-02-14 LAB — COMPREHENSIVE METABOLIC PANEL
Albumin: 2.9 — AB (ref 3.5–5.0)
Calcium: 8.1 — AB (ref 8.7–10.7)
Globulin: 2.3
eGFR: 71

## 2023-02-14 LAB — HEPATIC FUNCTION PANEL
ALT: 7 U/L — AB (ref 10–40)
AST: 13 — AB (ref 14–40)
Alkaline Phosphatase: 54 (ref 25–125)
Bilirubin, Total: 0.4

## 2023-02-14 LAB — LIPID PANEL
Cholesterol: 99 (ref 0–200)
HDL: 44 (ref 35–70)
LDL Cholesterol: 37
LDl/HDL Ratio: 2.3

## 2023-02-14 LAB — TSH: TSH: 2.84 (ref 0.41–5.90)

## 2023-02-18 ENCOUNTER — Non-Acute Institutional Stay: Payer: Self-pay | Admitting: Nurse Practitioner

## 2023-02-18 ENCOUNTER — Encounter: Payer: Self-pay | Admitting: Nurse Practitioner

## 2023-02-18 DIAGNOSIS — J90 Pleural effusion, not elsewhere classified: Secondary | ICD-10-CM

## 2023-02-18 DIAGNOSIS — E039 Hypothyroidism, unspecified: Secondary | ICD-10-CM

## 2023-02-18 DIAGNOSIS — F339 Major depressive disorder, recurrent, unspecified: Secondary | ICD-10-CM

## 2023-02-18 DIAGNOSIS — R350 Frequency of micturition: Secondary | ICD-10-CM | POA: Diagnosis not present

## 2023-02-18 DIAGNOSIS — N1831 Chronic kidney disease, stage 3a: Secondary | ICD-10-CM | POA: Diagnosis not present

## 2023-02-18 DIAGNOSIS — R4189 Other symptoms and signs involving cognitive functions and awareness: Secondary | ICD-10-CM

## 2023-02-18 DIAGNOSIS — I5022 Chronic systolic (congestive) heart failure: Secondary | ICD-10-CM

## 2023-02-18 DIAGNOSIS — R55 Syncope and collapse: Secondary | ICD-10-CM

## 2023-02-18 DIAGNOSIS — K219 Gastro-esophageal reflux disease without esophagitis: Secondary | ICD-10-CM

## 2023-02-18 NOTE — Assessment & Plan Note (Signed)
taking Pantoprazole, Hgb 11.5 01/29/23

## 2023-02-18 NOTE — Assessment & Plan Note (Signed)
No recurrence since admitted to SNF [facility], 2/2 to hypotension related to multiple diuretics/antihypertensive agents, autonomic dysfunction.

## 2023-02-18 NOTE — Assessment & Plan Note (Signed)
compensated, on Torsemide(does adjusted in the past for on and off cough, congestion, swelling, on higher dose Torsemide, also off Spironolactone presently).  EF 60-65%, BNP 41 01/29/23 Will monitor wt weekly.

## 2023-02-18 NOTE — Progress Notes (Signed)
Location:   SNF [facility] Room Number: 36 Place of Service:  SNF (31) Provider: Arna Snipe Diego Delancey NP  Mahlon Gammon, MD  Patient Care Team: Mahlon Gammon, MD as PCP - General (Internal Medicine) Bensimhon, Bevelyn Buckles, MD as PCP - Advanced Heart Failure (Cardiology) Georgann Housekeeper, MD as Consulting Physician (Internal Medicine)  Extended Emergency Contact Information Primary Emergency Contact: Ramesh,[name] Mobile Phone: [phone] Relation: Son Secondary Emergency Contact: Shams,[name] Mobile Phone: [phone] Relation: Son  Code Status:  DNR Goals of care: Advanced Directive information    02/11/2023    2:09 PM  Advanced Directives  Does Patient Have a Medical Advance Directive? Yes  Type of Estate agent of [place];Living will;Out of facility DNR (pink MOST or yellow form)  Does patient want to make changes to medical advance directive? No - Patient declined  Copy of Healthcare Power of Attorney in Chart? Yes - validated most recent copy scanned in chart (See row information)     Chief Complaint  Patient presents with   Medical Management of Chronic Issues    HPI:  Pt is a 89 y.o. male seen today for medical management of chronic diseases.    Hospitalized 12/08/22-12/13/22 for syncope work up, fell backward while brushing teeth, resulted a hematoma back of head-staples removal 12/17/22,  2/2 to hypotension, multiple factorials: multiple diuretics/antihypertensive agents, autonomic dysfunction. Resolved after fluid resuscitation.   CHF, compensated, on Torsemide(does adjusted in the past for on and off cough, congestion, swelling, on higher dose Torsemide, also off Spironolactone presently).  EF 60-65%, BNP 41 01/29/23  Pleural effusion, 07/2022 pleurx drain removed, 01/25/23 CXR bilateral pleural effusion, cough subsided after 2 days of increased Torsemide. Hx of cardiothoracic surgery for chronic management of pleural effusion.               Hypotension, better, taking reduced dose of Torsemide             Cough associated with swallowing             CKD Bun/creat 24/1.09 01/29/23             Syncope 2/2 to hypotension related to multiple diuretics/antihypertensive agents, autonomic dysfunction.              Hypokalemia, Kcl, K 3.7 01/29/23             Dementia, on Donepezil, Memantine, MMSE 2/30 12/09/22             TIA, Hx of             GERD, taking Pantoprazole, Hgb 11.5 01/29/23             HLD, on Simvastatin             Gout, taking Allopurinol             Urinary frequency, on Doxazosin             Depression, on Escitalopram             Hypothyroidism, on Levothyroxine, TSH 2.36 11/23/22             OA, uses walker, w/c to go further, taking Tylenol         Past Medical History:  Diagnosis Date   Actinic keratoses    Arthritis    CHF (congestive heart failure) (HCC)    Chronic bilateral pleural effusions    Chronic sinusitis    Colon polyps    Dyspnea 07/13/2013  BNP normal   Edema 07/13/2013   GERD (gastroesophageal reflux disease)    omeprazole 1-2 times daily   H/O TIA (transient ischemic attack) and stroke    on statins and plavix -saw Dr.Sethi in the past   Headache    History of BPH    History of TIA (transient ischemic attack) 07/13/2013   Left inguinal hernia    Left inguinal hernia 01/27/2019   Lumbar radiculopathy    Need for shingles vaccine    Optic neuritis 10/2015   left eye   Perennial allergic rhinitis    Polymyalgia rheumatica (HCC) 07/13/2013   Daily prednisone   Restless leg syndrome    Retinal disease    right eye since birth    Shingles    Temporal arteritis (HCC) 10/2015   Wears glasses    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   Past Surgical History:  Procedure Laterality Date   BRAVO PH STUDY N/A 04/18/2016   Procedure: BRAVO PH STUDY;  Surgeon: Charlott Rakes, MD;  Location: [facility];  Service: Endoscopy;  Laterality: N/A;   CHEST TUBE INSERTION Right 02/13/2018   Procedure:  INSERTION PLEURAL DRAINAGE CATHETER;  Surgeon: Kerin Perna, MD;  Location: [facility] OR;  Service: Thoracic;  Laterality: Right;   CHEST TUBE INSERTION Left 11/27/2019   Procedure: REDO INSERTION PLEURAL DRAINAGE CATHETER USING 15.5 FR. PLEURX CATH;  Surgeon: Donata Clay, Theron Arista, MD;  Location: [facility] OR;  Service: Thoracic;  Laterality: Left;   COLONOSCOPY     drropy eyelid surgery     ESOPHAGOGASTRODUODENOSCOPY (EGD) WITH PROPOFOL N/A 04/18/2016   Procedure: ESOPHAGOGASTRODUODENOSCOPY (EGD) WITH PROPOFOL;  Surgeon: Charlott Rakes, MD;  Location: [facility];  Service: Endoscopy;  Laterality: N/A;   INGUINAL HERNIA REPAIR Left 01/27/2019   Procedure: OPEN REPAIR LEFT INGUINAL HERNIA WITH MESH;  Surgeon: Claud Kelp, MD;  Location: [facility] OR;  Service: General;  Laterality: Left;  GENERAL AND TAP BLOCK ANESTHESIA   INSERTION OF MESH Left 01/27/2019   Procedure: Insertion Of Mesh;  Surgeon: Claud Kelp, MD;  Location: [facility] OR;  Service: General;  Laterality: Left;   IR THORACENTESIS ASP PLEURAL SPACE W/IMG GUIDE  02/04/2018   IR THORACENTESIS ASP PLEURAL SPACE W/IMG GUIDE  02/06/2018   REMOVAL OF PLEURAL DRAINAGE CATHETER Left 11/27/2019   Procedure: REMOVAL OF PLEURAL DRAINAGE CATHETER;  Surgeon: Kerin Perna, MD;  Location: [facility] OR;  Service: Thoracic;  Laterality: Left;   REMOVAL OF PLEURAL DRAINAGE CATHETER Left 03/31/2020   Procedure: REMOVAL OF PLEURAL DRAINAGE CATHETER;  Surgeon: Kerin Perna, MD;  Location: [facility] OR;  Service: Thoracic;  Laterality: Left;   RIGHT/LEFT HEART CATH AND CORONARY ANGIOGRAPHY N/A 06/24/2018   Procedure: RIGHT/LEFT HEART CATH AND CORONARY ANGIOGRAPHY;  Surgeon: Dolores Patty, MD;  Location: [facility];  Service: Cardiovascular;  Laterality: N/A;   VASECTOMY      No Known Allergies  Allergies as of 02/18/2023   No Known Allergies      Medication List        Accurate as of February 18, 2023  4:47 PM. If you have any questions, ask your nurse or doctor.           acetaminophen 500 MG tablet Commonly known as: TYLENOL Take 2 tablets (1,000 mg total) by mouth in the morning and at bedtime.   allopurinol 300 MG tablet Commonly known as: ZYLOPRIM Take 150 mg by mouth in the morning. For gout   aspirin 81 MG chewable tablet Chew 81 mg  by mouth in the morning. Chew and swallow tablet   Calcium 600 600 MG Tabs tablet Generic drug: calcium carbonate Take 600 mg by mouth 2 (two) times daily.   donepezil 5 MG disintegrating tablet Commonly known as: ARICEPT ODT Take 1 tablet (5 mg total) by mouth at bedtime. For co++ +   doxazosin 4 MG tablet Commonly known as: CARDURA Take 4 mg by mouth daily.   escitalopram 10 MG tablet Commonly known as: LEXAPRO TAKE 1 TABLET BY MOUTH DAILY   ipratropium-albuterol 0.5-2.5 (3) MG/3ML Soln Commonly known as: DUONEB Take 3 mLs by nebulization in the morning and at bedtime.   levothyroxine 25 MCG tablet Commonly known as: SYNTHROID Take 25 mcg by mouth daily before breakfast.   memantine 10 MG tablet Commonly known as: NAMENDA TAKE 1 TABLET BY MOUTH TWICE  DAILY   pantoprazole 20 MG tablet Commonly known as: PROTONIX Take 20 mg by mouth daily.   potassium chloride 10 MEQ tablet Commonly known as: KLOR-CON Take 10 mEq by mouth 2 (two) times a week. Monday & Tuesday   potassium chloride SA 20 MEQ tablet Commonly known as: KLOR-CON M Take 40 mEq by mouth daily.   simvastatin 20 MG tablet Commonly known as: ZOCOR Take 20 mg by mouth every evening.   Torsemide 40 MG Tabs Take 1 tablet by mouth daily.   Torsemide 40 MG Tabs Take 1 tablet by mouth 2 (two) times a week. Monday & Tuesday   Vitamin D3 50 MCG (2000 UT) Tabs Take 1 tablet by mouth daily.        Review of Systems  Unable to perform ROS: Dementia    Immunization History  Administered Date(s) Administered   Fluad Quad(high Dose 65+) 04/27/2021, 05/16/2022   Influenza, High Dose Seasonal PF 04/15/2017, 05/16/2018    Influenza,inj,Quad PF,6+ Mos 04/23/2015   Moderna SARS-COV2 Booster Vaccination 12/28/2020, 04/20/2021   Moderna Sars-Covid-2 Vaccination 07/26/2019, 08/26/2019, 06/06/2020   PPD Test 12/13/2022   Pneumococcal Conjugate-13 02/14/2016, 07/23/2016   Pneumococcal Polysaccharide-23 11/13/2012   Td 01/29/2006   Tdap 08/07/2018   Zoster Recombinant(Shingrix) 02/18/2019   Zoster, Live 02/18/2019, 07/01/2019   Pertinent  Health Maintenance Due  Topic Date Due   INFLUENZA VACCINE  02/21/2023      06/25/2022    2:44 AM 08/08/2022   10:15 AM 12/14/2022   10:26 AM 12/25/2022   10:14 AM 01/21/2023   10:00 AM  Fall Risk  Falls in the past year?  0 1 1 1   Was there an injury with Fall?  0 1 1 1   Fall Risk Category Calculator  0 3 3 3   (RETIRED) Patient Fall Risk Level High fall risk      Patient at Risk for Falls Due to  No Fall Risks History of fall(s);Impaired balance/gait;Impaired mobility;Impaired vision History of fall(s);Impaired balance/gait;Impaired mobility;Impaired vision History of fall(s);Impaired balance/gait;Impaired mobility  Fall risk Follow up  Falls evaluation completed Falls prevention discussed;Falls evaluation completed Falls evaluation completed Falls evaluation completed   Functional Status Survey:    Vitals:   02/18/23 1219  BP: 138/73  Pulse: 66  Resp: 18  Temp: (!) 97.3 F (36.3 C)  SpO2: 96%   There is no height or weight on file to calculate BMI. Physical Exam Vitals and nursing note reviewed.  Constitutional:      Appearance: Normal appearance.  HENT:     Head: Normocephalic.     Nose: Nose normal.     Mouth/Throat:     Mouth:  Mucous membranes are moist.  Eyes:     Extraocular Movements: Extraocular movements intact.     Conjunctiva/sclera: Conjunctivae normal.     Pupils: Pupils are equal, round, and reactive to light.  Cardiovascular:     Rate and Rhythm: Normal rate and regular rhythm.     Heart sounds: Murmur heard.  Pulmonary:     Effort:  Pulmonary effort is normal.     Breath sounds: Rales present. No wheezing or rhonchi.     Comments: bibasilar rales.  Abdominal:     General: Bowel sounds are normal.     Palpations: Abdomen is soft.     Tenderness: There is no abdominal tenderness.     Hernia: A hernia is present.     Comments: Hx of left inguinal hernia.   Musculoskeletal:     Cervical back: Normal range of motion and neck supple.     Right lower leg: Edema present.     Left lower leg: Edema present.     Comments: 1+ edema BLE  Skin:    General: Skin is warm and dry.  Neurological:     General: No focal deficit present.     Mental Status: He is alert. Mental status is at baseline.     Coordination: Coordination normal.     Gait: Gait abnormal.     Comments: Oriented to person  Psychiatric:        Mood and Affect: Mood normal.        Behavior: Behavior normal.     Comments: Smiled during today's visit.      Labs reviewed: Recent Labs    12/08/22 0920 12/08/22 1135 12/08/22 1559 12/08/22 2145 12/09/22 0220 12/10/22 0711 01/29/23 0000  NA 134*  --   --   --  135 144 140  K 2.6*  --   --    < > 3.3* 4.4 3.7  CL 96*  --   --   --  103 114* 106  CO2 25  --   --   --  24 24 27*  GLUCOSE 107*  --   --   --  106* 100*  --   BUN 51*  --   --   --  34* 24* 24*  CREATININE 1.48*  --   --   --  1.15 0.99 1.1  CALCIUM 8.6*  --   --   --  8.3* 8.1* 8.0*  MG  --  2.4  --   --   --   --   --   PHOS  --   --  3.4  --   --   --   --    < > = values in this interval not displayed.   Recent Labs    12/08/22 0920 12/10/22 0711 01/29/23 0000  AST 23 17 13*  ALT 13 12 5*  ALKPHOS 53 45 53  BILITOT 0.5 0.4  --   PROT 6.5 5.6*  --   ALBUMIN 3.3* 2.6* 2.8*   Recent Labs    11/23/22 0000 12/08/22 0920 12/10/22 0711 01/29/23 0000  WBC 8.8 10.6* 9.1 6.9  NEUTROABS 4,409.00 5.8  --  2,870.00  HGB 13.1* 12.7* 11.5* 11.5*  HCT 40* 38.0* 35.8* 36*  MCV  --  99.5 100.3*  --   PLT 334 334 314 328   Lab  Results  Component Value Date   TSH 2.36 11/23/2022   No results found for: "HGBA1C" Lab Results  Component  Value Date   CHOL 121 11/23/2022   HDL 48 11/23/2022   LDLCALC 50 11/23/2022   TRIG 144 11/23/2022    Significant Diagnostic Results in last 30 days:  No results found.  Assessment/Plan  Systolic CHF (HCC)  compensated, on Torsemide(does adjusted in the past for on and off cough, congestion, swelling, on higher dose Torsemide, also off Spironolactone presently).  EF 60-65%, BNP 41 01/29/23 Will monitor wt weekly.   CKD (chronic kidney disease) stage 3, GFR 30-59 ml/min (HCC) Bun/creat 24/1.09 01/29/23  Syncope and collapse No recurrence since admitted to SNF [facility], 2/2 to hypotension related to multiple diuretics/antihypertensive agents, autonomic dysfunction.   Cognitive impairment on Donepezil, Memantine, MMSE 2/30 12/09/22, no behavioral issues.   Esophageal reflux taking Pantoprazole, Hgb 11.5 01/29/23  Urinary frequency on Doxazosin, no change.   Recurrent depression (HCC) Stable, on Escitalopram  Hypothyroidism on Levothyroxine, TSH 2.36 11/23/22  Bilateral pleural effusion 07/2022 pleurx drain removed, 01/25/23 CXR bilateral pleural effusion, cough subsided after 2 days of increased Torsemide. Hx of cardiothoracic surgery for chronic management of pleural effusion.    Family/ staff Communication: plan of care reviewed with the patient and charge nurse.   Labs/tests ordered:  none  Time spend 35 minutes.

## 2023-02-18 NOTE — Assessment & Plan Note (Signed)
07/2022 pleurx drain removed, 01/25/23 CXR bilateral pleural effusion, cough subsided after 2 days of increased Torsemide. Hx of cardiothoracic surgery for chronic management of pleural effusion.

## 2023-02-18 NOTE — Assessment & Plan Note (Signed)
Stable, on Escitalopram 

## 2023-02-18 NOTE — Assessment & Plan Note (Signed)
on Donepezil, Memantine, MMSE 2/30 12/09/22, no behavioral issues.

## 2023-02-18 NOTE — Assessment & Plan Note (Signed)
Bun/creat 24/1.09 01/29/23

## 2023-02-18 NOTE — Assessment & Plan Note (Signed)
on Doxazosin, no change.

## 2023-02-18 NOTE — Assessment & Plan Note (Signed)
on Levothyroxine, TSH 2.36 11/23/22 

## 2023-02-18 NOTE — Progress Notes (Signed)
This encounter was created in error - please disregard.

## 2023-02-19 DIAGNOSIS — R2689 Other abnormalities of gait and mobility: Secondary | ICD-10-CM | POA: Diagnosis not present

## 2023-02-19 DIAGNOSIS — M6281 Muscle weakness (generalized): Secondary | ICD-10-CM | POA: Diagnosis not present

## 2023-02-19 DIAGNOSIS — R1312 Dysphagia, oropharyngeal phase: Secondary | ICD-10-CM | POA: Diagnosis not present

## 2023-02-20 DIAGNOSIS — R2689 Other abnormalities of gait and mobility: Secondary | ICD-10-CM | POA: Diagnosis not present

## 2023-02-20 DIAGNOSIS — M6281 Muscle weakness (generalized): Secondary | ICD-10-CM | POA: Diagnosis not present

## 2023-02-20 DIAGNOSIS — R1312 Dysphagia, oropharyngeal phase: Secondary | ICD-10-CM | POA: Diagnosis not present

## 2023-02-21 DIAGNOSIS — R1312 Dysphagia, oropharyngeal phase: Secondary | ICD-10-CM | POA: Diagnosis not present

## 2023-02-26 DIAGNOSIS — R1312 Dysphagia, oropharyngeal phase: Secondary | ICD-10-CM | POA: Diagnosis not present

## 2023-02-27 DIAGNOSIS — R1312 Dysphagia, oropharyngeal phase: Secondary | ICD-10-CM | POA: Diagnosis not present

## 2023-03-01 DIAGNOSIS — R1312 Dysphagia, oropharyngeal phase: Secondary | ICD-10-CM | POA: Diagnosis not present

## 2023-03-04 DIAGNOSIS — N183 Chronic kidney disease, stage 3 unspecified: Secondary | ICD-10-CM | POA: Diagnosis not present

## 2023-03-04 DIAGNOSIS — K219 Gastro-esophageal reflux disease without esophagitis: Secondary | ICD-10-CM | POA: Diagnosis not present

## 2023-03-04 DIAGNOSIS — M109 Gout, unspecified: Secondary | ICD-10-CM | POA: Diagnosis not present

## 2023-03-04 DIAGNOSIS — M1991 Primary osteoarthritis, unspecified site: Secondary | ICD-10-CM | POA: Diagnosis not present

## 2023-03-04 DIAGNOSIS — Z8673 Personal history of transient ischemic attack (TIA), and cerebral infarction without residual deficits: Secondary | ICD-10-CM | POA: Diagnosis not present

## 2023-03-04 DIAGNOSIS — F331 Major depressive disorder, recurrent, moderate: Secondary | ICD-10-CM | POA: Diagnosis not present

## 2023-03-04 DIAGNOSIS — I679 Cerebrovascular disease, unspecified: Secondary | ICD-10-CM | POA: Diagnosis not present

## 2023-03-04 DIAGNOSIS — N400 Enlarged prostate without lower urinary tract symptoms: Secondary | ICD-10-CM | POA: Diagnosis not present

## 2023-03-04 DIAGNOSIS — I5022 Chronic systolic (congestive) heart failure: Secondary | ICD-10-CM | POA: Diagnosis not present

## 2023-03-04 DIAGNOSIS — F02A11 Dementia in other diseases classified elsewhere, mild, with agitation: Secondary | ICD-10-CM | POA: Diagnosis not present

## 2023-03-04 DIAGNOSIS — E039 Hypothyroidism, unspecified: Secondary | ICD-10-CM | POA: Diagnosis not present

## 2023-03-04 DIAGNOSIS — R55 Syncope and collapse: Secondary | ICD-10-CM | POA: Diagnosis not present

## 2023-03-05 DIAGNOSIS — R1312 Dysphagia, oropharyngeal phase: Secondary | ICD-10-CM | POA: Diagnosis not present

## 2023-03-06 DIAGNOSIS — I5022 Chronic systolic (congestive) heart failure: Secondary | ICD-10-CM | POA: Diagnosis not present

## 2023-03-06 DIAGNOSIS — F02A11 Dementia in other diseases classified elsewhere, mild, with agitation: Secondary | ICD-10-CM | POA: Diagnosis not present

## 2023-03-06 DIAGNOSIS — I679 Cerebrovascular disease, unspecified: Secondary | ICD-10-CM | POA: Diagnosis not present

## 2023-03-06 DIAGNOSIS — Z8673 Personal history of transient ischemic attack (TIA), and cerebral infarction without residual deficits: Secondary | ICD-10-CM | POA: Diagnosis not present

## 2023-03-06 DIAGNOSIS — N183 Chronic kidney disease, stage 3 unspecified: Secondary | ICD-10-CM | POA: Diagnosis not present

## 2023-03-06 DIAGNOSIS — F331 Major depressive disorder, recurrent, moderate: Secondary | ICD-10-CM | POA: Diagnosis not present

## 2023-03-07 DIAGNOSIS — F02A11 Dementia in other diseases classified elsewhere, mild, with agitation: Secondary | ICD-10-CM | POA: Diagnosis not present

## 2023-03-07 DIAGNOSIS — I5022 Chronic systolic (congestive) heart failure: Secondary | ICD-10-CM | POA: Diagnosis not present

## 2023-03-07 DIAGNOSIS — F331 Major depressive disorder, recurrent, moderate: Secondary | ICD-10-CM | POA: Diagnosis not present

## 2023-03-07 DIAGNOSIS — Z8673 Personal history of transient ischemic attack (TIA), and cerebral infarction without residual deficits: Secondary | ICD-10-CM | POA: Diagnosis not present

## 2023-03-07 DIAGNOSIS — I679 Cerebrovascular disease, unspecified: Secondary | ICD-10-CM | POA: Diagnosis not present

## 2023-03-07 DIAGNOSIS — N183 Chronic kidney disease, stage 3 unspecified: Secondary | ICD-10-CM | POA: Diagnosis not present

## 2023-03-08 DIAGNOSIS — N183 Chronic kidney disease, stage 3 unspecified: Secondary | ICD-10-CM | POA: Diagnosis not present

## 2023-03-08 DIAGNOSIS — F02A11 Dementia in other diseases classified elsewhere, mild, with agitation: Secondary | ICD-10-CM | POA: Diagnosis not present

## 2023-03-08 DIAGNOSIS — I5022 Chronic systolic (congestive) heart failure: Secondary | ICD-10-CM | POA: Diagnosis not present

## 2023-03-08 DIAGNOSIS — F331 Major depressive disorder, recurrent, moderate: Secondary | ICD-10-CM | POA: Diagnosis not present

## 2023-03-08 DIAGNOSIS — Z8673 Personal history of transient ischemic attack (TIA), and cerebral infarction without residual deficits: Secondary | ICD-10-CM | POA: Diagnosis not present

## 2023-03-08 DIAGNOSIS — I679 Cerebrovascular disease, unspecified: Secondary | ICD-10-CM | POA: Diagnosis not present

## 2023-03-12 DIAGNOSIS — F331 Major depressive disorder, recurrent, moderate: Secondary | ICD-10-CM | POA: Diagnosis not present

## 2023-03-12 DIAGNOSIS — F02A11 Dementia in other diseases classified elsewhere, mild, with agitation: Secondary | ICD-10-CM | POA: Diagnosis not present

## 2023-03-12 DIAGNOSIS — N183 Chronic kidney disease, stage 3 unspecified: Secondary | ICD-10-CM | POA: Diagnosis not present

## 2023-03-12 DIAGNOSIS — Z8673 Personal history of transient ischemic attack (TIA), and cerebral infarction without residual deficits: Secondary | ICD-10-CM | POA: Diagnosis not present

## 2023-03-12 DIAGNOSIS — I679 Cerebrovascular disease, unspecified: Secondary | ICD-10-CM | POA: Diagnosis not present

## 2023-03-12 DIAGNOSIS — I5022 Chronic systolic (congestive) heart failure: Secondary | ICD-10-CM | POA: Diagnosis not present

## 2023-03-13 DIAGNOSIS — F02A11 Dementia in other diseases classified elsewhere, mild, with agitation: Secondary | ICD-10-CM | POA: Diagnosis not present

## 2023-03-13 DIAGNOSIS — Z8673 Personal history of transient ischemic attack (TIA), and cerebral infarction without residual deficits: Secondary | ICD-10-CM | POA: Diagnosis not present

## 2023-03-13 DIAGNOSIS — I679 Cerebrovascular disease, unspecified: Secondary | ICD-10-CM | POA: Diagnosis not present

## 2023-03-13 DIAGNOSIS — F331 Major depressive disorder, recurrent, moderate: Secondary | ICD-10-CM | POA: Diagnosis not present

## 2023-03-13 DIAGNOSIS — N183 Chronic kidney disease, stage 3 unspecified: Secondary | ICD-10-CM | POA: Diagnosis not present

## 2023-03-13 DIAGNOSIS — I5022 Chronic systolic (congestive) heart failure: Secondary | ICD-10-CM | POA: Diagnosis not present

## 2023-03-14 DIAGNOSIS — F331 Major depressive disorder, recurrent, moderate: Secondary | ICD-10-CM | POA: Diagnosis not present

## 2023-03-14 DIAGNOSIS — I679 Cerebrovascular disease, unspecified: Secondary | ICD-10-CM | POA: Diagnosis not present

## 2023-03-14 DIAGNOSIS — F02A11 Dementia in other diseases classified elsewhere, mild, with agitation: Secondary | ICD-10-CM | POA: Diagnosis not present

## 2023-03-14 DIAGNOSIS — Z8673 Personal history of transient ischemic attack (TIA), and cerebral infarction without residual deficits: Secondary | ICD-10-CM | POA: Diagnosis not present

## 2023-03-14 DIAGNOSIS — N183 Chronic kidney disease, stage 3 unspecified: Secondary | ICD-10-CM | POA: Diagnosis not present

## 2023-03-14 DIAGNOSIS — I5022 Chronic systolic (congestive) heart failure: Secondary | ICD-10-CM | POA: Diagnosis not present

## 2023-03-15 DIAGNOSIS — F331 Major depressive disorder, recurrent, moderate: Secondary | ICD-10-CM | POA: Diagnosis not present

## 2023-03-15 DIAGNOSIS — N183 Chronic kidney disease, stage 3 unspecified: Secondary | ICD-10-CM | POA: Diagnosis not present

## 2023-03-15 DIAGNOSIS — Z8673 Personal history of transient ischemic attack (TIA), and cerebral infarction without residual deficits: Secondary | ICD-10-CM | POA: Diagnosis not present

## 2023-03-15 DIAGNOSIS — F02A11 Dementia in other diseases classified elsewhere, mild, with agitation: Secondary | ICD-10-CM | POA: Diagnosis not present

## 2023-03-15 DIAGNOSIS — I5022 Chronic systolic (congestive) heart failure: Secondary | ICD-10-CM | POA: Diagnosis not present

## 2023-03-15 DIAGNOSIS — I679 Cerebrovascular disease, unspecified: Secondary | ICD-10-CM | POA: Diagnosis not present

## 2023-03-19 DIAGNOSIS — I5022 Chronic systolic (congestive) heart failure: Secondary | ICD-10-CM | POA: Diagnosis not present

## 2023-03-19 DIAGNOSIS — N183 Chronic kidney disease, stage 3 unspecified: Secondary | ICD-10-CM | POA: Diagnosis not present

## 2023-03-19 DIAGNOSIS — Z8673 Personal history of transient ischemic attack (TIA), and cerebral infarction without residual deficits: Secondary | ICD-10-CM | POA: Diagnosis not present

## 2023-03-19 DIAGNOSIS — F02A11 Dementia in other diseases classified elsewhere, mild, with agitation: Secondary | ICD-10-CM | POA: Diagnosis not present

## 2023-03-19 DIAGNOSIS — I679 Cerebrovascular disease, unspecified: Secondary | ICD-10-CM | POA: Diagnosis not present

## 2023-03-19 DIAGNOSIS — F331 Major depressive disorder, recurrent, moderate: Secondary | ICD-10-CM | POA: Diagnosis not present

## 2023-03-21 DIAGNOSIS — I5022 Chronic systolic (congestive) heart failure: Secondary | ICD-10-CM | POA: Diagnosis not present

## 2023-03-21 DIAGNOSIS — F331 Major depressive disorder, recurrent, moderate: Secondary | ICD-10-CM | POA: Diagnosis not present

## 2023-03-21 DIAGNOSIS — Z8673 Personal history of transient ischemic attack (TIA), and cerebral infarction without residual deficits: Secondary | ICD-10-CM | POA: Diagnosis not present

## 2023-03-21 DIAGNOSIS — N183 Chronic kidney disease, stage 3 unspecified: Secondary | ICD-10-CM | POA: Diagnosis not present

## 2023-03-21 DIAGNOSIS — F02A11 Dementia in other diseases classified elsewhere, mild, with agitation: Secondary | ICD-10-CM | POA: Diagnosis not present

## 2023-03-21 DIAGNOSIS — I679 Cerebrovascular disease, unspecified: Secondary | ICD-10-CM | POA: Diagnosis not present

## 2023-03-22 ENCOUNTER — Non-Acute Institutional Stay (SKILLED_NURSING_FACILITY): Payer: Medicare Other | Admitting: Nurse Practitioner

## 2023-03-22 DIAGNOSIS — F339 Major depressive disorder, recurrent, unspecified: Secondary | ICD-10-CM | POA: Diagnosis not present

## 2023-03-22 DIAGNOSIS — E039 Hypothyroidism, unspecified: Secondary | ICD-10-CM

## 2023-03-22 DIAGNOSIS — J90 Pleural effusion, not elsewhere classified: Secondary | ICD-10-CM

## 2023-03-22 DIAGNOSIS — R55 Syncope and collapse: Secondary | ICD-10-CM

## 2023-03-22 DIAGNOSIS — N1831 Chronic kidney disease, stage 3a: Secondary | ICD-10-CM

## 2023-03-22 DIAGNOSIS — E785 Hyperlipidemia, unspecified: Secondary | ICD-10-CM

## 2023-03-22 DIAGNOSIS — F0390 Unspecified dementia without behavioral disturbance: Secondary | ICD-10-CM

## 2023-03-22 DIAGNOSIS — E876 Hypokalemia: Secondary | ICD-10-CM | POA: Diagnosis not present

## 2023-03-22 DIAGNOSIS — K219 Gastro-esophageal reflux disease without esophagitis: Secondary | ICD-10-CM

## 2023-03-22 DIAGNOSIS — M1A9XX0 Chronic gout, unspecified, without tophus (tophi): Secondary | ICD-10-CM | POA: Diagnosis not present

## 2023-03-22 DIAGNOSIS — I959 Hypotension, unspecified: Secondary | ICD-10-CM

## 2023-03-22 DIAGNOSIS — I5022 Chronic systolic (congestive) heart failure: Secondary | ICD-10-CM | POA: Diagnosis not present

## 2023-03-22 DIAGNOSIS — M1990 Unspecified osteoarthritis, unspecified site: Secondary | ICD-10-CM

## 2023-03-22 DIAGNOSIS — R350 Frequency of micturition: Secondary | ICD-10-CM | POA: Diagnosis not present

## 2023-03-24 DIAGNOSIS — F02A11 Dementia in other diseases classified elsewhere, mild, with agitation: Secondary | ICD-10-CM | POA: Diagnosis not present

## 2023-03-24 DIAGNOSIS — E039 Hypothyroidism, unspecified: Secondary | ICD-10-CM | POA: Diagnosis not present

## 2023-03-24 DIAGNOSIS — R55 Syncope and collapse: Secondary | ICD-10-CM | POA: Diagnosis not present

## 2023-03-24 DIAGNOSIS — K219 Gastro-esophageal reflux disease without esophagitis: Secondary | ICD-10-CM | POA: Diagnosis not present

## 2023-03-24 DIAGNOSIS — I5022 Chronic systolic (congestive) heart failure: Secondary | ICD-10-CM | POA: Diagnosis not present

## 2023-03-24 DIAGNOSIS — M109 Gout, unspecified: Secondary | ICD-10-CM | POA: Diagnosis not present

## 2023-03-24 DIAGNOSIS — M1991 Primary osteoarthritis, unspecified site: Secondary | ICD-10-CM | POA: Diagnosis not present

## 2023-03-24 DIAGNOSIS — F331 Major depressive disorder, recurrent, moderate: Secondary | ICD-10-CM | POA: Diagnosis not present

## 2023-03-24 DIAGNOSIS — I679 Cerebrovascular disease, unspecified: Secondary | ICD-10-CM | POA: Diagnosis not present

## 2023-03-24 DIAGNOSIS — Z8673 Personal history of transient ischemic attack (TIA), and cerebral infarction without residual deficits: Secondary | ICD-10-CM | POA: Diagnosis not present

## 2023-03-24 DIAGNOSIS — N183 Chronic kidney disease, stage 3 unspecified: Secondary | ICD-10-CM | POA: Diagnosis not present

## 2023-03-24 DIAGNOSIS — N400 Enlarged prostate without lower urinary tract symptoms: Secondary | ICD-10-CM | POA: Diagnosis not present

## 2023-03-26 DIAGNOSIS — F02A11 Dementia in other diseases classified elsewhere, mild, with agitation: Secondary | ICD-10-CM | POA: Diagnosis not present

## 2023-03-26 DIAGNOSIS — F331 Major depressive disorder, recurrent, moderate: Secondary | ICD-10-CM | POA: Diagnosis not present

## 2023-03-26 DIAGNOSIS — I679 Cerebrovascular disease, unspecified: Secondary | ICD-10-CM | POA: Diagnosis not present

## 2023-03-26 DIAGNOSIS — I5022 Chronic systolic (congestive) heart failure: Secondary | ICD-10-CM | POA: Diagnosis not present

## 2023-03-26 DIAGNOSIS — N183 Chronic kidney disease, stage 3 unspecified: Secondary | ICD-10-CM | POA: Diagnosis not present

## 2023-03-26 DIAGNOSIS — Z8673 Personal history of transient ischemic attack (TIA), and cerebral infarction without residual deficits: Secondary | ICD-10-CM | POA: Diagnosis not present

## 2023-03-27 DIAGNOSIS — I5022 Chronic systolic (congestive) heart failure: Secondary | ICD-10-CM | POA: Diagnosis not present

## 2023-03-27 DIAGNOSIS — F02A11 Dementia in other diseases classified elsewhere, mild, with agitation: Secondary | ICD-10-CM | POA: Diagnosis not present

## 2023-03-27 DIAGNOSIS — N183 Chronic kidney disease, stage 3 unspecified: Secondary | ICD-10-CM | POA: Diagnosis not present

## 2023-03-27 DIAGNOSIS — F331 Major depressive disorder, recurrent, moderate: Secondary | ICD-10-CM | POA: Diagnosis not present

## 2023-03-27 DIAGNOSIS — I679 Cerebrovascular disease, unspecified: Secondary | ICD-10-CM | POA: Diagnosis not present

## 2023-03-27 DIAGNOSIS — Z8673 Personal history of transient ischemic attack (TIA), and cerebral infarction without residual deficits: Secondary | ICD-10-CM | POA: Diagnosis not present

## 2023-03-28 ENCOUNTER — Encounter: Payer: Self-pay | Admitting: Nurse Practitioner

## 2023-03-28 DIAGNOSIS — N183 Chronic kidney disease, stage 3 unspecified: Secondary | ICD-10-CM | POA: Diagnosis not present

## 2023-03-28 DIAGNOSIS — I5022 Chronic systolic (congestive) heart failure: Secondary | ICD-10-CM | POA: Diagnosis not present

## 2023-03-28 DIAGNOSIS — F331 Major depressive disorder, recurrent, moderate: Secondary | ICD-10-CM | POA: Diagnosis not present

## 2023-03-28 DIAGNOSIS — Z8673 Personal history of transient ischemic attack (TIA), and cerebral infarction without residual deficits: Secondary | ICD-10-CM | POA: Diagnosis not present

## 2023-03-28 DIAGNOSIS — I679 Cerebrovascular disease, unspecified: Secondary | ICD-10-CM | POA: Diagnosis not present

## 2023-03-28 DIAGNOSIS — F02A11 Dementia in other diseases classified elsewhere, mild, with agitation: Secondary | ICD-10-CM | POA: Diagnosis not present

## 2023-03-28 NOTE — Assessment & Plan Note (Signed)
better, taking reduced dose of Torsemide

## 2023-03-28 NOTE — Assessment & Plan Note (Signed)
on Doxazosin

## 2023-03-28 NOTE — Progress Notes (Signed)
Location:   SNF [facility] Room Number: 68 Place of Service:  SNF (31) Provider: Arna Snipe Anav Lammert NP  Mahlon Gammon, MD  Patient Care Team: Mahlon Gammon, MD as PCP - General (Internal Medicine) Bensimhon, Bevelyn Buckles, MD as PCP - Advanced Heart Failure (Cardiology) Georgann Housekeeper, MD as Consulting Physician (Internal Medicine)  Extended Emergency Contact Information Primary Emergency Contact: Gaspard,[name] Mobile Phone: [phone] Relation: Son Secondary Emergency Contact: Williams,[name] Mobile Phone: [phone] Relation: Son  Code Status:  DNR Goals of care: Advanced Directive information    02/11/2023    2:09 PM  Advanced Directives  Does Patient Have a Medical Advance Directive? Yes  Type of Estate agent of [place];Living will;Out of facility DNR (pink MOST or yellow form)  Does patient want to make changes to medical advance directive? No - Patient declined  Copy of Healthcare Power of Attorney in Chart? Yes - validated most recent copy scanned in chart (See row information)     Chief Complaint  Patient presents with   Medical Management of Chronic Issues    HPI:  Pt is a 89 y.o. male seen today for medical management of chronic diseases.        Hospitalized 12/08/22-12/13/22 for syncope work up, fell backward while brushing teeth, resulted a hematoma back of head-staples removal 12/17/22,  2/2 to hypotension, multiple factorials: multiple diuretics/antihypertensive agents, autonomic dysfunction. Resolved after fluid resuscitation.              CHF, compensated, on Torsemide(does adjusted in the past for on and off cough, congestion, swelling, on higher dose Torsemide, also off Spironolactone presently).  EF 60-65%, BNP 41 01/29/23             Pleural effusion, 07/2022 pleurx drain removed, 01/25/23 CXR bilateral pleural effusion, cough subsided after 2 days of increased Torsemide. Hx of cardiothoracic surgery for chronic management of pleural  effusion.              Hypotension, better, taking reduced dose of Torsemide             Cough associated with swallowing             CKD Bun/creat 24/1.09 01/29/23             Syncope 2/2 to hypotension related to multiple diuretics/antihypertensive agents, autonomic dysfunction.              Hypokalemia, Kcl, K 3.7 01/29/23             Dementia, on Donepezil, Memantine, MMSE 2/30 12/09/22             TIA, Hx of             GERD, taking Pantoprazole, Hgb 11.5 01/29/23             HLD, on Simvastatin             Gout, taking Allopurinol             Urinary frequency, on Doxazosin             Depression, on Escitalopram             Hypothyroidism, on Levothyroxine, TSH 2.36 11/23/22             OA, uses walker, w/c to go further, taking Tylenol     Past Medical History:  Diagnosis Date   Actinic keratoses    Arthritis    CHF (congestive heart failure) (HCC)  Chronic bilateral pleural effusions    Chronic sinusitis    Colon polyps    Dyspnea 07/13/2013   BNP normal   Edema 07/13/2013   GERD (gastroesophageal reflux disease)    omeprazole 1-2 times daily   H/O TIA (transient ischemic attack) and stroke    on statins and plavix -saw Dr.Sethi in the past   Headache    History of BPH    History of TIA (transient ischemic attack) 07/13/2013   Left inguinal hernia    Left inguinal hernia 01/27/2019   Lumbar radiculopathy    Need for shingles vaccine    Optic neuritis 10/2015   left eye   Perennial allergic rhinitis    Polymyalgia rheumatica (HCC) 07/13/2013   Daily prednisone   Restless leg syndrome    Retinal disease    right eye since birth    Shingles    Temporal arteritis (HCC) 10/2015   Wears glasses    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   Past Surgical History:  Procedure Laterality Date   BRAVO PH STUDY N/A 04/18/2016   Procedure: BRAVO PH STUDY;  Surgeon: Charlott Rakes, MD;  Location: [facility];  Service: Endoscopy;  Laterality: N/A;   CHEST TUBE INSERTION Right  02/13/2018   Procedure: INSERTION PLEURAL DRAINAGE CATHETER;  Surgeon: Kerin Perna, MD;  Location: [facility] OR;  Service: Thoracic;  Laterality: Right;   CHEST TUBE INSERTION Left 11/27/2019   Procedure: REDO INSERTION PLEURAL DRAINAGE CATHETER USING 15.5 FR. PLEURX CATH;  Surgeon: Donata Clay, Theron Arista, MD;  Location: [facility] OR;  Service: Thoracic;  Laterality: Left;   COLONOSCOPY     drropy eyelid surgery     ESOPHAGOGASTRODUODENOSCOPY (EGD) WITH PROPOFOL N/A 04/18/2016   Procedure: ESOPHAGOGASTRODUODENOSCOPY (EGD) WITH PROPOFOL;  Surgeon: Charlott Rakes, MD;  Location: [facility];  Service: Endoscopy;  Laterality: N/A;   INGUINAL HERNIA REPAIR Left 01/27/2019   Procedure: OPEN REPAIR LEFT INGUINAL HERNIA WITH MESH;  Surgeon: Claud Kelp, MD;  Location: [facility] OR;  Service: General;  Laterality: Left;  GENERAL AND TAP BLOCK ANESTHESIA   INSERTION OF MESH Left 01/27/2019   Procedure: Insertion Of Mesh;  Surgeon: Claud Kelp, MD;  Location: [facility] OR;  Service: General;  Laterality: Left;   IR THORACENTESIS ASP PLEURAL SPACE W/IMG GUIDE  02/04/2018   IR THORACENTESIS ASP PLEURAL SPACE W/IMG GUIDE  02/06/2018   REMOVAL OF PLEURAL DRAINAGE CATHETER Left 11/27/2019   Procedure: REMOVAL OF PLEURAL DRAINAGE CATHETER;  Surgeon: Kerin Perna, MD;  Location: [facility] OR;  Service: Thoracic;  Laterality: Left;   REMOVAL OF PLEURAL DRAINAGE CATHETER Left 03/31/2020   Procedure: REMOVAL OF PLEURAL DRAINAGE CATHETER;  Surgeon: Kerin Perna, MD;  Location: [facility] OR;  Service: Thoracic;  Laterality: Left;   RIGHT/LEFT HEART CATH AND CORONARY ANGIOGRAPHY N/A 06/24/2018   Procedure: RIGHT/LEFT HEART CATH AND CORONARY ANGIOGRAPHY;  Surgeon: Dolores Patty, MD;  Location: [facility];  Service: Cardiovascular;  Laterality: N/A;   VASECTOMY      No Known Allergies  Allergies as of 03/22/2023   No Known Allergies      Medication List        Accurate as of March 22, 2023 11:59 PM. If you have any questions, ask  your nurse or doctor.          acetaminophen 500 MG tablet Commonly known as: TYLENOL Take 2 tablets (1,000 mg total) by mouth in the morning and at bedtime.   allopurinol 300 MG tablet Commonly known as: ZYLOPRIM  Take 150 mg by mouth in the morning. For gout   aspirin 81 MG chewable tablet Chew 81 mg by mouth in the morning. Chew and swallow tablet   Calcium 600 600 MG Tabs tablet Generic drug: calcium carbonate Take 600 mg by mouth 2 (two) times daily.   donepezil 5 MG disintegrating tablet Commonly known as: ARICEPT ODT Take 1 tablet (5 mg total) by mouth at bedtime. For co++ +   doxazosin 4 MG tablet Commonly known as: CARDURA Take 4 mg by mouth daily.   escitalopram 10 MG tablet Commonly known as: LEXAPRO TAKE 1 TABLET BY MOUTH DAILY   ipratropium-albuterol 0.5-2.5 (3) MG/3ML Soln Commonly known as: DUONEB Take 3 mLs by nebulization in the morning and at bedtime.   levothyroxine 25 MCG tablet Commonly known as: SYNTHROID Take 25 mcg by mouth daily before breakfast.   memantine 10 MG tablet Commonly known as: NAMENDA TAKE 1 TABLET BY MOUTH TWICE  DAILY   pantoprazole 20 MG tablet Commonly known as: PROTONIX Take 20 mg by mouth daily.   potassium chloride 10 MEQ tablet Commonly known as: KLOR-CON Take 10 mEq by mouth 2 (two) times a week. Monday & Tuesday   potassium chloride SA 20 MEQ tablet Commonly known as: KLOR-CON M Take 40 mEq by mouth daily.   simvastatin 20 MG tablet Commonly known as: ZOCOR Take 20 mg by mouth every evening.   Torsemide 40 MG Tabs Take 1 tablet by mouth daily.   Torsemide 40 MG Tabs Take 1 tablet by mouth 2 (two) times a week. Monday & Tuesday   Vitamin D3 50 MCG (2000 UT) Tabs Take 1 tablet by mouth daily.        Review of Systems  Unable to perform ROS: Dementia    Immunization History  Administered Date(s) Administered   Fluad Quad(high Dose 65+) 04/27/2021, 05/16/2022   Influenza, High Dose Seasonal PF  04/15/2017, 05/16/2018   Influenza,inj,Quad PF,6+ Mos 04/23/2015   Moderna SARS-COV2 Booster Vaccination 12/28/2020, 04/20/2021   Moderna Sars-Covid-2 Vaccination 07/26/2019, 08/26/2019, 06/06/2020   PPD Test 12/13/2022   Pneumococcal Conjugate-13 02/14/2016, 07/23/2016   Pneumococcal Polysaccharide-23 11/13/2012   Td 01/29/2006   Tdap 08/07/2018   Zoster Recombinant(Shingrix) 02/18/2019   Zoster, Live 02/18/2019, 07/01/2019   Pertinent  Health Maintenance Due  Topic Date Due   INFLUENZA VACCINE  02/21/2023      06/25/2022    2:44 AM 08/08/2022   10:15 AM 12/14/2022   10:26 AM 12/25/2022   10:14 AM 01/21/2023   10:00 AM  Fall Risk  Falls in the past year?  0 1 1 1   Was there an injury with Fall?  0 1 1 1   Fall Risk Category Calculator  0 3 3 3   (RETIRED) Patient Fall Risk Level High fall risk      Patient at Risk for Falls Due to  No Fall Risks History of fall(s);Impaired balance/gait;Impaired mobility;Impaired vision History of fall(s);Impaired balance/gait;Impaired mobility;Impaired vision History of fall(s);Impaired balance/gait;Impaired mobility  Fall risk Follow up  Falls evaluation completed Falls prevention discussed;Falls evaluation completed Falls evaluation completed Falls evaluation completed   Functional Status Survey:    Vitals:   03/22/23 1603  BP: (!) 121/50  Pulse: 74  Resp: 18  Temp: 97.6 F (36.4 C)  SpO2: 96%   There is no height or weight on file to calculate BMI. Physical Exam Vitals and nursing note reviewed.  Constitutional:      Appearance: Normal appearance.  HENT:  Head: Normocephalic.     Nose: Nose normal.     Mouth/Throat:     Mouth: Mucous membranes are moist.  Eyes:     Extraocular Movements: Extraocular movements intact.     Conjunctiva/sclera: Conjunctivae normal.     Pupils: Pupils are equal, round, and reactive to light.  Cardiovascular:     Rate and Rhythm: Normal rate and regular rhythm.     Heart sounds: Murmur heard.   Pulmonary:     Effort: Pulmonary effort is normal.     Breath sounds: Rales present. No wheezing or rhonchi.     Comments: bibasilar rales.  Abdominal:     General: Bowel sounds are normal.     Palpations: Abdomen is soft.     Tenderness: There is no abdominal tenderness.     Hernia: A hernia is present.     Comments: Hx of left inguinal hernia.   Musculoskeletal:     Cervical back: Normal range of motion and neck supple.     Right lower leg: Edema present.     Left lower leg: Edema present.     Comments: 1+ edema BLE  Skin:    General: Skin is warm and dry.  Neurological:     General: No focal deficit present.     Mental Status: He is alert. Mental status is at baseline.     Coordination: Coordination normal.     Gait: Gait abnormal.     Comments: Oriented to person  Psychiatric:        Mood and Affect: Mood normal.        Behavior: Behavior normal.     Comments: Smiled during today's visit.      Labs reviewed: Recent Labs    12/08/22 0920 12/08/22 1135 12/08/22 1559 12/08/22 2145 12/09/22 0220 12/10/22 0711 01/29/23 0000  NA 134*  --   --   --  135 144 140  K 2.6*  --   --    < > 3.3* 4.4 3.7  CL 96*  --   --   --  103 114* 106  CO2 25  --   --   --  24 24 27*  GLUCOSE 107*  --   --   --  106* 100*  --   BUN 51*  --   --   --  34* 24* 24*  CREATININE 1.48*  --   --   --  1.15 0.99 1.1  CALCIUM 8.6*  --   --   --  8.3* 8.1* 8.0*  MG  --  2.4  --   --   --   --   --   PHOS  --   --  3.4  --   --   --   --    < > = values in this interval not displayed.   Recent Labs    12/08/22 0920 12/10/22 0711 01/29/23 0000  AST 23 17 13*  ALT 13 12 5*  ALKPHOS 53 45 53  BILITOT 0.5 0.4  --   PROT 6.5 5.6*  --   ALBUMIN 3.3* 2.6* 2.8*   Recent Labs    11/23/22 0000 12/08/22 0920 12/10/22 0711 01/29/23 0000  WBC 8.8 10.6* 9.1 6.9  NEUTROABS 4,409.00 5.8  --  2,870.00  HGB 13.1* 12.7* 11.5* 11.5*  HCT 40* 38.0* 35.8* 36*  MCV  --  99.5 100.3*  --   PLT 334  334 314 328   Lab Results  Component Value Date   TSH 2.36 11/23/2022   No results found for: "HGBA1C" Lab Results  Component Value Date   CHOL 121 11/23/2022   HDL 48 11/23/2022   LDLCALC 50 11/23/2022   TRIG 144 11/23/2022    Significant Diagnostic Results in last 30 days:  No results found.  Assessment/Plan  Systolic CHF (HCC) compensated, on Torsemide(does adjusted in the past for on and off cough, congestion, swelling, on higher dose Torsemide, also off Spironolactone presently).  EF 60-65%, BNP 41 01/29/23  Pleural effusion on left  07/2022 pleurx drain removed, 01/25/23 CXR bilateral pleural effusion, cough subsided after 2 days of increased Torsemide. Hx of cardiothoracic surgery for chronic management of pleural effusion.   Hypotension  better, taking reduced dose of Torsemide  CKD (chronic kidney disease) stage 3, GFR 30-59 ml/min (HCC)  Bun/creat 24/1.09 01/29/23  Syncope and collapse  2/2 to hypotension related to multiple diuretics/antihypertensive agents, autonomic dysfunction.   Hypokalemia  Kcl, K 3.7 01/29/23  Senile dementia (HCC) on Donepezil, Memantine, MMSE 2/30 12/09/22  Esophageal reflux taking Pantoprazole, Hgb 11.5 01/29/23  HLD (hyperlipidemia) on Simvastatin  Gout taking Allopurinol  Urinary frequency on Doxazosin  Recurrent depression (HCC)  on Escitalopram, stable.   Hypothyroidism  on Levothyroxine, TSH 2.36 11/23/22   Family/ staff Communication: plan of care reviewed with the patient and charge nurse.   Labs/tests ordered:  none  Time spend 25 minutes.

## 2023-03-28 NOTE — Assessment & Plan Note (Signed)
on Donepezil, Memantine, MMSE 2/30 12/09/22 

## 2023-03-28 NOTE — Assessment & Plan Note (Signed)
taking Allopurinol

## 2023-03-28 NOTE — Assessment & Plan Note (Signed)
uses walker, w/c to go further, taking Tylenol  

## 2023-03-28 NOTE — Assessment & Plan Note (Signed)
on Levothyroxine, TSH 2.36 11/23/22 

## 2023-03-28 NOTE — Assessment & Plan Note (Signed)
 Bun/creat 24/1.09 01/29/23

## 2023-03-28 NOTE — Assessment & Plan Note (Signed)
on Simvastatin

## 2023-03-28 NOTE — Assessment & Plan Note (Signed)
Kcl, K 3.7 01/29/23

## 2023-03-28 NOTE — Assessment & Plan Note (Signed)
on Escitalopram, stable.

## 2023-03-28 NOTE — Assessment & Plan Note (Signed)
 taking Pantoprazole, Hgb 11.5 01/29/23

## 2023-03-28 NOTE — Assessment & Plan Note (Signed)
compensated, on Torsemide(does adjusted in the past for on and off cough, congestion, swelling, on higher dose Torsemide, also off Spironolactone presently).  EF 60-65%, BNP 41 01/29/23

## 2023-03-28 NOTE — Assessment & Plan Note (Signed)
 07/2022 pleurx drain removed, 01/25/23 CXR bilateral pleural effusion, cough subsided after 2 days of increased Torsemide. Hx of cardiothoracic surgery for chronic management of pleural effusion.

## 2023-03-28 NOTE — Assessment & Plan Note (Signed)
2/2 to hypotension related to multiple diuretics/antihypertensive agents, autonomic dysfunction.

## 2023-04-01 DIAGNOSIS — N183 Chronic kidney disease, stage 3 unspecified: Secondary | ICD-10-CM | POA: Diagnosis not present

## 2023-04-02 DIAGNOSIS — N183 Chronic kidney disease, stage 3 unspecified: Secondary | ICD-10-CM | POA: Diagnosis not present

## 2023-04-02 DIAGNOSIS — I5022 Chronic systolic (congestive) heart failure: Secondary | ICD-10-CM | POA: Diagnosis not present

## 2023-04-02 DIAGNOSIS — F331 Major depressive disorder, recurrent, moderate: Secondary | ICD-10-CM | POA: Diagnosis not present

## 2023-04-02 DIAGNOSIS — Z8673 Personal history of transient ischemic attack (TIA), and cerebral infarction without residual deficits: Secondary | ICD-10-CM | POA: Diagnosis not present

## 2023-04-02 DIAGNOSIS — I679 Cerebrovascular disease, unspecified: Secondary | ICD-10-CM | POA: Diagnosis not present

## 2023-04-02 DIAGNOSIS — F02A11 Dementia in other diseases classified elsewhere, mild, with agitation: Secondary | ICD-10-CM | POA: Diagnosis not present

## 2023-04-03 DIAGNOSIS — Z8673 Personal history of transient ischemic attack (TIA), and cerebral infarction without residual deficits: Secondary | ICD-10-CM | POA: Diagnosis not present

## 2023-04-03 DIAGNOSIS — I679 Cerebrovascular disease, unspecified: Secondary | ICD-10-CM | POA: Diagnosis not present

## 2023-04-03 DIAGNOSIS — F331 Major depressive disorder, recurrent, moderate: Secondary | ICD-10-CM | POA: Diagnosis not present

## 2023-04-03 DIAGNOSIS — N183 Chronic kidney disease, stage 3 unspecified: Secondary | ICD-10-CM | POA: Diagnosis not present

## 2023-04-03 DIAGNOSIS — I5022 Chronic systolic (congestive) heart failure: Secondary | ICD-10-CM | POA: Diagnosis not present

## 2023-04-03 DIAGNOSIS — F02A11 Dementia in other diseases classified elsewhere, mild, with agitation: Secondary | ICD-10-CM | POA: Diagnosis not present

## 2023-04-04 DIAGNOSIS — F02A11 Dementia in other diseases classified elsewhere, mild, with agitation: Secondary | ICD-10-CM | POA: Diagnosis not present

## 2023-04-04 DIAGNOSIS — I5022 Chronic systolic (congestive) heart failure: Secondary | ICD-10-CM | POA: Diagnosis not present

## 2023-04-04 DIAGNOSIS — F331 Major depressive disorder, recurrent, moderate: Secondary | ICD-10-CM | POA: Diagnosis not present

## 2023-04-04 DIAGNOSIS — Z8673 Personal history of transient ischemic attack (TIA), and cerebral infarction without residual deficits: Secondary | ICD-10-CM | POA: Diagnosis not present

## 2023-04-04 DIAGNOSIS — N183 Chronic kidney disease, stage 3 unspecified: Secondary | ICD-10-CM | POA: Diagnosis not present

## 2023-04-04 DIAGNOSIS — I679 Cerebrovascular disease, unspecified: Secondary | ICD-10-CM | POA: Diagnosis not present

## 2023-04-05 DIAGNOSIS — F02A11 Dementia in other diseases classified elsewhere, mild, with agitation: Secondary | ICD-10-CM | POA: Diagnosis not present

## 2023-04-05 DIAGNOSIS — Z8673 Personal history of transient ischemic attack (TIA), and cerebral infarction without residual deficits: Secondary | ICD-10-CM | POA: Diagnosis not present

## 2023-04-05 DIAGNOSIS — F331 Major depressive disorder, recurrent, moderate: Secondary | ICD-10-CM | POA: Diagnosis not present

## 2023-04-05 DIAGNOSIS — I5022 Chronic systolic (congestive) heart failure: Secondary | ICD-10-CM | POA: Diagnosis not present

## 2023-04-05 DIAGNOSIS — N183 Chronic kidney disease, stage 3 unspecified: Secondary | ICD-10-CM | POA: Diagnosis not present

## 2023-04-05 DIAGNOSIS — I679 Cerebrovascular disease, unspecified: Secondary | ICD-10-CM | POA: Diagnosis not present

## 2023-04-08 DIAGNOSIS — N183 Chronic kidney disease, stage 3 unspecified: Secondary | ICD-10-CM | POA: Diagnosis not present

## 2023-04-09 DIAGNOSIS — I679 Cerebrovascular disease, unspecified: Secondary | ICD-10-CM | POA: Diagnosis not present

## 2023-04-09 DIAGNOSIS — I5022 Chronic systolic (congestive) heart failure: Secondary | ICD-10-CM | POA: Diagnosis not present

## 2023-04-09 DIAGNOSIS — Z8673 Personal history of transient ischemic attack (TIA), and cerebral infarction without residual deficits: Secondary | ICD-10-CM | POA: Diagnosis not present

## 2023-04-09 DIAGNOSIS — F331 Major depressive disorder, recurrent, moderate: Secondary | ICD-10-CM | POA: Diagnosis not present

## 2023-04-09 DIAGNOSIS — F02A11 Dementia in other diseases classified elsewhere, mild, with agitation: Secondary | ICD-10-CM | POA: Diagnosis not present

## 2023-04-09 DIAGNOSIS — N183 Chronic kidney disease, stage 3 unspecified: Secondary | ICD-10-CM | POA: Diagnosis not present

## 2023-04-10 DIAGNOSIS — N183 Chronic kidney disease, stage 3 unspecified: Secondary | ICD-10-CM | POA: Diagnosis not present

## 2023-04-11 ENCOUNTER — Encounter: Payer: Self-pay | Admitting: Sports Medicine

## 2023-04-11 ENCOUNTER — Non-Acute Institutional Stay (SKILLED_NURSING_FACILITY): Payer: Medicare Other | Admitting: Sports Medicine

## 2023-04-11 DIAGNOSIS — E876 Hypokalemia: Secondary | ICD-10-CM | POA: Diagnosis not present

## 2023-04-11 DIAGNOSIS — K219 Gastro-esophageal reflux disease without esophagitis: Secondary | ICD-10-CM | POA: Diagnosis not present

## 2023-04-11 DIAGNOSIS — I5022 Chronic systolic (congestive) heart failure: Secondary | ICD-10-CM

## 2023-04-11 DIAGNOSIS — R1310 Dysphagia, unspecified: Secondary | ICD-10-CM | POA: Diagnosis not present

## 2023-04-11 DIAGNOSIS — F03B0 Unspecified dementia, moderate, without behavioral disturbance, psychotic disturbance, mood disturbance, and anxiety: Secondary | ICD-10-CM

## 2023-04-11 DIAGNOSIS — M1A9XX0 Chronic gout, unspecified, without tophus (tophi): Secondary | ICD-10-CM | POA: Diagnosis not present

## 2023-04-11 DIAGNOSIS — E039 Hypothyroidism, unspecified: Secondary | ICD-10-CM | POA: Diagnosis not present

## 2023-04-11 NOTE — Progress Notes (Unsigned)
Location:  [facility] Room Number: 61A Place of Service:  SNF [phone]) Provider:  [name], MD  Patient Care Team: Mahlon Gammon, MD as PCP - General (Internal Medicine) Bensimhon, Bevelyn Buckles, MD as PCP - Advanced Heart Failure (Cardiology) Georgann Housekeeper, MD as Consulting Physician (Internal Medicine)  Extended Emergency Contact Information Primary Emergency Contact: Crass,[name] Mobile Phone: [phone] Relation: Son Secondary Emergency Contact: Adriano,[name] Mobile Phone: [phone] Relation: Son  Code Status:  DNR Goals of care: Advanced Directive information    04/11/2023   11:07 AM  Advanced Directives  Does Patient Have a Medical Advance Directive? Yes  Type of Estate agent of [place];Living will;Out of facility DNR (pink MOST or yellow form)  Does patient want to make changes to medical advance directive? No - Patient declined  Copy of Healthcare Power of Attorney in Chart? Yes - validated most recent copy scanned in chart (See row information)     Chief Complaint  Patient presents with   Medical Management of Chronic Issues    Patient is being seen for a routine visit    Immunizations    Patient is due foe flu , shingles and covid vaccine     HPI:  Pt is a 89 y.o. male seen today for medical management of chronic diseases.   Pt seen and examined in his room, he is siting comfortably in his wheel chair. Pt was evaluated by speech therapy sec to dysphagia. Staff reported that he is having difficulty clearing his oral cavity and residue with mech soft foods. His diet was changed to puree as per Speech therapy. No concerns as per nursing staff  He is complete dependent all his ADLS  Sleeping fine No agitated noted.     Past Medical History:  Diagnosis Date   Actinic keratoses    Arthritis    CHF (congestive heart failure) (HCC)    Chronic bilateral pleural effusions     Chronic sinusitis    Colon polyps    Dyspnea 07/13/2013   BNP normal   Edema 07/13/2013   GERD (gastroesophageal reflux disease)    omeprazole 1-2 times daily   H/O TIA (transient ischemic attack) and stroke    on statins and plavix -saw Dr.Sethi in the past   Headache    History of BPH    History of TIA (transient ischemic attack) 07/13/2013   Left inguinal hernia    Left inguinal hernia 01/27/2019   Lumbar radiculopathy    Need for shingles vaccine    Optic neuritis 10/2015   left eye   Perennial allergic rhinitis    Polymyalgia rheumatica (HCC) 07/13/2013   Daily prednisone   Restless leg syndrome    Retinal disease    right eye since birth    Shingles    Temporal arteritis (HCC) 10/2015   Wears glasses    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   Past Surgical History:  Procedure Laterality Date   BRAVO PH STUDY N/A 04/18/2016   Procedure: BRAVO PH STUDY;  Surgeon: Charlott Rakes, MD;  Location: [facility];  Service: Endoscopy;  Laterality: N/A;   CHEST TUBE INSERTION Right 02/13/2018   Procedure: INSERTION PLEURAL DRAINAGE CATHETER;  Surgeon: Kerin Perna, MD;  Location: [facility] OR;  Service: Thoracic;  Laterality: Right;   CHEST TUBE INSERTION Left 11/27/2019   Procedure: REDO INSERTION PLEURAL DRAINAGE CATHETER USING 15.5 FR. PLEURX CATH;  Surgeon: Donata Clay, Theron Arista, MD;  Location: [facility] OR;  Service: Thoracic;  Laterality: Left;   COLONOSCOPY     drropy eyelid surgery     ESOPHAGOGASTRODUODENOSCOPY (EGD) WITH PROPOFOL N/A 04/18/2016   Procedure: ESOPHAGOGASTRODUODENOSCOPY (EGD) WITH PROPOFOL;  Surgeon: Charlott Rakes, MD;  [facility]: [facility];  Service: Endoscopy;  Laterality: N/A;   INGUINAL HERNIA REPAIR Left 01/27/2019   Procedure: OPEN REPAIR LEFT INGUINAL HERNIA WITH MESH;  Surgeon: Claud Kelp, MD;  Location: [facility] OR;  Service: General;  Laterality: Left;  GENERAL AND TAP BLOCK ANESTHESIA   INSERTION OF MESH Left 01/27/2019   Procedure: Insertion Of Mesh;  Surgeon: Claud Kelp, MD;  Location: [facility] OR;  Service: General;  Laterality: Left;   IR THORACENTESIS ASP PLEURAL SPACE W/IMG GUIDE  02/04/2018   IR THORACENTESIS ASP PLEURAL SPACE W/IMG GUIDE  02/06/2018   REMOVAL OF PLEURAL DRAINAGE CATHETER Left 11/27/2019   Procedure: REMOVAL OF PLEURAL DRAINAGE CATHETER;  Surgeon: Kerin Perna, MD;  Location: [facility] OR;  Service: Thoracic;  Laterality: Left;   REMOVAL OF PLEURAL DRAINAGE CATHETER Left 03/31/2020   Procedure: REMOVAL OF PLEURAL DRAINAGE CATHETER;  Surgeon: Kerin Perna, MD;  Location: [facility] OR;  Service: Thoracic;  Laterality: Left;   RIGHT/LEFT HEART CATH AND CORONARY ANGIOGRAPHY N/A 06/24/2018   Procedure: RIGHT/LEFT HEART CATH AND CORONARY ANGIOGRAPHY;  Surgeon: Dolores Patty, MD;  [facility]: [facility];  Service: Cardiovascular;  Laterality: N/A;   VASECTOMY      No Known Allergies  Outpatient Encounter Medications as of 04/11/2023  Medication Sig   allopurinol (ZYLOPRIM) 300 MG tablet Take 150 mg by mouth in the morning. For gout   aspirin 81 MG chewable tablet Chew 81 mg by mouth in the morning. Chew and swallow tablet   escitalopram (LEXAPRO) 10 MG tablet TAKE 1 TABLET BY MOUTH DAILY   ipratropium-albuterol (DUONEB) 0.5-2.5 (3) MG/3ML SOLN Take 3 mLs by nebulization in the morning and at bedtime.   levothyroxine (SYNTHROID) 25 MCG tablet Take 25 mcg by mouth daily before breakfast.   omeprazole (PRILOSEC) 20 MG capsule Take 20 mg by mouth daily.   potassium chloride SA (KLOR-CON M) 20 MEQ tablet Take 40 mEq by mouth daily.   tamsulosin (FLOMAX) 0.4 MG CAPS capsule Take 0.4 mg by mouth daily.   Torsemide 40 MG TABS Take 1 tablet by mouth daily.   Torsemide 40 MG TABS Take 1 tablet by mouth 2 (two) times a week. Monday & Tuesday   [DISCONTINUED] potassium chloride (KLOR-CON) 10 MEQ tablet Take 10 mEq by mouth 2 (two) times a week. Monday & Tuesday   acetaminophen (TYLENOL) 500 MG tablet Take 2 tablets (1,000 mg total) by mouth in the  morning and at bedtime. (Patient not taking: Reported on 04/11/2023)   calcium carbonate (CALCIUM 600) 600 MG TABS tablet Take 600 mg by mouth 2 (two) times daily. (Patient not taking: Reported on 04/11/2023)   Cholecalciferol (VITAMIN D3) 50 MCG (2000 UT) TABS Take 1 tablet by mouth daily. (Patient not taking: Reported on 04/11/2023)   donepezil (ARICEPT ODT) 5 MG disintegrating tablet Take 1 tablet (5 mg total) by mouth at bedtime. For co++ + (Patient not taking: Reported on 04/11/2023)   doxazosin (CARDURA) 4 MG tablet Take 4 mg by mouth daily. (Patient not taking: Reported on 04/11/2023)   memantine (NAMENDA) 10 MG tablet TAKE 1 TABLET BY MOUTH TWICE  DAILY (Patient not taking: Reported on 04/11/2023)   [DISCONTINUED] pantoprazole (PROTONIX) 20 MG tablet Take 20 mg by mouth daily.   [DISCONTINUED] simvastatin (  ZOCOR) 20 MG tablet Take 20 mg by mouth every evening. (Patient not taking: Reported on 04/11/2023)   No facility-administered encounter medications on file as of 04/11/2023.    Review of Systems  Unable to perform ROS: Dementia  Constitutional:  Negative for fever.  Respiratory:  Negative for cough.   Gastrointestinal:  Negative for diarrhea.  Genitourinary:  Negative for hematuria.  Psychiatric/Behavioral:  Negative for agitation.     Immunization History  Administered Date(s) Administered   Fluad Quad(high Dose 65+) 04/27/2021, 05/16/2022   Influenza, High Dose Seasonal PF 04/15/2017, 05/16/2018   Influenza,inj,Quad PF,6+ Mos 04/23/2015   Moderna SARS-COV2 Booster Vaccination 12/28/2020, 04/20/2021   Moderna Sars-Covid-2 Vaccination 07/26/2019, 08/26/2019, 06/06/2020   PPD Test 12/13/2022   Pneumococcal Conjugate-13 02/14/2016, 07/23/2016   Pneumococcal Polysaccharide-23 11/13/2012   Td 01/29/2006   Tdap 08/07/2018   Zoster Recombinant(Shingrix) 02/18/2019   Zoster, Live 02/18/2019, 07/01/2019   Pertinent  Health Maintenance Due  Topic Date Due   INFLUENZA VACCINE   02/21/2023      06/25/2022    2:44 AM 08/08/2022   10:15 AM 12/14/2022   10:26 AM 12/25/2022   10:14 AM 01/21/2023   10:00 AM  Fall Risk  Falls in the past year?  0 1 1 1   Was there an injury with Fall?  0 1 1 1   Fall Risk Category Calculator  0 3 3 3   (RETIRED) Patient Fall Risk Level High fall risk      Patient at Risk for Falls Due to  No Fall Risks History of fall(s);Impaired balance/gait;Impaired mobility;Impaired vision History of fall(s);Impaired balance/gait;Impaired mobility;Impaired vision History of fall(s);Impaired balance/gait;Impaired mobility  Fall risk Follow up  Falls evaluation completed Falls prevention discussed;Falls evaluation completed Falls evaluation completed Falls evaluation completed   Functional Status Survey:    Vitals:   04/11/23 1105  BP: 96/72  Pulse: 84  Resp: 18  Temp: 98 F (36.7 C)  TempSrc: Temporal  SpO2: 96%  Weight: 132 lb 8 oz (60.1 kg)  Height: 5\' 9"  (1.753 m)   Body mass index is 19.57 kg/m. Physical Exam Constitutional:      Appearance: Normal appearance.  Cardiovascular:     Rate and Rhythm: Normal rate and regular rhythm.     Pulses: Normal pulses.     Heart sounds: Normal heart sounds.  Pulmonary:     Effort: Pulmonary effort is normal.     Breath sounds: Normal breath sounds.  Abdominal:     General: There is no distension.     Palpations: Abdomen is soft.     Tenderness: There is no abdominal tenderness. There is no guarding.  Neurological:     Mental Status: He is alert. Mental status is at baseline.     Labs reviewed: Recent Labs    12/08/22 0920 12/08/22 1135 12/08/22 1559 12/08/22 2145 12/09/22 0220 12/10/22 0711 01/29/23 0000  NA 134*  --   --   --  135 144 140  K 2.6*  --   --    < > 3.3* 4.4 3.7  CL 96*  --   --   --  103 114* 106  CO2 25  --   --   --  24 24 27*  GLUCOSE 107*  --   --   --  106* 100*  --   BUN 51*  --   --   --  34* 24* 24*  CREATININE 1.48*  --   --   --  1.15 0.99  1.1  CALCIUM  8.6*  --   --   --  8.3* 8.1* 8.0*  MG  --  2.4  --   --   --   --   --   PHOS  --   --  3.4  --   --   --   --    < > = values in this interval not displayed.   Recent Labs    12/08/22 0920 12/10/22 0711 01/29/23 0000  AST 23 17 13*  ALT 13 12 5*  ALKPHOS 53 45 53  BILITOT 0.5 0.4  --   PROT 6.5 5.6*  --   ALBUMIN 3.3* 2.6* 2.8*   Recent Labs    11/23/22 0000 12/08/22 0920 12/10/22 0711 01/29/23 0000  WBC 8.8 10.6* 9.1 6.9  NEUTROABS 4,409.00 5.8  --  2,870.00  HGB 13.1* 12.7* 11.5* 11.5*  HCT 40* 38.0* 35.8* 36*  MCV  --  99.5 100.3*  --   PLT 334 334 314 328   Lab Results  Component Value Date   TSH 2.36 11/23/2022   No results found for: "HGBA1C" Lab Results  Component Value Date   CHOL 121 11/23/2022   HDL 48 11/23/2022   LDLCALC 50 11/23/2022   TRIG 144 11/23/2022    Significant Diagnostic Results in last 30 days:  No results found.  Assessment/Plan  1. Chronic systolic congestive heart failure (HCC) Rales at lung bases  Lower extremity swelling -minimal  Cont with torsemide, potassium supplements  2. Hypokalemia K 3.5 Cont with potassium supplements    3. Gastroesophageal reflux disease without esophagitis Cont with omeprazole   4. Chronic gout without tophus, unspecified cause, unspecified site No recent flare ups Cont with allopurinol  5. Acquired hypothyroidism Tsh 2.84 from 7/25  Cont with levothyroxine  6. Moderate dementia without behavioral disturbance, psychotic disturbance, mood disturbance, or anxiety, unspecified dementia type (HCC) Stable  No behavioral changes  7. Dysphagia, unspecified type Follow up with speech therapy  Cont with pureed diet  Other orders - tamsulosin (FLOMAX) 0.4 MG CAPS capsule; Take 0.4 mg by mouth daily. - omeprazole (PRILOSEC) 20 MG capsule; Take 20 mg by mouth daily.    Family/ staff Communication: care plan discussed with the nurse  Labs/tests ordered:  none

## 2023-04-13 DIAGNOSIS — F331 Major depressive disorder, recurrent, moderate: Secondary | ICD-10-CM | POA: Diagnosis not present

## 2023-04-13 DIAGNOSIS — I5022 Chronic systolic (congestive) heart failure: Secondary | ICD-10-CM | POA: Diagnosis not present

## 2023-04-13 DIAGNOSIS — I679 Cerebrovascular disease, unspecified: Secondary | ICD-10-CM | POA: Diagnosis not present

## 2023-04-13 DIAGNOSIS — Z8673 Personal history of transient ischemic attack (TIA), and cerebral infarction without residual deficits: Secondary | ICD-10-CM | POA: Diagnosis not present

## 2023-04-13 DIAGNOSIS — N183 Chronic kidney disease, stage 3 unspecified: Secondary | ICD-10-CM | POA: Diagnosis not present

## 2023-04-13 DIAGNOSIS — F02A11 Dementia in other diseases classified elsewhere, mild, with agitation: Secondary | ICD-10-CM | POA: Diagnosis not present

## 2023-04-15 DIAGNOSIS — N183 Chronic kidney disease, stage 3 unspecified: Secondary | ICD-10-CM | POA: Diagnosis not present

## 2023-04-16 DIAGNOSIS — N183 Chronic kidney disease, stage 3 unspecified: Secondary | ICD-10-CM | POA: Diagnosis not present

## 2023-04-16 DIAGNOSIS — Z8673 Personal history of transient ischemic attack (TIA), and cerebral infarction without residual deficits: Secondary | ICD-10-CM | POA: Diagnosis not present

## 2023-04-16 DIAGNOSIS — I5022 Chronic systolic (congestive) heart failure: Secondary | ICD-10-CM | POA: Diagnosis not present

## 2023-04-16 DIAGNOSIS — F331 Major depressive disorder, recurrent, moderate: Secondary | ICD-10-CM | POA: Diagnosis not present

## 2023-04-16 DIAGNOSIS — F02A11 Dementia in other diseases classified elsewhere, mild, with agitation: Secondary | ICD-10-CM | POA: Diagnosis not present

## 2023-04-16 DIAGNOSIS — I679 Cerebrovascular disease, unspecified: Secondary | ICD-10-CM | POA: Diagnosis not present

## 2023-04-18 DIAGNOSIS — Z8673 Personal history of transient ischemic attack (TIA), and cerebral infarction without residual deficits: Secondary | ICD-10-CM | POA: Diagnosis not present

## 2023-04-18 DIAGNOSIS — I679 Cerebrovascular disease, unspecified: Secondary | ICD-10-CM | POA: Diagnosis not present

## 2023-04-18 DIAGNOSIS — I5022 Chronic systolic (congestive) heart failure: Secondary | ICD-10-CM | POA: Diagnosis not present

## 2023-04-18 DIAGNOSIS — F02A11 Dementia in other diseases classified elsewhere, mild, with agitation: Secondary | ICD-10-CM | POA: Diagnosis not present

## 2023-04-18 DIAGNOSIS — N183 Chronic kidney disease, stage 3 unspecified: Secondary | ICD-10-CM | POA: Diagnosis not present

## 2023-04-18 DIAGNOSIS — F331 Major depressive disorder, recurrent, moderate: Secondary | ICD-10-CM | POA: Diagnosis not present

## 2023-04-19 DIAGNOSIS — I5022 Chronic systolic (congestive) heart failure: Secondary | ICD-10-CM | POA: Diagnosis not present

## 2023-04-19 DIAGNOSIS — F02A11 Dementia in other diseases classified elsewhere, mild, with agitation: Secondary | ICD-10-CM | POA: Diagnosis not present

## 2023-04-19 DIAGNOSIS — N183 Chronic kidney disease, stage 3 unspecified: Secondary | ICD-10-CM | POA: Diagnosis not present

## 2023-04-19 DIAGNOSIS — Z8673 Personal history of transient ischemic attack (TIA), and cerebral infarction without residual deficits: Secondary | ICD-10-CM | POA: Diagnosis not present

## 2023-04-19 DIAGNOSIS — F331 Major depressive disorder, recurrent, moderate: Secondary | ICD-10-CM | POA: Diagnosis not present

## 2023-04-19 DIAGNOSIS — I679 Cerebrovascular disease, unspecified: Secondary | ICD-10-CM | POA: Diagnosis not present

## 2023-04-22 DIAGNOSIS — N183 Chronic kidney disease, stage 3 unspecified: Secondary | ICD-10-CM | POA: Diagnosis not present

## 2023-04-23 ENCOUNTER — Encounter: Payer: Self-pay | Admitting: Nurse Practitioner

## 2023-04-23 ENCOUNTER — Non-Acute Institutional Stay (SKILLED_NURSING_FACILITY): Payer: Medicare Other | Admitting: Nurse Practitioner

## 2023-04-23 DIAGNOSIS — R0902 Hypoxemia: Secondary | ICD-10-CM | POA: Diagnosis not present

## 2023-04-23 DIAGNOSIS — E039 Hypothyroidism, unspecified: Secondary | ICD-10-CM | POA: Diagnosis not present

## 2023-04-23 DIAGNOSIS — F02A11 Dementia in other diseases classified elsewhere, mild, with agitation: Secondary | ICD-10-CM | POA: Diagnosis not present

## 2023-04-23 DIAGNOSIS — M1991 Primary osteoarthritis, unspecified site: Secondary | ICD-10-CM | POA: Diagnosis not present

## 2023-04-23 DIAGNOSIS — Z8673 Personal history of transient ischemic attack (TIA), and cerebral infarction without residual deficits: Secondary | ICD-10-CM | POA: Diagnosis not present

## 2023-04-23 DIAGNOSIS — R627 Adult failure to thrive: Secondary | ICD-10-CM | POA: Diagnosis not present

## 2023-04-23 DIAGNOSIS — M109 Gout, unspecified: Secondary | ICD-10-CM | POA: Diagnosis not present

## 2023-04-23 DIAGNOSIS — I5022 Chronic systolic (congestive) heart failure: Secondary | ICD-10-CM | POA: Diagnosis not present

## 2023-04-23 DIAGNOSIS — N400 Enlarged prostate without lower urinary tract symptoms: Secondary | ICD-10-CM | POA: Diagnosis not present

## 2023-04-23 DIAGNOSIS — I679 Cerebrovascular disease, unspecified: Secondary | ICD-10-CM | POA: Diagnosis not present

## 2023-04-23 DIAGNOSIS — F331 Major depressive disorder, recurrent, moderate: Secondary | ICD-10-CM | POA: Diagnosis not present

## 2023-04-23 DIAGNOSIS — F0390 Unspecified dementia without behavioral disturbance: Secondary | ICD-10-CM

## 2023-04-23 DIAGNOSIS — R55 Syncope and collapse: Secondary | ICD-10-CM | POA: Diagnosis not present

## 2023-04-23 DIAGNOSIS — N183 Chronic kidney disease, stage 3 unspecified: Secondary | ICD-10-CM | POA: Diagnosis not present

## 2023-04-23 DIAGNOSIS — K219 Gastro-esophageal reflux disease without esophagitis: Secondary | ICD-10-CM | POA: Diagnosis not present

## 2023-04-23 NOTE — Assessment & Plan Note (Signed)
No s/s of fluid retention, dc all po meds.

## 2023-04-23 NOTE — Assessment & Plan Note (Signed)
Symptomatic management, prn Morphine for comfort care.

## 2023-04-23 NOTE — Progress Notes (Unsigned)
Location:  [facility] Room Number: 61-A Place of Service:  SNF (31) Provider:  Yanis Larin X, NP    Patient Care Team: Mahlon Gammon, MD as PCP - General (Internal Medicine) Bensimhon, Bevelyn Buckles, MD as PCP - Advanced Heart Failure (Cardiology) Georgann Housekeeper, MD as Consulting Physician (Internal Medicine)  Extended Emergency Contact Information Primary Emergency Contact: Camberos,[name] Mobile Phone: [phone] Relation: Son Secondary Emergency Contact: Loren,[name] Mobile Phone: [phone] Relation: Son  Code Status:  DNR Goals of care: Advanced Directive information    04/23/2023   12:44 PM  Advanced Directives  Does Patient Have a Medical Advance Directive? Yes  Type of Estate agent of [place];Living will;Out of facility DNR (pink MOST or yellow form)  Does patient want to make changes to medical advance directive? No - Patient declined  Copy of Healthcare Power of Attorney in Chart? Yes - validated most recent copy scanned in chart (See row information)     Chief Complaint  Patient presents with  . Acute Visit    end of life care    HPI:  Pt is a 89 y.o. male seen today for an acute visit for end of life care, the patient opens his eyes when touched and talked to, no facial grimaces during my examination, noted increased RR and O2 desaturation. Goal of care is comfort measures.   CHF, no apparent dependent edema on Torsemide(does adjusted in the past for on and off cough, congestion, swelling, on higher dose Torsemide, also off Spironolactone presently). EF 60-65%, BNP 41 01/29/23    Dementia, on Donepezil, Memantine, MMSE 2/30 12/09/22   Past Medical History:  Diagnosis Date  . Actinic keratoses   . Arthritis   . CHF (congestive heart failure) (HCC)   . Chronic bilateral pleural effusions   . Chronic sinusitis   . Colon polyps   . Dyspnea 07/13/2013   BNP normal  . Edema 07/13/2013  . GERD (gastroesophageal  reflux disease)    omeprazole 1-2 times daily  . H/O TIA (transient ischemic attack) and stroke    on statins and plavix -saw Dr.Sethi in the past  . Headache   . History of BPH   . History of TIA (transient ischemic attack) 07/13/2013  . Left inguinal hernia   . Left inguinal hernia 01/27/2019  . Lumbar radiculopathy   . Need for shingles vaccine   . Optic neuritis 10/2015   left eye  . Perennial allergic rhinitis   . Polymyalgia rheumatica (HCC) 07/13/2013   Daily prednisone  . Restless leg syndrome   . Retinal disease    right eye since birth   . Shingles   . Temporal arteritis (HCC) 10/2015  . Wears glasses   . 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 BRAVO PH STUDY N/A 04/18/2016   Procedure: BRAVO PH STUDY;  Surgeon: Charlott Rakes, MD;  [facility]: [facility];  Service: Endoscopy;  Laterality: N/A;  . CHEST TUBE INSERTION Right 02/13/2018   Procedure: INSERTION PLEURAL DRAINAGE CATHETER;  Surgeon: Kerin Perna, MD;  Location: [facility] OR;  Service: Thoracic;  Laterality: Right;  . CHEST TUBE INSERTION Left 11/27/2019   Procedure: REDO INSERTION PLEURAL DRAINAGE CATHETER USING 15.5 FR. PLEURX CATH;  Surgeon: Donata Clay, Theron Arista, MD;  Location: [facility] OR;  Service: Thoracic;  Laterality: Left;  . COLONOSCOPY    . drropy eyelid surgery    . ESOPHAGOGASTRODUODENOSCOPY (EGD) WITH PROPOFOL N/A 04/18/2016  Procedure: ESOPHAGOGASTRODUODENOSCOPY (EGD) WITH PROPOFOL;  Surgeon: Charlott Rakes, MD;  [facility];  Service: Endoscopy;  Laterality: N/A;  . INGUINAL HERNIA REPAIR Left 01/27/2019   Procedure: OPEN REPAIR LEFT INGUINAL HERNIA WITH MESH;  Surgeon: Claud Kelp, MD;  Location: [facility] OR;  Service: General;  Laterality: Left;  GENERAL AND TAP BLOCK ANESTHESIA  . INSERTION OF MESH Left 01/27/2019   Procedure: Insertion Of Mesh;  Surgeon: Claud Kelp, MD;  Location: [facility] OR;  Service: General;  Laterality: Left;  . IR THORACENTESIS ASP  PLEURAL SPACE W/IMG GUIDE  02/04/2018  . IR THORACENTESIS ASP PLEURAL SPACE W/IMG GUIDE  02/06/2018  . REMOVAL OF PLEURAL DRAINAGE CATHETER Left 11/27/2019   Procedure: REMOVAL OF PLEURAL DRAINAGE CATHETER;  Surgeon: Kerin Perna, MD;  Location: [facility] OR;  Service: Thoracic;  Laterality: Left;  . REMOVAL OF PLEURAL DRAINAGE CATHETER Left 03/31/2020   Procedure: REMOVAL OF PLEURAL DRAINAGE CATHETER;  Surgeon: Kerin Perna, MD;  Location: [facility] OR;  Service: Thoracic;  Laterality: Left;  . RIGHT/LEFT HEART CATH AND CORONARY ANGIOGRAPHY N/A 06/24/2018   Procedure: RIGHT/LEFT HEART CATH AND CORONARY ANGIOGRAPHY;  Surgeon: Dolores Patty, MD;  [facility];  Service: Cardiovascular;  Laterality: N/A;  . VASECTOMY      No Known Allergies  Outpatient Encounter Medications as of 04/23/2023  Medication Sig  . acetaminophen (TYLENOL) 500 MG tablet Take 2 tablets (1,000 mg total) by mouth in the morning and at bedtime.  Marland Kitchen allopurinol (ZYLOPRIM) 300 MG tablet Take 150 mg by mouth in the morning. For gout  . aspirin 81 MG chewable tablet Chew 81 mg by mouth in the morning. Chew and swallow tablet  . escitalopram (LEXAPRO) 10 MG tablet TAKE 1 TABLET BY MOUTH DAILY  . ipratropium-albuterol (DUONEB) 0.5-2.5 (3) MG/3ML SOLN Take 3 mLs by nebulization in the morning and at bedtime.  Marland Kitchen levothyroxine (SYNTHROID) 25 MCG tablet Take 25 mcg by mouth daily before breakfast.  . omeprazole (PRILOSEC) 20 MG capsule Take 20 mg by mouth daily.  . potassium chloride SA (KLOR-CON M) 20 MEQ tablet Take 40 mEq by mouth daily.  . tamsulosin (FLOMAX) 0.4 MG CAPS capsule Take 0.4 mg by mouth daily.  . Torsemide 40 MG TABS Take 1 tablet by mouth daily.  . Torsemide 40 MG TABS Take 1 tablet by mouth 2 (two) times a week. Monday & Tuesday  . [DISCONTINUED] calcium carbonate (CALCIUM 600) 600 MG TABS tablet Take 600 mg by mouth 2 (two) times daily. (Patient not taking: Reported on 04/11/2023)  . [DISCONTINUED]  Cholecalciferol (VITAMIN D3) 50 MCG (2000 UT) TABS Take 1 tablet by mouth daily. (Patient not taking: Reported on 04/11/2023)  . [DISCONTINUED] donepezil (ARICEPT ODT) 5 MG disintegrating tablet Take 1 tablet (5 mg total) by mouth at bedtime. For co++ + (Patient not taking: Reported on 04/11/2023)  . [DISCONTINUED] doxazosin (CARDURA) 4 MG tablet Take 4 mg by mouth daily. (Patient not taking: Reported on 04/11/2023)  . [DISCONTINUED] memantine (NAMENDA) 10 MG tablet TAKE 1 TABLET BY MOUTH TWICE  DAILY (Patient not taking: Reported on 04/11/2023)   No facility-administered encounter medications on file as of 04/23/2023.    Review of Systems  Unable to perform ROS: Dementia    Immunization History  Administered Date(s) Administered  . Fluad Quad(high Dose 65+) 04/27/2021, 05/16/2022  . Influenza, High Dose Seasonal PF 04/15/2017, 05/16/2018  . Influenza,inj,Quad PF,6+ Mos 04/23/2015  . Moderna SARS-COV2 Booster Vaccination 12/28/2020, 04/20/2021  . Moderna Sars-Covid-2 Vaccination  07/26/2019, 08/26/2019, 06/06/2020  . PPD Test 12/13/2022  . Pneumococcal Conjugate-13 02/14/2016, 07/23/2016  . Pneumococcal Polysaccharide-23 11/13/2012  . Td 01/29/2006  . Tdap 08/07/2018  . Zoster Recombinant(Shingrix) 02/18/2019  . Zoster, Live 02/18/2019, 07/01/2019   Pertinent  Health Maintenance Due  Topic Date Due  . INFLUENZA VACCINE  02/21/2023      06/25/2022    2:44 AM 08/08/2022   10:15 AM 12/14/2022   10:26 AM 12/25/2022   10:14 AM 01/21/2023   10:00 AM  Fall Risk  Falls in the past year?  0 1 1 1   Was there an injury with Fall?  0 1 1 1   Fall Risk Category Calculator  0 3 3 3   (RETIRED) Patient Fall Risk Level High fall risk      Patient at Risk for Falls Due to  No Fall Risks History of fall(s);Impaired balance/gait;Impaired mobility;Impaired vision History of fall(s);Impaired balance/gait;Impaired mobility;Impaired vision History of fall(s);Impaired balance/gait;Impaired mobility  Fall risk  Follow up  Falls evaluation completed Falls prevention discussed;Falls evaluation completed Falls evaluation completed Falls evaluation completed   Functional Status Survey:    Vitals:   04/23/23 1236  BP: 107/60  Pulse: 91  Resp: 18  Temp: 98 F (36.7 C)  SpO2: 96%  Weight: 126 lb 6.4 oz (57.3 kg)  Height: 5\' 9"  (1.753 m)   Body mass index is 18.67 kg/m. Physical Exam Constitutional:      Comments: Open eyes when touched or spoke to  HENT:     Head: Normocephalic and atraumatic.     Nose: Nose normal.     Mouth/Throat:     Mouth: Mucous membranes are dry.  Eyes:     Extraocular Movements: Extraocular movements intact.     Conjunctiva/sclera: Conjunctivae normal.     Pupils: Pupils are equal, round, and reactive to light.  Cardiovascular:     Rate and Rhythm: Normal rate.  Pulmonary:     Breath sounds: No wheezing or rales.     Comments: tachypnea Abdominal:     Palpations: Abdomen is soft.  Musculoskeletal:     Cervical back: Normal range of motion and neck supple.     Right lower leg: No edema.     Left lower leg: No edema.  Skin:    Coloration: Skin is not jaundiced or pale.  Neurological:     Mental Status: He is alert.     Cranial Nerves: No cranial nerve deficit.     Comments: Open eyes when touched or spoke to     Labs reviewed: Recent Labs    12/08/22 0920 12/08/22 1135 12/08/22 1559 12/08/22 2145 12/09/22 0220 12/10/22 0711 01/29/23 0000 02/14/23 0000  NA 134*  --   --   --  135 144 140 141  K 2.6*  --   --    < > 3.3* 4.4 3.7 3.5  CL 96*  --   --   --  103 114* 106 107  CO2 25  --   --   --  24 24 27* 28*  GLUCOSE 107*  --   --   --  106* 100*  --   --   BUN 51*  --   --   --  34* 24* 24* 23*  CREATININE 1.48*  --   --   --  1.15 0.99 1.1 1.0  CALCIUM 8.6*  --   --   --  8.3* 8.1* 8.0* 8.1*  MG  --  2.4  --   --   --   --   --   --  PHOS  --   --  3.4  --   --   --   --   --    < > = values in this interval not displayed.   Recent  Labs    12/08/22 0920 12/10/22 0711 01/29/23 0000 02/14/23 0000  AST 23 17 13* 13*  ALT 13 12 5* 7*  ALKPHOS 53 45 53 54  BILITOT 0.5 0.4  --   --   PROT 6.5 5.6*  --   --   ALBUMIN 3.3* 2.6* 2.8* 2.9*   Recent Labs    11/23/22 0000 12/08/22 0920 12/10/22 0711 01/29/23 0000  WBC 8.8 10.6* 9.1 6.9  NEUTROABS 4,409.00 5.8  --  2,870.00  HGB 13.1* 12.7* 11.5* 11.5*  HCT 40* 38.0* 35.8* 36*  MCV  --  99.5 100.3*  --   PLT 334 334 314 328   Lab Results  Component Value Date   TSH 2.84 02/14/2023   No results found for: "HGBA1C" Lab Results  Component Value Date   CHOL 99 02/14/2023   HDL 44 02/14/2023   LDLCALC 37 02/14/2023   TRIG 144 11/23/2022    Significant Diagnostic Results in last 30 days:  No results found.  Assessment/Plan Adult failure to thrive Comfort measures, Morphine is available to him.   Systolic CHF (HCC) No s/s of fluid retention, dc all po meds.   Senile dementia (HCC) Dc all po meds, comfort measures.   Hypoxia Symptomatic management, prn Morphine for comfort care.     Family/ staff Communication: plan of care reviewed with the patient and charge nurse.   Labs/tests ordered:  none  Time spend 35 minutes.

## 2023-04-23 NOTE — Assessment & Plan Note (Signed)
Comfort measures, Morphine is available to him.

## 2023-04-23 NOTE — Assessment & Plan Note (Signed)
Dc all po meds, comfort measures.

## 2023-04-24 DIAGNOSIS — I5022 Chronic systolic (congestive) heart failure: Secondary | ICD-10-CM | POA: Diagnosis not present

## 2023-04-24 DIAGNOSIS — Z8673 Personal history of transient ischemic attack (TIA), and cerebral infarction without residual deficits: Secondary | ICD-10-CM | POA: Diagnosis not present

## 2023-04-24 DIAGNOSIS — N183 Chronic kidney disease, stage 3 unspecified: Secondary | ICD-10-CM | POA: Diagnosis not present

## 2023-04-24 DIAGNOSIS — F331 Major depressive disorder, recurrent, moderate: Secondary | ICD-10-CM | POA: Diagnosis not present

## 2023-04-24 DIAGNOSIS — F02A11 Dementia in other diseases classified elsewhere, mild, with agitation: Secondary | ICD-10-CM | POA: Diagnosis not present

## 2023-04-24 DIAGNOSIS — I679 Cerebrovascular disease, unspecified: Secondary | ICD-10-CM | POA: Diagnosis not present

## 2023-04-25 DIAGNOSIS — F331 Major depressive disorder, recurrent, moderate: Secondary | ICD-10-CM | POA: Diagnosis not present

## 2023-04-25 DIAGNOSIS — N183 Chronic kidney disease, stage 3 unspecified: Secondary | ICD-10-CM | POA: Diagnosis not present

## 2023-04-25 DIAGNOSIS — I5022 Chronic systolic (congestive) heart failure: Secondary | ICD-10-CM | POA: Diagnosis not present

## 2023-04-25 DIAGNOSIS — I679 Cerebrovascular disease, unspecified: Secondary | ICD-10-CM | POA: Diagnosis not present

## 2023-04-25 DIAGNOSIS — Z8673 Personal history of transient ischemic attack (TIA), and cerebral infarction without residual deficits: Secondary | ICD-10-CM | POA: Diagnosis not present

## 2023-04-25 DIAGNOSIS — F02A11 Dementia in other diseases classified elsewhere, mild, with agitation: Secondary | ICD-10-CM | POA: Diagnosis not present

## 2023-04-26 DIAGNOSIS — I5022 Chronic systolic (congestive) heart failure: Secondary | ICD-10-CM | POA: Diagnosis not present

## 2023-04-26 DIAGNOSIS — Z8673 Personal history of transient ischemic attack (TIA), and cerebral infarction without residual deficits: Secondary | ICD-10-CM | POA: Diagnosis not present

## 2023-04-26 DIAGNOSIS — F331 Major depressive disorder, recurrent, moderate: Secondary | ICD-10-CM | POA: Diagnosis not present

## 2023-04-26 DIAGNOSIS — F02A11 Dementia in other diseases classified elsewhere, mild, with agitation: Secondary | ICD-10-CM | POA: Diagnosis not present

## 2023-04-26 DIAGNOSIS — N183 Chronic kidney disease, stage 3 unspecified: Secondary | ICD-10-CM | POA: Diagnosis not present

## 2023-04-26 DIAGNOSIS — I679 Cerebrovascular disease, unspecified: Secondary | ICD-10-CM | POA: Diagnosis not present

## 2023-04-30 ENCOUNTER — Telehealth (HOSPITAL_COMMUNITY): Payer: Self-pay

## 2023-04-30 NOTE — Telephone Encounter (Signed)
pt son called me to inform me that this man passed away- just wanted to pass it along doesn't have any upcoming appts but guess we can change his status in the chart

## 2023-05-24 DEATH — deceased

## 2024-06-25 ENCOUNTER — Encounter (HOSPITAL_COMMUNITY): Payer: Self-pay | Admitting: General Surgery
